# Patient Record
Sex: Female | Born: 1940 | Race: Black or African American | Hispanic: No | Marital: Single | Smoking: Former smoker
Health system: Southern US, Community
[De-identification: ages and names within clinical notes are randomized; demographics above are authoritative.]

## PROBLEM LIST (undated history)

## (undated) DIAGNOSIS — E039 Hypothyroidism, unspecified: Secondary | ICD-10-CM

## (undated) DIAGNOSIS — I10 Essential (primary) hypertension: Secondary | ICD-10-CM

## (undated) DIAGNOSIS — Z5189 Encounter for other specified aftercare: Secondary | ICD-10-CM

## (undated) DIAGNOSIS — R413 Other amnesia: Secondary | ICD-10-CM

## (undated) DIAGNOSIS — D469 Myelodysplastic syndrome, unspecified: Secondary | ICD-10-CM

## (undated) DIAGNOSIS — M1990 Unspecified osteoarthritis, unspecified site: Secondary | ICD-10-CM

## (undated) DIAGNOSIS — D649 Anemia, unspecified: Secondary | ICD-10-CM

## (undated) DIAGNOSIS — K219 Gastro-esophageal reflux disease without esophagitis: Secondary | ICD-10-CM

## (undated) DIAGNOSIS — K922 Gastrointestinal hemorrhage, unspecified: Secondary | ICD-10-CM

## (undated) DIAGNOSIS — E785 Hyperlipidemia, unspecified: Secondary | ICD-10-CM

## (undated) DIAGNOSIS — IMO0001 Reserved for inherently not codable concepts without codable children: Secondary | ICD-10-CM

## (undated) DIAGNOSIS — E559 Vitamin D deficiency, unspecified: Secondary | ICD-10-CM

## (undated) HISTORY — DX: Vitamin D deficiency, unspecified: E55.9

## (undated) HISTORY — DX: Other amnesia: R41.3

## (undated) HISTORY — DX: Hyperlipidemia, unspecified: E78.5

## (undated) HISTORY — DX: Unspecified osteoarthritis, unspecified site: M19.90

## (undated) HISTORY — PX: INGUINAL HERNIA REPAIR: SUR1180

## (undated) HISTORY — PX: UMBILICAL HERNIA REPAIR: SHX196

## (undated) HISTORY — PX: CARPAL TUNNEL RELEASE: SHX101

## (undated) HISTORY — DX: Gastrointestinal hemorrhage, unspecified: K92.2

## (undated) HISTORY — DX: Essential (primary) hypertension: I10

## (undated) HISTORY — PX: ROTATOR CUFF REPAIR: SHX139

## (undated) HISTORY — PX: DILATION AND CURETTAGE OF UTERUS: SHX78

## (undated) HISTORY — DX: Encounter for other specified aftercare: Z51.89

## (undated) HISTORY — DX: Reserved for inherently not codable concepts without codable children: IMO0001

## (undated) HISTORY — DX: Myelodysplastic syndrome, unspecified: D46.9

## (undated) HISTORY — DX: Anemia, unspecified: D64.9

---

## 2009-07-17 ENCOUNTER — Ambulatory Visit: Payer: Self-pay | Admitting: Oncology

## 2009-07-22 LAB — CBC WITH DIFFERENTIAL/PLATELET
Basophils Absolute: 0 10*3/uL (ref 0.0–0.1)
EOS%: 2.9 % (ref 0.0–7.0)
MCHC: 34 g/dL (ref 31.5–36.0)
MCV: 82.8 fL (ref 79.5–101.0)
MONO#: 0.3 10*3/uL (ref 0.1–0.9)
NEUT#: 2.5 10*3/uL (ref 1.5–6.5)
NEUT%: 51.3 % (ref 38.4–76.8)
Platelets: 235 10*3/uL (ref 145–400)
RBC: 3.66 10*6/uL — ABNORMAL LOW (ref 3.70–5.45)
RDW: 16.1 % — ABNORMAL HIGH (ref 11.2–14.5)
WBC: 4.8 10*3/uL (ref 3.9–10.3)

## 2009-07-24 LAB — COMPREHENSIVE METABOLIC PANEL
Albumin: 4.2 g/dL (ref 3.5–5.2)
BUN: 31 mg/dL — ABNORMAL HIGH (ref 6–23)
Calcium: 9.4 mg/dL (ref 8.4–10.5)
Chloride: 107 mEq/L (ref 96–112)
Glucose, Bld: 89 mg/dL (ref 70–99)
Potassium: 4.4 mEq/L (ref 3.5–5.3)

## 2009-07-24 LAB — SPEP & IFE WITH QIG
Albumin ELP: 52.3 % — ABNORMAL LOW (ref 55.8–66.1)
Beta 2: 6.4 % (ref 3.2–6.5)
IgA: 487 mg/dL — ABNORMAL HIGH (ref 68–378)
IgM, Serum: 65 mg/dL (ref 60–263)
Total Protein, Serum Electrophoresis: 8 g/dL (ref 6.0–8.3)

## 2009-07-24 LAB — IRON AND TIBC
Iron: 98 ug/dL (ref 42–145)
UIBC: 146 ug/dL

## 2009-07-29 ENCOUNTER — Encounter: Admission: RE | Admit: 2009-07-29 | Discharge: 2009-07-29 | Payer: Self-pay | Admitting: Family Medicine

## 2009-08-17 ENCOUNTER — Ambulatory Visit: Payer: Self-pay | Admitting: Oncology

## 2009-08-19 LAB — CBC WITH DIFFERENTIAL/PLATELET
Basophils Absolute: 0 10*3/uL (ref 0.0–0.1)
EOS%: 2.3 % (ref 0.0–7.0)
LYMPH%: 39.9 % (ref 14.0–49.7)
MCH: 29.8 pg (ref 25.1–34.0)
MCV: 86.4 fL (ref 79.5–101.0)
MONO%: 5.8 % (ref 0.0–14.0)
Platelets: 214 10*3/uL (ref 145–400)
RBC: 3.71 10*6/uL (ref 3.70–5.45)
RDW: 16.8 % — ABNORMAL HIGH (ref 11.2–14.5)

## 2009-09-14 ENCOUNTER — Ambulatory Visit: Payer: Self-pay | Admitting: Oncology

## 2009-09-16 LAB — CBC WITH DIFFERENTIAL/PLATELET
Basophils Absolute: 0 10*3/uL (ref 0.0–0.1)
EOS%: 3.8 % (ref 0.0–7.0)
Eosinophils Absolute: 0.2 10*3/uL (ref 0.0–0.5)
HCT: 26.7 % — ABNORMAL LOW (ref 34.8–46.6)
HGB: 9.3 g/dL — ABNORMAL LOW (ref 11.6–15.9)
MCH: 30.2 pg (ref 25.1–34.0)
MCV: 87 fL (ref 79.5–101.0)
MONO%: 7.4 % (ref 0.0–14.0)
NEUT%: 50.7 % (ref 38.4–76.8)
Platelets: 189 10*3/uL (ref 145–400)

## 2009-10-14 ENCOUNTER — Ambulatory Visit: Payer: Self-pay | Admitting: Oncology

## 2009-10-14 LAB — CBC WITH DIFFERENTIAL/PLATELET
BASO%: 0.2 % (ref 0.0–2.0)
Basophils Absolute: 0 10*3/uL (ref 0.0–0.1)
EOS%: 2.4 % (ref 0.0–7.0)
Eosinophils Absolute: 0.1 10*3/uL (ref 0.0–0.5)
HCT: 29.8 % — ABNORMAL LOW (ref 34.8–46.6)
HGB: 9.8 g/dL — ABNORMAL LOW (ref 11.6–15.9)
LYMPH%: 36.3 % (ref 14.0–49.7)
MCH: 28.7 pg (ref 25.1–34.0)
MCHC: 32.9 g/dL (ref 31.5–36.0)
MCV: 87.1 fL (ref 79.5–101.0)
MONO#: 0.3 10*3/uL (ref 0.1–0.9)
MONO%: 4.9 % (ref 0.0–14.0)
NEUT#: 3.2 10*3/uL (ref 1.5–6.5)
NEUT%: 56.2 % (ref 38.4–76.8)
Platelets: 222 10*3/uL (ref 145–400)
RBC: 3.42 10*6/uL — ABNORMAL LOW (ref 3.70–5.45)
RDW: 15.5 % — ABNORMAL HIGH (ref 11.2–14.5)
WBC: 5.7 10*3/uL (ref 3.9–10.3)
lymph#: 2.1 10*3/uL (ref 0.9–3.3)
nRBC: 0 % (ref 0–0)

## 2009-11-11 LAB — CBC WITH DIFFERENTIAL/PLATELET
Basophils Absolute: 0 10*3/uL (ref 0.0–0.1)
Eosinophils Absolute: 0.1 10*3/uL (ref 0.0–0.5)
HGB: 10.6 g/dL — ABNORMAL LOW (ref 11.6–15.9)
LYMPH%: 39.8 % (ref 14.0–49.7)
MCV: 87.2 fL (ref 79.5–101.0)
MONO%: 6.5 % (ref 0.0–14.0)
NEUT#: 2.3 10*3/uL (ref 1.5–6.5)
Platelets: 180 10*3/uL (ref 145–400)

## 2009-12-07 ENCOUNTER — Ambulatory Visit: Payer: Self-pay | Admitting: Oncology

## 2009-12-09 LAB — CBC WITH DIFFERENTIAL/PLATELET
Basophils Absolute: 0 10*3/uL (ref 0.0–0.1)
Eosinophils Absolute: 0.1 10*3/uL (ref 0.0–0.5)
HGB: 11.4 g/dL — ABNORMAL LOW (ref 11.6–15.9)
MONO%: 7.4 % (ref 0.0–14.0)
NEUT#: 2.5 10*3/uL (ref 1.5–6.5)
RBC: 3.84 10*6/uL (ref 3.70–5.45)
RDW: 15.1 % — ABNORMAL HIGH (ref 11.2–14.5)
WBC: 5 10*3/uL (ref 3.9–10.3)
lymph#: 2 10*3/uL (ref 0.9–3.3)

## 2009-12-12 ENCOUNTER — Emergency Department (HOSPITAL_COMMUNITY): Admission: EM | Admit: 2009-12-12 | Discharge: 2009-12-13 | Payer: Self-pay | Admitting: Emergency Medicine

## 2010-01-06 ENCOUNTER — Ambulatory Visit: Payer: Self-pay | Admitting: Oncology

## 2010-01-06 LAB — CBC WITH DIFFERENTIAL/PLATELET
BASO%: 0.4 % (ref 0.0–2.0)
EOS%: 2.6 % (ref 0.0–7.0)
HGB: 9.3 g/dL — ABNORMAL LOW (ref 11.6–15.9)
MCH: 29.6 pg (ref 25.1–34.0)
MCHC: 35.1 g/dL (ref 31.5–36.0)
MCV: 84.1 fL (ref 79.5–101.0)
MONO%: 6 % (ref 0.0–14.0)
RBC: 3.15 10*6/uL — ABNORMAL LOW (ref 3.70–5.45)
RDW: 14.5 % (ref 11.2–14.5)
lymph#: 1.5 10*3/uL (ref 0.9–3.3)

## 2010-02-03 LAB — CBC WITH DIFFERENTIAL/PLATELET
BASO%: 0.4 % (ref 0.0–2.0)
EOS%: 2.7 % (ref 0.0–7.0)
HCT: 27.8 % — ABNORMAL LOW (ref 34.8–46.6)
LYMPH%: 37.9 % (ref 14.0–49.7)
MCH: 29.8 pg (ref 25.1–34.0)
MCHC: 34.6 g/dL (ref 31.5–36.0)
MCV: 86.1 fL (ref 79.5–101.0)
MONO%: 7.1 % (ref 0.0–14.0)
NEUT%: 51.9 % (ref 38.4–76.8)
lymph#: 1.7 10*3/uL (ref 0.9–3.3)

## 2010-03-02 ENCOUNTER — Ambulatory Visit: Payer: Self-pay | Admitting: Oncology

## 2010-03-03 ENCOUNTER — Emergency Department (HOSPITAL_COMMUNITY): Admission: EM | Admit: 2010-03-03 | Discharge: 2010-03-03 | Payer: Self-pay | Admitting: Emergency Medicine

## 2010-03-03 LAB — CBC WITH DIFFERENTIAL/PLATELET
Eosinophils Absolute: 0.2 10*3/uL (ref 0.0–0.5)
HCT: 29.7 % — ABNORMAL LOW (ref 34.8–46.6)
HGB: 10.3 g/dL — ABNORMAL LOW (ref 11.6–15.9)
LYMPH%: 47.5 % (ref 14.0–49.7)
MONO#: 0.3 10*3/uL (ref 0.1–0.9)
NEUT#: 2.1 10*3/uL (ref 1.5–6.5)
NEUT%: 42.4 % (ref 38.4–76.8)
Platelets: 232 10*3/uL (ref 145–400)
WBC: 4.9 10*3/uL (ref 3.9–10.3)

## 2010-04-01 ENCOUNTER — Ambulatory Visit: Payer: Self-pay | Admitting: Oncology

## 2010-04-01 LAB — CBC WITH DIFFERENTIAL/PLATELET
BASO%: 0.5 % (ref 0.0–2.0)
Basophils Absolute: 0 10*3/uL (ref 0.0–0.1)
HCT: 31.6 % — ABNORMAL LOW (ref 34.8–46.6)
LYMPH%: 47 % (ref 14.0–49.7)
MCH: 29.7 pg (ref 25.1–34.0)
MCHC: 34.8 g/dL (ref 31.5–36.0)
MONO#: 0.4 10*3/uL (ref 0.1–0.9)
NEUT%: 42.2 % (ref 38.4–76.8)
Platelets: 189 10*3/uL (ref 145–400)

## 2010-04-28 LAB — CBC WITH DIFFERENTIAL/PLATELET
BASO%: 0.4 % (ref 0.0–2.0)
Eosinophils Absolute: 0.1 10*3/uL (ref 0.0–0.5)
HCT: 29.9 % — ABNORMAL LOW (ref 34.8–46.6)
LYMPH%: 37.5 % (ref 14.0–49.7)
MCHC: 34.1 g/dL (ref 31.5–36.0)
MONO#: 0.3 10*3/uL (ref 0.1–0.9)
NEUT%: 52.3 % (ref 38.4–76.8)
Platelets: 198 10*3/uL (ref 145–400)
WBC: 4.1 10*3/uL (ref 3.9–10.3)

## 2010-04-28 LAB — COMPREHENSIVE METABOLIC PANEL
CO2: 20 mEq/L (ref 19–32)
Creatinine, Ser: 1.11 mg/dL (ref 0.40–1.20)
Glucose, Bld: 141 mg/dL — ABNORMAL HIGH (ref 70–99)
Total Bilirubin: 0.5 mg/dL (ref 0.3–1.2)

## 2010-05-24 ENCOUNTER — Ambulatory Visit: Payer: Self-pay | Admitting: Oncology

## 2010-05-26 LAB — CBC WITH DIFFERENTIAL/PLATELET
Basophils Absolute: 0 10*3/uL (ref 0.0–0.1)
Eosinophils Absolute: 0.2 10*3/uL (ref 0.0–0.5)
HCT: 30.3 % — ABNORMAL LOW (ref 34.8–46.6)
HGB: 10.6 g/dL — ABNORMAL LOW (ref 11.6–15.9)
LYMPH%: 50.3 % — ABNORMAL HIGH (ref 14.0–49.7)
MONO#: 0.2 10*3/uL (ref 0.1–0.9)
NEUT#: 1.7 10*3/uL (ref 1.5–6.5)
Platelets: 182 10*3/uL (ref 145–400)
RBC: 3.59 10*6/uL — ABNORMAL LOW (ref 3.70–5.45)
WBC: 4.3 10*3/uL (ref 3.9–10.3)

## 2010-06-24 ENCOUNTER — Ambulatory Visit: Payer: Self-pay | Admitting: Oncology

## 2010-06-24 LAB — CBC WITH DIFFERENTIAL/PLATELET
Eosinophils Absolute: 0.1 10*3/uL (ref 0.0–0.5)
HCT: 29 % — ABNORMAL LOW (ref 34.8–46.6)
LYMPH%: 36.2 % (ref 14.0–49.7)
MCV: 84 fL (ref 79.5–101.0)
MONO#: 0.3 10*3/uL (ref 0.1–0.9)
NEUT#: 2.3 10*3/uL (ref 1.5–6.5)
NEUT%: 54.5 % (ref 38.4–76.8)
Platelets: 229 10*3/uL (ref 145–400)
WBC: 4.2 10*3/uL (ref 3.9–10.3)

## 2010-06-24 LAB — COMPREHENSIVE METABOLIC PANEL
BUN: 32 mg/dL — ABNORMAL HIGH (ref 6–23)
CO2: 22 mEq/L (ref 19–32)
Creatinine, Ser: 1.11 mg/dL (ref 0.40–1.20)
Glucose, Bld: 127 mg/dL — ABNORMAL HIGH (ref 70–99)
Total Bilirubin: 0.5 mg/dL (ref 0.3–1.2)
Total Protein: 7.9 g/dL (ref 6.0–8.3)

## 2010-07-22 LAB — CBC WITH DIFFERENTIAL/PLATELET
BASO%: 0.1 % (ref 0.0–2.0)
Basophils Absolute: 0 10*3/uL (ref 0.0–0.1)
EOS%: 3 % (ref 0.0–7.0)
Eosinophils Absolute: 0.1 10*3/uL (ref 0.0–0.5)
HCT: 28.7 % — ABNORMAL LOW (ref 34.8–46.6)
HGB: 10 g/dL — ABNORMAL LOW (ref 11.6–15.9)
LYMPH%: 37.3 % (ref 14.0–49.7)
MCH: 28.9 pg (ref 25.1–34.0)
MCHC: 34.9 g/dL (ref 31.5–36.0)
MCV: 83 fL (ref 79.5–101.0)
MONO#: 0.4 10*3/uL (ref 0.1–0.9)
MONO%: 8.5 % (ref 0.0–14.0)
NEUT#: 2.2 10*3/uL (ref 1.5–6.5)
NEUT%: 51.1 % (ref 38.4–76.8)
Platelets: 230 10*3/uL (ref 145–400)
RBC: 3.46 10*6/uL — ABNORMAL LOW (ref 3.70–5.45)
RDW: 17.4 % — ABNORMAL HIGH (ref 11.2–14.5)
WBC: 4.3 10*3/uL (ref 3.9–10.3)
lymph#: 1.6 10*3/uL (ref 0.9–3.3)

## 2010-08-17 ENCOUNTER — Ambulatory Visit: Payer: Self-pay | Admitting: Oncology

## 2010-08-19 LAB — CBC WITH DIFFERENTIAL/PLATELET
BASO%: 0.1 % (ref 0.0–2.0)
EOS%: 2.5 % (ref 0.0–7.0)
HCT: 30.2 % — ABNORMAL LOW (ref 34.8–46.6)
MCH: 27.9 pg (ref 25.1–34.0)
MCHC: 34.3 g/dL (ref 31.5–36.0)
MONO#: 0.3 10*3/uL (ref 0.1–0.9)
RBC: 3.71 10*6/uL (ref 3.70–5.45)
RDW: 17.3 % — ABNORMAL HIGH (ref 11.2–14.5)
WBC: 3.9 10*3/uL (ref 3.9–10.3)
lymph#: 1.7 10*3/uL (ref 0.9–3.3)

## 2010-08-23 LAB — COMPREHENSIVE METABOLIC PANEL
ALT: 10 U/L (ref 0–35)
AST: 17 U/L (ref 0–37)
CO2: 23 mEq/L (ref 19–32)
Calcium: 9.3 mg/dL (ref 8.4–10.5)
Chloride: 109 mEq/L (ref 96–112)
Potassium: 4.1 mEq/L (ref 3.5–5.3)
Sodium: 142 mEq/L (ref 135–145)
Total Protein: 7.5 g/dL (ref 6.0–8.3)

## 2010-08-23 LAB — SPEP & IFE WITH QIG
Albumin ELP: 50.2 % — ABNORMAL LOW (ref 55.8–66.1)
Alpha-1-Globulin: 4.3 % (ref 2.9–4.9)
Beta 2: 7 % — ABNORMAL HIGH (ref 3.2–6.5)
Gamma Globulin: 23.1 % — ABNORMAL HIGH (ref 11.1–18.8)
IgA: 380 mg/dL — ABNORMAL HIGH (ref 68–378)
IgM, Serum: 53 mg/dL — ABNORMAL LOW (ref 60–263)

## 2010-08-23 LAB — IRON AND TIBC
%SAT: 42 % (ref 20–55)
TIBC: 233 ug/dL — ABNORMAL LOW (ref 250–470)

## 2010-08-23 LAB — FERRITIN: Ferritin: 428 ng/mL — ABNORMAL HIGH (ref 10–291)

## 2010-09-09 LAB — CBC WITH DIFFERENTIAL/PLATELET
Eosinophils Absolute: 0.1 10*3/uL (ref 0.0–0.5)
HCT: 31.2 % — ABNORMAL LOW (ref 34.8–46.6)
LYMPH%: 42.6 % (ref 14.0–49.7)
MCHC: 33.8 g/dL (ref 31.5–36.0)
MCV: 81.3 fL (ref 79.5–101.0)
MONO%: 5.1 % (ref 0.0–14.0)
NEUT#: 2.2 10*3/uL (ref 1.5–6.5)
NEUT%: 49.4 % (ref 38.4–76.8)
Platelets: 193 10*3/uL (ref 145–400)
RBC: 3.83 10*6/uL (ref 3.70–5.45)

## 2010-09-28 ENCOUNTER — Ambulatory Visit: Payer: Self-pay | Admitting: Oncology

## 2010-09-30 LAB — CBC WITH DIFFERENTIAL/PLATELET
BASO%: 0.1 % (ref 0.0–2.0)
EOS%: 2.7 % (ref 0.0–7.0)
LYMPH%: 36.6 % (ref 14.0–49.7)
MCH: 27.5 pg (ref 25.1–34.0)
MCHC: 34.4 g/dL (ref 31.5–36.0)
MONO#: 0.3 10*3/uL (ref 0.1–0.9)
MONO%: 7.2 % (ref 0.0–14.0)
NEUT%: 53.4 % (ref 38.4–76.8)
Platelets: 216 10*3/uL (ref 145–400)
RBC: 3.4 10*6/uL — ABNORMAL LOW (ref 3.70–5.45)
WBC: 4 10*3/uL (ref 3.9–10.3)

## 2010-10-21 LAB — CBC WITH DIFFERENTIAL/PLATELET
BASO%: 0.2 % (ref 0.0–2.0)
Basophils Absolute: 0 10*3/uL (ref 0.0–0.1)
EOS%: 1.5 % (ref 0.0–7.0)
Eosinophils Absolute: 0.1 10*3/uL (ref 0.0–0.5)
HCT: 30.1 % — ABNORMAL LOW (ref 34.8–46.6)
HGB: 10.3 g/dL — ABNORMAL LOW (ref 11.6–15.9)
LYMPH%: 29.6 % (ref 14.0–49.7)
MCH: 28.1 pg (ref 25.1–34.0)
MCHC: 34.3 g/dL (ref 31.5–36.0)
MCV: 81.8 fL (ref 79.5–101.0)
MONO#: 0.3 10*3/uL (ref 0.1–0.9)
MONO%: 8.2 % (ref 0.0–14.0)
NEUT#: 2.5 10*3/uL (ref 1.5–6.5)
NEUT%: 60.5 % (ref 38.4–76.8)
Platelets: 199 10*3/uL (ref 145–400)
RBC: 3.68 10*6/uL — ABNORMAL LOW (ref 3.70–5.45)
RDW: 17.9 % — ABNORMAL HIGH (ref 11.2–14.5)
WBC: 4.1 10*3/uL (ref 3.9–10.3)
lymph#: 1.2 10*3/uL (ref 0.9–3.3)

## 2010-11-11 ENCOUNTER — Encounter (HOSPITAL_BASED_OUTPATIENT_CLINIC_OR_DEPARTMENT_OTHER): Payer: Medicare Other | Admitting: Oncology

## 2010-11-11 ENCOUNTER — Other Ambulatory Visit: Payer: Self-pay | Admitting: Oncology

## 2010-11-11 DIAGNOSIS — D539 Nutritional anemia, unspecified: Secondary | ICD-10-CM

## 2010-11-11 DIAGNOSIS — N289 Disorder of kidney and ureter, unspecified: Secondary | ICD-10-CM

## 2010-11-11 DIAGNOSIS — D649 Anemia, unspecified: Secondary | ICD-10-CM

## 2010-11-11 LAB — CBC WITH DIFFERENTIAL/PLATELET
Basophils Absolute: 0 10*3/uL (ref 0.0–0.1)
Eosinophils Absolute: 0.1 10*3/uL (ref 0.0–0.5)
HCT: 33.7 % — ABNORMAL LOW (ref 34.8–46.6)
HGB: 11.3 g/dL — ABNORMAL LOW (ref 11.6–15.9)
MCV: 80.4 fL (ref 79.5–101.0)
MONO%: 6.5 % (ref 0.0–14.0)
NEUT#: 2.1 10*3/uL (ref 1.5–6.5)
NEUT%: 51.1 % (ref 38.4–76.8)
RDW: 16.1 % — ABNORMAL HIGH (ref 11.2–14.5)
lymph#: 1.6 10*3/uL (ref 0.9–3.3)

## 2010-12-02 ENCOUNTER — Other Ambulatory Visit: Payer: Self-pay | Admitting: Oncology

## 2010-12-02 ENCOUNTER — Encounter (HOSPITAL_BASED_OUTPATIENT_CLINIC_OR_DEPARTMENT_OTHER): Payer: Medicare Other | Admitting: Oncology

## 2010-12-02 DIAGNOSIS — D649 Anemia, unspecified: Secondary | ICD-10-CM

## 2010-12-02 DIAGNOSIS — N289 Disorder of kidney and ureter, unspecified: Secondary | ICD-10-CM

## 2010-12-02 DIAGNOSIS — D539 Nutritional anemia, unspecified: Secondary | ICD-10-CM

## 2010-12-02 LAB — CBC WITH DIFFERENTIAL/PLATELET
BASO%: 0.3 % (ref 0.0–2.0)
EOS%: 2.8 % (ref 0.0–7.0)
HCT: 28.1 % — ABNORMAL LOW (ref 34.8–46.6)
MCH: 27.9 pg (ref 25.1–34.0)
MCHC: 34.4 g/dL (ref 31.5–36.0)
MONO#: 0.3 10*3/uL (ref 0.1–0.9)
RDW: 15.5 % — ABNORMAL HIGH (ref 11.2–14.5)
WBC: 3.9 10*3/uL (ref 3.9–10.3)
lymph#: 1.8 10*3/uL (ref 0.9–3.3)

## 2010-12-20 LAB — URINALYSIS, ROUTINE W REFLEX MICROSCOPIC
Nitrite: NEGATIVE
Specific Gravity, Urine: 1.019 (ref 1.005–1.030)
Urobilinogen, UA: 0.2 mg/dL (ref 0.0–1.0)
pH: 6 (ref 5.0–8.0)

## 2010-12-20 LAB — URINE MICROSCOPIC-ADD ON

## 2010-12-20 LAB — URINE CULTURE: Colony Count: 40000

## 2010-12-23 ENCOUNTER — Other Ambulatory Visit: Payer: Self-pay | Admitting: Medical

## 2010-12-23 ENCOUNTER — Encounter (HOSPITAL_BASED_OUTPATIENT_CLINIC_OR_DEPARTMENT_OTHER): Payer: Medicare Other | Admitting: Oncology

## 2010-12-23 DIAGNOSIS — N289 Disorder of kidney and ureter, unspecified: Secondary | ICD-10-CM

## 2010-12-23 DIAGNOSIS — D649 Anemia, unspecified: Secondary | ICD-10-CM

## 2010-12-23 LAB — CBC WITH DIFFERENTIAL/PLATELET
BASO%: 0.9 % (ref 0.0–2.0)
HCT: 30.1 % — ABNORMAL LOW (ref 34.8–46.6)
LYMPH%: 54.3 % — ABNORMAL HIGH (ref 14.0–49.7)
MCHC: 34.4 g/dL (ref 31.5–36.0)
MCV: 82.9 fL (ref 79.5–101.0)
MONO#: 0.2 10*3/uL (ref 0.1–0.9)
MONO%: 5.3 % (ref 0.0–14.0)
NEUT%: 37.1 % — ABNORMAL LOW (ref 38.4–76.8)
Platelets: 184 10*3/uL (ref 145–400)
RBC: 3.64 10*6/uL — ABNORMAL LOW (ref 3.70–5.45)

## 2010-12-27 LAB — DIFFERENTIAL
Basophils Absolute: 0 10*3/uL (ref 0.0–0.1)
Eosinophils Absolute: 0.1 10*3/uL (ref 0.0–0.7)
Eosinophils Relative: 2 % (ref 0–5)
Lymphocytes Relative: 49 % — ABNORMAL HIGH (ref 12–46)
Lymphs Abs: 2.7 10*3/uL (ref 0.7–4.0)
Neutrophils Relative %: 42 % — ABNORMAL LOW (ref 43–77)

## 2010-12-27 LAB — CBC
HCT: 32.7 % — ABNORMAL LOW (ref 36.0–46.0)
MCV: 87.6 fL (ref 78.0–100.0)
Platelets: 204 10*3/uL (ref 150–400)
RDW: 15.6 % — ABNORMAL HIGH (ref 11.5–15.5)
WBC: 5.6 10*3/uL (ref 4.0–10.5)

## 2010-12-27 LAB — POCT CARDIAC MARKERS
CKMB, poc: <1 ng/mL — ABNORMAL LOW (ref 1.0–8.0)
Myoglobin, poc: 97.3 ng/mL (ref 12–200)
Troponin i, poc: <0.05 ng/mL (ref 0.00–0.09)

## 2010-12-27 LAB — POCT I-STAT, CHEM 8
Hemoglobin: 10.9 g/dL — ABNORMAL LOW (ref 12.0–15.0)
Potassium: 6.9 mEq/L (ref 3.5–5.1)
Sodium: 137 mEq/L (ref 135–145)
TCO2: 23 mmol/L (ref 0–100)

## 2011-01-13 ENCOUNTER — Encounter (HOSPITAL_BASED_OUTPATIENT_CLINIC_OR_DEPARTMENT_OTHER): Payer: Medicare Other | Admitting: Oncology

## 2011-01-13 ENCOUNTER — Other Ambulatory Visit: Payer: Self-pay | Admitting: Oncology

## 2011-01-13 DIAGNOSIS — D539 Nutritional anemia, unspecified: Secondary | ICD-10-CM

## 2011-01-13 LAB — CBC WITH DIFFERENTIAL/PLATELET
BASO%: 0.4 % (ref 0.0–2.0)
Basophils Absolute: 0 10*3/uL (ref 0.0–0.1)
Eosinophils Absolute: 0.1 10*3/uL (ref 0.0–0.5)
HCT: 32.8 % — ABNORMAL LOW (ref 34.8–46.6)
HGB: 11.2 g/dL — ABNORMAL LOW (ref 11.6–15.9)
MONO#: 0.3 10*3/uL (ref 0.1–0.9)
NEUT#: 1.6 10*3/uL (ref 1.5–6.5)
NEUT%: 47.4 % (ref 38.4–76.8)
Platelets: 184 10*3/uL (ref 145–400)
WBC: 3.4 10*3/uL — ABNORMAL LOW (ref 3.9–10.3)
lymph#: 1.5 10*3/uL (ref 0.9–3.3)

## 2011-02-03 ENCOUNTER — Other Ambulatory Visit: Payer: Self-pay | Admitting: Oncology

## 2011-02-03 ENCOUNTER — Encounter (HOSPITAL_BASED_OUTPATIENT_CLINIC_OR_DEPARTMENT_OTHER): Payer: Medicare Other | Admitting: Oncology

## 2011-02-03 DIAGNOSIS — N289 Disorder of kidney and ureter, unspecified: Secondary | ICD-10-CM

## 2011-02-03 DIAGNOSIS — D539 Nutritional anemia, unspecified: Secondary | ICD-10-CM

## 2011-02-03 DIAGNOSIS — D649 Anemia, unspecified: Secondary | ICD-10-CM

## 2011-02-03 LAB — CBC WITH DIFFERENTIAL/PLATELET
Basophils Absolute: 0 10*3/uL (ref 0.0–0.1)
EOS%: 2.3 % (ref 0.0–7.0)
HCT: 26.9 % — ABNORMAL LOW (ref 34.8–46.6)
HGB: 9.3 g/dL — ABNORMAL LOW (ref 11.6–15.9)
LYMPH%: 51.5 % — ABNORMAL HIGH (ref 14.0–49.7)
MCH: 28.6 pg (ref 25.1–34.0)
NEUT%: 37.8 % — ABNORMAL LOW (ref 38.4–76.8)
Platelets: 186 10*3/uL (ref 145–400)
lymph#: 2.2 10*3/uL (ref 0.9–3.3)

## 2011-02-24 ENCOUNTER — Encounter (HOSPITAL_BASED_OUTPATIENT_CLINIC_OR_DEPARTMENT_OTHER): Payer: Medicare Other | Admitting: Oncology

## 2011-02-24 ENCOUNTER — Other Ambulatory Visit: Payer: Self-pay | Admitting: Oncology

## 2011-02-24 DIAGNOSIS — N289 Disorder of kidney and ureter, unspecified: Secondary | ICD-10-CM

## 2011-02-24 DIAGNOSIS — D539 Nutritional anemia, unspecified: Secondary | ICD-10-CM

## 2011-02-24 DIAGNOSIS — D649 Anemia, unspecified: Secondary | ICD-10-CM

## 2011-02-24 LAB — CBC WITH DIFFERENTIAL/PLATELET
Basophils Absolute: 0 10*3/uL (ref 0.0–0.1)
EOS%: 0 % (ref 0.0–7.0)
HCT: 30.1 % — ABNORMAL LOW (ref 34.8–46.6)
HGB: 10.4 g/dL — ABNORMAL LOW (ref 11.6–15.9)
MCH: 29.1 pg (ref 25.1–34.0)
MCV: 84.6 fL (ref 79.5–101.0)
MONO%: 0.8 % (ref 0.0–14.0)
NEUT%: 74.9 % (ref 38.4–76.8)
RDW: 17.4 % — ABNORMAL HIGH (ref 11.2–14.5)

## 2011-03-17 ENCOUNTER — Other Ambulatory Visit: Payer: Self-pay | Admitting: Oncology

## 2011-03-17 ENCOUNTER — Encounter (HOSPITAL_BASED_OUTPATIENT_CLINIC_OR_DEPARTMENT_OTHER): Payer: Medicare Other | Admitting: Oncology

## 2011-03-17 DIAGNOSIS — D539 Nutritional anemia, unspecified: Secondary | ICD-10-CM

## 2011-03-17 LAB — CBC WITH DIFFERENTIAL/PLATELET
BASO%: 0.4 % (ref 0.0–2.0)
EOS%: 2.3 % (ref 0.0–7.0)
MCH: 29.3 pg (ref 25.1–34.0)
MCV: 86 fL (ref 79.5–101.0)
MONO%: 5.3 % (ref 0.0–14.0)
RBC: 3.92 10*6/uL (ref 3.70–5.45)
RDW: 17.4 % — ABNORMAL HIGH (ref 11.2–14.5)

## 2011-04-05 ENCOUNTER — Encounter (HOSPITAL_BASED_OUTPATIENT_CLINIC_OR_DEPARTMENT_OTHER): Payer: Medicare Other | Admitting: Oncology

## 2011-04-05 ENCOUNTER — Other Ambulatory Visit: Payer: Self-pay | Admitting: Oncology

## 2011-04-05 DIAGNOSIS — D638 Anemia in other chronic diseases classified elsewhere: Secondary | ICD-10-CM

## 2011-04-05 DIAGNOSIS — D539 Nutritional anemia, unspecified: Secondary | ICD-10-CM

## 2011-04-05 DIAGNOSIS — N289 Disorder of kidney and ureter, unspecified: Secondary | ICD-10-CM

## 2011-04-05 LAB — CBC WITH DIFFERENTIAL/PLATELET
Eosinophils Absolute: 0.1 10*3/uL (ref 0.0–0.5)
MONO#: 0.2 10*3/uL (ref 0.1–0.9)
MONO%: 4.7 % (ref 0.0–14.0)
NEUT#: 2 10*3/uL (ref 1.5–6.5)
RBC: 3.76 10*6/uL (ref 3.70–5.45)
RDW: 15.5 % — ABNORMAL HIGH (ref 11.2–14.5)
WBC: 4.9 10*3/uL (ref 3.9–10.3)
nRBC: 0 % (ref 0–0)

## 2011-04-28 ENCOUNTER — Other Ambulatory Visit: Payer: Self-pay | Admitting: Oncology

## 2011-04-28 ENCOUNTER — Encounter (HOSPITAL_BASED_OUTPATIENT_CLINIC_OR_DEPARTMENT_OTHER): Payer: Medicare Other | Admitting: Oncology

## 2011-04-28 DIAGNOSIS — D539 Nutritional anemia, unspecified: Secondary | ICD-10-CM

## 2011-04-28 LAB — CBC WITH DIFFERENTIAL/PLATELET
BASO%: 0.4 % (ref 0.0–2.0)
HCT: 32.8 % — ABNORMAL LOW (ref 34.8–46.6)
HGB: 11.1 g/dL — ABNORMAL LOW (ref 11.6–15.9)
MCHC: 34 g/dL (ref 31.5–36.0)
MONO#: 0.3 10*3/uL (ref 0.1–0.9)
NEUT%: 47.1 % (ref 38.4–76.8)
WBC: 4.3 10*3/uL (ref 3.9–10.3)
lymph#: 1.9 10*3/uL (ref 0.9–3.3)

## 2011-05-19 ENCOUNTER — Other Ambulatory Visit: Payer: Self-pay | Admitting: Oncology

## 2011-05-19 ENCOUNTER — Encounter (HOSPITAL_BASED_OUTPATIENT_CLINIC_OR_DEPARTMENT_OTHER): Payer: Medicare Other | Admitting: Oncology

## 2011-05-19 DIAGNOSIS — N289 Disorder of kidney and ureter, unspecified: Secondary | ICD-10-CM

## 2011-05-19 DIAGNOSIS — D539 Nutritional anemia, unspecified: Secondary | ICD-10-CM

## 2011-05-19 DIAGNOSIS — D649 Anemia, unspecified: Secondary | ICD-10-CM

## 2011-05-19 LAB — CBC WITH DIFFERENTIAL/PLATELET
Basophils Absolute: 0 10*3/uL (ref 0.0–0.1)
Eosinophils Absolute: 0.1 10*3/uL (ref 0.0–0.5)
HCT: 30.5 % — ABNORMAL LOW (ref 34.8–46.6)
HGB: 10.5 g/dL — ABNORMAL LOW (ref 11.6–15.9)
MCH: 30 pg (ref 25.1–34.0)
MONO#: 0.3 10*3/uL (ref 0.1–0.9)
NEUT#: 1.9 10*3/uL (ref 1.5–6.5)
NEUT%: 41.1 % (ref 38.4–76.8)
WBC: 4.6 10*3/uL (ref 3.9–10.3)
lymph#: 2.3 10*3/uL (ref 0.9–3.3)

## 2011-06-09 ENCOUNTER — Other Ambulatory Visit: Payer: Self-pay | Admitting: Oncology

## 2011-06-09 ENCOUNTER — Encounter: Payer: Medicare Other | Admitting: Oncology

## 2011-06-09 LAB — CBC WITH DIFFERENTIAL/PLATELET
Basophils Absolute: 0 10*3/uL (ref 0.0–0.1)
EOS%: 2.4 % (ref 0.0–7.0)
HGB: 11.3 g/dL — ABNORMAL LOW (ref 11.6–15.9)
MCH: 30.5 pg (ref 25.1–34.0)
NEUT#: 1.5 10*3/uL (ref 1.5–6.5)
RBC: 3.72 10*6/uL (ref 3.70–5.45)
RDW: 16.4 % — ABNORMAL HIGH (ref 11.2–14.5)
lymph#: 2.1 10*3/uL (ref 0.9–3.3)

## 2011-06-30 ENCOUNTER — Encounter (HOSPITAL_BASED_OUTPATIENT_CLINIC_OR_DEPARTMENT_OTHER): Payer: Medicare Other | Admitting: Oncology

## 2011-06-30 ENCOUNTER — Other Ambulatory Visit: Payer: Self-pay | Admitting: Medical

## 2011-06-30 DIAGNOSIS — D649 Anemia, unspecified: Secondary | ICD-10-CM

## 2011-06-30 DIAGNOSIS — N289 Disorder of kidney and ureter, unspecified: Secondary | ICD-10-CM

## 2011-06-30 DIAGNOSIS — I10 Essential (primary) hypertension: Secondary | ICD-10-CM

## 2011-06-30 LAB — CBC WITH DIFFERENTIAL/PLATELET
BASO%: 0.3 % (ref 0.0–2.0)
EOS%: 2.5 % (ref 0.0–7.0)
HCT: 29.8 % — ABNORMAL LOW (ref 34.8–46.6)
HGB: 10.3 g/dL — ABNORMAL LOW (ref 11.6–15.9)
MCV: 88 fL (ref 79.5–101.0)
NEUT#: 2.1 10*3/uL (ref 1.5–6.5)
Platelets: 155 10*3/uL (ref 145–400)
RBC: 3.39 10*6/uL — ABNORMAL LOW (ref 3.70–5.45)
WBC: 4.7 10*3/uL (ref 3.9–10.3)
lymph#: 2.1 10*3/uL (ref 0.9–3.3)

## 2011-07-21 ENCOUNTER — Encounter (HOSPITAL_BASED_OUTPATIENT_CLINIC_OR_DEPARTMENT_OTHER): Payer: Medicare Other | Admitting: Oncology

## 2011-07-21 ENCOUNTER — Other Ambulatory Visit: Payer: Self-pay | Admitting: Oncology

## 2011-07-21 DIAGNOSIS — D539 Nutritional anemia, unspecified: Secondary | ICD-10-CM

## 2011-07-21 LAB — CBC WITH DIFFERENTIAL/PLATELET
Basophils Absolute: 0 10*3/uL (ref 0.0–0.1)
EOS%: 2 % (ref 0.0–7.0)
Eosinophils Absolute: 0.1 10*3/uL (ref 0.0–0.5)
HGB: 11.2 g/dL — ABNORMAL LOW (ref 11.6–15.9)
MCH: 30.5 pg (ref 25.1–34.0)
NEUT#: 1.8 10*3/uL (ref 1.5–6.5)
RDW: 16.4 % — ABNORMAL HIGH (ref 11.2–14.5)
lymph#: 1.8 10*3/uL (ref 0.9–3.3)

## 2011-08-10 ENCOUNTER — Other Ambulatory Visit: Payer: Self-pay | Admitting: Oncology

## 2011-08-10 DIAGNOSIS — D649 Anemia, unspecified: Secondary | ICD-10-CM

## 2011-08-11 ENCOUNTER — Ambulatory Visit: Payer: Medicare Other

## 2011-08-11 ENCOUNTER — Other Ambulatory Visit: Payer: Self-pay | Admitting: Oncology

## 2011-08-11 ENCOUNTER — Other Ambulatory Visit (HOSPITAL_BASED_OUTPATIENT_CLINIC_OR_DEPARTMENT_OTHER): Payer: Medicare Other

## 2011-08-11 DIAGNOSIS — D649 Anemia, unspecified: Secondary | ICD-10-CM

## 2011-08-11 DIAGNOSIS — N289 Disorder of kidney and ureter, unspecified: Secondary | ICD-10-CM

## 2011-08-11 LAB — CBC WITH DIFFERENTIAL/PLATELET
Basophils Absolute: 0 10*3/uL (ref 0.0–0.1)
Eosinophils Absolute: 0.1 10*3/uL (ref 0.0–0.5)
HGB: 11.1 g/dL — ABNORMAL LOW (ref 11.6–15.9)
MCV: 88.2 fL (ref 79.5–101.0)
MONO#: 0.5 10*3/uL (ref 0.1–0.9)
MONO%: 8.9 % (ref 0.0–14.0)
NEUT#: 3.2 10*3/uL (ref 1.5–6.5)
RDW: 15.2 % — ABNORMAL HIGH (ref 11.2–14.5)
WBC: 5.9 10*3/uL (ref 3.9–10.3)

## 2011-08-11 MED ORDER — DARBEPOETIN ALFA-POLYSORBATE 500 MCG/ML IJ SOLN: 300.0000 ug | Freq: Once | INTRAMUSCULAR | Status: DC

## 2011-09-01 ENCOUNTER — Other Ambulatory Visit: Payer: Self-pay | Admitting: Oncology

## 2011-09-01 ENCOUNTER — Other Ambulatory Visit (HOSPITAL_BASED_OUTPATIENT_CLINIC_OR_DEPARTMENT_OTHER): Payer: Medicare Other | Admitting: Lab

## 2011-09-01 ENCOUNTER — Ambulatory Visit (HOSPITAL_BASED_OUTPATIENT_CLINIC_OR_DEPARTMENT_OTHER): Payer: Medicare Other

## 2011-09-01 VITALS — BP 149/70 | HR 59 | Temp 97.8°F

## 2011-09-01 DIAGNOSIS — D649 Anemia, unspecified: Secondary | ICD-10-CM

## 2011-09-01 DIAGNOSIS — N289 Disorder of kidney and ureter, unspecified: Secondary | ICD-10-CM

## 2011-09-01 LAB — CBC WITH DIFFERENTIAL/PLATELET
Basophils Absolute: 0 10*3/uL (ref 0.0–0.1)
EOS%: 1.8 % (ref 0.0–7.0)
Eosinophils Absolute: 0.1 10*3/uL (ref 0.0–0.5)
HCT: 26.7 % — ABNORMAL LOW (ref 34.8–46.6)
HGB: 9 g/dL — ABNORMAL LOW (ref 11.6–15.9)
MCH: 28.5 pg (ref 25.1–34.0)
MCV: 84.5 fL (ref 79.5–101.0)
MONO%: 4.5 % (ref 0.0–14.0)
NEUT#: 2.1 10*3/uL (ref 1.5–6.5)
NEUT%: 47.4 % (ref 38.4–76.8)
Platelets: 188 10*3/uL (ref 145–400)

## 2011-09-01 MED ORDER — DARBEPOETIN ALFA-POLYSORBATE 500 MCG/ML IJ SOLN
300.0000 ug | Freq: Once | INTRAMUSCULAR | Status: AC
  Administered 2011-09-01: 300 ug via SUBCUTANEOUS
  Filled 2011-09-01: qty 1

## 2011-09-22 ENCOUNTER — Other Ambulatory Visit: Payer: Medicare Other | Admitting: Lab

## 2011-09-22 ENCOUNTER — Ambulatory Visit (HOSPITAL_BASED_OUTPATIENT_CLINIC_OR_DEPARTMENT_OTHER): Payer: Medicare Other

## 2011-09-22 VITALS — BP 115/71 | HR 82 | Temp 97.4°F

## 2011-09-22 DIAGNOSIS — D649 Anemia, unspecified: Secondary | ICD-10-CM

## 2011-09-22 LAB — CBC WITH DIFFERENTIAL/PLATELET
BASO%: 0 % (ref 0.0–2.0)
Eosinophils Absolute: 0.1 10*3/uL (ref 0.0–0.5)
HCT: 30.8 % — ABNORMAL LOW (ref 34.8–46.6)
LYMPH%: 50 % — ABNORMAL HIGH (ref 14.0–49.7)
MCHC: 34.1 g/dL (ref 31.5–36.0)
MCV: 86.5 fL (ref 79.5–101.0)
MONO#: 0.2 10*3/uL (ref 0.1–0.9)
MONO%: 6.8 % (ref 0.0–14.0)
NEUT%: 40.9 % (ref 38.4–76.8)
Platelets: 143 10*3/uL — ABNORMAL LOW (ref 145–400)
RBC: 3.56 10*6/uL — ABNORMAL LOW (ref 3.70–5.45)
WBC: 3.5 10*3/uL — ABNORMAL LOW (ref 3.9–10.3)

## 2011-09-22 MED ORDER — DARBEPOETIN ALFA-POLYSORBATE 300 MCG/0.6ML IJ SOLN
300.0000 ug | Freq: Once | INTRAMUSCULAR | Status: AC
  Administered 2011-09-22: 300 ug via SUBCUTANEOUS
  Filled 2011-09-22: qty 0.6

## 2011-10-07 ENCOUNTER — Telehealth: Payer: Self-pay | Admitting: Oncology

## 2011-10-07 NOTE — Telephone Encounter (Signed)
S/w the pt regarding her jan 2013 appts

## 2011-10-14 ENCOUNTER — Encounter: Payer: Self-pay | Admitting: Oncology

## 2011-10-14 ENCOUNTER — Other Ambulatory Visit: Payer: Self-pay | Admitting: Oncology

## 2011-10-14 ENCOUNTER — Other Ambulatory Visit (HOSPITAL_BASED_OUTPATIENT_CLINIC_OR_DEPARTMENT_OTHER): Payer: Medicare Other | Admitting: Lab

## 2011-10-14 ENCOUNTER — Ambulatory Visit (HOSPITAL_BASED_OUTPATIENT_CLINIC_OR_DEPARTMENT_OTHER): Payer: Medicare Other | Admitting: Oncology

## 2011-10-14 ENCOUNTER — Telehealth: Payer: Self-pay | Admitting: Oncology

## 2011-10-14 ENCOUNTER — Ambulatory Visit: Payer: Medicare Other | Admitting: Oncology

## 2011-10-14 VITALS — BP 163/93 | HR 67 | Temp 96.7°F | Wt 174.9 lb

## 2011-10-14 DIAGNOSIS — D649 Anemia, unspecified: Secondary | ICD-10-CM | POA: Diagnosis not present

## 2011-10-14 DIAGNOSIS — D539 Nutritional anemia, unspecified: Secondary | ICD-10-CM

## 2011-10-14 DIAGNOSIS — I10 Essential (primary) hypertension: Secondary | ICD-10-CM

## 2011-10-14 LAB — CBC WITH DIFFERENTIAL/PLATELET
EOS%: 3.3 % (ref 0.0–7.0)
Eosinophils Absolute: 0.1 10*3/uL (ref 0.0–0.5)
MCV: 88.1 fL (ref 79.5–101.0)
MONO%: 6.1 % (ref 0.0–14.0)
NEUT#: 1.8 10*3/uL (ref 1.5–6.5)
RBC: 4.19 10*6/uL (ref 3.70–5.45)
RDW: 15.6 % — ABNORMAL HIGH (ref 11.2–14.5)

## 2011-10-14 MED ORDER — DARBEPOETIN ALFA-POLYSORBATE 500 MCG/ML IJ SOLN: 300.0000 ug | Freq: Once | INTRAMUSCULAR | Status: DC

## 2011-10-14 NOTE — Progress Notes (Signed)
Hematology and Oncology Follow Up Visit  Susan [name] [id number] 11/26/1940 71 y.o. 10/14/2011 12:02 PM  CC: Susan Caroli, MD   DIAGNOSIS:  This is a 71 year old woman with multifactorial anemia.  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  CURRENT THERAPY:  She is on Aranesp 300 mcg every 3 weeks to keep her hemoglobin above 11.  SECONDARY DIAGNOSIS:  Controlled hypertension.  HISTORY OF PRESENT ILLNESS:  Susan [name] presents today for a follow-up visit.  She is a very pleasant 71 year old woman without any significant comorbid conditions.  She does have history of hypertension, but, for the most part, is in reasonably good health and shape.  She is tolerating Aranesp very well and it was able to really help with her overall quality of life and her performance status. Her last Aranesp was on 09/22/11. She really functions well when her hemoglobin is above 11.  The Aranesp has helped keeping doing that.  She has not had any injection-related problems.  She had not had any out of control blood pressures.  Overall, her performance status and activity level remain at a reasonable range at this time.Her only complains today are sinus congestion, for which she is to be seen by her PCP sometime today. She has occasional night sweats which are attributed to her thyroid problems, an issue to be discussed with her primary as well.   REVIEW OF SYSTEMS:  She does not report any headaches, blurry vision or double vision.  She does not report any motor or sensory neuropathy.  She does not report any alteration in mental status.  She does not report any psychiatric issues, depression.  She does not report any fever, chills or sweats.  She does not report any cough, hemoptysis or hematemesis.  No nausea or vomiting.  No abdominal pain, hematochezia or melena. No genitourinary complaints.  The rest review of systems is unremarkable.  Medications: hydrochlorothiazide (HYDRODIURIL)  25 MG tablet,Synthroid 100 mcg MWF and 50 mcg the other days. Tramadol 50 mg prn pain  Past Medical History, Surgical history, Social history, and Family History were reviewed and updated.   Physical Exam:   VS: 163/93, 67, 20, 96.7, wt 174.9 lbs  General Appearance:  An alert and awake female who appeared in no active distress.    HEENT:  Head is normocephalic, atraumatic.  Pupils are equal, round and reactive to light.  Oral mucosa is moist and pink.   Neck:  Supple. No lymphadenopathy.   Heart:  Regular rate and rhythm.  S1, S2.   Lungs:  Clear to auscultation.  No rhonchi, wheeze or dullness to percussion.   Abdomen:  Soft, nontender.  No hepatosplenomegaly.   Extremities:  No clubbing, cyanosis or edema.    Neurologic:  Intact motor, sensory and deep tendon reflexes.  Lab Results:   Lab 10/14/11 1116  WBC 3.9  HGB 12.4  HCT 36.9  PLT 165  MCV 88.1  MCH 29.6  MCHC 33.6  RDW 15.6*  LYMPHSABS 1.7  MONOABS 0.2  EOSABS 0.1  BASOSABS 0.0  BANDABS --    CMP   No results found for this basename: NA:5,K:5,CL:5,CO2:5,GLUCOSE:5,BUN:5,CREATININE:5,GFRCGP,:5,CALCIUM:5,MG:5,AST:5,ALT:5,ALKPHOS:5,BILITOT:5 in the last 168 hours      Component Value Date/Time   BILITOT 0.5 08/19/2010 0934       Impression and Plan:  This is a pleasant 71 year old female with the following issues:   1. Multifactorial anemia:  She has an element of anemia of renal disease as well  as anemia of possible myelodysplasia.  Again, she has responded quite nicely to Aranesp.  The plan is to keep her on Aranesp 300 mcg every 3 weeks to keep her hemoglobin above 11. Patient's Hb is adequate today at 12.4. Will recheck her CBC in 3 weeks time as scheduled. 2. Hypertension:  This seems to be under reasonable control and is followed by her primary care physician 3. 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 4.  Followup will be on December 27, 2011 with labs.   Spent more than half the time coordinating care.    Susan Jumbo, MD 1/11/201312:02 PM

## 2011-10-14 NOTE — Telephone Encounter (Signed)
gve the pt her jan-April 2013 appt calendar

## 2011-11-02 ENCOUNTER — Ambulatory Visit: Payer: Medicare Other

## 2011-11-02 ENCOUNTER — Other Ambulatory Visit: Payer: Medicare Other | Admitting: Lab

## 2011-11-03 ENCOUNTER — Ambulatory Visit (HOSPITAL_BASED_OUTPATIENT_CLINIC_OR_DEPARTMENT_OTHER): Payer: Medicare Other

## 2011-11-03 ENCOUNTER — Other Ambulatory Visit (HOSPITAL_BASED_OUTPATIENT_CLINIC_OR_DEPARTMENT_OTHER): Payer: Medicare Other | Admitting: Lab

## 2011-11-03 VITALS — BP 153/82 | HR 67 | Temp 98.7°F

## 2011-11-03 DIAGNOSIS — D649 Anemia, unspecified: Secondary | ICD-10-CM | POA: Diagnosis not present

## 2011-11-03 DIAGNOSIS — D539 Nutritional anemia, unspecified: Secondary | ICD-10-CM | POA: Diagnosis not present

## 2011-11-03 DIAGNOSIS — N289 Disorder of kidney and ureter, unspecified: Secondary | ICD-10-CM

## 2011-11-03 LAB — CBC WITH DIFFERENTIAL/PLATELET
BASO%: 0.2 % (ref 0.0–2.0)
Basophils Absolute: 0 10*3/uL (ref 0.0–0.1)
EOS%: 2.6 % (ref 0.0–7.0)
HCT: 30.3 % — ABNORMAL LOW (ref 34.8–46.6)
HGB: 10.4 g/dL — ABNORMAL LOW (ref 11.6–15.9)
MCH: 29.7 pg (ref 25.1–34.0)
MONO#: 0.3 10*3/uL (ref 0.1–0.9)
NEUT%: 44.3 % (ref 38.4–76.8)
RDW: 14.6 % — ABNORMAL HIGH (ref 11.2–14.5)
WBC: 3.7 10*3/uL — ABNORMAL LOW (ref 3.9–10.3)
lymph#: 1.7 10*3/uL (ref 0.9–3.3)

## 2011-11-03 MED ORDER — DARBEPOETIN ALFA-POLYSORBATE 300 MCG/0.6ML IJ SOLN
300.0000 ug | Freq: Once | INTRAMUSCULAR | Status: AC
  Administered 2011-11-03: 300 ug via SUBCUTANEOUS
  Filled 2011-11-03: qty 1

## 2011-11-04 ENCOUNTER — Other Ambulatory Visit: Payer: Medicare Other | Admitting: Lab

## 2011-11-04 ENCOUNTER — Ambulatory Visit: Payer: Medicare Other

## 2011-11-21 DIAGNOSIS — Z1231 Encounter for screening mammogram for malignant neoplasm of breast: Secondary | ICD-10-CM | POA: Diagnosis not present

## 2011-11-24 ENCOUNTER — Ambulatory Visit: Payer: Medicare Other

## 2011-11-24 ENCOUNTER — Other Ambulatory Visit (HOSPITAL_BASED_OUTPATIENT_CLINIC_OR_DEPARTMENT_OTHER): Payer: Medicare Other | Admitting: Lab

## 2011-11-24 DIAGNOSIS — D649 Anemia, unspecified: Secondary | ICD-10-CM

## 2011-11-24 LAB — CBC WITH DIFFERENTIAL/PLATELET
Basophils Absolute: 0.1 10*3/uL (ref 0.0–0.1)
Eosinophils Absolute: 0.1 10*3/uL (ref 0.0–0.5)
HCT: 32.9 % — ABNORMAL LOW (ref 34.8–46.6)
HGB: 11.4 g/dL — ABNORMAL LOW (ref 11.6–15.9)
MCH: 29.7 pg (ref 25.1–34.0)
NEUT#: 1.3 10*3/uL — ABNORMAL LOW (ref 1.5–6.5)
NEUT%: 39.5 % (ref 38.4–76.8)
RDW: 15.6 % — ABNORMAL HIGH (ref 11.2–14.5)
lymph#: 1.5 10*3/uL (ref 0.9–3.3)

## 2011-11-24 NOTE — Progress Notes (Signed)
Pt entered center today for labs and possible Aranesp injection. Labs where noted  With a Hemoglobin of 11.4 and Hematoglobin of 32.9. Injection held per orders. Pt instructed to keep scheduled appointments and to call for issues. Pt verbalized  Understanding orders.

## 2011-12-13 DIAGNOSIS — N3000 Acute cystitis without hematuria: Secondary | ICD-10-CM | POA: Diagnosis not present

## 2011-12-13 DIAGNOSIS — M79609 Pain in unspecified limb: Secondary | ICD-10-CM | POA: Diagnosis not present

## 2011-12-13 DIAGNOSIS — I10 Essential (primary) hypertension: Secondary | ICD-10-CM | POA: Diagnosis not present

## 2011-12-13 DIAGNOSIS — R7309 Other abnormal glucose: Secondary | ICD-10-CM | POA: Diagnosis not present

## 2011-12-13 DIAGNOSIS — Z79899 Other long term (current) drug therapy: Secondary | ICD-10-CM | POA: Diagnosis not present

## 2011-12-13 DIAGNOSIS — E559 Vitamin D deficiency, unspecified: Secondary | ICD-10-CM | POA: Diagnosis not present

## 2011-12-13 DIAGNOSIS — E782 Mixed hyperlipidemia: Secondary | ICD-10-CM | POA: Diagnosis not present

## 2011-12-15 ENCOUNTER — Ambulatory Visit (HOSPITAL_BASED_OUTPATIENT_CLINIC_OR_DEPARTMENT_OTHER): Payer: Medicare Other

## 2011-12-15 ENCOUNTER — Other Ambulatory Visit (HOSPITAL_BASED_OUTPATIENT_CLINIC_OR_DEPARTMENT_OTHER): Payer: Medicare Other | Admitting: Lab

## 2011-12-15 VITALS — BP 124/68 | HR 90 | Temp 98.7°F

## 2011-12-15 DIAGNOSIS — D649 Anemia, unspecified: Secondary | ICD-10-CM

## 2011-12-15 DIAGNOSIS — N289 Disorder of kidney and ureter, unspecified: Secondary | ICD-10-CM | POA: Diagnosis not present

## 2011-12-15 LAB — CBC WITH DIFFERENTIAL/PLATELET
Basophils Absolute: 0 10*3/uL (ref 0.0–0.1)
EOS%: 1.6 % (ref 0.0–7.0)
Eosinophils Absolute: 0.1 10*3/uL (ref 0.0–0.5)
HCT: 24.4 % — ABNORMAL LOW (ref 34.8–46.6)
HGB: 8.5 g/dL — ABNORMAL LOW (ref 11.6–15.9)
LYMPH%: 36.2 % (ref 14.0–49.7)
MCH: 29.6 pg (ref 25.1–34.0)
MCV: 85.2 fL (ref 79.5–101.0)
MONO%: 5.4 % (ref 0.0–14.0)
NEUT#: 2.8 10*3/uL (ref 1.5–6.5)
NEUT%: 56.7 % (ref 38.4–76.8)
Platelets: 192 10*3/uL (ref 145–400)

## 2011-12-15 MED ORDER — DARBEPOETIN ALFA-POLYSORBATE 300 MCG/0.6ML IJ SOLN
300.0000 ug | Freq: Once | INTRAMUSCULAR | Status: AC
  Administered 2011-12-15: 300 ug via SUBCUTANEOUS
  Filled 2011-12-15: qty 0.6

## 2011-12-16 ENCOUNTER — Other Ambulatory Visit: Payer: Self-pay | Admitting: Internal Medicine

## 2011-12-16 DIAGNOSIS — R0989 Other specified symptoms and signs involving the circulatory and respiratory systems: Secondary | ICD-10-CM

## 2011-12-20 ENCOUNTER — Other Ambulatory Visit: Payer: Medicare Other

## 2011-12-28 ENCOUNTER — Other Ambulatory Visit: Payer: Self-pay

## 2011-12-28 ENCOUNTER — Encounter (HOSPITAL_COMMUNITY): Payer: Self-pay | Admitting: Emergency Medicine

## 2011-12-28 ENCOUNTER — Emergency Department (HOSPITAL_COMMUNITY): Payer: Medicare Other

## 2011-12-28 ENCOUNTER — Emergency Department (HOSPITAL_COMMUNITY)
Admission: EM | Admit: 2011-12-28 | Discharge: 2011-12-28 | Disposition: A | Discharge status: Home / Self Care | Payer: Medicare Other | Attending: Emergency Medicine | Admitting: Emergency Medicine

## 2011-12-28 DIAGNOSIS — I10 Essential (primary) hypertension: Secondary | ICD-10-CM | POA: Insufficient documentation

## 2011-12-28 DIAGNOSIS — D649 Anemia, unspecified: Secondary | ICD-10-CM | POA: Insufficient documentation

## 2011-12-28 DIAGNOSIS — R079 Chest pain, unspecified: Secondary | ICD-10-CM | POA: Insufficient documentation

## 2011-12-28 LAB — CBC
HCT: 23 % — ABNORMAL LOW (ref 36.0–46.0)
Hemoglobin: 8 g/dL — ABNORMAL LOW (ref 12.0–15.0)
MCV: 86.8 fL (ref 78.0–100.0)
RBC: 2.65 MIL/uL — ABNORMAL LOW (ref 3.87–5.11)
RDW: 17 % — ABNORMAL HIGH (ref 11.5–15.5)
WBC: 3.3 10*3/uL — ABNORMAL LOW (ref 4.0–10.5)

## 2011-12-28 LAB — POCT I-STAT, CHEM 8
BUN: 15 mg/dL (ref 6–23)
Calcium, Ion: 1.18 mmol/L (ref 1.12–1.32)
Chloride: 111 mEq/L (ref 96–112)
Creatinine, Ser: 1.1 mg/dL (ref 0.50–1.10)
TCO2: 23 mmol/L (ref 0–100)

## 2011-12-28 LAB — DIFFERENTIAL
Basophils Absolute: 0 10*3/uL (ref 0.0–0.1)
Eosinophils Relative: 2 % (ref 0–5)
Lymphocytes Relative: 42 % (ref 12–46)
Lymphs Abs: 1.4 10*3/uL (ref 0.7–4.0)
Monocytes Absolute: 0.3 10*3/uL (ref 0.1–1.0)
Monocytes Relative: 8 % (ref 3–12)
Neutro Abs: 1.6 10*3/uL — ABNORMAL LOW (ref 1.7–7.7)

## 2011-12-28 LAB — POCT I-STAT TROPONIN I: Troponin i, poc: 0 ng/mL (ref 0.00–0.08)

## 2011-12-28 MED ORDER — ALUM & MAG HYDROXIDE-SIMETH 200-200-20 MG/5ML PO SUSP
30.0000 mL | Freq: Once | ORAL | Status: AC
  Administered 2011-12-28: 30 mL via ORAL
  Filled 2011-12-28: qty 30

## 2011-12-28 NOTE — ED Notes (Signed)
Pt states earlier today she felt dizzy and tired  Pt states about 3pm she started to have chest pain that radiates into her back  Pt states she has been belching a lot today  Pt states she laid on the heating pad but it did not help

## 2011-12-28 NOTE — Discharge Instructions (Signed)

## 2011-12-28 NOTE — ED Provider Notes (Signed)
History     CSN: [id number]  Arrival date & time 12/28/11  1956   First MD Initiated Contact with Patient 12/28/11 2059      Chief Complaint  Patient presents with  . Chest Pain     HPI This is a 71 year old woman with multifactorial anemia. 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  Pt states earlier today she felt dizzy and tired Pt states about 3pm she started to have chest pain that radiates into her back Pt states she has been belching a lot today Pt states she laid on the heating pad but it did not help and patient states that she has a blood disorder that causes her to get anemia and she requires shots to stimulate her bone marrow.  Her blood count has recently been low.  She says she feels better now.  Patient states she also has a history of of GERD and takes medications.  She said most of her discomfort was related between her shoulders and not in her chest.  She denies diaphoresis nausea or vomiting.  Patient denies hematochezia, melanoma.   Past Medical History  Diagnosis Date  . Anemia   . Hypertension     Past Surgical History  Procedure Date  . Hernia repair   . Rotator cuff repair     Family History  Problem Relation Age of Onset  . Stroke Father   . Stroke Sister   . Coronary artery disease Brother   . Hypertension Other     History  Substance Use Topics  . Smoking status: Former Research scientist (life sciences)  . Smokeless tobacco: Not on file  . Alcohol Use: No    OB History    Grav Para Term Preterm Abortions TAB SAB Ect Mult Living                  Review of Systems  All other systems reviewed and are negative.    Allergies  Codeine; Penicillins; Bactrim; Biaxin; Flagyl; and Sulfa antibiotics  Home Medications   Current Outpatient Rx  Name Route Sig Dispense Refill  . HYDROCHLOROTHIAZIDE 25 MG PO TABS Oral Take 25 mg by mouth daily.    [name] LEVOTHYROXINE SODIUM 100 MCG PO TABS Oral Take 100 mcg by mouth daily.    [name]  PRAVASTATIN SODIUM 40 MG PO TABS Oral Take 40 mg by mouth daily.    [name] VITAMIN A 09811 UNITS PO CAPS Oral Take 10,000 Units by mouth daily.    [name] HYDROCHLOROTHIAZIDE 25 MG PO TABS        BP 195/94  Pulse 65  Temp(Src) 98.4 F (36.9 C) (Oral)  Resp 18  SpO2 98%  Physical Exam  Nursing note and vitals reviewed. Constitutional: She is oriented to person, place, and time. She appears well-developed and well-nourished. No distress.  HENT:  Head: Normocephalic and atraumatic.  Eyes: Pupils are equal, round, and reactive to light.  Neck: Normal range of motion.  Cardiovascular: Normal rate and intact distal pulses.         Date: 12/28/2011  Rate: 64  Rhythm: normal sinus rhythm  QRS Axis: normal  Intervals: normal  ST/T Wave abnormalities: normal  Conduction Disutrbances: none  Narrative Interpretation: unremarkable      Pulmonary/Chest: No respiratory distress.  Abdominal: Normal appearance. She exhibits no distension.  Musculoskeletal: Normal range of motion.  Neurological: She is alert and oriented to person, place, and time. No cranial nerve deficit.  Skin: Skin is warm  and dry. No rash noted.  Psychiatric: She has a normal mood and affect. Her behavior is normal.    ED Course  Procedures (including critical care time)  Labs Reviewed  CBC - Abnormal; Notable for the following:    WBC 3.3 (*)    RBC 2.65 (*)    Hemoglobin 8.0 (*)    HCT 23.0 (*)    RDW 17.0 (*)    Platelets 134 (*)    All other components within normal limits  DIFFERENTIAL - Abnormal; Notable for the following:    Neutro Abs 1.6 (*)    All other components within normal limits  POCT I-STAT, CHEM 8 - Abnormal; Notable for the following:    Potassium 3.2 (*)    Hemoglobin 8.2 (*)    HCT 24.0 (*)    All other components within normal limits  POCT I-STAT TROPONIN I   Dg Chest 2 View  12/28/2011  *RADIOLOGY REPORT*  Clinical Data: Chest pain  CHEST - 2 VIEW  Comparison: 12/12/2009  Findings: Heart  size is enlarged.  Negative for heart failure. Negative for pneumonia or effusion.  Lungs are clear  IMPRESSION: No acute cardiopulmonary process.  Original Report Authenticated By: Truett Perna, M.D.     1. Anemia       MDM  I spoke with the oncologist on call discussed the case is dictated with him he recommended that she call the office in the morning for consultation and close followup.       Dot Lanes, MD 12/28/11 2241

## 2012-01-05 ENCOUNTER — Other Ambulatory Visit (HOSPITAL_BASED_OUTPATIENT_CLINIC_OR_DEPARTMENT_OTHER): Payer: Medicare Other | Admitting: Lab

## 2012-01-05 ENCOUNTER — Ambulatory Visit (HOSPITAL_BASED_OUTPATIENT_CLINIC_OR_DEPARTMENT_OTHER): Payer: Medicare Other | Admitting: Oncology

## 2012-01-05 ENCOUNTER — Telehealth: Payer: Self-pay | Admitting: Oncology

## 2012-01-05 VITALS — BP 182/92 | HR 64 | Temp 99.0°F | Ht 62.5 in | Wt 176.5 lb

## 2012-01-05 DIAGNOSIS — I10 Essential (primary) hypertension: Secondary | ICD-10-CM

## 2012-01-05 DIAGNOSIS — N289 Disorder of kidney and ureter, unspecified: Secondary | ICD-10-CM | POA: Diagnosis not present

## 2012-01-05 DIAGNOSIS — D649 Anemia, unspecified: Secondary | ICD-10-CM

## 2012-01-05 LAB — CBC WITH DIFFERENTIAL/PLATELET
Basophils Absolute: 0 10*3/uL (ref 0.0–0.1)
Eosinophils Absolute: 0.1 10*3/uL (ref 0.0–0.5)
HCT: 27.8 % — ABNORMAL LOW (ref 34.8–46.6)
HGB: 9.4 g/dL — ABNORMAL LOW (ref 11.6–15.9)
MCH: 29.7 pg (ref 25.1–34.0)
MCV: 87.7 fL (ref 79.5–101.0)
MONO%: 7.6 % (ref 0.0–14.0)
NEUT#: 1.3 10*3/uL — ABNORMAL LOW (ref 1.5–6.5)
NEUT%: 40.6 % (ref 38.4–76.8)
RDW: 16.5 % — ABNORMAL HIGH (ref 11.2–14.5)
lymph#: 1.5 10*3/uL (ref 0.9–3.3)

## 2012-01-05 LAB — TECHNOLOGIST REVIEW

## 2012-01-05 MED ORDER — DARBEPOETIN ALFA-POLYSORBATE 300 MCG/0.6ML IJ SOLN
300.0000 ug | Freq: Once | INTRAMUSCULAR | Status: AC
  Administered 2012-01-05: 300 ug via SUBCUTANEOUS
  Filled 2012-01-05: qty 0.6

## 2012-01-05 NOTE — Telephone Encounter (Signed)
appts made and printed for pt aom °

## 2012-01-05 NOTE — Progress Notes (Signed)
Hematology and Oncology Follow Up Visit  Susan [name] [id number] 18-Aug-1941 71 y.o. 01/05/2012 9:34 AM  CC: Agustina Caroli, MD   DIAGNOSIS:  This is a 71 year old woman with multifactorial anemia.  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  CURRENT THERAPY:  She is on Aranesp 300 mcg every 3 weeks to keep her hemoglobin above 11.  SECONDARY DIAGNOSIS:  Controlled hypertension.  HISTORY OF PRESENT ILLNESS:  Mrs. Oestreich presents today for a follow-up visit.  She is a very pleasant 71 year old woman without any significant comorbid conditions.  She does have history of hypertension, but, for the most part, is in reasonably good health and shape.  She is tolerating Aranesp very well and it was able to really help with her overall quality of life and her performance status. She really functions well when her hemoglobin is above 11.  The Aranesp has helped keeping doing that.  She has not had any injection-related problems. Overall, her performance status and activity level remain at a reasonable range at this time.Her only complains today are sinus congestion that seems to be improving. She has reported some fatigue at times epically when her Hgb drops down below 10.   REVIEW OF SYSTEMS:  She does not report any headaches, blurry vision or double vision.  She does not report any motor or sensory neuropathy.  She does not report any alteration in mental status.  She does not report any psychiatric issues, depression.  She does not report any fever, chills or sweats.  She does not report any cough, hemoptysis or hematemesis.  No nausea or vomiting.  No abdominal pain, hematochezia or melena. No genitourinary complaints.  The rest review of systems is unremarkable.   Past Medical History, Surgical history, Social history, and Family History were reviewed and updated.   Physical Exam: BP 182/92. P 64, T 99. RR 16  General Appearance:  An alert and awake female who  appeared in no active distress.    HEENT:  Head is normocephalic, atraumatic.  Pupils are equal, round and reactive to light.  Oral mucosa is moist and pink.   Neck:  Supple. No lymphadenopathy.   Heart:  Regular rate and rhythm.  S1, S2.   Lungs:  Clear to auscultation.  No rhonchi, wheeze or dullness to percussion.   Abdomen:  Soft, nontender.  No hepatosplenomegaly.   Extremities:  No clubbing, cyanosis or edema.    Neurologic:  Intact motor, sensory and deep tendon reflexes.  Lab Results:   Lab 01/05/12 0901  WBC 3.1*  HGB 9.4*  HCT 27.8*  PLT 157  MCV 87.7  MCH 29.7  MCHC 33.8  RDW 16.5*  LYMPHSABS 1.5  MONOABS 0.2  EOSABS 0.1  BASOSABS 0.0  BANDABS --      Impression and Plan:  This is a pleasant 71 year old female with the following issues:   1. Multifactorial anemia:  She has an element of anemia of renal disease as well as anemia of possible myelodysplasia.  Again, she has responded quite nicely to Aranesp.  The plan is to keep her on Aranesp 300 mcg every 3 weeks to keep her hemoglobin above 11. Patient's Hgb is low today and she will get Aransep. 2. Hypertension:  This seems to be under reasonable control and is followed by her primary care physician. She stopped HCTZ and might have caused her BP to bel elevated today.  3. 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 4. Follow up on 6/27     [facility], MD 4/4/20139:34 AM

## 2012-01-26 ENCOUNTER — Ambulatory Visit: Payer: Medicare Other

## 2012-01-26 ENCOUNTER — Other Ambulatory Visit (HOSPITAL_BASED_OUTPATIENT_CLINIC_OR_DEPARTMENT_OTHER): Payer: Medicare Other | Admitting: Lab

## 2012-01-26 DIAGNOSIS — D649 Anemia, unspecified: Secondary | ICD-10-CM

## 2012-01-26 LAB — CBC WITH DIFFERENTIAL/PLATELET
Basophils Absolute: 0 10*3/uL (ref 0.0–0.1)
HCT: 34.5 % — ABNORMAL LOW (ref 34.8–46.6)
HGB: 11.6 g/dL (ref 11.6–15.9)
LYMPH%: 46.2 % (ref 14.0–49.7)
MONO#: 0.3 10*3/uL (ref 0.1–0.9)
NEUT%: 42.2 % (ref 38.4–76.8)
Platelets: 116 10*3/uL — ABNORMAL LOW (ref 145–400)
WBC: 3 10*3/uL — ABNORMAL LOW (ref 3.9–10.3)
lymph#: 1.4 10*3/uL (ref 0.9–3.3)

## 2012-01-26 MED ORDER — DARBEPOETIN ALFA-POLYSORBATE 500 MCG/ML IJ SOLN: 300.0000 ug | Freq: Once | INTRAMUSCULAR | Status: DC

## 2012-02-16 ENCOUNTER — Ambulatory Visit (HOSPITAL_BASED_OUTPATIENT_CLINIC_OR_DEPARTMENT_OTHER): Payer: Medicare Other

## 2012-02-16 ENCOUNTER — Other Ambulatory Visit (HOSPITAL_BASED_OUTPATIENT_CLINIC_OR_DEPARTMENT_OTHER): Payer: Medicare Other | Admitting: Lab

## 2012-02-16 VITALS — BP 176/92 | HR 67 | Temp 97.4°F

## 2012-02-16 DIAGNOSIS — D649 Anemia, unspecified: Secondary | ICD-10-CM

## 2012-02-16 LAB — CBC WITH DIFFERENTIAL/PLATELET
Basophils Absolute: 0 10*3/uL (ref 0.0–0.1)
Eosinophils Absolute: 0.1 10*3/uL (ref 0.0–0.5)
HCT: 31.9 % — ABNORMAL LOW (ref 34.8–46.6)
HGB: 10.8 g/dL — ABNORMAL LOW (ref 11.6–15.9)
LYMPH%: 42.2 % (ref 14.0–49.7)
MCV: 84.7 fL (ref 79.5–101.0)
MONO%: 8.3 % (ref 0.0–14.0)
NEUT#: 1.8 10*3/uL (ref 1.5–6.5)
NEUT%: 46.6 % (ref 38.4–76.8)
Platelets: 154 10*3/uL (ref 145–400)

## 2012-02-16 MED ORDER — DARBEPOETIN ALFA-POLYSORBATE 500 MCG/ML IJ SOLN: 300.0000 ug | Freq: Once | INTRAMUSCULAR | Status: DC

## 2012-02-16 MED ORDER — DARBEPOETIN ALFA-POLYSORBATE 300 MCG/0.6ML IJ SOLN
300.0000 ug | Freq: Once | INTRAMUSCULAR | Status: AC
  Administered 2012-02-16: 300 ug via SUBCUTANEOUS
  Filled 2012-02-16: qty 0.6

## 2012-02-20 ENCOUNTER — Emergency Department (HOSPITAL_COMMUNITY)
Admission: EM | Admit: 2012-02-20 | Discharge: 2012-02-20 | Disposition: A | Discharge status: Home / Self Care | Payer: Medicare Other | Attending: Emergency Medicine | Admitting: Emergency Medicine

## 2012-02-20 ENCOUNTER — Encounter (HOSPITAL_COMMUNITY): Payer: Self-pay

## 2012-02-20 DIAGNOSIS — Z79899 Other long term (current) drug therapy: Secondary | ICD-10-CM | POA: Insufficient documentation

## 2012-02-20 DIAGNOSIS — R6889 Other general symptoms and signs: Secondary | ICD-10-CM | POA: Diagnosis not present

## 2012-02-20 DIAGNOSIS — R209 Unspecified disturbances of skin sensation: Secondary | ICD-10-CM | POA: Insufficient documentation

## 2012-02-20 DIAGNOSIS — E039 Hypothyroidism, unspecified: Secondary | ICD-10-CM | POA: Diagnosis not present

## 2012-02-20 DIAGNOSIS — I10 Essential (primary) hypertension: Secondary | ICD-10-CM

## 2012-02-20 DIAGNOSIS — N3000 Acute cystitis without hematuria: Secondary | ICD-10-CM | POA: Diagnosis not present

## 2012-02-20 DIAGNOSIS — I6789 Other cerebrovascular disease: Secondary | ICD-10-CM | POA: Diagnosis not present

## 2012-02-20 HISTORY — DX: Hypothyroidism, unspecified: E03.9

## 2012-02-20 HISTORY — DX: Gastro-esophageal reflux disease without esophagitis: K21.9

## 2012-02-20 MED ORDER — LORAZEPAM 2 MG/ML IJ SOLN
0.5000 mg | Freq: Once | INTRAMUSCULAR | Status: AC
  Administered 2012-02-20: 0.5 mg via INTRAVENOUS
  Filled 2012-02-20: qty 1

## 2012-02-20 MED ORDER — FENTANYL CITRATE 0.05 MG/ML IJ SOLN
50.0000 ug | Freq: Once | INTRAMUSCULAR | Status: AC
  Administered 2012-02-20: 50 ug via INTRAVENOUS
  Filled 2012-02-20: qty 2

## 2012-02-20 NOTE — ED Notes (Signed)
Pt reports "high BP" starting Thursday while at her pcp, pt woke this am w/bialteral face tingling, headache, and HTN. Pt denies chest/abd pain, N/V

## 2012-02-20 NOTE — ED Notes (Signed)
Per EMS:  Patient from home reportingbilateral facial tingling since this AM with hypertension,all other neuro symptoms are negative.  Patient denies headache, chest pain, n/v.

## 2012-02-20 NOTE — ED Provider Notes (Signed)
History     CSN: [id number]  Arrival date & time 02/20/12  0718   First MD Initiated Contact with Patient 02/20/12 0725      Chief Complaint  Patient presents with  . Numbness    (Consider location/radiation/quality/duration/timing/severity/associated sxs/prior treatment) HPI Comments: Pt reports that at oncology appt a few days ago and had a BP of 123XX123 systolic, normally takes Prinzide daily.  Her oncologist who is treating for anemia told her to follow up with PCP, and so patient called and was called in atenolol to take 100 mg half tablet qhs.  She has done so for past 4 days.  This AM, her BP was again high, Q000111Q systolic and she had a mild HA, worse with standing and tingling diffusely to face and so called EMS.  EMS found a BP of 220/120 at home.  Pt admits getting a little anxious with that BP reading and cites that a sister and brother have had strokes.  She denies numbness or weakness to arms or legs, no gait problems, no change in vision.  No facial droop, slurred speech.  No CP, back pain, SOB, N/V/D.  She reports tingling is currently resolved, minimal HA.    The history is provided by the patient.    Past Medical History  Diagnosis Date  . Anemia   . Hypertension   . GERD (gastroesophageal reflux disease)   . Hypothyroidism     Past Surgical History  Procedure Date  . Hernia repair   . Rotator cuff repair     Family History  Problem Relation Age of Onset  . Stroke Father   . Stroke Sister   . Coronary artery disease Brother   . Hypertension Other     History  Substance Use Topics  . Smoking status: Former Research scientist (life sciences)  . Smokeless tobacco: Not on file  . Alcohol Use: No    OB History    Grav Para Term Preterm Abortions TAB SAB Ect Mult Living                  Review of Systems  Constitutional: Negative.  Negative for fever, chills and fatigue.  Respiratory: Negative for cough and shortness of breath.   Cardiovascular: Negative for chest pain.    Gastrointestinal: Negative for vomiting, abdominal pain and diarrhea.  Musculoskeletal: Negative for back pain.  Neurological: Positive for headaches. Negative for dizziness, weakness and light-headedness.  All other systems reviewed and are negative.    Allergies  Codeine; Penicillins; Bactrim; Clarithromycin; Flagyl; and Sulfa antibiotics  Home Medications   Current Outpatient Rx  Name Route Sig Dispense Refill  . ASPIRIN 81 MG PO CHEW Oral Chew 81 mg by mouth daily.    . ATENOLOL 100 MG PO TABS Oral Take 50-100 mg by mouth at bedtime. Takes a whole one if blood pressure is really high. Only took a half last night. She just started on Thursday    . HYDROCHLOROTHIAZIDE 25 MG PO TABS Oral Take 25 mg by mouth daily.    [name] LEVOTHYROXINE SODIUM 100 MCG PO TABS Oral Take 100 mcg by mouth daily.    [name] LISINOPRIL-HYDROCHLOROTHIAZIDE 20-25 MG PO TABS Oral Take 1 tablet by mouth daily.    [name] PRAVASTATIN SODIUM 40 MG PO TABS Oral Take 40 mg by mouth daily.    [name] VITAMIN D (ERGOCALCIFEROL) PO Oral Take 10,000 Units by mouth daily.      BP 163/73  Pulse 51  Temp(Src) 98.1 F (36.7 C) (Oral)  Resp 18  SpO2 97%  Physical Exam  Nursing note and vitals reviewed. Constitutional: She is oriented to person, place, and time. She appears well-developed and well-nourished. No distress.  HENT:  Head: Normocephalic and atraumatic.  Eyes: EOM are normal. Pupils are equal, round, and reactive to light.  Cardiovascular: Normal rate.   Pulmonary/Chest: Effort normal.  Musculoskeletal: She exhibits no edema and no tenderness.  Neurological: She is alert and oriented to person, place, and time. She has normal strength. No cranial nerve deficit or sensory deficit. Coordination normal.       Normal finer to nose, no arm drift, 5/5 strength in 4 extremities  Skin: Skin is warm. She is not diaphoretic.    ED Course  Procedures (including critical care time)  Labs Reviewed - No data to display No results  found.   1. Hypertension     RA sat is 100% and normal.  9:15 AM Pt reported symptoms much improved, feels improved, is resting at present, BP is improved down to Q000111Q systolic.      10:47 AM Pt's BP is still elevated but improved.  No symptoms, will d/c home and instruct to follow up with PCP.    MDM  BP is high, but no obvious end organ failure occurring.  Pt is reassured.  Will give analgesics and anxiolytic here and monitor briefly.   Pt did take meds this AM, I anticipate BP will improve on its own.  Pt's PCP did tell pt to follow up with him, but due to tingling, pt called EMS.          Saddie Benders. Selma Mink, MD 02/20/12 1047

## 2012-02-20 NOTE — Discharge Instructions (Signed)
Arterial Hypertension Arterial hypertension (high blood pressure) is a condition of elevated pressure in your blood vessels. Hypertension over a long period of time is a risk factor for strokes, heart attacks, and heart failure. It is also the leading cause of kidney (renal) failure.  CAUSES   In Adults -- Over 90% of all hypertension has no known cause. This is called essential or primary hypertension. In the other 10% of people with hypertension, the increase in blood pressure is caused by another disorder. This is called secondary hypertension. Important causes of secondary hypertension are:   Heavy alcohol use.   Obstructive sleep apnea.   Hyperaldosterosim (Conn's syndrome).   Steroid use.   Chronic kidney failure.   Hyperparathyroidism.   Medications.   Renal artery stenosis.   Pheochromocytoma.   Cushing's disease.   Coarctation of the aorta.   Scleroderma renal crisis.   Licorice (in excessive amounts).   Drugs (cocaine, methamphetamine).  Your caregiver can explain any items above that apply to you.  In Children -- Secondary hypertension is more common and should always be considered.   Pregnancy -- Few women of childbearing age have high blood pressure. However, up to 10% of them develop hypertension of pregnancy. Generally, this will not harm the woman. It may be a sign of 3 complications of pregnancy: preeclampsia, HELLP syndrome, and eclampsia. Follow up and control with medication is necessary.  SYMPTOMS   This condition normally does not produce any noticeable symptoms. It is usually found during a routine exam.   Malignant hypertension is a late problem of high blood pressure. It may have the following symptoms:   Headaches.   Blurred vision.   End-organ damage (this means your kidneys, heart, lungs, and other organs are being damaged).   Stressful situations can increase the blood pressure. If a person with normal blood pressure has their blood  pressure go up while being seen by their caregiver, this is often termed "white coat hypertension." Its importance is not known. It may be related with eventually developing hypertension or complications of hypertension.   Hypertension is often confused with mental tension, stress, and anxiety.  DIAGNOSIS  The diagnosis is made by 3 separate blood pressure measurements. They are taken at least 1 week apart from each other. If there is organ damage from hypertension, the diagnosis may be made without repeat measurements. Hypertension is usually identified by having blood pressure readings:  Above 140/90 mmHg measured in both arms, at 3 separate times, over a couple weeks.   Over 130/80 mmHg should be considered a risk factor and may require treatment in patients with diabetes.  Blood pressure readings over 120/80 mmHg are called "pre-hypertension" even in non-diabetic patients. To get a true blood pressure measurement, use the following guidelines. Be aware of the factors that can alter blood pressure readings.  Take measurements at least 1 hour after caffeine.   Take measurements 30 minutes after smoking and without any stress. This is another reason to quit smoking - it raises your blood pressure.   Use a proper cuff size. Ask your caregiver if you are not sure about your cuff size.   Most home blood pressure cuffs are automatic. They will measure systolic and diastolic pressures. The systolic pressure is the pressure reading at the start of sounds. Diastolic pressure is the pressure at which the sounds disappear. If you are elderly, measure pressures in multiple postures. Try sitting, lying or standing.   Sit at rest for a minimum of   5 minutes before taking measurements.   You should not be on any medications like decongestants. These are found in many cold medications.   Record your blood pressure readings and review them with your caregiver.  If you have hypertension:  Your caregiver  may do tests to be sure you do not have secondary hypertension (see "causes" above).   Your caregiver may also look for signs of metabolic syndrome. This is also called Syndrome X or Insulin Resistance Syndrome. You may have this syndrome if you have type 2 diabetes, abdominal obesity, and abnormal blood lipids in addition to hypertension.   Your caregiver will take your medical and family history and perform a physical exam.   Diagnostic tests may include blood tests (for glucose, cholesterol, potassium, and kidney function), a urinalysis, or an EKG. Other tests may also be necessary depending on your condition.  PREVENTION  There are important lifestyle issues that you can adopt to reduce your chance of developing hypertension:  Maintain a normal weight.   Limit the amount of salt (sodium) in your diet.   Exercise often.   Limit alcohol intake.   Get enough potassium in your diet. Discuss specific advice with your caregiver.   Follow a DASH diet (dietary approaches to stop hypertension). This diet is rich in fruits, vegetables, and low-fat dairy products, and avoids certain fats.  PROGNOSIS  Essential hypertension cannot be cured. Lifestyle changes and medical treatment can lower blood pressure and reduce complications. The prognosis of secondary hypertension depends on the underlying cause. Many people whose hypertension is controlled with medicine or lifestyle changes can live a normal, healthy life.  RISKS AND COMPLICATIONS  While high blood pressure alone is not an illness, it often requires treatment due to its short- and long-term effects on many organs. Hypertension increases your risk for:  CVAs or strokes (cerebrovascular accident).   Heart failure due to chronically high blood pressure (hypertensive cardiomyopathy).   Heart attack (myocardial infarction).   Damage to the retina (hypertensive retinopathy).   Kidney failure (hypertensive nephropathy).  Your caregiver can  explain list items above that apply to you. Treatment of hypertension can significantly reduce the risk of complications. TREATMENT   For overweight patients, weight loss and regular exercise are recommended. Physical fitness lowers blood pressure.   Mild hypertension is usually treated with diet and exercise. A diet rich in fruits and vegetables, fat-free dairy products, and foods low in fat and salt (sodium) can help lower blood pressure. Decreasing salt intake decreases blood pressure in a 1/3 of people.   Stop smoking if you are a smoker.  The steps above are highly effective in reducing blood pressure. While these actions are easy to suggest, they are difficult to achieve. Most patients with moderate or severe hypertension end up requiring medications to bring their blood pressure down to a normal level. There are several classes of medications for treatment. Blood pressure pills (antihypertensives) will lower blood pressure by their different actions. Lowering the blood pressure by 10 mmHg may decrease the risk of complications by as much as 25%. The goal of treatment is effective blood pressure control. This will reduce your risk for complications. Your caregiver will help you determine the best treatment for you according to your lifestyle. What is excellent treatment for one person, may not be for you. HOME CARE INSTRUCTIONS   Do not smoke.   Follow the lifestyle changes outlined in the "Prevention" section.   If you are on medications, follow the directions   carefully. Blood pressure medications must be taken as prescribed. Skipping doses reduces their benefit. It also puts you at risk for problems.   Follow up with your caregiver, as directed.   If you are asked to monitor your blood pressure at home, follow the guidelines in the "Diagnosis" section above.  SEEK MEDICAL CARE IF:   You think you are having medication side effects.   You have recurrent headaches or lightheadedness.     You have swelling in your ankles.   You have trouble with your vision.  SEEK IMMEDIATE MEDICAL CARE IF:   You have sudden onset of chest pain or pressure, difficulty breathing, or other symptoms of a heart attack.   You have a severe headache.   You have symptoms of a stroke (such as sudden weakness, difficulty speaking, difficulty walking).  MAKE SURE YOU:   Understand these instructions.   Will watch your condition.   Will get help right away if you are not doing well or get worse.  Document Released: 09/19/2005 Document Revised: 09/08/2011 Document Reviewed: 04/19/2007 [employer] Patient Information 2012 [employer]. 

## 2012-02-20 NOTE — ED Notes (Signed)
Patient is resting comfortably. 

## 2012-02-20 NOTE — ED Notes (Signed)
Pt's son is coming to pick up pt in 10-15 mins, pt reports she is feeling sleepy and dizzy. This RN informed pt she can stay in the room until her son gets here d/t the dizziness

## 2012-03-05 DIAGNOSIS — E559 Vitamin D deficiency, unspecified: Secondary | ICD-10-CM | POA: Diagnosis not present

## 2012-03-05 DIAGNOSIS — Z79899 Other long term (current) drug therapy: Secondary | ICD-10-CM | POA: Diagnosis not present

## 2012-03-05 DIAGNOSIS — I10 Essential (primary) hypertension: Secondary | ICD-10-CM | POA: Diagnosis not present

## 2012-03-05 DIAGNOSIS — E782 Mixed hyperlipidemia: Secondary | ICD-10-CM | POA: Diagnosis not present

## 2012-03-07 DIAGNOSIS — J0100 Acute maxillary sinusitis, unspecified: Secondary | ICD-10-CM | POA: Diagnosis not present

## 2012-03-08 ENCOUNTER — Ambulatory Visit: Payer: Medicare Other

## 2012-03-08 ENCOUNTER — Other Ambulatory Visit (HOSPITAL_BASED_OUTPATIENT_CLINIC_OR_DEPARTMENT_OTHER): Payer: Medicare Other | Admitting: Lab

## 2012-03-08 DIAGNOSIS — D649 Anemia, unspecified: Secondary | ICD-10-CM

## 2012-03-08 LAB — CBC WITH DIFFERENTIAL/PLATELET
BASO%: 0.5 % (ref 0.0–2.0)
HCT: 34.9 % (ref 34.8–46.6)
LYMPH%: 54.5 % — ABNORMAL HIGH (ref 14.0–49.7)
MCH: 29.3 pg (ref 25.1–34.0)
MCHC: 34.4 g/dL (ref 31.5–36.0)
MCV: 85.2 fL (ref 79.5–101.0)
MONO#: 0.2 10*3/uL (ref 0.1–0.9)
NEUT%: 37 % — ABNORMAL LOW (ref 38.4–76.8)
Platelets: 130 10*3/uL — ABNORMAL LOW (ref 145–400)
WBC: 3.7 10*3/uL — ABNORMAL LOW (ref 3.9–10.3)

## 2012-03-08 MED ORDER — DARBEPOETIN ALFA-POLYSORBATE 500 MCG/ML IJ SOLN: 300.0000 ug | Freq: Once | INTRAMUSCULAR | Status: DC

## 2012-03-29 ENCOUNTER — Ambulatory Visit (HOSPITAL_BASED_OUTPATIENT_CLINIC_OR_DEPARTMENT_OTHER): Payer: Medicare Other

## 2012-03-29 ENCOUNTER — Telehealth: Payer: Self-pay | Admitting: Oncology

## 2012-03-29 ENCOUNTER — Other Ambulatory Visit: Payer: Medicare Other | Admitting: Lab

## 2012-03-29 ENCOUNTER — Ambulatory Visit (HOSPITAL_BASED_OUTPATIENT_CLINIC_OR_DEPARTMENT_OTHER): Payer: Medicare Other | Admitting: Oncology

## 2012-03-29 ENCOUNTER — Encounter: Payer: Self-pay | Admitting: Oncology

## 2012-03-29 VITALS — BP 136/77 | HR 54 | Temp 98.0°F | Ht 62.5 in | Wt 173.5 lb

## 2012-03-29 DIAGNOSIS — D649 Anemia, unspecified: Secondary | ICD-10-CM

## 2012-03-29 DIAGNOSIS — D696 Thrombocytopenia, unspecified: Secondary | ICD-10-CM

## 2012-03-29 LAB — CBC WITH DIFFERENTIAL/PLATELET
Basophils Absolute: 0 10*3/uL (ref 0.0–0.1)
Eosinophils Absolute: 0.1 10*3/uL (ref 0.0–0.5)
HGB: 10.5 g/dL — ABNORMAL LOW (ref 11.6–15.9)
LYMPH%: 50.5 % — ABNORMAL HIGH (ref 14.0–49.7)
MCV: 85.5 fL (ref 79.5–101.0)
MONO%: 6.2 % (ref 0.0–14.0)
NEUT#: 1.7 10*3/uL (ref 1.5–6.5)
Platelets: 133 10*3/uL — ABNORMAL LOW (ref 145–400)

## 2012-03-29 MED ORDER — DARBEPOETIN ALFA-POLYSORBATE 300 MCG/0.6ML IJ SOLN
300.0000 ug | INTRAMUSCULAR | Status: DC
  Administered 2012-03-29: 300 ug via SUBCUTANEOUS

## 2012-03-29 NOTE — Progress Notes (Signed)
Hematology and Oncology Follow Up Visit  Susan [name] [id number] 11-20-1940 71 y.o. 03/29/2012 11:36 AM [name],Susan DAVID, MDMcKeown, Susan [name],*   Principle Diagnosis: This is a 71 year old female with multifactorial anemia. 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  Current therapy: Aranesp 300 mcg every 3 weeks to keep her hemoglobin above 11.  Interim History:  [name] presents today for a follow-up visit.  She is a very pleasant 71 year old woman without any significant comorbid conditions.  She does have history of hypertension, but, for the most part, is in reasonably good health and shape.  Reports BP meds recently adjusted due to an elevated BP. Synthroid was also adjusted. She is tolerating Aranesp very well and it was able to really help with her overall quality of life and her performance status. She really functions well when her hemoglobin is above 11.  The Aranesp has helped keeping doing that.  She has not had any injection-related problems. Overall, her performance status and activity level remain at a reasonable range at this time. She has reported some fatigue at times epically when her Hgb drops down below 10.   Medications: I have reviewed the patient's current medications. Current outpatient prescriptions:aspirin 81 MG chewable tablet, Chew 81 mg by mouth daily., Disp: , Rfl: ;  atenolol (TENORMIN) 100 MG tablet, Take 50 mg by mouth daily. Takes a whole one if blood pressure is really high. Only took a half last night. She just started on Thursday, Disp: , Rfl: ;  hydrochlorothiazide (HYDRODIURIL) 25 MG tablet, Take 25 mg by mouth daily., Disp: , Rfl:  levothyroxine (SYNTHROID, LEVOTHROID) 100 MCG tablet, Take 100 mcg by mouth daily. 100 mcg 4 times per week and 50 mcg 3 times a week, Disp: , Rfl: ;  lisinopril (PRINIVIL,ZESTRIL) 20 MG tablet, Take 40 mg by mouth daily. , Disp: , Rfl: ;  pravastatin (PRAVACHOL) 40 MG tablet, Take 40 mg  by mouth daily., Disp: , Rfl: ;  VITAMIN D, ERGOCALCIFEROL, PO, Take 10,000 Units by mouth daily., Disp: , Rfl:  No current facility-administered medications for this visit. Facility-Administered Medications Ordered in Other Visits: darbepoetin (ARANESP) injection 300 mcg, 300 mcg, Subcutaneous, Q21 days, Maryanna Shape, NP, 300 mcg at 03/29/12 [id number]  Allergies:  Allergies  Allergen Reactions  . Codeine Anaphylaxis  . Penicillins Anaphylaxis  . Bactrim Itching and Swelling  . Clarithromycin Other (See Comments)    "Caused skin to peel off my hand."  . Flagyl (Metronidazole Hcl) Itching and Swelling  . Sulfa Antibiotics Itching and Swelling    Past Medical History, Surgical history, Social history, and Family History were reviewed and updated.  Review of Systems: Constitutional:  Negative for fever, chills, night sweats, anorexia, weight loss, pain. Cardiovascular: no chest pain or dyspnea on exertion Respiratory: no cough, shortness of breath, or wheezing Neurological: no TIA or stroke symptoms Dermatological: negative ENT: negative Skin: Negative. Gastrointestinal: no abdominal pain, change in bowel habits, or black or bloody stools Genito-Urinary: no dysuria, trouble voiding, or hematuria Hematological and Lymphatic: negative Breast: negative for breast lumps Musculoskeletal: negative Remaining ROS negative.  Physical Exam: Blood pressure 136/77, pulse 54, temperature 98 F (36.7 C), temperature source Oral, height 5' 2.5" (1.588 m), weight 173 lb 8 oz (78.699 kg). ECOG: 1 General appearance: alert, cooperative and no distress Head: Normocephalic, without obvious abnormality, atraumatic Neck: no adenopathy, no carotid bruit, no JVD, supple, symmetrical, trachea midline and thyroid not enlarged, symmetric, no tenderness/mass/nodules  Lymph nodes: Cervical, supraclavicular, and axillary nodes normal. Heart:regular rate and rhythm, S1, S2 normal, no murmur, click, rub or  gallop Lung:chest clear, no wheezing, rales, normal symmetric air entry, no tachypnea, retractions or cyanosis Abdomen: soft, non-tender, without masses or organomegaly EXT:no erythema, induration, or nodules  Lab Results: Lab Results  Component Value Date   WBC 4.2 03/29/2012   HGB 10.5* 03/29/2012   HCT 30.5* 03/29/2012   MCV 85.5 03/29/2012   PLT 133* 03/29/2012     Chemistry      Component Value Date/Time   NA 145 12/28/2011 2121   K 3.2* 12/28/2011 2121   CL 111 12/28/2011 2121   CO2 23 08/19/2010 0934   BUN 15 12/28/2011 2121   CREATININE 1.10 12/28/2011 2121      Component Value Date/Time   CALCIUM 9.3 08/19/2010 0934   ALKPHOS 128* 08/19/2010 0934   AST 17 08/19/2010 0934   ALT 10 08/19/2010 0934   BILITOT 0.5 08/19/2010 0934     Impression and Plan: This is a 71 year old female with the following issues: 1. Multifactorial anemia. She has an element of anemia of renal disease as well as anemia of possible myelodysplasia. She has responded well to Aranesp and is tolerating the medication well. Plan is to continue Aranesp 300 mcg every 3 weeks to keep her Hemoglobin above 11. Her hemoglobin is 10.5 today and she received her injection. 2. Thrombocytopenia. Mild. No active bleeding. Will monitor. 3. HTN. BP controlled with current medications. Continue to follow-up with PCP. 4. 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 If she develops progressive cytopenias or refractory anemia, then will restage her with a bone marrow biopsy. 5. Follow-up. Lab and Aranesp injection every 3 weeks. Visit in about 3 months.  Spent more than half the time coordinating care.    [place], [place] 6/27/201311:36 AM

## 2012-03-29 NOTE — Telephone Encounter (Signed)
appts made and printed   aom

## 2012-03-29 NOTE — Telephone Encounter (Signed)
appts made and printed for pt aom °

## 2012-03-29 NOTE — Progress Notes (Deleted)
Hematology and Oncology Follow Up Visit  Janajah Dahlem Kirshner [id number] 29-Nov-1940 71 y.o. 03/29/2012 8:46 AM  CC: Agustina Caroli, MD   DIAGNOSIS:  This is a 71 year old woman with multifactorial anemia.  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  CURRENT THERAPY:  She is on Aranesp 300 mcg every 3 weeks to keep her hemoglobin above 11.  SECONDARY DIAGNOSIS:  Controlled hypertension.  HISTORY OF PRESENT ILLNESS:  Mrs. Iannuzzi presents today for a follow-up visit.  She is a very pleasant 71 year old woman without any significant comorbid conditions.  She does have history of hypertension, but, for the most part, is in reasonably good health and shape.  She is tolerating Aranesp very well and it was able to really help with her overall quality of life and her performance status. She really functions well when her hemoglobin is above 11.  The Aranesp has helped keeping doing that.  She has not had any injection-related problems. Overall, her performance status and activity level remain at a reasonable range at this time.Her only complains today are sinus congestion that seems to be improving. She has reported some fatigue at times epically when her Hgb drops down below 10.   REVIEW OF SYSTEMS:  She does not report any headaches, blurry vision or double vision.  She does not report any motor or sensory neuropathy.  She does not report any alteration in mental status.  She does not report any psychiatric issues, depression.  She does not report any fever, chills or sweats.  She does not report any cough, hemoptysis or hematemesis.  No nausea or vomiting.  No abdominal pain, hematochezia or melena. No genitourinary complaints.  The rest review of systems is unremarkable.   Past Medical History, Surgical history, Social history, and Family History were reviewed and updated.   Physical Exam: BP 182/92. P 64, T 99. RR 16  General Appearance:  An alert and awake female who  appeared in no active distress.    HEENT:  Head is normocephalic, atraumatic.  Pupils are equal, round and reactive to light.  Oral mucosa is moist and pink.   Neck:  Supple. No lymphadenopathy.   Heart:  Regular rate and rhythm.  S1, S2.   Lungs:  Clear to auscultation.  No rhonchi, wheeze or dullness to percussion.   Abdomen:  Soft, nontender.  No hepatosplenomegaly.   Extremities:  No clubbing, cyanosis or edema.    Neurologic:  Intact motor, sensory and deep tendon reflexes.  Lab Results:   Lab 03/29/12 0821  WBC 4.2  HGB 10.5*  HCT 30.5*  PLT 133*  MCV 85.5  MCH 29.3  MCHC 34.3  RDW 15.9*  LYMPHSABS 2.1  MONOABS 0.3  EOSABS 0.1  BASOSABS 0.0  BANDABS --   Impression and Plan:  This is a pleasant 70 year old female with the following issues:   1. Multifactorial anemia:  She has an element of anemia of renal disease as well as anemia of possible myelodysplasia.  Again, she has responded quite nicely to Aranesp.  The plan is to keep her on Aranesp 300 mcg every 3 weeks to keep her hemoglobin above 11. Patient's Hgb is low today and she will get Aransep. 2. Hypertension:  This seems to be under reasonable control and is followed by her primary care physician. She stopped HCTZ and might have caused her BP to bel elevated today.  3. History of questionable myelodysplasia:  Again, I do not really see any evidence  of that at this point in peripheral blood, but, certainly, if she develops progressive cytopenias or refractory anemia, then we will restage her with a bone marrow biopsy. 4. Follow up on 6/27     Mikey Bussing, MD 6/27/20138:46 AM

## 2012-04-19 ENCOUNTER — Other Ambulatory Visit (HOSPITAL_BASED_OUTPATIENT_CLINIC_OR_DEPARTMENT_OTHER): Payer: Medicare Other | Admitting: Lab

## 2012-04-19 ENCOUNTER — Ambulatory Visit (HOSPITAL_BASED_OUTPATIENT_CLINIC_OR_DEPARTMENT_OTHER): Payer: Medicare Other

## 2012-04-19 DIAGNOSIS — D649 Anemia, unspecified: Secondary | ICD-10-CM

## 2012-04-19 LAB — CBC WITH DIFFERENTIAL/PLATELET
Eosinophils Absolute: 0.1 10*3/uL (ref 0.0–0.5)
LYMPH%: 48.7 % (ref 14.0–49.7)
MCHC: 33.9 g/dL (ref 31.5–36.0)
MCV: 89 fL (ref 79.5–101.0)
MONO%: 7.6 % (ref 0.0–14.0)
NEUT#: 1.4 10*3/uL — ABNORMAL LOW (ref 1.5–6.5)
NEUT%: 41.1 % (ref 38.4–76.8)
Platelets: 132 10*3/uL — ABNORMAL LOW (ref 145–400)
RBC: 3.63 10*6/uL — ABNORMAL LOW (ref 3.70–5.45)

## 2012-04-19 MED ORDER — DARBEPOETIN ALFA-POLYSORBATE 300 MCG/0.6ML IJ SOLN: 300.0000 ug | INTRAMUSCULAR | Status: DC

## 2012-05-10 ENCOUNTER — Ambulatory Visit (HOSPITAL_BASED_OUTPATIENT_CLINIC_OR_DEPARTMENT_OTHER): Payer: Medicare Other

## 2012-05-10 ENCOUNTER — Other Ambulatory Visit: Payer: Medicare Other | Admitting: Lab

## 2012-05-10 VITALS — BP 141/82 | HR 53 | Temp 97.8°F

## 2012-05-10 DIAGNOSIS — D649 Anemia, unspecified: Secondary | ICD-10-CM

## 2012-05-10 LAB — CBC WITH DIFFERENTIAL/PLATELET
BASO%: 0.4 % (ref 0.0–2.0)
EOS%: 1.9 % (ref 0.0–7.0)
LYMPH%: 51 % — ABNORMAL HIGH (ref 14.0–49.7)
MCH: 29.5 pg (ref 25.1–34.0)
MCHC: 33.8 g/dL (ref 31.5–36.0)
MCV: 87.4 fL (ref 79.5–101.0)
MONO%: 7.7 % (ref 0.0–14.0)
Platelets: 139 10*3/uL — ABNORMAL LOW (ref 145–400)
RBC: 3.49 10*6/uL — ABNORMAL LOW (ref 3.70–5.45)
WBC: 3.8 10*3/uL — ABNORMAL LOW (ref 3.9–10.3)

## 2012-05-10 MED ORDER — DARBEPOETIN ALFA-POLYSORBATE 300 MCG/0.6ML IJ SOLN
300.0000 ug | INTRAMUSCULAR | Status: DC
  Administered 2012-05-10: 300 ug via SUBCUTANEOUS
  Filled 2012-05-10: qty 0.6

## 2012-05-20 ENCOUNTER — Encounter (HOSPITAL_COMMUNITY): Payer: Self-pay | Admitting: *Deleted

## 2012-05-20 ENCOUNTER — Emergency Department (HOSPITAL_COMMUNITY)
Admission: EM | Admit: 2012-05-20 | Discharge: 2012-05-20 | Disposition: A | Discharge status: Home / Self Care | Payer: Medicare Other | Attending: Emergency Medicine | Admitting: Emergency Medicine

## 2012-05-20 DIAGNOSIS — K219 Gastro-esophageal reflux disease without esophagitis: Secondary | ICD-10-CM | POA: Diagnosis not present

## 2012-05-20 DIAGNOSIS — I10 Essential (primary) hypertension: Secondary | ICD-10-CM | POA: Diagnosis not present

## 2012-05-20 DIAGNOSIS — Z880 Allergy status to penicillin: Secondary | ICD-10-CM | POA: Diagnosis not present

## 2012-05-20 DIAGNOSIS — E039 Hypothyroidism, unspecified: Secondary | ICD-10-CM | POA: Diagnosis not present

## 2012-05-20 DIAGNOSIS — Z882 Allergy status to sulfonamides status: Secondary | ICD-10-CM | POA: Diagnosis not present

## 2012-05-20 DIAGNOSIS — Z823 Family history of stroke: Secondary | ICD-10-CM | POA: Insufficient documentation

## 2012-05-20 DIAGNOSIS — Z87891 Personal history of nicotine dependence: Secondary | ICD-10-CM | POA: Insufficient documentation

## 2012-05-20 DIAGNOSIS — Z885 Allergy status to narcotic agent status: Secondary | ICD-10-CM | POA: Insufficient documentation

## 2012-05-20 DIAGNOSIS — R51 Headache: Secondary | ICD-10-CM | POA: Insufficient documentation

## 2012-05-20 DIAGNOSIS — Z881 Allergy status to other antibiotic agents status: Secondary | ICD-10-CM | POA: Insufficient documentation

## 2012-05-20 DIAGNOSIS — Z7982 Long term (current) use of aspirin: Secondary | ICD-10-CM | POA: Insufficient documentation

## 2012-05-20 DIAGNOSIS — Z8249 Family history of ischemic heart disease and other diseases of the circulatory system: Secondary | ICD-10-CM | POA: Diagnosis not present

## 2012-05-20 MED ORDER — ACETAMINOPHEN 325 MG PO TABS
650.0000 mg | ORAL_TABLET | Freq: Once | ORAL | Status: AC
  Administered 2012-05-20: 650 mg via ORAL
  Filled 2012-05-20: qty 2

## 2012-05-20 NOTE — ED Provider Notes (Addendum)
History     CSN: [id number]  Arrival date & time 05/20/12  0137   First MD Initiated Contact with Patient 05/20/12 0308      Chief Complaint  Patient presents with  . Hypertension    (Consider location/radiation/quality/duration/timing/severity/associated sxs/prior treatment) HPI Complains of right-sided temporal headache onset approximately 1 AM today. Patient took her blood pressure at home and noted to be [id number] systolic. She gets similar headaches approximately twice per week for the past several months.. This headache tonight was not as bad as when she's had in the past. She has been prescribed Vicodin for her headaches but did not take anything tonight. Presently headache is mild without treatment rated as a 2 on a scale of 1-10. No fever no visual changes no nausea or vomiting no other complaint no other associated symptoms pain is dull and nonradiating. She's requesting only Tylenol for pain at present Past Medical History  Diagnosis Date  . Anemia   . Hypertension   . GERD (gastroesophageal reflux disease)   . Hypothyroidism     Past Surgical History  Procedure Date  . Hernia repair   . Rotator cuff repair     Family History  Problem Relation Age of Onset  . Stroke Father   . Stroke Sister   . Coronary artery disease Brother   . Hypertension Other     History  Substance Use Topics  . Smoking status: Former Research scientist (life sciences)  . Smokeless tobacco: Not on file  . Alcohol Use: No    OB History    Grav Para Term Preterm Abortions TAB SAB Ect Mult Living                  Review of Systems  Neurological: Positive for headaches.  All other systems reviewed and are negative.    Allergies  Codeine; Penicillins; Bactrim; Clarithromycin; Flagyl; and Sulfa antibiotics  Home Medications   Current Outpatient Rx  Name Route Sig Dispense Refill  . ASPIRIN 81 MG PO CHEW Oral Chew 81 mg by mouth daily.    . ATENOLOL 100 MG PO TABS Oral Take 50 mg by mouth daily.     [name]  HYDROCHLOROTHIAZIDE 25 MG PO TABS Oral Take 25 mg by mouth daily.    [name] LEVOTHYROXINE SODIUM 100 MCG PO TABS Oral Take 100 mcg by mouth daily. 100 mcg 4 times per week and 50 mcg 3 times a week    . LISINOPRIL 20 MG PO TABS Oral Take 40 mg by mouth daily.     [name] PRAVASTATIN SODIUM 40 MG PO TABS Oral Take 40 mg by mouth daily.    [name] VITAMIN D (ERGOCALCIFEROL) PO Oral Take 10,000 Units by mouth daily.      BP 156/85  Pulse 61  Temp 98.5 F (36.9 C) (Oral)  Resp 20  SpO2 99%  Physical Exam  Nursing note and vitals reviewed. Constitutional: She is oriented to person, place, and time. She appears well-developed and well-nourished.  HENT:  Head: Normocephalic and atraumatic.  Eyes: Conjunctivae are normal. Pupils are equal, round, and reactive to light.       Discs sharp  Neck: Neck supple. No tracheal deviation present. No thyromegaly present.       No bruit  Cardiovascular: Normal rate and regular rhythm.   No murmur heard. Pulmonary/Chest: Effort normal and breath sounds normal.  Abdominal: Soft. Bowel sounds are normal. She exhibits no distension. There is no tenderness.  Musculoskeletal: Normal range of motion. She exhibits no  edema and no tenderness.  Neurological: She is alert and oriented to person, place, and time. She has normal reflexes. No cranial nerve deficit. She exhibits normal muscle tone. Coordination normal.       Gait normal Romberg normal prior drift normal  Skin: Skin is warm and dry. No rash noted.  Psychiatric: She has a normal mood and affect.    ED Course  Procedures (including critical care time)  Labs Reviewed - No data to display No results found.   No diagnosis found.    MDM  No signs of hypertensive encephalopathy Plan followup with Dr. Melford Aase this week, patient has Vicodin which she can take for bad headaches or Tylenol for mild pain antihypertensive medications may need to be adjusted. Diagnosis #1 headache #2 hypertension        Orlie Dakin, MD 05/20/12 0330  Orlie Dakin, MD 05/20/12 [phone]

## 2012-05-20 NOTE — ED Notes (Signed)
Pt states she began to have headache this past afternoon, B/P elevated when she checked at that time. Pt states headache persist and became worse as B/P continued to rise.

## 2012-05-31 ENCOUNTER — Ambulatory Visit: Payer: Medicare Other

## 2012-05-31 ENCOUNTER — Other Ambulatory Visit (HOSPITAL_BASED_OUTPATIENT_CLINIC_OR_DEPARTMENT_OTHER): Payer: Medicare Other | Admitting: Lab

## 2012-05-31 DIAGNOSIS — D649 Anemia, unspecified: Secondary | ICD-10-CM | POA: Diagnosis not present

## 2012-05-31 LAB — CBC WITH DIFFERENTIAL/PLATELET
BASO%: 0.3 % (ref 0.0–2.0)
EOS%: 2.3 % (ref 0.0–7.0)
MCH: 29.7 pg (ref 25.1–34.0)
MCHC: 34.7 g/dL (ref 31.5–36.0)
MONO#: 0.2 10*3/uL (ref 0.1–0.9)
RBC: 3.9 10*6/uL (ref 3.70–5.45)
WBC: 3.6 10*3/uL — ABNORMAL LOW (ref 3.9–10.3)
lymph#: 2.1 10*3/uL (ref 0.9–3.3)

## 2012-05-31 MED ORDER — DARBEPOETIN ALFA-POLYSORBATE 300 MCG/0.6ML IJ SOLN: 300.0000 ug | INTRAMUSCULAR | Status: DC

## 2012-06-08 DIAGNOSIS — Z79899 Other long term (current) drug therapy: Secondary | ICD-10-CM | POA: Diagnosis not present

## 2012-06-08 DIAGNOSIS — E559 Vitamin D deficiency, unspecified: Secondary | ICD-10-CM | POA: Diagnosis not present

## 2012-06-08 DIAGNOSIS — I10 Essential (primary) hypertension: Secondary | ICD-10-CM | POA: Diagnosis not present

## 2012-06-08 DIAGNOSIS — E782 Mixed hyperlipidemia: Secondary | ICD-10-CM | POA: Diagnosis not present

## 2012-06-08 DIAGNOSIS — E039 Hypothyroidism, unspecified: Secondary | ICD-10-CM | POA: Diagnosis not present

## 2012-06-19 ENCOUNTER — Emergency Department (HOSPITAL_COMMUNITY)
Admission: EM | Admit: 2012-06-19 | Discharge: 2012-06-20 | Disposition: A | Discharge status: Home / Self Care | Payer: Medicare Other | Attending: Emergency Medicine | Admitting: Emergency Medicine

## 2012-06-19 ENCOUNTER — Emergency Department (HOSPITAL_COMMUNITY): Payer: Medicare Other

## 2012-06-19 ENCOUNTER — Encounter (HOSPITAL_COMMUNITY): Payer: Self-pay | Admitting: *Deleted

## 2012-06-19 DIAGNOSIS — Z79899 Other long term (current) drug therapy: Secondary | ICD-10-CM | POA: Diagnosis not present

## 2012-06-19 DIAGNOSIS — I10 Essential (primary) hypertension: Secondary | ICD-10-CM | POA: Diagnosis not present

## 2012-06-19 DIAGNOSIS — R079 Chest pain, unspecified: Secondary | ICD-10-CM | POA: Diagnosis not present

## 2012-06-19 DIAGNOSIS — E039 Hypothyroidism, unspecified: Secondary | ICD-10-CM | POA: Diagnosis not present

## 2012-06-19 DIAGNOSIS — R42 Dizziness and giddiness: Secondary | ICD-10-CM | POA: Diagnosis not present

## 2012-06-19 DIAGNOSIS — R0602 Shortness of breath: Secondary | ICD-10-CM | POA: Diagnosis not present

## 2012-06-19 DIAGNOSIS — R51 Headache: Secondary | ICD-10-CM | POA: Insufficient documentation

## 2012-06-19 LAB — TROPONIN I: Troponin I: <0.3 ng/mL (ref <0.30)

## 2012-06-19 LAB — COMPREHENSIVE METABOLIC PANEL
AST: 17 U/L (ref 0–37)
Albumin: 3.7 g/dL (ref 3.5–5.2)
BUN: 20 mg/dL (ref 6–23)
CO2: 25 mEq/L (ref 19–32)
Calcium: 9.4 mg/dL (ref 8.4–10.5)
Creatinine, Ser: 1.28 mg/dL — ABNORMAL HIGH (ref 0.50–1.10)
GFR calc non Af Amer: 41 mL/min — ABNORMAL LOW (ref >90)

## 2012-06-19 LAB — CBC WITH DIFFERENTIAL/PLATELET
Basophils Absolute: 0 10*3/uL (ref 0.0–0.1)
Basophils Relative: 0 % (ref 0–1)
Eosinophils Relative: 2 % (ref 0–5)
HCT: 27.8 % — ABNORMAL LOW (ref 36.0–46.0)
MCHC: 34.9 g/dL (ref 30.0–36.0)
MCV: 84.2 fL (ref 78.0–100.0)
Monocytes Absolute: 0.3 10*3/uL (ref 0.1–1.0)
RDW: 14.3 % (ref 11.5–15.5)

## 2012-06-19 NOTE — ED Notes (Signed)
Pt c/o elevated blood pressure; severe headache all day; feeling a "fullness" in her chest since 1800; took 1/2 vicodin for headache; states no better

## 2012-06-20 ENCOUNTER — Emergency Department (HOSPITAL_COMMUNITY): Payer: Medicare Other

## 2012-06-20 DIAGNOSIS — R51 Headache: Secondary | ICD-10-CM | POA: Diagnosis not present

## 2012-06-20 LAB — TROPONIN I: Troponin I: <0.3 ng/mL (ref <0.30)

## 2012-06-20 MED ORDER — KETOROLAC TROMETHAMINE 30 MG/ML IJ SOLN: 30.0000 mg | Freq: Once | INTRAMUSCULAR | Status: DC

## 2012-06-20 MED ORDER — KETOROLAC TROMETHAMINE 30 MG/ML IJ SOLN
30.0000 mg | Freq: Once | INTRAMUSCULAR | Status: AC
  Administered 2012-06-20: 30 mg via INTRAVENOUS
  Filled 2012-06-20: qty 1

## 2012-06-20 MED ORDER — POTASSIUM CHLORIDE CRYS ER 20 MEQ PO TBCR
20.0000 meq | EXTENDED_RELEASE_TABLET | Freq: Once | ORAL | Status: AC
  Administered 2012-06-20: 20 meq via ORAL
  Filled 2012-06-20: qty 1

## 2012-06-20 NOTE — ED Notes (Signed)
Pt placed on cardiac monitor 

## 2012-06-20 NOTE — ED Notes (Signed)
Patient transported to CT 

## 2012-06-20 NOTE — ED Notes (Signed)
Pt given warm blanket.

## 2012-06-20 NOTE — ED Provider Notes (Signed)
History     CSN: [id number]  Arrival date & time 06/19/12  2259   First MD Initiated Contact with Patient 06/20/12 0021      Chief Complaint  Patient presents with  . Chest Pain    (Consider location/radiation/quality/duration/timing/severity/associated sxs/prior treatment) HPI Comments: Is what is a 71 year old female with a history of hypertension, who is followed by Dr. Vicente Serene.  Recently had her medications, increased from 200 mg labetalol twice a day to 300 with the addition of lisinopril 20 mg twice a day.  Her blood pressure has still been on a very high side.  Today, she developed a global headache with dizziness.  No nausea, vomiting, visual change, weakness in extremities.  She, states she took one half a Vicodin tablet for the headache, without resolution.  Patient is a 71 y.o. female presenting with chest pain. The history is provided by the patient.  Chest Pain The chest pain began more than 2 weeks ago. Chest pain occurs constantly. The chest pain is worsening. At its most intense, the pain is at 5/10. The pain is currently at 5/10. The severity of the pain is moderate. The quality of the pain is described as heavy. The pain does not radiate. Primary symptoms include shortness of breath and dizziness. Pertinent negatives for primary symptoms include no fever, no cough and no nausea.  Dizziness does not occur with nausea.     Past Medical History  Diagnosis Date  . Anemia   . Hypertension   . GERD (gastroesophageal reflux disease)   . Hypothyroidism     Past Surgical History  Procedure Date  . Hernia repair   . Rotator cuff repair     Family History  Problem Relation Age of Onset  . Stroke Father   . Stroke Sister   . Coronary artery disease Brother   . Hypertension Other     History  Substance Use Topics  . Smoking status: Former Research scientist (life sciences)  . Smokeless tobacco: Not on file  . Alcohol Use: No    OB History    Grav Para Term Preterm Abortions TAB SAB Ect  Mult Living                  Review of Systems  Constitutional: Negative for fever and chills.  Eyes: Negative for visual disturbance.  Respiratory: Positive for shortness of breath. Negative for cough.   Cardiovascular: Positive for chest pain. Negative for leg swelling.  Gastrointestinal: Negative for nausea.  Musculoskeletal: Negative for myalgias.  Skin: Negative for wound.  Neurological: Positive for dizziness and headaches.    Allergies  Codeine; Penicillins; Bactrim; Clarithromycin; Flagyl; and Sulfa antibiotics  Home Medications   Current Outpatient Rx  Name Route Sig Dispense Refill  . ASPIRIN 81 MG PO CHEW Oral Chew 81 mg by mouth every morning.     [name] VITAMIN D-3 5000 UNITS PO TABS Oral Take 10,000 Units by mouth daily.    Kyra Searles IJ Injection Inject as directed.    [name] HYDROCHLOROTHIAZIDE 25 MG PO TABS Oral Take 25 mg by mouth daily.    [name] HYDROCHLOROTHIAZIDE 12.5 MG PO CAPS Oral Take 12.5 mg by mouth every morning.    [name] HYDROCODONE-ACETAMINOPHEN 5-325 MG PO TABS Oral Take 1 tablet by mouth every 6 (six) hours as needed. For headaches    . LABETALOL HCL 200 MG PO TABS Oral Take 300 mg by mouth 2 (two) times daily.    [name] LEVOTHYROXINE SODIUM 100 MCG PO TABS Oral Take  50-100 mcg by mouth daily. Take 50 mcg three times weekly on Monday,wednesdays and fridays. Take 100 mcg on all other days    . LISINOPRIL 20 MG PO TABS Oral Take 40 mg by mouth daily.     [name] OMEPRAZOLE 20 MG PO CPDR Oral Take 20 mg by mouth every morning.    [name] PRAVASTATIN SODIUM 40 MG PO TABS Oral Take 40 mg by mouth at bedtime.       BP 153/97  Pulse 83  Temp 98.7 F (37.1 C) (Oral)  Resp 18  SpO2 99%  Physical Exam  Constitutional: She appears well-developed and well-nourished.  Eyes: Pupils are equal, round, and reactive to light.  Neck: Normal range of motion.  Cardiovascular: Normal rate.   Pulmonary/Chest: Effort normal and breath sounds normal. No respiratory distress. She has no wheezes.    Abdominal: Soft. She exhibits no distension. There is no tenderness.  Musculoskeletal: Normal range of motion. She exhibits no edema and no tenderness.  Neurological: She is alert.  Skin: Skin is warm. No rash noted.    ED Course  Procedures (including critical care time)  Labs Reviewed  CBC WITH DIFFERENTIAL - Abnormal; Notable for the following:    WBC 3.4 (*)     RBC 3.30 (*)     Hemoglobin 9.7 (*)     HCT 27.8 (*)     All other components within normal limits  COMPREHENSIVE METABOLIC PANEL - Abnormal; Notable for the following:    Potassium 3.2 (*)     Glucose, Bld 119 (*)     Creatinine, Ser 1.28 (*)     GFR calc non Af Amer 41 (*)     GFR calc Af Amer 47 (*)     All other components within normal limits  TROPONIN I  TROPONIN I   Dg Chest 2 View  06/20/2012  *RADIOLOGY REPORT*  Clinical Data: Chest pain.  CHEST - 2 VIEW  Comparison: PA and lateral chest 12/28/2011  Findings: Lungs are clear.  No pneumothorax or pleural fluid. Heart size upper normal.  Postoperative change left shoulder noted.  IMPRESSION: No acute finding.   Original Report Authenticated By: Arvid Right. Luther Parody, M.D.    Ct Head Wo Contrast  06/20/2012    *RADIOLOGY REPORT*  Clinical Data: Headache.  CT HEAD WITHOUT CONTRAST  Technique:  Contiguous axial images were obtained from the base of the skull through the vertex without contrast.  Comparison: None.  Findings: Chronic microvascular ischemic change is identified. There is no evidence of acute intracranial abnormality including infarction, hemorrhage, mass lesion, mass effect, midline shift or abnormal extra-axial fluid collection.  No hydrocephalus or pneumocephalus.  Calvarium intact.  IMPRESSION: No acute finding.  Chronic microvascular ischemic change.   Original Report Authenticated By: Arvid Right. D'ALESSIO, M.D.      1. Hypertension   2. Headache     ED ECG REPORT   Date: 06/20/2012  EKG Time: 6:11 AM  Rate: 73  Rhythm: normal sinus rhythm,   unchanged from previous tracings  Axis: normal  Intervals:first-degree A-V block   ST&T Change: none  Narrative Interpretation: abnormal             MDM  Patient, states she's is no longer feeling of chest fullness, as her blood pressure has come down nicely on its own without intervention.  EKG, was reviewed.  Labs reviewed.  She has had 2 sets of negative cardiac markers, her head.  CT scan is negative.  Her  chest x-ray reveals a heart size at the upper normal, but no failure or infiltrate.  She still has a slight headache, for which I have just asked the nurse to give her 30 mg of IV, Toradol, and encourage the patient to followup with her primary care physician to discuss going back to the antihypertensive that worked best for her.         Garald Balding, NP 06/20/12 [phone]

## 2012-06-20 NOTE — ED Provider Notes (Signed)
Medical screening examination/treatment/procedure(s) were conducted as a shared visit with non-physician practitioner(s) and myself.  I personally evaluated the patient during the encounter  Patient complains of gradual onset headache and hypertension over the past day. Her PCP has been adjusting her blood pressure medications. She denies any vision change, chest pain, shortness of breath, nausea or vomiting. On exam she has no focal neurological deficits. Visual fields are full to confrontation, grip strengths are equal bilaterally, no cranial nerve deficits.  Ezequiel Essex, MD 06/20/12 [phone]

## 2012-06-21 ENCOUNTER — Ambulatory Visit (HOSPITAL_BASED_OUTPATIENT_CLINIC_OR_DEPARTMENT_OTHER): Payer: Medicare Other | Admitting: Oncology

## 2012-06-21 ENCOUNTER — Other Ambulatory Visit (HOSPITAL_BASED_OUTPATIENT_CLINIC_OR_DEPARTMENT_OTHER): Payer: Medicare Other | Admitting: Lab

## 2012-06-21 ENCOUNTER — Telehealth: Payer: Self-pay | Admitting: Oncology

## 2012-06-21 VITALS — BP 110/70 | HR 82 | Temp 98.9°F | Resp 20 | Ht 62.5 in | Wt 176.1 lb

## 2012-06-21 DIAGNOSIS — I10 Essential (primary) hypertension: Secondary | ICD-10-CM | POA: Diagnosis not present

## 2012-06-21 DIAGNOSIS — D649 Anemia, unspecified: Secondary | ICD-10-CM | POA: Diagnosis not present

## 2012-06-21 DIAGNOSIS — R51 Headache: Secondary | ICD-10-CM

## 2012-06-21 DIAGNOSIS — R5381 Other malaise: Secondary | ICD-10-CM

## 2012-06-21 DIAGNOSIS — R5383 Other fatigue: Secondary | ICD-10-CM | POA: Diagnosis not present

## 2012-06-21 DIAGNOSIS — Z79899 Other long term (current) drug therapy: Secondary | ICD-10-CM | POA: Diagnosis not present

## 2012-06-21 DIAGNOSIS — N289 Disorder of kidney and ureter, unspecified: Secondary | ICD-10-CM

## 2012-06-21 DIAGNOSIS — J029 Acute pharyngitis, unspecified: Secondary | ICD-10-CM | POA: Diagnosis not present

## 2012-06-21 LAB — CBC WITH DIFFERENTIAL/PLATELET
BASO%: 0 % (ref 0.0–2.0)
HCT: 29.3 % — ABNORMAL LOW (ref 34.8–46.6)
MCHC: 34.5 g/dL (ref 31.5–36.0)
MONO#: 0.2 10*3/uL (ref 0.1–0.9)
NEUT%: 56.5 % (ref 38.4–76.8)
RBC: 3.45 10*6/uL — ABNORMAL LOW (ref 3.70–5.45)
RDW: 14.8 % — ABNORMAL HIGH (ref 11.2–14.5)
WBC: 2.8 10*3/uL — ABNORMAL LOW (ref 3.9–10.3)
lymph#: 1 10*3/uL (ref 0.9–3.3)
nRBC: 0 % (ref 0–0)

## 2012-06-21 MED ORDER — DARBEPOETIN ALFA-POLYSORBATE 300 MCG/0.6ML IJ SOLN
300.0000 ug | INTRAMUSCULAR | Status: DC
  Administered 2012-06-21: 300 ug via SUBCUTANEOUS
  Filled 2012-06-21: qty 0.6

## 2012-06-21 NOTE — Progress Notes (Signed)
Hematology and Oncology Follow Up Visit  Susan [name] [id number] [birth year] 71 y.o. 06/21/2012 10:48 AM  CC: Susan Caroli, MD   DIAGNOSIS:  This is a 71 year old woman with multifactorial anemia.  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  CURRENT THERAPY:  She is on Aranesp 300 mcg every 3 weeks to keep her hemoglobin above 11.  SECONDARY DIAGNOSIS:  Controlled hypertension.  HISTORY OF PRESENT ILLNESS:  Susan [name] presents today for a follow-up visit.  She is a very pleasant 71 year old woman without any significant comorbid conditions.  She does have history of hypertension, but, for the most part, is in reasonably good health and shape.  She is tolerating Aranesp very well and it was able to really help with her overall quality of life and her performance status. She really functions well when her hemoglobin is above 11.  The Aranesp has helped keeping doing that.  She has not had any injection-related problems. Overall, her performance status and activity level remain at a reasonable range at this time.Her only complains today are sinus congestion that seems to be improving. She has reported some fatigue at times epically when her Hgb drops down below 10. She is reporting headaches and fatigue today. She was seen in the ED 2 days ago for chest pain and has resolved. She did report some loose stools but no fevers. Overall, feels lethargic.   REVIEW OF SYSTEMS:  She does not report any headaches, blurry vision or double vision.  She does not report any motor or sensory neuropathy.  She does not report any alteration in mental status.  She does not report any psychiatric issues, depression.  She does not report any fever, chills or sweats.  She does not report any cough, hemoptysis or hematemesis.  No nausea or vomiting.  No abdominal pain, hematochezia or melena. No genitourinary complaints.  The rest review of systems is unremarkable.   Past Medical  History, Surgical history, Social history, and Family History were reviewed and updated.   Physical Exam: BP 182/92. P 64, T 99. RR 16  General Appearance:  An alert and awake female who appeared in no active distress.    HEENT:  Head is normocephalic, atraumatic.  Pupils are equal, round and reactive to light.  Oral mucosa is moist and pink.   Neck:  Supple. No lymphadenopathy.   Heart:  Regular rate and rhythm.  S1, S2.   Lungs:  Clear to auscultation.  No rhonchi, wheeze or dullness to percussion.   Abdomen:  Soft, nontender.  No hepatosplenomegaly.   Extremities:  No clubbing, cyanosis or edema.    Neurologic:  Intact motor, sensory and deep tendon reflexes.  Lab Results:   Lab 06/21/12 1014 06/19/12 2317  WBC 2.8* 3.4*  HGB 10.1* 9.7*  HCT 29.3* 27.8*  PLT 117* 154  MCV 84.9 84.2  MCH 29.3 29.4  MCHC 34.5 34.9  RDW 14.8* 14.3  LYMPHSABS 1.0 0.8  MONOABS 0.2 0.3  EOSABS 0.0 0.1  BASOSABS 0.0 0.0  BANDABS -- --      Impression and Plan:  This is a pleasant 71 year old female with the following issues:   1. Multifactorial anemia:  She has an element of anemia of renal disease as well as anemia of possible myelodysplasia.  Again, she has responded quite nicely to Aranesp.  The plan is to keep her on Aranesp 300 mcg every 3 weeks to keep her hemoglobin above 11. Patient's Hgb is 10.1  today and she will get Aransep. 2. Hypertension:  This seems to be under reasonable control and is followed by her primary care physician.   3. 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 4. Follow up on 11/14 5. Fatigue, headache, and possible loose stools. She might be developing flu like symptoms. I encouraged her to rest and keep hydratuion . She has instruction to the ED if symptoms  get worse.     [facility], MD 9/19/201310:48 AM

## 2012-06-21 NOTE — Telephone Encounter (Signed)
gv pt appt schedule for October and November.

## 2012-07-05 DIAGNOSIS — E538 Deficiency of other specified B group vitamins: Secondary | ICD-10-CM | POA: Diagnosis not present

## 2012-07-05 DIAGNOSIS — I10 Essential (primary) hypertension: Secondary | ICD-10-CM | POA: Diagnosis not present

## 2012-07-05 DIAGNOSIS — D649 Anemia, unspecified: Secondary | ICD-10-CM | POA: Diagnosis not present

## 2012-07-10 DIAGNOSIS — I10 Essential (primary) hypertension: Secondary | ICD-10-CM | POA: Diagnosis not present

## 2012-07-12 ENCOUNTER — Ambulatory Visit (HOSPITAL_BASED_OUTPATIENT_CLINIC_OR_DEPARTMENT_OTHER): Payer: Medicare Other

## 2012-07-12 ENCOUNTER — Other Ambulatory Visit (HOSPITAL_BASED_OUTPATIENT_CLINIC_OR_DEPARTMENT_OTHER): Payer: Medicare Other | Admitting: Lab

## 2012-07-12 VITALS — BP 174/87 | HR 72 | Temp 98.2°F

## 2012-07-12 DIAGNOSIS — D649 Anemia, unspecified: Secondary | ICD-10-CM

## 2012-07-12 LAB — CBC WITH DIFFERENTIAL/PLATELET
Basophils Absolute: 0.1 10*3/uL (ref 0.0–0.1)
Eosinophils Absolute: 0.1 10*3/uL (ref 0.0–0.5)
HGB: 9.2 g/dL — ABNORMAL LOW (ref 11.6–15.9)
MONO#: 0.2 10*3/uL (ref 0.1–0.9)
NEUT#: 1.8 10*3/uL (ref 1.5–6.5)
RBC: 2.96 10*6/uL — ABNORMAL LOW (ref 3.70–5.45)
RDW: 17.2 % — ABNORMAL HIGH (ref 11.2–14.5)
WBC: 3.6 10*3/uL — ABNORMAL LOW (ref 3.9–10.3)
lymph#: 1.4 10*3/uL (ref 0.9–3.3)

## 2012-07-12 MED ORDER — DARBEPOETIN ALFA-POLYSORBATE 300 MCG/0.6ML IJ SOLN
300.0000 ug | INTRAMUSCULAR | Status: DC
  Administered 2012-07-12: 300 ug via SUBCUTANEOUS
  Filled 2012-07-12: qty 0.6

## 2012-07-12 NOTE — Patient Instructions (Signed)
Call MD with any questions 

## 2012-08-02 ENCOUNTER — Ambulatory Visit (HOSPITAL_BASED_OUTPATIENT_CLINIC_OR_DEPARTMENT_OTHER): Payer: Medicare Other

## 2012-08-02 ENCOUNTER — Other Ambulatory Visit (HOSPITAL_BASED_OUTPATIENT_CLINIC_OR_DEPARTMENT_OTHER): Payer: Medicare Other | Admitting: Lab

## 2012-08-02 VITALS — BP 142/78 | HR 65 | Temp 97.1°F

## 2012-08-02 DIAGNOSIS — D649 Anemia, unspecified: Secondary | ICD-10-CM

## 2012-08-02 LAB — CBC WITH DIFFERENTIAL/PLATELET
Basophils Absolute: 0 10*3/uL (ref 0.0–0.1)
Eosinophils Absolute: 0.1 10*3/uL (ref 0.0–0.5)
HGB: 10.6 g/dL — ABNORMAL LOW (ref 11.6–15.9)
LYMPH%: 50.3 % — ABNORMAL HIGH (ref 14.0–49.7)
MCV: 90.6 fL (ref 79.5–101.0)
MONO#: 0.3 10*3/uL (ref 0.1–0.9)
MONO%: 9.3 % (ref 0.0–14.0)
NEUT#: 1.2 10*3/uL — ABNORMAL LOW (ref 1.5–6.5)
Platelets: 143 10*3/uL — ABNORMAL LOW (ref 145–400)
WBC: 3.2 10*3/uL — ABNORMAL LOW (ref 3.9–10.3)

## 2012-08-02 MED ORDER — DARBEPOETIN ALFA-POLYSORBATE 300 MCG/0.6ML IJ SOLN
300.0000 ug | INTRAMUSCULAR | Status: DC
  Administered 2012-08-02: 300 ug via SUBCUTANEOUS
  Filled 2012-08-02: qty 0.6

## 2012-08-16 ENCOUNTER — Encounter: Payer: Self-pay | Admitting: Oncology

## 2012-08-16 ENCOUNTER — Telehealth: Payer: Self-pay | Admitting: Oncology

## 2012-08-16 ENCOUNTER — Ambulatory Visit (HOSPITAL_BASED_OUTPATIENT_CLINIC_OR_DEPARTMENT_OTHER): Payer: Medicare Other | Admitting: Oncology

## 2012-08-16 ENCOUNTER — Other Ambulatory Visit (HOSPITAL_BASED_OUTPATIENT_CLINIC_OR_DEPARTMENT_OTHER): Payer: Medicare Other | Admitting: Lab

## 2012-08-16 VITALS — BP 152/87 | HR 67 | Temp 96.7°F | Resp 20 | Ht 62.5 in | Wt 172.6 lb

## 2012-08-16 DIAGNOSIS — D649 Anemia, unspecified: Secondary | ICD-10-CM | POA: Diagnosis not present

## 2012-08-16 DIAGNOSIS — I10 Essential (primary) hypertension: Secondary | ICD-10-CM

## 2012-08-16 LAB — CBC WITH DIFFERENTIAL/PLATELET
Basophils Absolute: 0 10*3/uL (ref 0.0–0.1)
EOS%: 2.6 % (ref 0.0–7.0)
Eosinophils Absolute: 0.1 10*3/uL (ref 0.0–0.5)
HCT: 34.7 % — ABNORMAL LOW (ref 34.8–46.6)
HGB: 11.6 g/dL (ref 11.6–15.9)
MCH: 29.4 pg (ref 25.1–34.0)
MCV: 87.8 fL (ref 79.5–101.0)
MONO%: 8.5 % (ref 0.0–14.0)
NEUT#: 2.1 10*3/uL (ref 1.5–6.5)
NEUT%: 53.4 % (ref 38.4–76.8)
RDW: 16.2 % — ABNORMAL HIGH (ref 11.2–14.5)
lymph#: 1.4 10*3/uL (ref 0.9–3.3)

## 2012-08-16 NOTE — Telephone Encounter (Signed)
gv and printed pt appt schedule for Dec thru Feb..[name]

## 2012-08-16 NOTE — Telephone Encounter (Signed)
Error

## 2012-08-16 NOTE — Progress Notes (Signed)
Hematology and Oncology Follow Up Visit  Susan [name] [id number] 08-07-1941 71 y.o. 08/16/2012 9:46 AM Susan [name], MDMcKeown, [name], MD   Principle Diagnosis: This is a 71 year old woman with multifactorial anemia. 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  Secondary Diagnosis: Controlled hypertension.  Current therapy: She is on Aranesp 300 mcg every 3 weeks to keep her hemoglobin above 11.  Interim History:  Susan [name] presents today for a follow-up visit. She is a very pleasant 71 year old woman without any significant comorbid conditions. She does have history of hypertension, but, for the most part, is in reasonably good health and shape. She is tolerating Aranesp very well and it was able to really help with her overall quality of life and her performance status. She really functions well when her hemoglobin is above 11. The Aranesp has helped keeping doing that. She has not had any injection-related problems. Overall, her performance status and activity level remain at a reasonable range at this time. She has reported some fatigue at times especially when her Hgb drops down below 10. No bleeding noted.  Medications: I have reviewed the patient's current medications. Current outpatient prescriptions:aspirin 81 MG chewable tablet, Chew 81 mg by mouth every morning. , Disp: , Rfl: ;  Cholecalciferol (VITAMIN D-3) 5000 UNITS TABS, Take 10,000 Units by mouth daily., Disp: , Rfl: ;  Darbepoetin Alfa-Albumin (ARANESP IJ), Inject as directed., Disp: , Rfl: ;  hydrochlorothiazide (HYDRODIURIL) 25 MG tablet, Take 25 mg by mouth daily., Disp: , Rfl:  HYDROcodone-acetaminophen (NORCO/VICODIN) 5-325 MG per tablet, Take 1 tablet by mouth every 6 (six) hours as needed. For headaches, Disp: , Rfl: ;  labetalol (NORMODYNE) 200 MG tablet, Take 300 mg by mouth 2 (two) times daily., Disp: , Rfl: ;  levothyroxine (SYNTHROID, LEVOTHROID) 100 MCG tablet, Take 50-100  mcg by mouth daily. Take 50 mcg three times weekly on Monday,wednesdays and fridays. Take 100 mcg on all other days, Disp: , Rfl:  lisinopril (PRINIVIL,ZESTRIL) 20 MG tablet, Take 20 mg by mouth daily. , Disp: , Rfl: ;  omeprazole (PRILOSEC) 20 MG capsule, Take 20 mg by mouth every morning., Disp: , Rfl: ;  pravastatin (PRAVACHOL) 40 MG tablet, Take 40 mg by mouth at bedtime. , Disp: , Rfl:   Allergies:  Allergies  Allergen Reactions  . Codeine Anaphylaxis  . Penicillins Anaphylaxis  . Bactrim Itching and Swelling  . Clarithromycin Other (See Comments)    "Caused skin to peel off my hand."  . Flagyl (Metronidazole Hcl) Itching and Swelling  . Sulfa Antibiotics Itching and Swelling    Past Medical History, Surgical history, Social history, and Family History were reviewed and updated.  Review of Systems: Constitutional:  Negative for fever, chills, night sweats, anorexia, weight loss, pain. Cardiovascular: no chest pain or dyspnea on exertion Respiratory: no cough, shortness of breath, or wheezing Neurological: no TIA or stroke symptoms Dermatological: negative ENT: negative Skin: Negative. Gastrointestinal: no abdominal pain, change in bowel habits, or black or bloody stools Genito-Urinary: no dysuria, trouble voiding, or hematuria Hematological and Lymphatic: negative Breast: negative for breast lumps Musculoskeletal: negative Remaining ROS negative. Physical Exam: Blood pressure 152/87, pulse 67, temperature 96.7 F (35.9 C), temperature source Oral, resp. rate 20, height 5' 2.5" (1.588 m), weight 172 lb 9.6 oz (78.291 kg). ECOG: 1 General appearance: alert, cooperative and no distress Head: Normocephalic, without obvious abnormality, atraumatic Neck: no adenopathy, no carotid bruit, no JVD, supple, symmetrical, trachea midline and thyroid not enlarged,  symmetric, no tenderness/mass/nodules Lymph nodes: Cervical, supraclavicular, and axillary nodes normal. Heart:regular rate  and rhythm, S1, S2 normal, no murmur, click, rub or gallop Lung:chest clear, no wheezing, rales, normal symmetric air entry Abdomen: soft, non-tender, without masses or organomegaly EXT:no erythema, induration, or nodules   Lab Results: Lab Results  Component Value Date   WBC 3.9 08/16/2012   HGB 11.6 08/16/2012   HCT 34.7* 08/16/2012   MCV 87.8 08/16/2012   PLT 192 08/16/2012     Chemistry      Component Value Date/Time   NA 136 06/19/2012 2317   K 3.2* 06/19/2012 2317   CL 101 06/19/2012 2317   CO2 25 06/19/2012 2317   BUN 20 06/19/2012 2317   CREATININE 1.28* 06/19/2012 2317      Component Value Date/Time   CALCIUM 9.4 06/19/2012 2317   ALKPHOS 82 06/19/2012 2317   AST 17 06/19/2012 2317   ALT 8 06/19/2012 2317   BILITOT 0.4 06/19/2012 2317     Impression and Plan: This is a pleasant 71 year old female with the following issues:  1. Multifactorial anemia: She has an element of anemia of renal disease as well as anemia of possible myelodysplasia. Again, she has responded quite nicely to Aranesp. The plan is to keep her on Aranesp 300 mcg every 3 weeks to keep her hemoglobin above 11. Patient's Hgb is 11.6 today and Aranesp will not be given. 2. Hypertension: Elevated today. Patient states it fluctuates and PCP is adjusting medications. She has a f/u with PCP next week.  3. 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 4. Follow up. Every 3 weeks for CBC and injection. Visit in about 12 weeks.   Spent more than half the time coordinating care.    Susan [name], [place] 11/14/20139:46 AM

## 2012-08-23 ENCOUNTER — Ambulatory Visit: Payer: Medicare Other

## 2012-08-23 ENCOUNTER — Other Ambulatory Visit: Payer: Medicare Other | Admitting: Lab

## 2012-08-23 DIAGNOSIS — S43499A Other sprain of unspecified shoulder joint, initial encounter: Secondary | ICD-10-CM | POA: Diagnosis not present

## 2012-08-23 DIAGNOSIS — R7309 Other abnormal glucose: Secondary | ICD-10-CM | POA: Diagnosis not present

## 2012-08-23 DIAGNOSIS — Z79899 Other long term (current) drug therapy: Secondary | ICD-10-CM | POA: Diagnosis not present

## 2012-08-23 DIAGNOSIS — E782 Mixed hyperlipidemia: Secondary | ICD-10-CM | POA: Diagnosis not present

## 2012-08-23 DIAGNOSIS — S46819A Strain of other muscles, fascia and tendons at shoulder and upper arm level, unspecified arm, initial encounter: Secondary | ICD-10-CM | POA: Diagnosis not present

## 2012-08-23 DIAGNOSIS — E559 Vitamin D deficiency, unspecified: Secondary | ICD-10-CM | POA: Diagnosis not present

## 2012-08-23 DIAGNOSIS — I10 Essential (primary) hypertension: Secondary | ICD-10-CM | POA: Diagnosis not present

## 2012-09-06 ENCOUNTER — Other Ambulatory Visit (HOSPITAL_BASED_OUTPATIENT_CLINIC_OR_DEPARTMENT_OTHER): Payer: Medicare Other | Admitting: Lab

## 2012-09-06 ENCOUNTER — Ambulatory Visit: Payer: Medicare Other

## 2012-09-06 DIAGNOSIS — D649 Anemia, unspecified: Secondary | ICD-10-CM

## 2012-09-06 LAB — CBC WITH DIFFERENTIAL/PLATELET
Basophils Absolute: 0 10*3/uL (ref 0.0–0.1)
EOS%: 3.1 % (ref 0.0–7.0)
Eosinophils Absolute: 0.1 10*3/uL (ref 0.0–0.5)
HGB: 11.2 g/dL — ABNORMAL LOW (ref 11.6–15.9)
LYMPH%: 40.3 % (ref 14.0–49.7)
MCH: 30 pg (ref 25.1–34.0)
MCV: 86.2 fL (ref 79.5–101.0)
MONO%: 6.5 % (ref 0.0–14.0)
NEUT#: 2 10*3/uL (ref 1.5–6.5)
Platelets: 134 10*3/uL — ABNORMAL LOW (ref 145–400)

## 2012-09-06 MED ORDER — DARBEPOETIN ALFA-POLYSORBATE 300 MCG/0.6ML IJ SOLN: 300.0000 ug | INTRAMUSCULAR | Status: DC

## 2012-09-25 ENCOUNTER — Telehealth: Payer: Self-pay | Admitting: Oncology

## 2012-09-25 NOTE — Telephone Encounter (Signed)
returned pt call concerning nxt appt....advised pt on d/t...and call back if need be

## 2012-09-25 NOTE — Telephone Encounter (Signed)
pt called and needed to r/s lab and inj./.....done

## 2012-09-27 ENCOUNTER — Other Ambulatory Visit: Payer: Medicare Other | Admitting: Lab

## 2012-09-27 ENCOUNTER — Ambulatory Visit: Payer: Medicare Other

## 2012-10-04 ENCOUNTER — Ambulatory Visit (HOSPITAL_BASED_OUTPATIENT_CLINIC_OR_DEPARTMENT_OTHER): Payer: Medicare Other

## 2012-10-04 ENCOUNTER — Telehealth: Payer: Self-pay | Admitting: Oncology

## 2012-10-04 ENCOUNTER — Other Ambulatory Visit (HOSPITAL_BASED_OUTPATIENT_CLINIC_OR_DEPARTMENT_OTHER): Payer: Medicare Other | Admitting: Lab

## 2012-10-04 VITALS — BP 111/70 | HR 60 | Temp 97.4°F

## 2012-10-04 DIAGNOSIS — D649 Anemia, unspecified: Secondary | ICD-10-CM

## 2012-10-04 LAB — CBC WITH DIFFERENTIAL/PLATELET
BASO%: 0.4 % (ref 0.0–2.0)
EOS%: 2.8 % (ref 0.0–7.0)
LYMPH%: 46.1 % (ref 14.0–49.7)
MCH: 29.4 pg (ref 25.1–34.0)
MCHC: 35 g/dL (ref 31.5–36.0)
MCV: 84.1 fL (ref 79.5–101.0)
MONO%: 9.5 % (ref 0.0–14.0)
Platelets: 137 10*3/uL — ABNORMAL LOW (ref 145–400)
RBC: 3.15 10*6/uL — ABNORMAL LOW (ref 3.70–5.45)
RDW: 15.4 % — ABNORMAL HIGH (ref 11.2–14.5)

## 2012-10-04 MED ORDER — DARBEPOETIN ALFA-POLYSORBATE 300 MCG/0.6ML IJ SOLN
300.0000 ug | INTRAMUSCULAR | Status: DC
  Administered 2012-10-04: 300 ug via SUBCUTANEOUS
  Filled 2012-10-04: qty 0.6

## 2012-10-04 NOTE — Telephone Encounter (Signed)
Pt came in and needed to revise lab and inj bc she had missed appt...Marland Kitchenfixed

## 2012-10-17 DIAGNOSIS — E782 Mixed hyperlipidemia: Secondary | ICD-10-CM | POA: Diagnosis not present

## 2012-10-17 DIAGNOSIS — E559 Vitamin D deficiency, unspecified: Secondary | ICD-10-CM | POA: Diagnosis not present

## 2012-10-17 DIAGNOSIS — I10 Essential (primary) hypertension: Secondary | ICD-10-CM | POA: Diagnosis not present

## 2012-10-18 ENCOUNTER — Other Ambulatory Visit: Payer: Medicare Other | Admitting: Lab

## 2012-10-18 ENCOUNTER — Ambulatory Visit: Payer: Medicare Other

## 2012-10-25 ENCOUNTER — Ambulatory Visit (HOSPITAL_BASED_OUTPATIENT_CLINIC_OR_DEPARTMENT_OTHER): Payer: Medicare Other

## 2012-10-25 ENCOUNTER — Other Ambulatory Visit (HOSPITAL_BASED_OUTPATIENT_CLINIC_OR_DEPARTMENT_OTHER): Payer: Medicare Other | Admitting: Lab

## 2012-10-25 VITALS — BP 155/87 | HR 68 | Temp 98.2°F

## 2012-10-25 DIAGNOSIS — D649 Anemia, unspecified: Secondary | ICD-10-CM

## 2012-10-25 LAB — CBC WITH DIFFERENTIAL/PLATELET
BASO%: 0.5 % (ref 0.0–2.0)
LYMPH%: 52.8 % — ABNORMAL HIGH (ref 14.0–49.7)
MCHC: 34.3 g/dL (ref 31.5–36.0)
MONO#: 0.3 10*3/uL (ref 0.1–0.9)
Platelets: 138 10*3/uL — ABNORMAL LOW (ref 145–400)
RBC: 3.33 10*6/uL — ABNORMAL LOW (ref 3.70–5.45)
RDW: 16.6 % — ABNORMAL HIGH (ref 11.2–14.5)
WBC: 3.1 10*3/uL — ABNORMAL LOW (ref 3.9–10.3)
lymph#: 1.6 10*3/uL (ref 0.9–3.3)

## 2012-10-25 MED ORDER — DARBEPOETIN ALFA-POLYSORBATE 300 MCG/0.6ML IJ SOLN
300.0000 ug | INTRAMUSCULAR | Status: DC
  Administered 2012-10-25: 300 ug via SUBCUTANEOUS
  Filled 2012-10-25: qty 0.6

## 2012-11-08 ENCOUNTER — Ambulatory Visit: Payer: Medicare Other | Admitting: Oncology

## 2012-11-08 ENCOUNTER — Other Ambulatory Visit: Payer: Medicare Other | Admitting: Lab

## 2012-11-08 ENCOUNTER — Ambulatory Visit: Payer: Medicare Other

## 2012-11-16 ENCOUNTER — Ambulatory Visit: Payer: Medicare Other

## 2012-11-16 ENCOUNTER — Other Ambulatory Visit (HOSPITAL_BASED_OUTPATIENT_CLINIC_OR_DEPARTMENT_OTHER): Payer: Medicare Other

## 2012-11-16 ENCOUNTER — Ambulatory Visit (HOSPITAL_BASED_OUTPATIENT_CLINIC_OR_DEPARTMENT_OTHER): Payer: Medicare Other | Admitting: Oncology

## 2012-11-16 ENCOUNTER — Telehealth: Payer: Self-pay | Admitting: Oncology

## 2012-11-16 VITALS — BP 164/83 | HR 58 | Temp 97.1°F | Wt 171.4 lb

## 2012-11-16 DIAGNOSIS — D649 Anemia, unspecified: Secondary | ICD-10-CM | POA: Diagnosis not present

## 2012-11-16 LAB — CBC WITH DIFFERENTIAL/PLATELET
Basophils Absolute: 0 10*3/uL (ref 0.0–0.1)
EOS%: 3 % (ref 0.0–7.0)
HGB: 11.6 g/dL (ref 11.6–15.9)
MCH: 29.1 pg (ref 25.1–34.0)
MCV: 86.6 fL (ref 79.5–101.0)
MONO%: 7.7 % (ref 0.0–14.0)
RBC: 4 10*6/uL (ref 3.70–5.45)
RDW: 15.8 % — ABNORMAL HIGH (ref 11.2–14.5)

## 2012-11-16 MED ORDER — DARBEPOETIN ALFA-POLYSORBATE 300 MCG/0.6ML IJ SOLN: 300.0000 ug | INTRAMUSCULAR | Status: DC

## 2012-11-16 NOTE — Telephone Encounter (Signed)
gv and printed appt schedule for pt for Mar, April and May.[name]

## 2012-11-16 NOTE — Progress Notes (Signed)
Hematology and Oncology Follow Up Visit  Susan [name] [id number] 1941/09/13 72 y.o. 11/16/2012 1:39 PM Susan [name], MDMcKeown, [name], MD   Principle Diagnosis: This is a 72 year old woman with multifactorial anemia. 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  Secondary Diagnosis: Controlled hypertension.  Current therapy: She is on Aranesp 300 mcg every 3 weeks to keep her hemoglobin above 11.  Interim History:  Susan [name] presents today for a follow-up visit. She is a very pleasant 72 year old woman with history of hypertension, but, for the most part, is in reasonably good health and shape. She is tolerating Aranesp very well and it was able to really help with her overall quality of life and her performance status. Her counts were low in the last few months and she felt weak and dizzy but now doing better. She really functions well when her hemoglobin is above 11. The Aranesp has helped keeping doing that. She has not had any injection-related problems. Overall, her performance status and activity level remain at a reasonable range at this time. She had a recent sinus infection that is improving now.   Medications: I have reviewed the patient's current medications. Current outpatient prescriptions:aspirin 81 MG chewable tablet, Chew 81 mg by mouth every morning. , Disp: , Rfl: ;  Cholecalciferol (VITAMIN D-3) 5000 UNITS TABS, Take 10,000 Units by mouth daily., Disp: , Rfl: ;  Darbepoetin Alfa-Albumin (ARANESP IJ), Inject as directed., Disp: , Rfl: ;  diclofenac (VOLTAREN) 75 MG EC tablet, Take 75 mg by mouth 2 (two) times daily., Disp: , Rfl:  hydrochlorothiazide (HYDRODIURIL) 25 MG tablet, Take 25 mg by mouth daily., Disp: , Rfl: ;  HYDROcodone-acetaminophen (NORCO/VICODIN) 5-325 MG per tablet, Take 1 tablet by mouth every 6 (six) hours as needed. For headaches, Disp: , Rfl: ;  labetalol (NORMODYNE) 200 MG tablet, Take 300 mg by mouth 2 (two) times  daily., Disp: , Rfl:  levothyroxine (SYNTHROID, LEVOTHROID) 100 MCG tablet, Take 50-100 mcg by mouth daily. Take 50 mcg three times weekly on Monday,wednesdays and fridays. Take 100 mcg on all other days, Disp: , Rfl: ;  lisinopril (PRINIVIL,ZESTRIL) 20 MG tablet, Take 20 mg by mouth daily. , Disp: , Rfl: ;  omeprazole (PRILOSEC) 20 MG capsule, Take 20 mg by mouth every morning., Disp: , Rfl:  pravastatin (PRAVACHOL) 40 MG tablet, Take 40 mg by mouth at bedtime. , Disp: , Rfl:   Allergies:  Allergies  Allergen Reactions  . Codeine Anaphylaxis  . Penicillins Anaphylaxis  . Bactrim Itching and Swelling  . Clarithromycin Other (See Comments)    "Caused skin to peel off my hand."  . Flagyl (Metronidazole Hcl) Itching and Swelling  . Sulfa Antibiotics Itching and Swelling    Past Medical History, Surgical history, Social history, and Family History were reviewed and updated.  Review of Systems: Constitutional:  Negative for fever, chills, night sweats, anorexia, weight loss, pain. Cardiovascular: no chest pain or dyspnea on exertion Respiratory: no cough, shortness of breath, or wheezing Neurological: no TIA or stroke symptoms Dermatological: negative ENT: negative Skin: Negative. Gastrointestinal: no abdominal pain, change in bowel habits, or black or bloody stools Genito-Urinary: no dysuria, trouble voiding, or hematuria Hematological and Lymphatic: negative Breast: negative for breast lumps Musculoskeletal: negative Remaining ROS negative. Physical Exam: Blood pressure 164/83, pulse 58, temperature 97.1 F (36.2 C), temperature source Oral, weight 171 lb 6 oz (77.735 kg). ECOG: 1 General appearance: alert, cooperative and no distress Head: Normocephalic, without obvious abnormality, atraumatic  Neck: no adenopathy, no carotid bruit, no JVD, supple, symmetrical, trachea midline and thyroid not enlarged, symmetric, no tenderness/mass/nodules Lymph nodes: Cervical, supraclavicular,  and axillary nodes normal. Heart:regular rate and rhythm, S1, S2 normal, no murmur, click, rub or gallop Lung:chest clear, no wheezing, rales, normal symmetric air entry Abdomen: soft, non-tender, without masses or organomegaly EXT:no erythema, induration, or nodules   Lab Results: Lab Results  Component Value Date   WBC 3.4* 11/16/2012   HGB 11.6 11/16/2012   HCT 34.6* 11/16/2012   MCV 86.6 11/16/2012   PLT 135* 11/16/2012     Chemistry      Component Value Date/Time   NA 136 06/19/2012 2317   K 3.2* 06/19/2012 2317   CL 101 06/19/2012 2317   CO2 25 06/19/2012 2317   BUN 20 06/19/2012 2317   CREATININE 1.28* 06/19/2012 2317      Component Value Date/Time   CALCIUM 9.4 06/19/2012 2317   ALKPHOS 82 06/19/2012 2317   AST 17 06/19/2012 2317   ALT 8 06/19/2012 2317   BILITOT 0.4 06/19/2012 2317     Impression and Plan: This is a pleasant 72 year old female with the following issues:  1. Multifactorial anemia: She has an element of anemia of renal disease as well as anemia of possible myelodysplasia. Again, she has responded quite nicely to Aranesp with Hgb today that is normal to 11.6. The plan is to keep her on Aranesp 300 mcg every 3 weeks to keep her hemoglobin above 11. Patient's Hgb is 11.6 today and Aranesp will not be given. 2. Hypertension: Elevated today. Patient states it fluctuates at times.  3. 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 4. Follow up. Every 3 weeks for CBC and injection. Visit in about 12 weeks.      [facility] 2/14/20141:39 PM

## 2012-12-06 DIAGNOSIS — M899 Disorder of bone, unspecified: Secondary | ICD-10-CM | POA: Diagnosis not present

## 2012-12-06 LAB — HM DEXA SCAN: HM DEXA SCAN: NORMAL

## 2012-12-06 LAB — HM MAMMOGRAPHY

## 2012-12-07 ENCOUNTER — Ambulatory Visit: Payer: Medicare Other

## 2012-12-07 ENCOUNTER — Telehealth: Payer: Self-pay | Admitting: *Deleted

## 2012-12-07 ENCOUNTER — Other Ambulatory Visit: Payer: Medicare Other | Admitting: Lab

## 2012-12-07 NOTE — Telephone Encounter (Signed)
Called patient and left message to call back and reschedule.

## 2012-12-10 ENCOUNTER — Other Ambulatory Visit (HOSPITAL_BASED_OUTPATIENT_CLINIC_OR_DEPARTMENT_OTHER): Payer: Medicare Other

## 2012-12-10 ENCOUNTER — Ambulatory Visit (HOSPITAL_BASED_OUTPATIENT_CLINIC_OR_DEPARTMENT_OTHER): Payer: Medicare Other

## 2012-12-10 VITALS — BP 156/92 | HR 62

## 2012-12-10 DIAGNOSIS — D649 Anemia, unspecified: Secondary | ICD-10-CM

## 2012-12-10 LAB — CBC WITH DIFFERENTIAL/PLATELET
Basophils Absolute: 0 10*3/uL (ref 0.0–0.1)
EOS%: 2.4 % (ref 0.0–7.0)
Eosinophils Absolute: 0.1 10*3/uL (ref 0.0–0.5)
HGB: 10.7 g/dL — ABNORMAL LOW (ref 11.6–15.9)
NEUT#: 2.2 10*3/uL (ref 1.5–6.5)
RDW: 15.1 % — ABNORMAL HIGH (ref 11.2–14.5)
lymph#: 2 10*3/uL (ref 0.9–3.3)

## 2012-12-10 MED ORDER — DARBEPOETIN ALFA-POLYSORBATE 300 MCG/0.6ML IJ SOLN
300.0000 ug | INTRAMUSCULAR | Status: DC
  Administered 2012-12-10: 300 ug via SUBCUTANEOUS
  Filled 2012-12-10: qty 0.6

## 2012-12-10 NOTE — Patient Instructions (Signed)
Darbepoetin Alfa injection What is this medicine? DARBEPOETIN ALFA (dar be POE e tin AL fa) helps your body make more red blood cells. It is used to treat anemia caused by chronic kidney failure and chemotherapy. This medicine may be used for other purposes; ask your health care provider or pharmacist if you have questions. What should I tell my health care provider before I take this medicine? They need to know if you have any of these conditions: -blood clotting disorders or history of blood clots -cancer patient not on chemotherapy -cystic fibrosis -heart disease, such as angina, heart failure, or a history of a heart attack -hemoglobin level of 12 g/dL or greater -high blood pressure -low levels of folate, iron, or vitamin B12 -seizures -an unusual or allergic reaction to darbepoetin, erythropoietin, albumin, hamster proteins, latex, other medicines, foods, dyes, or preservatives -pregnant or trying to get pregnant -breast-feeding How should I use this medicine? This medicine is for injection into a vein or under the skin. It is usually given by a health care professional in a hospital or clinic setting. If you get this medicine at home, you will be taught how to prepare and give this medicine. Do not shake the solution before you withdraw a dose. Use exactly as directed. Take your medicine at regular intervals. Do not take your medicine more often than directed. It is important that you put your used needles and syringes in a special sharps container. Do not put them in a trash can. If you do not have a sharps container, call your pharmacist or healthcare provider to get one. Talk to your pediatrician regarding the use of this medicine in children. While this medicine may be used in children as young as 1 year for selected conditions, precautions do apply. Overdosage: If you think you have taken too much of this medicine contact a poison control center or emergency room at once. NOTE:  This medicine is only for you. Do not share this medicine with others. What if I miss a dose? If you miss a dose, take it as soon as you can. If it is almost time for your next dose, take only that dose. Do not take double or extra doses. What may interact with this medicine? Do not take this medicine with any of the following medications: -epoetin alfa This list may not describe all possible interactions. Give your health care provider a list of all the medicines, herbs, non-prescription drugs, or dietary supplements you use. Also tell them if you smoke, drink alcohol, or use illegal drugs. Some items may interact with your medicine. What should I watch for while using this medicine? Visit your prescriber or health care professional for regular checks on your progress and for the needed blood tests and blood pressure measurements. It is especially important for the doctor to make sure your hemoglobin level is in the desired range, to limit the risk of potential side effects and to give you the best benefit. Keep all appointments for any recommended tests. Check your blood pressure as directed. Ask your doctor what your blood pressure should be and when you should contact him or her. As your body makes more red blood cells, you may need to take iron, folic acid, or vitamin B supplements. Ask your doctor or health care provider which products are right for you. If you have kidney disease continue dietary restrictions, even though this medication can make you feel better. Talk with your doctor or health care professional about the   foods you eat and the vitamins that you take. What side effects may I notice from receiving this medicine? Side effects that you should report to your doctor or health care professional as soon as possible: -allergic reactions like skin rash, itching or hives, swelling of the face, lips, or tongue -breathing problems -changes in vision -chest pain -confusion, trouble speaking  or understanding -feeling faint or lightheaded, falls -high blood pressure -muscle aches or pains -pain, swelling, warmth in the leg -rapid weight gain -severe headaches -sudden numbness or weakness of the face, arm or leg -trouble walking, dizziness, loss of balance or coordination -seizures (convulsions) -swelling of the ankles, feet, hands -unusually weak or tired Side effects that usually do not require medical attention (report to your doctor or health care professional if they continue or are bothersome): -diarrhea -fever, chills (flu-like symptoms) -headaches -nausea, vomiting -redness, stinging, or swelling at site where injected This list may not describe all possible side effects. Call your doctor for medical advice about side effects. You may report side effects to FDA at 1-800-FDA-1088. Where should I keep my medicine? Keep out of the reach of children. Store in a refrigerator between 2 and 8 degrees C (36 and 46 degrees F). Do not freeze. Do not shake. Throw away any unused portion if using a single-dose vial. Throw away any unused medicine after the expiration date. NOTE: This sheet is a summary. It may not cover all possible information. If you have questions about this medicine, talk to your doctor, pharmacist, or health care provider.  2013, [employer]. (09/02/2008 10:23:57 AM)

## 2012-12-25 DIAGNOSIS — K921 Melena: Secondary | ICD-10-CM | POA: Diagnosis not present

## 2012-12-26 ENCOUNTER — Encounter: Payer: Self-pay | Admitting: Gastroenterology

## 2012-12-28 ENCOUNTER — Other Ambulatory Visit: Payer: Medicare Other | Admitting: Lab

## 2012-12-28 ENCOUNTER — Ambulatory Visit: Payer: Medicare Other

## 2012-12-28 ENCOUNTER — Other Ambulatory Visit (HOSPITAL_BASED_OUTPATIENT_CLINIC_OR_DEPARTMENT_OTHER): Payer: Medicare Other | Admitting: Lab

## 2012-12-28 DIAGNOSIS — D649 Anemia, unspecified: Secondary | ICD-10-CM

## 2012-12-28 LAB — CBC WITH DIFFERENTIAL/PLATELET
Basophils Absolute: 0 10*3/uL (ref 0.0–0.1)
Eosinophils Absolute: 0.1 10*3/uL (ref 0.0–0.5)
HGB: 11 g/dL — ABNORMAL LOW (ref 11.6–15.9)
MCV: 86.3 fL (ref 79.5–101.0)
MONO#: 0.4 10*3/uL (ref 0.1–0.9)
MONO%: 9.2 % (ref 0.0–14.0)
NEUT#: 1.7 10*3/uL (ref 1.5–6.5)
Platelets: 139 10*3/uL — ABNORMAL LOW (ref 145–400)
RDW: 16.8 % — ABNORMAL HIGH (ref 11.2–14.5)

## 2012-12-28 MED ORDER — DARBEPOETIN ALFA-POLYSORBATE 300 MCG/0.6ML IJ SOLN: 300.0000 ug | INTRAMUSCULAR | Status: DC

## 2013-01-16 ENCOUNTER — Ambulatory Visit: Payer: Medicare Other | Admitting: Gastroenterology

## 2013-01-18 ENCOUNTER — Ambulatory Visit (HOSPITAL_BASED_OUTPATIENT_CLINIC_OR_DEPARTMENT_OTHER): Payer: Medicare Other

## 2013-01-18 ENCOUNTER — Other Ambulatory Visit (HOSPITAL_BASED_OUTPATIENT_CLINIC_OR_DEPARTMENT_OTHER): Payer: Medicare Other | Admitting: Lab

## 2013-01-18 VITALS — BP 144/78 | HR 65 | Temp 98.1°F

## 2013-01-18 DIAGNOSIS — D649 Anemia, unspecified: Secondary | ICD-10-CM

## 2013-01-18 LAB — CBC WITH DIFFERENTIAL/PLATELET
Basophils Absolute: 0 10*3/uL (ref 0.0–0.1)
Eosinophils Absolute: 0.1 10*3/uL (ref 0.0–0.5)
HCT: 28.9 % — ABNORMAL LOW (ref 34.8–46.6)
HGB: 9.8 g/dL — ABNORMAL LOW (ref 11.6–15.9)
LYMPH%: 38.4 % (ref 14.0–49.7)
MONO#: 0.3 10*3/uL (ref 0.1–0.9)
NEUT#: 2.4 10*3/uL (ref 1.5–6.5)
NEUT%: 51.2 % (ref 38.4–76.8)
Platelets: 158 10*3/uL (ref 145–400)
WBC: 4.7 10*3/uL (ref 3.9–10.3)
lymph#: 1.8 10*3/uL (ref 0.9–3.3)

## 2013-01-18 MED ORDER — DARBEPOETIN ALFA-POLYSORBATE 300 MCG/0.6ML IJ SOLN
300.0000 ug | INTRAMUSCULAR | Status: DC
  Administered 2013-01-18: 300 ug via SUBCUTANEOUS
  Filled 2013-01-18: qty 0.6

## 2013-02-08 ENCOUNTER — Ambulatory Visit: Payer: Medicare Other

## 2013-02-08 ENCOUNTER — Other Ambulatory Visit (HOSPITAL_BASED_OUTPATIENT_CLINIC_OR_DEPARTMENT_OTHER): Payer: Medicare Other | Admitting: Lab

## 2013-02-08 ENCOUNTER — Encounter: Payer: Self-pay | Admitting: Oncology

## 2013-02-08 ENCOUNTER — Telehealth: Payer: Self-pay | Admitting: Oncology

## 2013-02-08 ENCOUNTER — Ambulatory Visit (HOSPITAL_BASED_OUTPATIENT_CLINIC_OR_DEPARTMENT_OTHER): Payer: Medicare Other | Admitting: Oncology

## 2013-02-08 VITALS — BP 140/69 | HR 64 | Temp 97.8°F | Resp 20 | Ht 62.5 in | Wt 173.8 lb

## 2013-02-08 DIAGNOSIS — D649 Anemia, unspecified: Secondary | ICD-10-CM

## 2013-02-08 DIAGNOSIS — N289 Disorder of kidney and ureter, unspecified: Secondary | ICD-10-CM

## 2013-02-08 LAB — CBC WITH DIFFERENTIAL/PLATELET
BASO%: 0.4 % (ref 0.0–2.0)
Basophils Absolute: 0 10*3/uL (ref 0.0–0.1)
EOS%: 3 % (ref 0.0–7.0)
HCT: 33.1 % — ABNORMAL LOW (ref 34.8–46.6)
HGB: 11.4 g/dL — ABNORMAL LOW (ref 11.6–15.9)
LYMPH%: 43.7 % (ref 14.0–49.7)
MCH: 30.5 pg (ref 25.1–34.0)
MCHC: 34.3 g/dL (ref 31.5–36.0)
NEUT%: 43 % (ref 38.4–76.8)
Platelets: 124 10*3/uL — ABNORMAL LOW (ref 145–400)

## 2013-02-08 MED ORDER — DARBEPOETIN ALFA-POLYSORBATE 300 MCG/0.6ML IJ SOLN: 300.0000 ug | INTRAMUSCULAR | Status: DC

## 2013-02-08 NOTE — Progress Notes (Signed)
Hematology and Oncology Follow Up Visit  Bamma Angelo Struckman [id number] 07-26-1941 72 y.o. 02/08/2013 9:28 AM [name],[name], MDMcKeown, [name], MD   Principle Diagnosis: This is a 72 year old woman with multifactorial anemia. 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  Secondary Diagnosis: Controlled hypertension.  Current therapy: She is on Aranesp 300 mcg every 3 weeks to keep her hemoglobin above 11.  Interim History:  Mrs. Gottwald presents today for a follow-up visit. She is a very pleasant 72 year old woman with history of hypertension, but, for the most part, is in reasonably good health and shape. She is tolerating Aranesp very well and it was able to really help with her overall quality of life and her performance status. She really functions well when her hemoglobin is above 11. The Aranesp has helped keeping doing that. She has not had any injection-related problems. Overall, her performance status and activity level remain at a reasonable range at this time.   Medications: I have reviewed the patient's current medications. Current outpatient prescriptions:cetirizine (ZYRTEC) 10 MG tablet, Take 10 mg by mouth daily., Disp: , Rfl: ;  aspirin 81 MG chewable tablet, Chew 81 mg by mouth every morning. , Disp: , Rfl: ;  Cholecalciferol (VITAMIN D-3) 5000 UNITS TABS, Take 10,000 Units by mouth daily., Disp: , Rfl: ;  Darbepoetin Alfa-Albumin (ARANESP IJ), Inject as directed., Disp: , Rfl:  diclofenac (VOLTAREN) 75 MG EC tablet, Take 75 mg by mouth 2 (two) times daily., Disp: , Rfl: ;  hydrochlorothiazide (HYDRODIURIL) 25 MG tablet, Take 25 mg by mouth daily., Disp: , Rfl: ;  HYDROcodone-acetaminophen (NORCO/VICODIN) 5-325 MG per tablet, Take 1 tablet by mouth every 6 (six) hours as needed. For headaches, Disp: , Rfl: ;  labetalol (NORMODYNE) 200 MG tablet, Take 300 mg by mouth 2 (two) times daily., Disp: , Rfl:  levothyroxine (SYNTHROID, LEVOTHROID) 100 MCG  tablet, Take 50-100 mcg by mouth daily. Take 50 mcg three times weekly on Monday,wednesdays and fridays. Take 100 mcg on all other days, Disp: , Rfl: ;  lisinopril (PRINIVIL,ZESTRIL) 20 MG tablet, Take 20 mg by mouth daily. , Disp: , Rfl: ;  omeprazole (PRILOSEC) 20 MG capsule, Take 20 mg by mouth every morning., Disp: , Rfl:  pravastatin (PRAVACHOL) 40 MG tablet, Take 40 mg by mouth at bedtime. , Disp: , Rfl:   Allergies:  Allergies  Allergen Reactions  . Codeine Anaphylaxis  . Penicillins Anaphylaxis  . Bactrim Itching and Swelling  . Clarithromycin Other (See Comments)    "Caused skin to peel off my hand."  . Flagyl (Metronidazole Hcl) Itching and Swelling  . Sulfa Antibiotics Itching and Swelling    Past Medical History, Surgical history, Social history, and Family History were reviewed and updated.  Review of Systems: Constitutional:  Negative for fever, chills, night sweats, anorexia, weight loss, pain. Cardiovascular: no chest pain or dyspnea on exertion Respiratory: no cough, shortness of breath, or wheezing Neurological: no TIA or stroke symptoms Dermatological: negative ENT: negative Skin: Negative. Gastrointestinal: no abdominal pain, change in bowel habits, or black or bloody stools Genito-Urinary: no dysuria, trouble voiding, or hematuria Hematological and Lymphatic: negative Breast: negative for breast lumps Musculoskeletal: negative Remaining ROS negative.  Physical Exam: Blood pressure 140/69, pulse 64, temperature 97.8 F (36.6 C), temperature source Oral, resp. rate 20, height 5' 2.5" (1.588 m), weight 173 lb 12.8 oz (78.835 kg). ECOG: 1 General appearance: alert, cooperative and no distress Head: Normocephalic, without obvious abnormality, atraumatic Neck: no adenopathy, no  carotid bruit, no JVD, supple, symmetrical, trachea midline and thyroid not enlarged, symmetric, no tenderness/mass/nodules Lymph nodes: Cervical, supraclavicular, and axillary nodes  normal. Heart:regular rate and rhythm, S1, S2 normal, no murmur, click, rub or gallop Lung:chest clear, no wheezing, rales, normal symmetric air entry Abdomen: soft, non-tender, without masses or organomegaly EXT:no erythema, induration, or nodules   Lab Results: Lab Results  Component Value Date   WBC 3.4* 02/08/2013   HGB 11.4* 02/08/2013   HCT 33.1* 02/08/2013   MCV 88.8 02/08/2013   PLT 124* 02/08/2013     Chemistry      Component Value Date/Time   NA 136 06/19/2012 2317   K 3.2* 06/19/2012 2317   CL 101 06/19/2012 2317   CO2 25 06/19/2012 2317   BUN 20 06/19/2012 2317   CREATININE 1.28* 06/19/2012 2317      Component Value Date/Time   CALCIUM 9.4 06/19/2012 2317   ALKPHOS 82 06/19/2012 2317   AST 17 06/19/2012 2317   ALT 8 06/19/2012 2317   BILITOT 0.4 06/19/2012 2317     Impression and Plan: This is a pleasant 72 year old female with the following issues:  1. Multifactorial anemia: She has an element of anemia of renal disease as well as anemia of possible myelodysplasia. Again, she has responded quite nicely to Aranesp with Hgb today that is normal to 11.6. The plan is to keep her on Aranesp 300 mcg every 3 weeks to keep her hemoglobin above 11. Patient's Hgb is 11.4 today and Aranesp will not be given. 2. Hypertension:On Lisinopril and Labetolol per PCP. 3. 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 4. Follow up. Every 3 weeks for CBC and injection. Visit in about 12 weeks.      [place], Erasmo Downer 5/9/20149:28 AM

## 2013-03-01 ENCOUNTER — Ambulatory Visit (HOSPITAL_BASED_OUTPATIENT_CLINIC_OR_DEPARTMENT_OTHER): Payer: Medicare Other

## 2013-03-01 ENCOUNTER — Other Ambulatory Visit (HOSPITAL_BASED_OUTPATIENT_CLINIC_OR_DEPARTMENT_OTHER): Payer: Medicare Other

## 2013-03-01 VITALS — BP 149/80 | HR 59 | Temp 98.3°F

## 2013-03-01 DIAGNOSIS — N289 Disorder of kidney and ureter, unspecified: Secondary | ICD-10-CM

## 2013-03-01 DIAGNOSIS — D649 Anemia, unspecified: Secondary | ICD-10-CM

## 2013-03-01 LAB — CBC WITH DIFFERENTIAL/PLATELET
BASO%: 0.3 % (ref 0.0–2.0)
Basophils Absolute: 0 10*3/uL (ref 0.0–0.1)
EOS%: 2.2 % (ref 0.0–7.0)
HCT: 27.9 % — ABNORMAL LOW (ref 34.8–46.6)
HGB: 9.6 g/dL — ABNORMAL LOW (ref 11.6–15.9)
MCH: 29.9 pg (ref 25.1–34.0)
MCHC: 34.6 g/dL (ref 31.5–36.0)
MCV: 86.5 fL (ref 79.5–101.0)
MONO%: 10.3 % (ref 0.0–14.0)
NEUT%: 47.9 % (ref 38.4–76.8)
RDW: 14.5 % (ref 11.2–14.5)
lymph#: 1.5 10*3/uL (ref 0.9–3.3)

## 2013-03-01 MED ORDER — DARBEPOETIN ALFA-POLYSORBATE 300 MCG/0.6ML IJ SOLN
300.0000 ug | INTRAMUSCULAR | Status: DC
  Administered 2013-03-01: 300 ug via SUBCUTANEOUS
  Filled 2013-03-01: qty 0.6

## 2013-03-22 ENCOUNTER — Other Ambulatory Visit (HOSPITAL_BASED_OUTPATIENT_CLINIC_OR_DEPARTMENT_OTHER): Payer: Medicare Other | Admitting: Lab

## 2013-03-22 ENCOUNTER — Ambulatory Visit: Payer: Medicare Other

## 2013-03-22 DIAGNOSIS — D649 Anemia, unspecified: Secondary | ICD-10-CM | POA: Diagnosis not present

## 2013-03-22 DIAGNOSIS — N289 Disorder of kidney and ureter, unspecified: Secondary | ICD-10-CM | POA: Diagnosis not present

## 2013-03-22 LAB — CBC WITH DIFFERENTIAL/PLATELET
BASO%: 0.6 % (ref 0.0–2.0)
Eosinophils Absolute: 0.1 10*3/uL (ref 0.0–0.5)
HCT: 32.6 % — ABNORMAL LOW (ref 34.8–46.6)
HGB: 11.3 g/dL — ABNORMAL LOW (ref 11.6–15.9)
MCHC: 34.8 g/dL (ref 31.5–36.0)
MONO#: 0.3 10*3/uL (ref 0.1–0.9)
NEUT#: 1.5 10*3/uL (ref 1.5–6.5)
NEUT%: 41.3 % (ref 38.4–76.8)
WBC: 3.7 10*3/uL — ABNORMAL LOW (ref 3.9–10.3)
lymph#: 1.7 10*3/uL (ref 0.9–3.3)

## 2013-03-22 MED ORDER — DARBEPOETIN ALFA-POLYSORBATE 300 MCG/0.6ML IJ SOLN: 300.0000 ug | INTRAMUSCULAR | Status: DC

## 2013-04-12 ENCOUNTER — Other Ambulatory Visit (HOSPITAL_BASED_OUTPATIENT_CLINIC_OR_DEPARTMENT_OTHER): Payer: Medicare Other | Admitting: Lab

## 2013-04-12 ENCOUNTER — Ambulatory Visit (HOSPITAL_BASED_OUTPATIENT_CLINIC_OR_DEPARTMENT_OTHER): Payer: Medicare Other

## 2013-04-12 VITALS — BP 147/83 | HR 63 | Temp 98.0°F

## 2013-04-12 DIAGNOSIS — D649 Anemia, unspecified: Secondary | ICD-10-CM

## 2013-04-12 DIAGNOSIS — N289 Disorder of kidney and ureter, unspecified: Secondary | ICD-10-CM | POA: Diagnosis not present

## 2013-04-12 LAB — CBC WITH DIFFERENTIAL/PLATELET
Basophils Absolute: 0 10*3/uL (ref 0.0–0.1)
HCT: 29 % — ABNORMAL LOW (ref 34.8–46.6)
HGB: 10.1 g/dL — ABNORMAL LOW (ref 11.6–15.9)
MCH: 30.3 pg (ref 25.1–34.0)
MONO#: 0.3 10*3/uL (ref 0.1–0.9)
NEUT%: 53.4 % (ref 38.4–76.8)
Platelets: 167 10*3/uL (ref 145–400)
lymph#: 1.6 10*3/uL (ref 0.9–3.3)

## 2013-04-12 MED ORDER — DARBEPOETIN ALFA-POLYSORBATE 300 MCG/0.6ML IJ SOLN
300.0000 ug | INTRAMUSCULAR | Status: DC
  Administered 2013-04-12: 300 ug via SUBCUTANEOUS
  Filled 2013-04-12: qty 0.6

## 2013-04-16 DIAGNOSIS — E559 Vitamin D deficiency, unspecified: Secondary | ICD-10-CM | POA: Diagnosis not present

## 2013-04-16 DIAGNOSIS — R7309 Other abnormal glucose: Secondary | ICD-10-CM | POA: Diagnosis not present

## 2013-04-16 DIAGNOSIS — Z79899 Other long term (current) drug therapy: Secondary | ICD-10-CM | POA: Diagnosis not present

## 2013-04-16 DIAGNOSIS — E782 Mixed hyperlipidemia: Secondary | ICD-10-CM | POA: Diagnosis not present

## 2013-04-16 DIAGNOSIS — I10 Essential (primary) hypertension: Secondary | ICD-10-CM | POA: Diagnosis not present

## 2013-05-03 ENCOUNTER — Ambulatory Visit: Payer: Medicare Other

## 2013-05-03 ENCOUNTER — Ambulatory Visit (HOSPITAL_BASED_OUTPATIENT_CLINIC_OR_DEPARTMENT_OTHER): Payer: Medicare Other | Admitting: Oncology

## 2013-05-03 ENCOUNTER — Other Ambulatory Visit (HOSPITAL_BASED_OUTPATIENT_CLINIC_OR_DEPARTMENT_OTHER): Payer: Medicare Other | Admitting: Lab

## 2013-05-03 ENCOUNTER — Telehealth: Payer: Self-pay | Admitting: Oncology

## 2013-05-03 VITALS — BP 152/79 | HR 65 | Temp 98.3°F | Resp 20 | Ht 62.5 in | Wt 176.5 lb

## 2013-05-03 DIAGNOSIS — I10 Essential (primary) hypertension: Secondary | ICD-10-CM | POA: Diagnosis not present

## 2013-05-03 DIAGNOSIS — D649 Anemia, unspecified: Secondary | ICD-10-CM

## 2013-05-03 DIAGNOSIS — N289 Disorder of kidney and ureter, unspecified: Secondary | ICD-10-CM

## 2013-05-03 LAB — CBC WITH DIFFERENTIAL/PLATELET
Basophils Absolute: 0 10*3/uL (ref 0.0–0.1)
Eosinophils Absolute: 0 10*3/uL (ref 0.0–0.5)
HGB: 10.3 g/dL — ABNORMAL LOW (ref 11.6–15.9)
NEUT#: 3.2 10*3/uL (ref 1.5–6.5)
RDW: 15.8 % — ABNORMAL HIGH (ref 11.2–14.5)
lymph#: 2.1 10*3/uL (ref 0.9–3.3)

## 2013-05-03 MED ORDER — DARBEPOETIN ALFA-POLYSORBATE 300 MCG/0.6ML IJ SOLN
300.0000 ug | INTRAMUSCULAR | Status: DC
  Administered 2013-05-03: 300 ug via SUBCUTANEOUS
  Filled 2013-05-03: qty 0.6

## 2013-05-03 NOTE — Progress Notes (Signed)
Hematology and Oncology Follow Up Visit  Susan [name] [id number] 1941-02-17 72 y.o. 05/03/2013 10:33 AM Susan [name], MDMcKeown, Susan Saxon, MD   Principle Diagnosis: This is a 71 year old woman with multifactorial anemia. 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  Secondary Diagnosis: Controlled hypertension.  Current therapy: She is on Aranesp 300 mcg every 3 weeks to keep her hemoglobin above 11.  Interim History:  Susan [name] presents today for a follow-up visit. She is a very pleasant 72 year old woman with history of hypertension, but, for the most part, is in reasonably good health and shape. She is tolerating Aranesp very well and it was able to really help with her overall quality of life and her performance status. She really functions well when her hemoglobin is above 11. The Aranesp has helped keeping doing that. She has not had any injection-related problems. Overall, her performance status and activity level remain at a reasonable range at this time. She reported episodes of dizziness that was attributed to her BP medication that was stopped. She is feeling better since stopping her diuretic.   Medications: I have reviewed the patient's current medications.  Current Outpatient Prescriptions  Medication Sig Dispense Refill  . aspirin 81 MG chewable tablet Chew 81 mg by mouth every morning.       . cetirizine (ZYRTEC) 10 MG tablet Take 10 mg by mouth daily.      . Cholecalciferol (VITAMIN D-3) 5000 UNITS TABS Take 10,000 Units by mouth daily.      . Darbepoetin Alfa-Albumin (ARANESP IJ) Inject as directed.      . diclofenac (VOLTAREN) 75 MG EC tablet Take 75 mg by mouth 2 (two) times daily.      [name] HYDROcodone-acetaminophen (NORCO/VICODIN) 5-325 MG per tablet Take 1 tablet by mouth every 6 (six) hours as needed. For headaches      . labetalol (NORMODYNE) 200 MG tablet Take 300 mg by mouth 2 (two) times daily.      [name] levothyroxine (SYNTHROID,  LEVOTHROID) 100 MCG tablet Take 50-100 mcg by mouth daily. Take 50 mcg three times weekly on Monday,wednesdays and fridays. Take 100 mcg on all other days      . lisinopril (PRINIVIL,ZESTRIL) 20 MG tablet Take 20 mg by mouth daily.       [name] omeprazole (PRILOSEC) 20 MG capsule Take 20 mg by mouth every morning.      . pravastatin (PRAVACHOL) 40 MG tablet Take 40 mg by mouth at bedtime.        Current Facility-Administered Medications  Medication Dose Route Frequency Provider Last Rate Last Dose  . darbepoetin (ARANESP) injection 300 mcg  300 mcg Subcutaneous Q21 days Maryanna Shape, NP   300 mcg at 05/03/13 1027    Allergies:  Allergies  Allergen Reactions  . Codeine Anaphylaxis  . Penicillins Anaphylaxis  . Bactrim Itching and Swelling  . Clarithromycin Other (See Comments)    "Caused skin to peel off my hand."  . Flagyl (Metronidazole Hcl) Itching and Swelling  . Sulfa Antibiotics Itching and Swelling    Past Medical History, Surgical history, Social history, and Family History were reviewed and updated.  Review of Systems: Constitutional:  Negative for fever, chills, night sweats, anorexia, weight loss, pain. Cardiovascular: no chest pain or dyspnea on exertion Respiratory: no cough, shortness of breath, or wheezing Neurological: no TIA or stroke symptoms Dermatological: negative ENT: negative Skin: Negative. Gastrointestinal: no abdominal pain, change in bowel habits, or black or bloody stools Genito-Urinary:  no dysuria, trouble voiding, or hematuria Hematological and Lymphatic: negative Breast: negative for breast lumps Musculoskeletal: negative Remaining ROS negative.  Physical Exam: Blood pressure 152/79, pulse 65, temperature 98.3 F (36.8 C), temperature source Oral, resp. rate 20, height 5' 2.5" (1.588 m), weight 176 lb 8 oz (80.06 kg). ECOG: 1 General appearance: alert, cooperative and no distress Head: Normocephalic, without obvious abnormality,  atraumatic Neck: no adenopathy, no carotid bruit, no JVD, supple, symmetrical, trachea midline and thyroid not enlarged, symmetric, no tenderness/mass/nodules Lymph nodes: Cervical, supraclavicular, and axillary nodes normal. Heart:regular rate and rhythm, S1, S2 normal, no murmur, click, rub or gallop Lung:chest clear, no wheezing, rales, normal symmetric air entry Abdomen: soft, non-tender, without masses or organomegaly EXT:no erythema, induration, or nodules   Lab Results: Lab Results  Component Value Date   WBC 5.8 05/03/2013   HGB 10.3* 05/03/2013   HCT 30.8* 05/03/2013   MCV 87.3 05/03/2013   PLT 131* 05/03/2013     Chemistry      Component Value Date/Time   NA 136 06/19/2012 2317   K 3.2* 06/19/2012 2317   CL 101 06/19/2012 2317   CO2 25 06/19/2012 2317   BUN 20 06/19/2012 2317   CREATININE 1.28* 06/19/2012 2317      Component Value Date/Time   CALCIUM 9.4 06/19/2012 2317   ALKPHOS 82 06/19/2012 2317   AST 17 06/19/2012 2317   ALT 8 06/19/2012 2317   BILITOT 0.4 06/19/2012 2317     Impression and Plan: This is a pleasant 72 year old female with the following issues:  1. Multifactorial anemia: She has an element of anemia of renal disease as well as anemia of possible myelodysplasia. Again, she has responded quite nicely to Aranesp with Hgb today is 10.3. The plan is to keep her on Aranesp 300 mcg every 3 weeks to keep her hemoglobin above 11.  2. Hypertension:On Lisinopril and Labetolol per PCP. 3. 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 4. Follow up. Every 3 weeks for CBC and injection. Visit in 09/2013      [facility] 8/1/201410:33 AM

## 2013-05-03 NOTE — Telephone Encounter (Signed)
gave pt appt for lab, injections and MD until December 2014

## 2013-05-24 ENCOUNTER — Other Ambulatory Visit (HOSPITAL_BASED_OUTPATIENT_CLINIC_OR_DEPARTMENT_OTHER): Payer: Medicare Other | Admitting: Lab

## 2013-05-24 ENCOUNTER — Ambulatory Visit: Payer: Medicare Other

## 2013-05-24 DIAGNOSIS — D649 Anemia, unspecified: Secondary | ICD-10-CM

## 2013-05-24 LAB — CBC WITH DIFFERENTIAL/PLATELET
Eosinophils Absolute: 0 10*3/uL (ref 0.0–0.5)
HCT: 34.2 % — ABNORMAL LOW (ref 34.8–46.6)
LYMPH%: 40.3 % (ref 14.0–49.7)
MONO#: 0.3 10*3/uL (ref 0.1–0.9)
NEUT#: 2.8 10*3/uL (ref 1.5–6.5)
NEUT%: 53.1 % (ref 38.4–76.8)
Platelets: 132 10*3/uL — ABNORMAL LOW (ref 145–400)
RBC: 3.84 10*6/uL (ref 3.70–5.45)
WBC: 5.2 10*3/uL (ref 3.9–10.3)

## 2013-05-24 NOTE — Progress Notes (Signed)
Aranesp held, Hgb 11.7

## 2013-06-07 ENCOUNTER — Encounter: Payer: Self-pay | Admitting: Internal Medicine

## 2013-06-14 ENCOUNTER — Ambulatory Visit (HOSPITAL_BASED_OUTPATIENT_CLINIC_OR_DEPARTMENT_OTHER): Payer: Medicare Other

## 2013-06-14 ENCOUNTER — Other Ambulatory Visit (HOSPITAL_BASED_OUTPATIENT_CLINIC_OR_DEPARTMENT_OTHER): Payer: Medicare Other | Admitting: Lab

## 2013-06-14 VITALS — BP 141/80 | HR 68 | Temp 97.8°F

## 2013-06-14 DIAGNOSIS — J019 Acute sinusitis, unspecified: Secondary | ICD-10-CM | POA: Diagnosis not present

## 2013-06-14 DIAGNOSIS — N289 Disorder of kidney and ureter, unspecified: Secondary | ICD-10-CM | POA: Diagnosis not present

## 2013-06-14 DIAGNOSIS — R350 Frequency of micturition: Secondary | ICD-10-CM | POA: Diagnosis not present

## 2013-06-14 DIAGNOSIS — E538 Deficiency of other specified B group vitamins: Secondary | ICD-10-CM | POA: Diagnosis not present

## 2013-06-14 DIAGNOSIS — D649 Anemia, unspecified: Secondary | ICD-10-CM

## 2013-06-14 DIAGNOSIS — I10 Essential (primary) hypertension: Secondary | ICD-10-CM | POA: Diagnosis not present

## 2013-06-14 DIAGNOSIS — E039 Hypothyroidism, unspecified: Secondary | ICD-10-CM | POA: Diagnosis not present

## 2013-06-14 LAB — CBC WITH DIFFERENTIAL/PLATELET
Basophils Absolute: 0 10*3/uL (ref 0.0–0.1)
Eosinophils Absolute: 0.1 10*3/uL (ref 0.0–0.5)
HGB: 10.2 g/dL — ABNORMAL LOW (ref 11.6–15.9)
LYMPH%: 50.9 % — ABNORMAL HIGH (ref 14.0–49.7)
MCV: 86.7 fL (ref 79.5–101.0)
MONO#: 0.4 10*3/uL (ref 0.1–0.9)
MONO%: 8.3 % (ref 0.0–14.0)
NEUT#: 1.7 10*3/uL (ref 1.5–6.5)
Platelets: 150 10*3/uL (ref 145–400)
RDW: 14.5 % (ref 11.2–14.5)
WBC: 4.3 10*3/uL (ref 3.9–10.3)

## 2013-06-14 MED ORDER — DARBEPOETIN ALFA-POLYSORBATE 300 MCG/0.6ML IJ SOLN
300.0000 ug | INTRAMUSCULAR | Status: DC
  Administered 2013-06-14: 300 ug via SUBCUTANEOUS
  Filled 2013-06-14: qty 0.6

## 2013-06-24 ENCOUNTER — Encounter: Payer: Self-pay | Admitting: Gastroenterology

## 2013-07-05 ENCOUNTER — Other Ambulatory Visit (HOSPITAL_BASED_OUTPATIENT_CLINIC_OR_DEPARTMENT_OTHER): Payer: Medicare Other | Admitting: Lab

## 2013-07-05 ENCOUNTER — Ambulatory Visit: Payer: Medicare Other

## 2013-07-05 DIAGNOSIS — D649 Anemia, unspecified: Secondary | ICD-10-CM

## 2013-07-05 LAB — CBC WITH DIFFERENTIAL/PLATELET
BASO%: 0.7 % (ref 0.0–2.0)
Basophils Absolute: 0 10*3/uL (ref 0.0–0.1)
HCT: 33.4 % — ABNORMAL LOW (ref 34.8–46.6)
HGB: 11.2 g/dL — ABNORMAL LOW (ref 11.6–15.9)
LYMPH%: 52.7 % — ABNORMAL HIGH (ref 14.0–49.7)
MCHC: 33.4 g/dL (ref 31.5–36.0)
MONO#: 0.3 10*3/uL (ref 0.1–0.9)
NEUT%: 36.1 % — ABNORMAL LOW (ref 38.4–76.8)
Platelets: 137 10*3/uL — ABNORMAL LOW (ref 145–400)
WBC: 4.4 10*3/uL (ref 3.9–10.3)
lymph#: 2.3 10*3/uL (ref 0.9–3.3)

## 2013-07-05 MED ORDER — DARBEPOETIN ALFA-POLYSORBATE 300 MCG/0.6ML IJ SOLN: 300.0000 ug | INTRAMUSCULAR | Status: DC

## 2013-07-15 ENCOUNTER — Encounter: Payer: Self-pay | Admitting: Gastroenterology

## 2013-07-15 ENCOUNTER — Ambulatory Visit (INDEPENDENT_AMBULATORY_CARE_PROVIDER_SITE_OTHER): Payer: Medicare Other | Admitting: Gastroenterology

## 2013-07-15 VITALS — BP 176/94 | HR 64 | Ht 62.25 in | Wt 179.5 lb

## 2013-07-15 DIAGNOSIS — K625 Hemorrhage of anus and rectum: Secondary | ICD-10-CM

## 2013-07-15 DIAGNOSIS — K648 Other hemorrhoids: Secondary | ICD-10-CM | POA: Diagnosis not present

## 2013-07-15 DIAGNOSIS — K649 Unspecified hemorrhoids: Secondary | ICD-10-CM | POA: Insufficient documentation

## 2013-07-15 MED ORDER — HYDROCORTISONE ACETATE 25 MG RE SUPP: 25.0000 mg | Freq: Two times a day (BID) | RECTAL | Status: DC

## 2013-07-15 NOTE — Assessment & Plan Note (Signed)
Patient with grade 3 symptomatic hemorrhoids.  Recommendations #1 Anusol HC suppositories #2 band ligation

## 2013-07-15 NOTE — Assessment & Plan Note (Signed)
Limited rectal bleeding is most likely secondary to hemorrhoids.  Colonoscopy in 2008, by report, was negative.  Recommendations #1 treatment for hemorrhoids

## 2013-07-15 NOTE — Progress Notes (Signed)
History of Present Illness: Pleasant 72 year old Afro-American female referred at the request of Unk Pinto, MD for evaluation of rectal bleeding.  For years, when she has a loose stool, she may see bright red blood on the  tissue and occasionally in the water.  She denies rectal or abdominal pain.  Last colonoscopy in 2008 apparently was normal.  She is hesitant to eat  outside the home because of diarrhea that will eventuate in rectal bleeding.     Past Medical History  Diagnosis Date  . Anemia   . Hypertension   . GERD (gastroesophageal reflux disease)   . Hypothyroidism   . Arthritis   . HLD (hyperlipidemia)   . Myelodysplasia    Past Surgical History  Procedure Laterality Date  . Umbilical hernia repair    . Rotator cuff repair Left   . Inguinal hernia repair    . Carpal tunnel release Right   . Dilation and curettage of uterus     family history includes Bladder Cancer in her sister; Coronary artery disease in her brother; Hypertension in her father and sister; Stroke in her father and sister. Current Outpatient Prescriptions  Medication Sig Dispense Refill  . aspirin 81 MG chewable tablet Chew 81 mg by mouth every morning.       . cetirizine (ZYRTEC) 10 MG tablet Take 10 mg by mouth daily.      . Cholecalciferol (VITAMIN D-3) 5000 UNITS TABS Take 10,000 Units by mouth daily.      . Darbepoetin Alfa-Albumin (ARANESP IJ) Inject as directed.      . diclofenac (VOLTAREN) 75 MG EC tablet Take 75 mg by mouth 2 (two) times daily.      [name] HYDROcodone-acetaminophen (NORCO/VICODIN) 5-325 MG per tablet Take 1 tablet by mouth every 6 (six) hours as needed. For headaches      . labetalol (NORMODYNE) 200 MG tablet Take 300 mg by mouth 2 (two) times daily.      [name] levothyroxine (SYNTHROID, LEVOTHROID) 100 MCG tablet Take 50-100 mcg by mouth daily. Take 50 mcg three times weekly on Monday,wednesdays and fridays. Take 100 mcg on all other days      . lisinopril (PRINIVIL,ZESTRIL) 20 MG  tablet Take 20 mg by mouth daily.       [name] omeprazole (PRILOSEC) 20 MG capsule Take 20 mg by mouth every morning.      . pravastatin (PRAVACHOL) 40 MG tablet Take 40 mg by mouth at bedtime.        No current facility-administered medications for this visit.   Allergies as of 07/15/2013 - Review Complete 07/15/2013  Allergen Reaction Noted  . Codeine Anaphylaxis 12/28/2011  . Penicillins Anaphylaxis 12/28/2011  . Bactrim Itching and Swelling 12/28/2011  . Clarithromycin Other (See Comments) 12/28/2011  . Flagyl [metronidazole hcl] Itching and Swelling 12/28/2011  . Sulfa antibiotics Itching and Swelling 12/28/2011    reports that she quit smoking about 16 years ago. Her smoking use included Cigarettes. She smoked 0.00 packs per day. She has never used smokeless tobacco. She reports that she drinks alcohol. She reports that she does not use illicit drugs.     Review of Systems: She complains of sinus pain and stuffiness Pertinent positive and negative review of systems were noted in the above HPI section. All other review of systems were otherwise negative.  Vital signs were reviewed in today's medical record Physical Exam: General: Well developed , well nourished, no acute distress Skin: anicteric Head: Normocephalic and atraumatic Eyes:  sclerae  anicteric, EOMI Ears: Normal auditory acuity Mouth: No deformity or lesions Neck: Supple, no masses or thyromegaly Lungs: Clear throughout to auscultation Heart: Regular rate and rhythm; no murmurs, rubs or bruits Abdomen: Soft, non tender and non distended. No masses, hepatosplenomegaly or hernias noted. Normal Bowel sounds Rectal: Grade 3 hemorrhoids Musculoskeletal: Symmetrical with no gross deformities  Skin: No lesions on visible extremities Pulses:  Normal pulses noted Extremities: No clubbing, cyanosis, edema or deformities noted Neurological: Alert oriented x 4, grossly nonfocal Cervical Nodes:  No significant cervical  adenopathy Inguinal Nodes: No significant inguinal adenopathy Psychological:  Alert and cooperative. Normal mood and affect

## 2013-07-15 NOTE — Patient Instructions (Signed)
Your Hemorrhoidal banding is scheduled on 08/21/2013 at 3:15am in the office No Prep needed

## 2013-07-18 ENCOUNTER — Other Ambulatory Visit (HOSPITAL_COMMUNITY): Payer: Self-pay | Admitting: Emergency Medicine

## 2013-07-18 ENCOUNTER — Ambulatory Visit (HOSPITAL_COMMUNITY)
Admission: RE | Admit: 2013-07-18 | Discharge: 2013-07-18 | Disposition: A | Discharge status: Home / Self Care | Payer: Medicare Other | Source: Ambulatory Visit | Attending: Emergency Medicine | Admitting: Emergency Medicine

## 2013-07-18 DIAGNOSIS — M898X9 Other specified disorders of bone, unspecified site: Secondary | ICD-10-CM | POA: Diagnosis not present

## 2013-07-18 DIAGNOSIS — J019 Acute sinusitis, unspecified: Secondary | ICD-10-CM | POA: Diagnosis not present

## 2013-07-18 DIAGNOSIS — D649 Anemia, unspecified: Secondary | ICD-10-CM | POA: Diagnosis not present

## 2013-07-18 DIAGNOSIS — M25511 Pain in right shoulder: Secondary | ICD-10-CM

## 2013-07-18 DIAGNOSIS — Z79899 Other long term (current) drug therapy: Secondary | ICD-10-CM | POA: Diagnosis not present

## 2013-07-18 DIAGNOSIS — M25519 Pain in unspecified shoulder: Secondary | ICD-10-CM | POA: Insufficient documentation

## 2013-07-18 DIAGNOSIS — I10 Essential (primary) hypertension: Secondary | ICD-10-CM | POA: Diagnosis not present

## 2013-07-18 DIAGNOSIS — E559 Vitamin D deficiency, unspecified: Secondary | ICD-10-CM | POA: Diagnosis not present

## 2013-07-18 DIAGNOSIS — E782 Mixed hyperlipidemia: Secondary | ICD-10-CM | POA: Diagnosis not present

## 2013-07-23 ENCOUNTER — Other Ambulatory Visit: Payer: Self-pay | Admitting: *Deleted

## 2013-07-23 ENCOUNTER — Telehealth: Payer: Self-pay | Admitting: *Deleted

## 2013-07-23 NOTE — Telephone Encounter (Signed)
Patient calling to say she needs to re-schedule her aranesp appt from 07-26-13 to 07-25-13. States she is going out of town for the weekend. pof to schedulers

## 2013-07-24 DIAGNOSIS — M67919 Unspecified disorder of synovium and tendon, unspecified shoulder: Secondary | ICD-10-CM | POA: Diagnosis not present

## 2013-07-25 ENCOUNTER — Other Ambulatory Visit (HOSPITAL_BASED_OUTPATIENT_CLINIC_OR_DEPARTMENT_OTHER): Payer: Medicare Other | Admitting: Lab

## 2013-07-25 ENCOUNTER — Ambulatory Visit (HOSPITAL_BASED_OUTPATIENT_CLINIC_OR_DEPARTMENT_OTHER): Payer: Medicare Other

## 2013-07-25 VITALS — BP 149/62 | HR 63 | Temp 98.5°F

## 2013-07-25 DIAGNOSIS — D649 Anemia, unspecified: Secondary | ICD-10-CM

## 2013-07-25 DIAGNOSIS — N289 Disorder of kidney and ureter, unspecified: Secondary | ICD-10-CM

## 2013-07-25 LAB — CBC WITH DIFFERENTIAL/PLATELET
BASO%: 0.4 % (ref 0.0–2.0)
Eosinophils Absolute: 0.1 10*3/uL (ref 0.0–0.5)
HGB: 9.2 g/dL — ABNORMAL LOW (ref 11.6–15.9)
LYMPH%: 47.3 % (ref 14.0–49.7)
MCV: 85.6 fL (ref 79.5–101.0)
MONO%: 7 % (ref 0.0–14.0)
NEUT#: 1.7 10*3/uL (ref 1.5–6.5)
NEUT%: 42.5 % (ref 38.4–76.8)
Platelets: 146 10*3/uL (ref 145–400)
RBC: 3.18 10*6/uL — ABNORMAL LOW (ref 3.70–5.45)
RDW: 14.6 % — ABNORMAL HIGH (ref 11.2–14.5)
WBC: 4.1 10*3/uL (ref 3.9–10.3)

## 2013-07-25 MED ORDER — DARBEPOETIN ALFA-POLYSORBATE 300 MCG/0.6ML IJ SOLN
300.0000 ug | INTRAMUSCULAR | Status: DC
  Administered 2013-07-25: 300 ug via SUBCUTANEOUS
  Filled 2013-07-25: qty 0.6

## 2013-07-26 ENCOUNTER — Other Ambulatory Visit: Payer: Medicare Other | Admitting: Lab

## 2013-07-26 ENCOUNTER — Ambulatory Visit: Payer: Medicare Other

## 2013-08-16 ENCOUNTER — Other Ambulatory Visit (HOSPITAL_BASED_OUTPATIENT_CLINIC_OR_DEPARTMENT_OTHER): Payer: Medicare Other

## 2013-08-16 ENCOUNTER — Ambulatory Visit (HOSPITAL_BASED_OUTPATIENT_CLINIC_OR_DEPARTMENT_OTHER): Payer: Medicare Other

## 2013-08-16 VITALS — BP 149/83 | HR 59 | Temp 97.2°F | Resp 18

## 2013-08-16 DIAGNOSIS — N289 Disorder of kidney and ureter, unspecified: Secondary | ICD-10-CM | POA: Diagnosis not present

## 2013-08-16 DIAGNOSIS — D649 Anemia, unspecified: Secondary | ICD-10-CM | POA: Diagnosis not present

## 2013-08-16 LAB — CBC WITH DIFFERENTIAL/PLATELET
Eosinophils Absolute: 0.1 10*3/uL (ref 0.0–0.5)
LYMPH%: 50.9 % — ABNORMAL HIGH (ref 14.0–49.7)
MCH: 28.6 pg (ref 25.1–34.0)
MONO#: 0.2 10*3/uL (ref 0.1–0.9)
NEUT#: 1.3 10*3/uL — ABNORMAL LOW (ref 1.5–6.5)
NEUT%: 38.5 % (ref 38.4–76.8)
Platelets: 131 10*3/uL — ABNORMAL LOW (ref 145–400)
RBC: 3.67 10*6/uL — ABNORMAL LOW (ref 3.70–5.45)
RDW: 15.4 % — ABNORMAL HIGH (ref 11.2–14.5)
WBC: 3.3 10*3/uL — ABNORMAL LOW (ref 3.9–10.3)
lymph#: 1.7 10*3/uL (ref 0.9–3.3)

## 2013-08-16 MED ORDER — DARBEPOETIN ALFA-POLYSORBATE 300 MCG/0.6ML IJ SOLN
300.0000 ug | INTRAMUSCULAR | Status: DC
  Administered 2013-08-16: 300 ug via SUBCUTANEOUS
  Filled 2013-08-16: qty 0.6

## 2013-08-21 ENCOUNTER — Ambulatory Visit (INDEPENDENT_AMBULATORY_CARE_PROVIDER_SITE_OTHER): Payer: Medicare Other | Admitting: Gastroenterology

## 2013-08-21 VITALS — BP 130/70 | HR 70 | Ht 62.5 in | Wt 176.4 lb

## 2013-08-21 DIAGNOSIS — K648 Other hemorrhoids: Secondary | ICD-10-CM

## 2013-08-21 NOTE — Patient Instructions (Signed)
HEMORRHOID BANDING PROCEDURE    FOLLOW-UP CARE   1. The procedure you have had should have been relatively painless since the banding of the area involved does not have nerve endings and there is no pain sensation.  The rubber band cuts off the blood supply to the hemorrhoid and the band may fall off as soon as 48 hours after the banding (the band may occasionally be seen in the toilet bowl following a bowel movement). You may notice a temporary feeling of fullness in the rectum which should respond adequately to plain Tylenol or Motrin.  2. Following the banding, avoid strenuous exercise that evening and resume full activity the next day.  A sitz bath (soaking in a warm tub) or bidet is soothing, and can be useful for cleansing the area after bowel movements.     3. To avoid constipation, take two tablespoons of natural wheat bran, natural oat bran, flax, Benefiber or any over the counter fiber supplement and increase your water intake to 7-8 glasses daily.    4. Unless you have been prescribed anorectal medication, do not put anything inside your rectum for two weeks: No suppositories, enemas, fingers, etc.  5. Occasionally, you may have more bleeding than usual after the banding procedure.  This is often from the untreated hemorrhoids rather than the treated one.  Don't be concerned if there is a tablespoon or so of blood.  If there is more blood than this, lie flat with your bottom higher than your head and apply an ice pack to the area. If the bleeding does not stop within a half an hour or if you feel faint, call our office at [phone] or go to the emergency room.  6. Problems are not common; however, if there is a substantial amount of bleeding, severe pain, chills, fever or difficulty passing urine (very rare) or other problems, you should call us at [phone] or report to the nearest emergency room.  7. Do not stay seated continuously for more than 2-3 hours for a day or two  after the procedure.  Tighten your buttock muscles 10-15 times every two hours and take 10-15 deep breaths every 1-2 hours.  Do not spend more than a few minutes on the toilet if you cannot empty your bowel; instead re-visit the toilet at a later time.    Your 2nd banding is scheduled on 10/09/2013 at 8:45am

## 2013-08-21 NOTE — Progress Notes (Signed)
PROCEDURE NOTE: The patient presents with symptomatic grade *2**  hemorrhoids, requesting rubber band ligation of his/her hemorrhoidal disease.  All risks, benefits and alternative forms of therapy were described and informed consent was obtained.  Endoscopy was performed and demonstrated 3 hemorrhoidal bundles, the largest in the right posterior area.  The second largest was a right anterior hemorrhoidal bundle The anorectum was pre-medicated with lubricant and nitroglycerine ointment The decision was made to band the *right posterior** internal hemorrhoid, and the [place] was used to perform band ligation without complication.  Digital anorectal examination was then performed to assure proper positioning of the band, and to adjust the banded tissue as required.  The patient was discharged home without pain or other issues.  Dietary and behavioral recommendations were given and along with follow-up instructions.    The patient will return in **2* for  follow-up and possible additional banding as required. No complications were encountered and the patient tolerated the procedure well.

## 2013-08-23 ENCOUNTER — Encounter: Payer: Self-pay | Admitting: Internal Medicine

## 2013-08-23 DIAGNOSIS — E039 Hypothyroidism, unspecified: Secondary | ICD-10-CM | POA: Insufficient documentation

## 2013-08-23 DIAGNOSIS — E559 Vitamin D deficiency, unspecified: Secondary | ICD-10-CM | POA: Insufficient documentation

## 2013-08-23 DIAGNOSIS — I10 Essential (primary) hypertension: Secondary | ICD-10-CM | POA: Insufficient documentation

## 2013-08-23 DIAGNOSIS — K219 Gastro-esophageal reflux disease without esophagitis: Secondary | ICD-10-CM | POA: Insufficient documentation

## 2013-08-27 ENCOUNTER — Ambulatory Visit: Payer: Medicare Other | Admitting: Internal Medicine

## 2013-08-27 ENCOUNTER — Encounter: Payer: Self-pay | Admitting: Internal Medicine

## 2013-08-27 VITALS — BP 144/86 | HR 68 | Temp 97.9°F | Resp 18 | Wt 179.0 lb

## 2013-08-27 DIAGNOSIS — I10 Essential (primary) hypertension: Secondary | ICD-10-CM | POA: Diagnosis not present

## 2013-08-27 DIAGNOSIS — D649 Anemia, unspecified: Secondary | ICD-10-CM

## 2013-08-27 DIAGNOSIS — E782 Mixed hyperlipidemia: Secondary | ICD-10-CM | POA: Insufficient documentation

## 2013-08-27 DIAGNOSIS — E559 Vitamin D deficiency, unspecified: Secondary | ICD-10-CM

## 2013-08-27 DIAGNOSIS — R7309 Other abnormal glucose: Secondary | ICD-10-CM

## 2013-08-27 DIAGNOSIS — M25519 Pain in unspecified shoulder: Secondary | ICD-10-CM | POA: Diagnosis not present

## 2013-08-27 DIAGNOSIS — J019 Acute sinusitis, unspecified: Secondary | ICD-10-CM | POA: Diagnosis not present

## 2013-08-27 DIAGNOSIS — Z79899 Other long term (current) drug therapy: Secondary | ICD-10-CM

## 2013-08-27 LAB — CBC WITH DIFFERENTIAL/PLATELET
Eosinophils Relative: 3 % (ref 0–5)
HCT: 34.1 % — ABNORMAL LOW (ref 36.0–46.0)
Hemoglobin: 11.3 g/dL — ABNORMAL LOW (ref 12.0–15.0)
Lymphocytes Relative: 51 % — ABNORMAL HIGH (ref 12–46)
Lymphs Abs: 2 10*3/uL (ref 0.7–4.0)
MCHC: 33.1 g/dL (ref 30.0–36.0)
MCV: 86.3 fL (ref 78.0–100.0)
Monocytes Absolute: 0.3 10*3/uL (ref 0.1–1.0)
Monocytes Relative: 9 % (ref 3–12)
Neutro Abs: 1.4 10*3/uL — ABNORMAL LOW (ref 1.7–7.7)
Neutrophils Relative %: 37 % — ABNORMAL LOW (ref 43–77)
Platelets: 242 10*3/uL (ref 150–400)
RBC: 3.95 MIL/uL (ref 3.87–5.11)
WBC: 3.9 10*3/uL — ABNORMAL LOW (ref 4.0–10.5)

## 2013-08-27 LAB — BASIC METABOLIC PANEL WITH GFR
Calcium: 9.7 mg/dL (ref 8.4–10.5)
Creat: 1.41 mg/dL — ABNORMAL HIGH (ref 0.50–1.10)
GFR, Est African American: 42 mL/min — ABNORMAL LOW
GFR, Est Non African American: 37 mL/min — ABNORMAL LOW
Sodium: 142 mEq/L (ref 135–145)

## 2013-08-27 LAB — RETICULOCYTES: ABS Retic: 122.5 10*3/uL (ref 19.0–186.0)

## 2013-08-27 LAB — IRON AND TIBC: Iron: 62 ug/dL (ref 42–145)

## 2013-08-27 MED ORDER — LISINOPRIL 40 MG PO TABS: 40.0000 mg | ORAL_TABLET | Freq: Every day | ORAL | Status: DC

## 2013-08-27 MED ORDER — LABETALOL HCL 300 MG PO TABS: 300.0000 mg | ORAL_TABLET | Freq: Two times a day (BID) | ORAL | Status: DC

## 2013-08-27 NOTE — Progress Notes (Signed)
Patient ID: Susan [name], female   DOB: [birth year], 72 y.o.   MRN: [id number]   This very nice 72 yo WBF  with hypertension, hyperlipidemia,MDS, pre-diabetes and vitamin D deficiency presents for 1 month follow up of slight drop in Hgb and she relates having hemorrhoidal banding about 5 days ago by Dr Deatra Ina. She also had BMET showing BUN/Creat 23/1.25 with low GFR of 49 in moderate insufficiency rage most likely related to HT Nephrosclerosis. .    BP has been controlled at home. Today's BP is 144/86. Patient denies any cardiac type chest pain, palpitations, dyspnea/orthopnea/PND, dizziness, claudication, or dependent edema.   Further, Patient has history of vitamin D deficiency with last vitamin D of   . Patient supplements vitamin without any suspected side-effects.  Medication Sig Dispense Refill  . aspirin 81 MG chewable tablet Chew 81 mg by mouth every morning.       . cetirizine (ZYRTEC) 10 MG tablet Take 10 mg by mouth daily.      . Cholecalciferol (VITAMIN D-3) 5000 UNITS TABS Take 10,000 Units by mouth daily.      . Darbepoetin Alfa-Albumin (ARANESP IJ) Inject as directed.      . diclofenac (VOLTAREN) 75 MG EC tablet Take 75 mg by mouth 2 (two) times daily.      [name] HYDROcodone-acetaminophen (NORCO/VICODIN) 5-325 MG per tablet Take 1 tablet by mouth every 6 (six) hours as needed. For headaches      . labetalol (NORMODYNE) 200 MG tablet Take 300 mg by mouth 2 (two) times daily.      [name] levothyroxine (SYNTHROID, LEVOTHROID) 100 MCG tablet Take 50-100 mcg by mouth daily. Take 50 mcg three times weekly on Monday,wednesdays and fridays. Take 100 mcg on all other days      . lisinopril (PRINIVIL,ZESTRIL) 20 MG tablet Take 20 mg by mouth daily.       [name] omeprazole (PRILOSEC) 20 MG capsule Take 20 mg by mouth every morning.      . pravastatin (PRAVACHOL) 40 MG tablet Take 40 mg by mouth at bedtime.        No current facility-administered medications on file prior to visit.     Allergies   Allergen Reactions  . Codeine Anaphylaxis  . Penicillins Anaphylaxis  . Bactrim Itching and Swelling  . Clarithromycin Other (See Comments)    "Caused skin to peel off my hand."  . Flagyl [Metronidazole Hcl] Itching and Swelling  . Sulfa Antibiotics Itching and Swelling    PMHx:   Past Medical History  Diagnosis Date  . Anemia   . Arthritis   . Myelodysplasia   . Hypertension   . HLD (hyperlipidemia)   . Hypothyroidism   . GERD (gastroesophageal reflux disease)   . Vitamin D deficiency     FHx:    Reviewed / unchanged  SHx:    Reviewed / unchanged  Systems Review: 12 point system review is totally negative    08/27/13 1114  BP: 144/86  Pulse: 68  Temp: 97.9 F (36.6 C)  Resp: 18     body mass index is 32.2 kg/(m^2)    Height      5' 2.5" (1.588 m).   Weight    179 lb (81.194 kg).  On Exam: Appears well nourished - in no distress. Eyes: PERRLA, EOMs, conjunctiva no swelling or erythema. Sinuses: No frontal/maxillary tenderness ENT/Mouth: EAC's clear, TM's nl w/o erythema, bulging. Nares clear w/o erythema, swelling, exudates. Oropharynx clear without erythema or exudates. Oral hygiene  is good. Tongue normal, non obstructing. Hearing intact.  Neck: Supple. Thyroid nl. Car 2+/2+ without bruits, nodes or JVD. Chest: Respirations nl with BS clear & equal w/o rales, rhonchi, wheezing or stridor.  Cor: Heart sounds normal w/ regular rate and rhythm without sig. murmurs, gallops, clicks, or rubs. Peripheral pulses normal and equal  without edema.  Abdomen: Soft & bowel sounds normal. Non-tender w/o guarding, rebound, hernias, masses, or organomegaly.  Lymphatics: Unremarkable.  Musculoskeletal: Full ROM all peripheral extremities, joint stability, 5/5 strength, and normal gait.  Skin: Warm, dry without exposed rashes, lesions, ecchymosis apparent.  Neuro: Cranial nerves intact, reflexes equal bilaterally. Sensory-motor testing grossly intact. Tendon reflexes grossly  intact.  Pysch: Alert & oriented x 3. Insight and judgement nl & appropriate. No ideations.  Assessment and Plan:  1. Hypertension - Continue monitor blood pressure at home. Recheck BMET/renal functions - may need to d/c NSAIDs in addition..  2. Hyperlipidemia - Continue diet/meds, exercise,& lifestyle modifications. Continue monitor periodic cholesterol/liver & renal functions   3. Pre-diabetes/Insulin Resistance - Continue diet, exercise, lifestyle modifications. Monitor appropriate labs.  4. Vitamin D Deficiency - Continue supplementation.  5. Hypothyroidism  Further disposition pending results of labs.

## 2013-08-27 NOTE — Patient Instructions (Signed)
Continue diet & medications same as discussed.   Further disposition pending lab results.      Hypertension As your heart beats, it forces blood through your arteries. This force is your blood pressure. If the pressure is too high, it is called hypertension (HTN) or high blood pressure. HTN is dangerous because you may have it and not know it. High blood pressure may mean that your heart has to work harder to pump blood. Your arteries may be narrow or stiff. The extra work puts you at risk for heart disease, stroke, and other problems.  Blood pressure consists of two numbers, a higher number over a lower, 110/72, for example. It is stated as "110 over 72." The ideal is below 120 for the top number (systolic) and under 80 for the bottom (diastolic). Write down your blood pressure today. You should pay close attention to your blood pressure if you have certain conditions such as:  Heart failure.  Prior heart attack.  Diabetes  Chronic kidney disease.  Prior stroke.  Multiple risk factors for heart disease. To see if you have HTN, your blood pressure should be measured while you are seated with your arm held at the level of the heart. It should be measured at least twice. A one-time elevated blood pressure reading (especially in the Emergency Department) does not mean that you need treatment. There may be conditions in which the blood pressure is different between your right and left arms. It is important to see your caregiver soon for a recheck. Most people have essential hypertension which means that there is not a specific cause. This type of high blood pressure may be lowered by changing lifestyle factors such as:  Stress.  Smoking.  Lack of exercise.  Excessive weight.  Drug/tobacco/alcohol use.  Eating less salt. Most people do not have symptoms from high blood pressure until it has caused damage to the body. Effective treatment can often prevent, delay or reduce that  damage. TREATMENT  When a cause has been identified, treatment for high blood pressure is directed at the cause. There are a large number of medications to treat HTN. These fall into several categories, and your caregiver will help you select the medicines that are best for you. Medications may have side effects. You should review side effects with your caregiver. If your blood pressure stays high after you have made lifestyle changes or started on medicines,   Your medication(s) may need to be changed.  Other problems may need to be addressed.  Be certain you understand your prescriptions, and know how and when to take your medicine.  Be sure to follow up with your caregiver within the time frame advised (usually within two weeks) to have your blood pressure rechecked and to review your medications.  If you are taking more than one medicine to lower your blood pressure, make sure you know how and at what times they should be taken. Taking two medicines at the same time can result in blood pressure that is too low. SEEK IMMEDIATE MEDICAL CARE IF:  You develop a severe headache, blurred or changing vision, or confusion.  You have unusual weakness or numbness, or a faint feeling.  You have severe chest or abdominal pain, vomiting, or breathing problems. MAKE SURE YOU:   Understand these instructions.  Will watch your condition.  Will get help right away if you are not doing well or get worse. Document Released: 09/19/2005 Document Revised: 12/12/2011 Document Reviewed: 05/09/2008 [employer] Patient  Information 2014 [place], [place].  Iron Deficiency Anemia There are many types of anemia. Iron deficiency anemia is the most common. Iron deficiency anemia is a decrease in the number of red blood cells caused by too little iron. Without enough iron, your body does not produce enough hemoglobin. Hemoglobin is a substance in red blood cells that carries oxygen to the body's tissues. Iron  deficiency anemia may leave you tired and short of breath. CAUSES   Lack of iron in the diet.  This may be seen in infants and children, because there is little iron in milk.  This may be seen in adults who do not eat enough iron-rich foods.  This may be seen in pregnant or breastfeeding women who do not take iron supplements. There is a much higher need for iron intake at these times.  Poor absorption of iron, as seen with intestinal disorders.  Intestinal bleeding.  Heavy periods. SYMPTOMS  Mild anemia may not be noticeable. Symptoms may include:  Fatigue.  Headache.  Pale skin.  Weakness.  Shortness of breath.  Dizziness.  Cold hands and feet.  Fast or irregular heartbeat. DIAGNOSIS  Diagnosis requires a thorough evaluation and physical exam by your caregiver.  Blood tests are generally used to confirm iron deficiency anemia.  Additional tests may be done to find the underlying cause of your anemia. These may include:  Testing for blood in the stool (fecal occult blood test).  A procedure to see inside the colon and rectum (colonoscopy).  A procedure to see inside the esophagus and stomach (endoscopy). TREATMENT   Correcting the cause of the iron deficiency is the first step.  Medicines, such as oral contraceptives, can make heavy menstrual flows lighter.  Antibiotics and other medicines can be used to treat peptic ulcers.  Surgery may be needed to remove a bleeding polyp, tumor, or fibroid.  Often, iron supplements (ferrous sulfate) are taken.  For the best iron absorption, take these supplements with an empty stomach.  You may need to take the supplements with food if you cannot tolerate them on an empty stomach. Vitamin C improves the absorption of iron. Your caregiver may recommend taking your iron tablets with a glass of orange juice or vitamin C supplement.  Milk and antacids should not be taken at the same time as iron supplements. They may  interfere with the absorption of iron.  Iron supplements can cause constipation. A stool softener is often recommended.  Pregnant and breastfeeding women will need to take extra iron, because their normal diet usually will not provide the required amount.  Patients who cannot tolerate iron by mouth can take it through a vein (intravenously) or by an injection into the muscle. HOME CARE INSTRUCTIONS   Ask your dietitian for help with diet questions.  Take iron and vitamins as directed by your caregiver.  Eat a diet rich in iron. Eat liver, lean beef, whole-grain bread, eggs, dried fruit, and dark green leafy vegetables. SEEK IMMEDIATE MEDICAL CARE IF:   You have a fainting episode. Do not drive yourself. Call your local emergency services (911 in [place] if no other help is available.  You have chest pain, nausea, or vomiting.  You develop severe or increased shortness of breath with activities.  You develop weakness or increased thirst.  You have a rapid heartbeat.  You develop unexplained sweating or become lightheaded when getting up from a chair or bed. MAKE SURE YOU:   Understand these instructions.  Will watch your condition.  Will get help right away if you are not doing well or get worse. Document Released: 09/16/2000 Document Revised: 12/12/2011 Document Reviewed: 01/26/2010 [facility] Patient Information 2014 [place].

## 2013-09-06 ENCOUNTER — Ambulatory Visit: Payer: Medicare Other

## 2013-09-06 ENCOUNTER — Ambulatory Visit (HOSPITAL_BASED_OUTPATIENT_CLINIC_OR_DEPARTMENT_OTHER): Payer: Medicare Other | Admitting: Oncology

## 2013-09-06 ENCOUNTER — Other Ambulatory Visit (HOSPITAL_BASED_OUTPATIENT_CLINIC_OR_DEPARTMENT_OTHER): Payer: Medicare Other | Admitting: Lab

## 2013-09-06 ENCOUNTER — Telehealth: Payer: Self-pay | Admitting: Oncology

## 2013-09-06 VITALS — BP 153/72 | HR 64 | Temp 98.0°F | Resp 18 | Ht 62.0 in | Wt 178.8 lb

## 2013-09-06 DIAGNOSIS — D649 Anemia, unspecified: Secondary | ICD-10-CM

## 2013-09-06 DIAGNOSIS — N289 Disorder of kidney and ureter, unspecified: Secondary | ICD-10-CM | POA: Diagnosis not present

## 2013-09-06 DIAGNOSIS — I10 Essential (primary) hypertension: Secondary | ICD-10-CM | POA: Diagnosis not present

## 2013-09-06 LAB — CBC WITH DIFFERENTIAL/PLATELET
Basophils Absolute: 0 10*3/uL (ref 0.0–0.1)
EOS%: 3.2 % (ref 0.0–7.0)
HCT: 35.5 % (ref 34.8–46.6)
HGB: 11.9 g/dL (ref 11.6–15.9)
LYMPH%: 46.7 % (ref 14.0–49.7)
MCH: 29.1 pg (ref 25.1–34.0)
MCHC: 33.6 g/dL (ref 31.5–36.0)
MCV: 86.6 fL (ref 79.5–101.0)
MONO%: 6.6 % (ref 0.0–14.0)
NEUT#: 1.6 10*3/uL (ref 1.5–6.5)
NEUT%: 43.1 % (ref 38.4–76.8)
Platelets: 145 10*3/uL (ref 145–400)
RBC: 4.1 10*6/uL (ref 3.70–5.45)
RDW: 15.5 % — ABNORMAL HIGH (ref 11.2–14.5)

## 2013-09-06 MED ORDER — DARBEPOETIN ALFA-POLYSORBATE 300 MCG/0.6ML IJ SOLN: 300.0000 ug | INTRAMUSCULAR | Status: DC

## 2013-09-06 MED ORDER — GUAIFENESIN ER 600 MG PO TB12: 600.0000 mg | ORAL_TABLET | Freq: Two times a day (BID) | ORAL | Status: DC

## 2013-09-06 NOTE — Progress Notes (Signed)
Hematology and Oncology Follow Up Visit  Susan [name] [id number] 08/07/1941 72 y.o. 09/06/2013 1:20 PM [name],Susan [name], MDMcKeown, Susan Saxon, MD   Principle Diagnosis: This is a 72 year old woman with multifactorial anemia. 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  Secondary Diagnosis: Controlled hypertension.  Current therapy: She is on Aranesp 300 mcg every 3 weeks to keep her hemoglobin above 11.  Interim History:  Susan [name] presents today for a follow-up visit. She is a very pleasant 72 year old woman with history of hypertension, but, for the most part, is in reasonably good health and shape. She is tolerating Aranesp very well and it was able to really help with her overall quality of life and her performance status. She really functions well when her hemoglobin is above 11. The Aranesp has helped keeping doing that. She has not had any injection-related problems. Overall, her performance status and activity level remain at a reasonable range at this time. She reported episodes of dizziness that was attributed to her BP medication that was stopped. She has reported some bleeding hemorrhoids at times which have been corrected. She have reported recurrent sinus drainage and postnasal drip associated with that. He has not reported any fevers or chills or sweats. Has not reported any weight loss or constitutional symptoms.  Medications: I have reviewed the patient's current medications.  Current Outpatient Prescriptions  Medication Sig Dispense Refill  . aspirin 81 MG chewable tablet Chew 81 mg by mouth every morning.       . cetirizine (ZYRTEC) 10 MG tablet Take 10 mg by mouth daily.      . Cholecalciferol (VITAMIN D-3) 5000 UNITS TABS Take 10,000 Units by mouth daily.      . Darbepoetin Alfa-Albumin (ARANESP IJ) Inject as directed.      . diclofenac (VOLTAREN) 75 MG EC tablet Take 75 mg by mouth 2 (two) times daily.      [name] guaiFENesin (MUCINEX) 600  MG 12 hr tablet Take 1 tablet (600 mg total) by mouth 2 (two) times daily.  28 tablet  0  . hydrochlorothiazide (HYDRODIURIL) 25 MG tablet Take 25 mg by mouth daily.      [name] HYDROcodone-acetaminophen (NORCO/VICODIN) 5-325 MG per tablet Take 1 tablet by mouth every 6 (six) hours as needed. For headaches      . labetalol (NORMODYNE) 300 MG tablet Take 1 tablet (300 mg total) by mouth 2 (two) times daily.  180 tablet  99  . levothyroxine (SYNTHROID, LEVOTHROID) 100 MCG tablet Take 50-100 mcg by mouth daily. Take 50 mcg three times weekly on Monday,wednesdays and fridays. Take 100 mcg on all other days      . lisinopril (PRINIVIL,ZESTRIL) 40 MG tablet Take 1 tablet (40 mg total) by mouth daily.  90 tablet  99  . omeprazole (PRILOSEC) 20 MG capsule Take 20 mg by mouth every morning.      . pravastatin (PRAVACHOL) 40 MG tablet Take 40 mg by mouth at bedtime.        No current facility-administered medications for this visit.    Allergies:  Allergies  Allergen Reactions  . Codeine Anaphylaxis  . Penicillins Anaphylaxis  . Bactrim Itching and Swelling  . Clarithromycin Other (See Comments)    "Caused skin to peel off my hand."  . Flagyl [Metronidazole Hcl] Itching and Swelling  . Sulfa Antibiotics Itching and Swelling    Past Medical History, Surgical history, Social history, and Family History were reviewed and updated.  Review of Systems:  Remaining ROS negative.  Physical Exam: Blood pressure 153/72, pulse 64, temperature 98 F (36.7 C), temperature source Oral, resp. rate 18, height 5\' 2"  (1.575 m), weight 178 lb 12.8 oz (81.103 kg), SpO2 100.00%. ECOG: 1 General appearance: alert, cooperative and no distress Head: Normocephalic, without obvious abnormality, atraumatic Neck: no adenopathy, no carotid bruit, no JVD, supple, symmetrical, trachea midline and thyroid not enlarged, symmetric, no tenderness/mass/nodules Lymph nodes: Cervical, supraclavicular, and axillary nodes  normal. Heart:regular rate and rhythm, S1, S2 normal, no murmur, click, rub or gallop Lung:chest clear, no wheezing, rales, normal symmetric air entry Abdomen: soft, non-tender, without masses or organomegaly EXT:no erythema, induration, or nodules   Lab Results: Lab Results  Component Value Date   WBC 3.6* 09/06/2013   HGB 11.9 09/06/2013   HCT 35.5 09/06/2013   MCV 86.6 09/06/2013   PLT 145 09/06/2013     Chemistry      Component Value Date/Time   NA 142 08/27/2013 1154   K 4.4 08/27/2013 1154   CL 106 08/27/2013 1154   CO2 25 08/27/2013 1154   BUN 21 08/27/2013 1154   CREATININE 1.41* 08/27/2013 1154   CREATININE 1.28* 06/19/2012 2317      Component Value Date/Time   CALCIUM 9.7 08/27/2013 1154   ALKPHOS 82 06/19/2012 2317   AST 17 06/19/2012 2317   ALT 8 06/19/2012 2317   BILITOT 0.4 06/19/2012 2317     Impression and Plan: This is a pleasant 72 year old female with the following issues:  1. Multifactorial anemia: She has an element of anemia of renal disease as well as anemia of possible myelodysplasia. She has responded quite nicely to Aranesp with Hgb today is 11.9. The plan is to keep her on Aranesp 300 mcg every 3 weeks to keep her hemoglobin above 11.  2. Hypertension:On Lisinopril and Labetolol per PCP. 3. History of questionable myelodysplasia: I do not really see any evidence of that at this point in peripheral blood, but, certainly, if she develops progressive cytopenias or refractory anemia, then we will restage her with a bone marrow biopsy. 4. Follow up. Every 3 weeks for CBC and injection. Visit in 01/2014. 5. Status congestion and postnasal drip: I gave her prescription for Mucinex to help with her symptoms.      [id number] 12/5/20141:20 PM

## 2013-09-06 NOTE — Telephone Encounter (Signed)
gv adn printed appt sched and avs for pt for DEC thru April 2015

## 2013-09-27 ENCOUNTER — Other Ambulatory Visit (HOSPITAL_BASED_OUTPATIENT_CLINIC_OR_DEPARTMENT_OTHER): Payer: Medicare Other

## 2013-09-27 ENCOUNTER — Ambulatory Visit (HOSPITAL_BASED_OUTPATIENT_CLINIC_OR_DEPARTMENT_OTHER): Payer: Medicare Other

## 2013-09-27 VITALS — BP 141/72 | HR 61 | Temp 98.1°F

## 2013-09-27 DIAGNOSIS — D649 Anemia, unspecified: Secondary | ICD-10-CM | POA: Diagnosis not present

## 2013-09-27 DIAGNOSIS — N289 Disorder of kidney and ureter, unspecified: Secondary | ICD-10-CM | POA: Diagnosis not present

## 2013-09-27 LAB — CBC WITH DIFFERENTIAL/PLATELET
BASO%: 0.2 % (ref 0.0–2.0)
Basophils Absolute: 0 10*3/uL (ref 0.0–0.1)
EOS%: 1.6 % (ref 0.0–7.0)
HGB: 10.5 g/dL — ABNORMAL LOW (ref 11.6–15.9)
MCH: 28.2 pg (ref 25.1–34.0)
MCV: 83.6 fL (ref 79.5–101.0)
MONO%: 6.3 % (ref 0.0–14.0)
NEUT#: 2.7 10*3/uL (ref 1.5–6.5)
Platelets: 170 10*3/uL (ref 145–400)
RDW: 14.7 % — ABNORMAL HIGH (ref 11.2–14.5)

## 2013-09-27 MED ORDER — DARBEPOETIN ALFA-POLYSORBATE 300 MCG/0.6ML IJ SOLN
300.0000 ug | INTRAMUSCULAR | Status: DC
  Administered 2013-09-27: 300 ug via SUBCUTANEOUS
  Filled 2013-09-27: qty 0.6

## 2013-10-09 ENCOUNTER — Ambulatory Visit (INDEPENDENT_AMBULATORY_CARE_PROVIDER_SITE_OTHER): Payer: Medicare Other | Admitting: Gastroenterology

## 2013-10-09 ENCOUNTER — Encounter: Payer: Self-pay | Admitting: Gastroenterology

## 2013-10-09 VITALS — BP 148/96 | HR 64 | Ht 62.25 in | Wt 176.1 lb

## 2013-10-09 DIAGNOSIS — K648 Other hemorrhoids: Secondary | ICD-10-CM

## 2013-10-09 NOTE — Progress Notes (Signed)
PROCEDURE NOTE: The patient presents with symptomatic grade **3*  hemorrhoids, requesting rubber band ligation of his/her hemorrhoidal disease.  All risks, benefits and alternative forms of therapy were described and informed consent was obtained.   The anorectum was pre-medicated with lubricant and nitroglycerine ointment The decision was made to band the *right anterior** internal hemorrhoid, and the [place] was used to perform band ligation without complication.  Digital anorectal examination was then performed to assure proper positioning of the band, and to adjust the banded tissue as required.  The patient was discharged home without pain or other issues.  Dietary and behavioral recommendations were given and along with follow-up instructions.    The patient will return in *2** for  follow-up and possible additional banding as required. No complications were encountered and the patient tolerated the procedure well.

## 2013-10-09 NOTE — Patient Instructions (Signed)
Your 3rd banding is scheduled on 11/04/2013 at [facility]   1. The procedure you have had should have been relatively painless since the banding of the area involved does not have nerve endings and there is no pain sensation.  The rubber band cuts off the blood supply to the hemorrhoid and the band may fall off as soon as 48 hours after the banding (the band may occasionally be seen in the toilet bowl following a bowel movement). You may notice a temporary feeling of fullness in the rectum which should respond adequately to plain Tylenol or Motrin.  2. Following the banding, avoid strenuous exercise that evening and resume full activity the next day.  A sitz bath (soaking in a warm tub) or bidet is soothing, and can be useful for cleansing the area after bowel movements.     3. To avoid constipation, take two tablespoons of natural wheat bran, natural oat bran, flax, Benefiber or any over the counter fiber supplement and increase your water intake to 7-8 glasses daily.    4. Unless you have been prescribed anorectal medication, do not put anything inside your rectum for two weeks: No suppositories, enemas, fingers, etc.  5. Occasionally, you may have more bleeding than usual after the banding procedure.  This is often from the untreated hemorrhoids rather than the treated one.  Don't be concerned if there is a tablespoon or so of blood.  If there is more blood than this, lie flat with your bottom higher than your head and apply an ice pack to the area. If the bleeding does not stop within a half an hour or if you feel faint, call our office at [phone] or go to the emergency room.  6. Problems are not common; however, if there is a substantial amount of bleeding, severe pain, chills, fever or difficulty passing urine (very rare) or other problems, you should call us at [phone] or report to the nearest emergency room.  7. Do not stay seated  continuously for more than 2-3 hours for a day or two after the procedure.  Tighten your buttock muscles 10-15 times every two hours and take 10-15 deep breaths every 1-2 hours.  Do not spend more than a few minutes on the toilet if you cannot empty your bowel; instead re-visit the toilet at a later time.

## 2013-10-17 ENCOUNTER — Ambulatory Visit: Payer: Self-pay | Admitting: Physician Assistant

## 2013-10-18 ENCOUNTER — Ambulatory Visit: Payer: Medicare Other

## 2013-10-18 ENCOUNTER — Telehealth: Payer: Self-pay | Admitting: *Deleted

## 2013-10-18 ENCOUNTER — Other Ambulatory Visit: Payer: Medicare Other

## 2013-10-18 NOTE — Telephone Encounter (Signed)
Called patient about missed appointment.  She has no answer machine so unable to leave a message.

## 2013-10-24 ENCOUNTER — Ambulatory Visit: Payer: Self-pay | Admitting: Physician Assistant

## 2013-11-04 ENCOUNTER — Encounter: Payer: Medicare Other | Admitting: Gastroenterology

## 2013-11-08 ENCOUNTER — Other Ambulatory Visit (HOSPITAL_BASED_OUTPATIENT_CLINIC_OR_DEPARTMENT_OTHER): Payer: Medicare Other

## 2013-11-08 ENCOUNTER — Ambulatory Visit (HOSPITAL_BASED_OUTPATIENT_CLINIC_OR_DEPARTMENT_OTHER): Payer: Medicare Other

## 2013-11-08 VITALS — BP 143/80 | HR 66 | Temp 98.4°F

## 2013-11-08 DIAGNOSIS — D649 Anemia, unspecified: Secondary | ICD-10-CM | POA: Diagnosis not present

## 2013-11-08 DIAGNOSIS — N289 Disorder of kidney and ureter, unspecified: Secondary | ICD-10-CM | POA: Diagnosis not present

## 2013-11-08 LAB — CBC WITH DIFFERENTIAL/PLATELET
BASO%: 0.5 % (ref 0.0–2.0)
Basophils Absolute: 0 10*3/uL (ref 0.0–0.1)
EOS%: 2.2 % (ref 0.0–7.0)
Eosinophils Absolute: 0.1 10*3/uL (ref 0.0–0.5)
HEMATOCRIT: 30.2 % — AB (ref 34.8–46.6)
HEMOGLOBIN: 10.2 g/dL — AB (ref 11.6–15.9)
LYMPH%: 39 % (ref 14.0–49.7)
MCH: 28.9 pg (ref 25.1–34.0)
MCHC: 33.6 g/dL (ref 31.5–36.0)
MCV: 85.9 fL (ref 79.5–101.0)
MONO#: 0.4 10*3/uL (ref 0.1–0.9)
MONO%: 8.4 % (ref 0.0–14.0)
NEUT#: 2.3 10*3/uL (ref 1.5–6.5)
NEUT%: 49.9 % (ref 38.4–76.8)
PLATELETS: 149 10*3/uL (ref 145–400)
RBC: 3.52 10*6/uL — ABNORMAL LOW (ref 3.70–5.45)
RDW: 15 % — ABNORMAL HIGH (ref 11.2–14.5)
WBC: 4.7 10*3/uL (ref 3.9–10.3)
lymph#: 1.8 10*3/uL (ref 0.9–3.3)

## 2013-11-08 MED ORDER — DARBEPOETIN ALFA-POLYSORBATE 300 MCG/0.6ML IJ SOLN
300.0000 ug | INTRAMUSCULAR | Status: DC
  Administered 2013-11-08: 300 ug via SUBCUTANEOUS
  Filled 2013-11-08: qty 0.6

## 2013-11-25 ENCOUNTER — Encounter: Payer: Self-pay | Admitting: Emergency Medicine

## 2013-11-25 ENCOUNTER — Ambulatory Visit (INDEPENDENT_AMBULATORY_CARE_PROVIDER_SITE_OTHER): Payer: Medicare Other | Admitting: Emergency Medicine

## 2013-11-25 VITALS — BP 198/108 | HR 74 | Temp 98.0°F | Resp 18 | Ht 63.0 in | Wt 178.0 lb

## 2013-11-25 DIAGNOSIS — E039 Hypothyroidism, unspecified: Secondary | ICD-10-CM

## 2013-11-25 DIAGNOSIS — I10 Essential (primary) hypertension: Secondary | ICD-10-CM

## 2013-11-25 DIAGNOSIS — D649 Anemia, unspecified: Secondary | ICD-10-CM | POA: Diagnosis not present

## 2013-11-25 DIAGNOSIS — J329 Chronic sinusitis, unspecified: Secondary | ICD-10-CM

## 2013-11-25 DIAGNOSIS — R351 Nocturia: Secondary | ICD-10-CM

## 2013-11-25 DIAGNOSIS — Z1331 Encounter for screening for depression: Secondary | ICD-10-CM

## 2013-11-25 DIAGNOSIS — F411 Generalized anxiety disorder: Secondary | ICD-10-CM | POA: Diagnosis not present

## 2013-11-25 LAB — BASIC METABOLIC PANEL WITH GFR
BUN: 21 mg/dL (ref 6–23)
CO2: 28 mEq/L (ref 19–32)
CREATININE: 1.62 mg/dL — AB (ref 0.50–1.10)
Calcium: 9.6 mg/dL (ref 8.4–10.5)
Chloride: 107 mEq/L (ref 96–112)
GFR, EST AFRICAN AMERICAN: 36 mL/min — AB
GFR, EST NON AFRICAN AMERICAN: 31 mL/min — AB
Glucose, Bld: 82 mg/dL (ref 70–99)
POTASSIUM: 4.2 meq/L (ref 3.5–5.3)
Sodium: 141 mEq/L (ref 135–145)

## 2013-11-25 LAB — CBC WITH DIFFERENTIAL/PLATELET
BASOS PCT: 0 % (ref 0–1)
Basophils Absolute: 0 10*3/uL (ref 0.0–0.1)
Eosinophils Absolute: 0.1 10*3/uL (ref 0.0–0.7)
Eosinophils Relative: 3 % (ref 0–5)
HCT: 31 % — ABNORMAL LOW (ref 36.0–46.0)
Hemoglobin: 10.4 g/dL — ABNORMAL LOW (ref 12.0–15.0)
Lymphocytes Relative: 49 % — ABNORMAL HIGH (ref 12–46)
Lymphs Abs: 1.8 10*3/uL (ref 0.7–4.0)
MCH: 29.5 pg (ref 26.0–34.0)
MCHC: 33.5 g/dL (ref 30.0–36.0)
MCV: 88.1 fL (ref 78.0–100.0)
Monocytes Absolute: 0.4 10*3/uL (ref 0.1–1.0)
Monocytes Relative: 10 % (ref 3–12)
NEUTROS PCT: 38 % — AB (ref 43–77)
Neutro Abs: 1.4 10*3/uL — ABNORMAL LOW (ref 1.7–7.7)
Platelets: 134 10*3/uL — ABNORMAL LOW (ref 150–400)
RBC: 3.52 MIL/uL — ABNORMAL LOW (ref 3.87–5.11)
RDW: 16.8 % — ABNORMAL HIGH (ref 11.5–15.5)
WBC: 3.6 10*3/uL — ABNORMAL LOW (ref 4.0–10.5)

## 2013-11-25 LAB — TSH: TSH: 4.63 u[IU]/mL — ABNORMAL HIGH (ref 0.350–4.500)

## 2013-11-25 MED ORDER — AZITHROMYCIN 250 MG PO TABS: ORAL_TABLET | ORAL | Status: AC

## 2013-11-25 MED ORDER — OLMESARTAN-AMLODIPINE-HCTZ 20-5-12.5 MG PO TABS: ORAL_TABLET | ORAL | Status: DC

## 2013-11-25 NOTE — Patient Instructions (Addendum)
Hypertension Hypertension is another name for high blood pressure. High blood pressure may mean that your heart needs to work harder to pump blood. Blood pressure consists of two numbers, which includes a higher number over a lower number (example: 110/72). HOME CARE   Make lifestyle changes as told by your doctor. This may include weight loss and exercise.  Take your blood pressure medicine every day.  Limit how much salt you use.  Stop smoking if you smoke.  Do not use drugs.  Talk to your doctor if you are using decongestants or birth control pills. These medicines might make blood pressure higher.  Females should not drink more than 1 alcoholic drink per day. Males should not drink more than 2 alcoholic drinks per day.  See your doctor as told. GET HELP RIGHT AWAY IF:   You have a blood pressure reading with a top number of 180 or higher.  You get a very bad headache.  You get blurred or changing vision.  You feel confused.  You feel weak, numb, or faint.  You get chest or belly (abdominal) pain.  You throw up (vomit).  You cannot breathe very well. MAKE SURE YOU:   Understand these instructions.  Will watch your condition.  Will get help right away if you are not doing well or get worse. Document Released: 03/07/2008 Document Revised: 12/12/2011 Document Reviewed: 03/07/2008 [facility] Patient Information 2014 [place], [place]. Stroke Prevention Some health problems and behaviors may make it more likely for you to have a stroke. Below are ways to lessen your risk of having a stroke.   Be active for at least 30 minutes on most or all days.  Do not smoke. Try not to be around others who smoke.  Do not drink too much alcohol.  Do not have more than 2 drinks a day if you are a man.  Do not have more than 1 drink a day if you are a woman and are not pregnant.  Eat healthy foods, such as fruits and vegetables. If you were put on a specific diet, follow the diet  as told.  Keep your cholesterol levels under control through diet and medicines. Look for foods that are low in saturated fat, trans fat, cholesterol, and are high in fiber.  If you have diabetes, follow all diet plans and take your medicine as told.  If you have high blood pressure (hypertension), follow all diet plans and take your medicine as told.  Keep a healthy weight. Eat foods that are low in calories, salt, saturated fat, trans fat, and cholesterol.  Do not take drugs.  Avoid birth control pills, if this applies. Talk to your doctor about the risks of taking birth control pills.  Talk to your doctor if you have sleep problems (sleep apnea).  Take all medicine as told by your doctor.  You may be told to take aspirin or blood thinner medicine. Take this medicine as told by your doctor.  Understand your medicine instructions.  Make sure any other conditions you have are being taken care of. GET HELP RIGHT AWAY IF:  You suddenly lose feeling (you feel numb) or have weakness in your face, arm, or leg.  Your face or eyelid hangs down to one side.  You suddenly feel confused.  You have trouble talking (aphasia) or understanding what people are saying.  You suddenly have trouble seeing in one or both eyes.  You suddenly have trouble walking.  You are dizzy.  You lose  your balance or your movements are clumsy (uncoordinated).  You suddenly have a very bad headache and you do not know the cause.  You have new chest pain.  Your heart feels like it is fluttering or skipping a beat (irregular heartbeat). Do not wait to see if the symptoms above go away. Get help right away. Call your local emergency services (911 in [place] Do not drive yourself to the hospital. Document Released: 03/20/2012 Document Revised: 07/10/2013 Document Reviewed: 03/22/2013 [facility] Patient Information 2014 [place]. Sinusitis Allegra over the counter Sinusitis is redness, soreness, and  puffiness (inflammation) of the air pockets in the bones of your face (sinuses). The redness, soreness, and puffiness can cause air and mucus to get trapped in your sinuses. This can allow germs to grow and cause an infection.  HOME CARE   Drink enough fluids to keep your pee (urine) clear or pale yellow.  Use a humidifier in your home.  Run a hot shower to create steam in the bathroom. Sit in the bathroom with the door closed. Breathe in the steam 3 4 times a day.  Put a warm, moist washcloth on your face 3 4 times a day, or as told by your doctor.  Use salt water sprays (saline sprays) to wet the thick fluid in your nose. This can help the sinuses drain.  Only take medicine as told by your doctor. GET HELP RIGHT AWAY IF:   Your pain gets worse.  You have very bad headaches.  You are sick to your stomach (nauseous).  You throw up (vomit).  You are very sleepy (drowsy) all the time.  Your face is puffy (swollen).  Your vision changes.  You have a stiff neck.  You have trouble breathing. MAKE SURE YOU:   Understand these instructions.  Will watch your condition.  Will get help right away if you are not doing well or get worse. Document Released: 03/07/2008 Document Revised: 06/13/2012 Document Reviewed: 04/24/2012 [facility] Patient Information 2014 [place].

## 2013-11-25 NOTE — Progress Notes (Signed)
Subjective:    Patient ID: Susan [name], female    DOB: 1940/11/19, 73 y.o.   MRN: [id number]  HPI Comments: 73 yo female with concerns of elevated BP with increased stress with grand-daughter.  She notes BP has been good in the past. She has been off HCTZ for weeks. She received Aranesp 300 mcg every 3 weeks to keep her hemoglobin above 11, since 2008 injection was 2 weeks ago she is due this FRI. She has not been exercising since cold weather. She notes stress is contributing to BP elevation but wants to restart Exforge with good results with that and Maxzide in the past.   She has been having increased sinus congestion for a couple of weeks that has been clear. She denies color of production. She has had increased right teeth pain x 1 week. She denies OTC SInus/ Cold/ Allergy relief.   Hypertension   Current Outpatient Prescriptions on File Prior to Visit  Medication Sig Dispense Refill  . aspirin 81 MG chewable tablet Chew 81 mg by mouth every morning.       . cetirizine (ZYRTEC) 10 MG tablet Take 10 mg by mouth daily.      . Cholecalciferol (VITAMIN D-3) 5000 UNITS TABS Take 10,000 Units by mouth daily.      . Darbepoetin Alfa-Albumin (ARANESP IJ) Inject as directed.      . diclofenac (VOLTAREN) 75 MG EC tablet Take 75 mg by mouth 2 (two) times daily.      [name] guaiFENesin (MUCINEX) 600 MG 12 hr tablet Take 1 tablet (600 mg total) by mouth 2 (two) times daily.  28 tablet  0  . HYDROcodone-acetaminophen (NORCO/VICODIN) 5-325 MG per tablet Take 1 tablet by mouth every 6 (six) hours as needed. For headaches      . labetalol (NORMODYNE) 300 MG tablet Take 1 tablet (300 mg total) by mouth 2 (two) times daily.  180 tablet  99  . levothyroxine (SYNTHROID, LEVOTHROID) 100 MCG tablet Take 50-100 mcg by mouth daily. Take 50 mcg three times weekly on Monday,wednesdays and fridays. Take 100 mcg on all other days      . omeprazole (PRILOSEC) 20 MG capsule Take 20 mg by mouth every morning.      .  pravastatin (PRAVACHOL) 40 MG tablet Take 40 mg by mouth at bedtime.       . hydrochlorothiazide (HYDRODIURIL) 25 MG tablet Take 25 mg by mouth daily.       No current facility-administered medications on file prior to visit.   Allergies  Allergen Reactions  . Codeine Anaphylaxis  . Penicillins Anaphylaxis  . Bactrim Itching and Swelling  . Clarithromycin Other (See Comments)    "Caused skin to peel off my hand."  . Flagyl [Metronidazole Hcl] Itching and Swelling  . Sulfa Antibiotics Itching and Swelling   Past Medical History  Diagnosis Date  . Anemia   . Arthritis   . Myelodysplasia   . Hypertension   . HLD (hyperlipidemia)   . Hypothyroidism   . GERD (gastroesophageal reflux disease)   . Vitamin D deficiency        Review of Systems  HENT: Positive for congestion, postnasal drip and sinus pressure.   All other systems reviewed and are negative.   BP 198/108  Pulse 74  Temp(Src) 98 F (36.7 C) (Temporal)  Resp 18  Ht 5\' 3"  (1.6 m)  Wt 178 lb (80.74 kg)  BMI 31.54 kg/m2 RECHECK 185/89    Objective:  Physical Exam  Nursing note and vitals reviewed. Constitutional: She is oriented to person, place, and time. She appears well-developed and well-nourished. No distress.  HENT:  Head: Normocephalic and atraumatic.  Right Ear: External ear normal.  Left Ear: External ear normal.  Nose: Nose normal.  Mouth/Throat: Oropharynx is clear and moist.  Eyes: Conjunctivae and EOM are normal.  Neck: Normal range of motion. Neck supple. No JVD present. No thyromegaly present.  Cardiovascular: Normal rate, regular rhythm, normal heart sounds and intact distal pulses.   Pulmonary/Chest: Effort normal and breath sounds normal.  Abdominal: Soft. Bowel sounds are normal. She exhibits no distension and no mass. There is no tenderness. There is no rebound and no guarding.  Musculoskeletal: Normal range of motion. She exhibits no edema and no tenderness.  Lymphadenopathy:    She  has no cervical adenopathy.  Neurological: She is alert and oriented to person, place, and time. No cranial nerve deficit.  Skin: Skin is warm and dry. No rash noted. No erythema. No pallor.  Psychiatric: She has a normal mood and affect. Her behavior is normal. Judgment and thought content normal.    Depression screen NEG      Assessment & Plan:  1. HTN- Check labs and BP call if >130/80, increase cardio Stop Lisinopril and add Tribnezor 20/5/12.5 1 QD SX #49. May increase to 2 pills a day if BP>140/90 2. Anxiety/ Agitation/stress- Patient has plan in place to reduce stress once resolves family issues, Declines RX, w/c if SX increase or ER, recommend counseling if symptoms continue 3.Sinusitis/ Allergic rhinitis- Allegra OTC, increase H2o, allergy hygiene explained. Zpak AD 4. ? Urine frequency- Check labs, hygiene explained, increase H2o

## 2013-11-26 LAB — URINALYSIS, MICROSCOPIC ONLY: CRYSTALS: NONE SEEN

## 2013-11-26 LAB — URINALYSIS, ROUTINE W REFLEX MICROSCOPIC
Glucose, UA: NEGATIVE mg/dL
Hgb urine dipstick: NEGATIVE
Nitrite: NEGATIVE
Protein, ur: 100 mg/dL — AB
SPECIFIC GRAVITY, URINE: 1.021 (ref 1.005–1.030)
UROBILINOGEN UA: 1 mg/dL (ref 0.0–1.0)
pH: 5.5 (ref 5.0–8.0)

## 2013-11-26 LAB — URINE CULTURE
Colony Count: NO GROWTH
Organism ID, Bacteria: NO GROWTH

## 2013-11-27 ENCOUNTER — Other Ambulatory Visit: Payer: Self-pay | Admitting: Emergency Medicine

## 2013-11-27 MED ORDER — CIPROFLOXACIN HCL 250 MG PO TABS: 250.0000 mg | ORAL_TABLET | Freq: Two times a day (BID) | ORAL | Status: AC

## 2013-11-29 ENCOUNTER — Ambulatory Visit: Payer: Medicare Other

## 2013-11-29 ENCOUNTER — Other Ambulatory Visit (HOSPITAL_BASED_OUTPATIENT_CLINIC_OR_DEPARTMENT_OTHER): Payer: Medicare Other

## 2013-11-29 DIAGNOSIS — N289 Disorder of kidney and ureter, unspecified: Secondary | ICD-10-CM | POA: Diagnosis not present

## 2013-11-29 DIAGNOSIS — D649 Anemia, unspecified: Secondary | ICD-10-CM

## 2013-11-29 LAB — CBC WITH DIFFERENTIAL/PLATELET
BASO%: 0.8 % (ref 0.0–2.0)
Basophils Absolute: 0 10*3/uL (ref 0.0–0.1)
EOS%: 2.5 % (ref 0.0–7.0)
Eosinophils Absolute: 0.1 10*3/uL (ref 0.0–0.5)
HCT: 33.3 % — ABNORMAL LOW (ref 34.8–46.6)
HEMOGLOBIN: 11.1 g/dL — AB (ref 11.6–15.9)
LYMPH#: 1.4 10*3/uL (ref 0.9–3.3)
LYMPH%: 47.2 % (ref 14.0–49.7)
MCH: 29.5 pg (ref 25.1–34.0)
MCHC: 33.3 g/dL (ref 31.5–36.0)
MCV: 88.5 fL (ref 79.5–101.0)
MONO#: 0.3 10*3/uL (ref 0.1–0.9)
MONO%: 8.4 % (ref 0.0–14.0)
NEUT#: 1.2 10*3/uL — ABNORMAL LOW (ref 1.5–6.5)
NEUT%: 41.1 % (ref 38.4–76.8)
Platelets: 122 10*3/uL — ABNORMAL LOW (ref 145–400)
RBC: 3.76 10*6/uL (ref 3.70–5.45)
RDW: 16.4 % — AB (ref 11.2–14.5)
WBC: 3 10*3/uL — ABNORMAL LOW (ref 3.9–10.3)

## 2013-11-29 MED ORDER — DARBEPOETIN ALFA-POLYSORBATE 300 MCG/0.6ML IJ SOLN: 300.0000 ug | INTRAMUSCULAR | Status: DC

## 2013-12-06 ENCOUNTER — Encounter: Payer: Medicare Other | Admitting: Gastroenterology

## 2013-12-17 ENCOUNTER — Telehealth: Payer: Self-pay | Admitting: Medical Oncology

## 2013-12-17 NOTE — Telephone Encounter (Signed)
Patient called stating she is out of town, in [place], and will not be able to return in time for her appt this Friday. Stating she will be seeing Dr Kimber Relic in [place] for labs and aransep injection if needed. Call to Dr. Cay Schillings @ [phone] and office requesting patients last labs and most recent office note, verbal permission given by patient to fax to Dr Cay Schillings @ [phone] to the attention of Diona.   Information faxed as requested.  03/20 appts cancelled per patient request.

## 2013-12-18 ENCOUNTER — Encounter: Payer: Self-pay | Admitting: Internal Medicine

## 2013-12-18 DIAGNOSIS — D469 Myelodysplastic syndrome, unspecified: Secondary | ICD-10-CM | POA: Diagnosis not present

## 2013-12-20 ENCOUNTER — Other Ambulatory Visit: Payer: Medicare Other

## 2013-12-20 ENCOUNTER — Ambulatory Visit: Payer: Medicare Other

## 2013-12-25 ENCOUNTER — Other Ambulatory Visit: Payer: Self-pay | Admitting: *Deleted

## 2013-12-25 NOTE — Telephone Encounter (Signed)
Patient calling to say she missed her last lab and injection appt here, d/t being in [place]. Had labs done there and her hgb was 10.2, but they did not give her an injection. Per dr Alen Blew, okay to wait until next appt for lab, midlevel and injection appt on 01/10/14. Patient notified.

## 2014-01-01 ENCOUNTER — Other Ambulatory Visit: Payer: Self-pay | Admitting: Internal Medicine

## 2014-01-06 DIAGNOSIS — H35039 Hypertensive retinopathy, unspecified eye: Secondary | ICD-10-CM | POA: Diagnosis not present

## 2014-01-06 DIAGNOSIS — H25019 Cortical age-related cataract, unspecified eye: Secondary | ICD-10-CM | POA: Diagnosis not present

## 2014-01-06 DIAGNOSIS — H353190 Nonexudative age-related macular degeneration, unspecified eye, stage unspecified: Secondary | ICD-10-CM | POA: Diagnosis not present

## 2014-01-06 DIAGNOSIS — H2510 Age-related nuclear cataract, unspecified eye: Secondary | ICD-10-CM | POA: Diagnosis not present

## 2014-01-10 ENCOUNTER — Encounter: Payer: Self-pay | Admitting: Oncology

## 2014-01-10 ENCOUNTER — Other Ambulatory Visit (HOSPITAL_BASED_OUTPATIENT_CLINIC_OR_DEPARTMENT_OTHER): Payer: Medicare Other

## 2014-01-10 ENCOUNTER — Telehealth: Payer: Self-pay | Admitting: Oncology

## 2014-01-10 ENCOUNTER — Ambulatory Visit (HOSPITAL_BASED_OUTPATIENT_CLINIC_OR_DEPARTMENT_OTHER): Payer: Medicare Other

## 2014-01-10 ENCOUNTER — Ambulatory Visit (HOSPITAL_BASED_OUTPATIENT_CLINIC_OR_DEPARTMENT_OTHER): Payer: Medicare Other | Admitting: Oncology

## 2014-01-10 VITALS — BP 123/75 | HR 80 | Temp 97.8°F | Resp 19 | Ht 63.0 in | Wt 174.2 lb

## 2014-01-10 DIAGNOSIS — I10 Essential (primary) hypertension: Secondary | ICD-10-CM | POA: Diagnosis not present

## 2014-01-10 DIAGNOSIS — D649 Anemia, unspecified: Secondary | ICD-10-CM

## 2014-01-10 DIAGNOSIS — N289 Disorder of kidney and ureter, unspecified: Secondary | ICD-10-CM

## 2014-01-10 LAB — CBC WITH DIFFERENTIAL/PLATELET
BASO%: 0.5 % (ref 0.0–2.0)
Basophils Absolute: 0 10*3/uL (ref 0.0–0.1)
EOS ABS: 0.1 10*3/uL (ref 0.0–0.5)
EOS%: 3.4 % (ref 0.0–7.0)
HCT: 27.2 % — ABNORMAL LOW (ref 34.8–46.6)
HGB: 9.2 g/dL — ABNORMAL LOW (ref 11.6–15.9)
LYMPH%: 47.1 % (ref 14.0–49.7)
MCH: 29.4 pg (ref 25.1–34.0)
MCHC: 33.9 g/dL (ref 31.5–36.0)
MCV: 86.8 fL (ref 79.5–101.0)
MONO#: 0.3 10*3/uL (ref 0.1–0.9)
MONO%: 7.1 % (ref 0.0–14.0)
NEUT%: 41.9 % (ref 38.4–76.8)
NEUTROS ABS: 1.5 10*3/uL (ref 1.5–6.5)
PLATELETS: 137 10*3/uL — AB (ref 145–400)
RBC: 3.13 10*6/uL — ABNORMAL LOW (ref 3.70–5.45)
RDW: 14.5 % (ref 11.2–14.5)
WBC: 3.7 10*3/uL — ABNORMAL LOW (ref 3.9–10.3)
lymph#: 1.7 10*3/uL (ref 0.9–3.3)

## 2014-01-10 MED ORDER — DARBEPOETIN ALFA-POLYSORBATE 300 MCG/0.6ML IJ SOLN
300.0000 ug | INTRAMUSCULAR | Status: DC
  Administered 2014-01-10: 300 ug via SUBCUTANEOUS
  Filled 2014-01-10: qty 0.6

## 2014-01-10 NOTE — Telephone Encounter (Signed)
Gave pt appt for lab,injections every 3 weeks see MD on August 2015

## 2014-01-10 NOTE — Progress Notes (Signed)
Hematology and Oncology Follow Up Visit  Susan [name] Susan [name] [id number] 07/12/1941 73 y.o. 01/10/2014 9:45 AM Susan [name], MDMcKeown, Susan Saxon, MD   Principle Diagnosis: This is a 73 year old woman with multifactorial anemia. 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  Secondary Diagnosis: Controlled hypertension.  Current therapy: She is on Aranesp 300 mcg every 3 weeks to keep her hemoglobin above 11.  Interim History:  Susan [name] presents today for a follow-up visit. She is a very pleasant 73 year old woman with history of hypertension, but, for the most part, is in reasonably good health and [name]. She is tolerating Aranesp very well and it was able to really help with her overall quality of life and her performance status. She really functions well when her hemoglobin is above 11. The Aranesp has helped keeping doing that. She has not had any injection-related problems. Overall, her performance status and activity level remain at a reasonable range at this time. She has not had an Aranesp injection in about 9 weeks because she was out of town in [place]. She has more fatigue today. Denies bleeding. He has not reported any fevers or chills or sweats. Has not reported any weight loss or constitutional symptoms.  Medications: I have reviewed the patient's current medications.  Current Outpatient Prescriptions  Medication Sig Dispense Refill  . aspirin 81 MG chewable tablet Chew 81 mg by mouth every morning.       . cetirizine (ZYRTEC) 10 MG tablet Take 10 mg by mouth daily.      . Cholecalciferol (VITAMIN D-3) 5000 UNITS TABS Take 10,000 Units by mouth daily.      . Darbepoetin Alfa-Albumin (ARANESP IJ) Inject as directed.      . diclofenac (VOLTAREN) 75 MG EC tablet Take 75 mg by mouth 2 (two) times daily.      [name] HYDROcodone-acetaminophen (NORCO/VICODIN) 5-325 MG per tablet Take 1 tablet by mouth every 6 (six) hours as needed. For headaches      .  labetalol (NORMODYNE) 300 MG tablet Take 1 tablet (300 mg total) by mouth 2 (two) times daily.  180 tablet  99  . levothyroxine (SYNTHROID, LEVOTHROID) 100 MCG tablet Take 50-100 mcg by mouth daily. Take 50 mcg three times weekly on Monday,wednesdays and fridays. Take 100 mcg on all other days      . Olmesartan-Amlodipine-HCTZ (TRIBENZOR) 20-5-12.5 MG TABS 1 po QD  49 tablet  0  . omeprazole (PRILOSEC) 20 MG capsule Take 20 mg by mouth every morning.      . pravastatin (PRAVACHOL) 40 MG tablet Take 40 mg by mouth at bedtime.       [name] guaiFENesin (MUCINEX) 600 MG 12 hr tablet Take 1 tablet (600 mg total) by mouth 2 (two) times daily.  28 tablet  0  . hydrochlorothiazide (HYDRODIURIL) 25 MG tablet Take 25 mg by mouth daily.       No current facility-administered medications for this visit.    Allergies:  Allergies  Allergen Reactions  . Codeine Anaphylaxis  . Penicillins Anaphylaxis  . Bactrim Itching and Swelling  . Clarithromycin Other (See Comments)    "Caused skin to peel off my hand."  . Flagyl [Metronidazole Hcl] Itching and Swelling  . Sulfa Antibiotics Itching and Swelling    Past Medical History, Surgical history, Social history, and Family History were reviewed and updated.  Review of Systems:  Remaining ROS negative.  Physical Exam: Blood pressure 123/75, pulse 80, temperature 97.8 F (36.6 C), temperature source  Oral, resp. rate 19, height '5\' 3"'  (1.6 m), weight 174 lb 3.2 oz (79.017 kg). ECOG: 1 General appearance: alert, cooperative and no distress Head: Normocephalic, without obvious abnormality, atraumatic Neck: no adenopathy, no carotid bruit, no JVD, supple, symmetrical, trachea midline and thyroid not enlarged, symmetric, no tenderness/mass/nodules Lymph nodes: Cervical, supraclavicular, and axillary nodes normal. Heart:regular rate and rhythm, S1, S2 normal, no murmur, click, rub or gallop Lung:chest clear, no wheezing, rales, normal symmetric air entry Abdomen:  soft, non-tender, without masses or organomegaly EXT:no erythema, induration, or nodules   Lab Results: Lab Results  Component Value Date   WBC 3.7* 01/10/2014   HGB 9.2* 01/10/2014   HCT 27.2* 01/10/2014   MCV 86.8 01/10/2014   PLT 137* 01/10/2014     Chemistry      Component Value Date/Time   NA 141 11/25/2013 1455   K 4.2 11/25/2013 1455   CL 107 11/25/2013 1455   CO2 28 11/25/2013 1455   BUN 21 11/25/2013 1455   CREATININE 1.62* 11/25/2013 1455   CREATININE 1.28* 06/19/2012 2317      Component Value Date/Time   CALCIUM 9.6 11/25/2013 1455   ALKPHOS 82 06/19/2012 2317   AST 17 06/19/2012 2317   ALT 8 06/19/2012 2317   BILITOT 0.4 06/19/2012 2317     Impression and Plan: [name] with the following issues:  1. Multifactorial anemia: She has an element of anemia of renal disease as well as anemia of possible myelodysplasia. Hemoglobin is 9.2 today. She will receive her Aranesp.The plan is to keep her on Aranesp 300 mcg every 3 weeks to keep her hemoglobin above 11.  2. Hypertension:On Lisinopril and Labetolol per PCP. 3. History of questionable myelodysplasia: I do not really see any evidence of that at this point in peripheral blood, but, certainly, if she develops progressive cytopenias or refractory anemia, then we will restage her with a bone marrow biopsy. 4. Follow up. Every 3 weeks for CBC and injection. Visit in about 4 months.      Susan Shape DNP, AGPCNP-BC 4/10/20159:45 AM

## 2014-01-21 DIAGNOSIS — H2510 Age-related nuclear cataract, unspecified eye: Secondary | ICD-10-CM | POA: Diagnosis not present

## 2014-01-21 DIAGNOSIS — Z961 Presence of intraocular lens: Secondary | ICD-10-CM | POA: Diagnosis not present

## 2014-01-21 DIAGNOSIS — H269 Unspecified cataract: Secondary | ICD-10-CM | POA: Diagnosis not present

## 2014-01-27 ENCOUNTER — Encounter: Payer: Self-pay | Admitting: Internal Medicine

## 2014-01-27 DIAGNOSIS — Z79899 Other long term (current) drug therapy: Secondary | ICD-10-CM | POA: Insufficient documentation

## 2014-01-27 DIAGNOSIS — R7309 Other abnormal glucose: Secondary | ICD-10-CM | POA: Insufficient documentation

## 2014-01-27 NOTE — Progress Notes (Signed)
Patient ID: Susan [name], female   DOB: June 02, 1941, 73 y.o.   MRN: [id number]   Annual Screening Comprehensive Examination  This very nice 73 y.o. WBF presents for complete physical.  Patient has been followed for HTN, GERD, Hypothyroidism,  Prediabetes, Hyperlipidemia, MDS and Vitamin D Deficiency. Patient relates she had a right CE/IOL implant about a week ago with marked improvement in vision and anticipates Left eye surgery in about 3-4 weeks.    HTN predates since the 1980's. Patient's BP has been controlled at home. Today's BP: 166/96 mmHg. She does have Stage CKD with GFR 49. Patient denies any cardiac symptoms as chest pain, palpitations, shortness of breath, dizziness or ankle swelling.   Patient's hyperlipidemia is controlled with diet and Pravastatin. Patient denies myalgias or other medication SE's. Last cholesterol last visit was 161, triglycerides 123, HDL 60 and LDL 76 in Oct 2014 - at goal.   Patient also has multifactorial anemia - possible MDS followed by Dr Alen Blew every 3 weeks and per patient receiving Epo about ever other visit.    Patient is screened for  prediabetes/insulin resistance with several fasting insulins disproportionately elevated high normal in the past and last A1c 4.6%  And insulin was 6 in July 2014. Patient denies reactive hypoglycemic symptoms, visual blurring, diabetic polys, or paresthesias.    Finally, patient has history of Vitamin D Deficiency of 27 in 2012 and last vitamin D was 44 in Oct 2014  Current Outpatient Prescriptions on File Prior to Visit  Medication Sig Dispense Refill  . aspirin 81 MG chewable tablet Chew 81 mg by mouth every morning.       . cetirizine (ZYRTEC) 10 MG tablet Take 10 mg by mouth daily.      . Cholecalciferol (VITAMIN D-3) 5000 UNITS TABS Take 10,000 Units by mouth daily.      . Darbepoetin Alfa-Albumin (ARANESP IJ) Inject as directed.      . diclofenac (VOLTAREN) 75 MG EC tablet Take 75 mg by mouth 2 (two) times daily.       [name] guaiFENesin (MUCINEX) 600 MG 12 hr tablet Take 1 tablet (600 mg total) by mouth 2 (two) times daily.  28 tablet  0  . hydrochlorothiazide (HYDRODIURIL) 25 MG tablet Take 25 mg by mouth daily.      [name] HYDROcodone-acetaminophen (NORCO/VICODIN) 5-325 MG per tablet Take 1 tablet by mouth every 6 (six) hours as needed. For headaches      . labetalol (NORMODYNE) 300 MG tablet Take 1 tablet (300 mg total) by mouth 2 (two) times daily.  180 tablet  99  . omeprazole (PRILOSEC) 20 MG capsule Take 20 mg by mouth every morning.      . pravastatin (PRAVACHOL) 40 MG tablet Take 40 mg by mouth at bedtime.        No current facility-administered medications on file prior to visit.    Allergies  Allergen Reactions  . Codeine Anaphylaxis  . Penicillins Anaphylaxis  . Bactrim Itching and Swelling  . Clarithromycin Other (See Comments)    "Caused skin to peel off my hand."  . Flagyl [Metronidazole Hcl] Itching and Swelling  . Sulfa Antibiotics Itching and Swelling    Past Medical History  Diagnosis Date  . Anemia   . Arthritis   . Myelodysplasia   . Hypertension   . HLD (hyperlipidemia)   . Hypothyroidism   . GERD (gastroesophageal reflux disease)   . Vitamin D deficiency     Past Surgical History  Procedure Laterality  Date  . Umbilical hernia repair    . Rotator cuff repair Left   . Inguinal hernia repair    . Carpal tunnel release Right   . Dilation and curettage of uterus      Family History  Problem Relation Age of Onset  . Stroke Father   . Hypertension Father   . Stroke Sister   . Coronary artery disease Brother   . Hypertension Sister   . Bladder Cancer Sister   . Cancer Mother     History  Substance Use Topics  . Smoking status: Former Smoker    Types: Cigarettes    Quit date: 10/02/1996  . Smokeless tobacco: Never Used  . Alcohol Use: 0.0 oz/week     Comment: glass of wine at least once a month.    ROS Constitutional: Denies fever, chills, weight loss/gain,  headaches, insomnia, fatigue, night sweats, and change in appetite. Eyes: Denies redness, blurred vision, diplopia, discharge, itchy, watery eyes.  ENT: Denies discharge, congestion, post nasal drip, epistaxis, sore throat, earache, hearing loss, dental pain, Tinnitus, Vertigo, Sinus pain, snoring.  Cardio: Denies chest pain, palpitations, irregular heartbeat, syncope, dyspnea, diaphoresis, orthopnea, PND, claudication, edema Respiratory: denies cough, dyspnea, DOE, pleurisy, hoarseness, laryngitis, wheezing.  Gastrointestinal: Denies dysphagia, heartburn, reflux, water brash, pain, cramps, nausea, vomiting, bloating, diarrhea, constipation, hematemesis, melena, hematochezia, jaundice, hemorrhoids Genitourinary: Denies dysuria, frequency, urgency, nocturia, hesitancy, discharge, hematuria, flank pain Breast:Breast lumps, nipple discharge, bleeding.  Musculoskeletal: Denies arthralgia, myalgia, stiffness, Jt. Swelling, pain, limp, and strain/sprain. Skin: Denies puritis, rash, hives, warts, acne, eczema, changing in skin lesion Neuro: No weakness, tremor, incoordination, spasms, paresthesia, pain Psychiatric: Denies confusion, memory loss, sensory loss Endocrine: Denies change in weight, skin, hair change, nocturia, and paresthesia, diabetic polys, visual blurring, hyper / hypo glycemic episodes.  Heme/Lymph: No excessive bleeding, bruising, enlarged lymph nodes.   Physical Exam  BP 166/96  Pulse 64  Temp(Src) 97.7 F (36.5 C) (Temporal)  Resp 18  Ht 5\' 3"  (1.6 m)  Wt 176 lb 6.4 oz (80.015 kg)  BMI 31.26 kg/m2  General Appearance: Well nourished, in no apparent distress. Eyes: PERRLA, EOMs, conjunctiva no swelling or erythema, normal fundi and vessels. Sinuses: No frontal/maxillary tenderness ENT/Mouth: EACs patent / TMs  nl. Nares clear without erythema, swelling, mucoid exudates. Oral hygiene is good. No erythema, swelling, or exudate. Tongue normal, non-obstructing. Tonsils not  swollen or erythematous. Hearing normal.  Neck: Supple, thyroid normal. No bruits, nodes or JVD. Respiratory: Respiratory effort normal.  BS equal and clear bilateral without rales, rhonci, wheezing or stridor. Cardio: Heart sounds are normal with regular rate and rhythm and no murmurs, rubs or gallops. Peripheral pulses are normal and equal bilaterally without edema. No aortic or femoral bruits. Chest: symmetric with normal excursions and percussion. Breasts: Symmetric, without lumps, nipple discharge, retractions, or fibrocystic changes.  Abdomen: Flat, soft, with bowl sounds. Nontender, no guarding, rebound, hernias, masses, or organomegaly.  Lymphatics: Non tender without lymphadenopathy.  Genitourinary:  Musculoskeletal: Full ROM all peripheral extremities, joint stability, 5/5 strength, and normal gait. Skin: Warm and dry without rashes, lesions, cyanosis, clubbing or  ecchymosis.  Neuro: Cranial nerves intact, reflexes equal bilaterally. Normal muscle tone, no cerebellar symptoms. Sensation intact.  Pysch: Awake and oriented X 3, normal affect, Insight and Judgment appropriate.   Assessment and Plan  1. Annual Screening Examination 2. Hypertension  3. Hyperlipidemia 4. Pre Diabetes 5. Vitamin D Deficiency 6. MDS 7. Hypothyroidism  Continue prudent diet as discussed, weight control, BP monitoring,  regular exercise, and medications. Discussed med's effects and SE's. Screening labs and tests as requested with regular follow-up as recommended.

## 2014-01-27 NOTE — Patient Instructions (Signed)

## 2014-01-28 ENCOUNTER — Encounter: Payer: Self-pay | Admitting: Internal Medicine

## 2014-01-28 ENCOUNTER — Ambulatory Visit (INDEPENDENT_AMBULATORY_CARE_PROVIDER_SITE_OTHER): Payer: Medicare Other | Admitting: Internal Medicine

## 2014-01-28 VITALS — BP 166/96 | HR 64 | Temp 97.7°F | Resp 18 | Ht 63.0 in | Wt 176.4 lb

## 2014-01-28 DIAGNOSIS — E782 Mixed hyperlipidemia: Secondary | ICD-10-CM

## 2014-01-28 DIAGNOSIS — Z79899 Other long term (current) drug therapy: Secondary | ICD-10-CM | POA: Diagnosis not present

## 2014-01-28 DIAGNOSIS — Z1331 Encounter for screening for depression: Secondary | ICD-10-CM

## 2014-01-28 DIAGNOSIS — Z1212 Encounter for screening for malignant neoplasm of rectum: Secondary | ICD-10-CM

## 2014-01-28 DIAGNOSIS — I10 Essential (primary) hypertension: Secondary | ICD-10-CM

## 2014-01-28 DIAGNOSIS — E559 Vitamin D deficiency, unspecified: Secondary | ICD-10-CM | POA: Diagnosis not present

## 2014-01-28 DIAGNOSIS — Z789 Other specified health status: Secondary | ICD-10-CM

## 2014-01-28 DIAGNOSIS — R7309 Other abnormal glucose: Secondary | ICD-10-CM | POA: Diagnosis not present

## 2014-01-28 LAB — CBC WITH DIFFERENTIAL/PLATELET
Basophils Absolute: 0 10*3/uL (ref 0.0–0.1)
Basophils Relative: 0 % (ref 0–1)
EOS PCT: 3 % (ref 0–5)
Eosinophils Absolute: 0.1 10*3/uL (ref 0.0–0.7)
HEMATOCRIT: 30.8 % — AB (ref 36.0–46.0)
HEMOGLOBIN: 10.1 g/dL — AB (ref 12.0–15.0)
Lymphocytes Relative: 44 % (ref 12–46)
Lymphs Abs: 1.3 10*3/uL (ref 0.7–4.0)
MCH: 29.2 pg (ref 26.0–34.0)
MCHC: 32.8 g/dL (ref 30.0–36.0)
MCV: 89 fL (ref 78.0–100.0)
MONO ABS: 0.3 10*3/uL (ref 0.1–1.0)
MONOS PCT: 9 % (ref 3–12)
NEUTROS ABS: 1.3 10*3/uL — AB (ref 1.7–7.7)
Neutrophils Relative %: 44 % (ref 43–77)
Platelets: 147 10*3/uL — ABNORMAL LOW (ref 150–400)
RBC: 3.46 MIL/uL — ABNORMAL LOW (ref 3.87–5.11)
RDW: 16.3 % — ABNORMAL HIGH (ref 11.5–15.5)
WBC: 3 10*3/uL — ABNORMAL LOW (ref 4.0–10.5)

## 2014-01-28 LAB — HEMOGLOBIN A1C
Hgb A1c MFr Bld: 4.7 % (ref <5.7)
MEAN PLASMA GLUCOSE: 88 mg/dL (ref <117)

## 2014-01-28 MED ORDER — LOSARTAN POTASSIUM-HCTZ 100-25 MG PO TABS: 1.0000 | ORAL_TABLET | Freq: Every day | ORAL | Status: DC

## 2014-01-28 NOTE — Progress Notes (Signed)
Patient ID: Susan [name], female   DOB: 04/22/1941, 73 y.o.   MRN: [id number]   Please note patient had gHyzaar 100/25 to her med regimen as she had recently been off of Benicar due to unavailability of Sx's.

## 2014-01-29 ENCOUNTER — Telehealth: Payer: Self-pay | Admitting: *Deleted

## 2014-01-29 ENCOUNTER — Other Ambulatory Visit: Payer: Self-pay | Admitting: Emergency Medicine

## 2014-01-29 DIAGNOSIS — D649 Anemia, unspecified: Secondary | ICD-10-CM

## 2014-01-29 LAB — BASIC METABOLIC PANEL WITH GFR
BUN: 20 mg/dL (ref 6–23)
CHLORIDE: 106 meq/L (ref 96–112)
CO2: 27 mEq/L (ref 19–32)
CREATININE: 1.21 mg/dL — AB (ref 0.50–1.10)
Calcium: 9.8 mg/dL (ref 8.4–10.5)
GFR, Est African American: 51 mL/min — ABNORMAL LOW
GFR, Est Non African American: 44 mL/min — ABNORMAL LOW
GLUCOSE: 83 mg/dL (ref 70–99)
POTASSIUM: 4 meq/L (ref 3.5–5.3)
Sodium: 141 mEq/L (ref 135–145)

## 2014-01-29 LAB — TSH: TSH: 4.432 u[IU]/mL (ref 0.350–4.500)

## 2014-01-29 LAB — HEPATIC FUNCTION PANEL
ALBUMIN: 4.2 g/dL (ref 3.5–5.2)
ALT: 10 U/L (ref 0–35)
AST: 17 U/L (ref 0–37)
Alkaline Phosphatase: 73 U/L (ref 39–117)
BILIRUBIN INDIRECT: 0.6 mg/dL (ref 0.2–1.2)
Bilirubin, Direct: 0.2 mg/dL (ref 0.0–0.3)
Total Bilirubin: 0.8 mg/dL (ref 0.2–1.2)
Total Protein: 7.2 g/dL (ref 6.0–8.3)

## 2014-01-29 LAB — MICROALBUMIN / CREATININE URINE RATIO
CREATININE, URINE: 136.4 mg/dL
MICROALB/CREAT RATIO: 146.2 mg/g — AB (ref 0.0–30.0)
Microalb, Ur: 19.94 mg/dL — ABNORMAL HIGH (ref 0.00–1.89)

## 2014-01-29 LAB — LIPID PANEL
Cholesterol: 163 mg/dL (ref 0–200)
HDL: 69 mg/dL (ref >39)
LDL CALC: 73 mg/dL (ref 0–99)
Total CHOL/HDL Ratio: 2.4 Ratio
Triglycerides: 106 mg/dL (ref <150)
VLDL: 21 mg/dL (ref 0–40)

## 2014-01-29 LAB — URINALYSIS, MICROSCOPIC ONLY
Casts: NONE SEEN
Crystals: NONE SEEN

## 2014-01-29 LAB — INSULIN, FASTING: Insulin fasting, serum: 10 u[IU]/mL (ref 3–28)

## 2014-01-29 LAB — VITAMIN D 25 HYDROXY (VIT D DEFICIENCY, FRACTURES): Vit D, 25-Hydroxy: 61 ng/mL (ref 30–89)

## 2014-01-29 LAB — MAGNESIUM: Magnesium: 1.7 mg/dL (ref 1.5–2.5)

## 2014-01-29 MED ORDER — DICLOFENAC SODIUM 75 MG PO TBEC: 75.0000 mg | DELAYED_RELEASE_TABLET | Freq: Two times a day (BID) | ORAL | Status: DC

## 2014-01-29 NOTE — Telephone Encounter (Signed)
Pt needed a refill also of voltern 75mg  yesterday rx wasn't at pharm will you send in? Thanks

## 2014-01-31 ENCOUNTER — Ambulatory Visit (HOSPITAL_BASED_OUTPATIENT_CLINIC_OR_DEPARTMENT_OTHER): Payer: Medicare Other

## 2014-01-31 ENCOUNTER — Other Ambulatory Visit (HOSPITAL_BASED_OUTPATIENT_CLINIC_OR_DEPARTMENT_OTHER): Payer: Medicare Other

## 2014-01-31 VITALS — BP 141/78 | HR 65 | Temp 98.4°F

## 2014-01-31 DIAGNOSIS — D649 Anemia, unspecified: Secondary | ICD-10-CM

## 2014-01-31 DIAGNOSIS — N289 Disorder of kidney and ureter, unspecified: Secondary | ICD-10-CM | POA: Diagnosis not present

## 2014-01-31 LAB — CBC WITH DIFFERENTIAL/PLATELET
BASO%: 0 % (ref 0.0–2.0)
Basophils Absolute: 0 10*3/uL (ref 0.0–0.1)
EOS ABS: 0.1 10*3/uL (ref 0.0–0.5)
EOS%: 2.8 % (ref 0.0–7.0)
HCT: 30.4 % — ABNORMAL LOW (ref 34.8–46.6)
HGB: 10.3 g/dL — ABNORMAL LOW (ref 11.6–15.9)
LYMPH#: 1.6 10*3/uL (ref 0.9–3.3)
LYMPH%: 48.4 % (ref 14.0–49.7)
MCH: 29.7 pg (ref 25.1–34.0)
MCHC: 33.9 g/dL (ref 31.5–36.0)
MCV: 87.6 fL (ref 79.5–101.0)
MONO#: 0.3 10*3/uL (ref 0.1–0.9)
MONO%: 9.9 % (ref 0.0–14.0)
NEUT%: 38.9 % (ref 38.4–76.8)
NEUTROS ABS: 1.3 10*3/uL — AB (ref 1.5–6.5)
Platelets: 117 10*3/uL — ABNORMAL LOW (ref 145–400)
RBC: 3.47 10*6/uL — ABNORMAL LOW (ref 3.70–5.45)
RDW: 15.6 % — AB (ref 11.2–14.5)
WBC: 3.2 10*3/uL — AB (ref 3.9–10.3)

## 2014-01-31 MED ORDER — DARBEPOETIN ALFA-POLYSORBATE 300 MCG/0.6ML IJ SOLN
300.0000 ug | INTRAMUSCULAR | Status: DC
  Administered 2014-01-31: 300 ug via SUBCUTANEOUS
  Filled 2014-01-31: qty 0.6

## 2014-02-10 DIAGNOSIS — H2510 Age-related nuclear cataract, unspecified eye: Secondary | ICD-10-CM | POA: Diagnosis not present

## 2014-02-10 DIAGNOSIS — H25019 Cortical age-related cataract, unspecified eye: Secondary | ICD-10-CM | POA: Diagnosis not present

## 2014-02-18 DIAGNOSIS — Z961 Presence of intraocular lens: Secondary | ICD-10-CM | POA: Diagnosis not present

## 2014-02-18 DIAGNOSIS — H2510 Age-related nuclear cataract, unspecified eye: Secondary | ICD-10-CM | POA: Diagnosis not present

## 2014-02-18 DIAGNOSIS — H269 Unspecified cataract: Secondary | ICD-10-CM | POA: Diagnosis not present

## 2014-02-20 ENCOUNTER — Encounter: Payer: Self-pay | Admitting: Internal Medicine

## 2014-02-21 ENCOUNTER — Ambulatory Visit: Payer: Medicare Other

## 2014-02-21 ENCOUNTER — Other Ambulatory Visit (HOSPITAL_BASED_OUTPATIENT_CLINIC_OR_DEPARTMENT_OTHER): Payer: Medicare Other

## 2014-02-21 DIAGNOSIS — D649 Anemia, unspecified: Secondary | ICD-10-CM

## 2014-02-21 LAB — CBC WITH DIFFERENTIAL/PLATELET
BASO%: 0.3 % (ref 0.0–2.0)
Basophils Absolute: 0 10*3/uL (ref 0.0–0.1)
EOS ABS: 0.1 10*3/uL (ref 0.0–0.5)
EOS%: 2.3 % (ref 0.0–7.0)
HCT: 34.3 % — ABNORMAL LOW (ref 34.8–46.6)
HGB: 11.5 g/dL — ABNORMAL LOW (ref 11.6–15.9)
LYMPH%: 47.2 % (ref 14.0–49.7)
MCH: 29 pg (ref 25.1–34.0)
MCHC: 33.5 g/dL (ref 31.5–36.0)
MCV: 86.4 fL (ref 79.5–101.0)
MONO#: 0.2 10*3/uL (ref 0.1–0.9)
MONO%: 7.5 % (ref 0.0–14.0)
NEUT%: 42.7 % (ref 38.4–76.8)
NEUTROS ABS: 1.3 10*3/uL — AB (ref 1.5–6.5)
NRBC: 0 % (ref 0–0)
PLATELETS: 145 10*3/uL (ref 145–400)
RBC: 3.97 10*6/uL (ref 3.70–5.45)
RDW: 14.9 % — ABNORMAL HIGH (ref 11.2–14.5)
WBC: 3.1 10*3/uL — AB (ref 3.9–10.3)
lymph#: 1.4 10*3/uL (ref 0.9–3.3)

## 2014-02-21 MED ORDER — DARBEPOETIN ALFA-POLYSORBATE 300 MCG/0.6ML IJ SOLN: 300.0000 ug | INTRAMUSCULAR | Status: DC

## 2014-02-21 NOTE — Progress Notes (Signed)
Aranesp not given. HGB 11.5.  Patient aware.

## 2014-02-26 DIAGNOSIS — H2510 Age-related nuclear cataract, unspecified eye: Secondary | ICD-10-CM | POA: Diagnosis not present

## 2014-02-26 DIAGNOSIS — Z961 Presence of intraocular lens: Secondary | ICD-10-CM | POA: Diagnosis not present

## 2014-02-27 ENCOUNTER — Telehealth: Payer: Self-pay | Admitting: *Deleted

## 2014-02-27 NOTE — Telephone Encounter (Signed)
Patient called. States BP low at cataract surgery follow up visit.  BP was 80/47 and went up to 112/59 about 1 hour later.  BP today is 120/60.  Per Dr Melford Aase, hold Losartan-HCTZ  100-25 mg until BP 140/90 and then restart at 1/2 tab daily.  Also, reduce Labetalol 300 mg BID to 1/2 tab BID.  Patient aware.

## 2014-03-14 ENCOUNTER — Other Ambulatory Visit: Payer: Medicare Other

## 2014-03-14 ENCOUNTER — Ambulatory Visit: Payer: Medicare Other

## 2014-03-14 VITALS — BP 185/90 | HR 61 | Temp 97.7°F

## 2014-03-14 DIAGNOSIS — D649 Anemia, unspecified: Secondary | ICD-10-CM

## 2014-03-14 LAB — CBC WITH DIFFERENTIAL/PLATELET
BASO%: 0.3 % (ref 0.0–2.0)
Basophils Absolute: 0 10*3/uL (ref 0.0–0.1)
EOS ABS: 0.1 10*3/uL (ref 0.0–0.5)
EOS%: 1.8 % (ref 0.0–7.0)
HEMATOCRIT: 30.7 % — AB (ref 34.8–46.6)
HEMOGLOBIN: 10.4 g/dL — AB (ref 11.6–15.9)
LYMPH%: 44.2 % (ref 14.0–49.7)
MCH: 28.6 pg (ref 25.1–34.0)
MCHC: 33.9 g/dL (ref 31.5–36.0)
MCV: 84.3 fL (ref 79.5–101.0)
MONO#: 0.3 10*3/uL (ref 0.1–0.9)
MONO%: 7.8 % (ref 0.0–14.0)
NEUT#: 1.8 10*3/uL (ref 1.5–6.5)
NEUT%: 45.9 % (ref 38.4–76.8)
NRBC: 0 % (ref 0–0)
PLATELETS: 148 10*3/uL (ref 145–400)
RBC: 3.64 10*6/uL — ABNORMAL LOW (ref 3.70–5.45)
RDW: 14.2 % (ref 11.2–14.5)
WBC: 3.9 10*3/uL (ref 3.9–10.3)
lymph#: 1.7 10*3/uL (ref 0.9–3.3)

## 2014-03-14 MED ORDER — DARBEPOETIN ALFA-POLYSORBATE 300 MCG/0.6ML IJ SOLN
300.0000 ug | INTRAMUSCULAR | Status: DC
  Filled 2014-03-14: qty 0.6

## 2014-03-15 ENCOUNTER — Telehealth: Payer: Self-pay | Admitting: Oncology

## 2014-03-15 NOTE — Telephone Encounter (Signed)
HOLIDAY - 7/3 MOVED TO 7/2. S/W PT SHE IS AWARE.

## 2014-03-21 ENCOUNTER — Encounter: Payer: Self-pay | Admitting: Emergency Medicine

## 2014-03-21 ENCOUNTER — Ambulatory Visit (INDEPENDENT_AMBULATORY_CARE_PROVIDER_SITE_OTHER): Payer: Medicare Other | Admitting: Emergency Medicine

## 2014-03-21 ENCOUNTER — Other Ambulatory Visit: Payer: Self-pay | Admitting: Emergency Medicine

## 2014-03-21 VITALS — BP 126/78 | HR 76 | Temp 98.4°F | Resp 16 | Ht 63.0 in | Wt 176.0 lb

## 2014-03-21 DIAGNOSIS — E538 Deficiency of other specified B group vitamins: Secondary | ICD-10-CM | POA: Diagnosis not present

## 2014-03-21 DIAGNOSIS — I10 Essential (primary) hypertension: Secondary | ICD-10-CM

## 2014-03-21 DIAGNOSIS — D649 Anemia, unspecified: Secondary | ICD-10-CM | POA: Diagnosis not present

## 2014-03-21 LAB — CBC WITH DIFFERENTIAL/PLATELET
BASOS ABS: 0 10*3/uL (ref 0.0–0.1)
Basophils Relative: 0 % (ref 0–1)
EOS PCT: 2 % (ref 0–5)
Eosinophils Absolute: 0.1 10*3/uL (ref 0.0–0.7)
HEMATOCRIT: 31 % — AB (ref 36.0–46.0)
HEMOGLOBIN: 10.5 g/dL — AB (ref 12.0–15.0)
LYMPHS PCT: 45 % (ref 12–46)
Lymphs Abs: 1.7 10*3/uL (ref 0.7–4.0)
MCH: 28.7 pg (ref 26.0–34.0)
MCHC: 33.9 g/dL (ref 30.0–36.0)
MCV: 84.7 fL (ref 78.0–100.0)
MONO ABS: 0.3 10*3/uL (ref 0.1–1.0)
MONOS PCT: 8 % (ref 3–12)
NEUTROS ABS: 1.7 10*3/uL (ref 1.7–7.7)
Neutrophils Relative %: 45 % (ref 43–77)
Platelets: 153 10*3/uL (ref 150–400)
RBC: 3.66 MIL/uL — ABNORMAL LOW (ref 3.87–5.11)
RDW: 14.4 % (ref 11.5–15.5)
WBC: 3.8 10*3/uL — AB (ref 4.0–10.5)

## 2014-03-21 MED ORDER — OLMESARTAN-AMLODIPINE-HCTZ 40-10-25 MG PO TABS: ORAL_TABLET | ORAL | Status: DC

## 2014-03-21 MED ORDER — HYDROCODONE-ACETAMINOPHEN 5-325 MG PO TABS: 1.0000 | ORAL_TABLET | Freq: Four times a day (QID) | ORAL | Status: DC | PRN

## 2014-03-21 MED ORDER — DICLOFENAC SODIUM 75 MG PO TBEC: 75.0000 mg | DELAYED_RELEASE_TABLET | Freq: Two times a day (BID) | ORAL | Status: DC

## 2014-03-21 NOTE — Progress Notes (Signed)
Subjective:    Patient ID: Susan [name], female    DOB: [birth year], 73 y.o.   MRN: [id number]  HPI Comments: 73 yo AAF with f/u for HTN. She could not tolerate the Hyzaar it dropped her BP to 80/47 and felt awful. She had stopped her Tribenzor per DR [facility]. SHe stopped Hyzaar and restarted HCTZ and Labetalol and has had improvement but she notes will go up to 180s without norvasc.     Medication List       This list is accurate as of: 03/21/14 10:33 AM.  Always use your most recent med list.               ARANESP IJ  Inject as directed.     aspirin 81 MG chewable tablet  Chew 81 mg by mouth every morning.     diclofenac 75 MG EC tablet  Commonly known as:  VOLTAREN  Take 1 tablet (75 mg total) by mouth 2 (two) times daily.     hydrochlorothiazide 25 MG tablet  Commonly known as:  HYDRODIURIL  Take 25 mg by mouth daily.     HYDROcodone-acetaminophen 5-325 MG per tablet  Commonly known as:  NORCO/VICODIN  Take 1 tablet by mouth every 6 (six) hours as needed. For headaches     labetalol 300 MG tablet  Commonly known as:  NORMODYNE  Take 1 tablet (300 mg total) by mouth 2 (two) times daily.     levothyroxine 100 MCG tablet  Commonly known as:  SYNTHROID, LEVOTHROID  Take 100 mcg by mouth daily before breakfast. Takes 100 mcg 6 days a week and none on Mondays     omeprazole 20 MG capsule  Commonly known as:  PRILOSEC  Take 20 mg by mouth every morning.     pravastatin 40 MG tablet  Commonly known as:  PRAVACHOL  Take 40 mg by mouth at bedtime.     Vitamin D-3 5000 UNITS Tabs  Take 10,000 Units by mouth daily.       Allergies  Allergen Reactions  . Codeine Anaphylaxis  . Penicillins Anaphylaxis  . Bactrim Itching and Swelling  . Clarithromycin Other (See Comments)    "Caused skin to peel off my hand."  . Flagyl [Metronidazole Hcl] Itching and Swelling  . Sulfa Antibiotics Itching and Swelling   Past Medical History  Diagnosis Date  . Anemia   .  Arthritis   . Myelodysplasia   . Hypertension   . HLD (hyperlipidemia)   . Hypothyroidism   . GERD (gastroesophageal reflux disease)   . Vitamin D deficiency      Review of Systems  All other systems reviewed and are negative.  BP 126/78  Pulse 76  Temp(Src) 98.4 F (36.9 C) (Temporal)  Resp 16  Ht 5\' 3"  (1.6 m)  Wt 176 lb (79.833 kg)  BMI 31.18 kg/m2     Objective:   Physical Exam  Nursing note and vitals reviewed. Constitutional: She is oriented to person, place, and time. She appears well-developed and well-nourished.  HENT:  Head: Normocephalic and atraumatic.  Right Ear: External ear normal.  Left Ear: External ear normal.  Nose: Nose normal.  Mouth/Throat: Oropharynx is clear and moist.  Eyes: Conjunctivae and EOM are normal.  Neck: Normal range of motion.  Cardiovascular: Normal rate, regular rhythm, normal heart sounds and intact distal pulses.   Pulmonary/Chest: Effort normal and breath sounds normal.  Musculoskeletal: Normal range of motion.  Lymphadenopathy:    She has no  cervical adenopathy.  Neurological: She is alert and oriented to person, place, and time.  Skin: Skin is warm and dry.  Psychiatric: She has a normal mood and affect. Judgment normal.          Assessment & Plan:  1. HTN- Check BP call if >130/80, increase cardio Stop HCTZ and restart Tribenzor 40/10/25 1/2 QD. Check labs. w/c if SX increase or ER.

## 2014-03-21 NOTE — Patient Instructions (Signed)
Hypertension STOP HCTZ and restart Tribenzor 40/10/25 1/2 tablet daily Hypertension is another name for high blood pressure. High blood pressure forces your heart to work harder to pump blood. A blood pressure reading has two numbers, which includes a higher number over a lower number (example: 110/72). HOME CARE   Have your blood pressure rechecked by your doctor.  Only take medicine as told by your doctor. Follow the directions carefully. The medicine does not work as well if you skip doses. Skipping doses also puts you at risk for problems.  Do not smoke.  Monitor your blood pressure at home as told by your doctor. GET HELP IF:  You think you are having a reaction to the medicine you are taking.  You have repeat headaches or feel dizzy.  You have puffiness (swelling) in your ankles.  You have trouble with your vision. GET HELP RIGHT AWAY IF:   You get a very bad headache and are confused.  You feel weak, numb, or faint.  You get chest or belly (abdominal) pain.  You throw up (vomit).  You cannot breathe very well. MAKE SURE YOU:   Understand these instructions.  Will watch your condition.  Will get help right away if you are not doing well or get worse. Document Released: 03/07/2008 Document Revised: 09/24/2013 Document Reviewed: 07/12/2013 [facility] Patient Information 2015 [place], [place]. This information is not intended to replace advice given to you by your health care provider. Make sure you discuss any questions you have with your health care provider.

## 2014-03-22 LAB — BASIC METABOLIC PANEL WITH GFR
BUN: 28 mg/dL — AB (ref 6–23)
CHLORIDE: 103 meq/L (ref 96–112)
CO2: 25 mEq/L (ref 19–32)
Calcium: 9.6 mg/dL (ref 8.4–10.5)
Creat: 1.29 mg/dL — ABNORMAL HIGH (ref 0.50–1.10)
GFR, Est African American: 47 mL/min — ABNORMAL LOW
GFR, Est Non African American: 41 mL/min — ABNORMAL LOW
Glucose, Bld: 88 mg/dL (ref 70–99)
POTASSIUM: 4 meq/L (ref 3.5–5.3)
Sodium: 139 mEq/L (ref 135–145)

## 2014-03-24 LAB — IRON AND TIBC
%SAT: 43 % (ref 20–55)
Iron: 127 ug/dL (ref 42–145)
TIBC: 294 ug/dL (ref 250–470)
UIBC: 167 ug/dL (ref 125–400)

## 2014-03-24 LAB — VITAMIN B12: Vitamin B-12: 430 pg/mL (ref 211–911)

## 2014-04-03 ENCOUNTER — Ambulatory Visit (HOSPITAL_BASED_OUTPATIENT_CLINIC_OR_DEPARTMENT_OTHER): Payer: Medicare Other

## 2014-04-03 ENCOUNTER — Other Ambulatory Visit: Payer: Medicare Other

## 2014-04-03 VITALS — BP 117/67 | HR 66 | Temp 98.4°F

## 2014-04-03 DIAGNOSIS — D649 Anemia, unspecified: Secondary | ICD-10-CM

## 2014-04-03 DIAGNOSIS — N289 Disorder of kidney and ureter, unspecified: Secondary | ICD-10-CM | POA: Diagnosis not present

## 2014-04-03 LAB — CBC WITH DIFFERENTIAL/PLATELET
BASO%: 0.5 % (ref 0.0–2.0)
Basophils Absolute: 0 10*3/uL (ref 0.0–0.1)
EOS%: 2.3 % (ref 0.0–7.0)
Eosinophils Absolute: 0.1 10*3/uL (ref 0.0–0.5)
HCT: 27.9 % — ABNORMAL LOW (ref 34.8–46.6)
HGB: 9.4 g/dL — ABNORMAL LOW (ref 11.6–15.9)
LYMPH%: 43.7 % (ref 14.0–49.7)
MCH: 29 pg (ref 25.1–34.0)
MCHC: 33.6 g/dL (ref 31.5–36.0)
MCV: 86.3 fL (ref 79.5–101.0)
MONO#: 0.3 10*3/uL (ref 0.1–0.9)
MONO%: 8.1 % (ref 0.0–14.0)
NEUT#: 1.6 10*3/uL (ref 1.5–6.5)
NEUT%: 45.4 % (ref 38.4–76.8)
PLATELETS: 105 10*3/uL — AB (ref 145–400)
RBC: 3.23 10*6/uL — AB (ref 3.70–5.45)
RDW: 14.1 % (ref 11.2–14.5)
WBC: 3.6 10*3/uL — AB (ref 3.9–10.3)
lymph#: 1.6 10*3/uL (ref 0.9–3.3)

## 2014-04-03 MED ORDER — DARBEPOETIN ALFA-POLYSORBATE 300 MCG/0.6ML IJ SOLN
300.0000 ug | INTRAMUSCULAR | Status: DC
  Administered 2014-04-03: 300 ug via SUBCUTANEOUS
  Filled 2014-04-03: qty 0.6

## 2014-04-04 ENCOUNTER — Ambulatory Visit: Payer: Medicare Other

## 2014-04-04 ENCOUNTER — Other Ambulatory Visit: Payer: Medicare Other

## 2014-04-11 DIAGNOSIS — H1130 Conjunctival hemorrhage, unspecified eye: Secondary | ICD-10-CM | POA: Diagnosis not present

## 2014-04-11 DIAGNOSIS — H35039 Hypertensive retinopathy, unspecified eye: Secondary | ICD-10-CM | POA: Diagnosis not present

## 2014-04-11 DIAGNOSIS — Z961 Presence of intraocular lens: Secondary | ICD-10-CM | POA: Diagnosis not present

## 2014-04-11 DIAGNOSIS — H43819 Vitreous degeneration, unspecified eye: Secondary | ICD-10-CM | POA: Diagnosis not present

## 2014-04-25 ENCOUNTER — Ambulatory Visit (HOSPITAL_BASED_OUTPATIENT_CLINIC_OR_DEPARTMENT_OTHER): Payer: Medicare Other

## 2014-04-25 ENCOUNTER — Other Ambulatory Visit (HOSPITAL_BASED_OUTPATIENT_CLINIC_OR_DEPARTMENT_OTHER): Payer: Medicare Other

## 2014-04-25 VITALS — BP 112/63 | HR 64 | Temp 98.1°F

## 2014-04-25 DIAGNOSIS — D649 Anemia, unspecified: Secondary | ICD-10-CM

## 2014-04-25 DIAGNOSIS — N289 Disorder of kidney and ureter, unspecified: Secondary | ICD-10-CM

## 2014-04-25 DIAGNOSIS — N189 Chronic kidney disease, unspecified: Secondary | ICD-10-CM

## 2014-04-25 DIAGNOSIS — D631 Anemia in chronic kidney disease: Secondary | ICD-10-CM

## 2014-04-25 LAB — CBC WITH DIFFERENTIAL/PLATELET
BASO%: 0.5 % (ref 0.0–2.0)
Basophils Absolute: 0 10*3/uL (ref 0.0–0.1)
EOS%: 2.1 % (ref 0.0–7.0)
Eosinophils Absolute: 0.1 10*3/uL (ref 0.0–0.5)
HEMATOCRIT: 30.6 % — AB (ref 34.8–46.6)
HGB: 10.2 g/dL — ABNORMAL LOW (ref 11.6–15.9)
LYMPH#: 1.3 10*3/uL (ref 0.9–3.3)
LYMPH%: 38 % (ref 14.0–49.7)
MCH: 29.7 pg (ref 25.1–34.0)
MCHC: 33.4 g/dL (ref 31.5–36.0)
MCV: 88.9 fL (ref 79.5–101.0)
MONO#: 0.3 10*3/uL (ref 0.1–0.9)
MONO%: 7.4 % (ref 0.0–14.0)
NEUT#: 1.8 10*3/uL (ref 1.5–6.5)
NEUT%: 52 % (ref 38.4–76.8)
Platelets: 129 10*3/uL — ABNORMAL LOW (ref 145–400)
RBC: 3.44 10*6/uL — ABNORMAL LOW (ref 3.70–5.45)
RDW: 16.2 % — ABNORMAL HIGH (ref 11.2–14.5)
WBC: 3.5 10*3/uL — AB (ref 3.9–10.3)

## 2014-04-25 MED ORDER — DARBEPOETIN ALFA-POLYSORBATE 300 MCG/0.6ML IJ SOLN
300.0000 ug | INTRAMUSCULAR | Status: DC
  Administered 2014-04-25: 300 ug via SUBCUTANEOUS
  Filled 2014-04-25: qty 0.6

## 2014-04-30 ENCOUNTER — Encounter: Payer: Self-pay | Admitting: Internal Medicine

## 2014-04-30 ENCOUNTER — Ambulatory Visit: Payer: Self-pay | Admitting: Physician Assistant

## 2014-05-13 ENCOUNTER — Other Ambulatory Visit: Payer: Medicare Other

## 2014-05-13 ENCOUNTER — Ambulatory Visit: Payer: Medicare Other | Admitting: Oncology

## 2014-05-16 ENCOUNTER — Other Ambulatory Visit (HOSPITAL_BASED_OUTPATIENT_CLINIC_OR_DEPARTMENT_OTHER): Payer: Medicare Other

## 2014-05-16 ENCOUNTER — Ambulatory Visit (HOSPITAL_BASED_OUTPATIENT_CLINIC_OR_DEPARTMENT_OTHER): Payer: Medicare Other

## 2014-05-16 ENCOUNTER — Encounter: Payer: Self-pay | Admitting: Oncology

## 2014-05-16 ENCOUNTER — Ambulatory Visit (HOSPITAL_BASED_OUTPATIENT_CLINIC_OR_DEPARTMENT_OTHER): Payer: Medicare Other | Admitting: Oncology

## 2014-05-16 VITALS — BP 126/66 | HR 63 | Temp 98.1°F | Resp 18 | Ht 63.0 in | Wt 179.3 lb

## 2014-05-16 DIAGNOSIS — D649 Anemia, unspecified: Secondary | ICD-10-CM

## 2014-05-16 DIAGNOSIS — I10 Essential (primary) hypertension: Secondary | ICD-10-CM

## 2014-05-16 LAB — CBC WITH DIFFERENTIAL/PLATELET
BASO%: 0.5 % (ref 0.0–2.0)
Basophils Absolute: 0 10*3/uL (ref 0.0–0.1)
EOS%: 2.2 % (ref 0.0–7.0)
Eosinophils Absolute: 0.1 10*3/uL (ref 0.0–0.5)
HEMATOCRIT: 34.2 % — AB (ref 34.8–46.6)
HGB: 11.3 g/dL — ABNORMAL LOW (ref 11.6–15.9)
LYMPH%: 45 % (ref 14.0–49.7)
MCH: 29.4 pg (ref 25.1–34.0)
MCHC: 33.1 g/dL (ref 31.5–36.0)
MCV: 88.7 fL (ref 79.5–101.0)
MONO#: 0.2 10*3/uL (ref 0.1–0.9)
MONO%: 7.7 % (ref 0.0–14.0)
NEUT#: 1.5 10*3/uL (ref 1.5–6.5)
NEUT%: 44.6 % (ref 38.4–76.8)
Platelets: 139 10*3/uL — ABNORMAL LOW (ref 145–400)
RBC: 3.86 10*6/uL (ref 3.70–5.45)
RDW: 16 % — ABNORMAL HIGH (ref 11.2–14.5)
WBC: 3.3 10*3/uL — ABNORMAL LOW (ref 3.9–10.3)
lymph#: 1.5 10*3/uL (ref 0.9–3.3)

## 2014-05-16 MED ORDER — DARBEPOETIN ALFA-POLYSORBATE 300 MCG/0.6ML IJ SOLN: 300.0000 ug | INTRAMUSCULAR | Status: DC

## 2014-05-16 NOTE — Progress Notes (Signed)
Hematology and Oncology Follow Up Visit  Susan [name] [id number] 1941/05/02 73 y.o. 05/16/2014 9:21 AM Susan [name],Susan DAVID, MDMcKeown, Susan Saxon, MD   Principle Diagnosis: This is a 73 year old woman with multifactorial anemia. 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  Secondary Diagnosis: Controlled hypertension.  Current therapy: She is on Aranesp 300 mcg every 3 weeks to keep her hemoglobin above 11.  Interim History:  [name] presents today for a follow-up visit. Since her last visit, she reports no new complaints. She is tolerating Aranesp very well and it was able to really help with her overall quality of life and her performance status. She really functions well when her hemoglobin is close to 11. She has not had any injection-related problems. Overall, her performance status and activity level remain at a reasonable range at this time. She denies bleeding. He has not reported any fevers or chills or sweats. Has not reported any weight loss or constitutional symptoms. She denies any headaches or blurred vision or syncope. Her blood pressure have been under better control. She has not reported any chest pain or palpitation. Does not report any shortness of breath or wheezing. She does not report any nausea or vomiting or change in her bowel habits. She does not report any hematochezia or melena. She does not report any lymphadenopathy or petechiae. Rest of her review of systems unremarkable.  Medications: I have reviewed the patient's current medications.  Current Outpatient Prescriptions  Medication Sig Dispense Refill  . aspirin 81 MG chewable tablet Chew 81 mg by mouth every morning.       . Cholecalciferol (VITAMIN D-3) 5000 UNITS TABS Take 10,000 Units by mouth daily.      . Darbepoetin Alfa-Albumin (ARANESP IJ) Inject as directed.      . diclofenac (VOLTAREN) 75 MG EC tablet Take 1 tablet (75 mg total) by mouth 2 (two) times daily.  60 tablet   1  . hydrochlorothiazide (HYDRODIURIL) 25 MG tablet Take 25 mg by mouth daily.      [name] HYDROcodone-acetaminophen (NORCO/VICODIN) 5-325 MG per tablet Take 1 tablet by mouth every 6 (six) hours as needed. For headaches  100 tablet  0  . labetalol (NORMODYNE) 300 MG tablet Take 1 tablet (300 mg total) by mouth 2 (two) times daily.  180 tablet  99  . levothyroxine (SYNTHROID, LEVOTHROID) 100 MCG tablet Take 100 mcg by mouth daily before breakfast. Takes 100 mcg 6 days a week and none on Mondays      . Olmesartan-Amlodipine-HCTZ 40-10-25 MG TABS 1/2 to 1 po QD  94 tablet  0  . omeprazole (PRILOSEC) 20 MG capsule Take 20 mg by mouth every morning.      . pravastatin (PRAVACHOL) 40 MG tablet Take 40 mg by mouth at bedtime.        No current facility-administered medications for this visit.    Allergies:  Allergies  Allergen Reactions  . Codeine Anaphylaxis  . Penicillins Anaphylaxis  . Bactrim Itching and Swelling  . Clarithromycin Other (See Comments)    "Caused skin to peel off my hand."  . Flagyl [Metronidazole Hcl] Itching and Swelling  . Sulfa Antibiotics Itching and Swelling    Past Medical History, Surgical history, Social history, and Family History were reviewed and updated.  Review of Systems:  Remaining ROS negative.  Physical Exam: Blood pressure 126/66, pulse 63, temperature 98.1 F (36.7 C), temperature source Oral, resp. rate 18, height _0  (1.6 m), weight 179 lb  4.8 oz (81.33 kg). ECOG: 1 General appearance: alert, cooperative and no distress Head: Normocephalic, without obvious abnormality, atraumatic Neck: no adenopathy Lymph nodes: Cervical, supraclavicular, and axillary nodes normal. Heart:regular rate and rhythm, S1, S2 normal, no murmur, click, rub or gallop Lung:chest clear, no wheezing, rales, normal symmetric air entry Abdomen: soft, non-tender, without masses or organomegaly EXT:no erythema, induration, or nodules   Lab Results: Lab Results  Component  Value Date   WBC 3.3* 05/16/2014   HGB 11.3* 05/16/2014   HCT 34.2* 05/16/2014   MCV 88.7 05/16/2014   PLT 139* 05/16/2014     Chemistry      Component Value Date/Time   NA 139 03/21/2014 1048   K 4.0 03/21/2014 1048   CL 103 03/21/2014 1048   CO2 25 03/21/2014 1048   BUN 28* 03/21/2014 1048   CREATININE 1.29* 03/21/2014 1048   CREATININE 1.28* 06/19/2012 2317      Component Value Date/Time   CALCIUM 9.6 03/21/2014 1048   ALKPHOS 73 01/28/2014 1038   AST 17 01/28/2014 1038   ALT 10 01/28/2014 1038   BILITOT 0.8 01/28/2014 1038     Impression and Plan:  [name] with the following issues:  1. Multifactorial anemia: She has an element of anemia of renal disease as well as anemia of possible myelodysplasia. Hemoglobin is 11.3 today. She will not receive her Aranesp.The plan is to keep her on Aranesp 300 mcg every 3 weeks to keep her hemoglobin above 11.  2. Hypertension:  Her blood pressure under excellent control today. 3. History of questionable myelodysplasia: I do not really see any evidence of that at this point in peripheral blood, but, certainly, if she develops progressive cytopenias or refractory anemia, then we will restage her with a bone marrow biopsy. 4. Follow up. Every 3 weeks for CBC and injection. Visit in about 6 months.       [facility] MD 8/14/20159:21 AM

## 2014-05-20 ENCOUNTER — Telehealth: Payer: Self-pay | Admitting: Oncology

## 2014-05-20 NOTE — Telephone Encounter (Signed)
Lvm advising next appt 9/4 for lab/inj and mailed appt caledanrs for Sept - Feb 2016.

## 2014-06-06 ENCOUNTER — Other Ambulatory Visit (HOSPITAL_BASED_OUTPATIENT_CLINIC_OR_DEPARTMENT_OTHER): Payer: Medicare Other

## 2014-06-06 ENCOUNTER — Ambulatory Visit (HOSPITAL_BASED_OUTPATIENT_CLINIC_OR_DEPARTMENT_OTHER): Payer: Medicare Other

## 2014-06-06 VITALS — BP 120/63 | HR 57 | Temp 98.0°F

## 2014-06-06 DIAGNOSIS — D649 Anemia, unspecified: Secondary | ICD-10-CM

## 2014-06-06 LAB — CBC WITH DIFFERENTIAL/PLATELET
BASO%: 0.5 % (ref 0.0–2.0)
Basophils Absolute: 0 10*3/uL (ref 0.0–0.1)
EOS%: 2.1 % (ref 0.0–7.0)
Eosinophils Absolute: 0.1 10*3/uL (ref 0.0–0.5)
HCT: 32 % — ABNORMAL LOW (ref 34.8–46.6)
HGB: 10.5 g/dL — ABNORMAL LOW (ref 11.6–15.9)
LYMPH%: 34 % (ref 14.0–49.7)
MCH: 28.6 pg (ref 25.1–34.0)
MCHC: 32.8 g/dL (ref 31.5–36.0)
MCV: 87.2 fL (ref 79.5–101.0)
MONO#: 0.4 10*3/uL (ref 0.1–0.9)
MONO%: 7.4 % (ref 0.0–14.0)
NEUT#: 2.9 10*3/uL (ref 1.5–6.5)
NEUT%: 56 % (ref 38.4–76.8)
Platelets: 163 10*3/uL (ref 145–400)
RBC: 3.66 10*6/uL — AB (ref 3.70–5.45)
RDW: 15 % — AB (ref 11.2–14.5)
WBC: 5.1 10*3/uL (ref 3.9–10.3)
lymph#: 1.7 10*3/uL (ref 0.9–3.3)

## 2014-06-06 LAB — TECHNOLOGIST REVIEW

## 2014-06-06 MED ORDER — DARBEPOETIN ALFA-POLYSORBATE 300 MCG/0.6ML IJ SOLN
300.0000 ug | INTRAMUSCULAR | Status: DC
  Administered 2014-06-06: 300 ug via SUBCUTANEOUS
  Filled 2014-06-06: qty 0.6

## 2014-06-18 ENCOUNTER — Encounter: Payer: Self-pay | Admitting: Emergency Medicine

## 2014-06-18 ENCOUNTER — Ambulatory Visit (INDEPENDENT_AMBULATORY_CARE_PROVIDER_SITE_OTHER): Payer: Medicare Other | Admitting: Emergency Medicine

## 2014-06-18 VITALS — BP 126/80 | HR 64 | Temp 98.2°F | Resp 16 | Ht 63.0 in | Wt 179.0 lb

## 2014-06-18 DIAGNOSIS — R05 Cough: Secondary | ICD-10-CM

## 2014-06-18 DIAGNOSIS — R059 Cough, unspecified: Secondary | ICD-10-CM | POA: Diagnosis not present

## 2014-06-18 DIAGNOSIS — J0100 Acute maxillary sinusitis, unspecified: Secondary | ICD-10-CM | POA: Diagnosis not present

## 2014-06-18 MED ORDER — PRAVASTATIN SODIUM 40 MG PO TABS: 40.0000 mg | ORAL_TABLET | Freq: Every day | ORAL | Status: DC

## 2014-06-18 MED ORDER — HYDROCODONE-ACETAMINOPHEN 5-325 MG PO TABS: 1.0000 | ORAL_TABLET | Freq: Four times a day (QID) | ORAL | Status: DC | PRN

## 2014-06-18 MED ORDER — ALBUTEROL SULFATE HFA 108 (90 BASE) MCG/ACT IN AERS: 2.0000 | INHALATION_SPRAY | Freq: Four times a day (QID) | RESPIRATORY_TRACT | Status: DC | PRN

## 2014-06-18 MED ORDER — AZITHROMYCIN 250 MG PO TABS: ORAL_TABLET | ORAL | Status: DC

## 2014-06-18 NOTE — Patient Instructions (Signed)
Sinusitis °Sinusitis is redness, soreness, and puffiness (inflammation) of the air pockets in the bones of your face (sinuses). The redness, soreness, and puffiness can cause air and mucus to get trapped in your sinuses. This can allow germs to grow and cause an infection.  °HOME CARE  °· Drink enough fluids to keep your pee (urine) clear or pale yellow. °· Use a humidifier in your home. °· Run a hot shower to create steam in the bathroom. Sit in the bathroom with the door closed. Breathe in the steam 3-4 times a day. °· Put a warm, moist washcloth on your face 3-4 times a day, or as told by your doctor. °· Use salt water sprays (saline sprays) to wet the thick fluid in your nose. This can help the sinuses drain. °· Only take medicine as told by your doctor. °GET HELP RIGHT AWAY IF:  °· Your pain gets worse. °· You have very bad headaches. °· You are sick to your stomach (nauseous). °· You throw up (vomit). °· You are very sleepy (drowsy) all the time. °· Your face is puffy (swollen). °· Your vision changes. °· You have a stiff neck. °· You have trouble breathing. °MAKE SURE YOU:  °· Understand these instructions. °· Will watch your condition. °· Will get help right away if you are not doing well or get worse. °Document Released: 03/07/2008 Document Revised: 06/13/2012 Document Reviewed: 04/24/2012 °[employer]® Patient Information ©2015 [employer]. This information is not intended to replace advice given to you by your health care provider. Make sure you discuss any questions you have with your health care provider. ° °

## 2014-06-18 NOTE — Progress Notes (Signed)
Subjective:    Patient ID: Susan [name], female    DOB: Sep 07, 1941, 73 y.o.   MRN: [id number]  HPI Comments: 73 yo sinus pressure congestion on/ off x 1 month. She has yellow/ dark production from nose and chest. She has felt more more fatigued and light headed.  She notes right shoulder bone spur pain on/off. She uses occasional vicodin, last RX 03/21/14. She would like refill of RX. She has not bee doing exercises AD.  Sinusitis Associated symptoms include congestion, coughing, headaches and sinus pressure.  Headache  Associated symptoms include coughing and sinus pressure.     Medication List       This list is accurate as of: 06/18/14  9:47 AM.  Always use your most recent med list.               ARANESP IJ  Inject as directed.     aspirin 81 MG chewable tablet  Chew 81 mg by mouth every morning.     diclofenac 75 MG EC tablet  Commonly known as:  VOLTAREN  Take 1 tablet (75 mg total) by mouth 2 (two) times daily.     HYDROcodone-acetaminophen 5-325 MG per tablet  Commonly known as:  NORCO/VICODIN  Take 1 tablet by mouth every 6 (six) hours as needed. For headaches     labetalol 300 MG tablet  Commonly known as:  NORMODYNE  Take 1 tablet (300 mg total) by mouth 2 (two) times daily.     levothyroxine 100 MCG tablet  Commonly known as:  SYNTHROID, LEVOTHROID  Take 100 mcg by mouth daily before breakfast. Takes 100 mcg 6 days a week and none on Mondays     Olmesartan-Amlodipine-HCTZ 40-10-25 MG Tabs  1/2 to 1 po QD     omeprazole 20 MG capsule  Commonly known as:  PRILOSEC  Take 20 mg by mouth every morning.     pravastatin 40 MG tablet  Commonly known as:  PRAVACHOL  Take 40 mg by mouth at bedtime.     Vitamin D-3 5000 UNITS Tabs  Take 10,000 Units by mouth daily.       Allergies  Allergen Reactions  . Codeine Anaphylaxis  . Penicillins Anaphylaxis  . Bactrim Itching and Swelling  . Clarithromycin Other (See Comments)    "Caused skin to peel off  my hand."  . Flagyl [Metronidazole Hcl] Itching and Swelling  . Sulfa Antibiotics Itching and Swelling      Review of Systems  HENT: Positive for congestion and sinus pressure.   Respiratory: Positive for cough.   Musculoskeletal: Positive for arthralgias.  Neurological: Positive for headaches.  All other systems reviewed and are negative.  BP 126/80  Pulse 64  Temp(Src) 98.2 F (36.8 C) (Temporal)  Resp 16  Ht 5\' 3"  (1.6 m)  Wt 179 lb (81.194 kg)  BMI 31.72 kg/m2     Objective:   Physical Exam  Nursing note and vitals reviewed. Constitutional: She is oriented to person, place, and time. She appears well-developed and well-nourished.  HENT:  Head: Normocephalic and atraumatic.  Right Ear: External ear normal.  Left Ear: External ear normal.  Nose: Nose normal.  Yellow TMs bilateral and erythematous  Eyes: Conjunctivae and EOM are normal.  Neck: Normal range of motion.  Cardiovascular: Normal rate, regular rhythm, normal heart sounds and intact distal pulses.   Pulmonary/Chest: Effort normal and breath sounds normal.  Musculoskeletal: She exhibits no edema and no tenderness.  Decreased ROM Right shoulder  Lymphadenopathy:    She has no cervical adenopathy.  Neurological: She is alert and oriented to person, place, and time.  Skin: Skin is warm and dry.  Psychiatric: She has a normal mood and affect. Judgment normal.          Assessment & Plan:  Acute maxillary sinusitis, recurrence not specified - Plan: azithromycin (ZITHROMAX) 250 MG tablet  Cough - Plan: albuterol (PROVENTIL HFA;VENTOLIN HFA) 108 (90 BASE) MCG/ACT inhaler   Advised if shoulder continues to be painful she needs to call ORTHO for  F/u. Use Norco sparingly.

## 2014-06-27 ENCOUNTER — Other Ambulatory Visit (HOSPITAL_BASED_OUTPATIENT_CLINIC_OR_DEPARTMENT_OTHER): Payer: Medicare Other

## 2014-06-27 ENCOUNTER — Ambulatory Visit: Payer: Medicare Other

## 2014-06-27 DIAGNOSIS — D649 Anemia, unspecified: Secondary | ICD-10-CM

## 2014-06-27 LAB — CBC WITH DIFFERENTIAL/PLATELET
BASO%: 0 % (ref 0.0–2.0)
BASOS ABS: 0 10*3/uL (ref 0.0–0.1)
EOS%: 2.7 % (ref 0.0–7.0)
Eosinophils Absolute: 0.1 10*3/uL (ref 0.0–0.5)
HCT: 32.4 % — ABNORMAL LOW (ref 34.8–46.6)
HEMOGLOBIN: 11.1 g/dL — AB (ref 11.6–15.9)
LYMPH%: 41.2 % (ref 14.0–49.7)
MCH: 29.1 pg (ref 25.1–34.0)
MCHC: 34.3 g/dL (ref 31.5–36.0)
MCV: 85 fL (ref 79.5–101.0)
MONO#: 0.4 10*3/uL (ref 0.1–0.9)
MONO%: 10.4 % (ref 0.0–14.0)
NEUT#: 1.7 10*3/uL (ref 1.5–6.5)
NEUT%: 45.7 % (ref 38.4–76.8)
Platelets: 124 10*3/uL — ABNORMAL LOW (ref 145–400)
RBC: 3.81 10*6/uL (ref 3.70–5.45)
RDW: 15.7 % — ABNORMAL HIGH (ref 11.2–14.5)
WBC: 3.6 10*3/uL — ABNORMAL LOW (ref 3.9–10.3)
lymph#: 1.5 10*3/uL (ref 0.9–3.3)
nRBC: 0 % (ref 0–0)

## 2014-06-27 MED ORDER — DARBEPOETIN ALFA-POLYSORBATE 300 MCG/0.6ML IJ SOLN: 300.0000 ug | INTRAMUSCULAR | Status: DC

## 2014-07-14 DIAGNOSIS — M67911 Unspecified disorder of synovium and tendon, right shoulder: Secondary | ICD-10-CM | POA: Diagnosis not present

## 2014-07-18 ENCOUNTER — Other Ambulatory Visit (HOSPITAL_BASED_OUTPATIENT_CLINIC_OR_DEPARTMENT_OTHER): Payer: Medicare Other

## 2014-07-18 ENCOUNTER — Ambulatory Visit (HOSPITAL_BASED_OUTPATIENT_CLINIC_OR_DEPARTMENT_OTHER): Payer: Medicare Other

## 2014-07-18 VITALS — BP 103/57 | HR 64 | Temp 97.8°F

## 2014-07-18 DIAGNOSIS — D649 Anemia, unspecified: Secondary | ICD-10-CM

## 2014-07-18 LAB — CBC WITH DIFFERENTIAL/PLATELET
BASO%: 0.6 % (ref 0.0–2.0)
Basophils Absolute: 0 10*3/uL (ref 0.0–0.1)
EOS%: 2.4 % (ref 0.0–7.0)
Eosinophils Absolute: 0.1 10*3/uL (ref 0.0–0.5)
HCT: 28.7 % — ABNORMAL LOW (ref 34.8–46.6)
HGB: 9.5 g/dL — ABNORMAL LOW (ref 11.6–15.9)
LYMPH%: 49.2 % (ref 14.0–49.7)
MCH: 28.6 pg (ref 25.1–34.0)
MCHC: 33.3 g/dL (ref 31.5–36.0)
MCV: 85.9 fL (ref 79.5–101.0)
MONO#: 0.4 10*3/uL (ref 0.1–0.9)
MONO%: 12.6 % (ref 0.0–14.0)
NEUT#: 1.2 10*3/uL — ABNORMAL LOW (ref 1.5–6.5)
NEUT%: 35.2 % — AB (ref 38.4–76.8)
PLATELETS: 135 10*3/uL — AB (ref 145–400)
RBC: 3.34 10*6/uL — AB (ref 3.70–5.45)
RDW: 15.1 % — ABNORMAL HIGH (ref 11.2–14.5)
WBC: 3.3 10*3/uL — ABNORMAL LOW (ref 3.9–10.3)
lymph#: 1.6 10*3/uL (ref 0.9–3.3)

## 2014-07-18 MED ORDER — DARBEPOETIN ALFA-POLYSORBATE 300 MCG/0.6ML IJ SOLN
300.0000 ug | INTRAMUSCULAR | Status: DC
  Administered 2014-07-18: 300 ug via SUBCUTANEOUS
  Filled 2014-07-18: qty 0.6

## 2014-07-18 NOTE — Patient Instructions (Signed)
Darbepoetin Alfa injection What is this medicine? DARBEPOETIN ALFA (dar be POE e tin AL fa) helps your body make more red blood cells. It is used to treat anemia caused by chronic kidney failure and chemotherapy. This medicine may be used for other purposes; ask your health care provider or pharmacist if you have questions. COMMON BRAND NAME(S): Aranesp What should I tell my health care provider before I take this medicine? They need to know if you have any of these conditions: -blood clotting disorders or history of blood clots -cancer patient not on chemotherapy -cystic fibrosis -heart disease, such as angina, heart failure, or a history of a heart attack -hemoglobin level of 12 g/dL or greater -high blood pressure -low levels of folate, iron, or vitamin B12 -seizures -an unusual or allergic reaction to darbepoetin, erythropoietin, albumin, hamster proteins, latex, other medicines, foods, dyes, or preservatives -pregnant or trying to get pregnant -breast-feeding How should I use this medicine? This medicine is for injection into a vein or under the skin. It is usually given by a health care professional in a hospital or clinic setting. If you get this medicine at home, you will be taught how to prepare and give this medicine. Do not shake the solution before you withdraw a dose. Use exactly as directed. Take your medicine at regular intervals. Do not take your medicine more often than directed. It is important that you put your used needles and syringes in a special sharps container. Do not put them in a trash can. If you do not have a sharps container, call your pharmacist or healthcare provider to get one. Talk to your pediatrician regarding the use of this medicine in children. While this medicine may be used in children as young as 1 year for selected conditions, precautions do apply. Overdosage: If you think you have taken too much of this medicine contact a poison control center or  emergency room at once. NOTE: This medicine is only for you. Do not share this medicine with others. What if I miss a dose? If you miss a dose, take it as soon as you can. If it is almost time for your next dose, take only that dose. Do not take double or extra doses. What may interact with this medicine? Do not take this medicine with any of the following medications: -epoetin alfa This list may not describe all possible interactions. Give your health care provider a list of all the medicines, herbs, non-prescription drugs, or dietary supplements you use. Also tell them if you smoke, drink alcohol, or use illegal drugs. Some items may interact with your medicine. What should I watch for while using this medicine? Visit your prescriber or health care professional for regular checks on your progress and for the needed blood tests and blood pressure measurements. It is especially important for the doctor to make sure your hemoglobin level is in the desired range, to limit the risk of potential side effects and to give you the best benefit. Keep all appointments for any recommended tests. Check your blood pressure as directed. Ask your doctor what your blood pressure should be and when you should contact him or her. As your body makes more red blood cells, you may need to take iron, folic acid, or vitamin B supplements. Ask your doctor or health care provider which products are right for you. If you have kidney disease continue dietary restrictions, even though this medication can make you feel better. Talk with your doctor or health   care professional about the foods you eat and the vitamins that you take. What side effects may I notice from receiving this medicine? Side effects that you should report to your doctor or health care professional as soon as possible: -allergic reactions like skin rash, itching or hives, swelling of the face, lips, or tongue -breathing problems -changes in vision -chest  pain -confusion, trouble speaking or understanding -feeling faint or lightheaded, falls -high blood pressure -muscle aches or pains -pain, swelling, warmth in the leg -rapid weight gain -severe headaches -sudden numbness or weakness of the face, arm or leg -trouble walking, dizziness, loss of balance or coordination -seizures (convulsions) -swelling of the ankles, feet, hands -unusually weak or tired Side effects that usually do not require medical attention (report to your doctor or health care professional if they continue or are bothersome): -diarrhea -fever, chills (flu-like symptoms) -headaches -nausea, vomiting -redness, stinging, or swelling at site where injected This list may not describe all possible side effects. Call your doctor for medical advice about side effects. You may report side effects to FDA at 1-800-FDA-1088. Where should I keep my medicine? Keep out of the reach of children. Store in a refrigerator between 2 and 8 degrees C (36 and 46 degrees F). Do not freeze. Do not shake. Throw away any unused portion if using a single-dose vial. Throw away any unused medicine after the expiration date. NOTE: This sheet is a summary. It may not cover all possible information. If you have questions about this medicine, talk to your doctor, pharmacist, or health care provider.  2015, [employer]. (2008-09-02 10:23:57)  

## 2014-07-23 ENCOUNTER — Other Ambulatory Visit: Payer: Self-pay | Admitting: *Deleted

## 2014-07-23 MED ORDER — LEVOTHYROXINE SODIUM 100 MCG PO TABS: 100.0000 ug | ORAL_TABLET | Freq: Every day | ORAL | Status: DC

## 2014-07-31 ENCOUNTER — Encounter: Payer: Self-pay | Admitting: Internal Medicine

## 2014-07-31 ENCOUNTER — Ambulatory Visit (INDEPENDENT_AMBULATORY_CARE_PROVIDER_SITE_OTHER): Payer: Medicare Other | Admitting: Internal Medicine

## 2014-07-31 VITALS — BP 122/78 | HR 64 | Temp 97.7°F | Resp 16 | Ht 63.0 in | Wt 182.8 lb

## 2014-07-31 DIAGNOSIS — E782 Mixed hyperlipidemia: Secondary | ICD-10-CM | POA: Diagnosis not present

## 2014-07-31 DIAGNOSIS — R7303 Prediabetes: Secondary | ICD-10-CM

## 2014-07-31 DIAGNOSIS — I10 Essential (primary) hypertension: Secondary | ICD-10-CM

## 2014-07-31 DIAGNOSIS — R7309 Other abnormal glucose: Secondary | ICD-10-CM

## 2014-07-31 DIAGNOSIS — E559 Vitamin D deficiency, unspecified: Secondary | ICD-10-CM

## 2014-07-31 DIAGNOSIS — Z79899 Other long term (current) drug therapy: Secondary | ICD-10-CM

## 2014-07-31 LAB — CBC WITH DIFFERENTIAL/PLATELET
BASOS PCT: 0 % (ref 0–1)
Basophils Absolute: 0 10*3/uL (ref 0.0–0.1)
Eosinophils Absolute: 0.1 10*3/uL (ref 0.0–0.7)
Eosinophils Relative: 3 % (ref 0–5)
HCT: 30.5 % — ABNORMAL LOW (ref 36.0–46.0)
Hemoglobin: 10 g/dL — ABNORMAL LOW (ref 12.0–15.0)
LYMPHS PCT: 44 % (ref 12–46)
Lymphs Abs: 1.5 10*3/uL (ref 0.7–4.0)
MCH: 28.5 pg (ref 26.0–34.0)
MCHC: 32.8 g/dL (ref 30.0–36.0)
MCV: 86.9 fL (ref 78.0–100.0)
Monocytes Absolute: 0.3 10*3/uL (ref 0.1–1.0)
Monocytes Relative: 9 % (ref 3–12)
NEUTROS ABS: 1.5 10*3/uL — AB (ref 1.7–7.7)
Neutrophils Relative %: 44 % (ref 43–77)
PLATELETS: 149 10*3/uL — AB (ref 150–400)
RBC: 3.51 MIL/uL — ABNORMAL LOW (ref 3.87–5.11)
RDW: 16.5 % — AB (ref 11.5–15.5)
WBC: 3.5 10*3/uL — AB (ref 4.0–10.5)

## 2014-07-31 LAB — HEMOGLOBIN A1C
Hgb A1c MFr Bld: 4.4 % (ref <5.7)
MEAN PLASMA GLUCOSE: 80 mg/dL (ref <117)

## 2014-07-31 NOTE — Patient Instructions (Signed)

## 2014-07-31 NOTE — Progress Notes (Signed)
Patient ID: Susan [name], female   DOB: April 28, 1941, 73 y.o.   MRN: [id number]   This very nice 73 y.o.WBF presents for 3 month follow up with Hypertension, Hyperlipidemia, Pre-Diabetes and Vitamin D Deficiency.  Patient also has MDS and periodically receives injections of Aranesp.    Patient presents today specifically for PreOp surgical clearance with right shoulder surgery scheduled  For Nov 3rd. Patient has no known heart Dz or sx's or c/o suspect for same. Her HTN is controlled. She denies any recent respiratory sx's as cough, chest congestion or dyspnea.    Patient has been treated for HTN for 30-40 years & BP has been controlled and today's BP: 122/78 mmHg. Patient has had no complaints of any cardiac type chest pain, palpitations, dyspnea/orthopnea/PND, dizziness, claudication, or dependent edema.   Hyperlipidemia is controlled with diet & meds. Patient denies myalgias or other med SE's. Last Lipids were Total Chol 163; HDL 69; LDL 73; Trig 106 on 01/28/2014.   Also, the patient has history of PreDiabetes and has had no symptoms of reactive hypoglycemia, diabetic polys, paresthesias or visual blurring.  Last A1c was  4.7% on  01/28/2014.    Further, the patient also has history of Vitamin D Deficiency (Vit D=27 in 2012) and supplements vitamin D without any suspected side-effects. Last vitamin D was 61 on 01/28/2014.    Medication List   albuterol 108 (90 BASE) MCG/ACT inhaler (uses infrequently)  Commonly known as:  PROVENTIL HFA;VENTOLIN HFA  Inhale 2 puffs into the lungs every 6 (six) hours as needed for wheezing or shortness of breath.     ARANESP IJ  Inject as directed.     aspirin 81 MG chewable tablet  Chew 81 mg by mouth every morning.     diclofenac 75 MG EC tablet  Commonly known as:  VOLTAREN  Take 1 tablet (75 mg total) by mouth 2 (two) times daily.     HYDROcodone-acetaminophen 5-325 MG per tablet  Commonly known as:  NORCO/VICODIN  Take 1 tablet by mouth every 6 (six)  hours as needed. For headaches     labetalol 300 MG tablet  Commonly known as:  NORMODYNE  Take 1 tablet (300 mg total) by mouth 2 (two) times daily.     levothyroxine 100 MCG tablet  Commonly known as:  SYNTHROID, LEVOTHROID  Take 1 tablet (100 mcg total) by mouth daily before breakfast. Takes 100 mcg 6 days a week and none on Mondays     Olmesartan-Amlodipine-HCTZ 40-10-25 MG Tabs  1/2 to 1 po QD     omeprazole 20 MG capsule  Commonly known as:  PRILOSEC  Take 20 mg by mouth every morning.     pravastatin 40 MG tablet  Commonly known as:  PRAVACHOL  Take 1 tablet (40 mg total) by mouth at bedtime.     Vitamin D-3 5000 UNITS Tabs  Take 10,000 Units by mouth daily.     Allergies  Allergen Reactions  . Codeine Anaphylaxis  . Penicillins Anaphylaxis  . Bactrim Itching and Swelling  . Clarithromycin Other (See Comments)    "Caused skin to peel off my hand."  . Flagyl [Metronidazole Hcl] Itching and Swelling  . Sulfa Antibiotics Itching and Swelling   PMHx:   Past Medical History  Diagnosis Date  . Anemia   . Arthritis   . Myelodysplasia   . Hypertension   . HLD (hyperlipidemia)   . Hypothyroidism   . GERD (gastroesophageal reflux disease)   . Vitamin D  deficiency    Immunization History  Administered Date(s) Administered  . Td 10/03/2005   Past Surgical History  Procedure Laterality Date  . Umbilical hernia repair    . Rotator cuff repair Left   . Inguinal hernia repair    . Carpal tunnel release Right   . Dilation and curettage of uterus     FHx:    Reviewed / unchanged  SHx:    Reviewed / unchanged  Systems Review:  Constitutional: Denies fever, chills, wt changes, headaches, insomnia, fatigue, night sweats, change in appetite. Eyes: Denies redness, blurred vision, diplopia, discharge, itchy, watery eyes.  ENT: Denies discharge, congestion, post nasal drip, epistaxis, sore throat, earache, hearing loss, dental pain, tinnitus, vertigo, sinus pain,  snoring.  CV: Denies chest pain, palpitations, irregular heartbeat, syncope, dyspnea, diaphoresis, orthopnea, PND, claudication or edema. Respiratory: denies cough, dyspnea, DOE, pleurisy, hoarseness, laryngitis, wheezing.  Gastrointestinal: Denies dysphagia, odynophagia, heartburn, reflux, water brash, abdominal pain or cramps, nausea, vomiting, bloating, diarrhea, constipation, hematemesis, melena, hematochezia  or hemorrhoids. Genitourinary: Denies dysuria, frequency, urgency, nocturia, hesitancy, discharge, hematuria or flank pain. Musculoskeletal: Has pain limiting ROM of the Right shoulder.  Skin: Denies pruritus, rash, hives, warts, acne, eczema or change in skin lesion(s). Neuro: No weakness, tremor, incoordination, spasms, paresthesia or pain. Psychiatric: Denies confusion, memory loss or sensory loss. Endo: Denies change in weight, skin or hair change.  Heme/Lymph: No excessive bleeding, bruising or enlarged lymph nodes.  Exam:  BP 122/78  Pulse 64  Temp 97.7 F   Resp 16  Ht 5\' 3"    Wt 182 lb 12.8 oz BMI 32.39   Appears well nourished and in no distress. Eyes: PERRLA, EOMs, conjunctiva no swelling or erythema. Sinuses: No frontal/maxillary tenderness ENT/Mouth: EAC's clear, TM's nl w/o erythema, bulging. Nares clear w/o erythema, swelling, exudates. Oropharynx clear without erythema or exudates. Oral hygiene is good. Tongue normal, non obstructing. Hearing intact.  Neck: Supple. Thyroid nl. Car 2+/2+ without bruits, nodes or JVD. Chest: Respirations nl with BS clear & equal w/o rales, rhonchi, wheezing or stridor.  Cor: Heart sounds normal w/ regular rate and rhythm without sig. murmurs, gallops, clicks, or rubs. Peripheral pulses normal and equal  without edema.  Abdomen: Soft & bowel sounds normal. Non-tender w/o guarding, rebound, hernias, masses, or organomegaly.  Lymphatics: Unremarkable.  Musculoskeletal: decreased ROM of right shoulder. Normal gait.  Skin: Warm, dry  without exposed rashes, lesions or ecchymosis apparent.  Neuro: Cranial nerves intact, reflexes equal bilaterally. Sensory-motor testing grossly intact. Tendon reflexes grossly intact.  Pysch: Alert & oriented x 3.  Insight and judgement nl & appropriate. No ideations.  Assessment and Plan:  1. Hypertension - Continue monitor blood pressure at home. Continue diet/meds same.  2. Hyperlipidemia - Continue diet/meds, exercise,& lifestyle modifications. Continue monitor periodic cholesterol/liver & renal functions   3.  Pre-Diabetes - Continue diet, exercise, lifestyle modifications. Monitor appropriate labs.  4. Vitamin D Deficiency - Continue supplementation.   Recommended regular exercise, BP monitoring, weight control, and discussed med and SE's. Recommended labs to assess and monitor clinical status. Further disposition pending results of labs.   Patient is felt stable for planned surgery and felt to have average surgical operative risk. Recommend routine peri/post-op monitoring.

## 2014-08-01 LAB — HEPATIC FUNCTION PANEL
ALK PHOS: 83 U/L (ref 39–117)
ALT: 12 U/L (ref 0–35)
AST: 20 U/L (ref 0–37)
Albumin: 4.5 g/dL (ref 3.5–5.2)
BILIRUBIN DIRECT: 0.1 mg/dL (ref 0.0–0.3)
BILIRUBIN INDIRECT: 0.5 mg/dL (ref 0.2–1.2)
TOTAL PROTEIN: 7.3 g/dL (ref 6.0–8.3)
Total Bilirubin: 0.6 mg/dL (ref 0.2–1.2)

## 2014-08-01 LAB — LIPID PANEL
CHOLESTEROL: 161 mg/dL (ref 0–200)
HDL: 63 mg/dL (ref >39)
LDL CALC: 73 mg/dL (ref 0–99)
TRIGLYCERIDES: 124 mg/dL (ref <150)
Total CHOL/HDL Ratio: 2.6 Ratio
VLDL: 25 mg/dL (ref 0–40)

## 2014-08-01 LAB — BASIC METABOLIC PANEL WITH GFR
BUN: 32 mg/dL — ABNORMAL HIGH (ref 6–23)
CHLORIDE: 107 meq/L (ref 96–112)
CO2: 25 mEq/L (ref 19–32)
Calcium: 9.2 mg/dL (ref 8.4–10.5)
Creat: 1.57 mg/dL — ABNORMAL HIGH (ref 0.50–1.10)
GFR, EST NON AFRICAN AMERICAN: 32 mL/min — AB
GFR, Est African American: 37 mL/min — ABNORMAL LOW
Glucose, Bld: 105 mg/dL — ABNORMAL HIGH (ref 70–99)
POTASSIUM: 4.1 meq/L (ref 3.5–5.3)
SODIUM: 140 meq/L (ref 135–145)

## 2014-08-01 LAB — INSULIN, FASTING: Insulin fasting, serum: 21.5 u[IU]/mL — ABNORMAL HIGH (ref 2.0–19.6)

## 2014-08-01 LAB — MAGNESIUM: Magnesium: 1.9 mg/dL (ref 1.5–2.5)

## 2014-08-01 LAB — TSH: TSH: 7.885 u[IU]/mL — ABNORMAL HIGH (ref 0.350–4.500)

## 2014-08-01 LAB — VITAMIN D 25 HYDROXY (VIT D DEFICIENCY, FRACTURES): Vit D, 25-Hydroxy: 70 ng/mL (ref 30–89)

## 2014-08-05 ENCOUNTER — Ambulatory Visit: Payer: Self-pay | Admitting: Internal Medicine

## 2014-08-05 DIAGNOSIS — Y929 Unspecified place or not applicable: Secondary | ICD-10-CM | POA: Diagnosis not present

## 2014-08-05 DIAGNOSIS — M19012 Primary osteoarthritis, left shoulder: Secondary | ICD-10-CM | POA: Diagnosis not present

## 2014-08-05 DIAGNOSIS — M75121 Complete rotator cuff tear or rupture of right shoulder, not specified as traumatic: Secondary | ICD-10-CM | POA: Diagnosis not present

## 2014-08-05 DIAGNOSIS — S43422A Sprain of left rotator cuff capsule, initial encounter: Secondary | ICD-10-CM | POA: Diagnosis not present

## 2014-08-05 DIAGNOSIS — M19011 Primary osteoarthritis, right shoulder: Secondary | ICD-10-CM | POA: Diagnosis not present

## 2014-08-05 DIAGNOSIS — S43492A Other sprain of left shoulder joint, initial encounter: Secondary | ICD-10-CM | POA: Diagnosis not present

## 2014-08-05 DIAGNOSIS — M7541 Impingement syndrome of right shoulder: Secondary | ICD-10-CM | POA: Diagnosis not present

## 2014-08-05 DIAGNOSIS — X58XXXA Exposure to other specified factors, initial encounter: Secondary | ICD-10-CM | POA: Diagnosis not present

## 2014-08-05 DIAGNOSIS — M7542 Impingement syndrome of left shoulder: Secondary | ICD-10-CM | POA: Diagnosis not present

## 2014-08-05 DIAGNOSIS — G8918 Other acute postprocedural pain: Secondary | ICD-10-CM | POA: Diagnosis not present

## 2014-08-07 ENCOUNTER — Telehealth: Payer: Self-pay | Admitting: Oncology

## 2014-08-07 NOTE — Telephone Encounter (Signed)
Pt called to r/s labs/inj 11/06 due to no transportation and she had surgery can't drive, pt confirmed labs/inj 11/09.[name]... KJ

## 2014-08-08 ENCOUNTER — Ambulatory Visit: Payer: Medicare Other

## 2014-08-08 ENCOUNTER — Other Ambulatory Visit: Payer: Medicare Other

## 2014-08-11 ENCOUNTER — Other Ambulatory Visit (HOSPITAL_BASED_OUTPATIENT_CLINIC_OR_DEPARTMENT_OTHER): Payer: Medicare Other

## 2014-08-11 ENCOUNTER — Ambulatory Visit (HOSPITAL_BASED_OUTPATIENT_CLINIC_OR_DEPARTMENT_OTHER): Payer: Medicare Other

## 2014-08-11 DIAGNOSIS — D649 Anemia, unspecified: Secondary | ICD-10-CM

## 2014-08-11 LAB — CBC WITH DIFFERENTIAL/PLATELET
BASO%: 0.4 % (ref 0.0–2.0)
Basophils Absolute: 0 10*3/uL (ref 0.0–0.1)
EOS ABS: 0.1 10*3/uL (ref 0.0–0.5)
EOS%: 2.6 % (ref 0.0–7.0)
HEMATOCRIT: 28.3 % — AB (ref 34.8–46.6)
HGB: 9.3 g/dL — ABNORMAL LOW (ref 11.6–15.9)
LYMPH%: 42.6 % (ref 14.0–49.7)
MCH: 28.7 pg (ref 25.1–34.0)
MCHC: 33 g/dL (ref 31.5–36.0)
MCV: 87.1 fL (ref 79.5–101.0)
MONO#: 0.4 10*3/uL (ref 0.1–0.9)
MONO%: 10.4 % (ref 0.0–14.0)
NEUT%: 44 % (ref 38.4–76.8)
NEUTROS ABS: 1.7 10*3/uL (ref 1.5–6.5)
PLATELETS: 150 10*3/uL (ref 145–400)
RBC: 3.25 10*6/uL — AB (ref 3.70–5.45)
RDW: 15.5 % — ABNORMAL HIGH (ref 11.2–14.5)
WBC: 4 10*3/uL (ref 3.9–10.3)
lymph#: 1.7 10*3/uL (ref 0.9–3.3)

## 2014-08-11 MED ORDER — DARBEPOETIN ALFA 300 MCG/0.6ML IJ SOSY
300.0000 ug | PREFILLED_SYRINGE | INTRAMUSCULAR | Status: DC
  Administered 2014-08-11: 300 ug via SUBCUTANEOUS
  Filled 2014-08-11: qty 0.6

## 2014-08-11 NOTE — Patient Instructions (Signed)
Darbepoetin Alfa injection What is this medicine? DARBEPOETIN ALFA (dar be POE e tin AL fa) helps your body make more red blood cells. It is used to treat anemia caused by chronic kidney failure and chemotherapy. This medicine may be used for other purposes; ask your health care provider or pharmacist if you have questions. COMMON BRAND NAME(S): Aranesp What should I tell my health care provider before I take this medicine? They need to know if you have any of these conditions: -blood clotting disorders or history of blood clots -cancer patient not on chemotherapy -cystic fibrosis -heart disease, such as angina, heart failure, or a history of a heart attack -hemoglobin level of 12 g/dL or greater -high blood pressure -low levels of folate, iron, or vitamin B12 -seizures -an unusual or allergic reaction to darbepoetin, erythropoietin, albumin, hamster proteins, latex, other medicines, foods, dyes, or preservatives -pregnant or trying to get pregnant -breast-feeding How should I use this medicine? This medicine is for injection into a vein or under the skin. It is usually given by a health care professional in a hospital or clinic setting. If you get this medicine at home, you will be taught how to prepare and give this medicine. Do not shake the solution before you withdraw a dose. Use exactly as directed. Take your medicine at regular intervals. Do not take your medicine more often than directed. It is important that you put your used needles and syringes in a special sharps container. Do not put them in a trash can. If you do not have a sharps container, call your pharmacist or healthcare provider to get one. Talk to your pediatrician regarding the use of this medicine in children. While this medicine may be used in children as young as 1 year for selected conditions, precautions do apply. Overdosage: If you think you have taken too much of this medicine contact a poison control center or  emergency room at once. NOTE: This medicine is only for you. Do not share this medicine with others. What if I miss a dose? If you miss a dose, take it as soon as you can. If it is almost time for your next dose, take only that dose. Do not take double or extra doses. What may interact with this medicine? Do not take this medicine with any of the following medications: -epoetin alfa This list may not describe all possible interactions. Give your health care provider a list of all the medicines, herbs, non-prescription drugs, or dietary supplements you use. Also tell them if you smoke, drink alcohol, or use illegal drugs. Some items may interact with your medicine. What should I watch for while using this medicine? Visit your prescriber or health care professional for regular checks on your progress and for the needed blood tests and blood pressure measurements. It is especially important for the doctor to make sure your hemoglobin level is in the desired range, to limit the risk of potential side effects and to give you the best benefit. Keep all appointments for any recommended tests. Check your blood pressure as directed. Ask your doctor what your blood pressure should be and when you should contact him or her. As your body makes more red blood cells, you may need to take iron, folic acid, or vitamin B supplements. Ask your doctor or health care provider which products are right for you. If you have kidney disease continue dietary restrictions, even though this medication can make you feel better. Talk with your doctor or health   care professional about the foods you eat and the vitamins that you take. What side effects may I notice from receiving this medicine? Side effects that you should report to your doctor or health care professional as soon as possible: -allergic reactions like skin rash, itching or hives, swelling of the face, lips, or tongue -breathing problems -changes in vision -chest  pain -confusion, trouble speaking or understanding -feeling faint or lightheaded, falls -high blood pressure -muscle aches or pains -pain, swelling, warmth in the leg -rapid weight gain -severe headaches -sudden numbness or weakness of the face, arm or leg -trouble walking, dizziness, loss of balance or coordination -seizures (convulsions) -swelling of the ankles, feet, hands -unusually weak or tired Side effects that usually do not require medical attention (report to your doctor or health care professional if they continue or are bothersome): -diarrhea -fever, chills (flu-like symptoms) -headaches -nausea, vomiting -redness, stinging, or swelling at site where injected This list may not describe all possible side effects. Call your doctor for medical advice about side effects. You may report side effects to FDA at 1-800-FDA-1088. Where should I keep my medicine? Keep out of the reach of children. Store in a refrigerator between 2 and 8 degrees C (36 and 46 degrees F). Do not freeze. Do not shake. Throw away any unused portion if using a single-dose vial. Throw away any unused medicine after the expiration date. NOTE: This sheet is a summary. It may not cover all possible information. If you have questions about this medicine, talk to your doctor, pharmacist, or health care provider.  2015, [employer]. (2008-09-02 10:23:57)  

## 2014-08-13 DIAGNOSIS — Z9889 Other specified postprocedural states: Secondary | ICD-10-CM | POA: Diagnosis not present

## 2014-08-13 DIAGNOSIS — Z09 Encounter for follow-up examination after completed treatment for conditions other than malignant neoplasm: Secondary | ICD-10-CM | POA: Diagnosis not present

## 2014-08-29 ENCOUNTER — Other Ambulatory Visit (HOSPITAL_BASED_OUTPATIENT_CLINIC_OR_DEPARTMENT_OTHER): Payer: Medicare Other

## 2014-08-29 ENCOUNTER — Ambulatory Visit (HOSPITAL_BASED_OUTPATIENT_CLINIC_OR_DEPARTMENT_OTHER): Payer: Medicare Other

## 2014-08-29 VITALS — BP 122/77 | HR 61 | Temp 98.2°F | Resp 18

## 2014-08-29 DIAGNOSIS — D649 Anemia, unspecified: Secondary | ICD-10-CM | POA: Diagnosis not present

## 2014-08-29 LAB — CBC WITH DIFFERENTIAL/PLATELET
BASO%: 0.3 % (ref 0.0–2.0)
Basophils Absolute: 0 10*3/uL (ref 0.0–0.1)
EOS%: 2.8 % (ref 0.0–7.0)
Eosinophils Absolute: 0.1 10*3/uL (ref 0.0–0.5)
HEMATOCRIT: 31.5 % — AB (ref 34.8–46.6)
HGB: 10.7 g/dL — ABNORMAL LOW (ref 11.6–15.9)
LYMPH%: 52.1 % — AB (ref 14.0–49.7)
MCH: 29.1 pg (ref 25.1–34.0)
MCHC: 34 g/dL (ref 31.5–36.0)
MCV: 85.6 fL (ref 79.5–101.0)
MONO#: 0.3 10*3/uL (ref 0.1–0.9)
MONO%: 9.2 % (ref 0.0–14.0)
NEUT#: 1.2 10*3/uL — ABNORMAL LOW (ref 1.5–6.5)
NEUT%: 35.6 % — ABNORMAL LOW (ref 38.4–76.8)
PLATELETS: 155 10*3/uL (ref 145–400)
RBC: 3.68 10*6/uL — AB (ref 3.70–5.45)
RDW: 15.6 % — ABNORMAL HIGH (ref 11.2–14.5)
WBC: 3.3 10*3/uL — ABNORMAL LOW (ref 3.9–10.3)
lymph#: 1.7 10*3/uL (ref 0.9–3.3)

## 2014-08-29 MED ORDER — DARBEPOETIN ALFA 300 MCG/0.6ML IJ SOSY
300.0000 ug | PREFILLED_SYRINGE | INTRAMUSCULAR | Status: DC
  Administered 2014-08-29: 300 ug via SUBCUTANEOUS
  Filled 2014-08-29: qty 0.6

## 2014-08-29 NOTE — Patient Instructions (Signed)
Darbepoetin Alfa injection What is this medicine? DARBEPOETIN ALFA (dar be POE e tin AL fa) helps your body make more red blood cells. It is used to treat anemia caused by chronic kidney failure and chemotherapy. This medicine may be used for other purposes; ask your health care provider or pharmacist if you have questions. COMMON BRAND NAME(S): Aranesp What should I tell my health care provider before I take this medicine? They need to know if you have any of these conditions: -blood clotting disorders or history of blood clots -cancer patient not on chemotherapy -cystic fibrosis -heart disease, such as angina, heart failure, or a history of a heart attack -hemoglobin level of 12 g/dL or greater -high blood pressure -low levels of folate, iron, or vitamin B12 -seizures -an unusual or allergic reaction to darbepoetin, erythropoietin, albumin, hamster proteins, latex, other medicines, foods, dyes, or preservatives -pregnant or trying to get pregnant -breast-feeding How should I use this medicine? This medicine is for injection into a vein or under the skin. It is usually given by a health care professional in a hospital or clinic setting. If you get this medicine at home, you will be taught how to prepare and give this medicine. Do not shake the solution before you withdraw a dose. Use exactly as directed. Take your medicine at regular intervals. Do not take your medicine more often than directed. It is important that you put your used needles and syringes in a special sharps container. Do not put them in a trash can. If you do not have a sharps container, call your pharmacist or healthcare provider to get one. Talk to your pediatrician regarding the use of this medicine in children. While this medicine may be used in children as young as 1 year for selected conditions, precautions do apply. Overdosage: If you think you have taken too much of this medicine contact a poison control center or  emergency room at once. NOTE: This medicine is only for you. Do not share this medicine with others. What if I miss a dose? If you miss a dose, take it as soon as you can. If it is almost time for your next dose, take only that dose. Do not take double or extra doses. What may interact with this medicine? Do not take this medicine with any of the following medications: -epoetin alfa This list may not describe all possible interactions. Give your health care provider a list of all the medicines, herbs, non-prescription drugs, or dietary supplements you use. Also tell them if you smoke, drink alcohol, or use illegal drugs. Some items may interact with your medicine. What should I watch for while using this medicine? Visit your prescriber or health care professional for regular checks on your progress and for the needed blood tests and blood pressure measurements. It is especially important for the doctor to make sure your hemoglobin level is in the desired range, to limit the risk of potential side effects and to give you the best benefit. Keep all appointments for any recommended tests. Check your blood pressure as directed. Ask your doctor what your blood pressure should be and when you should contact him or her. As your body makes more red blood cells, you may need to take iron, folic acid, or vitamin B supplements. Ask your doctor or health care provider which products are right for you. If you have kidney disease continue dietary restrictions, even though this medication can make you feel better. Talk with your doctor or health   care professional about the foods you eat and the vitamins that you take. What side effects may I notice from receiving this medicine? Side effects that you should report to your doctor or health care professional as soon as possible: -allergic reactions like skin rash, itching or hives, swelling of the face, lips, or tongue -breathing problems -changes in vision -chest  pain -confusion, trouble speaking or understanding -feeling faint or lightheaded, falls -high blood pressure -muscle aches or pains -pain, swelling, warmth in the leg -rapid weight gain -severe headaches -sudden numbness or weakness of the face, arm or leg -trouble walking, dizziness, loss of balance or coordination -seizures (convulsions) -swelling of the ankles, feet, hands -unusually weak or tired Side effects that usually do not require medical attention (report to your doctor or health care professional if they continue or are bothersome): -diarrhea -fever, chills (flu-like symptoms) -headaches -nausea, vomiting -redness, stinging, or swelling at site where injected This list may not describe all possible side effects. Call your doctor for medical advice about side effects. You may report side effects to FDA at 1-800-FDA-1088. Where should I keep my medicine? Keep out of the reach of children. Store in a refrigerator between 2 and 8 degrees C (36 and 46 degrees F). Do not freeze. Do not shake. Throw away any unused portion if using a single-dose vial. Throw away any unused medicine after the expiration date. NOTE: This sheet is a summary. It may not cover all possible information. If you have questions about this medicine, talk to your doctor, pharmacist, or health care provider.  2015, [employer]. (2008-09-02 10:23:57)  

## 2014-09-03 ENCOUNTER — Other Ambulatory Visit: Payer: Self-pay

## 2014-09-03 ENCOUNTER — Other Ambulatory Visit: Payer: Self-pay | Admitting: Internal Medicine

## 2014-09-03 MED ORDER — LEVOTHYROXINE SODIUM 100 MCG PO TABS: ORAL_TABLET | ORAL | Status: DC

## 2014-09-03 MED ORDER — LEVOTHYROXINE SODIUM 100 MCG PO TABS: 100.0000 ug | ORAL_TABLET | Freq: Every day | ORAL | Status: DC

## 2014-09-18 DIAGNOSIS — M25511 Pain in right shoulder: Secondary | ICD-10-CM | POA: Diagnosis not present

## 2014-09-18 DIAGNOSIS — M75101 Unspecified rotator cuff tear or rupture of right shoulder, not specified as traumatic: Secondary | ICD-10-CM | POA: Diagnosis not present

## 2014-09-18 DIAGNOSIS — M25611 Stiffness of right shoulder, not elsewhere classified: Secondary | ICD-10-CM | POA: Diagnosis not present

## 2014-09-19 ENCOUNTER — Ambulatory Visit: Payer: Medicare Other

## 2014-09-19 ENCOUNTER — Other Ambulatory Visit: Payer: Self-pay | Admitting: *Deleted

## 2014-09-19 ENCOUNTER — Other Ambulatory Visit (HOSPITAL_BASED_OUTPATIENT_CLINIC_OR_DEPARTMENT_OTHER): Payer: Medicare Other

## 2014-09-19 DIAGNOSIS — D649 Anemia, unspecified: Secondary | ICD-10-CM | POA: Diagnosis not present

## 2014-09-19 LAB — CBC WITH DIFFERENTIAL/PLATELET
BASO%: 0.3 % (ref 0.0–2.0)
Basophils Absolute: 0 10*3/uL (ref 0.0–0.1)
EOS%: 2.5 % (ref 0.0–7.0)
Eosinophils Absolute: 0.1 10*3/uL (ref 0.0–0.5)
HCT: 36 % (ref 34.8–46.6)
HGB: 12.1 g/dL (ref 11.6–15.9)
LYMPH%: 50.8 % — ABNORMAL HIGH (ref 14.0–49.7)
MCH: 28.4 pg (ref 25.1–34.0)
MCHC: 33.6 g/dL (ref 31.5–36.0)
MCV: 84.5 fL (ref 79.5–101.0)
MONO#: 0.3 10*3/uL (ref 0.1–0.9)
MONO%: 8.2 % (ref 0.0–14.0)
NEUT#: 1.4 10*3/uL — ABNORMAL LOW (ref 1.5–6.5)
NEUT%: 38.2 % — ABNORMAL LOW (ref 38.4–76.8)
Platelets: 136 10*3/uL — ABNORMAL LOW (ref 145–400)
RBC: 4.26 10*6/uL (ref 3.70–5.45)
RDW: 15.8 % — ABNORMAL HIGH (ref 11.2–14.5)
WBC: 3.7 10*3/uL — ABNORMAL LOW (ref 3.9–10.3)
lymph#: 1.9 10*3/uL (ref 0.9–3.3)
nRBC: 0 % (ref 0–0)

## 2014-09-19 MED ORDER — DICLOFENAC SODIUM 75 MG PO TBEC: 75.0000 mg | DELAYED_RELEASE_TABLET | Freq: Two times a day (BID) | ORAL | Status: DC

## 2014-09-19 NOTE — Progress Notes (Signed)
Pt entered clinic for labs and possible injection. Lab showed Hgb of 12.1. Injection not given  Due to parameter of Arensp. Pt instructed to keep future appoints and to call clinic for any blood issues. Pt verbalized understanding

## 2014-09-21 ENCOUNTER — Other Ambulatory Visit: Payer: Self-pay | Admitting: Internal Medicine

## 2014-09-24 DIAGNOSIS — M75101 Unspecified rotator cuff tear or rupture of right shoulder, not specified as traumatic: Secondary | ICD-10-CM | POA: Diagnosis not present

## 2014-09-24 DIAGNOSIS — M25611 Stiffness of right shoulder, not elsewhere classified: Secondary | ICD-10-CM | POA: Diagnosis not present

## 2014-09-24 DIAGNOSIS — M25511 Pain in right shoulder: Secondary | ICD-10-CM | POA: Diagnosis not present

## 2014-09-29 DIAGNOSIS — M75101 Unspecified rotator cuff tear or rupture of right shoulder, not specified as traumatic: Secondary | ICD-10-CM | POA: Diagnosis not present

## 2014-09-29 DIAGNOSIS — M25511 Pain in right shoulder: Secondary | ICD-10-CM | POA: Diagnosis not present

## 2014-09-29 DIAGNOSIS — M25611 Stiffness of right shoulder, not elsewhere classified: Secondary | ICD-10-CM | POA: Diagnosis not present

## 2014-10-01 DIAGNOSIS — M75101 Unspecified rotator cuff tear or rupture of right shoulder, not specified as traumatic: Secondary | ICD-10-CM | POA: Diagnosis not present

## 2014-10-01 DIAGNOSIS — M25611 Stiffness of right shoulder, not elsewhere classified: Secondary | ICD-10-CM | POA: Diagnosis not present

## 2014-10-01 DIAGNOSIS — M25511 Pain in right shoulder: Secondary | ICD-10-CM | POA: Diagnosis not present

## 2014-10-09 DIAGNOSIS — M75101 Unspecified rotator cuff tear or rupture of right shoulder, not specified as traumatic: Secondary | ICD-10-CM | POA: Diagnosis not present

## 2014-10-09 DIAGNOSIS — M25511 Pain in right shoulder: Secondary | ICD-10-CM | POA: Diagnosis not present

## 2014-10-09 DIAGNOSIS — M25611 Stiffness of right shoulder, not elsewhere classified: Secondary | ICD-10-CM | POA: Diagnosis not present

## 2014-10-10 ENCOUNTER — Ambulatory Visit (HOSPITAL_BASED_OUTPATIENT_CLINIC_OR_DEPARTMENT_OTHER): Payer: Medicare Other

## 2014-10-10 ENCOUNTER — Other Ambulatory Visit (HOSPITAL_BASED_OUTPATIENT_CLINIC_OR_DEPARTMENT_OTHER): Payer: Medicare Other

## 2014-10-10 DIAGNOSIS — D649 Anemia, unspecified: Secondary | ICD-10-CM | POA: Diagnosis not present

## 2014-10-10 LAB — CBC WITH DIFFERENTIAL/PLATELET
BASO%: 0 % (ref 0.0–2.0)
BASOS ABS: 0 10*3/uL (ref 0.0–0.1)
EOS%: 2.6 % (ref 0.0–7.0)
Eosinophils Absolute: 0.1 10*3/uL (ref 0.0–0.5)
HEMATOCRIT: 29 % — AB (ref 34.8–46.6)
HGB: 9.7 g/dL — ABNORMAL LOW (ref 11.6–15.9)
LYMPH#: 1.7 10*3/uL (ref 0.9–3.3)
LYMPH%: 44.6 % (ref 14.0–49.7)
MCH: 27.8 pg (ref 25.1–34.0)
MCHC: 33.4 g/dL (ref 31.5–36.0)
MCV: 83.1 fL (ref 79.5–101.0)
MONO#: 0.3 10*3/uL (ref 0.1–0.9)
MONO%: 8 % (ref 0.0–14.0)
NEUT%: 44.8 % (ref 38.4–76.8)
NEUTROS ABS: 1.7 10*3/uL (ref 1.5–6.5)
PLATELETS: 145 10*3/uL (ref 145–400)
RBC: 3.49 10*6/uL — ABNORMAL LOW (ref 3.70–5.45)
RDW: 15.5 % — ABNORMAL HIGH (ref 11.2–14.5)
WBC: 3.9 10*3/uL (ref 3.9–10.3)

## 2014-10-10 MED ORDER — DARBEPOETIN ALFA 300 MCG/0.6ML IJ SOSY
300.0000 ug | PREFILLED_SYRINGE | INTRAMUSCULAR | Status: DC
  Administered 2014-10-10: 300 ug via SUBCUTANEOUS
  Filled 2014-10-10: qty 0.6

## 2014-10-10 NOTE — Patient Instructions (Signed)
Darbepoetin Alfa injection What is this medicine? DARBEPOETIN ALFA (dar be POE e tin AL fa) helps your body make more red blood cells. It is used to treat anemia caused by chronic kidney failure and chemotherapy. This medicine may be used for other purposes; ask your health care provider or pharmacist if you have questions. COMMON BRAND NAME(S): Aranesp What should I tell my health care provider before I take this medicine? They need to know if you have any of these conditions: -blood clotting disorders or history of blood clots -cancer patient not on chemotherapy -cystic fibrosis -heart disease, such as angina, heart failure, or a history of a heart attack -hemoglobin level of 12 g/dL or greater -high blood pressure -low levels of folate, iron, or vitamin B12 -seizures -an unusual or allergic reaction to darbepoetin, erythropoietin, albumin, hamster proteins, latex, other medicines, foods, dyes, or preservatives -pregnant or trying to get pregnant -breast-feeding How should I use this medicine? This medicine is for injection into a vein or under the skin. It is usually given by a health care professional in a hospital or clinic setting. If you get this medicine at home, you will be taught how to prepare and give this medicine. Do not shake the solution before you withdraw a dose. Use exactly as directed. Take your medicine at regular intervals. Do not take your medicine more often than directed. It is important that you put your used needles and syringes in a special sharps container. Do not put them in a trash can. If you do not have a sharps container, call your pharmacist or healthcare provider to get one. Talk to your pediatrician regarding the use of this medicine in children. While this medicine may be used in children as young as 1 year for selected conditions, precautions do apply. Overdosage: If you think you have taken too much of this medicine contact a poison control center or  emergency room at once. NOTE: This medicine is only for you. Do not share this medicine with others. What if I miss a dose? If you miss a dose, take it as soon as you can. If it is almost time for your next dose, take only that dose. Do not take double or extra doses. What may interact with this medicine? Do not take this medicine with any of the following medications: -epoetin alfa This list may not describe all possible interactions. Give your health care provider a list of all the medicines, herbs, non-prescription drugs, or dietary supplements you use. Also tell them if you smoke, drink alcohol, or use illegal drugs. Some items may interact with your medicine. What should I watch for while using this medicine? Visit your prescriber or health care professional for regular checks on your progress and for the needed blood tests and blood pressure measurements. It is especially important for the doctor to make sure your hemoglobin level is in the desired range, to limit the risk of potential side effects and to give you the best benefit. Keep all appointments for any recommended tests. Check your blood pressure as directed. Ask your doctor what your blood pressure should be and when you should contact him or her. As your body makes more red blood cells, you may need to take iron, folic acid, or vitamin B supplements. Ask your doctor or health care provider which products are right for you. If you have kidney disease continue dietary restrictions, even though this medication can make you feel better. Talk with your doctor or health   care professional about the foods you eat and the vitamins that you take. What side effects may I notice from receiving this medicine? Side effects that you should report to your doctor or health care professional as soon as possible: -allergic reactions like skin rash, itching or hives, swelling of the face, lips, or tongue -breathing problems -changes in vision -chest  pain -confusion, trouble speaking or understanding -feeling faint or lightheaded, falls -high blood pressure -muscle aches or pains -pain, swelling, warmth in the leg -rapid weight gain -severe headaches -sudden numbness or weakness of the face, arm or leg -trouble walking, dizziness, loss of balance or coordination -seizures (convulsions) -swelling of the ankles, feet, hands -unusually weak or tired Side effects that usually do not require medical attention (report to your doctor or health care professional if they continue or are bothersome): -diarrhea -fever, chills (flu-like symptoms) -headaches -nausea, vomiting -redness, stinging, or swelling at site where injected This list may not describe all possible side effects. Call your doctor for medical advice about side effects. You may report side effects to FDA at 1-800-FDA-1088. Where should I keep my medicine? Keep out of the reach of children. Store in a refrigerator between 2 and 8 degrees C (36 and 46 degrees F). Do not freeze. Do not shake. Throw away any unused portion if using a single-dose vial. Throw away any unused medicine after the expiration date. NOTE: This sheet is a summary. It may not cover all possible information. If you have questions about this medicine, talk to your doctor, pharmacist, or health care provider.  2015, [employer]. (2008-09-02 10:23:57)  

## 2014-10-13 DIAGNOSIS — M75101 Unspecified rotator cuff tear or rupture of right shoulder, not specified as traumatic: Secondary | ICD-10-CM | POA: Diagnosis not present

## 2014-10-13 DIAGNOSIS — M25511 Pain in right shoulder: Secondary | ICD-10-CM | POA: Diagnosis not present

## 2014-10-13 DIAGNOSIS — M25611 Stiffness of right shoulder, not elsewhere classified: Secondary | ICD-10-CM | POA: Diagnosis not present

## 2014-10-19 ENCOUNTER — Encounter (HOSPITAL_COMMUNITY): Payer: Self-pay | Admitting: Emergency Medicine

## 2014-10-19 ENCOUNTER — Emergency Department (HOSPITAL_COMMUNITY)
Admission: EM | Admit: 2014-10-19 | Discharge: 2014-10-19 | Disposition: A | Discharge status: Home / Self Care | Payer: Medicare Other | Attending: Emergency Medicine | Admitting: Emergency Medicine

## 2014-10-19 DIAGNOSIS — I10 Essential (primary) hypertension: Secondary | ICD-10-CM | POA: Diagnosis not present

## 2014-10-19 DIAGNOSIS — Z87891 Personal history of nicotine dependence: Secondary | ICD-10-CM | POA: Insufficient documentation

## 2014-10-19 DIAGNOSIS — M1990 Unspecified osteoarthritis, unspecified site: Secondary | ICD-10-CM | POA: Diagnosis not present

## 2014-10-19 DIAGNOSIS — R109 Unspecified abdominal pain: Secondary | ICD-10-CM | POA: Insufficient documentation

## 2014-10-19 DIAGNOSIS — Z7982 Long term (current) use of aspirin: Secondary | ICD-10-CM | POA: Insufficient documentation

## 2014-10-19 DIAGNOSIS — Z862 Personal history of diseases of the blood and blood-forming organs and certain disorders involving the immune mechanism: Secondary | ICD-10-CM | POA: Insufficient documentation

## 2014-10-19 DIAGNOSIS — Z880 Allergy status to penicillin: Secondary | ICD-10-CM | POA: Insufficient documentation

## 2014-10-19 DIAGNOSIS — Z791 Long term (current) use of non-steroidal anti-inflammatories (NSAID): Secondary | ICD-10-CM | POA: Diagnosis not present

## 2014-10-19 DIAGNOSIS — E559 Vitamin D deficiency, unspecified: Secondary | ICD-10-CM | POA: Diagnosis not present

## 2014-10-19 DIAGNOSIS — M791 Myalgia, unspecified site: Secondary | ICD-10-CM

## 2014-10-19 DIAGNOSIS — K219 Gastro-esophageal reflux disease without esophagitis: Secondary | ICD-10-CM | POA: Diagnosis not present

## 2014-10-19 DIAGNOSIS — Z79899 Other long term (current) drug therapy: Secondary | ICD-10-CM | POA: Diagnosis not present

## 2014-10-19 DIAGNOSIS — E785 Hyperlipidemia, unspecified: Secondary | ICD-10-CM | POA: Insufficient documentation

## 2014-10-19 DIAGNOSIS — Q069 Congenital malformation of spinal cord, unspecified: Secondary | ICD-10-CM | POA: Insufficient documentation

## 2014-10-19 DIAGNOSIS — E039 Hypothyroidism, unspecified: Secondary | ICD-10-CM | POA: Insufficient documentation

## 2014-10-19 DIAGNOSIS — R51 Headache: Secondary | ICD-10-CM | POA: Insufficient documentation

## 2014-10-19 LAB — CBC WITH DIFFERENTIAL/PLATELET
BASOS ABS: 0 10*3/uL (ref 0.0–0.1)
BASOS PCT: 0 % (ref 0–1)
Eosinophils Absolute: 0.1 10*3/uL (ref 0.0–0.7)
Eosinophils Relative: 2 % (ref 0–5)
HCT: 30 % — ABNORMAL LOW (ref 36.0–46.0)
Hemoglobin: 9.9 g/dL — ABNORMAL LOW (ref 12.0–15.0)
Lymphocytes Relative: 47 % — ABNORMAL HIGH (ref 12–46)
Lymphs Abs: 1.4 10*3/uL (ref 0.7–4.0)
MCH: 27.7 pg (ref 26.0–34.0)
MCHC: 33 g/dL (ref 30.0–36.0)
MCV: 83.8 fL (ref 78.0–100.0)
MONOS PCT: 10 % (ref 3–12)
Monocytes Absolute: 0.3 10*3/uL (ref 0.1–1.0)
NEUTROS PCT: 41 % — AB (ref 43–77)
Neutro Abs: 1.3 10*3/uL — ABNORMAL LOW (ref 1.7–7.7)
Platelets: 155 10*3/uL (ref 150–400)
RBC: 3.58 MIL/uL — ABNORMAL LOW (ref 3.87–5.11)
RDW: 16.9 % — ABNORMAL HIGH (ref 11.5–15.5)
WBC: 3.1 10*3/uL — AB (ref 4.0–10.5)

## 2014-10-19 LAB — I-STAT CHEM 8, ED
BUN: 15 mg/dL (ref 6–23)
Calcium, Ion: 1.12 mmol/L — ABNORMAL LOW (ref 1.13–1.30)
Chloride: 106 mEq/L (ref 96–112)
Creatinine, Ser: 1.4 mg/dL — ABNORMAL HIGH (ref 0.50–1.10)
Glucose, Bld: 92 mg/dL (ref 70–99)
HCT: 33 % — ABNORMAL LOW (ref 36.0–46.0)
Hemoglobin: 11.2 g/dL — ABNORMAL LOW (ref 12.0–15.0)
Potassium: 3.5 mmol/L (ref 3.5–5.1)
SODIUM: 143 mmol/L (ref 135–145)
TCO2: 20 mmol/L (ref 0–100)

## 2014-10-19 LAB — URINALYSIS, ROUTINE W REFLEX MICROSCOPIC
Bilirubin Urine: NEGATIVE
GLUCOSE, UA: NEGATIVE mg/dL
Hgb urine dipstick: NEGATIVE
KETONES UR: NEGATIVE mg/dL
LEUKOCYTES UA: NEGATIVE
NITRITE: NEGATIVE
PH: 6 (ref 5.0–8.0)
PROTEIN: NEGATIVE mg/dL
SPECIFIC GRAVITY, URINE: 1.009 (ref 1.005–1.030)
Urobilinogen, UA: 1 mg/dL (ref 0.0–1.0)

## 2014-10-19 LAB — BRAIN NATRIURETIC PEPTIDE: B Natriuretic Peptide: 164.8 pg/mL — ABNORMAL HIGH (ref 0.0–100.0)

## 2014-10-19 NOTE — ED Provider Notes (Signed)
CSN: [id number]     Arrival date & time 10/19/14  [id number] History   First MD Initiated Contact with Patient 10/19/14 [phone]     Chief Complaint  Patient presents with  . Hypertension  . Flank Pain  . Headache     (Consider location/radiation/quality/duration/timing/severity/associated sxs/prior Treatment) HPI Comments: Pt comes in with cc of headache x 3 weeks, L flank pain and elevated BP. With the headaches, there is no nausea, vomiting, visual complains, seizures, altered mental status, loss of consciousness, new weakness, or numbness, no gait instability. PT has no visual complains. BP has been staying 160-200 SBP, pt is taking the meds as prescribed. Reports that she was on some sample BP meds, now out. Pt also has L flank pain x 2 days. She has some polyuria and dark urine. There is no dysuria, fevers, chills. No hx of renal stones. Pt's pain is worse with movement.  Pt also is out of her meds.   Patient is a 74 y.o. female presenting with hypertension, flank pain, and headaches. The history is provided by the patient.  Hypertension Associated symptoms include headaches. Pertinent negatives include no chest pain, no abdominal pain and no shortness of breath.  Flank Pain Associated symptoms include headaches. Pertinent negatives include no chest pain, no abdominal pain and no shortness of breath.  Headache Associated symptoms: no abdominal pain, no cough, no diarrhea, no fever, no nausea, no neck pain and no vomiting     Past Medical History  Diagnosis Date  . Anemia   . Arthritis   . Myelodysplasia   . Hypertension   . HLD (hyperlipidemia)   . Hypothyroidism   . GERD (gastroesophageal reflux disease)   . Vitamin D deficiency    Past Surgical History  Procedure Laterality Date  . Umbilical hernia repair    . Rotator cuff repair Left   . Inguinal hernia repair    . Carpal tunnel release Right   . Dilation and curettage of uterus     Family History  Problem Relation Age  of Onset  . Stroke Father   . Hypertension Father   . Stroke Sister   . Coronary artery disease Brother   . Hypertension Sister   . Bladder Cancer Sister   . Cancer Mother    History  Substance Use Topics  . Smoking status: Former Smoker    Types: Cigarettes    Quit date: 10/02/1996  . Smokeless tobacco: Never Used  . Alcohol Use: 0.0 oz/week     Comment: glass of wine at least once a month.   OB History    No data available     Review of Systems  Constitutional: Negative for fever and activity change.  HENT: Negative for facial swelling.   Eyes: Negative for visual disturbance.  Respiratory: Negative for cough, shortness of breath and wheezing.   Cardiovascular: Negative for chest pain.  Gastrointestinal: Negative for nausea, vomiting, abdominal pain, diarrhea, constipation, blood in stool and abdominal distention.  Endocrine: Positive for polyuria.  Genitourinary: Positive for flank pain. Negative for hematuria and difficulty urinating.  Musculoskeletal: Negative for neck pain.  Skin: Negative for color change.  Neurological: Positive for headaches. Negative for speech difficulty.  Hematological: Does not bruise/bleed easily.  Psychiatric/Behavioral: Negative for confusion.      Allergies  Codeine; Penicillins; Bactrim; Clarithromycin; Flagyl; and Sulfa antibiotics  Home Medications   Prior to Admission medications   Medication Sig Start Date End Date Taking? Authorizing Provider  aspirin 81 MG chewable  tablet Chew 81 mg by mouth every morning.    Yes Historical Provider, MD  Cholecalciferol (VITAMIN D-3) 5000 UNITS TABS Take 10,000 Units by mouth 2 (two) times daily.    Yes Historical Provider, MD  Darbepoetin Alfa-Albumin (ARANESP IJ) Inject as directed every 21 ( twenty-one) days.    Yes Historical Provider, MD  HYDROcodone-acetaminophen (NORCO/VICODIN) 5-325 MG per tablet Take 1 tablet by mouth every 6 (six) hours as needed. For headaches 06/18/14  Yes Melissa R  Smith, PA-C  azithromycin (ZITHROMAX) 250 MG tablet Take 2 tablets PO on day 1, then 1 tablet PO Q24H x 4 days 10/21/14 10/25/14  Anderson Malta L Couillard, PA-C  diclofenac (VOLTAREN) 75 MG EC tablet Take 1 tablet (75 mg total) by mouth 2 (two) times daily. 10/21/14   Jennifer L Couillard, PA-C  labetalol (NORMODYNE) 300 MG tablet Take 1 tablet (300 mg total) by mouth 2 (two) times daily. 10/21/14   Jennifer L Couillard, PA-C  levothyroxine (SYNTHROID, LEVOTHROID) 100 MCG tablet Take 1 tablet (100 mcg total) by mouth as directed. 10/21/14   Stephani Police Couillard, PA-C  Olmesartan-Amlodipine-HCTZ 40-10-25 MG TABS 1/2 tablet po QD 10/21/14   Jennifer L Couillard, PA-C  pantoprazole (PROTONIX) 40 MG tablet TAKE ONE TABLET BY MOUTH ONCE DAILY FOR ACID REFLUX 09/21/14   Unk Pinto, MD  pravastatin (PRAVACHOL) 40 MG tablet Take 1 tablet (40 mg total) by mouth at bedtime. 10/21/14   Jennifer L Couillard, PA-C   BP 174/81 mmHg  Pulse 61  Temp(Src) 98 F (36.7 C) (Oral)  Resp 20  SpO2 100% Physical Exam  Constitutional: She is oriented to person, place, and time. She appears well-developed and well-nourished.  HENT:  Head: Normocephalic and atraumatic.  Eyes: EOM are normal. Pupils are equal, round, and reactive to light.  Neck: Neck supple.  Cardiovascular: Normal rate, regular rhythm and normal heart sounds.   No murmur heard. Pulmonary/Chest: Effort normal. No respiratory distress.  Abdominal: Soft. She exhibits no distension. There is no tenderness. There is no rebound and no guarding.  Genitourinary:  L flank tenderness is reproducible  Neurological: She is alert and oriented to person, place, and time. No cranial nerve deficit. Coordination normal.  Cerebellar exam is normal (finger to nose) Sensory exam normal for bilateral upper and lower extremities - and patient is able to discriminate between sharp and dull. Motor exam is 4+/5   Skin: Skin is warm and dry.  Nursing note and vitals  reviewed.   ED Course  Procedures (including critical care time) Labs Review Labs Reviewed  URINALYSIS, ROUTINE W REFLEX MICROSCOPIC - Abnormal; Notable for the following:    APPearance CLOUDY (*)    All other components within normal limits  CBC WITH DIFFERENTIAL - Abnormal; Notable for the following:    WBC 3.1 (*)    RBC 3.58 (*)    Hemoglobin 9.9 (*)    HCT 30.0 (*)    RDW 16.9 (*)    Neutrophils Relative % 41 (*)    Neutro Abs 1.3 (*)    Lymphocytes Relative 47 (*)    All other components within normal limits  BRAIN NATRIURETIC PEPTIDE - Abnormal; Notable for the following:    B Natriuretic Peptide 164.8 (*)    All other components within normal limits  I-STAT CHEM 8, ED - Abnormal; Notable for the following:    Creatinine, Ser 1.40 (*)    Calcium, Ion 1.12 (*)    Hemoglobin 11.2 (*)    HCT 33.0 (*)  All other components within normal limits  URINE CULTURE    Imaging Review No results found.   EKG Interpretation None      MDM   Final diagnoses:  Acute flank pain  Muscular pain  Essential hypertension    Pt comes in with cc of headache and L flank pain. BP is slightly higher, but not enough for EDP to intervene. She reports being on some sample meds from her pcp that she has run out of. She is not sure what the meds were, and i cant find the name of the sample meds in her records - i have messaged pcp to hopefully see pt soon for her BP management. Pt has some polyuria, but no other sx. She is not immunocompromised. Her UA really doesn't show any signs of infection, and it is a clean specimen. Moreover, the flank pain is reproducible and worse with movement. I am hard pressed to dx her a uti/pyelonephritis. We will send the urine for cultures to be more definitive with the rule out. PT has no systemic complains otherwise, and the headache is mild when i saw her. The headache is not positional, it is not worse at night and there is no nausea or ant neuro  complains associated with the headaches. Tumors possible at her age, but given the reliable follow up and reliable attitude from the patient, we have decided that pt will inform her PCP if the headaches are not getting better, and further workup, or tx can be undertaken. Stable for d/c.  Varney Biles, MD 10/21/14 [phone]

## 2014-10-19 NOTE — ED Notes (Signed)
Pt states that she has head pressure that has been going on over the last 3 weeks with her sinuses.  Pt states that she has left flank pain that has been going on for past couple of days.  Pt states that she has been monitoring her BP at home and has been running high.  Pt states that she has been out of one of her meds and cant get it filled until she sees her doctor next week.

## 2014-10-19 NOTE — Discharge Instructions (Signed)
We saw you in the ER for the back pain and elevated BP, headache. All the results in the ER are normal, labs and imaging. We are not sure what is causing your symptoms. The workup in the ER is not complete, and is limited to screening for life threatening and emergent conditions only, so please see a primary care doctor for further evaluation.    Flank Pain Flank pain is pain in your side. The flank is the area of your side between your upper belly (abdomen) and your back. Pain in this area can be caused by many different things. [place] care and treatment will depend on the cause of your pain.  Rest as told by your doctor.  Drink enough fluids to keep your pee (urine) clear or pale yellow.  Only take medicine as told by your doctor.  Tell your doctor about any changes in your pain.  Follow up with your doctor. GET HELP RIGHT AWAY IF:   Your pain does not get better with medicine.   You have new symptoms or your symptoms get worse.  Your pain gets worse.   You have belly (abdominal) pain.   You are short of breath.   You always feel sick to your stomach (nauseous).   You keep throwing up (vomiting).   You have puffiness (swelling) in your belly.   You feel light-headed or you pass out (faint).   You have blood in your pee.  You have a fever or lasting symptoms for more than 2-3 days.  You have a fever and your symptoms suddenly get worse. MAKE SURE YOU:   Understand these instructions.  Will watch your condition.  Will get help right away if you are not doing well or get worse. Document Released: 06/28/2008 Document Revised: 02/03/2014 Document Reviewed: 05/03/2012 [facility] Patient Information 2015 [place], [place]. This information is not intended to replace advice given to you by your health care provider. Make sure you discuss any questions you have with your health care provider.  Headaches, Frequently Asked Questions MIGRAINE HEADACHES Q:  What is migraine? What causes it? How can I treat it? A: Generally, migraine headaches begin as a dull ache. Then they develop into a constant, throbbing, and pulsating pain. You may experience pain at the temples. You may experience pain at the front or back of one or both sides of the head. The pain is usually accompanied by a combination of:  Nausea.  Vomiting.  Sensitivity to light and noise. Some people (about 15%) experience an aura (see below) before an attack. The cause of migraine is believed to be chemical reactions in the brain. Treatment for migraine may include over-the-counter or prescription medications. It may also include self-help techniques. These include relaxation training and biofeedback.  Q: What is an aura? A: About 15% of people with migraine get an "aura". This is a sign of neurological symptoms that occur before a migraine headache. You may see wavy or jagged lines, dots, or flashing lights. You might experience tunnel vision or blind spots in one or both eyes. The aura can include visual or auditory hallucinations (something imagined). It may include disruptions in smell (such as strange odors), taste or touch. Other symptoms include:  Numbness.  A "pins and needles" sensation.  Difficulty in recalling or speaking the correct word. These neurological events may last as long as 60 minutes. These symptoms will fade as the headache begins. Q: What is a trigger? A: Certain physical or environmental factors  can lead to or "trigger" a migraine. These include:  Foods.  Hormonal changes.  Weather.  Stress. It is important to remember that triggers are different for everyone. To help prevent migraine attacks, you need to figure out which triggers affect you. Keep a headache diary. This is a good way to track triggers. The diary will help you talk to your healthcare professional about your condition. Q: Does weather affect migraines? A: Bright sunshine, hot, humid  conditions, and drastic changes in barometric pressure may lead to, or "trigger," a migraine attack in some people. But studies have shown that weather does not act as a trigger for everyone with migraines. Q: What is the link between migraine and hormones? A: Hormones start and regulate many of your body's functions. Hormones keep your body in balance within a constantly changing environment. The levels of hormones in your body are unbalanced at times. Examples are during menstruation, pregnancy, or menopause. That can lead to a migraine attack. In fact, about three quarters of all women with migraine report that their attacks are related to the menstrual cycle.  Q: Is there an increased risk of stroke for migraine sufferers? A: The likelihood of a migraine attack causing a stroke is very remote. That is not to say that migraine sufferers cannot have a stroke associated with their migraines. In persons under age 29, the most common associated factor for stroke is migraine headache. But over the course of a person's normal life span, the occurrence of migraine headache may actually be associated with a reduced risk of dying from cerebrovascular disease due to stroke.  Q: What are acute medications for migraine? A: Acute medications are used to treat the pain of the headache after it has started. Examples over-the-counter medications, NSAIDs, ergots, and triptans.  Q: What are the triptans? A: Triptans are the newest class of abortive medications. They are specifically targeted to treat migraine. Triptans are vasoconstrictors. They moderate some chemical reactions in the brain. The triptans work on receptors in your brain. Triptans help to restore the balance of a neurotransmitter called serotonin. Fluctuations in levels of serotonin are thought to be a main cause of migraine.  Q: Are over-the-counter medications for migraine effective? A: Over-the-counter, or "OTC," medications may be effective in  relieving mild to moderate pain and associated symptoms of migraine. But you should see your caregiver before beginning any treatment regimen for migraine.  Q: What are preventive medications for migraine? A: Preventive medications for migraine are sometimes referred to as "prophylactic" treatments. They are used to reduce the frequency, severity, and length of migraine attacks. Examples of preventive medications include antiepileptic medications, antidepressants, beta-blockers, calcium channel blockers, and NSAIDs (nonsteroidal anti-inflammatory drugs). Q: Why are anticonvulsants used to treat migraine? A: During the past few years, there has been an increased interest in antiepileptic drugs for the prevention of migraine. They are sometimes referred to as "anticonvulsants". Both epilepsy and migraine may be caused by similar reactions in the brain.  Q: Why are antidepressants used to treat migraine? A: Antidepressants are typically used to treat people with depression. They may reduce migraine frequency by regulating chemical levels, such as serotonin, in the brain.  Q: What alternative therapies are used to treat migraine? A: The term "alternative therapies" is often used to describe treatments considered outside the scope of conventional Western medicine. Examples of alternative therapy include acupuncture, acupressure, and yoga. Another common alternative treatment is herbal therapy. Some herbs are believed to relieve headache pain. Always discuss  alternative therapies with your caregiver before proceeding. Some herbal products contain arsenic and other toxins. TENSION HEADACHES Q: What is a tension-type headache? What causes it? How can I treat it? A: Tension-type headaches occur randomly. They are often the result of temporary stress, anxiety, fatigue, or anger. Symptoms include soreness in your temples, a tightening band-like sensation around your head (a "vice-like" ache). Symptoms can also  include a pulling feeling, pressure sensations, and contracting head and neck muscles. The headache begins in your forehead, temples, or the back of your head and neck. Treatment for tension-type headache may include over-the-counter or prescription medications. Treatment may also include self-help techniques such as relaxation training and biofeedback. CLUSTER HEADACHES Q: What is a cluster headache? What causes it? How can I treat it? A: Cluster headache gets its name because the attacks come in groups. The pain arrives with little, if any, warning. It is usually on one side of the head. A tearing or bloodshot eye and a runny nose on the same side of the headache may also accompany the pain. Cluster headaches are believed to be caused by chemical reactions in the brain. They have been described as the most severe and intense of any headache type. Treatment for cluster headache includes prescription medication and oxygen. SINUS HEADACHES Q: What is a sinus headache? What causes it? How can I treat it? A: When a cavity in the bones of the face and skull (a sinus) becomes inflamed, the inflammation will cause localized pain. This condition is usually the result of an allergic reaction, a tumor, or an infection. If your headache is caused by a sinus blockage, such as an infection, you will probably have a fever. An x-ray will confirm a sinus blockage. Your caregiver's treatment might include antibiotics for the infection, as well as antihistamines or decongestants.  REBOUND HEADACHES Q: What is a rebound headache? What causes it? How can I treat it? A: A pattern of taking acute headache medications too often can lead to a condition known as "rebound headache." A pattern of taking too much headache medication includes taking it more than 2 days per week or in excessive amounts. That means more than the label or a caregiver advises. With rebound headaches, your medications not only stop relieving pain, they  actually begin to cause headaches. Doctors treat rebound headache by tapering the medication that is being overused. Sometimes your caregiver will gradually substitute a different type of treatment or medication. Stopping may be a challenge. Regularly overusing a medication increases the potential for serious side effects. Consult a caregiver if you regularly use headache medications more than 2 days per week or more than the label advises. ADDITIONAL QUESTIONS AND ANSWERS Q: What is biofeedback? A: Biofeedback is a self-help treatment. Biofeedback uses special equipment to monitor your body's involuntary physical responses. Biofeedback monitors:  Breathing.  Pulse.  Heart rate.  Temperature.  Muscle tension.  Brain activity. Biofeedback helps you refine and perfect your relaxation exercises. You learn to control the physical responses that are related to stress. Once the technique has been mastered, you do not need the equipment any more. Q: Are headaches hereditary? A: Four out of five (80%) of people that suffer report a family history of migraine. Scientists are not sure if this is genetic or a family predisposition. Despite the uncertainty, a child has a 50% chance of having migraine if one parent suffers. The child has a 75% chance if both parents suffer.  Q: Can children get headaches?  A: By the time they reach high school, most young people have experienced some type of headache. Many safe and effective approaches or medications can prevent a headache from occurring or stop it after it has begun.  Q: What type of doctor should I see to diagnose and treat my headache? A: Start with your primary caregiver. Discuss his or her experience and approach to headaches. Discuss methods of classification, diagnosis, and treatment. Your caregiver may decide to recommend you to a headache specialist, depending upon your symptoms or other physical conditions. Having diabetes, allergies, etc., may  require a more comprehensive and inclusive approach to your headache. The National Head[employer] Foundation will provide, upon request, a list of [facility] physician members in your state. Document Released: 12/10/2003 Document Revised: 12/12/2011 Document Reviewed: 05/19/2008 [facility] Patient Information 2015 [place], [place]. This information is not intended to replace advice given to you by your health care provider. Make sure you discuss any questions you have with your health care provider.  Hypertension Hypertension, commonly called high blood pressure, is when the force of blood pumping through your arteries is too strong. Your arteries are the blood vessels that carry blood from your heart throughout your body. A blood pressure reading consists of a higher number over a lower number, such as 110/72. The higher number (systolic) is the pressure inside your arteries when your heart pumps. The lower number (diastolic) is the pressure inside your arteries when your heart relaxes. Ideally you want your blood pressure below 120/80. Hypertension forces your heart to work harder to pump blood. Your arteries may become narrow or stiff. Having hypertension puts you at risk for heart disease, stroke, and other problems.  RISK FACTORS Some risk factors for high blood pressure are controllable. Others are not.  Risk factors you cannot control include:   Race. You may be at higher risk if you are African American.  Age. Risk increases with age.  Gender. Men are at higher risk than women before age 30 years. After age 67, women are at higher risk than men. Risk factors you can control include:  Not getting enough exercise or physical activity.  Being overweight.  Getting too much fat, sugar, calories, or salt in your diet.  Drinking too much alcohol. SIGNS AND SYMPTOMS Hypertension does not usually cause signs or symptoms. Extremely high blood pressure (hypertensive crisis) may cause headache, anxiety,  shortness of breath, and nosebleed. DIAGNOSIS  To check if you have hypertension, your health care provider will measure your blood pressure while you are seated, with your arm held at the level of your heart. It should be measured at least twice using the same arm. Certain conditions can cause a difference in blood pressure between your right and left arms. A blood pressure reading that is higher than normal on one occasion does not mean that you need treatment. If one blood pressure reading is high, ask your health care provider about having it checked again. TREATMENT  Treating high blood pressure includes making lifestyle changes and possibly taking medicine. Living a healthy lifestyle can help lower high blood pressure. You may need to change some of your habits. Lifestyle changes may include:  Following the DASH diet. This diet is high in fruits, vegetables, and whole grains. It is low in salt, red meat, and added sugars.  Getting at least 2 hours of brisk physical activity every week.  Losing weight if necessary.  Not smoking.  Limiting alcoholic beverages.  Learning ways to reduce stress. If  lifestyle changes are not enough to get your blood pressure under control, your health care provider may prescribe medicine. You may need to take more than one. Work closely with your health care provider to understand the risks and benefits. HOME CARE INSTRUCTIONS  Have your blood pressure rechecked as directed by your health care provider.   Take medicines only as directed by your health care provider. Follow the directions carefully. Blood pressure medicines must be taken as prescribed. The medicine does not work as well when you skip doses. Skipping doses also puts you at risk for problems.   Do not smoke.   Monitor your blood pressure at home as directed by your health care provider. SEEK MEDICAL CARE IF:   You think you are having a reaction to medicines taken.  You have  recurrent headaches or feel dizzy.  You have swelling in your ankles.  You have trouble with your vision. SEEK IMMEDIATE MEDICAL CARE IF:  You develop a severe headache or confusion.  You have unusual weakness, numbness, or feel faint.  You have severe chest or abdominal pain.  You vomit repeatedly.  You have trouble breathing. MAKE SURE YOU:   Understand these instructions.  Will watch your condition.  Will get help right away if you are not doing well or get worse. Document Released: 09/19/2005 Document Revised: 02/03/2014 Document Reviewed: 07/12/2013 [facility] Patient Information 2015 [place], [place]. This information is not intended to replace advice given to you by your health care provider. Make sure you discuss any questions you have with your health care provider.

## 2014-10-20 LAB — URINE CULTURE

## 2014-10-21 ENCOUNTER — Ambulatory Visit (INDEPENDENT_AMBULATORY_CARE_PROVIDER_SITE_OTHER): Payer: Medicare Other | Admitting: Physician Assistant

## 2014-10-21 ENCOUNTER — Encounter: Payer: Self-pay | Admitting: Physician Assistant

## 2014-10-21 VITALS — BP 158/90 | HR 72 | Temp 98.0°F | Resp 18 | Ht 63.0 in | Wt 178.0 lb

## 2014-10-21 DIAGNOSIS — R350 Frequency of micturition: Secondary | ICD-10-CM | POA: Diagnosis not present

## 2014-10-21 DIAGNOSIS — E039 Hypothyroidism, unspecified: Secondary | ICD-10-CM | POA: Diagnosis not present

## 2014-10-21 DIAGNOSIS — I10 Essential (primary) hypertension: Secondary | ICD-10-CM

## 2014-10-21 DIAGNOSIS — E785 Hyperlipidemia, unspecified: Secondary | ICD-10-CM | POA: Diagnosis not present

## 2014-10-21 DIAGNOSIS — J0100 Acute maxillary sinusitis, unspecified: Secondary | ICD-10-CM

## 2014-10-21 MED ORDER — DICLOFENAC SODIUM 75 MG PO TBEC: 75.0000 mg | DELAYED_RELEASE_TABLET | Freq: Two times a day (BID) | ORAL | Status: DC

## 2014-10-21 MED ORDER — LEVOTHYROXINE SODIUM 100 MCG PO TABS: 100.0000 ug | ORAL_TABLET | ORAL | Status: DC

## 2014-10-21 MED ORDER — OLMESARTAN-AMLODIPINE-HCTZ 40-10-25 MG PO TABS: ORAL_TABLET | ORAL | Status: DC

## 2014-10-21 MED ORDER — LABETALOL HCL 300 MG PO TABS: 300.0000 mg | ORAL_TABLET | Freq: Two times a day (BID) | ORAL | Status: DC

## 2014-10-21 MED ORDER — PRAVASTATIN SODIUM 40 MG PO TABS: 40.0000 mg | ORAL_TABLET | Freq: Every day | ORAL | Status: DC

## 2014-10-21 MED ORDER — AZITHROMYCIN 250 MG PO TABS: ORAL_TABLET | ORAL | Status: AC

## 2014-10-21 NOTE — Patient Instructions (Addendum)
-Take Labetalol- 1 whole tablet in morning and 1/2 tablet at night. -Take Tribenzor- 1/2 tablet per day.   -Take Z-Pak as prescribed.  We want blood pressure under 140/90.  Please get arm blood pressure arm machine instead of wrist.  Please keep appt on 11/03/14.   Hypertension Hypertension, commonly called high blood pressure, is when the force of blood pumping through your arteries is too strong. Your arteries are the blood vessels that carry blood from your heart throughout your body. A blood pressure reading consists of a higher number over a lower number, such as 110/72. The higher number (systolic) is the pressure inside your arteries when your heart pumps. The lower number (diastolic) is the pressure inside your arteries when your heart relaxes. Ideally you want your blood pressure below 120/80. Hypertension forces your heart to work harder to pump blood. Your arteries may become narrow or stiff. Having hypertension puts you at risk for heart disease, stroke, and other problems.  RISK FACTORS Some risk factors for high blood pressure are controllable. Others are not.  Risk factors you cannot control include:   Race. You may be at higher risk if you are African American.  Age. Risk increases with age.  Gender. Men are at higher risk than women before age 87 years. After age 59, women are at higher risk than men. Risk factors you can control include:  Not getting enough exercise or physical activity.  Being overweight.  Getting too much fat, sugar, calories, or salt in your diet.  Drinking too much alcohol. SIGNS AND SYMPTOMS Hypertension does not usually cause signs or symptoms. Extremely high blood pressure (hypertensive crisis) may cause headache, anxiety, shortness of breath, and nosebleed. DIAGNOSIS  To check if you have hypertension, your health care provider will measure your blood pressure while you are seated, with your arm held at the level of your heart. It should be  measured at least twice using the same arm. Certain conditions can cause a difference in blood pressure between your right and left arms. A blood pressure reading that is higher than normal on one occasion does not mean that you need treatment. If one blood pressure reading is high, ask your health care provider about having it checked again. TREATMENT  Treating high blood pressure includes making lifestyle changes and possibly taking medicine. Living a healthy lifestyle can help lower high blood pressure. You may need to change some of your habits. Lifestyle changes may include:  Following the DASH diet. This diet is high in fruits, vegetables, and whole grains. It is low in salt, red meat, and added sugars.  Getting at least 2 hours of brisk physical activity every week.  Losing weight if necessary.  Not smoking.  Limiting alcoholic beverages.  Learning ways to reduce stress. If lifestyle changes are not enough to get your blood pressure under control, your health care provider may prescribe medicine. You may need to take more than one. Work closely with your health care provider to understand the risks and benefits. HOME CARE INSTRUCTIONS  Have your blood pressure rechecked as directed by your health care provider.   Take medicines only as directed by your health care provider. Follow the directions carefully. Blood pressure medicines must be taken as prescribed. The medicine does not work as well when you skip doses. Skipping doses also puts you at risk for problems.   Do not smoke.   Monitor your blood pressure at home as directed by your health care provider. SEEK  MEDICAL CARE IF:   You think you are having a reaction to medicines taken.  You have recurrent headaches or feel dizzy.  You have swelling in your ankles.  You have trouble with your vision. SEEK IMMEDIATE MEDICAL CARE IF:  You develop a severe headache or confusion.  You have unusual weakness, numbness,  or feel faint.  You have severe chest or abdominal pain.  You vomit repeatedly.  You have trouble breathing. MAKE SURE YOU:   Understand these instructions.  Will watch your condition.  Will get help right away if you are not doing well or get worse. Document Released: 09/19/2005 Document Revised: 02/03/2014 Document Reviewed: 07/12/2013 [facility] Patient Information 2015 [place], [place]. This information is not intended to replace advice given to you by your health care provider. Make sure you discuss any questions you have with your health care provider.

## 2014-10-21 NOTE — Progress Notes (Signed)
HPI  [name] presents to the office today due to follow up from ED.  Patient was seen in ED on 10/19/14 for elevated BP and headache for 3 weeks.  The ED note was incomplete and are unable to see what they did for the patient at the time of the visit.  The left flank pain started a couple of days before 10/19/14.   Patients urine culture showed 85,000 colonies/mL and was contaminated.  Has been checking BP at home with wrist BP monitor and has been getting 188/111, 191/119.  Ran out of Tribenzor and is taking Labetalol- 1 whole tablet in morning and 1 whole in afternoon.  Patient states her headaches are better.  She is having sinus pressure that started at the same time as ED visit.  She reports fatigue, sweats, congestion, post-nasal drip.  She denies fever, chills, sore throat, cough, chest tightness, SOB, CP, abdominal pain, nausea, vomiting, diarrhea, constipation, dizziness and lightheadedness.    ED Visit- Pt comes in with cc of headache x 3 weeks, L flank pain and elevated BP. With the headaches, there is no nausea, vomiting, visual complains, seizures, altered mental status, loss of consciousness, new weakness, or numbness, no gait instability. PT has no visual complains. BP has been staying 160-200 SBP, pt is taking the meds as prescribed. Reports that she was on some sample BP meds, now out. Pt also has L flank pain x 2 days. She has some polyuria and dark urine. There is no dysuria, fevers, chills. No hx of renal stones. Pt's pain is worse with movement.  Pt also is out of her meds. Patient is a 74 y.o. female presenting with hypertension, flank pain, and headaches. The history is provided by the patient.   Review of Systems  Constitutional: Positive for malaise/fatigue and diaphoresis. Negative for fever and chills.  HENT: Positive for congestion. Negative for ear discharge and ear pain.        Sinus pressure, nasal congestion, post-nasal drip.    Eyes: Negative.    Respiratory: Negative for cough, shortness of breath and wheezing.   Cardiovascular: Negative for chest pain.  Gastrointestinal:       Left flank pain and using heating pad and much better.    Genitourinary: Positive for frequency. Negative for dysuria and urgency.       Urinary frequency and nocturia.  Musculoskeletal: Negative.   Skin: Negative.   Neurological: Negative.  Negative for headaches.  Psychiatric/Behavioral: Negative for depression. The patient is not nervous/anxious.    Past Medical History-  Past Medical History  Diagnosis Date  . Anemia   . Arthritis   . Myelodysplasia   . Hypertension   . HLD (hyperlipidemia)   . Hypothyroidism   . GERD (gastroesophageal reflux disease)   . Vitamin D deficiency    Medications-  Current Outpatient Prescriptions on File Prior to Visit  Medication Sig Dispense Refill  . aspirin 81 MG chewable tablet Chew 81 mg by mouth every morning.     . Cholecalciferol (VITAMIN D-3) 5000 UNITS TABS Take 10,000 Units by mouth 2 (two) times daily.     . Darbepoetin Alfa-Albumin (ARANESP IJ) Inject as directed every 21 ( twenty-one) days.     . diclofenac (VOLTAREN) 75 MG EC tablet Take 1 tablet (75 mg total) by mouth 2 (two) times daily. 60 tablet 1  . HYDROcodone-acetaminophen (NORCO/VICODIN) 5-325 MG per tablet Take 1 tablet by mouth every 6 (six) hours as needed. For headaches 100  tablet 0  . labetalol (NORMODYNE) 300 MG tablet Take 1 tablet (300 mg total) by mouth 2 (two) times daily. 180 tablet 99  . levothyroxine (SYNTHROID, LEVOTHROID) 100 MCG tablet Take 100 mcg by mouth as directed.    . pantoprazole (PROTONIX) 40 MG tablet TAKE ONE TABLET BY MOUTH ONCE DAILY FOR ACID REFLUX 90 tablet 0  . pravastatin (PRAVACHOL) 40 MG tablet Take 1 tablet (40 mg total) by mouth at bedtime. 30 tablet 2  . Olmesartan-Amlodipine-HCTZ 40-10-25 MG TABS 1/2 to 1 po QD (Patient not taking: Reported on 10/19/2014) 94 tablet 0   No current facility-administered  medications on file prior to visit.   Allergies-  Allergies  Allergen Reactions  . Codeine Anaphylaxis  . Penicillins Anaphylaxis  . Bactrim Itching and Swelling  . Clarithromycin Other (See Comments)    "Caused skin to peel off my hand."  . Flagyl [Metronidazole Hcl] Itching and Swelling  . Sulfa Antibiotics Itching and Swelling   Physical Exam BP 158/90 mmHg  Pulse 72  Temp(Src) 98 F (36.7 C) (Temporal)  Resp 18  Ht 5\' 3"  (1.6 m)  Wt 178 lb (80.74 kg)  BMI 31.54 kg/m2  SpO2 98%  BP recheck 138/78 Wt Readings from Last 3 Encounters:  10/21/14 178 lb (80.74 kg)  07/31/14 182 lb 12.8 oz (82.918 kg)  06/18/14 179 lb (81.194 kg)  Vitals Reviewed. General Appearance: Well nourished, in no apparent distress and had pleasant demeanor. Obese. Eyes:  PERRLA. EOMI. Conjunctiva is pink without edema, erythema or yellowing.  No scleral icterus. Sinuses: Maxillary tenderness upon palpation.  No Frontal tenderness. Ears: No erythema, edema or tenderness on both external ear cartilages and ear canals.  TMs are intact bilaterally with normal light reflexes and without erythema, edema or bulging. Nose: Nose is symmetrical and turbinates are erythematous and edematous bilaterally.  No polyps or paleness.  Rhinorrhea present. Throat: Oral pharynx is pink and moist.  Erythematous and non-edematous posterior pharynx.  Mucosa is intact and without lesions.  Tonsils are at +1 station bilaterally and do not have exudate.    Uvula is midline and not swollen. Neck: Supple,  JVD,  carotid bruits,  LAD,  thyromegaly or thyroid masses.  Trachea is midline.  Full range of motion in neck intact Respiratory: CTAB,  r/r/w or stridor. No increased effort of breathing. Cardio: RRR.   m/r/g.  S1S2nl.    Abdomen: Symmetrical, soft, nontender, and rounded.  +BS nl x4.  No CVA tenderness. Extremities:  C/C/E in upper and lower extremities. Pulses B/L +2  Skin: Warm, dry, intact without rashes, lesions,  ecchymosis, yellowing, cyanosis.    Neuro: Alert and oriented X3, cooperative.  Mood and affect appropriate to situation.  CN II-XII grossly intact.   Psych: Insight and Judgment appropriate.   Assessment and Plan 1. Essential hypertension -Refilled- labetalol (NORMODYNE) 300 MG tablet; Take 1 tablet (300 mg total) by mouth 2 (two) times daily.  Dispense: 180 tablet; Refill: 99 (Only taking 1 whole tablet in morning and 1/2 tablet in evening) -Gave pt 4 packages of samples- Olmesartan-Amlodipine-HCTZ 40-10-25 MG TABS; 1/2 tablet po QD  Dispense: 28 tablet; Refill: 0  2. Hyperlipidemia -Refilled- pravastatin (PRAVACHOL) 40 MG tablet; Take 1 tablet (40 mg total) by mouth at bedtime.  Dispense: 30 tablet; Refill: 2  3. Hypothyroidism, unspecified hypothyroidism type -Refilled- levothyroxine (SYNTHROID, LEVOTHROID) 100 MCG tablet; Take 1 tablet (100 mcg total) by mouth as directed.  Dispense: 30 tablet; Refill: 3  4. Urinary frequency Ordered  labs to R/O UTI. - Urinalysis, Routine w reflex microscopic - Urine culture  5. Acute maxillary sinusitis, recurrence not specified - azithromycin (ZITHROMAX) 250 MG tablet; Take 2 tablets PO on day 1, then 1 tablet PO Q24H x 4 days  Dispense: 6 tablet; Refill: 0  Discussed medication effects and SE's.  Pt agreed to treatment plan. Please keep your follow up appt on 11/03/14.  Karianna Gusman, Stephani Police, PA-C 11:21 AM [facility]

## 2014-10-22 LAB — URINALYSIS, ROUTINE W REFLEX MICROSCOPIC
Glucose, UA: NEGATIVE mg/dL
HGB URINE DIPSTICK: NEGATIVE
Ketones, ur: NEGATIVE mg/dL
Leukocytes, UA: NEGATIVE
NITRITE: NEGATIVE
Protein, ur: 100 mg/dL — AB
Specific Gravity, Urine: 1.015 (ref 1.005–1.030)
Urobilinogen, UA: 1 mg/dL (ref 0.0–1.0)
pH: 5.5 (ref 5.0–8.0)

## 2014-10-22 LAB — URINALYSIS, MICROSCOPIC ONLY: CRYSTALS: NONE SEEN

## 2014-10-23 LAB — URINE CULTURE
COLONY COUNT: NO GROWTH
Organism ID, Bacteria: NO GROWTH

## 2014-10-31 ENCOUNTER — Other Ambulatory Visit (HOSPITAL_BASED_OUTPATIENT_CLINIC_OR_DEPARTMENT_OTHER): Payer: Medicare Other

## 2014-10-31 ENCOUNTER — Ambulatory Visit: Payer: Medicare Other

## 2014-10-31 DIAGNOSIS — N289 Disorder of kidney and ureter, unspecified: Secondary | ICD-10-CM

## 2014-10-31 DIAGNOSIS — M25611 Stiffness of right shoulder, not elsewhere classified: Secondary | ICD-10-CM | POA: Diagnosis not present

## 2014-10-31 DIAGNOSIS — D649 Anemia, unspecified: Secondary | ICD-10-CM

## 2014-10-31 DIAGNOSIS — M75101 Unspecified rotator cuff tear or rupture of right shoulder, not specified as traumatic: Secondary | ICD-10-CM | POA: Diagnosis not present

## 2014-10-31 DIAGNOSIS — M25511 Pain in right shoulder: Secondary | ICD-10-CM | POA: Diagnosis not present

## 2014-10-31 LAB — CBC WITH DIFFERENTIAL/PLATELET
BASO%: 0.3 % (ref 0.0–2.0)
Basophils Absolute: 0 10*3/uL (ref 0.0–0.1)
EOS%: 2.2 % (ref 0.0–7.0)
Eosinophils Absolute: 0.1 10*3/uL (ref 0.0–0.5)
HEMATOCRIT: 33 % — AB (ref 34.8–46.6)
HGB: 11.1 g/dL — ABNORMAL LOW (ref 11.6–15.9)
LYMPH#: 1.6 10*3/uL (ref 0.9–3.3)
LYMPH%: 44.4 % (ref 14.0–49.7)
MCH: 28.6 pg (ref 25.1–34.0)
MCHC: 33.6 g/dL (ref 31.5–36.0)
MCV: 85.1 fL (ref 79.5–101.0)
MONO#: 0.3 10*3/uL (ref 0.1–0.9)
MONO%: 8.4 % (ref 0.0–14.0)
NEUT#: 1.7 10*3/uL (ref 1.5–6.5)
NEUT%: 44.7 % (ref 38.4–76.8)
NRBC: 0 % (ref 0–0)
Platelets: 131 10*3/uL — ABNORMAL LOW (ref 145–400)
RBC: 3.88 10*6/uL (ref 3.70–5.45)
RDW: 17.2 % — ABNORMAL HIGH (ref 11.2–14.5)
WBC: 3.7 10*3/uL — AB (ref 3.9–10.3)

## 2014-10-31 MED ORDER — DARBEPOETIN ALFA 300 MCG/0.6ML IJ SOSY: 300.0000 ug | PREFILLED_SYRINGE | INTRAMUSCULAR | Status: DC

## 2014-11-02 DIAGNOSIS — N183 Chronic kidney disease, stage 3 unspecified: Secondary | ICD-10-CM | POA: Insufficient documentation

## 2014-11-02 NOTE — Patient Instructions (Signed)

## 2014-11-02 NOTE — Progress Notes (Signed)
Patient ID: Susan [name], female   DOB: [birth year], 74 y.o.   MRN: [id number]  MEDICARE ANNUAL WELLNESS VISIT AND CPE  Assessment:   1. Essential hypertension  - TSH  2. Hyperlipidemia  - Lipid panel  3. Prediabetes  - Hemoglobin A1c - Insulin, fasting  4. Vitamin D deficiency  - Vit D  25 hydroxy (rtn osteoporosis monitoring)  5. CKD, 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)   6. Routine general medical examination at a health care facility   7. Gastroesophageal reflux disease   8. Hypothyroidism   9. MDS (myelodysplastic syndrome)   10. Medication management  - CBC with Differential/Platelet - BASIC METABOLIC PANEL WITH GFR - Hepatic function panel - Magnesium  11. At low risk for fall   12. Depression screen   Plan:   During the course of the visit the patient was educated and counseled about appropriate screening and preventive services including:    Pneumococcal vaccine   Influenza vaccine  Td vaccine  Screening electrocardiogram  Bone densitometry screening  Colorectal cancer screening  Diabetes screening  Glaucoma screening  Nutrition counseling   Advanced directives: requested  Screening recommendations, referrals: Vaccinations: DT vaccine 2007 Influenza vaccine declined Pneumococcal vaccine declined Prevnar vaccine declined Shingles vaccine declined Hep B vaccine not indicated  Nutrition assessed and recommended  Colonoscopy 2008 -recc 10 yr f/u Recommended yearly ophthalmology/optometry visit for glaucoma screening and checkup Recommended yearly dental visit for hygiene and checkup Advanced directives - requested  Conditions/risks identified: BMI: Discussed weight loss, diet, and increase physical activity.  Increase physical activity: AHA recommends 150 minutes of physical activity a week.  Medications reviewed Diabetes is at goal, ACE/ARB therapy: Not indicated Urinary Incontinence is an issue: discussed non pharmacology and  pharmacology options.  Fall risk: low- discussed PT, home fall assessment, medications.   Subjective:   Susan [name]  presents for [employer] Visit and OV.  Date of last medicare wellness visit is unknown.   This very nice 74 y.o.female presents for 3 month follow up with Hypertension, Hyperlipidemia, Pre-Diabetes, MDS and Vitamin D Deficiency. Patient is followed by Dr Alen Blew and pt receives periodic Aranesp injections for a multi-factorial anemia felt in large part due to  MDS & Chronic Kidney Disease with hx/o transfusions predating since 1984.   Patient has been treated for HTN since the 1980's. & BP has been controlled at home. Today's BP: 122/66 mmHg. Patient has had no complaints of any cardiac type chest pain, palpitations, dyspnea/orthopnea/PND, dizziness, claudication, or dependent edema. Patient has Stage 3 CKD with last GFR at 32 ml/min.    Hyperlipidemia is controlled with diet & meds. Patient denies myalgias or other med SE's. Last Lipids were at goal -  Total Chol 161; HDL 63; LDL 73; Trig 124 on 07/31/2014.   Also, the patient has history of PreDiabetes and has had no symptoms of reactive hypoglycemia, diabetic polys, paresthesias or visual blurring.  Last A1c was  4.4% on 07/31/2014.   Further, the patient also has history of Vitamin D Deficiency of 27 in 2012and supplements vitamin D without any suspected side-effects. Last vitamin D was 70 on  07/31/2014.  Names of Other Physician/Practitioners you currently use: 1. [facility] here for primary care 2. Dr Herbert Deaner, eye doctor, last visit Sept 2015 3. [facility], dentist, last visit Nov 2015  Patient Care Team: Unk Pinto, MD as PCP - General (Internal Medicine) Wyatt Portela, MD (Hematology and Oncology) Sandy Salaam  Deatra Ina, MD as Consulting Physician (Gastroenterology) Malena Catholic, MD as Referring Physician Johna Sheriff, MD as Consulting Physician  (Ophthalmology)  Medication Review: Medication Sig  . aspirin 81 MG chewable tablet Chew 81 mg by mouth every morning.   . Cholecalciferol (VITAMIN D-3) 5000 UNITS TABS Take 10,000 Units by mouth 2 (two) times daily.   . Darbepoetin Alfa-Albumin (ARANESP IJ) Inject as directed every 21 ( twenty-one) days.   . diclofenac (VOLTAREN) 75 MG EC tablet Take 1 tablet (75 mg total) by mouth 2 (two) times daily.  [name] HYDROcodone-acetaminophen (NORCO/VICODIN) 5-325 MG per tablet Take 1 tablet by mouth every 6 (six) hours as needed. For headaches  . labetalol (NORMODYNE) 300 MG tablet Take 1 tablet (300 mg total) by mouth 2 (two) times daily.  [name] levothyroxine (SYNTHROID, LEVOTHROID) 100 MCG tablet Take 1 tablet (100 mcg total) by mouth as directed. (Patient taking differently: Take 100 mcg by mouth as directed. Patient takes 1 tab 6 days a week and none on Sunday)  . Olmesartan-Amlodipine-HCTZ 40-10-25 MG TABS 1/2 tablet po QD  . pantoprazole (PROTONIX) 40 MG tablet TAKE ONE TABLET BY MOUTH ONCE DAILY FOR ACID REFLUX  . pravastatin (PRAVACHOL) 40 MG tablet Take 1 tablet (40 mg total) by mouth at bedtime.   Current Problems (verified) Patient Active Problem List   Diagnosis Date Noted  . CKD, 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) 11/02/2014  . Prediabetes 01/27/2014  . Medication management 01/27/2014  . Depression screen 01/27/2014  . Hyperlipidemia 08/27/2013  . Anemia 08/27/2013  . Hypertension   . Hypothyroidism   . GERD (gastroesophageal reflux disease)   . Vitamin D deficiency   . Internal hemorrhoids with other complication [id number]   Screening Tests   Immunization History  Administered Date(s) Administered  . Td 10/03/2005   Preventative care: Last colonoscopy: 2008 - recc 10 yr f/u  Prior vaccinations: TD or Tdap: 2007  Influenza:refused Pneumococcal: refused Prevnar: refused Shingles/Zostavax: refused  History reviewed: allergies, current medications, past family history, past medical  history, past social history, past surgical history and problem list  Risk Factors: Tobacco History  Substance Use Topics  . Smoking status: Former Smoker    Types: Cigarettes    Quit date: 10/02/1996  . Smokeless tobacco: Never Used  . Alcohol Use: 0.0 oz/week     Comment: glass of wine at least once a month.   She does not smoke.  Patient is a former smoker. Are there smokers in your home (other than you)?  No  Alcohol Current alcohol use: none  Caffeine Current caffeine use: coffee 1  cup /day  Exercise Current exercise: walking  Nutrition/Diet Current diet: in general, a "healthy" diet    Cardiac risk factors: advanced age (older than 74 for men, 18 for women), dyslipidemia, hypertension, obesity (BMI >= 30 kg/m2), sedentary lifestyle and smoking/ tobacco exposure.  Depression Screen (Note: if answer to either of the following is "Yes", a more complete depression screening is indicated)   Q1: Over the past two weeks, have you felt down, depressed or hopeless? No  Q2: Over the past two weeks, have you felt little interest or pleasure in doing things? No  Have you lost interest or pleasure in daily life? No  Do you often feel hopeless? No  Do you cry easily over simple problems? No  Activities of Daily Living In your present state of health, do you have any difficulty performing the following activities?:  Driving? No Managing money?  No Feeding yourself?  No Getting from bed to chair? No Climbing a flight of stairs? No Preparing food and eating?: No Bathing or showering? No Getting dressed: No Getting to the toilet? No Using the toilet:No Moving around from place to place: No In the past year have you fallen or had a near fall?:yes - slipped pushing a large trash can w/o any injury   Are you sexually active?  No  Do you have more than one partner?  No  Vision Difficulties: No  Hearing Difficulties: No Do you often ask people to speak up or repeat  themselves? No Do you experience ringing or noises in your ears? No Do you have difficulty understanding soft or whispered voices? Sometimes.  Cognition  Do you feel that you have a problem with memory?No  Do you often misplace items? No  Do you feel safe at home?  Yes  Advanced directives Does patient have a [place]? No Does patient have a Living Will? No  ROS:  In addition to the HPI above,  No Fever-chills,  No Headache, No changes with Vision or hearing,  No problems swallowing food or Liquids,  No Chest pain or productive Cough or Shortness of Breath,  No Abdominal pain, No Nausea or Vomitting, Bowel movements are regular,  No Blood in stool or Urine,  No dysuria,  No new skin rashes or bruises,  No new joints pains-aches,  No new weakness, tingling, numbness in any extremity,  No recent weight loss,  No polyuria, polydypsia or polyphagia,  No significant Mental Stressors.  A full 10 point Review of Systems was done, except as stated above, all other Review of Systems were negative  Objective:     BP 122/66   Pulse 72  Temp 97.9 F  Resp 18  Ht 5\' 3"    Wt 176 lb 9.6 oz    BMI 31.29   General appearance: alert, no distress, WD/WN, female   Cognitive Testing  Alert? Yes  Normal Appearance?Yes  Oriented to person? Yes  Place? Yes    Time? Yes  Recall of three objects?  Yes  Can perform simple calculations? Yes  Displays appropriate judgment? Yes  Can read the correct time from a watch/clock?Yes  HEENT: normocephalic, sclerae anicteric, TMs pearly, nares patent, no discharge or erythema, pharynx normal Oral cavity: MMM, no lesions Neck: supple, no lymphadenopathy, no thyromegaly, no masses Heart: RRR, normal S1, S2, no murmurs Lungs: CTA bilaterally, no wheezes, rhonchi, or rales Abdomen: +bs, soft, non tender, non distended, no masses, no hepatomegaly, no splenomegaly Musculoskeletal: nontender, no swelling, no obvious  deformity Extremities: no edema, no cyanosis, no clubbing Pulses: 2+ symmetric, upper and lower extremities, normal cap refill Neurological: alert, oriented x 3, CN2-12 intact, strength normal upper extremities and lower extremities, sensation normal throughout, DTRs 2+ throughout, no cerebellar signs, gait normal Psychiatric: normal affect, behavior normal, pleasant   Medicare Attestation I have personally reviewed: The patient's medical and social history Their use of alcohol, tobacco or illicit drugs Their current medications and supplements The patient's functional ability including ADLs,fall risks, home safety risks, cognitive, and hearing and visual impairment Diet and physical activities Evidence for depression or mood disorders  The patient's weight, height, BMI, and visual acuity have been recorded in the chart.  I have made referrals, counseling, and provided education to the patient based on review of the above and I have provided the patient with a written personalized care plan for preventive services.    Susan [name]  DAVID, MD   11/03/2014

## 2014-11-03 ENCOUNTER — Ambulatory Visit: Payer: Self-pay | Admitting: Physician Assistant

## 2014-11-03 ENCOUNTER — Ambulatory Visit (INDEPENDENT_AMBULATORY_CARE_PROVIDER_SITE_OTHER): Payer: Medicare Other | Admitting: Internal Medicine

## 2014-11-03 ENCOUNTER — Encounter: Payer: Self-pay | Admitting: Internal Medicine

## 2014-11-03 VITALS — BP 122/66 | HR 72 | Temp 97.9°F | Resp 18 | Ht 63.0 in | Wt 176.6 lb

## 2014-11-03 DIAGNOSIS — I10 Essential (primary) hypertension: Secondary | ICD-10-CM | POA: Diagnosis not present

## 2014-11-03 DIAGNOSIS — E559 Vitamin D deficiency, unspecified: Secondary | ICD-10-CM | POA: Diagnosis not present

## 2014-11-03 DIAGNOSIS — Z0000 Encounter for general adult medical examination without abnormal findings: Secondary | ICD-10-CM

## 2014-11-03 DIAGNOSIS — Z79899 Other long term (current) drug therapy: Secondary | ICD-10-CM

## 2014-11-03 DIAGNOSIS — R7309 Other abnormal glucose: Secondary | ICD-10-CM

## 2014-11-03 DIAGNOSIS — E782 Mixed hyperlipidemia: Secondary | ICD-10-CM

## 2014-11-03 DIAGNOSIS — E039 Hypothyroidism, unspecified: Secondary | ICD-10-CM

## 2014-11-03 DIAGNOSIS — R7303 Prediabetes: Secondary | ICD-10-CM

## 2014-11-03 DIAGNOSIS — K219 Gastro-esophageal reflux disease without esophagitis: Secondary | ICD-10-CM

## 2014-11-03 DIAGNOSIS — R6889 Other general symptoms and signs: Secondary | ICD-10-CM | POA: Diagnosis not present

## 2014-11-03 DIAGNOSIS — N183 Chronic kidney disease, stage 3 unspecified: Secondary | ICD-10-CM

## 2014-11-03 DIAGNOSIS — Z1331 Encounter for screening for depression: Secondary | ICD-10-CM

## 2014-11-03 DIAGNOSIS — D469 Myelodysplastic syndrome, unspecified: Secondary | ICD-10-CM

## 2014-11-03 DIAGNOSIS — Z0001 Encounter for general adult medical examination with abnormal findings: Secondary | ICD-10-CM | POA: Diagnosis not present

## 2014-11-03 DIAGNOSIS — Z9181 History of falling: Secondary | ICD-10-CM

## 2014-11-03 LAB — HEMOGLOBIN A1C
HEMOGLOBIN A1C: 4.6 % (ref <5.7)
MEAN PLASMA GLUCOSE: 85 mg/dL (ref <117)

## 2014-11-03 LAB — CBC WITH DIFFERENTIAL/PLATELET
BASOS ABS: 0 10*3/uL (ref 0.0–0.1)
BASOS PCT: 0 % (ref 0–1)
EOS ABS: 0.1 10*3/uL (ref 0.0–0.7)
Eosinophils Relative: 2 % (ref 0–5)
HEMATOCRIT: 34.1 % — AB (ref 36.0–46.0)
HEMOGLOBIN: 11 g/dL — AB (ref 12.0–15.0)
LYMPHS ABS: 1.6 10*3/uL (ref 0.7–4.0)
LYMPHS PCT: 47 % — AB (ref 12–46)
MCH: 28.1 pg (ref 26.0–34.0)
MCHC: 32.3 g/dL (ref 30.0–36.0)
MCV: 87 fL (ref 78.0–100.0)
MONO ABS: 0.2 10*3/uL (ref 0.1–1.0)
MPV: 11.2 fL (ref 8.6–12.4)
Monocytes Relative: 6 % (ref 3–12)
NEUTROS ABS: 1.5 10*3/uL — AB (ref 1.7–7.7)
Neutrophils Relative %: 45 % (ref 43–77)
PLATELETS: 145 10*3/uL — AB (ref 150–400)
RBC: 3.92 MIL/uL (ref 3.87–5.11)
RDW: 17.1 % — AB (ref 11.5–15.5)
WBC: 3.3 10*3/uL — ABNORMAL LOW (ref 4.0–10.5)

## 2014-11-04 LAB — LIPID PANEL
CHOL/HDL RATIO: 2.4 ratio
Cholesterol: 145 mg/dL (ref 0–200)
HDL: 61 mg/dL (ref >39)
LDL Cholesterol: 65 mg/dL (ref 0–99)
Triglycerides: 96 mg/dL (ref <150)
VLDL: 19 mg/dL (ref 0–40)

## 2014-11-04 LAB — BASIC METABOLIC PANEL WITH GFR
BUN: 29 mg/dL — AB (ref 6–23)
CHLORIDE: 105 meq/L (ref 96–112)
CO2: 26 mEq/L (ref 19–32)
Calcium: 9.4 mg/dL (ref 8.4–10.5)
Creat: 1.53 mg/dL — ABNORMAL HIGH (ref 0.50–1.10)
GFR, Est African American: 38 mL/min — ABNORMAL LOW
GFR, Est Non African American: 33 mL/min — ABNORMAL LOW
GLUCOSE: 85 mg/dL (ref 70–99)
Potassium: 4 mEq/L (ref 3.5–5.3)
Sodium: 142 mEq/L (ref 135–145)

## 2014-11-04 LAB — HEPATIC FUNCTION PANEL
ALT: 9 U/L (ref 0–35)
AST: 19 U/L (ref 0–37)
Albumin: 4.4 g/dL (ref 3.5–5.2)
Alkaline Phosphatase: 115 U/L (ref 39–117)
BILIRUBIN DIRECT: 0.2 mg/dL (ref 0.0–0.3)
Indirect Bilirubin: 0.7 mg/dL (ref 0.2–1.2)
TOTAL PROTEIN: 7.8 g/dL (ref 6.0–8.3)
Total Bilirubin: 0.9 mg/dL (ref 0.2–1.2)

## 2014-11-04 LAB — TSH: TSH: 5.271 u[IU]/mL — ABNORMAL HIGH (ref 0.350–4.500)

## 2014-11-04 LAB — INSULIN, FASTING: Insulin fasting, serum: 32.3 u[IU]/mL — ABNORMAL HIGH (ref 2.0–19.6)

## 2014-11-04 LAB — VITAMIN D 25 HYDROXY (VIT D DEFICIENCY, FRACTURES): Vit D, 25-Hydroxy: 78 ng/mL (ref 30–100)

## 2014-11-04 LAB — MAGNESIUM: Magnesium: 1.3 mg/dL — ABNORMAL LOW (ref 1.5–2.5)

## 2014-11-06 DIAGNOSIS — M75101 Unspecified rotator cuff tear or rupture of right shoulder, not specified as traumatic: Secondary | ICD-10-CM | POA: Diagnosis not present

## 2014-11-06 DIAGNOSIS — M25611 Stiffness of right shoulder, not elsewhere classified: Secondary | ICD-10-CM | POA: Diagnosis not present

## 2014-11-06 DIAGNOSIS — M25511 Pain in right shoulder: Secondary | ICD-10-CM | POA: Diagnosis not present

## 2014-11-07 DIAGNOSIS — M75101 Unspecified rotator cuff tear or rupture of right shoulder, not specified as traumatic: Secondary | ICD-10-CM | POA: Diagnosis not present

## 2014-11-07 DIAGNOSIS — M25511 Pain in right shoulder: Secondary | ICD-10-CM | POA: Diagnosis not present

## 2014-11-07 DIAGNOSIS — M25611 Stiffness of right shoulder, not elsewhere classified: Secondary | ICD-10-CM | POA: Diagnosis not present

## 2014-11-10 DIAGNOSIS — M25511 Pain in right shoulder: Secondary | ICD-10-CM | POA: Diagnosis not present

## 2014-11-10 DIAGNOSIS — M25611 Stiffness of right shoulder, not elsewhere classified: Secondary | ICD-10-CM | POA: Diagnosis not present

## 2014-11-10 DIAGNOSIS — M75101 Unspecified rotator cuff tear or rupture of right shoulder, not specified as traumatic: Secondary | ICD-10-CM | POA: Diagnosis not present

## 2014-11-13 DIAGNOSIS — M25511 Pain in right shoulder: Secondary | ICD-10-CM | POA: Diagnosis not present

## 2014-11-13 DIAGNOSIS — M75101 Unspecified rotator cuff tear or rupture of right shoulder, not specified as traumatic: Secondary | ICD-10-CM | POA: Diagnosis not present

## 2014-11-13 DIAGNOSIS — M25611 Stiffness of right shoulder, not elsewhere classified: Secondary | ICD-10-CM | POA: Diagnosis not present

## 2014-11-21 ENCOUNTER — Other Ambulatory Visit (HOSPITAL_BASED_OUTPATIENT_CLINIC_OR_DEPARTMENT_OTHER): Payer: Medicare Other

## 2014-11-21 ENCOUNTER — Telehealth: Payer: Self-pay | Admitting: Oncology

## 2014-11-21 ENCOUNTER — Ambulatory Visit (HOSPITAL_BASED_OUTPATIENT_CLINIC_OR_DEPARTMENT_OTHER): Payer: Medicare Other

## 2014-11-21 ENCOUNTER — Ambulatory Visit (HOSPITAL_BASED_OUTPATIENT_CLINIC_OR_DEPARTMENT_OTHER): Payer: Medicare Other | Admitting: Oncology

## 2014-11-21 VITALS — BP 125/59 | HR 68 | Temp 97.9°F | Resp 18 | Ht 63.0 in | Wt 175.6 lb

## 2014-11-21 DIAGNOSIS — D631 Anemia in chronic kidney disease: Secondary | ICD-10-CM

## 2014-11-21 DIAGNOSIS — N183 Chronic kidney disease, stage 3 unspecified: Secondary | ICD-10-CM

## 2014-11-21 DIAGNOSIS — D469 Myelodysplastic syndrome, unspecified: Secondary | ICD-10-CM

## 2014-11-21 DIAGNOSIS — N189 Chronic kidney disease, unspecified: Principal | ICD-10-CM

## 2014-11-21 DIAGNOSIS — D649 Anemia, unspecified: Secondary | ICD-10-CM | POA: Diagnosis not present

## 2014-11-21 LAB — CBC WITH DIFFERENTIAL/PLATELET
BASO%: 0.6 % (ref 0.0–2.0)
Basophils Absolute: 0 10*3/uL (ref 0.0–0.1)
EOS ABS: 0.1 10*3/uL (ref 0.0–0.5)
EOS%: 2.1 % (ref 0.0–7.0)
HCT: 27.5 % — ABNORMAL LOW (ref 34.8–46.6)
HGB: 9.2 g/dL — ABNORMAL LOW (ref 11.6–15.9)
LYMPH#: 1.9 10*3/uL (ref 0.9–3.3)
LYMPH%: 47.7 % (ref 14.0–49.7)
MCH: 28.5 pg (ref 25.1–34.0)
MCHC: 33.3 g/dL (ref 31.5–36.0)
MCV: 85.4 fL (ref 79.5–101.0)
MONO#: 0.3 10*3/uL (ref 0.1–0.9)
MONO%: 8.2 % (ref 0.0–14.0)
NEUT#: 1.7 10*3/uL (ref 1.5–6.5)
NEUT%: 41.4 % (ref 38.4–76.8)
Platelets: 144 10*3/uL — ABNORMAL LOW (ref 145–400)
RBC: 3.22 10*6/uL — ABNORMAL LOW (ref 3.70–5.45)
RDW: 15.3 % — AB (ref 11.2–14.5)
WBC: 4.1 10*3/uL (ref 3.9–10.3)

## 2014-11-21 MED ORDER — DARBEPOETIN ALFA 300 MCG/0.6ML IJ SOSY
300.0000 ug | PREFILLED_SYRINGE | INTRAMUSCULAR | Status: DC
  Administered 2014-11-21: 300 ug via SUBCUTANEOUS
  Filled 2014-11-21: qty 0.6

## 2014-11-21 NOTE — Telephone Encounter (Signed)
Gave avs & calendar for March thru June

## 2014-11-21 NOTE — Progress Notes (Signed)
Hematology and Oncology Follow Up Visit  Susan [name] [id number] 1941-07-08 74 y.o. 11/21/2014 9:36 AM Susan [name], MDMcKeown, [name], MD   Principle Diagnosis: This is a 74 year old woman with multifactorial anemia. 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  Secondary Diagnosis: Controlled hypertension.  Current therapy: She is on Aranesp 300 mcg every 3 weeks to keep her hemoglobin above 11.  Interim History:  Susan [name] presents today for a follow-up visit. Since her last visit, she is doing very well. She is tolerating Aranesp and have helped her overall quality of life and her performance status. She remains active and travel to [place] twice last year. She really functions well when her hemoglobin is close to 11. She has not had any injection-related problems. Overall, her performance status and activity level remain at a reasonable range at this time. She denies bleeding, bruising or lymphadenopathy. He has not reported any fevers or chills or sweats. Has not reported any weight loss or constitutional symptoms. She denies any headaches or blurred vision or syncope. Her blood pressure have been under better control. She has not reported any chest pain or palpitation. Does not report any shortness of breath or wheezing. She does not report any nausea or vomiting or change in her bowel habits. She does not report any hematochezia or melena.  Rest of her review of systems unremarkable.  Medications: I have reviewed the patient's current medications.  Current Outpatient Prescriptions  Medication Sig Dispense Refill  . aspirin 81 MG chewable tablet Chew 81 mg by mouth every morning.     . Cholecalciferol (VITAMIN D-3) 5000 UNITS TABS Take 10,000 Units by mouth 2 (two) times daily.     . Darbepoetin Alfa-Albumin (ARANESP IJ) Inject as directed every 21 ( twenty-one) days.     . diclofenac (VOLTAREN) 75 MG EC tablet Take 1 tablet (75 mg total) by  mouth 2 (two) times daily. 60 tablet 1  . HYDROcodone-acetaminophen (NORCO/VICODIN) 5-325 MG per tablet Take 1 tablet by mouth every 6 (six) hours as needed. For headaches 100 tablet 0  . labetalol (NORMODYNE) 300 MG tablet Take 1 tablet (300 mg total) by mouth 2 (two) times daily. 180 tablet 99  . levothyroxine (SYNTHROID, LEVOTHROID) 100 MCG tablet Take 1 tablet (100 mcg total) by mouth as directed. (Patient taking differently: Take 100 mcg by mouth as directed. Patient takes 1 tab 6 days a week and none on Sunday) 30 tablet 3  . Olmesartan-Amlodipine-HCTZ 40-10-25 MG TABS 1/2 tablet po QD 28 tablet 0  . pantoprazole (PROTONIX) 40 MG tablet TAKE ONE TABLET BY MOUTH ONCE DAILY FOR ACID REFLUX 90 tablet 0  . pravastatin (PRAVACHOL) 20 MG tablet Take 20 mg by mouth daily.     No current facility-administered medications for this visit.    Allergies:  Allergies  Allergen Reactions  . Codeine Anaphylaxis  . Penicillins Anaphylaxis  . Bactrim Itching and Swelling  . Clarithromycin Other (See Comments)    "Caused skin to peel off my hand."  . Flagyl [Metronidazole Hcl] Itching and Swelling  . Sulfa Antibiotics Itching and Swelling    Past Medical History, Surgical history, Social history, and Family History were reviewed and updated.  [name]  Physical Exam: Blood pressure 125/59, pulse 68, temperature 97.9 F (36.6 C), temperature source Oral, resp. rate 18, height 5\' 3"  (1.6 m), weight 175 lb 9.6 oz (79.652 kg). ECOG: 1 General appearance: alert and cooperative Head: Normocephalic, without obvious abnormality Neck: no adenopathy  Lymph nodes: Cervical, supraclavicular, and axillary nodes normal. Heart:regular rate and rhythm, S1, S2 normal, no murmur, click, rub or gallop Lung:chest clear, no wheezing, rales, normal symmetric air entry Abdomen: soft, non-tender, without masses or organomegaly EXT:no erythema, induration, or nodules   Lab Results: Lab Results  Component Value Date    WBC 4.1 11/21/2014   HGB 9.2* 11/21/2014   HCT 27.5* 11/21/2014   MCV 85.4 11/21/2014   PLT 144* 11/21/2014     Chemistry      Component Value Date/Time   NA 142 11/03/2014 1058   K 4.0 11/03/2014 1058   CL 105 11/03/2014 1058   CO2 26 11/03/2014 1058   BUN 29* 11/03/2014 1058   CREATININE 1.53* 11/03/2014 1058   CREATININE 1.40* 10/19/2014 1301      Component Value Date/Time   CALCIUM 9.4 11/03/2014 1058   ALKPHOS 115 11/03/2014 1058   AST 19 11/03/2014 1058   ALT 9 11/03/2014 1058   BILITOT 0.9 11/03/2014 1058     Impression and Plan:  This is a pleasant 74 year old female with the following issues:  1. Multifactorial anemia: She has an element of anemia of renal disease as well as anemia of possible myelodysplasia. Hemoglobin is 9.2 today and she will receive Aranesp.The plan is to keep her on Aranesp 300 mcg every 3 weeks to keep her hemoglobin above 11.  2. Hypertension:  Her blood pressure under excellent control today. 3. History of questionable myelodysplasia: No major changes in her counts and will continue to monitor this. 4. Follow up. Every 3 weeks for CBC and injection. Visit in about 4 months.       [facility] MD 2/19/20169:36 AM

## 2014-11-24 DIAGNOSIS — M25511 Pain in right shoulder: Secondary | ICD-10-CM | POA: Diagnosis not present

## 2014-11-24 DIAGNOSIS — M25611 Stiffness of right shoulder, not elsewhere classified: Secondary | ICD-10-CM | POA: Diagnosis not present

## 2014-11-24 DIAGNOSIS — M75101 Unspecified rotator cuff tear or rupture of right shoulder, not specified as traumatic: Secondary | ICD-10-CM | POA: Diagnosis not present

## 2014-12-01 DIAGNOSIS — M75101 Unspecified rotator cuff tear or rupture of right shoulder, not specified as traumatic: Secondary | ICD-10-CM | POA: Diagnosis not present

## 2014-12-01 DIAGNOSIS — M25611 Stiffness of right shoulder, not elsewhere classified: Secondary | ICD-10-CM | POA: Diagnosis not present

## 2014-12-01 DIAGNOSIS — M25511 Pain in right shoulder: Secondary | ICD-10-CM | POA: Diagnosis not present

## 2014-12-04 DIAGNOSIS — M75101 Unspecified rotator cuff tear or rupture of right shoulder, not specified as traumatic: Secondary | ICD-10-CM | POA: Diagnosis not present

## 2014-12-04 DIAGNOSIS — M25511 Pain in right shoulder: Secondary | ICD-10-CM | POA: Diagnosis not present

## 2014-12-04 DIAGNOSIS — M25611 Stiffness of right shoulder, not elsewhere classified: Secondary | ICD-10-CM | POA: Diagnosis not present

## 2014-12-12 ENCOUNTER — Ambulatory Visit (HOSPITAL_BASED_OUTPATIENT_CLINIC_OR_DEPARTMENT_OTHER): Payer: Medicare Other

## 2014-12-12 ENCOUNTER — Other Ambulatory Visit (HOSPITAL_BASED_OUTPATIENT_CLINIC_OR_DEPARTMENT_OTHER): Payer: Medicare Other

## 2014-12-12 VITALS — BP 132/71 | HR 61 | Temp 98.4°F

## 2014-12-12 DIAGNOSIS — D469 Myelodysplastic syndrome, unspecified: Secondary | ICD-10-CM

## 2014-12-12 DIAGNOSIS — D649 Anemia, unspecified: Secondary | ICD-10-CM

## 2014-12-12 DIAGNOSIS — D638 Anemia in other chronic diseases classified elsewhere: Secondary | ICD-10-CM

## 2014-12-12 LAB — CBC WITH DIFFERENTIAL/PLATELET
BASO%: 0.7 % (ref 0.0–2.0)
Basophils Absolute: 0 10*3/uL (ref 0.0–0.1)
EOS%: 2.3 % (ref 0.0–7.0)
Eosinophils Absolute: 0.1 10*3/uL (ref 0.0–0.5)
HCT: 30.7 % — ABNORMAL LOW (ref 34.8–46.6)
HGB: 9.9 g/dL — ABNORMAL LOW (ref 11.6–15.9)
LYMPH%: 52.5 % — ABNORMAL HIGH (ref 14.0–49.7)
MCH: 28.5 pg (ref 25.1–34.0)
MCHC: 32.4 g/dL (ref 31.5–36.0)
MCV: 88 fL (ref 79.5–101.0)
MONO#: 0.3 10*3/uL (ref 0.1–0.9)
MONO%: 9.4 % (ref 0.0–14.0)
NEUT#: 1 10*3/uL — ABNORMAL LOW (ref 1.5–6.5)
NEUT%: 35.1 % — AB (ref 38.4–76.8)
PLATELETS: 131 10*3/uL — AB (ref 145–400)
RBC: 3.49 10*6/uL — ABNORMAL LOW (ref 3.70–5.45)
RDW: 15.7 % — ABNORMAL HIGH (ref 11.2–14.5)
WBC: 2.9 10*3/uL — ABNORMAL LOW (ref 3.9–10.3)
lymph#: 1.5 10*3/uL (ref 0.9–3.3)

## 2014-12-12 MED ORDER — DARBEPOETIN ALFA 300 MCG/0.6ML IJ SOSY
300.0000 ug | PREFILLED_SYRINGE | INTRAMUSCULAR | Status: DC
  Administered 2014-12-12: 300 ug via SUBCUTANEOUS
  Filled 2014-12-12: qty 0.6

## 2014-12-15 DIAGNOSIS — M25511 Pain in right shoulder: Secondary | ICD-10-CM | POA: Diagnosis not present

## 2014-12-15 DIAGNOSIS — M25611 Stiffness of right shoulder, not elsewhere classified: Secondary | ICD-10-CM | POA: Diagnosis not present

## 2014-12-15 DIAGNOSIS — M75101 Unspecified rotator cuff tear or rupture of right shoulder, not specified as traumatic: Secondary | ICD-10-CM | POA: Diagnosis not present

## 2014-12-17 ENCOUNTER — Other Ambulatory Visit: Payer: Self-pay | Admitting: Internal Medicine

## 2014-12-18 DIAGNOSIS — M25611 Stiffness of right shoulder, not elsewhere classified: Secondary | ICD-10-CM | POA: Diagnosis not present

## 2014-12-18 DIAGNOSIS — M75101 Unspecified rotator cuff tear or rupture of right shoulder, not specified as traumatic: Secondary | ICD-10-CM | POA: Diagnosis not present

## 2014-12-18 DIAGNOSIS — M25511 Pain in right shoulder: Secondary | ICD-10-CM | POA: Diagnosis not present

## 2014-12-22 ENCOUNTER — Encounter: Payer: Self-pay | Admitting: Internal Medicine

## 2014-12-25 ENCOUNTER — Ambulatory Visit (INDEPENDENT_AMBULATORY_CARE_PROVIDER_SITE_OTHER): Payer: Medicare Other | Admitting: Internal Medicine

## 2014-12-25 ENCOUNTER — Encounter: Payer: Self-pay | Admitting: Internal Medicine

## 2014-12-25 VITALS — BP 122/60 | HR 68 | Temp 97.4°F | Resp 16 | Ht 63.0 in | Wt 172.8 lb

## 2014-12-25 DIAGNOSIS — Z91048 Other nonmedicinal substance allergy status: Secondary | ICD-10-CM | POA: Diagnosis not present

## 2014-12-25 DIAGNOSIS — I10 Essential (primary) hypertension: Secondary | ICD-10-CM

## 2014-12-25 DIAGNOSIS — Z9109 Other allergy status, other than to drugs and biological substances: Secondary | ICD-10-CM

## 2014-12-25 MED ORDER — IPRATROPIUM BROMIDE 0.03 % NA SOLN: NASAL | Status: DC

## 2014-12-25 MED ORDER — PREDNISONE 20 MG PO TABS: ORAL_TABLET | ORAL | Status: DC

## 2014-12-25 MED ORDER — LOSARTAN POTASSIUM-HCTZ 100-25 MG PO TABS: ORAL_TABLET | ORAL | Status: DC

## 2014-12-26 NOTE — Progress Notes (Signed)
Patient ID: Susan [name], female   DOB: 10/15/1940, 74 y.o.   MRN: [id number]   This very nice 74 y.o. WBF presents for 3 month follow up with Hypertension, Hyperlipidemia, Pre-Diabetes and Vitamin D Deficiency. Patient is also on Epo for MDS.   Patient is treated for HTN & BP has been controlled at home. Today's BP: 122/60 mmHg. Patient has had no complaints of any cardiac type chest pain, palpitations, dyspnea/orthopnea/PND, dizziness, claudication, or dependent edema. Patient is due for refill of [facility], mental health) and is aware of the unavailability of sx's an the high co-pay of the medication.    Hyperlipidemia is controlled with diet & meds. Patient denies myalgias or other med SE's. Last Lipids were at goal  Chol 145; HDL 61; LDL 65; Triglycerides 962/10/2014:   Also, the patient has history of PreDiabetes and has had no symptoms of reactive hypoglycemia, diabetic polys, paresthesias or visual blurring.  Last A1c was 4.6% on  11/03/2014.   Further, the patient also has history of Vitamin D Deficiency and supplements vitamin D without any suspected side-effects. Last vitamin D was  78 on  11/03/2014.  Medication Sig  . aspirin 81 MG chewable tablet Chew 81 mg by mouth every morning.   . Cholecalciferol (VITAMIN D-3) 5000 UNITS TABS Take 10,000 Units by mouth 2 (two) times daily.   . Darbepoetin Alfa-Albumin (ARANESP IJ) Inject as directed every 21 ( twenty-one) days.   . diclofenac (VOLTAREN) 75 MG EC tablet Take 1 tablet (75 mg total) by mouth 2 (two) times daily.  [name] HYDROcodone-acetaminophen (NORCO/VICODIN) 5-325 MG per tablet Take 1 tablet by mouth every 6 (six) hours as needed. For headaches  . labetalol (NORMODYNE) 300 MG tablet Take 1 tablet (300 mg total) by mouth 2 (two) times daily.  [name] levothyroxine  100 MCG tablet Take 1 tablet  (Patient taking 1 tab 6 days a week and none on Sunday)  . pantoprazole (PROTONIX) 40 MG tablet TAKE ONE TABLET BY MOUTH ONCE DAILY FOR  ACID  REFLUX  .  pravastatin (PRAVACHOL) 20 MG tablet Take 20 mg by mouth daily.  . Olmesartan-Amlodipine-HCTZ 40-10-25 MG TABS 1/2 tablet po QD   Allergies  Allergen Reactions  . Codeine Anaphylaxis  . Penicillins Anaphylaxis  . Bactrim Itching and Swelling  . Clarithromycin Other (See Comments)    "Caused skin to peel off my hand."  . Flagyl [Metronidazole Hcl] Itching and Swelling  . Sulfa Antibiotics Itching and Swelling   PMHx:   Past Medical History  Diagnosis Date  . Anemia   . Arthritis   . Myelodysplasia   . Hypertension   . HLD (hyperlipidemia)   . Hypothyroidism   . GERD (gastroesophageal reflux disease)   . Vitamin D deficiency    Immunization History  Administered Date(s) Administered  . Td 10/03/2005   Past Surgical History  Procedure Laterality Date  . Umbilical hernia repair    . Rotator cuff repair Left   . Inguinal hernia repair    . Carpal tunnel release Right   . Dilation and curettage of uterus     FHx:    Reviewed / unchanged  SHx:    Reviewed / unchanged  Systems Review:  Constitutional: Denies fever, chills, wt changes, headaches, insomnia, fatigue, night sweats, change in appetite. Eyes: Denies redness, blurred vision, diplopia, discharge, itchy, watery eyes.  ENT: Denies discharge, epistaxis, sore throat, earache, hearing loss, dental pain, tinnitus, vertigo, sinus pain, snoring. Today c/o head congestion and clear watery nasal  drainage.   CV: Denies chest pain, palpitations, irregular heartbeat, syncope, dyspnea, diaphoresis, orthopnea, PND, claudication or edema. Respiratory: denies cough, dyspnea, DOE, pleurisy, hoarseness, laryngitis, wheezing.  Gastrointestinal: Denies dysphagia, odynophagia, heartburn, reflux, water brash, abdominal pain or cramps, nausea, vomiting, bloating, diarrhea, constipation, hematemesis, melena, hematochezia  or hemorrhoids. Genitourinary: Denies dysuria, frequency, urgency, nocturia, hesitancy, discharge, hematuria or flank  pain. Musculoskeletal: Denies arthralgias, myalgias, stiffness, jt. swelling, pain, limping or strain/sprain.  Skin: Denies pruritus, rash, hives, warts, acne, eczema or change in skin lesion(s). Neuro: No weakness, tremor, incoordination, spasms, paresthesia or pain. Psychiatric: Denies confusion, memory loss or sensory loss. Endo: Denies change in weight, skin or hair change.  Heme/Lymph: No excessive bleeding, bruising or enlarged lymph nodes.  Physical Exam  BP 122/60   Pulse 68  Temp 97.4 F   Resp 16  Ht 5\' 3"    Wt 172 lb 12.8 oz     BMI 30.62   In no Distress  HEENT - Eac's patent. TM's Nl. EOM's full. PERRLA. NasoOroPharynx clear. Neck - supple. Nl Thyroid. Carotids 2+ & No bruits, nodes, JVD Chest - Clear equal BS w/o Rales, rhonchi, wheezes. Cor - Nl HS. RRR w/o sig MGR. PP 1(+). No edema. Abd - No palpable organomegaly, masses or tenderness. BS nl. MS- FROM w/o deformities. Muscle power, tone and bulk Nl. Gait Nl. Neuro - No obvious Cr N abnormalities. Sensory, motor and Cerebellar functions appear Nl w/o focal abnormalities. Psyche - Mental status normal & appropriate.  No delusions, ideations or obvious mood abnormalities.  Assessment and Plan:  1. Essential hypertension  - will switch to losartan-hctz  (HYZAAR) 100-25 MG per tablet; Take 1 tablet daily for BP  Dispense: 90 tablet; Refill: 1  2. Environmental allergies  - predniSONE (DELTASONE) 20 MG tablet; 1 tab 3 x day for 3 days, then 1 tab 2 x day for 3 days, then 1 tab 1 x day for 5 days  Dispense: 20 tablet; Refill: 0 - ipratropium (ATROVENT) 0.03 % nasal spray; Take 1 to 2 sprays into each nostril up to 2 or 3 x day as needed  Dispense: 30 mL; Refill: 99     Recommended regular exercise, BP monitoring, weight control, and discussed med and SE's. Recommended labs to assess and monitor clinical status. Further disposition pending results of labs. Over 30 minutes of exam, counseling, chart review was  performed

## 2015-01-02 ENCOUNTER — Other Ambulatory Visit (HOSPITAL_BASED_OUTPATIENT_CLINIC_OR_DEPARTMENT_OTHER): Payer: Medicare Other

## 2015-01-02 ENCOUNTER — Ambulatory Visit: Payer: Medicare Other

## 2015-01-02 DIAGNOSIS — D649 Anemia, unspecified: Secondary | ICD-10-CM

## 2015-01-02 LAB — CBC WITH DIFFERENTIAL/PLATELET
BASO%: 0.8 % (ref 0.0–2.0)
Basophils Absolute: 0 10*3/uL (ref 0.0–0.1)
EOS%: 2.4 % (ref 0.0–7.0)
Eosinophils Absolute: 0.1 10*3/uL (ref 0.0–0.5)
HEMATOCRIT: 33.9 % — AB (ref 34.8–46.6)
HEMOGLOBIN: 11.2 g/dL — AB (ref 11.6–15.9)
LYMPH%: 47.3 % (ref 14.0–49.7)
MCH: 28.5 pg (ref 25.1–34.0)
MCHC: 33.1 g/dL (ref 31.5–36.0)
MCV: 86.1 fL (ref 79.5–101.0)
MONO#: 0.3 10*3/uL (ref 0.1–0.9)
MONO%: 9.9 % (ref 0.0–14.0)
NEUT#: 1.2 10*3/uL — ABNORMAL LOW (ref 1.5–6.5)
NEUT%: 39.6 % (ref 38.4–76.8)
PLATELETS: 144 10*3/uL — AB (ref 145–400)
RBC: 3.94 10*6/uL (ref 3.70–5.45)
RDW: 15 % — ABNORMAL HIGH (ref 11.2–14.5)
WBC: 3.1 10*3/uL — ABNORMAL LOW (ref 3.9–10.3)
lymph#: 1.5 10*3/uL (ref 0.9–3.3)

## 2015-01-02 MED ORDER — DARBEPOETIN ALFA 300 MCG/0.6ML IJ SOSY: 300.0000 ug | PREFILLED_SYRINGE | INTRAMUSCULAR | Status: DC

## 2015-01-06 ENCOUNTER — Other Ambulatory Visit: Payer: Self-pay | Admitting: Internal Medicine

## 2015-01-06 DIAGNOSIS — I10 Essential (primary) hypertension: Secondary | ICD-10-CM

## 2015-01-06 DIAGNOSIS — Z9109 Other allergy status, other than to drugs and biological substances: Secondary | ICD-10-CM

## 2015-01-06 MED ORDER — PREDNISONE 20 MG PO TABS: ORAL_TABLET | ORAL | Status: DC

## 2015-01-06 MED ORDER — LOSARTAN POTASSIUM-HCTZ 100-25 MG PO TABS: ORAL_TABLET | ORAL | Status: DC

## 2015-01-06 MED ORDER — IPRATROPIUM BROMIDE 0.03 % NA SOLN: NASAL | Status: DC

## 2015-01-23 ENCOUNTER — Other Ambulatory Visit (HOSPITAL_BASED_OUTPATIENT_CLINIC_OR_DEPARTMENT_OTHER): Payer: Medicare Other

## 2015-01-23 ENCOUNTER — Ambulatory Visit (HOSPITAL_BASED_OUTPATIENT_CLINIC_OR_DEPARTMENT_OTHER): Payer: Medicare Other

## 2015-01-23 VITALS — BP 143/68 | HR 62 | Temp 98.6°F

## 2015-01-23 DIAGNOSIS — D469 Myelodysplastic syndrome, unspecified: Secondary | ICD-10-CM

## 2015-01-23 DIAGNOSIS — D649 Anemia, unspecified: Secondary | ICD-10-CM

## 2015-01-23 DIAGNOSIS — D638 Anemia in other chronic diseases classified elsewhere: Secondary | ICD-10-CM

## 2015-01-23 DIAGNOSIS — N183 Chronic kidney disease, stage 3 unspecified: Secondary | ICD-10-CM

## 2015-01-23 LAB — CBC WITH DIFFERENTIAL/PLATELET
BASO%: 0 % (ref 0.0–2.0)
Basophils Absolute: 0 10*3/uL (ref 0.0–0.1)
EOS%: 2.1 % (ref 0.0–7.0)
Eosinophils Absolute: 0.1 10*3/uL (ref 0.0–0.5)
HEMATOCRIT: 29.4 % — AB (ref 34.8–46.6)
HGB: 9.9 g/dL — ABNORMAL LOW (ref 11.6–15.9)
LYMPH%: 33.6 % (ref 14.0–49.7)
MCH: 28.4 pg (ref 25.1–34.0)
MCHC: 33.7 g/dL (ref 31.5–36.0)
MCV: 84.2 fL (ref 79.5–101.0)
MONO#: 0.4 10*3/uL (ref 0.1–0.9)
MONO%: 7.8 % (ref 0.0–14.0)
NEUT#: 3 10*3/uL (ref 1.5–6.5)
NEUT%: 56.5 % (ref 38.4–76.8)
PLATELETS: 147 10*3/uL (ref 145–400)
RBC: 3.49 10*6/uL — AB (ref 3.70–5.45)
RDW: 15 % — ABNORMAL HIGH (ref 11.2–14.5)
WBC: 5.3 10*3/uL (ref 3.9–10.3)
lymph#: 1.8 10*3/uL (ref 0.9–3.3)

## 2015-01-23 MED ORDER — DARBEPOETIN ALFA 300 MCG/0.6ML IJ SOSY
300.0000 ug | PREFILLED_SYRINGE | INTRAMUSCULAR | Status: DC
  Administered 2015-01-23: 300 ug via SUBCUTANEOUS
  Filled 2015-01-23: qty 0.6

## 2015-01-30 ENCOUNTER — Encounter: Payer: Self-pay | Admitting: Internal Medicine

## 2015-02-13 ENCOUNTER — Other Ambulatory Visit (HOSPITAL_BASED_OUTPATIENT_CLINIC_OR_DEPARTMENT_OTHER): Payer: Medicare Other

## 2015-02-13 ENCOUNTER — Ambulatory Visit (HOSPITAL_BASED_OUTPATIENT_CLINIC_OR_DEPARTMENT_OTHER): Payer: Medicare Other

## 2015-02-13 VITALS — BP 145/73 | HR 65 | Temp 98.2°F

## 2015-02-13 DIAGNOSIS — D631 Anemia in chronic kidney disease: Secondary | ICD-10-CM

## 2015-02-13 DIAGNOSIS — D649 Anemia, unspecified: Secondary | ICD-10-CM | POA: Diagnosis present

## 2015-02-13 DIAGNOSIS — N189 Chronic kidney disease, unspecified: Secondary | ICD-10-CM

## 2015-02-13 LAB — CBC WITH DIFFERENTIAL/PLATELET
BASO%: 0.7 % (ref 0.0–2.0)
BASOS ABS: 0 10*3/uL (ref 0.0–0.1)
EOS%: 2.9 % (ref 0.0–7.0)
Eosinophils Absolute: 0.1 10*3/uL (ref 0.0–0.5)
HEMATOCRIT: 32.2 % — AB (ref 34.8–46.6)
HEMOGLOBIN: 10.6 g/dL — AB (ref 11.6–15.9)
LYMPH#: 1.9 10*3/uL (ref 0.9–3.3)
LYMPH%: 53.6 % — ABNORMAL HIGH (ref 14.0–49.7)
MCH: 28.7 pg (ref 25.1–34.0)
MCHC: 32.9 g/dL (ref 31.5–36.0)
MCV: 87.4 fL (ref 79.5–101.0)
MONO#: 0.3 10*3/uL (ref 0.1–0.9)
MONO%: 9.1 % (ref 0.0–14.0)
NEUT%: 33.7 % — AB (ref 38.4–76.8)
NEUTROS ABS: 1.2 10*3/uL — AB (ref 1.5–6.5)
Platelets: 123 10*3/uL — ABNORMAL LOW (ref 145–400)
RBC: 3.68 10*6/uL — ABNORMAL LOW (ref 3.70–5.45)
RDW: 17.4 % — ABNORMAL HIGH (ref 11.2–14.5)
WBC: 3.5 10*3/uL — AB (ref 3.9–10.3)

## 2015-02-13 LAB — TECHNOLOGIST REVIEW

## 2015-02-13 MED ORDER — DARBEPOETIN ALFA 300 MCG/0.6ML IJ SOSY
300.0000 ug | PREFILLED_SYRINGE | INTRAMUSCULAR | Status: DC
  Administered 2015-02-13: 300 ug via SUBCUTANEOUS
  Filled 2015-02-13: qty 0.6

## 2015-02-16 ENCOUNTER — Other Ambulatory Visit: Payer: Self-pay | Admitting: Internal Medicine

## 2015-02-16 DIAGNOSIS — J324 Chronic pansinusitis: Secondary | ICD-10-CM

## 2015-02-16 DIAGNOSIS — J309 Allergic rhinitis, unspecified: Secondary | ICD-10-CM

## 2015-02-16 MED ORDER — MONTELUKAST SODIUM 10 MG PO TABS: ORAL_TABLET | ORAL | Status: DC

## 2015-02-16 MED ORDER — AZITHROMYCIN 250 MG PO TABS: ORAL_TABLET | ORAL | Status: DC

## 2015-02-21 ENCOUNTER — Emergency Department (HOSPITAL_COMMUNITY): Payer: Medicare Other

## 2015-02-21 ENCOUNTER — Encounter (HOSPITAL_COMMUNITY): Payer: Self-pay | Admitting: Emergency Medicine

## 2015-02-21 ENCOUNTER — Emergency Department (HOSPITAL_COMMUNITY)
Admission: EM | Admit: 2015-02-21 | Discharge: 2015-02-22 | Disposition: A | Discharge status: Home / Self Care | Payer: Medicare Other | Attending: Emergency Medicine | Admitting: Emergency Medicine

## 2015-02-21 DIAGNOSIS — Z862 Personal history of diseases of the blood and blood-forming organs and certain disorders involving the immune mechanism: Secondary | ICD-10-CM | POA: Diagnosis not present

## 2015-02-21 DIAGNOSIS — Q069 Congenital malformation of spinal cord, unspecified: Secondary | ICD-10-CM | POA: Insufficient documentation

## 2015-02-21 DIAGNOSIS — Z880 Allergy status to penicillin: Secondary | ICD-10-CM | POA: Insufficient documentation

## 2015-02-21 DIAGNOSIS — R079 Chest pain, unspecified: Secondary | ICD-10-CM | POA: Diagnosis not present

## 2015-02-21 DIAGNOSIS — Z7982 Long term (current) use of aspirin: Secondary | ICD-10-CM | POA: Diagnosis not present

## 2015-02-21 DIAGNOSIS — E559 Vitamin D deficiency, unspecified: Secondary | ICD-10-CM | POA: Diagnosis not present

## 2015-02-21 DIAGNOSIS — Z87891 Personal history of nicotine dependence: Secondary | ICD-10-CM | POA: Insufficient documentation

## 2015-02-21 DIAGNOSIS — Z79899 Other long term (current) drug therapy: Secondary | ICD-10-CM | POA: Diagnosis not present

## 2015-02-21 DIAGNOSIS — E039 Hypothyroidism, unspecified: Secondary | ICD-10-CM | POA: Diagnosis not present

## 2015-02-21 DIAGNOSIS — M1990 Unspecified osteoarthritis, unspecified site: Secondary | ICD-10-CM | POA: Insufficient documentation

## 2015-02-21 DIAGNOSIS — K219 Gastro-esophageal reflux disease without esophagitis: Secondary | ICD-10-CM | POA: Insufficient documentation

## 2015-02-21 DIAGNOSIS — Z7951 Long term (current) use of inhaled steroids: Secondary | ICD-10-CM | POA: Diagnosis not present

## 2015-02-21 DIAGNOSIS — R1013 Epigastric pain: Secondary | ICD-10-CM | POA: Insufficient documentation

## 2015-02-21 DIAGNOSIS — E785 Hyperlipidemia, unspecified: Secondary | ICD-10-CM | POA: Insufficient documentation

## 2015-02-21 DIAGNOSIS — R0789 Other chest pain: Secondary | ICD-10-CM | POA: Diagnosis not present

## 2015-02-21 DIAGNOSIS — I10 Essential (primary) hypertension: Secondary | ICD-10-CM | POA: Insufficient documentation

## 2015-02-21 LAB — COMPREHENSIVE METABOLIC PANEL
ALBUMIN: 3.8 g/dL (ref 3.5–5.0)
ALK PHOS: 97 U/L (ref 38–126)
ALT: 12 U/L — ABNORMAL LOW (ref 14–54)
AST: 21 U/L (ref 15–41)
Anion gap: 10 (ref 5–15)
BUN: 24 mg/dL — ABNORMAL HIGH (ref 6–20)
CO2: 22 mmol/L (ref 22–32)
CREATININE: 1.87 mg/dL — AB (ref 0.44–1.00)
Calcium: 8.9 mg/dL (ref 8.9–10.3)
Chloride: 102 mmol/L (ref 101–111)
GFR calc Af Amer: 29 mL/min — ABNORMAL LOW (ref >60)
GFR calc non Af Amer: 25 mL/min — ABNORMAL LOW (ref >60)
Glucose, Bld: 95 mg/dL (ref 65–99)
POTASSIUM: 4 mmol/L (ref 3.5–5.1)
Sodium: 134 mmol/L — ABNORMAL LOW (ref 135–145)
Total Bilirubin: 0.8 mg/dL (ref 0.3–1.2)
Total Protein: 7.2 g/dL (ref 6.5–8.1)

## 2015-02-21 LAB — CBC WITH DIFFERENTIAL/PLATELET
BASOS ABS: 0 10*3/uL (ref 0.0–0.1)
Basophils Relative: 0 % (ref 0–1)
EOS ABS: 0.1 10*3/uL (ref 0.0–0.7)
EOS PCT: 2 % (ref 0–5)
HCT: 31.1 % — ABNORMAL LOW (ref 36.0–46.0)
HEMOGLOBIN: 10.4 g/dL — AB (ref 12.0–15.0)
LYMPHS ABS: 2.4 10*3/uL (ref 0.7–4.0)
LYMPHS PCT: 46 % (ref 12–46)
MCH: 28.5 pg (ref 26.0–34.0)
MCHC: 33.4 g/dL (ref 30.0–36.0)
MCV: 85.2 fL (ref 78.0–100.0)
MONOS PCT: 8 % (ref 3–12)
Monocytes Absolute: 0.4 10*3/uL (ref 0.1–1.0)
Neutro Abs: 2.3 10*3/uL (ref 1.7–7.7)
Neutrophils Relative %: 44 % (ref 43–77)
Platelets: 194 10*3/uL (ref 150–400)
RBC: 3.65 MIL/uL — ABNORMAL LOW (ref 3.87–5.11)
RDW: 16.3 % — AB (ref 11.5–15.5)
WBC: 5.3 10*3/uL (ref 4.0–10.5)

## 2015-02-21 MED ORDER — MORPHINE SULFATE 4 MG/ML IJ SOLN
4.0000 mg | Freq: Once | INTRAMUSCULAR | Status: DC
  Filled 2015-02-21: qty 1

## 2015-02-21 NOTE — ED Notes (Signed)
Per EMS: htn problems per ems.  Called physician 3 days ago about htn, advised her to increased meds and she did but no improvement.  Ambulated when ems arrived, BP was 256/128 and c/o mild chest pressure that she described as "feels full like I have to burp."  20 gauge right hand.  Given 1 ntg, took 324 asa prior to ems arrival.  Some relief with ntg from cp.  After ntg, pressure decreased 158/110.  No changes based on which arm pressure was checked.  Denies any neuro deficits, no dizziness, no headache.  Has blood disease that she gets shots for, states her bone marrow doesn't produce blood as well as it should.  NRS and SB on monitor.

## 2015-02-21 NOTE — ED Provider Notes (Signed)
CSN: [id number]     Arrival date & time 02/21/15  2125 History   First MD Initiated Contact with Patient 02/21/15 2138     Chief Complaint  Patient presents with  . Hypertension  . Chest Pain   (Consider location/radiation/quality/duration/timing/severity/associated sxs/prior Treatment) HPI Susan [name] Day is a 74 yo presenting with report of chest fullness.  She states she had several days of high blood pressure readings at home.  She contacted her PCP who had her make some adjustments to her medicines, without significant improvement to her bp numbers.  Today, around 6 pm, she felt chest heaviness that was constant and rated as 8/10. She describes the pain as feeling like she needs to burp.  When EMS arrived, she belched which relieved some of the pressure. She currently rates her pain as 4/10.  She also reports at the time of the chest discomfort, she felt like both sides of her face were tightening up.  That has since resolved.  She denies any nausea, visual changes, unilateral weakness, or numbness, diaphoresis, or shortness of breath.  Past Medical History  Diagnosis Date  . Anemia   . Arthritis   . Myelodysplasia   . Hypertension   . HLD (hyperlipidemia)   . Hypothyroidism   . GERD (gastroesophageal reflux disease)   . Vitamin D deficiency    Past Surgical History  Procedure Laterality Date  . Umbilical hernia repair    . Rotator cuff repair Left   . Inguinal hernia repair    . Carpal tunnel release Right   . Dilation and curettage of uterus     Family History  Problem Relation Age of Onset  . Stroke Father   . Hypertension Father   . Stroke Sister   . Coronary artery disease Brother   . Hypertension Sister   . Bladder Cancer Sister   . Cancer Mother    History  Substance Use Topics  . Smoking status: Former Smoker    Types: Cigarettes    Quit date: 10/02/1996  . Smokeless tobacco: Never Used  . Alcohol Use: 0.0 oz/week     Comment: glass of wine at least once a  month.   OB History    No data available     Review of Systems  Constitutional: Negative for fever and chills.  HENT: Negative for sore throat.   Eyes: Negative for visual disturbance.  Respiratory: Positive for chest tightness. Negative for cough and shortness of breath.   Cardiovascular: Positive for chest pain. Negative for leg swelling.  Gastrointestinal: Negative for nausea, vomiting and diarrhea.  Genitourinary: Negative for dysuria.  Musculoskeletal: Negative for myalgias.  Skin: Negative for rash.  Neurological: Negative for weakness, numbness and headaches.      Allergies  Codeine; Penicillins; Bactrim; Clarithromycin; Flagyl; and Sulfa antibiotics  Home Medications   Prior to Admission medications   Medication Sig Start Date End Date Taking? Authorizing Provider  aspirin 81 MG chewable tablet Chew 81 mg by mouth every morning.     Historical Provider, MD  azithromycin (ZITHROMAX) 250 MG tablet Take 2 tablets (500 mg) on  Day 1,  followed by 1 tablet (250 mg) once daily on Days 2 through 5. 02/16/15   Unk Pinto, MD  Cholecalciferol (VITAMIN D-3) 5000 UNITS TABS Take 10,000 Units by mouth 2 (two) times daily.     Historical Provider, MD  Darbepoetin Alfa-Albumin (ARANESP IJ) Inject as directed every 21 ( twenty-one) days.     Historical Provider, MD  diclofenac (VOLTAREN) 75 MG EC tablet Take 1 tablet (75 mg total) by mouth 2 (two) times daily. 10/21/14   Jennifer Couillard, PA-C  HYDROcodone-acetaminophen (NORCO/VICODIN) 5-325 MG per tablet Take 1 tablet by mouth every 6 (six) hours as needed. For headaches 06/18/14   Kelby Aline, PA-C  ipratropium (ATROVENT) 0.03 % nasal spray Take 1 to 2 sprays into each nostril up to 2 or 3 x day as needed 01/06/15   Unk Pinto, MD  labetalol (NORMODYNE) 300 MG tablet Take 1 tablet (300 mg total) by mouth 2 (two) times daily. 10/21/14   Jennifer Couillard, PA-C  levothyroxine (SYNTHROID, LEVOTHROID) 100 MCG tablet Take 1 tablet  (100 mcg total) by mouth as directed. Patient taking differently: Take 100 mcg by mouth as directed. Patient takes 1 tab 6 days a week and none on Sunday 10/21/14   Dewayne Shorter, PA-C  losartan-hydrochlorothiazide [facility]) 100-25 MG per tablet Take 1 tablet daily for BP 01/06/15 07/09/15  Unk Pinto, MD  montelukast (SINGULAIR) 10 MG tablet Take 1 tablet daily for allergies 02/16/15 03/19/15  Unk Pinto, MD  pantoprazole (PROTONIX) 40 MG tablet TAKE ONE TABLET BY MOUTH ONCE DAILY FOR  ACID  REFLUX 12/17/14   Unk Pinto, MD  pravastatin (PRAVACHOL) 20 MG tablet Take 20 mg by mouth daily. 11/21/14   Historical Provider, MD   BP 157/81 mmHg  Pulse 63  Temp(Src) 98 F (36.7 C) (Oral)  Resp 18  Ht 5' 2.5" (1.588 m)  Wt 173 lb (78.472 kg)  BMI 31.12 kg/m2  SpO2 99% Physical Exam  Constitutional: She appears well-developed and well-nourished. No distress.  HENT:  Head: Normocephalic and atraumatic.  Mouth/Throat: Oropharynx is clear and moist.  Eyes: Conjunctivae are normal.  Neck: Neck supple.  Cardiovascular: Normal rate, regular rhythm and intact distal pulses.   Pulmonary/Chest: Effort normal and breath sounds normal. No respiratory distress. She has no wheezes. She has no rales. She exhibits tenderness.    Abdominal: Soft. She exhibits no distension and no mass. There is tenderness in the epigastric area. There is no rebound and no guarding.  Musculoskeletal: She exhibits no tenderness.  Lymphadenopathy:    She has no cervical adenopathy.  Neurological: She is alert.  Skin: Skin is warm and dry. No rash noted. She is not diaphoretic.  Psychiatric: She has a normal mood and affect.  Nursing note and vitals reviewed.   ED Course  Procedures (including critical care time) Labs Review Labs Reviewed  COMPREHENSIVE METABOLIC PANEL - Abnormal; Notable for the following:    Sodium 134 (*)    BUN 24 (*)    Creatinine, Ser 1.87 (*)    ALT 12 (*)    GFR calc non Af Amer  25 (*)    GFR calc Af Amer 29 (*)    All other components within normal limits  CBC WITH DIFFERENTIAL/PLATELET - Abnormal; Notable for the following:    RBC 3.65 (*)    Hemoglobin 10.4 (*)    HCT 31.1 (*)    RDW 16.3 (*)    All other components within normal limits  I-STAT TROPOININ, ED  Randolm Idol, ED    Imaging Review Dg Chest 2 View  02/22/2015   CLINICAL DATA:  Chest pain and hypertension.  EXAM: CHEST  2 VIEW  COMPARISON:  06/19/2012  FINDINGS: Heart at the upper limits normal in size, unchanged tortuosity of the thoracic aorta. Borderline hyperinflation with mild prominent interstitial markings. No confluent airspace disease, pulmonary edema, pleural effusion or  pneumothorax. Linear atelectasis or scarring in the right and left midlung zone. No acute osseous abnormalities seen.  IMPRESSION: Borderline hyperinflation without localizing pulmonary process.   Electronically Signed   By: Jeb Levering M.D.   On: 02/22/2015 00:11     EKG Interpretation   Date/Time:  Saturday Feb 21 2015 21:33:26 EDT Ventricular Rate:  59 PR Interval:  217 QRS Duration: 90 QT Interval:  457 QTC Calculation: 453 R Axis:   36 Text Interpretation:  Sinus rhythm Borderline prolonged PR interval  Confirmed by Hazle Coca [phone]) on 02/21/2015 9:39:28 PM      MDM   Final diagnoses:  Chest pain, unspecified chest pain type   74 yo with chest fullness, worse when laying down, relieved by burping.  Discussed case with Dr. Ralene Bathe. Chest pain is not likely of cardiac or pulmonary etiology d/t presentation, no new murmur, RRR, breath sounds equal bilaterally, EKG without acute abnormalities, negative troponin and delta troponin, and negative CXR. Pain resolved in the ED. Patient is to be discharged with recommendation to follow up with PCP in regards to today's hospital visit. Discussed to return to the ED if CP becomes exertional, associated with diaphoresis or nausea, radiates to left jaw/arm, worsens  or becomes concerning in any way.  Pt is well-appearing, in no acute distress and vital signs reviewed and not concerning. She appears safe to be discharged.  Discharge include follow-up with their PCP.  Return precautions provided. Pt aware of plan and in agreement.    Filed Vitals:   02/22/15 0045 02/22/15 0115 02/22/15 0145 02/22/15 0212  BP: 170/80 167/75 152/71 147/69  Pulse: 59 74 66 68  Temp:    98 F (36.7 C)  TempSrc:    Oral  Resp: 16 21 17 16   Height:      Weight:      SpO2: 99% 98% 97% 98%   Meds given in ED:  Medications - No data to display  Discharge Medication List as of 02/22/2015  1:45 AM         Britt Bottom, NP 02/23/15 [id number]  Quintella Reichert, MD 02/27/15 1642

## 2015-02-21 NOTE — ED Notes (Signed)
Dr. Ralene Bathe at the bedside.

## 2015-02-21 NOTE — ED Notes (Signed)
Phlebotomy at the bedside  

## 2015-02-22 DIAGNOSIS — R079 Chest pain, unspecified: Secondary | ICD-10-CM | POA: Diagnosis not present

## 2015-02-22 LAB — I-STAT TROPONIN, ED
TROPONIN I, POC: 0 ng/mL (ref 0.00–0.08)
Troponin i, poc: 0.01 ng/mL (ref 0.00–0.08)

## 2015-02-22 NOTE — ED Notes (Signed)
Clarified with beth, np. Original troponin was drawn at 2219. 3 hour draw scheduled for 1am.

## 2015-02-22 NOTE — ED Notes (Signed)
Patient provided meal after speaking with beth, NP.

## 2015-02-22 NOTE — Discharge Instructions (Signed)
Please follow the directions provided. Be sure to follow-up with your primary care doctor to make sure you're getting better. Continue to take all medicines as prescribed. Don't hesitate to return for any new, worsening, or concerning symptoms.    SEEK IMMEDIATE MEDICAL CARE IF:  You have increased chest pain or pain that spreads to your arm, neck, jaw, back, or abdomen.  You have shortness of breath.  You have an increasing cough, or you cough up blood.  You have severe back or abdominal pain.  You feel nauseous or vomit.  You have severe weakness.  You faint.  You have chills. This is an emergency. Do not wait to see if the pain will go away. Get medical help at once. Call your local emergency services (911 in [place] Do not drive yourself to the hospital.

## 2015-02-22 NOTE — ED Notes (Signed)
Updated niece on plan of care.

## 2015-02-24 ENCOUNTER — Encounter: Payer: Self-pay | Admitting: Internal Medicine

## 2015-02-24 ENCOUNTER — Ambulatory Visit (INDEPENDENT_AMBULATORY_CARE_PROVIDER_SITE_OTHER): Payer: Medicare Other | Admitting: Internal Medicine

## 2015-02-24 VITALS — BP 158/88 | HR 72 | Temp 97.0°F | Resp 18 | Ht 63.0 in | Wt 174.8 lb

## 2015-02-24 DIAGNOSIS — N183 Chronic kidney disease, stage 3 unspecified: Secondary | ICD-10-CM

## 2015-02-24 DIAGNOSIS — Z1212 Encounter for screening for malignant neoplasm of rectum: Secondary | ICD-10-CM

## 2015-02-24 DIAGNOSIS — R7309 Other abnormal glucose: Secondary | ICD-10-CM | POA: Diagnosis not present

## 2015-02-24 DIAGNOSIS — E559 Vitamin D deficiency, unspecified: Secondary | ICD-10-CM

## 2015-02-24 DIAGNOSIS — E039 Hypothyroidism, unspecified: Secondary | ICD-10-CM | POA: Diagnosis not present

## 2015-02-24 DIAGNOSIS — E782 Mixed hyperlipidemia: Secondary | ICD-10-CM | POA: Diagnosis not present

## 2015-02-24 DIAGNOSIS — Z79899 Other long term (current) drug therapy: Secondary | ICD-10-CM | POA: Diagnosis not present

## 2015-02-24 DIAGNOSIS — D469 Myelodysplastic syndrome, unspecified: Secondary | ICD-10-CM

## 2015-02-24 DIAGNOSIS — R7303 Prediabetes: Secondary | ICD-10-CM

## 2015-02-24 DIAGNOSIS — I10 Essential (primary) hypertension: Secondary | ICD-10-CM

## 2015-02-24 DIAGNOSIS — Z9181 History of falling: Secondary | ICD-10-CM

## 2015-02-24 DIAGNOSIS — Z1331 Encounter for screening for depression: Secondary | ICD-10-CM

## 2015-02-24 DIAGNOSIS — K219 Gastro-esophageal reflux disease without esophagitis: Secondary | ICD-10-CM

## 2015-02-24 MED ORDER — ISOSORBIDE MONONITRATE ER 60 MG PO TB24: ORAL_TABLET | ORAL | Status: DC

## 2015-02-24 MED ORDER — LEVOTHYROXINE SODIUM 100 MCG PO TABS: ORAL_TABLET | ORAL | Status: DC

## 2015-02-24 NOTE — Patient Instructions (Addendum)
Recommend Adult Low dose Aspirin or baby Aspirin 81 mg daily   To reduce risk of Colon Cancer 20 %,   Skin Cancer 26 % ,   Melanoma 46%   and   Pancreatic cancer 60%  ++++++++++++++++++  Vitamin D goal is between 70-100.   Please make sure that you are taking your Vitamin D as directed.   It is very important as a natural anti-inflammatory   helping hair, skin, and nails, as well as reducing stroke and heart attack risk.   It helps your bones and helps with mood.  It also decreases numerous cancer risks so please take it as directed.   Low Vit D is associated with a 200-300% higher risk for CANCER   and 200-300% higher risk for HEART   ATTACK  &  STROKE.    ......................................  It is also associated with higher death rate at younger ages,   autoimmune diseases like Rheumatoid arthritis, Lupus, Multiple Sclerosis.     Also many other serious conditions, like depression, Alzheimer's  Dementia, infertility, muscle aches, fatigue, fibromyalgia - just to name a few.  +++++++++++++++++++    Recommend the book "The END of DIETING" by Dr Joel Fuhrman   & the book "The END of DIABETES " by Dr Joel Fuhrman  At [employer] - get book & Audio CD's     Being diabetic has a  300% increased risk for heart attack, stroke, cancer, and alzheimer- type vascular dementia. It is very important that you work harder with diet by avoiding all foods that are white. Avoid white rice (brown & wild rice is OK), white potatoes (sweetpotatoes in moderation is OK), White bread or wheat bread or anything made out of white flour like bagels, donuts, rolls, buns, biscuits, cakes, pastries, cookies, pizza crust, and pasta (made from white flour & egg whites) - vegetarian pasta or spinach or wheat pasta is OK. Multigrain breads like Arnold's or [employer], or multigrain sandwich thins or flatbreads.  Diet, exercise and weight loss can reverse and cure diabetes in the early  stages.  Diet, exercise and weight loss is very important in the control and prevention of complications of diabetes which affects every system in your body, ie. Brain - dementia/stroke, eyes - glaucoma/blindness, heart - heart attack/heart failure, kidneys - dialysis, stomach - gastric paralysis, intestines - malabsorption, nerves - severe painful neuritis, circulation - gangrene & loss of a leg(s), and finally cancer and Alzheimers.    I recommend avoid fried & greasy foods,  sweets/candy, white rice (brown or wild rice or Quinoa is OK), white potatoes (sweet potatoes are OK) - anything made from white flour - bagels, doughnuts, rolls, buns, biscuits,white and wheat breads, pizza crust and traditional pasta made of white flour & egg white(vegetarian pasta or spinach or wheat pasta is OK).  Multi-grain bread is OK - like multi-grain flat bread or sandwich thins. Avoid alcohol in excess. Exercise is also important.    Eat all the vegetables you want - avoid meat, especially red meat and dairy - especially cheese.  Cheese is the most concentrated form of trans-fats which is the worst thing to clog up our arteries. Veggie cheese is OK which can be found in the fresh produce section at [employer] or [employer] or [employer]  ++++++++++++++++++++++++++  Preventive Care for Adults  A healthy lifestyle and preventive care can promote health and wellness. Preventive health guidelines for women include the following key practices.  A routine yearly physical is   a good way to check with your health care provider about your health and preventive screening. It is a chance to share any concerns and updates on your health and to receive a thorough exam.  Visit your dentist for a routine exam and preventive care every 6 months. Brush your teeth twice a day and floss once a day. Good oral hygiene prevents tooth decay and gum disease.  The frequency of eye exams is based on your age, health, family medical  history, use of contact lenses, and other factors. Follow your health care provider's recommendations for frequency of eye exams.  Eat a healthy diet. Foods like vegetables, fruits, whole grains, low-fat dairy products, and lean protein foods contain the nutrients you need without too many calories. Decrease your intake of foods high in solid fats, added sugars, and salt. Eat the right amount of calories for you.Get information about a proper diet from your health care provider, if necessary.  Regular physical exercise is one of the most important things you can do for your health. Most adults should get at least 150 minutes of moderate-intensity exercise (any activity that increases your heart rate and causes you to sweat) each week. In addition, most adults need muscle-strengthening exercises on 2 or more days a week.  Maintain a healthy weight. The body mass index (BMI) is a screening tool to identify possible weight problems. It provides an estimate of body fat based on height and weight. Your health care provider can find your BMI and can help you achieve or maintain a healthy weight.For adults 20 years and older:  A BMI below 18.5 is considered underweight.  A BMI of 18.5 to 24.9 is normal.  A BMI of 25 to 29.9 is considered overweight.  A BMI of 30 and above is considered obese.  Maintain normal blood lipids and cholesterol levels by exercising and minimizing your intake of saturated fat. Eat a balanced diet with plenty of fruit and vegetables. If your lipid or cholesterol levels are high, you are over 50, or you are at high risk for heart disease, you may need your cholesterol levels checked more frequently.Ongoing high lipid and cholesterol levels should be treated with medicines if diet and exercise are not working.  If you smoke, find out from your health care provider how to quit. If you do not use tobacco, do not start.  Lung cancer screening is recommended for adults aged 55-80  years who are at high risk for developing lung cancer because of a history of smoking. A yearly low-dose CT scan of the lungs is recommended for people who have at least a 30-pack-year history of smoking and are a current smoker or have quit within the past 15 years. A pack year of smoking is smoking an average of 1 pack of cigarettes a day for 1 year (for example: 1 pack a day for 30 years or 2 packs a day for 15 years). Yearly screening should continue until the smoker has stopped smoking for at least 15 years. Yearly screening should be stopped for people who develop a health problem that would prevent them from having lung cancer treatment.  Avoid use of street drugs. Do not share needles with anyone. Ask for help if you need support or instructions about stopping the use of drugs.  High blood pressure causes heart disease and increases the risk of stroke.  Ongoing high blood pressure should be treated with medicines if weight loss and exercise do not work.  If   you are 55-79 years old, ask your health care provider if you should take aspirin to prevent strokes.  Diabetes screening involves taking a blood sample to check your fasting blood sugar level. This should be done once every 3 years, after age 45, if you are within normal weight and without risk factors for diabetes. Testing should be considered at a younger age or be carried out more frequently if you are overweight and have at least 1 risk factor for diabetes.  Breast cancer screening is essential preventive care for women. You should practice "breast self-awareness." This means understanding the normal appearance and feel of your breasts and may include breast self-examination. Any changes detected, no matter how small, should be reported to a health care provider. Women in their 20s and 30s should have a clinical breast exam (CBE) by a health care provider as part of a regular health exam every 1 to 3 years. After age 40, women should have a  CBE every year. Starting at age 40, women should consider having a mammogram (breast X-ray test) every year. Women who have a family history of breast cancer should talk to their health care provider about genetic screening. Women at a high risk of breast cancer should talk to their health care providers about having an MRI and a mammogram every year.  Breast cancer gene (BRCA)-related cancer risk assessment is recommended for women who have family members with BRCA-related cancers. BRCA-related cancers include breast, ovarian, tubal, and peritoneal cancers. Having family members with these cancers may be associated with an increased risk for harmful changes (mutations) in the breast cancer genes BRCA1 and BRCA2. Results of the assessment will determine the need for genetic counseling and BRCA1 and BRCA2 testing.  Routine pelvic exams to screen for cancer are no longer recommended for nonpregnant women who are considered low risk for cancer of the pelvic organs (ovaries, uterus, and vagina) and who do not have symptoms. Ask your health care provider if a screening pelvic exam is right for you.  If you have had past treatment for cervical cancer or a condition that could lead to cancer, you need Pap tests and screening for cancer for at least 20 years after your treatment. If Pap tests have been discontinued, your risk factors (such as having a new sexual partner) need to be reassessed to determine if screening should be resumed. Some women have medical problems that increase the chance of getting cervical cancer. In these cases, your health care provider may recommend more frequent screening and Pap tests.    Colorectal cancer can be detected and often prevented. Most routine colorectal cancer screening begins at the age of 50 years and continues through age 75 years. However, your health care provider may recommend screening at an earlier age if you have risk factors for colon cancer. On a yearly basis,  your health care provider may provide home test kits to check for hidden blood in the stool. Use of a small camera at the end of a tube, to directly examine the colon (sigmoidoscopy or colonoscopy), can detect the earliest forms of colorectal cancer. Talk to your health care provider about this at age 50, when routine screening begins. Direct exam of the colon should be repeated every 5-10 years through age 75 years, unless early forms of pre-cancerous polyps or small growths are found.  Osteoporosis is a disease in which the bones lose minerals and strength with aging. This can result in serious bone fractures or breaks. The   risk of osteoporosis can be identified using a bone density scan. Women ages 65 years and over and women at risk for fractures or osteoporosis should discuss screening with their health care providers. Ask your health care provider whether you should take a calcium supplement or vitamin D to reduce the rate of osteoporosis.  Menopause can be associated with physical symptoms and risks. Hormone replacement therapy is available to decrease symptoms and risks. You should talk to your health care provider about whether hormone replacement therapy is right for you.  Use sunscreen. Apply sunscreen liberally and repeatedly throughout the day. You should seek shade when your shadow is shorter than you. Protect yourself by wearing long sleeves, pants, a wide-brimmed hat, and sunglasses year round, whenever you are outdoors.  Once a month, do a whole body skin exam, using a mirror to look at the skin on your back. Tell your health care provider of new moles, moles that have irregular borders, moles that are larger than a pencil eraser, or moles that have changed in shape or color.  Stay current with required vaccines (immunizations).  Influenza vaccine. All adults should be immunized every year.  Tetanus, diphtheria, and acellular pertussis (Td, Tdap) vaccine. Pregnant women should receive  1 dose of Tdap vaccine during each pregnancy. The dose should be obtained regardless of the length of time since the last dose. Immunization is preferred during the 27th-36th week of gestation. An adult who has not previously received Tdap or who does not know her vaccine status should receive 1 dose of Tdap. This initial dose should be followed by tetanus and diphtheria toxoids (Td) booster doses every 10 years. Adults with an unknown or incomplete history of completing a 3-dose immunization series with Td-containing vaccines should begin or complete a primary immunization series including a Tdap dose. Adults should receive a Td booster every 10 years.    Zoster vaccine. One dose is recommended for adults aged 60 years or older unless certain conditions are present.    Pneumococcal 13-valent conjugate (PCV13) vaccine. When indicated, a person who is uncertain of her immunization history and has no record of immunization should receive the PCV13 vaccine. An adult aged 19 years or older who has certain medical conditions and has not been previously immunized should receive 1 dose of PCV13 vaccine. This PCV13 should be followed with a dose of pneumococcal polysaccharide (PPSV23) vaccine. The PPSV23 vaccine dose should be obtained at least 8 weeks after the dose of PCV13 vaccine. An adult aged 19 years or older who has certain medical conditions and previously received 1 or more doses of PPSV23 vaccine should receive 1 dose of PCV13. The PCV13 vaccine dose should be obtained 1 or more years after the last PPSV23 vaccine dose.    Pneumococcal polysaccharide (PPSV23) vaccine. When PCV13 is also indicated, PCV13 should be obtained first. All adults aged 65 years and older should be immunized. An adult younger than age 65 years who has certain medical conditions should be immunized. Any person who resides in a nursing home or long-term care facility should be immunized. An adult smoker should be immunized.  People with an immunocompromised condition and certain other conditions should receive both PCV13 and PPSV23 vaccines. People with human immunodeficiency virus (HIV) infection should be immunized as soon as possible after diagnosis. Immunization during chemotherapy or radiation therapy should be avoided. Routine use of PPSV23 vaccine is not recommended for American Indians, [place] Natives, or people younger than 65 years unless there are medical   conditions that require PPSV23 vaccine. When indicated, people who have unknown immunization and have no record of immunization should receive PPSV23 vaccine. One-time revaccination 5 years after the first dose of PPSV23 is recommended for people aged 19-64 years who have chronic kidney failure, nephrotic syndrome, asplenia, or immunocompromised conditions. People who received 1-2 doses of PPSV23 before age 65 years should receive another dose of PPSV23 vaccine at age 65 years or later if at least 5 years have passed since the previous dose. Doses of PPSV23 are not needed for people immunized with PPSV23 at or after age 65 years.   Preventive Services / Frequency  Ages 65 years and over  Blood pressure check.  Lipid and cholesterol check.  Lung cancer screening. / Every year if you are aged 55-80 years and have a 30-pack-year history of smoking and currently smoke or have quit within the past 15 years. Yearly screening is stopped once you have quit smoking for at least 15 years or develop a health problem that would prevent you from having lung cancer treatment.  Clinical breast exam.** / Every year after age 40 years.  BRCA-related cancer risk assessment.** / For women who have family members with a BRCA-related cancer (breast, ovarian, tubal, or peritoneal cancers).  Mammogram.** / Every year beginning at age 40 years and continuing for as long as you are in good health. Consult with your health care provider.  Pap test.** / Every 3 years starting at  age 30 years through age 65 or 70 years with 3 consecutive normal Pap tests. Testing can be stopped between 65 and 70 years with 3 consecutive normal Pap tests and no abnormal Pap or HPV tests in the past 10 years.  Fecal occult blood test (FOBT) of stool. / Every year beginning at age 50 years and continuing until age 75 years. You may not need to do this test if you get a colonoscopy every 10 years.  Flexible sigmoidoscopy or colonoscopy.** / Every 5 years for a flexible sigmoidoscopy or every 10 years for a colonoscopy beginning at age 50 years and continuing until age 75 years.  Hepatitis C blood test.** / For all people born from 1945 through 1965 and any individual with known risks for hepatitis C.  Osteoporosis screening.** / A one-time screening for women ages 65 years and over and women at risk for fractures or osteoporosis.  Skin self-exam. / Monthly.  Influenza vaccine. / Every year.  Tetanus, diphtheria, and acellular pertussis (Tdap/Td) vaccine.** / 1 dose of Td every 10 years.  Zoster vaccine.** / 1 dose for adults aged 60 years or older.  Pneumococcal 13-valent conjugate (PCV13) vaccine.** / Consult your health care provider.  Pneumococcal polysaccharide (PPSV23) vaccine.** / 1 dose for all adults aged 65 years and older. Screening for abdominal aortic aneurysm (AAA)  by ultrasound is recommended for people who have history of high blood pressure or who are current or former smokers. 

## 2015-02-24 NOTE — Progress Notes (Signed)
Patient ID: Susan [name], female   DOB: Feb 06, 1941, 74 y.o.   MRN: [id number]  Annual Comprehensive Examination  This very nice 74 y.o. WBF presents for complete physical.  Patient has been followed for HTN, Prediabetes, Hyperlipidemia, and Vitamin D Deficiency.     Patient's HTN predates back from the 1980's and has been very labile and just 2 days ago she presented to the ER reporting a systolic BP of [id number]  and when rechecked at the ER it was 170/80. She was given Sl TNG with further drop of BP almost back to Normal.  When patient was felt stable cardiac wise, she was released home.     She relates her fatigue and malaise to her Losartan so she stopped it.  For this reason she is reluctant restart her Losartan.   Patient denies any cardiac symptoms as chest pain, palpitations, shortness of breath, dizziness or ankle swelling. Today's BP is elevated at 158/80 by the nurse and at 160 /90 by my recheck.     Other problems include dx/o MDS with chronic anemia for which she's followed every 3 weeks in concern for Aranesp injections. She also has CKD3 with GFRof 32 ml/min.     Patient's hyperlipidemia is controlled with diet and medications. Patient denies myalgias or other medication SE's. Last lipids were at goal - Total  Chol145; HDL 61; LDL 65; Triglycerides 96 on 11/03/2014.    Patient has Morbid Obesity (BMI 31)  and consequent prediabetes and patient denies reactive hypoglycemic symptoms, visual blurring, diabetic polys, or paresthesias. Last A1c was  4.6% on 11/03/2014.     Finally, patient has history of Vitamin D Deficiency and last Vitamin D was 78 on  11/03/2014.      Medication Sig  . aspirin 81 MG chewable tablet Chew 81 mg by mouth every morning.   . Cholecalciferol (VITAMIN D-3) 5000 UNITS TABS Take 10,000 Units by mouth 2 (two) times daily.   . Darbepoetin Alfa-Albumin (ARANESP IJ) Inject as directed every 21 ( twenty-one) days.   . diclofenac (VOLTAREN) 75 MG EC tablet Take 1 tablet (75  mg total) by mouth 2 (two) times daily.  [name] ipratropium (ATROVENT) 0.03 % nasal spray Take 1 to 2 sprays into each nostril up to 2 or 3 x day as needed  . labetalol (NORMODYNE) 300 MG tablet Take 1 tablet (300 mg total) by mouth 2 (two) times daily.  [name] losartan-hctz 100-25 MG per tablet Take 1 tablet daily for BP  . pantoprazole (PROTONIX) 40 MG tablet TAKE ONE TABLET BY MOUTH ONCE DAILY FOR  ACID  REFLUX  . pravastatin (PRAVACHOL) 20 MG tablet Take 20 mg by mouth daily.  [name] azithromycin (ZITHROMAX) 250 MG tablet Take 2 tablets (500 mg) on  Day 1,  followed by 1 tablet (250 mg) once daily on Days 2 through 5. (Patient not taking: Reported on 02/22/2015)  . HYDROcodone-acetaminophen (NORCO/VICODIN) 5-325 MG per tablet Take 1 tablet by mouth every 6 (six) hours as needed. For headaches (Patient not taking: Reported on 02/22/2015)  . levothyroxine (SYNTHROID, LEVOTHROID) 100 MCG tablet Take 1 tablet (100 mcg total) by mouth as directed. (Patient taking differently: Take 100 mcg by mouth daily before breakfast. )  . montelukast (SINGULAIR) 10 MG tablet Take 1 tablet daily for allergies (Patient not taking: Reported on 02/22/2015)   Allergies  Allergen Reactions  . Codeine Anaphylaxis  . Penicillins Anaphylaxis  . Bactrim Itching and Swelling  . Clarithromycin Other (See Comments)    "  Caused skin to peel off my hand."  . Flagyl [Metronidazole Hcl] Itching and Swelling  . Sulfa Antibiotics Itching and Swelling   Past Medical History  Diagnosis Date  . Anemia   . Arthritis   . Myelodysplasia   . Hypertension   . HLD (hyperlipidemia)   . Hypothyroidism   . GERD (gastroesophageal reflux disease)   . Vitamin D deficiency    Health Maintenance  Topic Date Due  . DEXA SCAN  07/18/2004  . TETANUS/TDAP  10/04/2015  . COLONOSCOPY  10/03/2016  . ZOSTAVAX  Addressed   Immunization History  Administered Date(s) Administered  . Td 10/03/2005   Past Surgical History  Procedure Laterality Date  .  Umbilical hernia repair    . Rotator cuff repair Left   . Inguinal hernia repair    . Carpal tunnel release Right   . Dilation and curettage of uterus     Family History  Problem Relation Age of Onset  . Stroke Father   . Hypertension Father   . Stroke Sister   . Coronary artery disease Brother   . Hypertension Sister   . Bladder Cancer Sister   . Cancer Mother    History  Substance Use Topics  . Smoking status: Former Smoker    Types: Cigarettes    Quit date: 10/02/1996  . Smokeless tobacco: Never Used  . Alcohol Use: 0.0 oz/week     Comment: glass of wine at least once a month.    ROS Constitutional: Denies fever, chills, weight loss/gain, headaches, insomnia,  night sweats, and change in appetite. Does c/o fatigue. Eyes: Denies redness, blurred vision, diplopia, discharge, itchy, watery eyes.  ENT: Denies discharge, congestion, post nasal drip, epistaxis, sore throat, earache, hearing loss, dental pain, Tinnitus, Vertigo, Sinus pain, snoring.  Cardio: Denies chest pain, palpitations, irregular heartbeat, syncope, dyspnea, diaphoresis, orthopnea, PND, claudication, edema Respiratory: denies cough, dyspnea, DOE, pleurisy, hoarseness, laryngitis, wheezing.  Gastrointestinal: Denies dysphagia, heartburn, reflux, water brash, pain, cramps, nausea, vomiting, bloating, diarrhea, constipation, hematemesis, melena, hematochezia, jaundice, hemorrhoids Genitourinary: Denies dysuria, frequency, urgency, nocturia, hesitancy, discharge, hematuria, flank pain Breast: Breast lumps, nipple discharge, bleeding.  Musculoskeletal: Denies arthralgia, myalgia, stiffness, Jt. Swelling, pain, limp, and strain/sprain. Denies falls. Skin: Denies puritis, rash, hives, warts, acne, eczema, changing in skin lesion Neuro: No weakness, tremor, incoordination, spasms, paresthesia, pain Psychiatric: Denies confusion, memory loss, sensory loss. Denies Depression. Endocrine: Denies change in weight, skin, hair  change, nocturia, and paresthesia, diabetic polys, visual blurring, hyper / hypo glycemic episodes.  Heme/Lymph: No excessive bleeding, bruising, enlarged lymph nodes.  Physical Exam  BP 158/88   Pulse 72  Temp 97 F   Resp 18  Ht 5\' 3"    Wt 174 lb 12.8 oz     BMI 30.97   General Appearance: Well nourished and in no apparent distress. Eyes: PERRLA, EOMs, conjunctiva no swelling or erythema, normal fundi and vessels. Sinuses: No frontal/maxillary tenderness ENT/Mouth: EACs patent / TMs  nl. Nares clear without erythema, swelling, mucoid exudates. Oral hygiene is good. No erythema, swelling, or exudate. Tongue normal, non-obstructing. Tonsils not swollen or erythematous. Hearing normal.  Neck: Supple, thyroid normal. No bruits, nodes or JVD. Respiratory: Respiratory effort normal.  BS equal and clear bilateral without rales, rhonci, wheezing or stridor. Cardio: Heart sounds are normal with regular rate and rhythm and no murmurs, rubs or gallops. Peripheral pulses are normal and equal bilaterally without edema. No aortic or femoral bruits. Chest: symmetric with normal excursions and percussion. Breasts: Symmetric,  without lumps, nipple discharge, retractions, or fibrocystic changes.  Abdomen: Flat, soft, with bowl sounds. Nontender, no guarding, rebound, hernias, masses, or organomegaly.  Lymphatics: Non tender without lymphadenopathy.  Genitourinary:  Musculoskeletal: Full ROM all peripheral extremities, joint stability, 5/5 strength, and normal gait. Skin: Warm and dry without rashes, lesions, cyanosis, clubbing or  ecchymosis.  Neuro: Cranial nerves intact, reflexes equal bilaterally. Normal muscle tone, no cerebellar symptoms. Sensation intact.  Pysch: Awake and oriented X 3, normal affect, Insight and Judgment appropriate.   Assessment and Plan  1. Essential hypertension  - Microalbumin / creatinine urine ratio - [place], RETROPERITNL ABD,  LTD - isosorbide mononitrate (IMDUR) 60 MG  24 hr tablet; Take 1/2 to 1 tablet daily for BP  Dispense: 90 tablet; Refill: 1  2. Hyperlipidemia  - Lipid panel  3. Prediabetes  - Hemoglobin A1c - Insulin, random  4. Vitamin D deficiency  - Vit D  25 hydroxy (rtn osteoporosis monitoring)  5. Hypothyroidism, unspecified hypothyroidism type  - TSH - levothyroxine (SYNTHROID) 100 MCG tablet; Take 1 tablet daily on an empty stomach for 30 minutes  Dispense: 90 tablet; Refill: 1  6. Gastroesophageal reflux disease, esophagitis presence not specified   7. 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 MDS (myelodysplastic syndrome)   9. Screening for rectal cancer  - POC Hemoccult Bld/Stl (3-Cd Home Screen); Future  10. Depression screen   11. At low risk for fall   12. Medication management  - Urine Microscopic - BASIC METABOLIC PANEL WITH GFR - Hepatic function panel - Magnesium   Continue prudent diet as discussed, weight control, BP monitoring, regular exercise, and medications. Discussed med's effects and SE's. Screening labs and tests as requested with regular follow-up as recommended.  Over 40 minutes of exam, counseling, chart review was performed.

## 2015-02-25 LAB — BASIC METABOLIC PANEL WITH GFR
BUN: 27 mg/dL — AB (ref 6–23)
CO2: 24 meq/L (ref 19–32)
Calcium: 9.3 mg/dL (ref 8.4–10.5)
Chloride: 106 mEq/L (ref 96–112)
Creat: 1.79 mg/dL — ABNORMAL HIGH (ref 0.50–1.10)
GFR, Est African American: 32 mL/min — ABNORMAL LOW
GFR, Est Non African American: 27 mL/min — ABNORMAL LOW
GLUCOSE: 133 mg/dL — AB (ref 70–99)
POTASSIUM: 4.2 meq/L (ref 3.5–5.3)
Sodium: 141 mEq/L (ref 135–145)

## 2015-02-25 LAB — HEMOGLOBIN A1C
Hgb A1c MFr Bld: 4.7 % (ref <5.7)
MEAN PLASMA GLUCOSE: 88 mg/dL (ref <117)

## 2015-02-25 LAB — HEPATIC FUNCTION PANEL
ALBUMIN: 4.1 g/dL (ref 3.5–5.2)
ALT: 10 U/L (ref 0–35)
AST: 17 U/L (ref 0–37)
Alkaline Phosphatase: 98 U/L (ref 39–117)
BILIRUBIN DIRECT: 0.1 mg/dL (ref 0.0–0.3)
BILIRUBIN TOTAL: 0.6 mg/dL (ref 0.2–1.2)
Indirect Bilirubin: 0.5 mg/dL (ref 0.2–1.2)
Total Protein: 6.9 g/dL (ref 6.0–8.3)

## 2015-02-25 LAB — URINALYSIS, MICROSCOPIC ONLY
Bacteria, UA: NONE SEEN
Casts: NONE SEEN
Crystals: NONE SEEN
Squamous Epithelial / LPF: NONE SEEN

## 2015-02-25 LAB — MICROALBUMIN / CREATININE URINE RATIO
CREATININE, URINE: 173 mg/dL
MICROALB UR: 7.4 mg/dL — AB (ref <2.0)
MICROALB/CREAT RATIO: 42.8 mg/g — AB (ref 0.0–30.0)

## 2015-02-25 LAB — LIPID PANEL
Cholesterol: 161 mg/dL (ref 0–200)
HDL: 55 mg/dL (ref >=46)
LDL CALC: 78 mg/dL (ref 0–99)
Total CHOL/HDL Ratio: 2.9 Ratio
Triglycerides: 142 mg/dL (ref <150)
VLDL: 28 mg/dL (ref 0–40)

## 2015-02-25 LAB — TSH: TSH: 2.041 u[IU]/mL (ref 0.350–4.500)

## 2015-02-25 LAB — MAGNESIUM: Magnesium: 1.7 mg/dL (ref 1.5–2.5)

## 2015-02-25 LAB — INSULIN, RANDOM: INSULIN: 64.8 u[IU]/mL — AB (ref 2.0–19.6)

## 2015-02-25 LAB — VITAMIN D 25 HYDROXY (VIT D DEFICIENCY, FRACTURES): Vit D, 25-Hydroxy: 72 ng/mL (ref 30–100)

## 2015-03-05 ENCOUNTER — Telehealth: Payer: Self-pay | Admitting: *Deleted

## 2015-03-05 ENCOUNTER — Other Ambulatory Visit: Payer: Self-pay | Admitting: Physician Assistant

## 2015-03-05 ENCOUNTER — Other Ambulatory Visit: Payer: Self-pay | Admitting: Internal Medicine

## 2015-03-05 DIAGNOSIS — I10 Essential (primary) hypertension: Secondary | ICD-10-CM

## 2015-03-05 NOTE — Telephone Encounter (Signed)
Patient called and states she is taking Isosorbide 60 mg 1 tab and then tried 1/2 tab BID and BP still elevated in mornings.  BP was 180/106 this AM.  Per Dr Melford Aase, increase the Isosorbide 6 mg to 1 tab BID and ordered patient to have a renal artery U/S.  Patient aware and will be contacted about appointment.

## 2015-03-06 ENCOUNTER — Other Ambulatory Visit (HOSPITAL_BASED_OUTPATIENT_CLINIC_OR_DEPARTMENT_OTHER): Payer: Medicare Other

## 2015-03-06 ENCOUNTER — Ambulatory Visit (HOSPITAL_BASED_OUTPATIENT_CLINIC_OR_DEPARTMENT_OTHER): Payer: Medicare Other

## 2015-03-06 ENCOUNTER — Ambulatory Visit (HOSPITAL_COMMUNITY)
Admission: RE | Admit: 2015-03-06 | Discharge: 2015-03-06 | Disposition: A | Discharge status: Home / Self Care | Payer: Medicare Other | Source: Ambulatory Visit | Attending: Internal Medicine | Admitting: Internal Medicine

## 2015-03-06 ENCOUNTER — Ambulatory Visit (HOSPITAL_COMMUNITY): Payer: Medicare Other

## 2015-03-06 VITALS — BP 154/84 | HR 57 | Temp 97.7°F

## 2015-03-06 DIAGNOSIS — I10 Essential (primary) hypertension: Secondary | ICD-10-CM | POA: Diagnosis not present

## 2015-03-06 DIAGNOSIS — N281 Cyst of kidney, acquired: Secondary | ICD-10-CM | POA: Insufficient documentation

## 2015-03-06 DIAGNOSIS — N1330 Unspecified hydronephrosis: Secondary | ICD-10-CM | POA: Insufficient documentation

## 2015-03-06 DIAGNOSIS — D649 Anemia, unspecified: Secondary | ICD-10-CM

## 2015-03-06 DIAGNOSIS — D631 Anemia in chronic kidney disease: Secondary | ICD-10-CM | POA: Diagnosis not present

## 2015-03-06 DIAGNOSIS — N183 Chronic kidney disease, stage 3 unspecified: Secondary | ICD-10-CM

## 2015-03-06 LAB — CBC WITH DIFFERENTIAL/PLATELET
BASO%: 0.7 % (ref 0.0–2.0)
Basophils Absolute: 0 10*3/uL (ref 0.0–0.1)
EOS ABS: 0.1 10*3/uL (ref 0.0–0.5)
EOS%: 1.8 % (ref 0.0–7.0)
HCT: 31.1 % — ABNORMAL LOW (ref 34.8–46.6)
HEMOGLOBIN: 10.4 g/dL — AB (ref 11.6–15.9)
LYMPH%: 35.9 % (ref 14.0–49.7)
MCH: 28.8 pg (ref 25.1–34.0)
MCHC: 33.2 g/dL (ref 31.5–36.0)
MCV: 86.6 fL (ref 79.5–101.0)
MONO#: 0.4 10*3/uL (ref 0.1–0.9)
MONO%: 8.2 % (ref 0.0–14.0)
NEUT%: 53.4 % (ref 38.4–76.8)
NEUTROS ABS: 2.6 10*3/uL (ref 1.5–6.5)
PLATELETS: 155 10*3/uL (ref 145–400)
RBC: 3.6 10*6/uL — ABNORMAL LOW (ref 3.70–5.45)
RDW: 16.2 % — AB (ref 11.2–14.5)
WBC: 4.9 10*3/uL (ref 3.9–10.3)
lymph#: 1.7 10*3/uL (ref 0.9–3.3)

## 2015-03-06 MED ORDER — DARBEPOETIN ALFA 300 MCG/0.6ML IJ SOSY
300.0000 ug | PREFILLED_SYRINGE | INTRAMUSCULAR | Status: DC
  Administered 2015-03-06: 300 ug via SUBCUTANEOUS
  Filled 2015-03-06: qty 0.6

## 2015-03-06 NOTE — Progress Notes (Signed)
Preliminary results by tech - No evidence of renal arteries stenosis noted bilaterally. There was mild hydronephrosis in the right kidney with simple appearing cysts noted in both kidneys. Oda Cogan, BS, RDMS, RVT

## 2015-03-09 ENCOUNTER — Other Ambulatory Visit: Payer: Self-pay | Admitting: Internal Medicine

## 2015-03-09 ENCOUNTER — Telehealth: Payer: Self-pay | Admitting: *Deleted

## 2015-03-09 DIAGNOSIS — I150 Renovascular hypertension: Secondary | ICD-10-CM

## 2015-03-09 MED ORDER — AMLODIPINE BESYLATE 10 MG PO TABS: ORAL_TABLET | ORAL | Status: DC

## 2015-03-09 NOTE — Telephone Encounter (Signed)
Patient called and reported her BP is 187/104.  Dr Melford Aase recommended we send in an RX for Amlodipine 10 mg but patient states she has a constant headache and pain in her temples at times.  She states she wants to come in to see Dr Melford Aase instead of starting another RX and waiting 10 days for a follow up.  Patient has an appointment tomorrow AM.

## 2015-03-10 ENCOUNTER — Ambulatory Visit (INDEPENDENT_AMBULATORY_CARE_PROVIDER_SITE_OTHER): Payer: Medicare Other | Admitting: Internal Medicine

## 2015-03-10 ENCOUNTER — Encounter: Payer: Self-pay | Admitting: Internal Medicine

## 2015-03-10 VITALS — BP 162/86 | HR 64 | Temp 97.0°F | Resp 16 | Ht 63.0 in | Wt 173.8 lb

## 2015-03-10 DIAGNOSIS — R7309 Other abnormal glucose: Secondary | ICD-10-CM | POA: Diagnosis not present

## 2015-03-10 DIAGNOSIS — R7303 Prediabetes: Secondary | ICD-10-CM

## 2015-03-10 DIAGNOSIS — I10 Essential (primary) hypertension: Secondary | ICD-10-CM

## 2015-03-10 NOTE — Patient Instructions (Addendum)
Continue Labetalol 300 mg  2 x day  -  Breakfast   &   Supper  Restart Losartan Hctz 100/25  -  1 tablet every morning  Get Amlodipine (Norvasc) 10 mg & start at 1/2 tablet at supper   If BP stays over 170   then may increase Amlodipine to 1 whole tablet = 10 gd  +++++++++++++++++++++++++++++++++++++++  For now stop Isosorbide - but if blood pressure elevated over 180,   then may take 1/2 tablet up to 2 x daily (am  & pm)  +++++++++++++++++++++++++  Please call my cell phone [phone]    after hours/weekends if have questions

## 2015-03-12 ENCOUNTER — Other Ambulatory Visit: Payer: Self-pay | Admitting: Physician Assistant

## 2015-03-15 ENCOUNTER — Encounter: Payer: Self-pay | Admitting: Internal Medicine

## 2015-03-15 NOTE — Progress Notes (Deleted)
Patient ID: Susan [name], female   DOB: [birth year], 74 y.o.   MRN: [id number]

## 2015-03-15 NOTE — Progress Notes (Signed)
Subjective:    Patient ID: Susan [name], female    DOB: October 12, 1940, 74 y.o.   MRN: [id number]   HPI  Patient returns today for BP recheck with wide swings in BPs. Has had several adjustments to her BP regimen and recently had Imdur added to her regimen at last OV in [place] and after a call; she was advised to increase Imdur to bid and then after a subsequent call with widely  fluctuating BP's, a Rx for Amlodipine was sent in, but she relates today that she never picked up the Amlodipine RX and in fact, she also stopped her gHyzaar a few weeks ago when she started the Imdur.     Today she reports earlier today, BP was 180/111 with an associated HA and now in the office her BP is 162/82.Denies dizziness, CP, palpitation dyspnea or edema. Denies any diabetic polys.   Medication Sig  . amLODipine (NORVASC) 10 MG tablet Take 1/2 to 1 tablet daily for BP - NEVER FILLED  . aspirin 81 MG chewable tablet Chew 81 mg by mouth every morning.   [name] VITAMIN D 5000 UNITS TABS Take 10,000 Units by mouth 2 (two) times daily.   . Darbepoetin Alfa-Alb (ARANESP I) Inject as directed every 21 ( twenty-one) days.   . diclofenac  75 MG EC tablet Take 1 tablet (75 mg total) by mouth 2 (two) times daily.  [name] ipratropium  0.03 % nasal spray Take 1 to 2 sprays into each nostril up to 2 or 3 x day as needed  . Isosorbide (IMDUR) 60 MG 24 hr tablet Take 1/2 to 1 tablet daily for BP  . labetalol ( 300 MG tablet Take 1 tablet (300 mg total) by mouth 2 (two) times daily.  [name] levothyroxine  100 MCG tablet Take 1 tablet daily on an empty stomach for 30 minutes  . pantoprazole  40 MG tablet TAKE ONE TABLET BY MOUTH ONCE DAILY FOR  ACID  REFLUX  . pravastatin  20 MG tablet Take 20 mg by mouth daily.  [name] losartan-hctz (HYZAAR) 100-25 MG  Take 1 tablet daily for BP -- OFF    Allergies  Allergen Reactions  . Codeine Anaphylaxis  . Penicillins Anaphylaxis  . Bactrim Itching and Swelling  . Clarithromycin Other (See Comments)   "Caused skin to peel off my hand."  . Flagyl [Metronidazole Hcl] Itching and Swelling  . Sulfa Antibiotics Itching and Swelling   Past Medical History  Diagnosis Date  . Anemia   . Arthritis   . Myelodysplasia   . Hypertension   . HLD (hyperlipidemia)   . Hypothyroidism   . GERD (gastroesophageal reflux disease)   . Vitamin D deficiency    Review of Systems  10 point systems review negative except as above.    Objective:   Physical Exam  BP 162/86   Pulse 64  Temp 97 F  Resp 16  Ht 5\' 3"    Wt 173 lb 12.8 oz     BMI 30.79   HEENT - Eac's patent. TM's Nl. EOM's full. PERRLA. NasoOroPharynx clear. Neck - supple. Nl Thyroid. Carotids 2+ & No bruits, nodes, JVD Chest - Clear equal BS w/o Rales, rhonchi, wheezes. Cor - Nl HS. RRR w/o sig MGR. PP 1(+). No edema. Abd - No palpable organomegaly, masses or tenderness. BS nl. MS- FROM w/o deformities. Muscle power, tone and bulk Nl. Gait Nl. Neuro - No obvious Cr N abnormalities. Sensory, motor and Cerebellar functions appear Nl w/o  focal abnormalities. Psyche - Mental status normal & appropriate.  No delusions, ideations or obvious mood abnormalities.    Assessment & Plan:   1. Essential hypertension  - Continue Labetalol 300 mg bid - Restart gHyzaar 100/25 qd - get & start Amlodipine 10 mg x 1/2 up to 1 whole if needed  - Use Imdur prn BP elevated - ROV 3 weeks or call prn - given my Cell #  2. Prediabetes  - continue prudent diet

## 2015-03-16 ENCOUNTER — Telehealth: Payer: Self-pay | Admitting: Oncology

## 2015-03-16 NOTE — Telephone Encounter (Signed)
AJ PAL - moved 6/24 appointment from [place] to [facility] per [place]. Spoke with patient re new time for lab/LT/inj 6/24 @ 8:45am.

## 2015-03-20 ENCOUNTER — Other Ambulatory Visit: Payer: BLUE CROSS/BLUE SHIELD

## 2015-03-20 ENCOUNTER — Ambulatory Visit: Payer: BLUE CROSS/BLUE SHIELD | Admitting: Physician Assistant

## 2015-03-20 ENCOUNTER — Ambulatory Visit: Payer: BLUE CROSS/BLUE SHIELD

## 2015-03-20 ENCOUNTER — Other Ambulatory Visit: Payer: Self-pay | Admitting: Internal Medicine

## 2015-03-27 ENCOUNTER — Ambulatory Visit (HOSPITAL_BASED_OUTPATIENT_CLINIC_OR_DEPARTMENT_OTHER): Payer: Medicare Other | Admitting: Nurse Practitioner

## 2015-03-27 ENCOUNTER — Ambulatory Visit: Payer: Medicare Other

## 2015-03-27 ENCOUNTER — Other Ambulatory Visit (HOSPITAL_BASED_OUTPATIENT_CLINIC_OR_DEPARTMENT_OTHER): Payer: Medicare Other

## 2015-03-27 VITALS — BP 120/59 | HR 62 | Temp 98.5°F | Resp 18 | Ht 63.0 in | Wt 169.7 lb

## 2015-03-27 DIAGNOSIS — N189 Chronic kidney disease, unspecified: Secondary | ICD-10-CM

## 2015-03-27 DIAGNOSIS — D649 Anemia, unspecified: Secondary | ICD-10-CM

## 2015-03-27 DIAGNOSIS — D631 Anemia in chronic kidney disease: Secondary | ICD-10-CM | POA: Diagnosis not present

## 2015-03-27 DIAGNOSIS — N183 Chronic kidney disease, stage 3 unspecified: Secondary | ICD-10-CM

## 2015-03-27 LAB — CBC WITH DIFFERENTIAL/PLATELET
BASO%: 0.3 % (ref 0.0–2.0)
BASOS ABS: 0 10*3/uL (ref 0.0–0.1)
EOS%: 2.2 % (ref 0.0–7.0)
Eosinophils Absolute: 0.1 10*3/uL (ref 0.0–0.5)
HCT: 36.7 % (ref 34.8–46.6)
HGB: 12.4 g/dL (ref 11.6–15.9)
LYMPH#: 1.8 10*3/uL (ref 0.9–3.3)
LYMPH%: 49.4 % (ref 14.0–49.7)
MCH: 28.4 pg (ref 25.1–34.0)
MCHC: 33.8 g/dL (ref 31.5–36.0)
MCV: 84 fL (ref 79.5–101.0)
MONO#: 0.3 10*3/uL (ref 0.1–0.9)
MONO%: 7.5 % (ref 0.0–14.0)
NEUT#: 1.5 10*3/uL (ref 1.5–6.5)
NEUT%: 40.6 % (ref 38.4–76.8)
NRBC: 0 % (ref 0–0)
Platelets: 134 10*3/uL — ABNORMAL LOW (ref 145–400)
RBC: 4.37 10*6/uL (ref 3.70–5.45)
RDW: 15.5 % — AB (ref 11.2–14.5)
WBC: 3.6 10*3/uL — ABNORMAL LOW (ref 3.9–10.3)

## 2015-03-27 MED ORDER — DARBEPOETIN ALFA 300 MCG/0.6ML IJ SOSY: 300.0000 ug | PREFILLED_SYRINGE | INTRAMUSCULAR | Status: DC

## 2015-03-27 NOTE — Progress Notes (Signed)
  [facility] OFFICE PROGRESS NOTE   Principle Diagnosis: This is a 74 year old woman with multifactorial anemia. 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  Secondary Diagnosis: Controlled hypertension.  Current therapy: She is on Aranesp 300 mcg every 3 weeks to keep her hemoglobin above 11.    INTERVAL HISTORY:   Ms. Dangel returns as scheduled. She continues every three-week Aranesp. She feels well. She reports her blood pressure medications have been adjusted with improved control. She denies shortness of breath. No bleeding. She has intermittent constipation which is relieved with dietary maneuvers. No urine problems. Appetite is "okay". She continues to have a good energy level. She remains very active.  Objective:  Vital signs in last 24 hours:  Blood pressure 120/59, pulse 62, temperature 98.5 F (36.9 C), temperature source Oral, resp. rate 18, height 5\' 3"  (1.6 m), weight 169 lb 11.2 oz (76.975 kg), SpO2 100 %.    HEENT: No thrush or ulcers. Lymphatics: No palpable cervical or supra clavicular lymph nodes. Resp: Lungs clear bilaterally. Cardio: Regular rate and rhythm. GI: Abdomen soft and nontender. No hepatomegaly. No splenomegaly. Vascular: No leg edema. Calves soft and nontender.   Lab Results:  Lab Results  Component Value Date   WBC 3.6* 03/27/2015   HGB 12.4 03/27/2015   HCT 36.7 03/27/2015   MCV 84.0 03/27/2015   PLT 134* 03/27/2015   NEUTROABS 1.5 03/27/2015    Imaging:  No results found.  Medications: I have reviewed the patient's current medications.  Assessment/Plan: 1. Multifactorial anemia: She has an element of anemia of renal disease as well as anemia of possible myelodysplasia. Hemoglobin is 12.4 today. Aranesp will be held.The plan is to keep her on Aranesp 300 mcg every 3 weeks to keep her hemoglobin above 11.  2. Hypertension: On multiple blood pressure medications. Good blood pressure  reading today 3. History of questionable myelodysplasia:  Counts remain stable. 4. Follow up. Every 3 weeks for CBC and injection. Visit in 3-4 months.    Disposition: Ms. Perry will not receive Aranesp today due to hemoglobin of 12.4. She will return in 3 weeks for labs and possible Aranesp injection. The plan is to continue Aranesp 300 mcg every 3 weeks to maintain a hemoglobin greater than 11. She will return for a follow-up visit in 3-4 months.    Ned Card ANP/GNP-BC   03/27/2015  9:38 AM

## 2015-03-31 ENCOUNTER — Ambulatory Visit (INDEPENDENT_AMBULATORY_CARE_PROVIDER_SITE_OTHER): Payer: Medicare Other | Admitting: Internal Medicine

## 2015-03-31 ENCOUNTER — Encounter: Payer: Self-pay | Admitting: Internal Medicine

## 2015-03-31 VITALS — BP 142/82 | HR 60 | Temp 97.5°F | Resp 16 | Ht 63.0 in | Wt 170.2 lb

## 2015-03-31 DIAGNOSIS — I150 Renovascular hypertension: Secondary | ICD-10-CM

## 2015-03-31 DIAGNOSIS — R7303 Prediabetes: Secondary | ICD-10-CM

## 2015-03-31 DIAGNOSIS — R7309 Other abnormal glucose: Secondary | ICD-10-CM | POA: Diagnosis not present

## 2015-03-31 NOTE — Progress Notes (Signed)
Subjective:    Patient ID: Susan [name], female    DOB: 05-26-1941, 74 y.o.   MRN: [id number]  HPI Very nuice lady with ;labile HTN , CKD 3and MDS predating since 1984 who started blood Tx's in 2008 later swu=itchied to Epo tx's who returns today for BP f/u. She has has several med adjustments recently to control very labile BP's which in part occurred as she misunderstood and stopped her Hyzaar when started on Norvasc and then had Imdur added . Later it was discovered and her Hyzaar was restarted and Imdur was then changed to prn only and BP's over the last 3-4 weeks have been much improved in the Nl to high NORMAL range and patient feels much better and reassured. CV systems review is negative.   Medication Sig  . amLODipine 10 MG tablet Take 1/2 to 1 tablet daily for BP  . aspirin 81 MG chewable tablet Chew 81 mg by mouth every morning.   [name] VITAMIN D- 5000 UNITS  Take 10,000 Units by mouth 2 (two) times daily.   . Darbepoetin / ARANESP Inject as directed every 21 ( twenty-one) days.   . diclofenac (VOLTAREN) 75 MG EC tablet Take 1 tablet (75 mg total) by mouth 2 (two) times daily.  [name] ipratropium (ATROVENT) 0.03 % nasal spray Take 1 to 2 sprays into each nostril up to 2 or 3 x day as needed  . isosorbide mono (IMDUR) 60 MG  tablet Take 1/2 to 1 tablet daily for BP  . labetalol (NORMODYNE) 300 MG tablet Take 1 tablet (300 mg total) by mouth 2 (two) times daily.  [name] levothyroxine (SYNTHROID) 100 MCG tablet Take 1 tablet daily on an empty stomach for 30 minutes  . losartan-hctz (HYZAAR) 100-25 MG per tablet Take 1 tablet daily for BP  . pantoprazole (PROTONIX) 40 MG tablet TAKE ONE TABLET BY MOUTH ONCE DAILY FOR  ACID  REFLUX  . pravastatin (PRAVACHOL) 20 MG tablet Take 20 mg by mouth daily.   Allergies  Allergen Reactions  . Codeine Anaphylaxis  . Penicillins Anaphylaxis  . Bactrim Itching and Swelling  . Clarithromycin Other (See Comments)    "Caused skin to peel off my hand."  .  Flagyl [Metronidazole Hcl] Itching and Swelling  . Sulfa Antibiotics Itching and Swelling   Past Medical History  Diagnosis Date  . Anemia   . Arthritis   . Myelodysplasia   . Hypertension   . HLD (hyperlipidemia)   . Hypothyroidism   . GERD (gastroesophageal reflux disease)   . Vitamin D deficiency    Review of Systems 10 point systems review negative except as above.    Objective:   Physical Exam  BP 142/82 mmHg  Pulse 60  Temp(Src) 97.5 F (36.4 C)  Resp 16  Ht 5\' 3"  (1.6 m)  Wt 170 lb 3.2 oz (77.202 kg)  BMI 30.16 kg/m2  HEENT - Eac's patent. TM's Nl. EOM's full. PERRLA. NasoOroPharynx clear. Neck - supple. Nl Thyroid. Carotids 2+ & No bruits, nodes, JVD Chest - Clear equal BS w/o Rales, rhonchi, wheezes. Cor - Nl HS. RRR w/o sig MGR. PP 1(+). No edema. Abd - No palpable organomegaly, masses or tenderness. BS nl. MS- FROM w/o deformities. Muscle power, tone and bulk Nl. Gait Nl. Neuro - No obvious Cr N abnormalities. Sensory, motor and Cerebellar functions appear Nl w/o focal abnormalities. Psyche - Mental status normal & appropriate.  No delusions, ideations or obvious mood abnormalities.    Assessment &  Plan:   1. Renovascular hypertension   2. Prediabetes   - Discussed meds/SE's and to continue same regimen.

## 2015-03-31 NOTE — Patient Instructions (Signed)

## 2015-04-17 ENCOUNTER — Ambulatory Visit (HOSPITAL_BASED_OUTPATIENT_CLINIC_OR_DEPARTMENT_OTHER): Payer: Medicare Other

## 2015-04-17 ENCOUNTER — Other Ambulatory Visit (HOSPITAL_BASED_OUTPATIENT_CLINIC_OR_DEPARTMENT_OTHER): Payer: Medicare Other

## 2015-04-17 VITALS — BP 116/63 | HR 63 | Temp 98.6°F

## 2015-04-17 DIAGNOSIS — N183 Chronic kidney disease, stage 3 unspecified: Secondary | ICD-10-CM

## 2015-04-17 DIAGNOSIS — D631 Anemia in chronic kidney disease: Secondary | ICD-10-CM

## 2015-04-17 DIAGNOSIS — D649 Anemia, unspecified: Secondary | ICD-10-CM

## 2015-04-17 DIAGNOSIS — N189 Chronic kidney disease, unspecified: Secondary | ICD-10-CM

## 2015-04-17 LAB — CBC WITH DIFFERENTIAL/PLATELET
BASO%: 0.7 % (ref 0.0–2.0)
Basophils Absolute: 0 10*3/uL (ref 0.0–0.1)
EOS ABS: 0.1 10*3/uL (ref 0.0–0.5)
EOS%: 1.5 % (ref 0.0–7.0)
HCT: 32.3 % — ABNORMAL LOW (ref 34.8–46.6)
HGB: 10.7 g/dL — ABNORMAL LOW (ref 11.6–15.9)
LYMPH%: 46.6 % (ref 14.0–49.7)
MCH: 27.5 pg (ref 25.1–34.0)
MCHC: 33.2 g/dL (ref 31.5–36.0)
MCV: 82.9 fL (ref 79.5–101.0)
MONO#: 0.3 10*3/uL (ref 0.1–0.9)
MONO%: 7.1 % (ref 0.0–14.0)
NEUT#: 1.9 10*3/uL (ref 1.5–6.5)
NEUT%: 44.1 % (ref 38.4–76.8)
PLATELETS: 146 10*3/uL (ref 145–400)
RBC: 3.9 10*6/uL (ref 3.70–5.45)
RDW: 15.7 % — AB (ref 11.2–14.5)
WBC: 4.2 10*3/uL (ref 3.9–10.3)
lymph#: 2 10*3/uL (ref 0.9–3.3)

## 2015-04-17 MED ORDER — DARBEPOETIN ALFA 300 MCG/0.6ML IJ SOSY
300.0000 ug | PREFILLED_SYRINGE | INTRAMUSCULAR | Status: DC
  Administered 2015-04-17: 300 ug via SUBCUTANEOUS
  Filled 2015-04-17: qty 0.6

## 2015-04-25 ENCOUNTER — Other Ambulatory Visit: Payer: Self-pay | Admitting: Internal Medicine

## 2015-04-27 ENCOUNTER — Other Ambulatory Visit: Payer: Self-pay | Admitting: Internal Medicine

## 2015-05-07 ENCOUNTER — Other Ambulatory Visit: Payer: Self-pay | Admitting: Physician Assistant

## 2015-05-08 ENCOUNTER — Other Ambulatory Visit (HOSPITAL_BASED_OUTPATIENT_CLINIC_OR_DEPARTMENT_OTHER): Payer: Medicare Other

## 2015-05-08 ENCOUNTER — Ambulatory Visit (HOSPITAL_BASED_OUTPATIENT_CLINIC_OR_DEPARTMENT_OTHER): Payer: Medicare Other

## 2015-05-08 VITALS — BP 135/69 | HR 60 | Temp 98.5°F

## 2015-05-08 DIAGNOSIS — D649 Anemia, unspecified: Secondary | ICD-10-CM

## 2015-05-08 DIAGNOSIS — D631 Anemia in chronic kidney disease: Secondary | ICD-10-CM

## 2015-05-08 DIAGNOSIS — N183 Chronic kidney disease, stage 3 unspecified: Secondary | ICD-10-CM

## 2015-05-08 DIAGNOSIS — D469 Myelodysplastic syndrome, unspecified: Secondary | ICD-10-CM

## 2015-05-08 LAB — CBC WITH DIFFERENTIAL/PLATELET
BASO%: 0.7 % (ref 0.0–2.0)
BASOS ABS: 0 10*3/uL (ref 0.0–0.1)
EOS%: 2.2 % (ref 0.0–7.0)
Eosinophils Absolute: 0.1 10*3/uL (ref 0.0–0.5)
HCT: 32.1 % — ABNORMAL LOW (ref 34.8–46.6)
HGB: 10.8 g/dL — ABNORMAL LOW (ref 11.6–15.9)
LYMPH%: 51.2 % — ABNORMAL HIGH (ref 14.0–49.7)
MCH: 28.6 pg (ref 25.1–34.0)
MCHC: 33.6 g/dL (ref 31.5–36.0)
MCV: 85.1 fL (ref 79.5–101.0)
MONO#: 0.2 10*3/uL (ref 0.1–0.9)
MONO%: 8 % (ref 0.0–14.0)
NEUT#: 1.2 10*3/uL — ABNORMAL LOW (ref 1.5–6.5)
NEUT%: 37.9 % — ABNORMAL LOW (ref 38.4–76.8)
Platelets: 117 10*3/uL — ABNORMAL LOW (ref 145–400)
RBC: 3.78 10*6/uL (ref 3.70–5.45)
RDW: 17.8 % — AB (ref 11.2–14.5)
WBC: 3.1 10*3/uL — ABNORMAL LOW (ref 3.9–10.3)
lymph#: 1.6 10*3/uL (ref 0.9–3.3)

## 2015-05-08 MED ORDER — DARBEPOETIN ALFA 300 MCG/0.6ML IJ SOSY
300.0000 ug | PREFILLED_SYRINGE | INTRAMUSCULAR | Status: DC
  Administered 2015-05-08: 300 ug via SUBCUTANEOUS
  Filled 2015-05-08: qty 0.6

## 2015-05-29 ENCOUNTER — Other Ambulatory Visit (HOSPITAL_BASED_OUTPATIENT_CLINIC_OR_DEPARTMENT_OTHER): Payer: Medicare Other

## 2015-05-29 ENCOUNTER — Ambulatory Visit: Payer: Medicare Other

## 2015-05-29 DIAGNOSIS — D469 Myelodysplastic syndrome, unspecified: Secondary | ICD-10-CM

## 2015-05-29 DIAGNOSIS — D649 Anemia, unspecified: Secondary | ICD-10-CM

## 2015-05-29 LAB — CBC WITH DIFFERENTIAL/PLATELET
BASO%: 0.4 % (ref 0.0–2.0)
BASOS ABS: 0 10*3/uL (ref 0.0–0.1)
EOS ABS: 0.1 10*3/uL (ref 0.0–0.5)
EOS%: 2.4 % (ref 0.0–7.0)
HCT: 35.1 % (ref 34.8–46.6)
HEMOGLOBIN: 11.8 g/dL (ref 11.6–15.9)
LYMPH%: 46.8 % (ref 14.0–49.7)
MCH: 28.6 pg (ref 25.1–34.0)
MCHC: 33.5 g/dL (ref 31.5–36.0)
MCV: 85.4 fL (ref 79.5–101.0)
MONO#: 0.3 10*3/uL (ref 0.1–0.9)
MONO%: 8.2 % (ref 0.0–14.0)
NEUT#: 1.6 10*3/uL (ref 1.5–6.5)
NEUT%: 42.2 % (ref 38.4–76.8)
Platelets: 148 10*3/uL (ref 145–400)
RBC: 4.11 10*6/uL (ref 3.70–5.45)
RDW: 17.1 % — ABNORMAL HIGH (ref 11.2–14.5)
WBC: 3.7 10*3/uL — ABNORMAL LOW (ref 3.9–10.3)
lymph#: 1.7 10*3/uL (ref 0.9–3.3)

## 2015-05-29 MED ORDER — DARBEPOETIN ALFA 300 MCG/0.6ML IJ SOSY: 300.0000 ug | PREFILLED_SYRINGE | INTRAMUSCULAR | Status: DC

## 2015-06-01 ENCOUNTER — Other Ambulatory Visit: Payer: Self-pay | Admitting: *Deleted

## 2015-06-01 DIAGNOSIS — I10 Essential (primary) hypertension: Secondary | ICD-10-CM

## 2015-06-01 MED ORDER — LABETALOL HCL 300 MG PO TABS
300.0000 mg | ORAL_TABLET | Freq: Two times a day (BID) | ORAL | Status: DC
Start: 2015-06-01 — End: 2016-06-21

## 2015-06-02 ENCOUNTER — Ambulatory Visit: Payer: Self-pay | Admitting: Internal Medicine

## 2015-06-02 ENCOUNTER — Ambulatory Visit (INDEPENDENT_AMBULATORY_CARE_PROVIDER_SITE_OTHER): Payer: Medicare Other | Admitting: Physician Assistant

## 2015-06-02 ENCOUNTER — Encounter: Payer: Self-pay | Admitting: Physician Assistant

## 2015-06-02 VITALS — BP 122/70 | HR 64 | Temp 97.3°F | Resp 16 | Ht 63.0 in | Wt 169.6 lb

## 2015-06-02 DIAGNOSIS — D469 Myelodysplastic syndrome, unspecified: Secondary | ICD-10-CM

## 2015-06-02 DIAGNOSIS — R7309 Other abnormal glucose: Secondary | ICD-10-CM | POA: Diagnosis not present

## 2015-06-02 DIAGNOSIS — Z0000 Encounter for general adult medical examination without abnormal findings: Secondary | ICD-10-CM | POA: Insufficient documentation

## 2015-06-02 DIAGNOSIS — M6530 Trigger finger, unspecified finger: Secondary | ICD-10-CM

## 2015-06-02 DIAGNOSIS — E039 Hypothyroidism, unspecified: Secondary | ICD-10-CM | POA: Diagnosis not present

## 2015-06-02 DIAGNOSIS — Z79899 Other long term (current) drug therapy: Secondary | ICD-10-CM

## 2015-06-02 DIAGNOSIS — M21611 Bunion of right foot: Secondary | ICD-10-CM

## 2015-06-02 DIAGNOSIS — N183 Chronic kidney disease, stage 3 unspecified: Secondary | ICD-10-CM

## 2015-06-02 DIAGNOSIS — E782 Mixed hyperlipidemia: Secondary | ICD-10-CM

## 2015-06-02 DIAGNOSIS — I150 Renovascular hypertension: Secondary | ICD-10-CM

## 2015-06-02 DIAGNOSIS — E559 Vitamin D deficiency, unspecified: Secondary | ICD-10-CM

## 2015-06-02 DIAGNOSIS — R7303 Prediabetes: Secondary | ICD-10-CM

## 2015-06-02 DIAGNOSIS — M21612 Bunion of left foot: Secondary | ICD-10-CM

## 2015-06-02 LAB — BASIC METABOLIC PANEL WITH GFR
BUN: 29 mg/dL — ABNORMAL HIGH (ref 7–25)
CALCIUM: 9.3 mg/dL (ref 8.6–10.4)
CO2: 25 mmol/L (ref 20–31)
Chloride: 108 mmol/L (ref 98–110)
Creat: 1.48 mg/dL — ABNORMAL HIGH (ref 0.60–0.93)
GFR, EST AFRICAN AMERICAN: 40 mL/min — AB (ref >=60)
GFR, EST NON AFRICAN AMERICAN: 34 mL/min — AB (ref >=60)
GLUCOSE: 93 mg/dL (ref 65–99)
POTASSIUM: 3.5 mmol/L (ref 3.5–5.3)
SODIUM: 141 mmol/L (ref 135–146)

## 2015-06-02 LAB — HEPATIC FUNCTION PANEL
ALT: 10 U/L (ref 6–29)
AST: 18 U/L (ref 10–35)
Albumin: 4.1 g/dL (ref 3.6–5.1)
Alkaline Phosphatase: 85 U/L (ref 33–130)
BILIRUBIN DIRECT: 0.2 mg/dL (ref <=0.2)
BILIRUBIN INDIRECT: 0.5 mg/dL (ref 0.2–1.2)
BILIRUBIN TOTAL: 0.7 mg/dL (ref 0.2–1.2)
Total Protein: 7.1 g/dL (ref 6.1–8.1)

## 2015-06-02 LAB — LIPID PANEL
CHOL/HDL RATIO: 2.8 ratio (ref <=5.0)
CHOLESTEROL: 155 mg/dL (ref 125–200)
HDL: 56 mg/dL (ref >=46)
LDL CALC: 83 mg/dL (ref <130)
TRIGLYCERIDES: 82 mg/dL (ref <150)
VLDL: 16 mg/dL (ref <30)

## 2015-06-02 LAB — MAGNESIUM: Magnesium: 1.6 mg/dL (ref 1.5–2.5)

## 2015-06-02 NOTE — Patient Instructions (Signed)
Trigger Finger Trigger finger (digital tendinitis and stenosing tenosynovitis) is a common disorder that causes an often painful catching of the fingers or thumb. It occurs as a clicking, snapping, or locking of a finger in the palm of the hand. This is caused by a problem with the tendons that flex or bend the fingers sliding smoothly through their sheaths. The condition may occur in any finger or a couple fingers at the same time.  The finger may lock with the finger curled or suddenly straighten out with a snap. This is more common in patients with rheumatoid arthritis and diabetes. Left untreated, the condition may get worse to the point where the finger becomes locked in flexion, like making a fist, or less commonly locked with the finger straightened out. CAUSES   Inflammation and scarring that lead to swelling around the tendon sheath.  Repeated or forceful movements.  Rheumatoid arthritis, an autoimmune disease that affects joints.  Gout.  Diabetes mellitus. SIGNS AND SYMPTOMS  Soreness and swelling of your finger.  A painful clicking or snapping as you bend and straighten your finger. DIAGNOSIS  Your health care provider will do a physical exam of your finger to diagnose trigger finger. TREATMENT   Splinting for 6-8 weeks may be helpful.  Nonsteroidal anti-inflammatory medicines (NSAIDs) can help to relieve the pain and inflammation.  Cortisone injections, along with splinting, may speed up recovery. Several injections may be required. Cortisone may give relief after one injection.  Surgery is another treatment that may be used if conservative treatments do not work. Surgery can be minor, without incisions (a cut does not have to be made), and can be done with a needle through the skin.  Other surgical choices involve an open procedure in which the surgeon opens the hand through a small incision and cuts the pulley so the tendon can again slide smoothly. Your hand will still  work fine. HOME CARE INSTRUCTIONS  Apply ice to the injured area, twice per day:  Put ice in a plastic bag.  Place a towel between your skin and the bag.  Leave the ice on for 20 minutes, 3-4 times a day.  Rest your hand often. MAKE SURE YOU:   Understand these instructions.  Will watch your condition.  Will get help right away if you are not doing well or get worse. Document Released: 07/09/2004 Document Revised: 05/22/2013 Document Reviewed: 02/19/2013 [employer] Patient Information 2015 [employer]. This information is not intended to replace advice given to you by your health care provider. Make sure you discuss any questions you have with your health care provider.  

## 2015-06-02 NOTE — Progress Notes (Signed)
Assessment and Plan:  1. Hypertension -Continue medication, monitor blood pressure at home. Continue DASH diet.  Reminder to go to the ER if any CP, SOB, nausea, dizziness, severe HA, changes vision/speech, left arm numbness and tingling and jaw pain.  2. Cholesterol -Continue diet and exercise. Check cholesterol.   3. Prediabetes  -Continue diet and exercise. Check A1C  4. Vitamin D Def - check level and continue medications.  5. Left trigger finger/OA Will refer to ortho for injection  6. Bunions Will send to podiatry   Continue diet and meds as discussed. Further disposition pending results of labs. Over 30 minutes of exam, counseling, chart review, and critical decision making was performed  HPI 74 y.o. AA female  presents for 3 month follow up on hypertension, cholesterol, prediabetes, and vitamin D deficiency.   Her blood pressure has been controlled at home with addition of new medications, she is on labetalol 300 BID, hyzaar 100/25 went down to 1/2, norvasc 10 on 1/2, and she is not on imdur, today their BP is BP: 122/70 mmHg  She does workout. She denies chest pain, shortness of breath, dizziness.  She is on cholesterol medication, she is on pravastatin 40mg  and on 1/2 daily and denies myalgias. Her cholesterol is at goal. The cholesterol last visit was:   Lab Results  Component Value Date   CHOL 161 02/24/2015   HDL 55 02/24/2015   LDLCALC 78 02/24/2015   TRIG 142 02/24/2015   CHOLHDL 2.9 02/24/2015    She has been working on diet and exercise for prediabetes, and denies paresthesia of the feet, polydipsia, polyuria and visual disturbances. Last A1C in the office was:  Lab Results  Component Value Date   HGBA1C 4.7 02/24/2015   Patient is on Vitamin D supplement.   Lab Results  Component Value Date   VD25OH 72 02/24/2015    She is on thyroid medication. Her medication was not changed last visit.   Lab Results  Component Value Date   TSH 2.041 02/24/2015  .   Right handed female, with left hand pain, has pain at all MCP joints, worse 5th digit, worse with rain/cold, and will lock up on her and she will have to pull it back to extended it.   Current Medications:  Current Outpatient Prescriptions on File Prior to Visit  Medication Sig Dispense Refill  . amLODipine (NORVASC) 10 MG tablet Take 1/2 to 1 tablet daily for BP 30 tablet 5  . aspirin 81 MG chewable tablet Chew 81 mg by mouth every morning.     . Cholecalciferol (VITAMIN D-3) 5000 UNITS TABS Take 10,000 Units by mouth 2 (two) times daily.     . Darbepoetin Alfa-Albumin (ARANESP IJ) Inject as directed every 21 ( twenty-one) days.     . diclofenac (VOLTAREN) 75 MG EC tablet TAKE ONE TABLET BY MOUTH TWICE DAILY 60 tablet 0  . ipratropium (ATROVENT) 0.03 % nasal spray Take 1 to 2 sprays into each nostril up to 2 or 3 x day as needed 30 mL 99  . isosorbide mononitrate (IMDUR) 60 MG 24 hr tablet Take 1/2 to 1 tablet daily for BP 90 tablet 1  . labetalol (NORMODYNE) 300 MG tablet Take 1 tablet (300 mg total) by mouth 2 (two) times daily. 180 tablet 99  . levothyroxine (SYNTHROID) 100 MCG tablet Take 1 tablet daily on an empty stomach for 30 minutes 90 tablet 1  . losartan-hydrochlorothiazide (HYZAAR) 100-25 MG per tablet Take 1 tablet daily for  BP 90 tablet 1  . pantoprazole (PROTONIX) 40 MG tablet TAKE ONE TABLET BY MOUTH ONCE DAILY FOR  ACID  REFLUX 90 tablet 0  . pravastatin (PRAVACHOL) 20 MG tablet Take 20 mg by mouth daily.    . pravastatin (PRAVACHOL) 40 MG tablet TAKE ONE TABLET BY MOUTH AT BEDTIME 30 tablet 3   No current facility-administered medications on file prior to visit.   Medical History:  Past Medical History  Diagnosis Date  . Anemia   . Arthritis   . Myelodysplasia   . Hypertension   . HLD (hyperlipidemia)   . Hypothyroidism   . GERD (gastroesophageal reflux disease)   . Vitamin D deficiency    Allergies:  Allergies  Allergen Reactions  . Codeine Anaphylaxis  .  Penicillins Anaphylaxis  . Bactrim Itching and Swelling  . Clarithromycin Other (See Comments)    "Caused skin to peel off my hand."  . Flagyl [Metronidazole Hcl] Itching and Swelling  . Sulfa Antibiotics Itching and Swelling    Review of Systems:  Review of Systems  Constitutional: Negative.   HENT: Negative.   Eyes: Negative.   Respiratory: Negative.   Cardiovascular: Negative.   Gastrointestinal: Negative.   Genitourinary: Negative.   Musculoskeletal: Positive for joint pain. Negative for myalgias, back pain, falls and neck pain.       She also complains of bilateral bunions, would like to see podiatrist .  Skin: Negative.   Neurological: Negative.   Endo/Heme/Allergies: Negative.   Psychiatric/Behavioral: Negative.     Family history- Review and unchanged Social history- Review and unchanged Physical Exam: BP 122/70 mmHg  Pulse 64  Temp(Src) 97.3 F (36.3 C)  Resp 16  Ht 5\' 3"  (1.6 m)  Wt 169 lb 9.6 oz (76.93 kg)  BMI 30.05 kg/m2 Wt Readings from Last 3 Encounters:  06/02/15 169 lb 9.6 oz (76.93 kg)  03/31/15 170 lb 3.2 oz (77.202 kg)  03/27/15 169 lb 11.2 oz (76.975 kg)   General Appearance: Well nourished, in no apparent distress. Eyes: PERRLA, EOMs, conjunctiva no swelling or erythema Sinuses: No Frontal/maxillary tenderness ENT/Mouth: Ext aud canals clear, TMs without erythema, bulging. No erythema, swelling, or exudate on post pharynx.  Tonsils not swollen or erythematous. Hearing normal.  Neck: Supple, thyroid normal.  Respiratory: Respiratory effort normal, BS equal bilaterally without rales, rhonchi, wheezing or stridor.  Cardio: RRR with no MRGs. Brisk peripheral pulses without edema.  Abdomen: Soft, + BS,  Non tender, no guarding, rebound, hernias, masses. Lymphatics: Non tender without lymphadenopathy.  Musculoskeletal: Full ROM, 5/5 strength, Normal gait, bilateral 1st MTP of feet with bunions, decreased sensation left plantar foot compared to right.  Left hand with tenderness at MCPs and left MCP, PIP swelling/tenderness with catching, no warmth, redness. Good strength, sensation and pulses.  Skin: Warm, dry without rashes, lesions, ecchymosis.  Neuro: Cranial nerves intact. Normal muscle tone, no cerebellar symptoms. Psych: Awake and oriented X 3, normal affect, Insight and Judgment appropriate.    Vicie Mutters, PA-C 9:51 AM [facility]

## 2015-06-03 LAB — TSH: TSH: 2.881 u[IU]/mL (ref 0.350–4.500)

## 2015-06-04 ENCOUNTER — Other Ambulatory Visit: Payer: Self-pay | Admitting: Physician Assistant

## 2015-06-04 ENCOUNTER — Other Ambulatory Visit: Payer: Self-pay

## 2015-06-04 MED ORDER — AMITRIPTYLINE HCL 10 MG PO TABS: ORAL_TABLET | ORAL | Status: DC

## 2015-06-10 DIAGNOSIS — M65352 Trigger finger, left little finger: Secondary | ICD-10-CM | POA: Diagnosis not present

## 2015-06-19 ENCOUNTER — Other Ambulatory Visit (HOSPITAL_BASED_OUTPATIENT_CLINIC_OR_DEPARTMENT_OTHER): Payer: Medicare Other

## 2015-06-19 ENCOUNTER — Ambulatory Visit (HOSPITAL_BASED_OUTPATIENT_CLINIC_OR_DEPARTMENT_OTHER): Payer: Medicare Other | Admitting: Oncology

## 2015-06-19 ENCOUNTER — Ambulatory Visit (HOSPITAL_BASED_OUTPATIENT_CLINIC_OR_DEPARTMENT_OTHER): Payer: Medicare Other

## 2015-06-19 ENCOUNTER — Telehealth: Payer: Self-pay | Admitting: Oncology

## 2015-06-19 VITALS — BP 142/69 | HR 65 | Temp 98.3°F | Resp 17 | Ht 63.0 in | Wt 166.4 lb

## 2015-06-19 DIAGNOSIS — N183 Chronic kidney disease, stage 3 unspecified: Secondary | ICD-10-CM

## 2015-06-19 DIAGNOSIS — D631 Anemia in chronic kidney disease: Secondary | ICD-10-CM | POA: Diagnosis not present

## 2015-06-19 DIAGNOSIS — D469 Myelodysplastic syndrome, unspecified: Secondary | ICD-10-CM | POA: Diagnosis not present

## 2015-06-19 DIAGNOSIS — D649 Anemia, unspecified: Secondary | ICD-10-CM

## 2015-06-19 LAB — CBC WITH DIFFERENTIAL/PLATELET
BASO%: 0.2 % (ref 0.0–2.0)
BASOS ABS: 0 10*3/uL (ref 0.0–0.1)
EOS ABS: 0.1 10*3/uL (ref 0.0–0.5)
EOS%: 1 % (ref 0.0–7.0)
HCT: 30 % — ABNORMAL LOW (ref 34.8–46.6)
HEMOGLOBIN: 10.1 g/dL — AB (ref 11.6–15.9)
LYMPH#: 2.1 10*3/uL (ref 0.9–3.3)
LYMPH%: 42.8 % (ref 14.0–49.7)
MCH: 28.1 pg (ref 25.1–34.0)
MCHC: 33.7 g/dL (ref 31.5–36.0)
MCV: 83.6 fL (ref 79.5–101.0)
MONO#: 0.4 10*3/uL (ref 0.1–0.9)
MONO%: 8.6 % (ref 0.0–14.0)
NEUT#: 2.3 10*3/uL (ref 1.5–6.5)
NEUT%: 47.4 % (ref 38.4–76.8)
NRBC: 0 % (ref 0–0)
PLATELETS: 147 10*3/uL (ref 145–400)
RBC: 3.59 10*6/uL — ABNORMAL LOW (ref 3.70–5.45)
RDW: 15.3 % — ABNORMAL HIGH (ref 11.2–14.5)
WBC: 4.9 10*3/uL (ref 3.9–10.3)

## 2015-06-19 MED ORDER — DARBEPOETIN ALFA 300 MCG/0.6ML IJ SOSY
300.0000 ug | PREFILLED_SYRINGE | INTRAMUSCULAR | Status: DC
  Administered 2015-06-19: 300 ug via SUBCUTANEOUS
  Filled 2015-06-19: qty 0.6

## 2015-06-19 NOTE — Progress Notes (Signed)
Hematology and Oncology Follow Up Visit  Susan [name] [id number] 22-Dec-1940 74 y.o. 06/19/2015 10:10 AM [name],Susan DAVID, MDMcKeown, Susan Saxon, MD   Principle Diagnosis: This is a 74 year old woman with multifactorial anemia. 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  Secondary Diagnosis: Controlled hypertension.  Current therapy: She is on Aranesp 300 mcg every 3 weeks to keep her hemoglobin above 11.  Interim History:  Mrs. [name] presents today for a follow-up visit. Since her last visit, she continues to report no complaints. 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 She is tolerating Aranesp and have helped her overall quality of life and her performance status..   She really functions well when her hemoglobin is close to 11. She has not had any injection-related problems. Overall, her performance status and activity level remain at a reasonable range at this time. She denies bleeding, bruising or lymphadenopathy. He has not reported any fevers or chills or sweats. Has not reported any weight loss or constitutional symptoms. She denies any headaches or blurred vision or syncope. Her blood pressure have been under better control. She has not reported any chest pain or palpitation. Does not report any shortness of breath or wheezing. She does not report any nausea or vomiting or change in her bowel habits. She does not report any hematochezia or melena.  Rest of her review of systems unremarkable.  Medications: I have reviewed the patient's current medications.  Current Outpatient Prescriptions  Medication Sig Dispense Refill  . amitriptyline (ELAVIL) 10 MG tablet 1-3 pills at night for headache 90 tablet 0  . amLODipine (NORVASC) 10 MG tablet Take 1/2 to 1 tablet daily for BP 30 tablet 5  . aspirin 81 MG chewable tablet Chew 81 mg by mouth every morning.     .  Cholecalciferol (VITAMIN D-3) 5000 UNITS TABS Take 10,000 Units by mouth 2 (two) times daily.     . Darbepoetin Alfa-Albumin (ARANESP IJ) Inject as directed every 21 ( twenty-one) days.     . diclofenac (VOLTAREN) 75 MG EC tablet TAKE ONE TABLET BY MOUTH TWICE DAILY 60 tablet 0  . ipratropium (ATROVENT) 0.03 % nasal spray Take 1 to 2 sprays into each nostril up to 2 or 3 x day as needed 30 mL 99  . labetalol (NORMODYNE) 300 MG tablet Take 1 tablet (300 mg total) by mouth 2 (two) times daily. 180 tablet 99  . levothyroxine (SYNTHROID) 100 MCG tablet Take 1 tablet daily on an empty stomach for 30 minutes 90 tablet 1  . losartan-hydrochlorothiazide (HYZAAR) 100-25 MG per tablet Take 1 tablet daily for BP 90 tablet 1  . pantoprazole (PROTONIX) 40 MG tablet TAKE ONE TABLET BY MOUTH ONCE DAILY FOR  ACID  REFLUX 90 tablet 0  . pravastatin (PRAVACHOL) 10 MG tablet Take 10 mg by mouth daily.     No current facility-administered medications for this visit.    Allergies:  Allergies  Allergen Reactions  . Codeine Anaphylaxis  . Penicillins Anaphylaxis  . Bactrim Itching and Swelling  . Clarithromycin Other (See Comments)    "Caused skin to peel off my hand."  . Flagyl [Metronidazole Hcl] Itching and Swelling  . Sulfa Antibiotics Itching and Swelling    Past Medical History, Surgical history, Social history, and Family History were reviewed and updated.  [name]  Physical Exam: Blood pressure 142/69, pulse 65, temperature 98.3 F (36.8 C), temperature source  Oral, resp. rate 17, height '5\' 3"'  (1.6 m), weight 166 lb 6.4 oz (75.479 kg), SpO2 100 %. ECOG: 1 General appearance: alert and cooperative appeared without distress. Head: Normocephalic, without obvious abnormality Neck: no adenopathy Lymph nodes: Cervical, supraclavicular, and axillary nodes normal. Heart:regular rate and rhythm, S1, S2 normal, no murmur, click, rub or gallop Lung:chest clear, no wheezing, rales, normal symmetric air  entry Abdomen: soft, non-tender, without masses or organomegaly no shifting dullness or ascites. EXT:no erythema, induration, or nodules   Lab Results: Lab Results  Component Value Date   WBC 4.9 06/19/2015   HGB 10.1* 06/19/2015   HCT 30.0* 06/19/2015   MCV 83.6 06/19/2015   PLT 147 06/19/2015     Chemistry      Component Value Date/Time   NA 141 06/02/2015 1006   K 3.5 06/02/2015 1006   CL 108 06/02/2015 1006   CO2 25 06/02/2015 1006   BUN 29* 06/02/2015 1006   CREATININE 1.48* 06/02/2015 1006   CREATININE 1.87* 02/21/2015 2219      Component Value Date/Time   CALCIUM 9.3 06/02/2015 1006   ALKPHOS 85 06/02/2015 1006   AST 18 06/02/2015 1006   ALT 10 06/02/2015 1006   BILITOT 0.7 06/02/2015 1006     Impression and Plan:  This is a pleasant 74 year old female with the following issues:  1. Multifactorial anemia: She has an element of anemia of renal disease as well as anemia of possible myelodysplasia. Hemoglobin is 10.1 today and she will receive Aranesp.The plan is to keep her on Aranesp 300 mcg every 3 weeks to keep her hemoglobin above 11.  2. Hypertension:  Her blood pressure under excellent control today. 3. History of questionable myelodysplasia: No major changes in her counts and will continue to monitor this. We will consider repeat bone marrow biopsy in the future if she develops any further cytopenias. 4. Follow up. Every 3 weeks for CBC and injection. Visit in about 3 months.       [facility] MD 9/16/201610:10 AM

## 2015-06-19 NOTE — Telephone Encounter (Signed)
per pof to sch pt appt-gave pt copy of avs °

## 2015-06-24 ENCOUNTER — Other Ambulatory Visit: Payer: Self-pay | Admitting: *Deleted

## 2015-06-24 ENCOUNTER — Other Ambulatory Visit: Payer: Self-pay

## 2015-06-24 MED ORDER — PANTOPRAZOLE SODIUM 40 MG PO TBEC: DELAYED_RELEASE_TABLET | ORAL | Status: DC

## 2015-06-24 MED ORDER — DICLOFENAC SODIUM 75 MG PO TBEC: 75.0000 mg | DELAYED_RELEASE_TABLET | Freq: Two times a day (BID) | ORAL | Status: DC

## 2015-06-30 ENCOUNTER — Encounter: Payer: Self-pay | Admitting: Podiatry

## 2015-06-30 ENCOUNTER — Ambulatory Visit (INDEPENDENT_AMBULATORY_CARE_PROVIDER_SITE_OTHER): Payer: Medicare Other | Admitting: Podiatry

## 2015-06-30 VITALS — BP 117/66 | HR 64 | Resp 12

## 2015-06-30 DIAGNOSIS — B351 Tinea unguium: Secondary | ICD-10-CM | POA: Diagnosis not present

## 2015-06-30 DIAGNOSIS — E119 Type 2 diabetes mellitus without complications: Secondary | ICD-10-CM | POA: Diagnosis not present

## 2015-06-30 DIAGNOSIS — M79673 Pain in unspecified foot: Secondary | ICD-10-CM

## 2015-06-30 NOTE — Patient Instructions (Signed)
Diabetes and Foot Care Diabetes may cause you to have problems because of poor blood supply (circulation) to your feet and legs. This may cause the skin on your feet to become thinner, break easier, and heal more slowly. Your skin may become dry, and the skin may peel and crack. You may also have nerve damage in your legs and feet causing decreased feeling in them. You may not notice minor injuries to your feet that could lead to infections or more serious problems. Taking care of your feet is one of the most important things you can do for yourself.  HOME CARE INSTRUCTIONS  Wear shoes at all times, even in the house. Do not go barefoot. Bare feet are easily injured.  Check your feet daily for blisters, cuts, and redness. If you cannot see the bottom of your feet, use a mirror or ask someone for help.  Wash your feet with warm water (do not use hot water) and mild soap. Then pat your feet and the areas between your toes until they are completely dry. Do not soak your feet as this can dry your skin.  Apply a moisturizing lotion or petroleum jelly (that does not contain alcohol and is unscented) to the skin on your feet and to dry, brittle toenails. Do not apply lotion between your toes.  Trim your toenails straight across. Do not dig under them or around the cuticle. File the edges of your nails with an emery board or nail file.  Do not cut corns or calluses or try to remove them with medicine.  Wear clean socks or stockings every day. Make sure they are not too tight. Do not wear knee-high stockings since they may decrease blood flow to your legs.  Wear shoes that fit properly and have enough cushioning. To break in new shoes, wear them for just a few hours a day. This prevents you from injuring your feet. Always look in your shoes before you put them on to be sure there are no objects inside.  Do not cross your legs. This may decrease the blood flow to your feet.  If you find a minor scrape,  cut, or break in the skin on your feet, keep it and the skin around it clean and dry. These areas may be cleansed with mild soap and water. Do not cleanse the area with peroxide, alcohol, or iodine.  When you remove an adhesive bandage, be sure not to damage the skin around it.  If you have a wound, look at it several times a day to make sure it is healing.  Do not use heating pads or hot water bottles. They may burn your skin. If you have lost feeling in your feet or legs, you may not know it is happening until it is too late.  Make sure your health care provider performs a complete foot exam at least annually or more often if you have foot problems. Report any cuts, sores, or bruises to your health care provider immediately. SEEK MEDICAL CARE IF:   You have an injury that is not healing.  You have cuts or breaks in the skin.  You have an ingrown nail.  You notice redness on your legs or feet.  You feel burning or tingling in your legs or feet.  You have pain or cramps in your legs and feet.  Your legs or feet are numb.  Your feet always feel cold. SEEK IMMEDIATE MEDICAL CARE IF:   There is increasing redness,   swelling, or pain in or around a wound.  There is a red line that goes up your leg.  Pus is coming from a wound.  You develop a fever or as directed by your health care provider.  You notice a bad smell coming from an ulcer or wound. Document Released: 09/16/2000 Document Revised: 05/22/2013 Document Reviewed: 02/26/2013 Exit[employer] Patient Information 2015 [employer]. This information is not intended to replace advice given to you by your health care provider. Make sure you discuss any questions you have with your health care provider.  

## 2015-06-30 NOTE — Progress Notes (Signed)
   Subjective:    Patient ID: Susan [name], female    DOB: [birth year], 74 y.o.   MRN: [id number]  HPI    This patient presents today concerned about color changes in her toenails right and left feet gradually worsening over time. She also describes a gradual thickening of her toenails over time. She describes occasional pedicures in the past. She denies pain in the toenail areas and right and left feet. Patient has recently been diagnosed with prediabetes, however, is not taking any medication  Patient denies history of foot ulceration or claudication or amputation  Review of Systems  Musculoskeletal: Positive for joint swelling.  Skin: Positive for color change.       Objective:   Physical Exam  Orientated 3  Vascular: No peripheral edema noted bilaterally DP and PT pulses 2/4 bilaterally Capillary reflex immediate laterally  Neurological: Sensation to 10 g monofilament wire intact 5/5 bilaterally Vibratory sensation reactive bilaterally Ankle reflex equal and reactive bilaterally  Dermatological: Plantar keratoses subsecond MPJ right third MPJ left The toenails are incurvated, hypertrophic, discolored 6-10  Musculoskeletal: HAV deformities bilaterally Hammertoe second bilaterally There is no restriction ankle, subtalar, midtarsal joints bilaterally      Assessment & Plan:   Assessment: 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 I debrided the toenails 10 mechanically an electrical without a bleeding. Plantar keratoses 2 are debrided without a bleeding  Reappoint when necessary or in 3-4 month intervals

## 2015-07-07 ENCOUNTER — Emergency Department (HOSPITAL_COMMUNITY): Payer: Medicare Other

## 2015-07-07 ENCOUNTER — Emergency Department (HOSPITAL_COMMUNITY)
Admission: EM | Admit: 2015-07-07 | Discharge: 2015-07-07 | Disposition: A | Discharge status: Home / Self Care | Payer: Medicare Other | Attending: Emergency Medicine | Admitting: Emergency Medicine

## 2015-07-07 ENCOUNTER — Encounter (HOSPITAL_COMMUNITY): Payer: Self-pay | Admitting: *Deleted

## 2015-07-07 DIAGNOSIS — J189 Pneumonia, unspecified organism: Secondary | ICD-10-CM | POA: Diagnosis not present

## 2015-07-07 DIAGNOSIS — Z791 Long term (current) use of non-steroidal anti-inflammatories (NSAID): Secondary | ICD-10-CM | POA: Insufficient documentation

## 2015-07-07 DIAGNOSIS — Z862 Personal history of diseases of the blood and blood-forming organs and certain disorders involving the immune mechanism: Secondary | ICD-10-CM | POA: Diagnosis not present

## 2015-07-07 DIAGNOSIS — Z7982 Long term (current) use of aspirin: Secondary | ICD-10-CM | POA: Insufficient documentation

## 2015-07-07 DIAGNOSIS — E039 Hypothyroidism, unspecified: Secondary | ICD-10-CM | POA: Insufficient documentation

## 2015-07-07 DIAGNOSIS — M1990 Unspecified osteoarthritis, unspecified site: Secondary | ICD-10-CM | POA: Diagnosis not present

## 2015-07-07 DIAGNOSIS — Z87891 Personal history of nicotine dependence: Secondary | ICD-10-CM | POA: Diagnosis not present

## 2015-07-07 DIAGNOSIS — K219 Gastro-esophageal reflux disease without esophagitis: Secondary | ICD-10-CM | POA: Insufficient documentation

## 2015-07-07 DIAGNOSIS — E559 Vitamin D deficiency, unspecified: Secondary | ICD-10-CM | POA: Diagnosis not present

## 2015-07-07 DIAGNOSIS — I10 Essential (primary) hypertension: Secondary | ICD-10-CM | POA: Diagnosis not present

## 2015-07-07 DIAGNOSIS — R072 Precordial pain: Secondary | ICD-10-CM | POA: Diagnosis not present

## 2015-07-07 DIAGNOSIS — Z880 Allergy status to penicillin: Secondary | ICD-10-CM | POA: Insufficient documentation

## 2015-07-07 DIAGNOSIS — Z792 Long term (current) use of antibiotics: Secondary | ICD-10-CM | POA: Insufficient documentation

## 2015-07-07 DIAGNOSIS — R197 Diarrhea, unspecified: Secondary | ICD-10-CM | POA: Diagnosis not present

## 2015-07-07 DIAGNOSIS — E785 Hyperlipidemia, unspecified: Secondary | ICD-10-CM | POA: Insufficient documentation

## 2015-07-07 DIAGNOSIS — D649 Anemia, unspecified: Secondary | ICD-10-CM | POA: Insufficient documentation

## 2015-07-07 DIAGNOSIS — Z79899 Other long term (current) drug therapy: Secondary | ICD-10-CM | POA: Diagnosis not present

## 2015-07-07 DIAGNOSIS — R079 Chest pain, unspecified: Secondary | ICD-10-CM | POA: Diagnosis present

## 2015-07-07 LAB — URINALYSIS, ROUTINE W REFLEX MICROSCOPIC
Glucose, UA: NEGATIVE mg/dL
Hgb urine dipstick: NEGATIVE
Ketones, ur: NEGATIVE mg/dL
LEUKOCYTES UA: NEGATIVE
NITRITE: NEGATIVE
PROTEIN: NEGATIVE mg/dL
Specific Gravity, Urine: 1.022 (ref 1.005–1.030)
UROBILINOGEN UA: 1 mg/dL (ref 0.0–1.0)
pH: 5 (ref 5.0–8.0)

## 2015-07-07 LAB — I-STAT TROPONIN, ED: Troponin i, poc: 0 ng/mL (ref 0.00–0.08)

## 2015-07-07 LAB — CBC
HEMATOCRIT: 30.7 % — AB (ref 36.0–46.0)
Hemoglobin: 10.4 g/dL — ABNORMAL LOW (ref 12.0–15.0)
MCH: 29.1 pg (ref 26.0–34.0)
MCHC: 33.9 g/dL (ref 30.0–36.0)
MCV: 86 fL (ref 78.0–100.0)
Platelets: 125 10*3/uL — ABNORMAL LOW (ref 150–400)
RBC: 3.57 MIL/uL — AB (ref 3.87–5.11)
RDW: 16.3 % — ABNORMAL HIGH (ref 11.5–15.5)
WBC: 3.7 10*3/uL — AB (ref 4.0–10.5)

## 2015-07-07 LAB — BASIC METABOLIC PANEL
Anion gap: 8 (ref 5–15)
BUN: 28 mg/dL — ABNORMAL HIGH (ref 6–20)
CHLORIDE: 107 mmol/L (ref 101–111)
CO2: 23 mmol/L (ref 22–32)
Calcium: 9.4 mg/dL (ref 8.9–10.3)
Creatinine, Ser: 1.64 mg/dL — ABNORMAL HIGH (ref 0.44–1.00)
GFR calc non Af Amer: 30 mL/min — ABNORMAL LOW (ref >60)
GFR, EST AFRICAN AMERICAN: 34 mL/min — AB (ref >60)
Glucose, Bld: 104 mg/dL — ABNORMAL HIGH (ref 65–99)
POTASSIUM: 3.9 mmol/L (ref 3.5–5.1)
SODIUM: 138 mmol/L (ref 135–145)

## 2015-07-07 MED ORDER — MOXIFLOXACIN HCL 400 MG PO TABS: 400.0000 mg | ORAL_TABLET | Freq: Every day | ORAL | Status: DC

## 2015-07-07 NOTE — ED Provider Notes (Signed)
CSN: [id number]     Arrival date & time 07/07/15  1836 History   First MD Initiated Contact with Patient 07/07/15 2017     Chief Complaint  Patient presents with  . Dizziness  . Chest Pain     (Consider location/radiation/quality/duration/timing/severity/associated sxs/prior Treatment) Patient is a 74 y.o. female presenting with general illness. The history is provided by the patient and medical records.  Illness Location:  Generalizes Quality:  Lightheadedness, cough, rhinorrhea, soft nonbloody stool Severity:  Mild Onset quality:  Gradual Duration:  2 days Timing:  Constant Progression:  Worsening Chronicity:  New Context:  Has had several days of nasal congestion Associated symptoms: chest pain (substernal area, "tightness" rather than pain per pt), congestion, cough, diarrhea and rhinorrhea   Associated symptoms: no abdominal pain, no fever (has not checked but has chills), no loss of consciousness, no rash, no shortness of breath and no vomiting     Past Medical History  Diagnosis Date  . Anemia   . Arthritis   . Myelodysplasia   . Hypertension   . HLD (hyperlipidemia)   . Hypothyroidism   . GERD (gastroesophageal reflux disease)   . Vitamin D deficiency    Past Surgical History  Procedure Laterality Date  . Umbilical hernia repair    . Rotator cuff repair Left   . Inguinal hernia repair    . Carpal tunnel release Right   . Dilation and curettage of uterus     Family History  Problem Relation Age of Onset  . Stroke Father   . Hypertension Father   . Stroke Sister   . Coronary artery disease Brother   . Hypertension Sister   . Bladder Cancer Sister   . Cancer Mother    Social History  Substance Use Topics  . Smoking status: Former Smoker    Types: Cigarettes    Quit date: 10/02/1996  . Smokeless tobacco: Never Used  . Alcohol Use: 0.0 oz/week     Comment: glass of wine at least once a month.   OB History    No data available     Review of  Systems  Constitutional: Positive for chills. Negative for fever (has not checked but has chills).  HENT: Positive for congestion and rhinorrhea.   Eyes: Negative for visual disturbance.  Respiratory: Positive for cough. Negative for shortness of breath.   Cardiovascular: Positive for chest pain (substernal area, "tightness" rather than pain per pt).  Gastrointestinal: Positive for diarrhea. Negative for vomiting and abdominal pain.  Genitourinary: Negative for decreased urine volume.  Skin: Negative for rash.  Allergic/Immunologic: Negative for immunocompromised state.  Neurological: Positive for light-headedness. Negative for loss of consciousness.  Psychiatric/Behavioral: Negative for confusion.      Allergies  Codeine; Penicillins; Bactrim; Clarithromycin; Flagyl; and Sulfa antibiotics  Home Medications   Prior to Admission medications   Medication Sig Start Date End Date Taking? Authorizing Provider  amLODipine (NORVASC) 10 MG tablet Take 1/2 to 1 tablet daily for BP Patient taking differently: Take 5 mg by mouth at bedtime. Take 1/2 to 1 tablet daily for BP 03/09/15 09/08/15 Yes Unk Pinto, MD  aspirin 81 MG chewable tablet Chew 81 mg by mouth every morning.    Yes Historical Provider, MD  Cholecalciferol (VITAMIN D-3) 5000 UNITS TABS Take 10,000 Units by mouth daily.    Yes Historical Provider, MD  Darbepoetin Alfa-Albumin (ARANESP IJ) Inject as directed every 21 ( twenty-one) days.    Yes Historical Provider, MD  diclofenac (VOLTAREN) 75  MG EC tablet Take 1 tablet (75 mg total) by mouth 2 (two) times daily. 06/24/15  Yes Unk Pinto, MD  ipratropium (ATROVENT) 0.03 % nasal spray Take 1 to 2 sprays into each nostril up to 2 or 3 x day as needed Patient taking differently: Place 1 spray into both nostrils daily as needed for rhinitis. Take 1 to 2 sprays into each nostril up to 2 or 3 x day as needed 01/06/15  Yes Unk Pinto, MD  labetalol (NORMODYNE) 300 MG tablet Take 1  tablet (300 mg total) by mouth 2 (two) times daily. 06/01/15  Yes Unk Pinto, MD  levothyroxine (SYNTHROID) 100 MCG tablet Take 1 tablet daily on an empty stomach for 30 minutes Patient taking differently: Take 100 mcg by mouth daily before breakfast.  02/24/15 08/27/15 Yes Unk Pinto, MD  losartan-hydrochlorothiazide (HYZAAR) 100-25 MG per tablet Take 1 tablet daily for BP Patient taking differently: Take 0.5 tablets by mouth daily.  01/06/15 07/09/15 Yes Unk Pinto, MD  pantoprazole (PROTONIX) 40 MG tablet TAKE ONE TABLET BY MOUTH ONCE DAILY FOR  ACID  REFLUX 06/24/15  Yes Unk Pinto, MD  pravastatin (PRAVACHOL) 10 MG tablet Take 5 mg by mouth at bedtime.    Yes Historical Provider, MD  amitriptyline (ELAVIL) 10 MG tablet 1-3 pills at night for headache Patient not taking: Reported on 07/07/2015 06/04/15   Vicie Mutters, PA-C  moxifloxacin (AVELOX) 400 MG tablet Take 1 tablet (400 mg total) by mouth daily at 8 pm. 07/07/15   Ivin Booty, MD   BP 153/76 mmHg  Pulse 57  Temp(Src) 97.7 F (36.5 C) (Oral)  Resp 17  Ht 5\' 3"  (1.6 m)  Wt 166 lb (75.297 kg)  BMI 29.41 kg/m2  SpO2 100% Physical Exam  Constitutional: She is oriented to person, place, and time. She appears well-developed and well-nourished. No distress.  HENT:  Head: Normocephalic and atraumatic.  Eyes: Right eye exhibits no discharge. Left eye exhibits no discharge.  Neck: No tracheal deviation present.  Cardiovascular: Normal rate and regular rhythm.   Pulmonary/Chest: Effort normal. No respiratory distress.  Very slightly diminished at bases  Abdominal: Soft. She exhibits no distension. There is no tenderness.  Musculoskeletal: She exhibits no edema.  Neurological: She is alert and oriented to person, place, and time.  Skin: Skin is warm and dry.  Psychiatric: She has a normal mood and affect. Her behavior is normal.    ED Course  Procedures (including critical care time) Labs Review Labs Reviewed   BASIC METABOLIC PANEL - Abnormal; Notable for the following:    Glucose, Bld 104 (*)    BUN 28 (*)    Creatinine, Ser 1.64 (*)    GFR calc non Af Amer 30 (*)    GFR calc Af Amer 34 (*)    All other components within normal limits  CBC - Abnormal; Notable for the following:    WBC 3.7 (*)    RBC 3.57 (*)    Hemoglobin 10.4 (*)    HCT 30.7 (*)    RDW 16.3 (*)    Platelets 125 (*)    All other components within normal limits  URINALYSIS, ROUTINE W REFLEX MICROSCOPIC (NOT AT [facility]) - Abnormal; Notable for the following:    Color, Urine AMBER (*)    APPearance CLOUDY (*)    Bilirubin Urine SMALL (*)    All other components within normal limits  Randolm Idol, ED    Imaging Review Dg Chest 2 View  07/07/2015  CLINICAL DATA:  Chest pain starting today at 1pm, tightness, htn, former smoker  EXAM: CHEST  2 VIEW  COMPARISON:  02/21/2015  FINDINGS: Heart is upper limits normal. There is mild atelectasis versus early infiltrate involving the lingula. No pulmonary edema. Chronic compression fracture of L1.  IMPRESSION: 1. Atelectasis or infiltrate of the lingula. Followup PA and lateral chest X-ray is recommended in 3-4 weeks following trial of antibiotic therapy to ensure resolution and exclude underlying malignancy. 2. No pulmonary edema.   Electronically Signed   By: Nolon Nations M.D.   On: 07/07/2015 19:28   I have personally reviewed and evaluated these images and lab results as part of my medical decision-making.   EKG Interpretation   Date/Time:  Tuesday July 07 2015 18:46:12 EDT Ventricular Rate:  61 PR Interval:  212 QRS Duration: 84 QT Interval:  436 QTC Calculation: 438 R Axis:   21 Text Interpretation:  Sinus rhythm with 1st degree A-V block Cannot rule  out Anterior infarct , age undetermined Abnormal ECG ED PHYSICIAN  INTERPRETATION AVAILABLE IN [facility] Confirmed by TEST, Record  ([id number]) on 07/08/2015 7:11:58 AM      MDM   Final diagnoses:  CAP  (community acquired pneumonia)    74 yo F with hx HTN, CKD, MDS presenting with generalized weakness/fatigue, lightheadedness with exertion, cough, chest tightness. Also endorses congestion, rhinorrhea, some looser stools recently. AF, VSS. Workup notable for CXR with early PNA. Stable o2 sats, no respiratory distress, able to ambulate with steady gait independently without desaturation. Will tx as outpt with abx for CAP. Return precautions given.  Case discussed with Dr. Laneta Simmers who oversaw management of this patient.  Ivin Booty, MD 07/08/15 1533  Leo Grosser, MD 07/08/15 [phone]

## 2015-07-07 NOTE — Discharge Instructions (Signed)

## 2015-07-07 NOTE — ED Notes (Signed)
Pt in c/o dizziness, "indigestion", also a few episodes of diarrhea, all symptoms started in the last few hours, alert and oriented, no distress noted

## 2015-07-10 ENCOUNTER — Ambulatory Visit (HOSPITAL_BASED_OUTPATIENT_CLINIC_OR_DEPARTMENT_OTHER): Payer: Medicare Other

## 2015-07-10 ENCOUNTER — Other Ambulatory Visit (HOSPITAL_BASED_OUTPATIENT_CLINIC_OR_DEPARTMENT_OTHER): Payer: Medicare Other

## 2015-07-10 VITALS — BP 119/61 | HR 99 | Temp 97.9°F | Resp 20

## 2015-07-10 DIAGNOSIS — D631 Anemia in chronic kidney disease: Secondary | ICD-10-CM | POA: Diagnosis not present

## 2015-07-10 DIAGNOSIS — D649 Anemia, unspecified: Secondary | ICD-10-CM

## 2015-07-10 DIAGNOSIS — N183 Chronic kidney disease, stage 3 unspecified: Secondary | ICD-10-CM

## 2015-07-10 DIAGNOSIS — D469 Myelodysplastic syndrome, unspecified: Secondary | ICD-10-CM

## 2015-07-10 LAB — CBC WITH DIFFERENTIAL/PLATELET
BASO%: 0.4 % (ref 0.0–2.0)
BASOS ABS: 0 10*3/uL (ref 0.0–0.1)
EOS ABS: 0.1 10*3/uL (ref 0.0–0.5)
EOS%: 1.8 % (ref 0.0–7.0)
HCT: 32.5 % — ABNORMAL LOW (ref 34.8–46.6)
HEMOGLOBIN: 10.8 g/dL — AB (ref 11.6–15.9)
LYMPH%: 42.8 % (ref 14.0–49.7)
MCH: 29.1 pg (ref 25.1–34.0)
MCHC: 33.3 g/dL (ref 31.5–36.0)
MCV: 87.4 fL (ref 79.5–101.0)
MONO#: 0.2 10*3/uL (ref 0.1–0.9)
MONO%: 7.4 % (ref 0.0–14.0)
NEUT#: 1.6 10*3/uL (ref 1.5–6.5)
NEUT%: 47.6 % (ref 38.4–76.8)
Platelets: 124 10*3/uL — ABNORMAL LOW (ref 145–400)
RBC: 3.72 10*6/uL (ref 3.70–5.45)
RDW: 16.6 % — ABNORMAL HIGH (ref 11.2–14.5)
WBC: 3.3 10*3/uL — ABNORMAL LOW (ref 3.9–10.3)
lymph#: 1.4 10*3/uL (ref 0.9–3.3)

## 2015-07-10 LAB — TECHNOLOGIST REVIEW

## 2015-07-10 MED ORDER — DARBEPOETIN ALFA 300 MCG/0.6ML IJ SOSY
300.0000 ug | PREFILLED_SYRINGE | INTRAMUSCULAR | Status: DC
  Administered 2015-07-10: 300 ug via SUBCUTANEOUS
  Filled 2015-07-10: qty 0.6

## 2015-07-10 NOTE — Patient Instructions (Signed)
Darbepoetin Alfa injection What is this medicine? DARBEPOETIN ALFA (dar be POE e tin AL fa) helps your body make more red blood cells. It is used to treat anemia caused by chronic kidney failure and chemotherapy. This medicine may be used for other purposes; ask your health care provider or pharmacist if you have questions. COMMON BRAND NAME(S): Aranesp What should I tell my health care provider before I take this medicine? They need to know if you have any of these conditions: -blood clotting disorders or history of blood clots -cancer patient not on chemotherapy -cystic fibrosis -heart disease, such as angina, heart failure, or a history of a heart attack -hemoglobin level of 12 g/dL or greater -high blood pressure -low levels of folate, iron, or vitamin B12 -seizures -an unusual or allergic reaction to darbepoetin, erythropoietin, albumin, hamster proteins, latex, other medicines, foods, dyes, or preservatives -pregnant or trying to get pregnant -breast-feeding How should I use this medicine? This medicine is for injection into a vein or under the skin. It is usually given by a health care professional in a hospital or clinic setting. If you get this medicine at home, you will be taught how to prepare and give this medicine. Do not shake the solution before you withdraw a dose. Use exactly as directed. Take your medicine at regular intervals. Do not take your medicine more often than directed. It is important that you put your used needles and syringes in a special sharps container. Do not put them in a trash can. If you do not have a sharps container, call your pharmacist or healthcare provider to get one. Talk to your pediatrician regarding the use of this medicine in children. While this medicine may be used in children as young as 1 year for selected conditions, precautions do apply. Overdosage: If you think you have taken too much of this medicine contact a poison control center or  emergency room at once. NOTE: This medicine is only for you. Do not share this medicine with others. What if I miss a dose? If you miss a dose, take it as soon as you can. If it is almost time for your next dose, take only that dose. Do not take double or extra doses. What may interact with this medicine? Do not take this medicine with any of the following medications: -epoetin alfa This list may not describe all possible interactions. Give your health care provider a list of all the medicines, herbs, non-prescription drugs, or dietary supplements you use. Also tell them if you smoke, drink alcohol, or use illegal drugs. Some items may interact with your medicine. What should I watch for while using this medicine? Visit your prescriber or health care professional for regular checks on your progress and for the needed blood tests and blood pressure measurements. It is especially important for the doctor to make sure your hemoglobin level is in the desired range, to limit the risk of potential side effects and to give you the best benefit. Keep all appointments for any recommended tests. Check your blood pressure as directed. Ask your doctor what your blood pressure should be and when you should contact him or her. As your body makes more red blood cells, you may need to take iron, folic acid, or vitamin B supplements. Ask your doctor or health care provider which products are right for you. If you have kidney disease continue dietary restrictions, even though this medication can make you feel better. Talk with your doctor or health   care professional about the foods you eat and the vitamins that you take. What side effects may I notice from receiving this medicine? Side effects that you should report to your doctor or health care professional as soon as possible: -allergic reactions like skin rash, itching or hives, swelling of the face, lips, or tongue -breathing problems -changes in vision -chest  pain -confusion, trouble speaking or understanding -feeling faint or lightheaded, falls -high blood pressure -muscle aches or pains -pain, swelling, warmth in the leg -rapid weight gain -severe headaches -sudden numbness or weakness of the face, arm or leg -trouble walking, dizziness, loss of balance or coordination -seizures (convulsions) -swelling of the ankles, feet, hands -unusually weak or tired Side effects that usually do not require medical attention (report to your doctor or health care professional if they continue or are bothersome): -diarrhea -fever, chills (flu-like symptoms) -headaches -nausea, vomiting -redness, stinging, or swelling at site where injected This list may not describe all possible side effects. Call your doctor for medical advice about side effects. You may report side effects to FDA at 1-800-FDA-1088. Where should I keep my medicine? Keep out of the reach of children. Store in a refrigerator between 2 and 8 degrees C (36 and 46 degrees F). Do not freeze. Do not shake. Throw away any unused portion if using a single-dose vial. Throw away any unused medicine after the expiration date. NOTE: This sheet is a summary. It may not cover all possible information. If you have questions about this medicine, talk to your doctor, pharmacist, or health care provider.  2015, [employer]. (2008-09-02 10:23:57)  

## 2015-07-15 ENCOUNTER — Encounter: Payer: Self-pay | Admitting: Internal Medicine

## 2015-07-15 ENCOUNTER — Ambulatory Visit (INDEPENDENT_AMBULATORY_CARE_PROVIDER_SITE_OTHER): Payer: Medicare Other | Admitting: Internal Medicine

## 2015-07-15 VITALS — BP 122/72 | HR 68 | Temp 97.7°F | Resp 18 | Ht 63.0 in | Wt 169.8 lb

## 2015-07-15 DIAGNOSIS — J189 Pneumonia, unspecified organism: Secondary | ICD-10-CM

## 2015-07-15 NOTE — Patient Instructions (Signed)
Please use nasal saline as often as you can tolerate.    Please make sure that you take benadryl at night time to help dry the rest of your congestion up.  Please make sure that your are drinking plenty of water.   Please make sure that you finish your antibiotic.  Slow incorporate your regular daily activities and going out back into our routine.  If you need to rest, rest.  You can go back to class.

## 2015-07-15 NOTE — Progress Notes (Signed)
   Subjective:    Patient ID: Susan [name], female    DOB: 14-May-1941, 74 y.o.   MRN: [id number]  Pneumonia There is no cough, shortness of breath or wheezing. Pertinent negatives include no appetite change, chest pain or fever.   Patient presents to the office for evaluation of post hospitalization PNA.  She went to the hospital on  07/07/15 with headache and also with chest pain, weakness and severe shortness of breath.  Patient sent home on abx for CAP.  She is taking avelox for it.  She reports that she has two days left of her antibiotic.  She reports that she still has runny nose and congestion.  She reports that she still feels a little dizzy and light headed occasionally.  Coughing and shortness of breath considerably better.  She reports thtat she is still very fatigued and is staying mostly at home.     Review of Systems  Constitutional: Positive for fatigue. Negative for fever, chills, activity change and appetite change.  Respiratory: Negative for cough, chest tightness, shortness of breath and wheezing.   Cardiovascular: Negative for chest pain, palpitations and leg swelling.  Gastrointestinal: Negative for nausea, vomiting, abdominal pain, diarrhea and constipation.  Neurological: Positive for light-headedness. Negative for dizziness and weakness.  Psychiatric/Behavioral: Negative for confusion and agitation.       Objective:   Physical Exam  Constitutional: She is oriented to person, place, and time. She appears well-developed and well-nourished. No distress.  HENT:  Head: Normocephalic.  Mouth/Throat: Oropharynx is clear and moist. No oropharyngeal exudate.  Eyes: Conjunctivae are normal. No scleral icterus.  Neck: Normal range of motion. Neck supple. No JVD present. No thyromegaly present.  Pulmonary/Chest: Effort normal. No respiratory distress. She has no decreased breath sounds. She has no wheezes. She has rhonchi in the left lower field. She has no rales. She  exhibits no tenderness.  Abdominal: Soft. Bowel sounds are normal. She exhibits no distension and no mass. There is no tenderness. There is no rebound and no guarding.  Musculoskeletal: Normal range of motion.  Lymphadenopathy:    She has no cervical adenopathy.  Neurological: She is alert and oriented to person, place, and time.  Skin: Skin is warm and dry. She is not diaphoretic.  Psychiatric: She has a normal mood and affect. Her behavior is normal. Judgment and thought content normal.  Nursing note and vitals reviewed.   Filed Vitals:   07/15/15 0933  BP: 122/72  Pulse: 68  Temp: 97.7 F (36.5 C)  Resp: 18         Assessment & Plan:    1. CAP (community acquired pneumonia) -finish avelox -probiotics -nasal saline -benadryl for nasal congestion - DG Chest 2 View; Future to be done in approximately 1 month

## 2015-07-22 DIAGNOSIS — M65352 Trigger finger, left little finger: Secondary | ICD-10-CM | POA: Diagnosis not present

## 2015-07-31 ENCOUNTER — Ambulatory Visit: Payer: Medicare Other

## 2015-07-31 ENCOUNTER — Other Ambulatory Visit (HOSPITAL_BASED_OUTPATIENT_CLINIC_OR_DEPARTMENT_OTHER): Payer: Medicare Other

## 2015-07-31 DIAGNOSIS — N183 Chronic kidney disease, stage 3 unspecified: Secondary | ICD-10-CM

## 2015-07-31 DIAGNOSIS — D469 Myelodysplastic syndrome, unspecified: Secondary | ICD-10-CM | POA: Diagnosis present

## 2015-07-31 LAB — CBC WITH DIFFERENTIAL/PLATELET
BASO%: 0.2 % (ref 0.0–2.0)
Basophils Absolute: 0 10*3/uL (ref 0.0–0.1)
EOS%: 1.3 % (ref 0.0–7.0)
Eosinophils Absolute: 0.1 10*3/uL (ref 0.0–0.5)
HEMATOCRIT: 33.8 % — AB (ref 34.8–46.6)
HEMOGLOBIN: 11.2 g/dL — AB (ref 11.6–15.9)
LYMPH#: 1.7 10*3/uL (ref 0.9–3.3)
LYMPH%: 38.2 % (ref 14.0–49.7)
MCH: 28.8 pg (ref 25.1–34.0)
MCHC: 33.1 g/dL (ref 31.5–36.0)
MCV: 86.9 fL (ref 79.5–101.0)
MONO#: 0.4 10*3/uL (ref 0.1–0.9)
MONO%: 7.7 % (ref 0.0–14.0)
NEUT%: 52.6 % (ref 38.4–76.8)
NEUTROS ABS: 2.4 10*3/uL (ref 1.5–6.5)
PLATELETS: 123 10*3/uL — AB (ref 145–400)
RBC: 3.89 10*6/uL (ref 3.70–5.45)
RDW: 16.2 % — AB (ref 11.2–14.5)
WBC: 4.6 10*3/uL (ref 3.9–10.3)

## 2015-07-31 MED ORDER — DARBEPOETIN ALFA 300 MCG/0.6ML IJ SOSY: 300.0000 ug | PREFILLED_SYRINGE | INTRAMUSCULAR | Status: DC

## 2015-08-03 ENCOUNTER — Other Ambulatory Visit: Payer: Self-pay | Admitting: Internal Medicine

## 2015-08-17 ENCOUNTER — Ambulatory Visit (INDEPENDENT_AMBULATORY_CARE_PROVIDER_SITE_OTHER): Payer: Medicare Other | Admitting: Physician Assistant

## 2015-08-17 ENCOUNTER — Ambulatory Visit (HOSPITAL_COMMUNITY)
Admission: RE | Admit: 2015-08-17 | Discharge: 2015-08-17 | Disposition: A | Discharge status: Home / Self Care | Payer: Medicare Other | Source: Ambulatory Visit | Attending: Physician Assistant | Admitting: Physician Assistant

## 2015-08-17 ENCOUNTER — Encounter: Payer: Self-pay | Admitting: Physician Assistant

## 2015-08-17 VITALS — BP 110/80 | HR 88 | Temp 98.1°F | Resp 14 | Ht 63.0 in | Wt 169.0 lb

## 2015-08-17 DIAGNOSIS — J989 Respiratory disorder, unspecified: Secondary | ICD-10-CM

## 2015-08-17 DIAGNOSIS — R062 Wheezing: Secondary | ICD-10-CM | POA: Insufficient documentation

## 2015-08-17 DIAGNOSIS — I10 Essential (primary) hypertension: Secondary | ICD-10-CM | POA: Insufficient documentation

## 2015-08-17 DIAGNOSIS — R059 Cough, unspecified: Secondary | ICD-10-CM

## 2015-08-17 DIAGNOSIS — R079 Chest pain, unspecified: Secondary | ICD-10-CM | POA: Diagnosis not present

## 2015-08-17 DIAGNOSIS — Z87891 Personal history of nicotine dependence: Secondary | ICD-10-CM | POA: Insufficient documentation

## 2015-08-17 DIAGNOSIS — R5383 Other fatigue: Secondary | ICD-10-CM | POA: Diagnosis not present

## 2015-08-17 DIAGNOSIS — R05 Cough: Secondary | ICD-10-CM

## 2015-08-17 MED ORDER — IPRATROPIUM-ALBUTEROL 0.5-2.5 (3) MG/3ML IN SOLN
3.0000 mL | Freq: Once | RESPIRATORY_TRACT | Status: AC
  Administered 2015-08-17: 3 mL via RESPIRATORY_TRACT

## 2015-08-17 MED ORDER — HYDROCODONE-ACETAMINOPHEN 5-325 MG PO TABS: 1.0000 | ORAL_TABLET | Freq: Four times a day (QID) | ORAL | Status: DC | PRN

## 2015-08-17 MED ORDER — PREDNISONE 20 MG PO TABS: ORAL_TABLET | ORAL | Status: DC

## 2015-08-17 MED ORDER — AZITHROMYCIN 250 MG PO TABS: ORAL_TABLET | ORAL | Status: AC

## 2015-08-17 NOTE — Progress Notes (Signed)
Subjective:    Patient ID: Susan [name], female    DOB: [birth year], 74 y.o.   MRN: [id number]  HPI 74 y.o. former smoking AAF with history of HTN, CKD, MDS, recent pneumonia in Oct treated with avelox presents with cough x 1 week with yellow mucus. Has been sweating more often, day or night, but denies fever, chills. Has been on theraflu without relief.   Wt Readings from Last 3 Encounters:  08/17/15 169 lb (76.658 kg)  07/15/15 169 lb 12.8 oz (77.021 kg)  07/07/15 166 lb (75.297 kg)   Blood pressure 110/80, pulse 88, temperature 98.1 F (36.7 C), temperature source Temporal, resp. rate 14, height 5\' 3"  (1.6 m), weight 169 lb (76.658 kg), SpO2 96 %.  Past Medical History  Diagnosis Date  . Anemia   . Arthritis   . Myelodysplasia   . Hypertension   . HLD (hyperlipidemia)   . Hypothyroidism   . GERD (gastroesophageal reflux disease)   . Vitamin D deficiency    Current Outpatient Prescriptions on File Prior to Visit  Medication Sig Dispense Refill  . amLODipine (NORVASC) 10 MG tablet Take 1/2 to 1 tablet daily for BP (Patient taking differently: Take 5 mg by mouth at bedtime. Take 1/2 to 1 tablet daily for BP) 30 tablet 5  . aspirin 81 MG chewable tablet Chew 81 mg by mouth every morning.     . Cholecalciferol (VITAMIN D-3) 5000 UNITS TABS Take 10,000 Units by mouth daily.     . Darbepoetin Alfa-Albumin (ARANESP IJ) Inject as directed every 21 ( twenty-one) days.     . diclofenac (VOLTAREN) 75 MG EC tablet TAKE 1 TABLET(75 MG) BY MOUTH TWICE DAILY 60 tablet 0  . ipratropium (ATROVENT) 0.03 % nasal spray Take 1 to 2 sprays into each nostril up to 2 or 3 x day as needed (Patient taking differently: Place 1 spray into both nostrils daily as needed for rhinitis. Take 1 to 2 sprays into each nostril up to 2 or 3 x day as needed) 30 mL 99  . labetalol (NORMODYNE) 300 MG tablet Take 1 tablet (300 mg total) by mouth 2 (two) times daily. 180 tablet 99  . levothyroxine (SYNTHROID) 100 MCG  tablet Take 1 tablet daily on an empty stomach for 30 minutes (Patient taking differently: Take 100 mcg by mouth daily before breakfast. ) 90 tablet 1  . moxifloxacin (AVELOX) 400 MG tablet Take 1 tablet (400 mg total) by mouth daily at 8 pm. 10 tablet 0  . pantoprazole (PROTONIX) 40 MG tablet TAKE ONE TABLET BY MOUTH ONCE DAILY FOR  ACID  REFLUX 90 tablet 1  . pravastatin (PRAVACHOL) 10 MG tablet Take 5 mg by mouth at bedtime.      No current facility-administered medications on file prior to visit.    Review of Systems  Constitutional: Positive for fatigue. Negative for fever, chills, activity change and appetite change.  HENT: Positive for congestion, postnasal drip, rhinorrhea, sinus pressure, sneezing and sore throat. Negative for dental problem, drooling, ear discharge, ear pain, facial swelling, hearing loss, mouth sores, nosebleeds, tinnitus, trouble swallowing and voice change.   Respiratory: Positive for cough and wheezing. Negative for chest tightness and shortness of breath.   Cardiovascular: Negative for chest pain, palpitations and leg swelling.  Gastrointestinal: Negative for nausea, vomiting, abdominal pain, diarrhea and constipation.  Neurological: Negative for dizziness, weakness and light-headedness.  Psychiatric/Behavioral: Negative for confusion and agitation.       Objective:   Physical  Exam  Constitutional: She is oriented to person, place, and time. She appears well-developed and well-nourished.  HENT:  Head: Normocephalic and atraumatic.  Right Ear: External ear normal.  Left Ear: External ear normal.  Nose: Right sinus exhibits frontal sinus tenderness. Left sinus exhibits frontal sinus tenderness.  Mouth/Throat: Oropharynx is clear and moist.  Eyes: Conjunctivae are normal. Pupils are equal, round, and reactive to light.  Neck: Normal range of motion. Neck supple.  Cardiovascular: Normal rate and regular rhythm.   Pulmonary/Chest: Effort normal. No  respiratory distress. She has wheezes (bilateral lower fields. ). She has no rales. She exhibits no tenderness.  Abdominal: Soft. Bowel sounds are normal.  Lymphadenopathy:    She has no cervical adenopathy.  Neurological: She is alert and oriented to person, place, and time.  Skin: Skin is warm and dry.       Assessment & Plan:  1. Cough- bronchitis versus recurrent CAP Improved after breathing treatment in the office, get CXR, if not better follow up in the office.  - azithromycin (ZITHROMAX) 250 MG tablet; Take 2 tablets (500 mg) on  Day 1,  followed by 1 tablet (250 mg) once daily on Days 2 through 5.  Dispense: 6 each; Refill: 1 - predniSONE (DELTASONE) 20 MG tablet; 2 tablets daily for 3 days, 1 tablet daily for 4 days.  Dispense: 10 tablet; Refill: 0 - HYDROcodone-acetaminophen (NORCO) 5-325 MG tablet; Take 1 tablet by mouth every 6 (six) hours as needed (Cough). Max: 4 tablets a day  Dispense: 10 tablet; Refill: 0 - ipratropium-albuterol (DUONEB) 0.5-2.5 (3) MG/3ML nebulizer solution 3 mL; Take 3 mLs by nebulization once. - DG Chest 2 View; Future

## 2015-08-17 NOTE — Patient Instructions (Signed)
I will give you a prescription for an antibiotic, but please only take it if you are not feeling better in 7-10 days.  Bronchitis is mostly caused by viruses and the antibiotic will do nothing.  PLEASE TRY TO DO OVER THE COUNTER TREATMENT AND/OR PREDNISONE FOR 5-7 DAYS AND IF YOU ARE NOT GETTING BETTER OR GETTING WORSE THEN YOU CAN START ON AN ANTIBIOTIC GIVEN.  Can take the prednisone AT NIGHT WITH DINNER, it take 8-12 hours to start working so it will NOT affect your sleeping if you take it at night with your food!! Take two pills the first night and 1 or two pill the second night and then 1 pill the other nights.    Rest and stay hydrated.  Make sure you drink plenty of fluids to make sure urine is clear when you urinate.  Water will help thin out mucous. - Take Mucinex DM- Maximum Strength over the counter to thin out and cough up the thick mucous.  Please follow directions on box. -Take Albuterol if prescribed.  Risk of antibiotic use: About 1 in 4 people who take antibiotics have side effects including stomach problems, dizziness, or rashes. Those problems clear up soon after stopping the drugs, but in rare cases antibiotics can cause severe allergic reaction. Over use of antibiotics also encourages the growth of bacteria that can't be controlled easily with drugs. That makes you more vunerable to antibiotic-resistant infections and undermines the benefits of antibiotics for others.   Waste of Money: Antibiotics often aren't very expensive, but any money spent on unnecessary drugs is money down the drain.   When are antibiotics needed? Only when symptoms last longer than a week.  Start to improve but then worsen again  Please call the office or message through My Chart if you have any questions.   Acute Bronchitis Bronchitis is when the airways that extend from the windpipe into the lungs get red, puffy, and painful (inflamed). Bronchitis often causes thick spit (mucus) to develop. This  leads to a cough. A cough is the most common symptom of bronchitis. In acute bronchitis, the condition usually begins suddenly and goes away over time (usually in 2 weeks). Smoking, allergies, and asthma can make bronchitis worse. Repeated episodes of bronchitis may cause more lung problems.  Most common cause of Bronchitis is viruses (rhinovirus, coronavirus, RSV).  Therefore, not requiring an antibiotic; as antibiotics only treat bacterial infections.  HOME CARE  Rest.  Drink enough fluids to keep your pee (urine) clear or pale yellow (unless you need to limit fluids as told by your doctor).  Only take over-the-counter or prescription medicines as told by your doctor.  Avoid smoking and secondhand smoke. These can make bronchitis worse. If you are a smoker, think about using nicotine gum or skin patches. Quitting smoking will help your lungs heal faster.  Reduce the chance of getting bronchitis again by:  Washing your hands often.  Avoiding people with cold symptoms.  Trying not to touch your hands to your mouth, nose, or eyes.  Follow up with your doctor as told. GET HELP IF: Your symptoms do not improve after 1 week of treatment. Symptoms include:  Cough.  Fever.  Coughing up thick spit.  Body aches.  Chest congestion.  Chills.  Shortness of breath.  Sore throat. GET HELP RIGHT AWAY IF:   You have an increased fever.  You have chills.  You have severe shortness of breath.  You have bloody thick spit (sputum).    You throw up (vomit) often.  You lose too much body fluid (dehydration).  You have a severe headache.  You faint. MAKE SURE YOU:   Understand these instructions.  Will watch your condition.  Will get help right away if you are not doing well or get worse. Document Released: 03/07/2008 Document Revised: 05/22/2013 Document Reviewed: 03/12/2013 [employer] Patient Information 2015 [employer]. This information is not intended to replace  advice given to you by your health care provider. Make sure you discuss any questions you have with your health care provider.   

## 2015-08-21 ENCOUNTER — Ambulatory Visit (HOSPITAL_BASED_OUTPATIENT_CLINIC_OR_DEPARTMENT_OTHER): Payer: Medicare Other

## 2015-08-21 ENCOUNTER — Other Ambulatory Visit (HOSPITAL_BASED_OUTPATIENT_CLINIC_OR_DEPARTMENT_OTHER): Payer: Medicare Other

## 2015-08-21 VITALS — BP 129/76 | HR 65 | Temp 98.6°F

## 2015-08-21 DIAGNOSIS — D469 Myelodysplastic syndrome, unspecified: Secondary | ICD-10-CM

## 2015-08-21 DIAGNOSIS — N183 Chronic kidney disease, stage 3 unspecified: Secondary | ICD-10-CM

## 2015-08-21 LAB — CBC WITH DIFFERENTIAL/PLATELET
BASO%: 0.6 % (ref 0.0–2.0)
Basophils Absolute: 0 10*3/uL (ref 0.0–0.1)
EOS%: 0.1 % (ref 0.0–7.0)
Eosinophils Absolute: 0 10*3/uL (ref 0.0–0.5)
HEMATOCRIT: 31.3 % — AB (ref 34.8–46.6)
HEMOGLOBIN: 10.6 g/dL — AB (ref 11.6–15.9)
LYMPH#: 2.6 10*3/uL (ref 0.9–3.3)
LYMPH%: 36.4 % (ref 14.0–49.7)
MCH: 28.9 pg (ref 25.1–34.0)
MCHC: 33.9 g/dL (ref 31.5–36.0)
MCV: 85.1 fL (ref 79.5–101.0)
MONO#: 0.3 10*3/uL (ref 0.1–0.9)
MONO%: 4.4 % (ref 0.0–14.0)
NEUT%: 58.5 % (ref 38.4–76.8)
NEUTROS ABS: 4.2 10*3/uL (ref 1.5–6.5)
PLATELETS: 237 10*3/uL (ref 145–400)
RBC: 3.68 10*6/uL — ABNORMAL LOW (ref 3.70–5.45)
RDW: 15.2 % — AB (ref 11.2–14.5)
WBC: 7.1 10*3/uL (ref 3.9–10.3)

## 2015-08-21 LAB — TECHNOLOGIST REVIEW

## 2015-08-21 MED ORDER — DARBEPOETIN ALFA 300 MCG/0.6ML IJ SOSY
300.0000 ug | PREFILLED_SYRINGE | INTRAMUSCULAR | Status: DC
  Administered 2015-08-21: 300 ug via SUBCUTANEOUS
  Filled 2015-08-21: qty 0.6

## 2015-08-31 ENCOUNTER — Other Ambulatory Visit: Payer: Self-pay

## 2015-08-31 DIAGNOSIS — E039 Hypothyroidism, unspecified: Secondary | ICD-10-CM

## 2015-08-31 MED ORDER — LEVOTHYROXINE SODIUM 100 MCG PO TABS
ORAL_TABLET | ORAL | Status: DC
Start: 2015-08-31 — End: 2016-04-19

## 2015-09-02 ENCOUNTER — Ambulatory Visit (INDEPENDENT_AMBULATORY_CARE_PROVIDER_SITE_OTHER): Payer: Medicare Other | Admitting: Internal Medicine

## 2015-09-02 ENCOUNTER — Encounter: Payer: Self-pay | Admitting: Internal Medicine

## 2015-09-02 VITALS — BP 118/86 | HR 76 | Temp 97.6°F | Resp 16 | Ht 63.0 in | Wt 172.4 lb

## 2015-09-02 DIAGNOSIS — Z1331 Encounter for screening for depression: Secondary | ICD-10-CM

## 2015-09-02 DIAGNOSIS — Z1389 Encounter for screening for other disorder: Secondary | ICD-10-CM

## 2015-09-02 DIAGNOSIS — Z6830 Body mass index (BMI) 30.0-30.9, adult: Secondary | ICD-10-CM

## 2015-09-02 DIAGNOSIS — E782 Mixed hyperlipidemia: Secondary | ICD-10-CM

## 2015-09-02 DIAGNOSIS — D469 Myelodysplastic syndrome, unspecified: Secondary | ICD-10-CM | POA: Diagnosis not present

## 2015-09-02 DIAGNOSIS — N183 Chronic kidney disease, stage 3 unspecified: Secondary | ICD-10-CM

## 2015-09-02 DIAGNOSIS — K219 Gastro-esophageal reflux disease without esophagitis: Secondary | ICD-10-CM | POA: Diagnosis not present

## 2015-09-02 DIAGNOSIS — Z79899 Other long term (current) drug therapy: Secondary | ICD-10-CM

## 2015-09-02 DIAGNOSIS — Z789 Other specified health status: Secondary | ICD-10-CM

## 2015-09-02 DIAGNOSIS — E559 Vitamin D deficiency, unspecified: Secondary | ICD-10-CM

## 2015-09-02 DIAGNOSIS — R7309 Other abnormal glucose: Secondary | ICD-10-CM | POA: Diagnosis not present

## 2015-09-02 DIAGNOSIS — Z6829 Body mass index (BMI) 29.0-29.9, adult: Secondary | ICD-10-CM | POA: Insufficient documentation

## 2015-09-02 DIAGNOSIS — I10 Essential (primary) hypertension: Secondary | ICD-10-CM | POA: Diagnosis not present

## 2015-09-02 DIAGNOSIS — E039 Hypothyroidism, unspecified: Secondary | ICD-10-CM | POA: Diagnosis not present

## 2015-09-02 DIAGNOSIS — R7303 Prediabetes: Secondary | ICD-10-CM | POA: Diagnosis not present

## 2015-09-02 DIAGNOSIS — Z9181 History of falling: Secondary | ICD-10-CM

## 2015-09-02 NOTE — Patient Instructions (Signed)

## 2015-09-02 NOTE — Progress Notes (Signed)
Patient ID: Susan [name], female   DOB: June 20, 1941, 74 y.o.   MRN: [id number]   This very nice 74 y.o. WBF presents for 6 month follow up with Hypertension, Hyperlipidemia, Pre-Diabetes and Vitamin D Deficiency. Patient has GERD controlled with diet & his meds. Patient also has hx of Myelodysplasia known since 1984 and had ha  been followed Dr Alen Blew on a 3 weekly schedule  Monitoring H&H for Aranesp/Epogen injection    Patient is treated for HTN circa 1980's  & BP has been controlled at home. Today's BP: 118/86 mmHg. Patient has had no complaints of any cardiac type chest pain, palpitations, dyspnea/orthopnea/PND, dizziness, claudication, or dependent edema.   Hyperlipidemia is controlled with diet & meds. Patient denies myalgias or other med SE's. Last Lipids were at goal 0 Cholesterol 155; HDL 56; LDL 83; Triglycerides 82 on 06/02/2015.   Also, the patient has history of  PreDiabetes and has had no symptoms of reactive hypoglycemia, diabetic polys, paresthesias or visual blurring.  Last A1c was  4.7% on 02/24/2015.   Further, the patient also has history of Vitamin D Deficiency of 27 in 2008 and supplements vitamin D without any suspected side-effects. Last vitamin D was  72 on 02/24/2015.   Medication Sig  . amLODipine  10 MG tablet Take 1/2 to 1 tablet daily for BP)  . aspirin 81 MG chewable tablet Chew 81 mg by mouth every morning.   [name] VITAMIN D 5000 UNITS Take 10,000 Units by mouth daily.   Kyra Searles IJ Inject as directed every 21 ( twenty-one) days.   . diclofenac  75 MG EC tablet TAKE 1 TAB TWICE DAILY  . NORCO 5-325 MG tablet Take 1 tablet by mouth every 6 (six) hours as needed (Cough). Max: 4 tablets a day  . ipratropium  0.03 % nasal spray Take 1 to 2 sprays into each nostril up to 2 or 3 x day as needed)  . labetalol (NORMODYNE) 300 MG tablet Take 1 tablet (300 mg total) by mouth 2 (two) times daily.  [name] levothyroxine  100 MCG tablet Take 1 tablet daily on an empty stomach for 30  minutes  . pantoprazole  40 MG tablet TAKE ONE TABLET BY MOUTH ONCE DAILY FOR  ACID  REFLUX  . pravastatin  10 MG tablet Take 5 mg by mouth at bedtime.    Allergies  Allergen Reactions  . Codeine Anaphylaxis  . Penicillins Anaphylaxis    Has patient had a PCN reaction causing immediate rash, facial/tongue/throat swelling, SOB or lightheadedness with hypotension: n Has patient had a PCN reaction causing severe rash involving mucus membranes or skin necrosis: no Has patient had a PCN reaction that required hospitalization no Has patient had a PCN reaction occurring within the last 10 years: no If all of the above answers are "NO", then may proceed with Cephalosporin use.  . Bactrim Itching and Swelling  . Clarithromycin Other (See Comments)    "Caused skin to peel off my hand."  . Flagyl [Metronidazole Hcl] Itching and Swelling  . Sulfa Antibiotics Itching and Swelling    PMHx:   Past Medical History  Diagnosis Date  . Anemia   . Arthritis   . Myelodysplasia   . Hypertension   . HLD (hyperlipidemia)   . Hypothyroidism   . GERD (gastroesophageal reflux disease)   . Vitamin D deficiency    Immunization History  Administered Date(s) Administered  . Td 10/03/2005   Past Surgical History  Procedure Laterality Date  .  Umbilical hernia repair    . Rotator cuff repair Left   . Inguinal hernia repair    . Carpal tunnel release Right   . Dilation and curettage of uterus     FHx:    Reviewed / unchanged  SHx:    Reviewed / unchanged  Systems Review:  Constitutional: Denies fever, chills, wt changes, headaches, insomnia, fatigue, night sweats, change in appetite. Eyes: Denies redness, blurred vision, diplopia, discharge, itchy, watery eyes.  ENT: Denies discharge, congestion, post nasal drip, epistaxis, sore throat, earache, hearing loss, dental pain, tinnitus, vertigo, sinus pain, snoring.  CV: Denies chest pain, palpitations, irregular heartbeat, syncope, dyspnea, diaphoresis,  orthopnea, PND, claudication or edema. Respiratory: denies cough, dyspnea, DOE, pleurisy, hoarseness, laryngitis, wheezing.  Gastrointestinal: Denies dysphagia, odynophagia, heartburn, reflux, water brash, abdominal pain or cramps, nausea, vomiting, bloating, diarrhea, constipation, hematemesis, melena, hematochezia  or hemorrhoids. Genitourinary: Denies dysuria, frequency, urgency, nocturia, hesitancy, discharge, hematuria or flank pain. Musculoskeletal: Denies arthralgias, myalgias, stiffness, jt. swelling, pain, limping or strain/sprain.  Skin: Denies pruritus, rash, hives, warts, acne, eczema or change in skin lesion(s). Neuro: No weakness, tremor, incoordination, spasms, paresthesia or pain. Psychiatric: Denies confusion, memory loss or sensory loss. Endo: Denies change in weight, skin or hair change.  Heme/Lymph: No excessive bleeding, bruising or enlarged lymph nodes.  Physical Exam  BP 118/86 mmHg  Pulse 76  Temp(Src) 97.6 F (36.4 C)  Resp 16  Ht 5\' 3"  (1.6 m)  Wt 172 lb 6.4 oz (78.2 kg)  BMI 30.55 kg/m2  Appears well nourished and in no distress. Eyes: PERRLA, EOMs, conjunctiva no swelling or erythema. Sinuses: No frontal/maxillary tenderness ENT/Mouth: EAC's clear, TM's nl w/o erythema, bulging. Nares clear w/o erythema, swelling, exudates. Oropharynx clear without erythema or exudates. Oral hygiene is good. Tongue normal, non obstructing. Hearing intact.  Neck: Supple. Thyroid nl. Car 2+/2+ without bruits, nodes or JVD. Chest: Respirations nl with BS clear & equal w/o rales, rhonchi, wheezing or stridor.  Cor: Heart sounds normal w/ regular rate and rhythm without sig. murmurs, gallops, clicks, or rubs. Peripheral pulses normal and equal  without edema.  Abdomen: Soft & bowel sounds normal. Non-tender w/o guarding, rebound, hernias, masses, or organomegaly.  Lymphatics: Unremarkable.  Musculoskeletal: Full ROM all peripheral extremities, joint stability, 5/5 strength, and  normal gait.  Skin: Warm, dry without exposed rashes, lesions or ecchymosis apparent.  Neuro: Cranial nerves intact, reflexes equal bilaterally. Sensory-motor testing grossly intact. Tendon reflexes grossly intact.  Pysch: Alert & oriented x 3.  Insight and judgement nl & appropriate. No ideations.  Assessment and Plan:  1. Essential hypertension  - TSH  2. Hyperlipidemia  - Lipid panel - TSH  3. Prediabetes  - Hemoglobin A1c - Insulin, random  4. Vitamin D deficiency  - VITAMIN D 25 Hydroxy   5. Hypothyroidism   6. Gastroesophageal reflux disease   7. 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HCC)   9. Other abnormal glucose  - Hemoglobin A1c - Insulin, random  10. Medication management  - CBC with Differential/Platelet - BASIC METABOLIC PANEL WITH GFR - Hepatic function panel - Magnesium  11. BMI 30.0-30.9,adult   12. At low risk for fall   13. Depression screen   Recommended regular exercise, BP monitoring, weight control, and discussed med and SE's. Recommended labs to assess and monitor clinical status. Further disposition pending results of labs. Over 30 minutes of exam, counseling, chart review was performed

## 2015-09-11 ENCOUNTER — Telehealth: Payer: Self-pay | Admitting: Oncology

## 2015-09-11 ENCOUNTER — Encounter: Payer: Self-pay | Admitting: *Deleted

## 2015-09-11 ENCOUNTER — Ambulatory Visit (HOSPITAL_BASED_OUTPATIENT_CLINIC_OR_DEPARTMENT_OTHER): Payer: Medicare Other

## 2015-09-11 ENCOUNTER — Other Ambulatory Visit: Payer: Medicare Other

## 2015-09-11 ENCOUNTER — Other Ambulatory Visit (HOSPITAL_BASED_OUTPATIENT_CLINIC_OR_DEPARTMENT_OTHER): Payer: Medicare Other

## 2015-09-11 ENCOUNTER — Ambulatory Visit (HOSPITAL_BASED_OUTPATIENT_CLINIC_OR_DEPARTMENT_OTHER): Payer: Medicare Other | Admitting: Oncology

## 2015-09-11 ENCOUNTER — Ambulatory Visit: Payer: Medicare Other

## 2015-09-11 VITALS — BP 144/69 | HR 72 | Temp 97.9°F | Resp 18 | Ht 63.0 in | Wt 170.7 lb

## 2015-09-11 DIAGNOSIS — D638 Anemia in other chronic diseases classified elsewhere: Secondary | ICD-10-CM

## 2015-09-11 DIAGNOSIS — D469 Myelodysplastic syndrome, unspecified: Secondary | ICD-10-CM

## 2015-09-11 LAB — CBC WITH DIFFERENTIAL/PLATELET
BASO%: 0.3 % (ref 0.0–2.0)
Basophils Absolute: 0 10*3/uL (ref 0.0–0.1)
EOS ABS: 0.1 10*3/uL (ref 0.0–0.5)
EOS%: 2.6 % (ref 0.0–7.0)
HCT: 31.9 % — ABNORMAL LOW (ref 34.8–46.6)
HGB: 10.8 g/dL — ABNORMAL LOW (ref 11.6–15.9)
LYMPH%: 47.2 % (ref 14.0–49.7)
MCH: 30.2 pg (ref 25.1–34.0)
MCHC: 33.9 g/dL (ref 31.5–36.0)
MCV: 89.1 fL (ref 79.5–101.0)
MONO#: 0.2 10*3/uL (ref 0.1–0.9)
MONO%: 6.3 % (ref 0.0–14.0)
NEUT#: 1.5 10*3/uL (ref 1.5–6.5)
NEUT%: 43.6 % (ref 38.4–76.8)
PLATELETS: 100 10*3/uL — AB (ref 145–400)
RBC: 3.58 10*6/uL — AB (ref 3.70–5.45)
RDW: 17.1 % — ABNORMAL HIGH (ref 11.2–14.5)
WBC: 3.5 10*3/uL — ABNORMAL LOW (ref 3.9–10.3)
lymph#: 1.7 10*3/uL (ref 0.9–3.3)

## 2015-09-11 MED ORDER — DARBEPOETIN ALFA 300 MCG/0.6ML IJ SOSY
300.0000 ug | PREFILLED_SYRINGE | INTRAMUSCULAR | Status: DC
  Administered 2015-09-11: 300 ug via SUBCUTANEOUS
  Filled 2015-09-11: qty 0.6

## 2015-09-11 NOTE — Progress Notes (Signed)
Hematology and Oncology Follow Up Visit  Dalena Plantz Wass [id number] [birth year] 74 y.o. 09/11/2015 12:51 PM [name],[name], MDMcKeown, [name], MD   Principle Diagnosis: This is a 74 year old woman with multifactorial anemia. 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  Secondary Diagnosis: Controlled hypertension.  Current therapy: She is on Aranesp 300 mcg every 3 weeks to keep her hemoglobin above 11.  Interim History:  Mrs. Stauder presents today for a follow-up visit. Since her last visit, she reports no new complaints. She reports slowly worsening arthritis in her back, hips and knees. She is having more difficulty ambulating for short distances because of it. He does report occasional dyspnea on exertion especially if her hemoglobin drifts down below 11. Despite that, she has a reasonable performance status and still able to drive. She still attending school at this time and volunteering church when she can. .She denies bleeding, bruising or lymphadenopathy. He has not reported any fevers or chills or sweats. Has not reported any weight loss or constitutional symptoms. She denies any headaches or blurred vision or syncope. She has not reported any chest pain or palpitation. Does not report any shortness of breath or wheezing. She does not report any nausea or vomiting or change in her bowel habits. She does not report any hematochezia or melena.  Rest of her review of systems unremarkable.  Medications: I have reviewed the patient's current medications.  Current Outpatient Prescriptions  Medication Sig Dispense Refill  . amLODipine (NORVASC) 10 MG tablet Take 1/2 to 1 tablet daily for BP (Patient taking differently: Take 5 mg by mouth at bedtime. Take 1/2 to 1 tablet daily for BP) 30 tablet 5  . aspirin 81 MG chewable tablet Chew 81 mg by mouth every morning.     . Cholecalciferol (VITAMIN D-3) 5000 UNITS TABS Take 10,000 Units by mouth daily.     .  Darbepoetin Alfa-Albumin (ARANESP IJ) Inject as directed every 21 ( twenty-one) days.     . diclofenac (VOLTAREN) 75 MG EC tablet TAKE 1 TABLET(75 MG) BY MOUTH TWICE DAILY 60 tablet 0  . HYDROcodone-acetaminophen (NORCO) 5-325 MG tablet Take 1 tablet by mouth every 6 (six) hours as needed (Cough). Max: 4 tablets a day 10 tablet 0  . ipratropium (ATROVENT) 0.03 % nasal spray Take 1 to 2 sprays into each nostril up to 2 or 3 x day as needed (Patient taking differently: Place 1 spray into both nostrils daily as needed for rhinitis. Take 1 to 2 sprays into each nostril up to 2 or 3 x day as needed) 30 mL 99  . labetalol (NORMODYNE) 300 MG tablet Take 1 tablet (300 mg total) by mouth 2 (two) times daily. 180 tablet 99  . levothyroxine (SYNTHROID) 100 MCG tablet Take 1 tablet daily on an empty stomach for 30 minutes 90 tablet 1  . pantoprazole (PROTONIX) 40 MG tablet TAKE ONE TABLET BY MOUTH ONCE DAILY FOR  ACID  REFLUX 90 tablet 1  . pravastatin (PRAVACHOL) 10 MG tablet Take 5 mg by mouth at bedtime.     . predniSONE (DELTASONE) 20 MG tablet 2 tablets daily for 3 days, 1 tablet daily for 4 days. 10 tablet 0   No current facility-administered medications for this visit.   Facility-Administered Medications Ordered in Other Visits  Medication Dose Route Frequency Provider Last Rate Last Dose  . Darbepoetin Alfa (ARANESP) injection 300 mcg  300 mcg Subcutaneous Q21 days Maryanna Shape, NP  Past Medical History, Surgical history, Social history, and Family History were reviewed and updated.  [name]  Physical Exam: Blood pressure 144/69, pulse 72, temperature 97.9 F (36.6 C), temperature source Oral, resp. rate 18, height _0  (1.6 m), weight 170 lb 11.2 oz (77.429 kg), SpO2 100 %. ECOG: 1 General appearance: alert and cooperative pleasant appearing woman without distress. Head: Normocephalic, without obvious abnormality no oral thrush. Neck: no adenopathy Lymph nodes: Cervical,  supraclavicular, and axillary nodes normal. Heart:regular rate and rhythm, S1, S2 normal, no murmur, click, rub or gallop Lung:chest clear, no wheezing, rales, normal symmetric air entry Abdomen: soft, non-tender, without masses or organomegaly no rebound or guarding. EXT:no erythema, induration, or nodules   Lab Results: Lab Results  Component Value Date   WBC 3.5* 09/11/2015   HGB 10.8* 09/11/2015   HCT 31.9* 09/11/2015   MCV 89.1 09/11/2015   PLT 100* 09/11/2015     Chemistry      Component Value Date/Time   NA 138 07/07/2015 1927   K 3.9 07/07/2015 1927   CL 107 07/07/2015 1927   CO2 23 07/07/2015 1927   BUN 28* 07/07/2015 1927   CREATININE 1.64* 07/07/2015 1927   CREATININE 1.48* 06/02/2015 1006      Component Value Date/Time   CALCIUM 9.4 07/07/2015 1927   ALKPHOS 85 06/02/2015 1006   AST 18 06/02/2015 1006   ALT 10 06/02/2015 1006   BILITOT 0.7 06/02/2015 1006     Impression and Plan:  This is a pleasant 74 year old female with the following issues:  1. Multifactorial anemia: She has an element of anemia of renal disease as well as anemia of possible myelodysplasia. She is currently receiving Aranesp every 3 weeks to keep her hemoglobin above 11 where she functions the best. Her hemoglobin is 10.8 and will receive and injection at this time.  2. Hypertension:  Her blood pressure is better control at this time. 3. History of questionable myelodysplasia: No major changes in her counts and will continue to monitor this. We will consider repeat bone marrow biopsy in the future if she develops any further cytopenias. She does have mild thrombocytopenia which we will continue to monitor future visits. 4. Mild disability: She is unable to walk long distances because of her arthritis and anemia and believe she qualifies for a handicap sticker for parking which we will provide for her today. 5. Follow up. Every 3 weeks for CBC and injection. Visit in about 4 months.        [facility] MD 12/9/201612:51 PM

## 2015-09-11 NOTE — Telephone Encounter (Signed)
Gave and printed appt sched and avs fo rpt for DEC thru April

## 2015-09-11 NOTE — Progress Notes (Signed)
Gave patient signed handicap form.

## 2015-09-22 ENCOUNTER — Other Ambulatory Visit: Payer: Self-pay | Admitting: Internal Medicine

## 2015-10-02 ENCOUNTER — Ambulatory Visit: Payer: Medicare Other

## 2015-10-02 ENCOUNTER — Other Ambulatory Visit (HOSPITAL_BASED_OUTPATIENT_CLINIC_OR_DEPARTMENT_OTHER): Payer: Medicare Other

## 2015-10-02 DIAGNOSIS — D469 Myelodysplastic syndrome, unspecified: Secondary | ICD-10-CM

## 2015-10-02 DIAGNOSIS — N189 Chronic kidney disease, unspecified: Principal | ICD-10-CM

## 2015-10-02 DIAGNOSIS — D631 Anemia in chronic kidney disease: Secondary | ICD-10-CM

## 2015-10-02 LAB — CBC WITH DIFFERENTIAL/PLATELET
BASO%: 0.2 % (ref 0.0–2.0)
Basophils Absolute: 0 10*3/uL (ref 0.0–0.1)
EOS ABS: 0.1 10*3/uL (ref 0.0–0.5)
EOS%: 1.4 % (ref 0.0–7.0)
HCT: 34.9 % (ref 34.8–46.6)
HGB: 11.8 g/dL (ref 11.6–15.9)
LYMPH%: 40.9 % (ref 14.0–49.7)
MCH: 29.4 pg (ref 25.1–34.0)
MCHC: 33.8 g/dL (ref 31.5–36.0)
MCV: 87 fL (ref 79.5–101.0)
MONO#: 0.3 10*3/uL (ref 0.1–0.9)
MONO%: 5.1 % (ref 0.0–14.0)
NEUT%: 52.4 % (ref 38.4–76.8)
NEUTROS ABS: 2.7 10*3/uL (ref 1.5–6.5)
PLATELETS: 144 10*3/uL — AB (ref 145–400)
RBC: 4.01 10*6/uL (ref 3.70–5.45)
RDW: 16.1 % — ABNORMAL HIGH (ref 11.2–14.5)
WBC: 5.1 10*3/uL (ref 3.9–10.3)
lymph#: 2.1 10*3/uL (ref 0.9–3.3)
nRBC: 0 % (ref 0–0)

## 2015-10-02 MED ORDER — DARBEPOETIN ALFA 300 MCG/0.6ML IJ SOSY: 300.0000 ug | PREFILLED_SYRINGE | INTRAMUSCULAR | Status: DC

## 2015-10-22 ENCOUNTER — Other Ambulatory Visit: Payer: Self-pay | Admitting: Oncology

## 2015-10-22 ENCOUNTER — Encounter: Payer: Self-pay | Admitting: *Deleted

## 2015-10-22 DIAGNOSIS — D469 Myelodysplastic syndrome, unspecified: Secondary | ICD-10-CM

## 2015-10-23 ENCOUNTER — Ambulatory Visit (HOSPITAL_BASED_OUTPATIENT_CLINIC_OR_DEPARTMENT_OTHER): Payer: Medicare Other

## 2015-10-23 ENCOUNTER — Other Ambulatory Visit (HOSPITAL_BASED_OUTPATIENT_CLINIC_OR_DEPARTMENT_OTHER): Payer: Medicare Other

## 2015-10-23 VITALS — BP 115/65 | HR 63 | Temp 97.7°F

## 2015-10-23 DIAGNOSIS — N183 Chronic kidney disease, stage 3 unspecified: Secondary | ICD-10-CM

## 2015-10-23 DIAGNOSIS — D631 Anemia in chronic kidney disease: Secondary | ICD-10-CM | POA: Diagnosis not present

## 2015-10-23 DIAGNOSIS — D469 Myelodysplastic syndrome, unspecified: Secondary | ICD-10-CM

## 2015-10-23 LAB — CBC WITH DIFFERENTIAL/PLATELET
BASO%: 0.2 % (ref 0.0–2.0)
Basophils Absolute: 0 10*3/uL (ref 0.0–0.1)
EOS ABS: 0.1 10*3/uL (ref 0.0–0.5)
EOS%: 1.7 % (ref 0.0–7.0)
HEMATOCRIT: 30.5 % — AB (ref 34.8–46.6)
HEMOGLOBIN: 10.4 g/dL — AB (ref 11.6–15.9)
LYMPH%: 39.4 % (ref 14.0–49.7)
MCH: 29.1 pg (ref 25.1–34.0)
MCHC: 34.1 g/dL (ref 31.5–36.0)
MCV: 85.4 fL (ref 79.5–101.0)
MONO#: 0.4 10*3/uL (ref 0.1–0.9)
MONO%: 9.2 % (ref 0.0–14.0)
NEUT%: 49.5 % (ref 38.4–76.8)
NEUTROS ABS: 2.3 10*3/uL (ref 1.5–6.5)
PLATELETS: 149 10*3/uL (ref 145–400)
RBC: 3.57 10*6/uL — ABNORMAL LOW (ref 3.70–5.45)
RDW: 15.1 % — ABNORMAL HIGH (ref 11.2–14.5)
WBC: 4.6 10*3/uL (ref 3.9–10.3)
lymph#: 1.8 10*3/uL (ref 0.9–3.3)

## 2015-10-23 MED ORDER — DARBEPOETIN ALFA 300 MCG/0.6ML IJ SOSY
300.0000 ug | PREFILLED_SYRINGE | INTRAMUSCULAR | Status: DC
  Administered 2015-10-23: 300 ug via SUBCUTANEOUS
  Filled 2015-10-23: qty 0.6

## 2015-10-27 ENCOUNTER — Other Ambulatory Visit: Payer: Self-pay | Admitting: Internal Medicine

## 2015-11-13 ENCOUNTER — Ambulatory Visit (HOSPITAL_BASED_OUTPATIENT_CLINIC_OR_DEPARTMENT_OTHER): Payer: Medicare Other

## 2015-11-13 ENCOUNTER — Other Ambulatory Visit (HOSPITAL_BASED_OUTPATIENT_CLINIC_OR_DEPARTMENT_OTHER): Payer: Medicare Other

## 2015-11-13 DIAGNOSIS — D469 Myelodysplastic syndrome, unspecified: Secondary | ICD-10-CM

## 2015-11-13 LAB — CBC WITH DIFFERENTIAL/PLATELET
BASO%: 0.4 % (ref 0.0–2.0)
Basophils Absolute: 0 10*3/uL (ref 0.0–0.1)
EOS ABS: 0.1 10*3/uL (ref 0.0–0.5)
EOS%: 2.5 % (ref 0.0–7.0)
HEMATOCRIT: 33.8 % — AB (ref 34.8–46.6)
HGB: 11 g/dL — ABNORMAL LOW (ref 11.6–15.9)
LYMPH#: 1.6 10*3/uL (ref 0.9–3.3)
LYMPH%: 43.9 % (ref 14.0–49.7)
MCH: 28.6 pg (ref 25.1–34.0)
MCHC: 32.7 g/dL (ref 31.5–36.0)
MCV: 87.3 fL (ref 79.5–101.0)
MONO#: 0.4 10*3/uL (ref 0.1–0.9)
MONO%: 9.9 % (ref 0.0–14.0)
NEUT#: 1.6 10*3/uL (ref 1.5–6.5)
NEUT%: 43.3 % (ref 38.4–76.8)
PLATELETS: 101 10*3/uL — AB (ref 145–400)
RBC: 3.87 10*6/uL (ref 3.70–5.45)
RDW: 16.6 % — ABNORMAL HIGH (ref 11.2–14.5)
WBC: 3.6 10*3/uL — ABNORMAL LOW (ref 3.9–10.3)

## 2015-11-13 MED ORDER — DARBEPOETIN ALFA 300 MCG/0.6ML IJ SOSY: 300.0000 ug | PREFILLED_SYRINGE | INTRAMUSCULAR | Status: DC

## 2015-11-19 ENCOUNTER — Encounter: Payer: Self-pay | Admitting: Internal Medicine

## 2015-11-19 ENCOUNTER — Ambulatory Visit (INDEPENDENT_AMBULATORY_CARE_PROVIDER_SITE_OTHER): Payer: Medicare Other | Admitting: Internal Medicine

## 2015-11-19 VITALS — BP 128/82 | HR 78 | Temp 98.0°F | Resp 16 | Ht 63.0 in | Wt 172.0 lb

## 2015-11-19 DIAGNOSIS — J069 Acute upper respiratory infection, unspecified: Secondary | ICD-10-CM | POA: Diagnosis not present

## 2015-11-19 MED ORDER — AZELASTINE HCL 0.1 % NA SOLN: 2.0000 | Freq: Two times a day (BID) | NASAL | Status: DC

## 2015-11-19 MED ORDER — CETIRIZINE HCL 10 MG PO CAPS: 10.0000 mg | ORAL_CAPSULE | Freq: Every day | ORAL | Status: DC

## 2015-11-19 MED ORDER — PROMETHAZINE-DM 6.25-15 MG/5ML PO SYRP: ORAL_SOLUTION | ORAL | Status: DC

## 2015-11-19 MED ORDER — AZITHROMYCIN 250 MG PO TABS: ORAL_TABLET | ORAL | Status: DC

## 2015-11-19 MED ORDER — PREDNISONE 20 MG PO TABS: ORAL_TABLET | ORAL | Status: DC

## 2015-11-19 NOTE — Patient Instructions (Signed)
Please take prednisone until it is all the way gone.  Even if you feel better before the medication is gone finish the tablets.  Please use saline spray in your nose as often as you can tolerate.   Please take cetirizine nightly  Before bed to help decrease you congestion.  Please take phenergan syrup up to 3 times per day to help dry up congestion and thin out nasal mucous.   Please use atrovent or ipatropium nasal spray 3 times daily as needed for congestion.  Please use astelin spray 2 times per day once in the mornings and once in the evenings for congestion.  Hold on the zpak for 3-4 days.  If you are getting better do not take zpak.  Only use zpak if worsening cough, fevers, or dark yellow, green, or brown nasal congestion.

## 2015-11-19 NOTE — Progress Notes (Signed)
Patient ID: Susan [name], female   DOB: 08-Nov-1940, 75 y.o.   MRN: [id number] HPI  Patient presents to the office for evaluation of sinus congestion.  It has been going on for 1 weeks.  Patient reports night > day, dry, worse with lying down.  They also endorse change in voice, chills, postnasal drip and nasal congestion, sinus pressure, clear to beige colored nasal sputum.  .  They have tried none.  They report that nothing has worked.  They denies other sick contacts.  Review of Systems  Constitutional: Positive for fever, chills and malaise/fatigue.  HENT: Positive for congestion. Negative for ear pain and sore throat.   Respiratory: Positive for cough and shortness of breath. Negative for sputum production and wheezing.   Cardiovascular: Negative for chest pain, palpitations and leg swelling.  Neurological: Positive for headaches.    PE:  Filed Vitals:   11/19/15 0922  BP: 128/82  Pulse: 78  Temp: 98 F (36.7 C)  Resp: 16    General:  Alert and non-toxic, WDWN, NAD HEENT: NCAT, PERLA, EOM normal, no occular discharge or erythema.  Nasal mucosal edema with sinus tenderness to palpation.  Oropharynx clear with minimal oropharyngeal edema and erythema.  Mucous membranes moist and pink. Neck:  Cervical adenopathy Chest:  RRR no MRGs.  Lungs clear to auscultation A&P with no wheezes rhonchi or rales.   Abdomen: +BS x 4 quadrants, soft, non-tender, no guarding, rigidity, or rebound. Skin: warm and dry no rash Neuro: A&Ox4, CN II-XII grossly intact  Assessment and Plan:   1. Acute URI -nasal saline prn - azithromycin (ZITHROMAX Z-PAK) 250 MG tablet; 2 po day one, then 1 daily x 4 days  Dispense: 6 tablet; Refill: 0 - predniSONE (DELTASONE) 20 MG tablet; 3 tabs po daily x 3 days, then 2 tabs x 3 days, then 1.5 tabs x 3 days, then 1 tab x 3 days, then 0.5 tabs x 3 days  Dispense: 27 tablet; Refill: 0 - azelastine (ASTELIN) 0.1 % nasal spray; Place 2 sprays into both nostrils 2 (two)  times daily. Use in each nostril as directed  Dispense: 30 mL; Refill: 2 - promethazine-dextromethorphan (PROMETHAZINE-DM) 6.25-15 MG/5ML syrup; Take 5 mL PO q8hrs prn for cold symptoms  Dispense: 360 mL; Refill: 1 - Cetirizine HCl 10 MG CAPS; Take 1 capsule (10 mg total) by mouth at bedtime.  Dispense: 90 capsule; Refill: 0

## 2015-12-04 ENCOUNTER — Ambulatory Visit (HOSPITAL_BASED_OUTPATIENT_CLINIC_OR_DEPARTMENT_OTHER): Payer: Medicare Other

## 2015-12-04 ENCOUNTER — Other Ambulatory Visit: Payer: Medicare Other

## 2015-12-04 VITALS — BP 126/72 | HR 66 | Temp 98.2°F

## 2015-12-04 DIAGNOSIS — D469 Myelodysplastic syndrome, unspecified: Secondary | ICD-10-CM | POA: Diagnosis present

## 2015-12-04 DIAGNOSIS — N183 Chronic kidney disease, stage 3 unspecified: Secondary | ICD-10-CM

## 2015-12-04 DIAGNOSIS — D631 Anemia in chronic kidney disease: Secondary | ICD-10-CM

## 2015-12-04 LAB — CBC WITH DIFFERENTIAL/PLATELET
BASO%: 0 % (ref 0.0–2.0)
Basophils Absolute: 0 10e3/uL (ref 0.0–0.1)
EOS%: 0.6 % (ref 0.0–7.0)
Eosinophils Absolute: 0 10e3/uL (ref 0.0–0.5)
HCT: 28.1 % — ABNORMAL LOW (ref 34.8–46.6)
HGB: 9.4 g/dL — ABNORMAL LOW (ref 11.6–15.9)
LYMPH%: 35.1 % (ref 14.0–49.7)
MCH: 28.4 pg (ref 25.1–34.0)
MCHC: 33.5 g/dL (ref 31.5–36.0)
MCV: 84.9 fL (ref 79.5–101.0)
MONO#: 0.8 10e3/uL (ref 0.1–0.9)
MONO%: 12.3 % (ref 0.0–14.0)
NEUT#: 3.3 10e3/uL (ref 1.5–6.5)
NEUT%: 52 % (ref 38.4–76.8)
Platelets: 167 10e3/uL (ref 145–400)
RBC: 3.31 10e6/uL — ABNORMAL LOW (ref 3.70–5.45)
RDW: 15.1 % — ABNORMAL HIGH (ref 11.2–14.5)
WBC: 6.4 10e3/uL (ref 3.9–10.3)
lymph#: 2.2 10e3/uL (ref 0.9–3.3)

## 2015-12-04 MED ORDER — DARBEPOETIN ALFA 300 MCG/0.6ML IJ SOSY
300.0000 ug | PREFILLED_SYRINGE | INTRAMUSCULAR | Status: DC
  Administered 2015-12-04: 300 ug via SUBCUTANEOUS
  Filled 2015-12-04: qty 0.6

## 2015-12-14 ENCOUNTER — Encounter: Payer: Self-pay | Admitting: Physician Assistant

## 2015-12-14 ENCOUNTER — Ambulatory Visit (INDEPENDENT_AMBULATORY_CARE_PROVIDER_SITE_OTHER): Payer: Medicare Other | Admitting: Physician Assistant

## 2015-12-14 VITALS — BP 124/72 | HR 79 | Temp 97.9°F | Resp 16 | Ht 63.0 in | Wt 171.0 lb

## 2015-12-14 DIAGNOSIS — Z79899 Other long term (current) drug therapy: Secondary | ICD-10-CM

## 2015-12-14 DIAGNOSIS — Z0001 Encounter for general adult medical examination with abnormal findings: Secondary | ICD-10-CM | POA: Diagnosis not present

## 2015-12-14 DIAGNOSIS — R6889 Other general symptoms and signs: Secondary | ICD-10-CM

## 2015-12-14 DIAGNOSIS — N183 Chronic kidney disease, stage 3 unspecified: Secondary | ICD-10-CM

## 2015-12-14 DIAGNOSIS — I10 Essential (primary) hypertension: Secondary | ICD-10-CM | POA: Diagnosis not present

## 2015-12-14 DIAGNOSIS — K219 Gastro-esophageal reflux disease without esophagitis: Secondary | ICD-10-CM | POA: Diagnosis not present

## 2015-12-14 DIAGNOSIS — Z6830 Body mass index (BMI) 30.0-30.9, adult: Secondary | ICD-10-CM | POA: Diagnosis not present

## 2015-12-14 DIAGNOSIS — E039 Hypothyroidism, unspecified: Secondary | ICD-10-CM

## 2015-12-14 DIAGNOSIS — E559 Vitamin D deficiency, unspecified: Secondary | ICD-10-CM

## 2015-12-14 DIAGNOSIS — R7309 Other abnormal glucose: Secondary | ICD-10-CM | POA: Diagnosis not present

## 2015-12-14 DIAGNOSIS — D469 Myelodysplastic syndrome, unspecified: Secondary | ICD-10-CM

## 2015-12-14 DIAGNOSIS — Z0000 Encounter for general adult medical examination without abnormal findings: Secondary | ICD-10-CM

## 2015-12-14 DIAGNOSIS — E782 Mixed hyperlipidemia: Secondary | ICD-10-CM

## 2015-12-14 LAB — CBC WITH DIFFERENTIAL/PLATELET
Basophils Absolute: 0 10*3/uL (ref 0.0–0.1)
Basophils Relative: 0 % (ref 0–1)
Eosinophils Absolute: 0.1 10*3/uL (ref 0.0–0.7)
Eosinophils Relative: 2 % (ref 0–5)
HEMATOCRIT: 30 % — AB (ref 36.0–46.0)
HEMOGLOBIN: 9.6 g/dL — AB (ref 12.0–15.0)
LYMPHS ABS: 1.6 10*3/uL (ref 0.7–4.0)
LYMPHS PCT: 46 % (ref 12–46)
MCH: 28.2 pg (ref 26.0–34.0)
MCHC: 32 g/dL (ref 30.0–36.0)
MCV: 88.2 fL (ref 78.0–100.0)
MONO ABS: 0.4 10*3/uL (ref 0.1–1.0)
MONOS PCT: 11 % (ref 3–12)
MPV: 10.4 fL (ref 8.6–12.4)
Neutro Abs: 1.4 10*3/uL — ABNORMAL LOW (ref 1.7–7.7)
Neutrophils Relative %: 41 % — ABNORMAL LOW (ref 43–77)
Platelets: 156 10*3/uL (ref 150–400)
RBC: 3.4 MIL/uL — AB (ref 3.87–5.11)
RDW: 16.3 % — ABNORMAL HIGH (ref 11.5–15.5)
WBC: 3.4 10*3/uL — ABNORMAL LOW (ref 4.0–10.5)

## 2015-12-14 LAB — HEPATIC FUNCTION PANEL
ALT: 9 U/L (ref 6–29)
AST: 15 U/L (ref 10–35)
Albumin: 3.9 g/dL (ref 3.6–5.1)
Alkaline Phosphatase: 82 U/L (ref 33–130)
BILIRUBIN DIRECT: 0.2 mg/dL (ref <=0.2)
Indirect Bilirubin: 0.6 mg/dL (ref 0.2–1.2)
Total Bilirubin: 0.8 mg/dL (ref 0.2–1.2)
Total Protein: 6.6 g/dL (ref 6.1–8.1)

## 2015-12-14 LAB — LIPID PANEL
CHOL/HDL RATIO: 3.2 ratio (ref <=5.0)
Cholesterol: 207 mg/dL — ABNORMAL HIGH (ref 125–200)
HDL: 65 mg/dL (ref >=46)
LDL Cholesterol: 119 mg/dL (ref <130)
Triglycerides: 113 mg/dL (ref <150)
VLDL: 23 mg/dL (ref <30)

## 2015-12-14 LAB — TSH: TSH: 2.03 m[IU]/L

## 2015-12-14 LAB — MAGNESIUM: MAGNESIUM: 1 mg/dL — AB (ref 1.5–2.5)

## 2015-12-14 NOTE — Patient Instructions (Addendum)
[facility]  7 a.m.-6:30 p.m., Monday 7 a.m.-5 p.m., Tuesday-Friday Schedule an appointment by calling [phone].    GET ON   Zyrtec or certizine at night because it can make you sleepy  Your ears and sinuses are connected by the eustachian tube. When your sinuses are inflamed, this can close off the tube and cause fluid to collect in your middle ear. This can then cause dizziness, popping, clicking, ringing, and echoing in your ears. This is often NOT an infection and does NOT require antibiotics, it is caused by inflammation so the treatments help the inflammation. This can take a long time to get better so please be patient.  Here are things you can do to help with this: - Try the Flonase or Nasonex. Remember to spray each nostril twice towards the outer part of your eye.  Do not sniff but instead pinch your nose and tilt your head back to help the medicine get into your sinuses.  The best time to do this is at bedtime.Stop if you get blurred vision or nose bleeds.  -While drinking fluids, pinch and hold nose close and swallow, to help open eustachian tubes to drain fluid behind ear drums.  -can use decongestant over the counter, please do not use if you have high blood pressure or certain heart conditions.   if worsening HA, changes vision/speech, imbalance, weakness go to the ER  LOOK UP MEET UP ONLINE

## 2015-12-14 NOTE — Progress Notes (Signed)
MEDICARE ANNUAL WELLNESS VISIT AND FOLLOW UP  Assessment:   1. Essential hypertension - CBC with Differential/Platelet - BASIC METABOLIC PANEL WITH GFR - Hepatic function panel  2. Hypothyroidism, unspecified hypothyroidism type - TSH  3. CKD, 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) - BASIC METABOLIC PANEL WITH GFR  4. Hyperlipidemia - Lipid panel  5. Abnormal glucose Continue weight loss  6. Vitamin D deficiency  7. Medication management - Magnesium  8. 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HCC) - CBC with Differential/Platelet  9. Gastroesophageal reflux disease, esophagitis presence not specified Continue H2/Diet  10. Encounter for Medicare annual wellness exam Get MGM  11. BMI 30.0-30.9,adult   Over 30 minutes of exam, counseling, chart review, and critical decision making was performed  Plan:   During the course of the visit the patient was educated and counseled about appropriate screening and preventive services including:    Pneumococcal vaccine   Influenza vaccine  Td vaccine  Prevnar 13  Screening electrocardiogram  Screening mammography  Bone densitometry screening  Colorectal cancer screening  Diabetes screening  Glaucoma screening  Nutrition counseling   Advanced directives: given info/requested copies  Conditions/risks identified: Urinary Incontinence is not an issue: discussed non pharmacology and pharmacology options.  Fall risk: low- discussed PT, home fall assessment, medications.    Subjective:   Susan [name] is a 75 y.o. AA female who presents for Medicare Annual Wellness Visit and 3 month follow up on hypertension, hyperlipidemia, MDS, and vitamin D def.  Date of last medicare wellness visit was 11/2014.  Her blood pressure has been controlled at home, today their BP is BP: 124/72 mmHg She does workout. She denies chest pain, shortness of breath, dizziness.  She follows with Dr. Luisa Dago and gets periodic aranesp injection for anemia due  to MDS and CKD from HTN.  She is on cholesterol medication, she is on pravastatin 40mg  1/2 daily and denies myalgias. Her cholesterol is at goal. The cholesterol last visit was:   Lab Results  Component Value Date   CHOL 155 06/02/2015   HDL 56 06/02/2015   LDLCALC 83 06/02/2015   TRIG 82 06/02/2015   CHOLHDL 2.8 06/02/2015    Last A1C in the office was:  Lab Results  Component Value Date   HGBA1C 4.7 02/24/2015   Lab Results  Component Value Date   GFRAA 34* 07/07/2015   Patient is on Vitamin D supplement. Lab Results  Component Value Date   VD25OH 72 02/24/2015     She is on thyroid medication. Her medication was not changed last visit.   Lab Results  Component Value Date   TSH 2.881 06/02/2015  .  BMI is Body mass index is 30.3 kg/(m^2)., she is working on diet and exercise. Wt Readings from Last 3 Encounters:  12/14/15 171 lb (77.565 kg)  11/19/15 172 lb (78.019 kg)  09/11/15 170 lb 11.2 oz (77.429 kg)    Medication Review Current Outpatient Prescriptions on File Prior to Visit  Medication Sig Dispense Refill  . aspirin 81 MG chewable tablet Chew 81 mg by mouth every morning.     [name] azelastine (ASTELIN) 0.1 % nasal spray Place 2 sprays into both nostrils 2 (two) times daily. Use in each nostril as directed 30 mL 2  . Cetirizine HCl 10 MG CAPS Take 1 capsule (10 mg total) by mouth at bedtime. 90 capsule 0  . Cholecalciferol (VITAMIN D-3) 5000 UNITS TABS Take 10,000 Units by mouth daily.     . Darbepoetin Alfa-Albumin (ARANESP IJ)  Inject as directed every 21 ( twenty-one) days.     . diclofenac (VOLTAREN) 75 MG EC tablet TAKE 1 TABLET(75 MG) BY MOUTH TWICE DAILY 60 tablet 0  . HYDROcodone-acetaminophen (NORCO) 5-325 MG tablet Take 1 tablet by mouth every 6 (six) hours as needed (Cough). Max: 4 tablets a day 10 tablet 0  . ipratropium (ATROVENT) 0.03 % nasal spray Take 1 to 2 sprays into each nostril up to 2 or 3 x day as needed (Patient taking differently: Place 1 spray  into both nostrils daily as needed for rhinitis. Take 1 to 2 sprays into each nostril up to 2 or 3 x day as needed) 30 mL 99  . labetalol (NORMODYNE) 300 MG tablet Take 1 tablet (300 mg total) by mouth 2 (two) times daily. 180 tablet 99  . levothyroxine (SYNTHROID) 100 MCG tablet Take 1 tablet daily on an empty stomach for 30 minutes 90 tablet 1  . pantoprazole (PROTONIX) 40 MG tablet TAKE ONE TABLET BY MOUTH ONCE DAILY FOR  ACID  REFLUX 90 tablet 1  . pravastatin (PRAVACHOL) 10 MG tablet Take 5 mg by mouth at bedtime.     [name] amLODipine (NORVASC) 10 MG tablet Take 1/2 to 1 tablet daily for BP (Patient taking differently: Take 5 mg by mouth at bedtime. Take 1/2 to 1 tablet daily for BP) 30 tablet 5   No current facility-administered medications on file prior to visit.    Current Problems (verified) Patient Active Problem List   Diagnosis Date Noted  . BMI 30.0-30.9,adult 09/02/2015  . Encounter for Medicare annual wellness exam 06/02/2015  . 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[place]) 11/03/2014  . CKD, 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) 11/02/2014  . Prediabetes 01/27/2014  . Medication management 01/27/2014  . Hyperlipidemia 08/27/2013  . Hypertension   . Hypothyroidism   . GERD (gastroesophageal reflux disease)   . Vitamin D deficiency   . Internal hemorrhoids with other complication [id number]    Screening Tests Immunization History  Administered Date(s) Administered  . Td 10/03/2005    Preventative care: Last colonoscopy: 2008 due next year Last mammogram: March 2015 DUE Last pap smear/pelvic exam: remote  DEXA: 2013 per patient  Prior vaccinations: TD or Tdap: 2007, declines today  Influenza: declines Pneumococcal: declines Prevnar13: declines Shingles/Zostavax: declines  Names of Other Physician/Practitioners you currently use: 1. [facility]- here for primary care 2. Dr. Herbert Deaner, eye doctor, last visit Sept 2016, wears glasses 3. None,  dentist, last visit 2016 Patient Care Team: Unk Pinto, MD as PCP - General (Internal Medicine) Wyatt Portela, MD (Hematology and Oncology) Inda Castle, MD as Consulting Physician (Gastroenterology) Malena Catholic, MD as Referring Physician Monna Fam, MD as Consulting Physician (Ophthalmology)  Past Surgical History  Procedure Laterality Date  . Umbilical hernia repair    . Rotator cuff repair Left   . Inguinal hernia repair    . Carpal tunnel release Right   . Dilation and curettage of uterus     Family History  Problem Relation Age of Onset  . Stroke Father   . Hypertension Father   . Stroke Sister   . Coronary artery disease Brother   . Hypertension Sister   . Bladder Cancer Sister   . Cancer Mother    Social History  Substance Use Topics  . Smoking status: Former Smoker    Types: Cigarettes    Quit date: 10/02/1996  . Smokeless tobacco: Never Used  . Alcohol Use: 0.0 oz/week  Comment: glass of wine at least once a month.    MEDICARE WELLNESS OBJECTIVES: Tobacco use: She does not smoke.  Patient is a former smoker. If yes, counseling given Alcohol Current alcohol use: social drinker Osteoporosis: postmenopausal estrogen deficiency and dietary calcium and/or vitamin D deficiency, History of fracture in the past year: no Diet: in general, a "healthy" diet   Physical activity: Current Exercise Habits: Home exercise routine, Type of exercise: walking, Time (Minutes): 20, Frequency (Times/Week): 3, Weekly Exercise (Minutes/Week): 60, Intensity: Mild Cardiac risk factors: Cardiac Risk Factors include: advanced age (>43men, >20 women);dyslipidemia;hypertension;sedentary lifestyle;obesity (BMI >30kg/m2) Depression/mood screen:   Depression screen [facility] 2/9 12/14/2015  Decreased Interest 0  Down, Depressed, Hopeless 0  PHQ - 2 Score 0    ADLs:  In your present state of health, do you have any difficulty performing the following activities: 12/14/2015  09/02/2015  Hearing? N N  Vision? N N  Difficulty concentrating or making decisions? N N  Walking or climbing stairs? N N  Dressing or bathing? N N  Doing errands, shopping? N N  Preparing Food and eating ? N -  Using the Toilet? N -  In the past six months, have you accidently leaked urine? N -  Do you have problems with loss of bowel control? N -  Managing your Medications? N -  Managing your Finances? N -  Housekeeping or managing your Housekeeping? N -     Cognitive Testing  Alert? Yes  Normal Appearance?Yes  Oriented to person? Yes  Place? Yes   Time? Yes  Recall of three objects?  Yes  Can perform simple calculations? Yes  Displays appropriate judgment?Yes  Can read the correct time from a watch face?Yes  EOL planning: Does patient have an advance directive?: No Would patient like information on creating an advanced directive?: Yes - Educational materials given   Objective:   Today's Vitals   12/14/15 0848  BP: 124/72  Pulse: 79  Temp: 97.9 F (36.6 C)  Resp: 16  Height: 5\' 3"  (1.6 m)  Weight: 171 lb (77.565 kg)  SpO2: 97%  PainSc: 0-No pain   Body mass index is 30.3 kg/(m^2).  General appearance: alert, no distress, WD/WN,  female HEENT: normocephalic, sclerae anicteric, TMs pearly, nares patent, no discharge or erythema, pharynx normal Oral cavity: MMM, no lesions Neck: supple, no lymphadenopathy, no thyromegaly, no masses Heart: RRR, normal S1, S2, no murmurs Lungs: CTA bilaterally, no wheezes, rhonchi, or rales Abdomen: +bs, soft, non tender, non distended, no masses, no hepatomegaly, no splenomegaly Musculoskeletal: nontender, no swelling, no obvious deformity Extremities: no edema, no cyanosis, no clubbing Pulses: 2+ symmetric, upper and lower extremities, normal cap refill Neurological: alert, oriented x 3, CN2-12 intact, strength normal upper extremities and lower extremities, sensation normal throughout, DTRs 2+ throughout, no cerebellar signs,  gait normal Psychiatric: normal affect, behavior normal, pleasant  Breast: defer Gyn: defer Rectal: defer   Medicare Attestation I have personally reviewed: The patient's medical and social history Their use of alcohol, tobacco or illicit drugs Their current medications and supplements The patient's functional ability including ADLs,fall risks, home safety risks, cognitive, and hearing and visual impairment Diet and physical activities Evidence for depression or mood disorders  The patient's weight, height, BMI, and visual acuity have been recorded in the chart.  I have made referrals, counseling, and provided education to the patient based on review of the above and I have provided the patient with a written personalized care plan for preventive services.  Vicie Mutters, PA-C   12/14/2015

## 2015-12-16 LAB — BASIC METABOLIC PANEL WITHOUT GFR
BUN: 19 mg/dL (ref 7–25)
CO2: 24 mmol/L (ref 20–31)
Calcium: 9 mg/dL (ref 8.6–10.4)
Chloride: 108 mmol/L (ref 98–110)
Creat: 1.55 mg/dL — ABNORMAL HIGH (ref 0.60–0.93)
GFR, Est African American: 37 mL/min — ABNORMAL LOW
GFR, Est Non African American: 32 mL/min — ABNORMAL LOW
Glucose, Bld: 81 mg/dL (ref 65–99)
Potassium: 3.6 mmol/L (ref 3.5–5.3)
Sodium: 142 mmol/L (ref 135–146)

## 2015-12-19 ENCOUNTER — Other Ambulatory Visit: Payer: Self-pay | Admitting: Internal Medicine

## 2015-12-25 ENCOUNTER — Ambulatory Visit (HOSPITAL_BASED_OUTPATIENT_CLINIC_OR_DEPARTMENT_OTHER): Payer: Medicare Other

## 2015-12-25 ENCOUNTER — Other Ambulatory Visit (HOSPITAL_BASED_OUTPATIENT_CLINIC_OR_DEPARTMENT_OTHER): Payer: Medicare Other

## 2015-12-25 VITALS — BP 155/78 | HR 59 | Temp 98.4°F

## 2015-12-25 DIAGNOSIS — D469 Myelodysplastic syndrome, unspecified: Secondary | ICD-10-CM | POA: Diagnosis not present

## 2015-12-25 LAB — CBC WITH DIFFERENTIAL/PLATELET
BASO%: 0.3 % (ref 0.0–2.0)
Basophils Absolute: 0 10*3/uL (ref 0.0–0.1)
EOS ABS: 0.1 10*3/uL (ref 0.0–0.5)
EOS%: 2.5 % (ref 0.0–7.0)
HCT: 29.8 % — ABNORMAL LOW (ref 34.8–46.6)
HEMOGLOBIN: 10.1 g/dL — AB (ref 11.6–15.9)
LYMPH%: 46.7 % (ref 14.0–49.7)
MCH: 29.4 pg (ref 25.1–34.0)
MCHC: 33.9 g/dL (ref 31.5–36.0)
MCV: 86.9 fL (ref 79.5–101.0)
MONO#: 0.3 10*3/uL (ref 0.1–0.9)
MONO%: 9.1 % (ref 0.0–14.0)
NEUT%: 41.4 % (ref 38.4–76.8)
NEUTROS ABS: 1.3 10*3/uL — AB (ref 1.5–6.5)
PLATELETS: 119 10*3/uL — AB (ref 145–400)
RBC: 3.43 10*6/uL — AB (ref 3.70–5.45)
RDW: 15.6 % — AB (ref 11.2–14.5)
WBC: 3.2 10*3/uL — AB (ref 3.9–10.3)
lymph#: 1.5 10*3/uL (ref 0.9–3.3)

## 2015-12-25 MED ORDER — DARBEPOETIN ALFA 300 MCG/0.6ML IJ SOSY
300.0000 ug | PREFILLED_SYRINGE | INTRAMUSCULAR | Status: DC
  Administered 2015-12-25: 300 ug via SUBCUTANEOUS
  Filled 2015-12-25: qty 0.6

## 2016-01-04 ENCOUNTER — Encounter (HOSPITAL_COMMUNITY): Payer: Self-pay

## 2016-01-04 ENCOUNTER — Emergency Department (HOSPITAL_COMMUNITY): Payer: Medicare Other

## 2016-01-04 ENCOUNTER — Emergency Department (HOSPITAL_COMMUNITY)
Admission: EM | Admit: 2016-01-04 | Discharge: 2016-01-05 | Disposition: A | Discharge status: Home / Self Care | Payer: Medicare Other | Attending: Emergency Medicine | Admitting: Emergency Medicine

## 2016-01-04 DIAGNOSIS — Z9889 Other specified postprocedural states: Secondary | ICD-10-CM | POA: Insufficient documentation

## 2016-01-04 DIAGNOSIS — K429 Umbilical hernia without obstruction or gangrene: Secondary | ICD-10-CM | POA: Diagnosis not present

## 2016-01-04 DIAGNOSIS — R1032 Left lower quadrant pain: Secondary | ICD-10-CM | POA: Diagnosis not present

## 2016-01-04 DIAGNOSIS — Z862 Personal history of diseases of the blood and blood-forming organs and certain disorders involving the immune mechanism: Secondary | ICD-10-CM | POA: Diagnosis not present

## 2016-01-04 DIAGNOSIS — Z791 Long term (current) use of non-steroidal anti-inflammatories (NSAID): Secondary | ICD-10-CM | POA: Insufficient documentation

## 2016-01-04 DIAGNOSIS — Z87891 Personal history of nicotine dependence: Secondary | ICD-10-CM | POA: Insufficient documentation

## 2016-01-04 DIAGNOSIS — M1990 Unspecified osteoarthritis, unspecified site: Secondary | ICD-10-CM | POA: Diagnosis not present

## 2016-01-04 DIAGNOSIS — K219 Gastro-esophageal reflux disease without esophagitis: Secondary | ICD-10-CM | POA: Diagnosis not present

## 2016-01-04 DIAGNOSIS — Z79899 Other long term (current) drug therapy: Secondary | ICD-10-CM | POA: Diagnosis not present

## 2016-01-04 DIAGNOSIS — E559 Vitamin D deficiency, unspecified: Secondary | ICD-10-CM | POA: Insufficient documentation

## 2016-01-04 DIAGNOSIS — Q069 Congenital malformation of spinal cord, unspecified: Secondary | ICD-10-CM | POA: Diagnosis not present

## 2016-01-04 DIAGNOSIS — I10 Essential (primary) hypertension: Secondary | ICD-10-CM | POA: Insufficient documentation

## 2016-01-04 DIAGNOSIS — Z7982 Long term (current) use of aspirin: Secondary | ICD-10-CM | POA: Diagnosis not present

## 2016-01-04 DIAGNOSIS — E039 Hypothyroidism, unspecified: Secondary | ICD-10-CM | POA: Insufficient documentation

## 2016-01-04 DIAGNOSIS — K529 Noninfective gastroenteritis and colitis, unspecified: Secondary | ICD-10-CM | POA: Insufficient documentation

## 2016-01-04 DIAGNOSIS — Z880 Allergy status to penicillin: Secondary | ICD-10-CM | POA: Diagnosis not present

## 2016-01-04 DIAGNOSIS — R112 Nausea with vomiting, unspecified: Secondary | ICD-10-CM | POA: Diagnosis present

## 2016-01-04 DIAGNOSIS — K5730 Diverticulosis of large intestine without perforation or abscess without bleeding: Secondary | ICD-10-CM | POA: Diagnosis not present

## 2016-01-04 LAB — CBC
HCT: 36.9 % (ref 36.0–46.0)
Hemoglobin: 12.8 g/dL (ref 12.0–15.0)
MCH: 29.4 pg (ref 26.0–34.0)
MCHC: 34.7 g/dL (ref 30.0–36.0)
MCV: 84.6 fL (ref 78.0–100.0)
PLATELETS: 228 10*3/uL (ref 150–400)
RBC: 4.36 MIL/uL (ref 3.87–5.11)
RDW: 15.9 % — AB (ref 11.5–15.5)
WBC: 5.4 10*3/uL (ref 4.0–10.5)

## 2016-01-04 LAB — URINALYSIS, ROUTINE W REFLEX MICROSCOPIC
BILIRUBIN URINE: NEGATIVE
GLUCOSE, UA: NEGATIVE mg/dL
Hgb urine dipstick: NEGATIVE
KETONES UR: NEGATIVE mg/dL
LEUKOCYTES UA: NEGATIVE
NITRITE: NEGATIVE
Protein, ur: NEGATIVE mg/dL
Specific Gravity, Urine: 1.016 (ref 1.005–1.030)
pH: 5 (ref 5.0–8.0)

## 2016-01-04 LAB — COMPREHENSIVE METABOLIC PANEL
ALK PHOS: 102 U/L (ref 38–126)
ALT: 12 U/L — AB (ref 14–54)
AST: 21 U/L (ref 15–41)
Albumin: 4.6 g/dL (ref 3.5–5.0)
Anion gap: 12 (ref 5–15)
BILIRUBIN TOTAL: 1 mg/dL (ref 0.3–1.2)
BUN: 25 mg/dL — AB (ref 6–20)
CALCIUM: 10.3 mg/dL (ref 8.9–10.3)
CO2: 24 mmol/L (ref 22–32)
CREATININE: 1.64 mg/dL — AB (ref 0.44–1.00)
Chloride: 104 mmol/L (ref 101–111)
GFR calc Af Amer: 34 mL/min — ABNORMAL LOW (ref >60)
GFR, EST NON AFRICAN AMERICAN: 29 mL/min — AB (ref >60)
Glucose, Bld: 145 mg/dL — ABNORMAL HIGH (ref 65–99)
Potassium: 4.1 mmol/L (ref 3.5–5.1)
Sodium: 140 mmol/L (ref 135–145)
TOTAL PROTEIN: 7.9 g/dL (ref 6.5–8.1)

## 2016-01-04 LAB — LIPASE, BLOOD: Lipase: 37 U/L (ref 11–51)

## 2016-01-04 MED ORDER — MORPHINE SULFATE (PF) 4 MG/ML IV SOLN: 4.0000 mg | Freq: Once | INTRAVENOUS | Status: DC

## 2016-01-04 MED ORDER — LOPERAMIDE HCL 2 MG PO CAPS
2.0000 mg | ORAL_CAPSULE | ORAL | Status: DC | PRN
  Administered 2016-01-04: 2 mg via ORAL
  Filled 2016-01-04: qty 1

## 2016-01-04 MED ORDER — SODIUM CHLORIDE 0.9 % IV SOLN
INTRAVENOUS | Status: DC
  Administered 2016-01-04: via INTRAVENOUS

## 2016-01-04 MED ORDER — IOHEXOL 300 MG/ML  SOLN
25.0000 mL | Freq: Once | INTRAMUSCULAR | Status: AC | PRN
  Administered 2016-01-04: 25 mL via ORAL

## 2016-01-04 MED ORDER — ONDANSETRON HCL 4 MG/2ML IJ SOLN
4.0000 mg | Freq: Once | INTRAMUSCULAR | Status: AC
  Administered 2016-01-04: 4 mg via INTRAVENOUS
  Filled 2016-01-04: qty 2

## 2016-01-04 MED ORDER — MORPHINE SULFATE (PF) 2 MG/ML IV SOLN
2.0000 mg | Freq: Once | INTRAVENOUS | Status: AC
  Administered 2016-01-04: 2 mg via INTRAVENOUS
  Filled 2016-01-04: qty 1

## 2016-01-04 NOTE — ED Notes (Signed)
Bed: [id number] Expected date:  Expected time:  Means of arrival:  Comments: 75 yo  F  N/V/D

## 2016-01-04 NOTE — ED Provider Notes (Signed)
CSN: [id number]     Arrival date & time 01/04/16  2204 History   First MD Initiated Contact with Patient 01/04/16 2307     Chief Complaint  Patient presents with  . Nausea  . Emesis  . Diarrhea     (Consider location/radiation/quality/duration/timing/severity/associated sxs/prior Treatment) HPI Comments: Pt here with 1 day of watery emesis and llq abd pain No blood in stool, denies sick exposure llq pain is dull and persistent and not a/w urinary sx Some spitting up but no green or yellow emesis Has used tylenol for pain and anti-diarrheal meds w/ some relief Denies any new meds or bad food  Patient is a 75 y.o. female presenting with vomiting and diarrhea. The history is provided by the patient.  Emesis Associated symptoms: diarrhea   Diarrhea Associated symptoms: vomiting     Past Medical History  Diagnosis Date  . Anemia   . Arthritis   . Myelodysplasia   . Hypertension   . HLD (hyperlipidemia)   . Hypothyroidism   . GERD (gastroesophageal reflux disease)   . Vitamin D deficiency    Past Surgical History  Procedure Laterality Date  . Umbilical hernia repair    . Rotator cuff repair Left   . Inguinal hernia repair    . Carpal tunnel release Right   . Dilation and curettage of uterus     Family History  Problem Relation Age of Onset  . Stroke Father   . Hypertension Father   . Stroke Sister   . Coronary artery disease Brother   . Hypertension Sister   . Bladder Cancer Sister   . Cancer Mother    Social History  Substance Use Topics  . Smoking status: Former Smoker    Types: Cigarettes    Quit date: 10/02/1996  . Smokeless tobacco: Never Used  . Alcohol Use: 0.0 oz/week     Comment: glass of wine at least once a month.   OB History    No data available     Review of Systems  Gastrointestinal: Positive for vomiting and diarrhea.  All other systems reviewed and are negative.     Allergies  Codeine; Penicillins; Bactrim; Clarithromycin; Flagyl;  and Sulfa antibiotics  Home Medications   Prior to Admission medications   Medication Sig Start Date End Date Taking? Authorizing Provider  amLODipine (NORVASC) 10 MG tablet Take 1/2 to 1 tablet daily for BP Patient taking differently: Take 5 mg by mouth at bedtime. Take 1/2 to 1 tablet daily for BP 03/09/15 01/04/16 Yes Unk Pinto, MD  aspirin 81 MG chewable tablet Chew 81 mg by mouth every morning.    Yes Historical Provider, MD  Cetirizine HCl 10 MG CAPS Take 1 capsule (10 mg total) by mouth at bedtime. Patient taking differently: Take 10 mg by mouth at bedtime as needed (allergies).  11/19/15  Yes Courtney Forcucci, PA-C  Cholecalciferol (VITAMIN D-3) 5000 UNITS TABS Take 10,000 Units by mouth daily.    Yes Historical Provider, MD  Darbepoetin Alfa-Albumin (ARANESP IJ) Inject as directed every 21 ( twenty-one) days.    Yes Historical Provider, MD  diclofenac (VOLTAREN) 75 MG EC tablet TAKE 1 TABLET(75 MG) BY MOUTH TWICE DAILY 12/20/15  Yes Vicie Mutters, PA-C  HYDROcodone-acetaminophen (NORCO) 5-325 MG tablet Take 1 tablet by mouth every 6 (six) hours as needed (Cough). Max: 4 tablets a day 08/17/15  Yes Vicie Mutters, PA-C  ipratropium (ATROVENT) 0.03 % nasal spray Take 1 to 2 sprays into each nostril up to  2 or 3 x day as needed Patient taking differently: Place 1 spray into both nostrils daily as needed for rhinitis. Take 1 to 2 sprays into each nostril up to 2 or 3 x day as needed 01/06/15  Yes Unk Pinto, MD  labetalol (NORMODYNE) 300 MG tablet Take 1 tablet (300 mg total) by mouth 2 (two) times daily. 06/01/15  Yes Unk Pinto, MD  levothyroxine (SYNTHROID) 100 MCG tablet Take 1 tablet daily on an empty stomach for 30 minutes Patient taking differently: Take 100 mcg by mouth daily before breakfast. Take an empty stomach for 30 minutes 08/31/15  Yes Unk Pinto, MD  losartan-hydrochlorothiazide (HYZAAR) 100-25 MG tablet Take 1 tablet by mouth daily. 11/24/15  Yes Historical  Provider, MD  pantoprazole (PROTONIX) 40 MG tablet TAKE 1 TABLET BY MOUTH EVERY DAY FOR ACID REFLUX Patient taking differently: TAKE40 MG BY MOUTH EVERY DAY FOR ACID REFLUX 12/20/15  Yes Vicie Mutters, PA-C  azelastine (ASTELIN) 0.1 % nasal spray Place 2 sprays into both nostrils 2 (two) times daily. Use in each nostril as directed Patient not taking: Reported on 01/04/2016 11/19/15 11/18/16  Loma Sousa Forcucci, PA-C   BP 137/84 mmHg  Pulse 85  Temp(Src) 98.1 F (36.7 C) (Oral)  Resp 17  SpO2 96% Physical Exam  Constitutional: She is oriented to person, place, and time. She appears well-developed and well-nourished.  Non-toxic appearance. No distress.  HENT:  Head: Normocephalic and atraumatic.  Eyes: Conjunctivae, EOM and lids are normal. Pupils are equal, round, and reactive to light.  Neck: Normal range of motion. Neck supple. No tracheal deviation present. No thyroid mass present.  Cardiovascular: Normal rate, regular rhythm and normal heart sounds.  Exam reveals no gallop.   No murmur heard. Pulmonary/Chest: Effort normal and breath sounds normal. No stridor. No respiratory distress. She has no decreased breath sounds. She has no wheezes. She has no rhonchi. She has no rales.  Abdominal: Soft. Normal appearance and bowel sounds are normal. She exhibits no distension. There is tenderness in the left lower quadrant. There is no rigidity, no rebound, no guarding and no CVA tenderness.    Musculoskeletal: Normal range of motion. She exhibits no edema or tenderness.  Neurological: She is alert and oriented to person, place, and time. She has normal strength. No cranial nerve deficit or sensory deficit. GCS eye subscore is 4. GCS verbal subscore is 5. GCS motor subscore is 6.  Skin: Skin is warm and dry. No abrasion and no rash noted.  Psychiatric: She has a normal mood and affect. Her speech is normal and behavior is normal.  Nursing note and vitals reviewed.   ED Course  Procedures  (including critical care time) Labs Review Labs Reviewed  COMPREHENSIVE METABOLIC PANEL - Abnormal; Notable for the following:    Glucose, Bld 145 (*)    BUN 25 (*)    Creatinine, Ser 1.64 (*)    ALT 12 (*)    GFR calc non Af Amer 29 (*)    GFR calc Af Amer 34 (*)    All other components within normal limits  CBC - Abnormal; Notable for the following:    RDW 15.9 (*)    All other components within normal limits  URINALYSIS, ROUTINE W REFLEX MICROSCOPIC (NOT AT [facility]) - Abnormal; Notable for the following:    APPearance CLOUDY (*)    All other components within normal limits  LIPASE, BLOOD    Imaging Review No results found. I have personally reviewed and evaluated  these images and lab results as part of my medical decision-making.   EKG Interpretation None      MDM   Final diagnoses:  None    Patient given pain meds as well as medications for diarrhea and nausea feels better. Diarrhea has slowed down. She has no recent antibiotic use. Suspect viral illness and stable for discharge    Lacretia Leigh, MD 01/05/16 [phone]

## 2016-01-04 NOTE — ED Notes (Signed)
Patient from home.  PTAR states patient has had N/V/D since 9am this morning.  Patient states that wanted a ride to the hospital and called EMS.Hx of HTN.  VS en route BP 140/78, PR 90, O2 98% 18 RR.

## 2016-01-05 ENCOUNTER — Encounter (HOSPITAL_COMMUNITY): Payer: Self-pay | Admitting: Radiology

## 2016-01-05 DIAGNOSIS — K529 Noninfective gastroenteritis and colitis, unspecified: Secondary | ICD-10-CM | POA: Diagnosis not present

## 2016-01-05 DIAGNOSIS — K5730 Diverticulosis of large intestine without perforation or abscess without bleeding: Secondary | ICD-10-CM | POA: Diagnosis not present

## 2016-01-05 DIAGNOSIS — K429 Umbilical hernia without obstruction or gangrene: Secondary | ICD-10-CM | POA: Diagnosis not present

## 2016-01-05 MED ORDER — IOPAMIDOL (ISOVUE-300) INJECTION 61%
80.0000 mL | Freq: Once | INTRAVENOUS | Status: AC | PRN
  Administered 2016-01-05: 100 mL via INTRAVENOUS

## 2016-01-05 MED ORDER — DIPHENOXYLATE-ATROPINE 2.5-0.025 MG PO TABS: 2.0000 | ORAL_TABLET | Freq: Four times a day (QID) | ORAL | Status: DC | PRN

## 2016-01-05 MED ORDER — ONDANSETRON 8 MG PO TBDP: 8.0000 mg | ORAL_TABLET | Freq: Three times a day (TID) | ORAL | Status: DC | PRN

## 2016-01-05 NOTE — Discharge Instructions (Signed)

## 2016-01-15 ENCOUNTER — Ambulatory Visit (HOSPITAL_BASED_OUTPATIENT_CLINIC_OR_DEPARTMENT_OTHER): Payer: Medicare Other

## 2016-01-15 ENCOUNTER — Telehealth: Payer: Self-pay | Admitting: Oncology

## 2016-01-15 ENCOUNTER — Other Ambulatory Visit (HOSPITAL_BASED_OUTPATIENT_CLINIC_OR_DEPARTMENT_OTHER): Payer: Medicare Other

## 2016-01-15 ENCOUNTER — Ambulatory Visit (HOSPITAL_BASED_OUTPATIENT_CLINIC_OR_DEPARTMENT_OTHER): Payer: Medicare Other | Admitting: Oncology

## 2016-01-15 VITALS — BP 139/62 | HR 18 | Temp 98.2°F | Resp 18 | Ht 63.0 in | Wt 171.1 lb

## 2016-01-15 DIAGNOSIS — D696 Thrombocytopenia, unspecified: Secondary | ICD-10-CM

## 2016-01-15 DIAGNOSIS — D631 Anemia in chronic kidney disease: Secondary | ICD-10-CM | POA: Diagnosis not present

## 2016-01-15 DIAGNOSIS — N183 Chronic kidney disease, stage 3 unspecified: Secondary | ICD-10-CM

## 2016-01-15 DIAGNOSIS — D469 Myelodysplastic syndrome, unspecified: Secondary | ICD-10-CM | POA: Diagnosis present

## 2016-01-15 DIAGNOSIS — D649 Anemia, unspecified: Secondary | ICD-10-CM

## 2016-01-15 DIAGNOSIS — N189 Chronic kidney disease, unspecified: Secondary | ICD-10-CM | POA: Diagnosis not present

## 2016-01-15 LAB — CBC WITH DIFFERENTIAL/PLATELET
BASO%: 0 % (ref 0.0–2.0)
BASOS ABS: 0 10*3/uL (ref 0.0–0.1)
EOS%: 2 % (ref 0.0–7.0)
Eosinophils Absolute: 0.1 10*3/uL (ref 0.0–0.5)
HEMATOCRIT: 32.4 % — AB (ref 34.8–46.6)
HGB: 10.8 g/dL — ABNORMAL LOW (ref 11.6–15.9)
LYMPH#: 1.9 10*3/uL (ref 0.9–3.3)
LYMPH%: 53.1 % — ABNORMAL HIGH (ref 14.0–49.7)
MCH: 28.4 pg (ref 25.1–34.0)
MCHC: 33.3 g/dL (ref 31.5–36.0)
MCV: 85.3 fL (ref 79.5–101.0)
MONO#: 0.2 10*3/uL (ref 0.1–0.9)
MONO%: 5.1 % (ref 0.0–14.0)
NEUT#: 1.4 10*3/uL — ABNORMAL LOW (ref 1.5–6.5)
NEUT%: 39.8 % (ref 38.4–76.8)
Platelets: 128 10*3/uL — ABNORMAL LOW (ref 145–400)
RBC: 3.8 10*6/uL (ref 3.70–5.45)
RDW: 15.7 % — ABNORMAL HIGH (ref 11.2–14.5)
WBC: 3.5 10*3/uL — ABNORMAL LOW (ref 3.9–10.3)

## 2016-01-15 MED ORDER — DARBEPOETIN ALFA 300 MCG/0.6ML IJ SOSY
300.0000 ug | PREFILLED_SYRINGE | INTRAMUSCULAR | Status: DC
  Administered 2016-01-15: 300 ug via SUBCUTANEOUS
  Filled 2016-01-15: qty 0.6

## 2016-01-15 NOTE — Telephone Encounter (Signed)
per pof to sch pt appt-gave pt copy of avs °

## 2016-01-15 NOTE — Progress Notes (Signed)
Hematology and Oncology Follow Up Visit  Susan [name] [id number] 03-31-1941 75 y.o. 01/15/2016 2:53 PM [name],Susan DAVID, MDMcKeown, Susan Saxon, MD   Principle Diagnosis: This is a 75 year old woman with anemia of renal disease diagnosed in 2010. She also has element of early myelodysplasia.  Secondary Diagnosis: Controlled hypertension.  Current therapy: She is on Aranesp 300 mcg every 3 weeks to keep her hemoglobin above 11.  Interim History:  Mrs. [name] presents today for a follow-up visit. Since her last visit, she reports developing a gastrointestinal illness that required trip to the emergency department and her evaluation was unrevealing. She developed nausea, vomiting and diarrhea which have currently resolved. He is able to eat and keep food down without any issues. She resumed all activities of daily living including driving and ambulating. He does report occasional dyspnea on exertion especially if her hemoglobin drifts down below 11. She denied any hematochezia or melena. Denies any changes in her weight or appetite.   She does not report any headaches, blurry vision, syncope or seizures. She denies bleeding, bruising or lymphadenopathy. He has not reported any fevers or chills or sweats. She has not reported any chest pain or palpitation. Does not report any shortness of breath or wheezing. She does not report any abdominal pain or early satiety. She does not report any hematochezia or melena.  Rest of her review of systems unremarkable.  Medications: I have reviewed the patient's current medications.  Current Outpatient Prescriptions  Medication Sig Dispense Refill  . aspirin 81 MG chewable tablet Chew 81 mg by mouth every morning.     [name] azelastine (ASTELIN) 0.1 % nasal spray Place 2 sprays into both nostrils 2 (two) times daily. Use in each nostril as directed 30 mL 2  . Cetirizine HCl 10 MG CAPS Take 1 capsule (10 mg total) by mouth at bedtime. (Patient taking differently: Take  10 mg by mouth at bedtime as needed (allergies). ) 90 capsule 0  . Cholecalciferol (VITAMIN D-3) 5000 UNITS TABS Take 10,000 Units by mouth daily.     . Darbepoetin Alfa-Albumin (ARANESP IJ) Inject as directed every 21 ( twenty-one) days.     . diclofenac (VOLTAREN) 75 MG EC tablet TAKE 1 TABLET(75 MG) BY MOUTH TWICE DAILY 60 tablet 5  . diphenoxylate-atropine (LOMOTIL) 2.5-0.025 MG tablet Take 2 tablets by mouth 4 (four) times daily as needed for diarrhea or loose stools. 30 tablet 0  . HYDROcodone-acetaminophen (NORCO) 5-325 MG tablet Take 1 tablet by mouth every 6 (six) hours as needed (Cough). Max: 4 tablets a day 10 tablet 0  . ipratropium (ATROVENT) 0.03 % nasal spray Take 1 to 2 sprays into each nostril up to 2 or 3 x day as needed (Patient taking differently: Place 1 spray into both nostrils daily as needed for rhinitis. Take 1 to 2 sprays into each nostril up to 2 or 3 x day as needed) 30 mL 99  . labetalol (NORMODYNE) 300 MG tablet Take 1 tablet (300 mg total) by mouth 2 (two) times daily. 180 tablet 99  . levothyroxine (SYNTHROID) 100 MCG tablet Take 1 tablet daily on an empty stomach for 30 minutes (Patient taking differently: Take 100 mcg by mouth daily before breakfast. Take an empty stomach for 30 minutes) 90 tablet 1  . losartan-hydrochlorothiazide (HYZAAR) 100-25 MG tablet Take 1 tablet by mouth daily.  1  . ondansetron (ZOFRAN ODT) 8 MG disintegrating tablet Take 1 tablet (8 mg total) by mouth every 8 (eight) hours as needed for  nausea or vomiting. 20 tablet 0  . pantoprazole (PROTONIX) 40 MG tablet TAKE 1 TABLET BY MOUTH EVERY DAY FOR ACID REFLUX (Patient taking differently: TAKE40 MG BY MOUTH EVERY DAY FOR ACID REFLUX) 90 tablet 0  . amLODipine (NORVASC) 10 MG tablet Take 1/2 to 1 tablet daily for BP (Patient taking differently: Take 5 mg by mouth at bedtime. Take 1/2 to 1 tablet daily for BP) 30 tablet 5   No current facility-administered medications for this visit.    Facility-Administered Medications Ordered in Other Visits  Medication Dose Route Frequency Provider Last Rate Last Dose  . Darbepoetin Alfa (ARANESP) injection 300 mcg  300 mcg Subcutaneous Q21 days Maryanna Shape, NP         Past Medical History, Surgical history, Social history, and Family History were reviewed and updated.  [name]  Physical Exam: Blood pressure 139/62, pulse 18, temperature 98.2 F (36.8 C), temperature source Oral, resp. rate 18, height 5' 3" (1.6 m), weight 171 lb 1.6 oz (77.61 kg), SpO2 100 %. ECOG: 1 General appearance: Alert, awake woman without distress. Head: Normocephalic, without obvious abnormality no oral ulcers or lesions. Neck: no adenopathy Lymph nodes: Cervical, supraclavicular, and axillary nodes normal. Heart:regular rate and rhythm, S1, S2 normal, no murmur, click, rub or gallop Lung:chest clear, no wheezing, rales, normal symmetric air entry Abdomen: soft, non-tender, without masses or organomegaly no rebound or guarding. EXT:no erythema, induration, or nodules   Lab Results: Lab Results  Component Value Date   WBC 3.5* 01/15/2016   HGB 10.8* 01/15/2016   HCT 32.4* 01/15/2016   MCV 85.3 01/15/2016   PLT 128* 01/15/2016     Chemistry      Component Value Date/Time   NA 140 01/04/2016 2218   K 4.1 01/04/2016 2218   CL 104 01/04/2016 2218   CO2 24 01/04/2016 2218   BUN 25* 01/04/2016 2218   CREATININE 1.64* 01/04/2016 2218   CREATININE 1.55* 12/14/2015 0915      Component Value Date/Time   CALCIUM 10.3 01/04/2016 2218   ALKPHOS 102 01/04/2016 2218   AST 21 01/04/2016 2218   ALT 12* 01/04/2016 2218   BILITOT 1.0 01/04/2016 2218      Impression and Plan:  This is a pleasant 75 year old female with the following issues:  1. 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 She is currently receiving Aranesp every 3 weeks to keep her hemoglobin above 11 where she functions the best. Her hemoglobin is 10.8 and will receive and injection at this  time. I will check her iron studies to make sure they're adequate and we'll replace as needed. 2. Hypertension:  Her blood pressure is better control at this time. 3. History of questionable myelodysplasia: No major changes in her counts and will continue to monitor this. We will consider repeat bone marrow biopsy in the future if she develops any worsening cytopenias. 4. Thrombocytopenia: Could be related to myelodysplasia. Unchanged at this time without any evidence of bleeding. 5. Follow up. Every 3 weeks for CBC and injection. Visit in about 4 months.       [facility] MD 4/14/20172:53 PM

## 2016-02-05 ENCOUNTER — Ambulatory Visit: Payer: Medicare Other

## 2016-02-05 ENCOUNTER — Other Ambulatory Visit (HOSPITAL_BASED_OUTPATIENT_CLINIC_OR_DEPARTMENT_OTHER): Payer: Medicare Other

## 2016-02-05 DIAGNOSIS — D469 Myelodysplastic syndrome, unspecified: Secondary | ICD-10-CM

## 2016-02-05 DIAGNOSIS — D649 Anemia, unspecified: Secondary | ICD-10-CM | POA: Diagnosis not present

## 2016-02-05 LAB — IRON AND TIBC
%SAT: 33 % (ref 21–57)
IRON: 93 ug/dL (ref 41–142)
TIBC: 280 ug/dL (ref 236–444)
UIBC: 188 ug/dL (ref 120–384)

## 2016-02-05 LAB — CBC WITH DIFFERENTIAL/PLATELET
BASO%: 0 % (ref 0.0–2.0)
Basophils Absolute: 0 10*3/uL (ref 0.0–0.1)
EOS%: 2.4 % (ref 0.0–7.0)
Eosinophils Absolute: 0.1 10*3/uL (ref 0.0–0.5)
HEMATOCRIT: 35.4 % (ref 34.8–46.6)
HGB: 12.1 g/dL (ref 11.6–15.9)
LYMPH#: 1.7 10*3/uL (ref 0.9–3.3)
LYMPH%: 40 % (ref 14.0–49.7)
MCH: 28.3 pg (ref 25.1–34.0)
MCHC: 34.2 g/dL (ref 31.5–36.0)
MCV: 82.7 fL (ref 79.5–101.0)
MONO#: 0.4 10*3/uL (ref 0.1–0.9)
MONO%: 9 % (ref 0.0–14.0)
NEUT#: 2 10*3/uL (ref 1.5–6.5)
NEUT%: 48.6 % (ref 38.4–76.8)
Platelets: 113 10*3/uL — ABNORMAL LOW (ref 145–400)
RBC: 4.28 10*6/uL (ref 3.70–5.45)
RDW: 16.2 % — AB (ref 11.2–14.5)
WBC: 4.1 10*3/uL (ref 3.9–10.3)

## 2016-02-05 LAB — FERRITIN: Ferritin: 34 ng/ml (ref 9–269)

## 2016-02-05 NOTE — Progress Notes (Signed)
Pt's Hemoglobin at 12.1. Injection noted needed. Pt instructed to call for issues and continue with current sch. Pt verbalized instructions.

## 2016-02-08 ENCOUNTER — Other Ambulatory Visit: Payer: Self-pay | Admitting: *Deleted

## 2016-02-08 MED ORDER — LOSARTAN POTASSIUM-HCTZ 100-25 MG PO TABS: 1.0000 | ORAL_TABLET | Freq: Every day | ORAL | Status: DC

## 2016-02-26 ENCOUNTER — Ambulatory Visit (HOSPITAL_BASED_OUTPATIENT_CLINIC_OR_DEPARTMENT_OTHER): Payer: Medicare Other

## 2016-02-26 ENCOUNTER — Other Ambulatory Visit (HOSPITAL_BASED_OUTPATIENT_CLINIC_OR_DEPARTMENT_OTHER): Payer: Medicare Other

## 2016-02-26 VITALS — BP 113/74 | HR 67 | Temp 97.9°F | Resp 18

## 2016-02-26 DIAGNOSIS — D469 Myelodysplastic syndrome, unspecified: Secondary | ICD-10-CM

## 2016-02-26 DIAGNOSIS — D649 Anemia, unspecified: Secondary | ICD-10-CM

## 2016-02-26 LAB — CBC WITH DIFFERENTIAL/PLATELET
BASO%: 0 % (ref 0.0–2.0)
Basophils Absolute: 0 10*3/uL (ref 0.0–0.1)
EOS%: 2.3 % (ref 0.0–7.0)
Eosinophils Absolute: 0.1 10*3/uL (ref 0.0–0.5)
HCT: 30.3 % — ABNORMAL LOW (ref 34.8–46.6)
HGB: 10.3 g/dL — ABNORMAL LOW (ref 11.6–15.9)
LYMPH%: 47.7 % (ref 14.0–49.7)
MCH: 27.5 pg (ref 25.1–34.0)
MCHC: 34 g/dL (ref 31.5–36.0)
MCV: 80.8 fL (ref 79.5–101.0)
MONO#: 0.3 10*3/uL (ref 0.1–0.9)
MONO%: 8.3 % (ref 0.0–14.0)
NEUT%: 41.7 % (ref 38.4–76.8)
NEUTROS ABS: 1.7 10*3/uL (ref 1.5–6.5)
NRBC: 0 % (ref 0–0)
Platelets: 135 10*3/uL — ABNORMAL LOW (ref 145–400)
RBC: 3.75 10*6/uL (ref 3.70–5.45)
RDW: 15.9 % — AB (ref 11.2–14.5)
WBC: 4 10*3/uL (ref 3.9–10.3)
lymph#: 1.9 10*3/uL (ref 0.9–3.3)

## 2016-02-26 MED ORDER — DARBEPOETIN ALFA 300 MCG/0.6ML IJ SOSY
300.0000 ug | PREFILLED_SYRINGE | INTRAMUSCULAR | Status: DC
  Administered 2016-02-26: 300 ug via SUBCUTANEOUS
  Filled 2016-02-26: qty 0.6

## 2016-02-26 NOTE — Patient Instructions (Signed)
Darbepoetin Alfa injection What is this medicine? DARBEPOETIN ALFA (dar be POE e tin AL fa) helps your body make more red blood cells. It is used to treat anemia caused by chronic kidney failure and chemotherapy. This medicine may be used for other purposes; ask your health care provider or pharmacist if you have questions. COMMON BRAND NAME(S): Aranesp What should I tell my health care provider before I take this medicine? They need to know if you have any of these conditions: -blood clotting disorders or history of blood clots -cancer patient not on chemotherapy -cystic fibrosis -heart disease, such as angina, heart failure, or a history of a heart attack -hemoglobin level of 12 g/dL or greater -high blood pressure -low levels of folate, iron, or vitamin B12 -seizures -an unusual or allergic reaction to darbepoetin, erythropoietin, albumin, hamster proteins, latex, other medicines, foods, dyes, or preservatives -pregnant or trying to get pregnant -breast-feeding How should I use this medicine? This medicine is for injection into a vein or under the skin. It is usually given by a health care professional in a hospital or clinic setting. If you get this medicine at home, you will be taught how to prepare and give this medicine. Do not shake the solution before you withdraw a dose. Use exactly as directed. Take your medicine at regular intervals. Do not take your medicine more often than directed. It is important that you put your used needles and syringes in a special sharps container. Do not put them in a trash can. If you do not have a sharps container, call your pharmacist or healthcare provider to get one. Talk to your pediatrician regarding the use of this medicine in children. While this medicine may be used in children as young as 1 year for selected conditions, precautions do apply. Overdosage: If you think you have taken too much of this medicine contact a poison control center or  emergency room at once. NOTE: This medicine is only for you. Do not share this medicine with others. What if I miss a dose? If you miss a dose, take it as soon as you can. If it is almost time for your next dose, take only that dose. Do not take double or extra doses. What may interact with this medicine? Do not take this medicine with any of the following medications: -epoetin alfa This list may not describe all possible interactions. Give your health care provider a list of all the medicines, herbs, non-prescription drugs, or dietary supplements you use. Also tell them if you smoke, drink alcohol, or use illegal drugs. Some items may interact with your medicine. What should I watch for while using this medicine? Visit your prescriber or health care professional for regular checks on your progress and for the needed blood tests and blood pressure measurements. It is especially important for the doctor to make sure your hemoglobin level is in the desired range, to limit the risk of potential side effects and to give you the best benefit. Keep all appointments for any recommended tests. Check your blood pressure as directed. Ask your doctor what your blood pressure should be and when you should contact him or her. As your body makes more red blood cells, you may need to take iron, folic acid, or vitamin B supplements. Ask your doctor or health care provider which products are right for you. If you have kidney disease continue dietary restrictions, even though this medication can make you feel better. Talk with your doctor or health   care professional about the foods you eat and the vitamins that you take. What side effects may I notice from receiving this medicine? Side effects that you should report to your doctor or health care professional as soon as possible: -allergic reactions like skin rash, itching or hives, swelling of the face, lips, or tongue -breathing problems -changes in vision -chest  pain -confusion, trouble speaking or understanding -feeling faint or lightheaded, falls -high blood pressure -muscle aches or pains -pain, swelling, warmth in the leg -rapid weight gain -severe headaches -sudden numbness or weakness of the face, arm or leg -trouble walking, dizziness, loss of balance or coordination -seizures (convulsions) -swelling of the ankles, feet, hands -unusually weak or tired Side effects that usually do not require medical attention (report to your doctor or health care professional if they continue or are bothersome): -diarrhea -fever, chills (flu-like symptoms) -headaches -nausea, vomiting -redness, stinging, or swelling at site where injected This list may not describe all possible side effects. Call your doctor for medical advice about side effects. You may report side effects to FDA at 1-800-FDA-1088. Where should I keep my medicine? Keep out of the reach of children. Store in a refrigerator between 2 and 8 degrees C (36 and 46 degrees F). Do not freeze. Do not shake. Throw away any unused portion if using a single-dose vial. Throw away any unused medicine after the expiration date. NOTE: This sheet is a summary. It may not cover all possible information. If you have questions about this medicine, talk to your doctor, pharmacist, or health care provider.  2015, [employer]. (2008-09-02 10:23:57)  

## 2016-03-08 ENCOUNTER — Encounter (HOSPITAL_COMMUNITY): Payer: Self-pay | Admitting: *Deleted

## 2016-03-08 ENCOUNTER — Ambulatory Visit (INDEPENDENT_AMBULATORY_CARE_PROVIDER_SITE_OTHER): Payer: Medicare Other | Admitting: Physician Assistant

## 2016-03-08 ENCOUNTER — Emergency Department (HOSPITAL_COMMUNITY)
Admission: EM | Admit: 2016-03-08 | Discharge: 2016-03-08 | Disposition: A | Discharge status: Home / Self Care | Payer: Medicare Other | Attending: Emergency Medicine | Admitting: Emergency Medicine

## 2016-03-08 ENCOUNTER — Emergency Department (HOSPITAL_COMMUNITY): Payer: Medicare Other

## 2016-03-08 ENCOUNTER — Encounter: Payer: Self-pay | Admitting: Physician Assistant

## 2016-03-08 VITALS — BP 122/70 | HR 62 | Temp 98.0°F | Resp 16 | Ht 63.0 in | Wt 169.0 lb

## 2016-03-08 DIAGNOSIS — R270 Ataxia, unspecified: Secondary | ICD-10-CM | POA: Diagnosis not present

## 2016-03-08 DIAGNOSIS — R42 Dizziness and giddiness: Secondary | ICD-10-CM | POA: Diagnosis not present

## 2016-03-08 DIAGNOSIS — M1990 Unspecified osteoarthritis, unspecified site: Secondary | ICD-10-CM | POA: Insufficient documentation

## 2016-03-08 DIAGNOSIS — R197 Diarrhea, unspecified: Secondary | ICD-10-CM | POA: Insufficient documentation

## 2016-03-08 DIAGNOSIS — Z87891 Personal history of nicotine dependence: Secondary | ICD-10-CM | POA: Diagnosis not present

## 2016-03-08 DIAGNOSIS — E039 Hypothyroidism, unspecified: Secondary | ICD-10-CM | POA: Insufficient documentation

## 2016-03-08 DIAGNOSIS — Q069 Congenital malformation of spinal cord, unspecified: Secondary | ICD-10-CM | POA: Diagnosis not present

## 2016-03-08 DIAGNOSIS — R1032 Left lower quadrant pain: Secondary | ICD-10-CM

## 2016-03-08 DIAGNOSIS — Z7982 Long term (current) use of aspirin: Secondary | ICD-10-CM | POA: Diagnosis not present

## 2016-03-08 DIAGNOSIS — I10 Essential (primary) hypertension: Secondary | ICD-10-CM | POA: Diagnosis not present

## 2016-03-08 DIAGNOSIS — Z79899 Other long term (current) drug therapy: Secondary | ICD-10-CM | POA: Insufficient documentation

## 2016-03-08 DIAGNOSIS — Z880 Allergy status to penicillin: Secondary | ICD-10-CM | POA: Diagnosis not present

## 2016-03-08 DIAGNOSIS — Z862 Personal history of diseases of the blood and blood-forming organs and certain disorders involving the immune mechanism: Secondary | ICD-10-CM | POA: Insufficient documentation

## 2016-03-08 DIAGNOSIS — E559 Vitamin D deficiency, unspecified: Secondary | ICD-10-CM | POA: Insufficient documentation

## 2016-03-08 DIAGNOSIS — K219 Gastro-esophageal reflux disease without esophagitis: Secondary | ICD-10-CM | POA: Diagnosis not present

## 2016-03-08 DIAGNOSIS — R531 Weakness: Secondary | ICD-10-CM | POA: Insufficient documentation

## 2016-03-08 LAB — URINALYSIS, ROUTINE W REFLEX MICROSCOPIC
Bilirubin Urine: NEGATIVE
GLUCOSE, UA: NEGATIVE mg/dL
Hgb urine dipstick: NEGATIVE
Ketones, ur: NEGATIVE mg/dL
LEUKOCYTES UA: NEGATIVE
NITRITE: NEGATIVE
PH: 5 (ref 5.0–8.0)
Protein, ur: NEGATIVE mg/dL
SPECIFIC GRAVITY, URINE: 1.015 (ref 1.005–1.030)

## 2016-03-08 LAB — BASIC METABOLIC PANEL
ANION GAP: 8 (ref 5–15)
BUN: 19 mg/dL (ref 6–20)
CHLORIDE: 107 mmol/L (ref 101–111)
CO2: 24 mmol/L (ref 22–32)
Calcium: 9.8 mg/dL (ref 8.9–10.3)
Creatinine, Ser: 1.67 mg/dL — ABNORMAL HIGH (ref 0.44–1.00)
GFR calc Af Amer: 33 mL/min — ABNORMAL LOW (ref >60)
GFR, EST NON AFRICAN AMERICAN: 29 mL/min — AB (ref >60)
GLUCOSE: 86 mg/dL (ref 65–99)
POTASSIUM: 3.9 mmol/L (ref 3.5–5.1)
Sodium: 139 mmol/L (ref 135–145)

## 2016-03-08 LAB — CBG MONITORING, ED: Glucose-Capillary: 80 mg/dL (ref 65–99)

## 2016-03-08 LAB — CBC
HEMATOCRIT: 34.4 % — AB (ref 36.0–46.0)
HEMOGLOBIN: 11.1 g/dL — AB (ref 12.0–15.0)
MCH: 26.8 pg (ref 26.0–34.0)
MCHC: 32.3 g/dL (ref 30.0–36.0)
MCV: 83.1 fL (ref 78.0–100.0)
Platelets: 158 10*3/uL (ref 150–400)
RBC: 4.14 MIL/uL (ref 3.87–5.11)
RDW: 17.7 % — ABNORMAL HIGH (ref 11.5–15.5)
WBC: 4 10*3/uL (ref 4.0–10.5)

## 2016-03-08 MED ORDER — LORAZEPAM 2 MG/ML IJ SOLN: 0.5000 mg | Freq: Once | INTRAMUSCULAR | Status: DC | PRN

## 2016-03-08 MED ORDER — SODIUM CHLORIDE 0.9 % IV BOLUS (SEPSIS)
1000.0000 mL | Freq: Once | INTRAVENOUS | Status: AC
Start: 2016-03-08 — End: 2016-03-08
  Administered 2016-03-08: 1000 mL via INTRAVENOUS

## 2016-03-08 MED ORDER — MECLIZINE HCL 12.5 MG PO TABS: 12.5000 mg | ORAL_TABLET | Freq: Three times a day (TID) | ORAL | Status: DC | PRN

## 2016-03-08 MED ORDER — MECLIZINE HCL 25 MG PO TABS
25.0000 mg | ORAL_TABLET | Freq: Once | ORAL | Status: AC
  Administered 2016-03-08: 25 mg via ORAL
  Filled 2016-03-08: qty 1

## 2016-03-08 NOTE — ED Notes (Signed)
Pt c/o dizziness when sitting/standing

## 2016-03-08 NOTE — Patient Instructions (Signed)
Diverticulitis °Diverticulitis is inflammation or infection of small pouches in your colon that form when you have a condition called diverticulosis. The pouches in your colon are called diverticula. Your colon, or large intestine, is where water is absorbed and stool is formed. °Complications of diverticulitis can include: °· Bleeding. °· Severe infection. °· Severe pain. °· Perforation of your colon. °· Obstruction of your colon. °CAUSES  °Diverticulitis is caused by bacteria. °Diverticulitis happens when stool becomes trapped in diverticula. This allows bacteria to grow in the diverticula, which can lead to inflammation and infection. °RISK FACTORS °People with diverticulosis are at risk for diverticulitis. Eating a diet that does not include enough fiber from fruits and vegetables may make diverticulitis more likely to develop. °SYMPTOMS  °Symptoms of diverticulitis may include: °· Abdominal pain and tenderness. The pain is normally located on the left side of the abdomen, but may occur in other areas. °· Fever and chills. °· Bloating. °· Cramping. °· Nausea. °· Vomiting. °· Constipation. °· Diarrhea. °· Blood in your stool. °DIAGNOSIS  °Your health care provider will ask you about your medical history and do a physical exam. You may need to have tests done because many medical conditions can cause the same symptoms as diverticulitis. Tests may include: °· Blood tests. °· Urine tests. °· Imaging tests of the abdomen, including X-rays and CT scans. °When your condition is under control, your health care provider may recommend that you have a colonoscopy. A colonoscopy can show how severe your diverticula are and whether something else is causing your symptoms. °TREATMENT  °Most cases of diverticulitis are mild and can be treated at home. Treatment may include: °· Taking over-the-counter pain medicines. °· Following a clear liquid diet. °· Taking antibiotic medicines by mouth for 7-10 days. °More severe cases may  be treated at a hospital. Treatment may include: °· Not eating or drinking. °· Taking prescription pain medicine. °· Receiving antibiotic medicines through an IV tube. °· Receiving fluids and nutrition through an IV tube. °· Surgery. °HOME CARE INSTRUCTIONS  °· Follow your health care provider's instructions carefully. °· Follow a full liquid diet or other diet as directed by your health care provider. After your symptoms improve, your health care provider may tell you to change your diet. He or she may recommend you eat a high-fiber diet. Fruits and vegetables are good sources of fiber. Fiber makes it easier to pass stool. °· Take fiber supplements or probiotics as directed by your health care provider. °· Only take medicines as directed by your health care provider. °· Keep all your follow-up appointments. °SEEK MEDICAL CARE IF:  °· Your pain does not improve. °· You have a hard time eating food. °· Your bowel movements do not return to normal. °SEEK IMMEDIATE MEDICAL CARE IF:  °· Your pain becomes worse. °· Your symptoms do not get better. °· Your symptoms suddenly get worse. °· You have a fever. °· You have repeated vomiting. °· You have bloody or black, tarry stools. °MAKE SURE YOU:  °· Understand these instructions. °· Will watch your condition. °· Will get help right away if you are not doing well or get worse. °  °This information is not intended to replace advice given to you by your health care provider. Make sure you discuss any questions you have with your health care provider. °  °Document Released: 06/29/2005 Document Revised: 09/24/2013 Document Reviewed: 08/14/2013 °Elsevier Interactive Patient Education ©2016 [employer] ° °

## 2016-03-08 NOTE — ED Notes (Signed)
Patient has returned from being out of the department; patient placed back on monitor, continuous pulse oximetry and blood pressure cuff 

## 2016-03-08 NOTE — ED Notes (Signed)
Pt reports that she was instructed to come to ED while at PCP office for further eval of diarrhea and dizziness .

## 2016-03-08 NOTE — Progress Notes (Signed)
Subjective:    Patient ID: Susan [name], female    DOB: 1941-06-04, 75 y.o.   MRN: [id number]  HPI 75 y.o. AAF with history of anemia, MDS (follows Dr. Luisa Dago and gets aransep injections), HTN, hypothyroidism, GERD, presents with diarrhea and dizziness x 4 days. Diarrhea started Friday, then started to have dizziness Sunday. Friday lost count how many times, amount has been decreasing, yesterday only 3 days, has been taking old RX medication that has been helping. Is becoming more formed, has been eating potatoes/toast, trying to stay hydrated with gatorade/gingerale and water. Diarrhea has been associated with abdominal pain described as cramping. Patient denies blood in stool, fever, illness in household contacts, recent antibiotic use, recent travel. Patient denies recent ABX use. Last colonoscopy: 2008 [name].  No vertigo, more weakness/passing out, worse with standing up. Has still been taking her BP meds.  Lab Results  Component Value Date   TSH 2.03 12/14/2015   Lab Results  Component Value Date   CREATININE 1.64* 01/04/2016   BUN 25* 01/04/2016   NA 140 01/04/2016   K 4.1 01/04/2016   CL 104 01/04/2016   CO2 24 01/04/2016   Wt Readings from Last 3 Encounters:  03/08/16 169 lb (76.658 kg)  01/15/16 171 lb 1.6 oz (77.61 kg)  12/14/15 171 lb (77.565 kg)    Blood pressure 122/70, pulse 62, temperature 98 F (36.7 C), temperature source Temporal, resp. rate 16, height 5\' 3"  (1.6 m), weight 169 lb (76.658 kg).  Past Medical History  Diagnosis Date  . Anemia   . Arthritis   . Myelodysplasia   . Hypertension   . HLD (hyperlipidemia)   . Hypothyroidism   . GERD (gastroesophageal reflux disease)   . Vitamin D deficiency    Current Outpatient Prescriptions on File Prior to Visit  Medication Sig Dispense Refill  . amLODipine (NORVASC) 10 MG tablet Take 1/2 to 1 tablet daily for BP (Patient taking differently: Take 5 mg by mouth at bedtime. Take 1/2 to 1 tablet daily for  BP) 30 tablet 5  . aspirin 81 MG chewable tablet Chew 81 mg by mouth every morning.     [name] azelastine (ASTELIN) 0.1 % nasal spray Place 2 sprays into both nostrils 2 (two) times daily. Use in each nostril as directed 30 mL 2  . Cetirizine HCl 10 MG CAPS Take 1 capsule (10 mg total) by mouth at bedtime. (Patient taking differently: Take 10 mg by mouth at bedtime as needed (allergies). ) 90 capsule 0  . Cholecalciferol (VITAMIN D-3) 5000 UNITS TABS Take 10,000 Units by mouth daily.     . Darbepoetin Alfa-Albumin (ARANESP IJ) Inject as directed every 21 ( twenty-one) days.     . diclofenac (VOLTAREN) 75 MG EC tablet TAKE 1 TABLET(75 MG) BY MOUTH TWICE DAILY 60 tablet 5  . diphenoxylate-atropine (LOMOTIL) 2.5-0.025 MG tablet Take 2 tablets by mouth 4 (four) times daily as needed for diarrhea or loose stools. 30 tablet 0  . ipratropium (ATROVENT) 0.03 % nasal spray Take 1 to 2 sprays into each nostril up to 2 or 3 x day as needed (Patient taking differently: Place 1 spray into both nostrils daily as needed for rhinitis. Take 1 to 2 sprays into each nostril up to 2 or 3 x day as needed) 30 mL 99  . labetalol (NORMODYNE) 300 MG tablet Take 1 tablet (300 mg total) by mouth 2 (two) times daily. 180 tablet 99  . levothyroxine (SYNTHROID) 100 MCG tablet  Take 1 tablet daily on an empty stomach for 30 minutes (Patient taking differently: Take 100 mcg by mouth daily before breakfast. Take an empty stomach for 30 minutes) 90 tablet 1  . losartan-hydrochlorothiazide (HYZAAR) 100-25 MG per tablet Take 1 tablet daily for BP (Patient taking differently: Take 0.5 tablets by mouth daily. ) 90 tablet 1  . losartan-hydrochlorothiazide (HYZAAR) 100-25 MG tablet Take 1 tablet by mouth daily. 90 tablet 1  . pantoprazole (PROTONIX) 40 MG tablet TAKE 1 TABLET BY MOUTH EVERY DAY FOR ACID REFLUX (Patient taking differently: No sig reported) 90 tablet 0   No current facility-administered medications on file prior to visit.      Review of Systems  Constitutional: Positive for fatigue. Negative for fever, chills, diaphoresis and appetite change.  HENT: Negative.   Respiratory: Negative.  Negative for cough.   Cardiovascular: Negative.   Gastrointestinal: Positive for nausea, abdominal pain and diarrhea. Negative for vomiting, constipation, blood in stool, abdominal distention, anal bleeding and rectal pain.  Genitourinary: Negative.   Musculoskeletal: Negative for myalgias, back pain, joint swelling, arthralgias, gait problem, neck pain and neck stiffness.  Skin: Negative.  Negative for rash.  Neurological: Positive for dizziness and light-headedness. Negative for tremors, seizures, syncope, facial asymmetry, speech difficulty, numbness and headaches.       Objective:   Physical Exam  Constitutional: She is oriented to person, place, and time. She appears well-developed and well-nourished. No distress.  Neck: Normal range of motion. Neck supple.  Cardiovascular: Normal rate and regular rhythm.   Murmur (RSB, holosystolic) heard. Pulmonary/Chest: Effort normal and breath sounds normal.  Abdominal: Soft. Bowel sounds are normal. She exhibits no mass. There is tenderness. There is guarding. There is no rebound.  Diffuse tenderness, more so LLQ pain with mild rebound tenderness, no other peritoneal signs.   Neurological: She is alert and oriented to person, place, and time.  Skin: Skin is warm and dry. No rash noted. She is not diaphoretic.  Vitals reviewed.     Assessment & Plan:  Diarrhea with dehydration/dizziness and LLQ pain with rebound tenderness Discussed trying to treat outpatient versus inpatient with the patient however since she lives alone and has the signs of hypotension/dehydration, she will go to the ER to get evaluated for fluids and rule out diverticulitis versus gastroenteritis.  Patient is coming by private vehicle.   Future Appointments Date Time Provider [place]  03/18/2016  8:00 AM [facility] LAB 3 [facility] None  03/18/2016 8:30 AM [facility] INJ NURSE [facility] None  04/08/2016 8:15 AM [facility] LAB 2 [facility] None  04/08/2016 8:45 AM [facility] INJ NURSE [facility] None  04/11/2016 9:00 AM Unk Pinto, MD [facility] None  04/29/2016 8:00 AM [facility] LAB 6 [facility] None  04/29/2016 8:30 AM [facility] INJ NURSE [facility] None  05/20/2016 8:00 AM [facility] LAB 3 [facility] None  05/20/2016 8:30 AM Wyatt Portela, MD [facility] None  05/20/2016 9:15 AM [facility] INJ NURSE [facility] None

## 2016-03-08 NOTE — ED Notes (Signed)
PT report diarrhea since Friday, denies vomiting.  Appetite decreased. Pt reports dizziness started on Sunday.  Pt sent here for dehydration. PT reports abdominal pain to LLQ

## 2016-03-08 NOTE — ED Notes (Signed)
  CBG 80

## 2016-03-08 NOTE — ED Provider Notes (Signed)
Diarrhea, intermittent history Friday, Sat Sun - lomotil with some improvement Dizzy now Neuro - nonfocal except increased dizziness with EOM gaze to right Vertigo vs CVA vs dehydration - pending MRI  Plan: re-[place] for improvement MRI - negative, ataxia improved - can go home  Patient care signed out at end of shift by Doreen Salvage, PA-C, with above history and plan. MRI reviewed and is negative. The patient is re-evaluated and reports she feels much better. She is ambulated and reports significant improvement in dizziness although still has minimal symptoms. She is balanced with ambulation. She has a friend that will stay with her tonight. Return precautions discussed. Ambulating safely was also discussed. Patient is stable for discharge home.   Charlann Lange, PA-C 03/08/16 2138  Malvin Johns, MD 03/08/16 2218

## 2016-03-08 NOTE — ED Notes (Signed)
Patient being transported to CT at this time 

## 2016-03-08 NOTE — ED Notes (Signed)
Pt given sandwich and gingerale without any problems

## 2016-03-08 NOTE — ED Notes (Signed)
Took patient to bathroom via wheelchair; ambulated patient back to room; patient was swaying as she walked and stated " I'm feeling better but I"m still a little dizzy"; patient placed back on monitor, continuous pulse oximetry and blood pressure cuff

## 2016-03-08 NOTE — ED Notes (Signed)
Pt informed of MRI and states that she is claustrophobic.

## 2016-03-08 NOTE — ED Notes (Signed)
Brought patient back to room vis wheelchair; patient getting undressed and into a gown at this time

## 2016-03-08 NOTE — ED Notes (Signed)
Patient brought back to room via wheelchair from the bathroom with urine specimen in hand; patient placed on monitor, continuous pulse oximetry and blood pressure cuff

## 2016-03-08 NOTE — ED Provider Notes (Signed)
CSN: [id number]     Arrival date & time 03/08/16  1145 History   First MD Initiated Contact with Patient 03/08/16 1340     Chief Complaint  Patient presents with  . Dizziness  . Weakness  . Diarrhea     (Consider location/radiation/quality/duration/timing/severity/associated sxs/prior Treatment) HPI   Susan [name] is a(n) 75 y.o. female who presents with cc of diarrhea, dizziness and ataxia. The patient states that she has had chronic intermittent diarrhea. This Bout began on Friday, 03/04/2016. She states she had severe diarrhea that day and Saturday. It seemed to improve on Sunday, worsened Monday. Patient took 1 dose of Lomotil which is slowed her diarrhea. The patient also complains of dizziness which is worse with standing and ambulating. 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 She states it is worse when she turns her head to the right.Denies headaches, weakness, visual disturbances, facial droop, difficulty with speech or vertigo. She denies abdominal pain except for tenesmus and cramping prior to diarrhea.  She denies cp, sob.   Past Medical History  Diagnosis Date  . Anemia   . Arthritis   . Myelodysplasia   . Hypertension   . HLD (hyperlipidemia)   . Hypothyroidism   . GERD (gastroesophageal reflux disease)   . Vitamin D deficiency    Past Surgical History  Procedure Laterality Date  . Umbilical hernia repair    . Rotator cuff repair Left   . Inguinal hernia repair    . Carpal tunnel release Right   . Dilation and curettage of uterus     Family History  Problem Relation Age of Onset  . Stroke Father   . Hypertension Father   . Stroke Sister   . Coronary artery disease Brother   . Hypertension Sister   . Bladder Cancer Sister   . Cancer Mother    Social History  Substance Use Topics  . Smoking status: Former Smoker    Types: Cigarettes    Quit date: 10/02/1996  . Smokeless tobacco: Never Used  .  Alcohol Use: 0.0 oz/week     Comment: glass of wine at least once a month.   OB History    No data available     Review of Systems  Ten systems reviewed and are negative for acute change, except as noted in the HPI.    Allergies  Codeine; Penicillins; Bactrim; Clarithromycin; Flagyl; and Sulfa antibiotics  Home Medications   Prior to Admission medications   Medication Sig Start Date End Date Taking? Authorizing Provider  amLODipine (NORVASC) 10 MG tablet Take 1/2 to 1 tablet daily for BP Patient taking differently: Take 5 mg by mouth at bedtime. Take 1/2 to 1 tablet daily for BP 03/09/15 03/08/16 Yes Unk Pinto, MD  aspirin 81 MG chewable tablet Chew 81 mg by mouth every morning.    Yes Historical Provider, MD  azelastine (ASTELIN) 0.1 % nasal spray Place 2 sprays into both nostrils 2 (two) times daily. Use in each nostril as directed 11/19/15 11/18/16 Yes Courtney Forcucci, PA-C  Cetirizine HCl 10 MG CAPS Take 1 capsule (10 mg total) by mouth at bedtime. Patient taking differently: Take 10 mg by mouth at bedtime as needed (allergies).  11/19/15  Yes Courtney Forcucci, PA-C  Cholecalciferol (VITAMIN D-3) 5000 UNITS TABS Take 10,000 Units by mouth daily.    Yes Historical Provider, MD  Darbepoetin Alfa-Albumin (ARANESP IJ) Inject as directed every 21 ( twenty-one)  days.    Yes Historical Provider, MD  diclofenac (VOLTAREN) 75 MG EC tablet TAKE 1 TABLET(75 MG) BY MOUTH TWICE DAILY 12/20/15  Yes Vicie Mutters, PA-C  diphenoxylate-atropine (LOMOTIL) 2.5-0.025 MG tablet Take 2 tablets by mouth 4 (four) times daily as needed for diarrhea or loose stools. 01/05/16  Yes Lacretia Leigh, MD  labetalol (NORMODYNE) 300 MG tablet Take 1 tablet (300 mg total) by mouth 2 (two) times daily. 06/01/15  Yes Unk Pinto, MD  levothyroxine (SYNTHROID) 100 MCG tablet Take 1 tablet daily on an empty stomach for 30 minutes Patient taking differently: Take 100 mcg by mouth daily before breakfast. Take an empty  stomach for 30 minutes 08/31/15  Yes Unk Pinto, MD  losartan-hydrochlorothiazide (HYZAAR) 100-25 MG per tablet Take 1 tablet daily for BP Patient taking differently: Take 0.5 tablets by mouth daily.  01/06/15 02/07/17 Yes Unk Pinto, MD  pantoprazole (PROTONIX) 40 MG tablet TAKE 1 TABLET BY MOUTH EVERY DAY FOR ACID REFLUX 12/20/15  Yes Vicie Mutters, PA-C  ipratropium (ATROVENT) 0.03 % nasal spray Take 1 to 2 sprays into each nostril up to 2 or 3 x day as needed Patient not taking: Reported on 03/08/2016 01/06/15   Unk Pinto, MD   BP 161/82 mmHg  Pulse 58  Temp(Src) 98.1 F (36.7 C) (Oral)  Resp 15  SpO2 99% Physical Exam  Constitutional: She is oriented to person, place, and time. She appears well-developed and well-nourished. No distress.  HENT:  Head: Normocephalic and atraumatic.  Eyes: Conjunctivae are normal. No scleral icterus.  Neck: Normal range of motion.  Cardiovascular: Normal rate, regular rhythm and normal heart sounds.  Exam reveals no gallop and no friction rub.   No murmur heard. Pulmonary/Chest: Effort normal and breath sounds normal. No respiratory distress.  Abdominal: Soft. Bowel sounds are normal. She exhibits no distension and no mass. There is tenderness (mild diffuse tenderness). There is no guarding.  Neurological: She is alert and oriented to person, place, and time.  Skin: Skin is warm and dry. She is not diaphoretic.  Nursing note and vitals reviewed.   ED Course  Procedures (including critical care time) Labs Review Labs Reviewed  BASIC METABOLIC PANEL - Abnormal; Notable for the following:    Creatinine, Ser 1.67 (*)    GFR calc non Af Amer 29 (*)    GFR calc Af Amer 33 (*)    All other components within normal limits  CBC - Abnormal; Notable for the following:    Hemoglobin 11.1 (*)    HCT 34.4 (*)    RDW 17.7 (*)    All other components within normal limits  URINALYSIS, ROUTINE W REFLEX MICROSCOPIC (NOT AT [facility]) - Abnormal; Notable  for the following:    Color, Urine AMBER (*)    APPearance CLOUDY (*)    All other components within normal limits  CBG MONITORING, ED    Imaging Review No results found. I have personally reviewed and evaluated these images and lab results as part of my medical decision-making.   EKG Interpretation   Date/Time:  Tuesday March 08 2016 12:09:45 EDT Ventricular Rate:  60 PR Interval:  200 QRS Duration: 82 QT Interval:  432 QTC Calculation: 432 R Axis:   60 Text Interpretation:  Normal sinus rhythm Normal ECG No significant change  was found Confirmed by CAMPOS  MD, KEVIN ([id number]) on 03/08/2016 2:28:49 PM      MDM   Final diagnoses:  None    Patient without orthostatic vs.  She is very dizzy and ataxic with a negative Head CT. She will need an MRI to r/o stroke.    Patient awaiting MRI. Labs without sig. Abnormality. i have given Report to PA Boaz who will assume care of the patient.  Margarita Mail, PA-C 03/08/16 2032  Jola Schmidt, MD 03/09/16 [phone]

## 2016-03-08 NOTE — Discharge Instructions (Signed)
Benign Positional Vertigo Vertigo is the feeling that you or your surroundings are moving when they are not. Benign positional vertigo is the most common form of vertigo. The cause of this condition is not serious (is benign). This condition is triggered by certain movements and positions (is positional). This condition can be dangerous if it occurs while you are doing something that could endanger you or others, such as driving.  CAUSES In many cases, the cause of this condition is not known. It may be caused by a disturbance in an area of the inner ear that helps your brain to sense movement and balance. This disturbance can be caused by a viral infection (labyrinthitis), head injury, or repetitive motion. RISK FACTORS This condition is more likely to develop in:  Women.  People who are 50 years of age or older. SYMPTOMS Symptoms of this condition usually happen when you move your head or your eyes in different directions. Symptoms may start suddenly, and they usually last for less than a minute. Symptoms may include:  Loss of balance and falling.  Feeling like you are spinning or moving.  Feeling like your surroundings are spinning or moving.  Nausea and vomiting.  Blurred vision.  Dizziness.  Involuntary eye movement (nystagmus). Symptoms can be mild and cause only slight annoyance, or they can be severe and interfere with daily life. Episodes of benign positional vertigo may return (recur) over time, and they may be triggered by certain movements. Symptoms may improve over time. DIAGNOSIS This condition is usually diagnosed by medical history and a physical exam of the head, neck, and ears. You may be referred to a health care provider who specializes in ear, nose, and throat (ENT) problems (otolaryngologist) or a provider who specializes in disorders of the nervous system (neurologist). You may have additional testing, including:  MRI.  A CT scan.  Eye movement tests. Your  health care provider may ask you to change positions quickly while he or she watches you for symptoms of benign positional vertigo, such as nystagmus. Eye movement may be tested with an electronystagmogram (ENG), caloric stimulation, the Dix-Hallpike test, or the roll test.  An electroencephalogram (EEG). This records electrical activity in your brain.  Hearing tests. TREATMENT Usually, your health care provider will treat this by moving your head in specific positions to adjust your inner ear back to normal. Surgery may be needed in severe cases, but this is rare. In some cases, benign positional vertigo may resolve on its own in 2-4 weeks. HOME CARE INSTRUCTIONS Safety  Move slowly.Avoid sudden body or head movements.  Avoid driving.  Avoid operating heavy machinery.  Avoid doing any tasks that would be dangerous to you or others if a vertigo episode would occur.  If you have trouble walking or keeping your balance, try using a cane for stability. If you feel dizzy or unstable, sit down right away.  Return to your normal activities as told by your health care provider. Ask your health care provider what activities are safe for you. General Instructions  Take over-the-counter and prescription medicines only as told by your health care provider.  Avoid certain positions or movements as told by your health care provider.  Drink enough fluid to keep your urine clear or pale yellow.  Keep all follow-up visits as told by your health care provider. This is important. SEEK MEDICAL CARE IF:  You have a fever.  Your condition gets worse or you develop new symptoms.  Your family or friends   notice any behavioral changes.  Your nausea or vomiting gets worse.  You have numbness or a "pins and needles" sensation. SEEK IMMEDIATE MEDICAL CARE IF:  You have difficulty speaking or moving.  You are always dizzy.  You faint.  You develop severe headaches.  You have weakness in your  legs or arms.  You have changes in your hearing or vision.  You develop a stiff neck.  You develop sensitivity to light.   This information is not intended to replace advice given to you by your health care provider. Make sure you discuss any questions you have with your health care provider.   Document Released: 06/27/2006 Document Revised: 06/10/2015 Document Reviewed: 01/12/2015 Elsevier Interactive Patient Education 2016 [employer]  

## 2016-03-18 ENCOUNTER — Other Ambulatory Visit (HOSPITAL_BASED_OUTPATIENT_CLINIC_OR_DEPARTMENT_OTHER): Payer: Medicare Other

## 2016-03-18 ENCOUNTER — Ambulatory Visit: Payer: Medicare Other

## 2016-03-18 DIAGNOSIS — D469 Myelodysplastic syndrome, unspecified: Secondary | ICD-10-CM

## 2016-03-18 LAB — CBC WITH DIFFERENTIAL/PLATELET
BASO%: 0.6 % (ref 0.0–2.0)
BASOS ABS: 0 10*3/uL (ref 0.0–0.1)
EOS ABS: 0.1 10*3/uL (ref 0.0–0.5)
EOS%: 2.6 % (ref 0.0–7.0)
HCT: 34.6 % — ABNORMAL LOW (ref 34.8–46.6)
HGB: 11.3 g/dL — ABNORMAL LOW (ref 11.6–15.9)
LYMPH%: 39.3 % (ref 14.0–49.7)
MCH: 27.9 pg (ref 25.1–34.0)
MCHC: 32.7 g/dL (ref 31.5–36.0)
MCV: 85.4 fL (ref 79.5–101.0)
MONO#: 0.3 10*3/uL (ref 0.1–0.9)
MONO%: 9.9 % (ref 0.0–14.0)
NEUT#: 1.6 10*3/uL (ref 1.5–6.5)
NEUT%: 47.6 % (ref 38.4–76.8)
PLATELETS: 105 10*3/uL — AB (ref 145–400)
RBC: 4.06 10*6/uL (ref 3.70–5.45)
RDW: 18.4 % — ABNORMAL HIGH (ref 11.2–14.5)
WBC: 3.3 10*3/uL — ABNORMAL LOW (ref 3.9–10.3)
lymph#: 1.3 10*3/uL (ref 0.9–3.3)

## 2016-03-18 NOTE — Progress Notes (Signed)
Hgb today is 11.3, injection held per parameter orders. Pt given current copy of schedule, lab results, and instructed to keep current schedule and to call if issues occur.

## 2016-03-23 ENCOUNTER — Other Ambulatory Visit: Payer: Self-pay | Admitting: Physician Assistant

## 2016-04-08 ENCOUNTER — Other Ambulatory Visit (HOSPITAL_BASED_OUTPATIENT_CLINIC_OR_DEPARTMENT_OTHER): Payer: Medicare Other

## 2016-04-08 ENCOUNTER — Ambulatory Visit (HOSPITAL_BASED_OUTPATIENT_CLINIC_OR_DEPARTMENT_OTHER): Payer: Medicare Other

## 2016-04-08 VITALS — BP 121/72 | HR 64 | Temp 97.7°F | Resp 20

## 2016-04-08 DIAGNOSIS — D469 Myelodysplastic syndrome, unspecified: Secondary | ICD-10-CM | POA: Diagnosis present

## 2016-04-08 DIAGNOSIS — D649 Anemia, unspecified: Secondary | ICD-10-CM

## 2016-04-08 LAB — CBC WITH DIFFERENTIAL/PLATELET
BASO%: 0.2 % (ref 0.0–2.0)
BASOS ABS: 0 10*3/uL (ref 0.0–0.1)
EOS%: 1.2 % (ref 0.0–7.0)
Eosinophils Absolute: 0.1 10*3/uL (ref 0.0–0.5)
HCT: 29 % — ABNORMAL LOW (ref 34.8–46.6)
HGB: 9.9 g/dL — ABNORMAL LOW (ref 11.6–15.9)
LYMPH#: 1.5 10*3/uL (ref 0.9–3.3)
LYMPH%: 27.2 % (ref 14.0–49.7)
MCH: 28.4 pg (ref 25.1–34.0)
MCHC: 34.1 g/dL (ref 31.5–36.0)
MCV: 83.1 fL (ref 79.5–101.0)
MONO#: 0.5 10*3/uL (ref 0.1–0.9)
MONO%: 9.4 % (ref 0.0–14.0)
NEUT#: 3.5 10*3/uL (ref 1.5–6.5)
NEUT%: 62 % (ref 38.4–76.8)
Platelets: 146 10*3/uL (ref 145–400)
RBC: 3.49 10*6/uL — AB (ref 3.70–5.45)
RDW: 15.9 % — AB (ref 11.2–14.5)
WBC: 5.6 10*3/uL (ref 3.9–10.3)
nRBC: 0 % (ref 0–0)

## 2016-04-08 MED ORDER — DARBEPOETIN ALFA 300 MCG/0.6ML IJ SOSY
300.0000 ug | PREFILLED_SYRINGE | INTRAMUSCULAR | Status: DC
  Administered 2016-04-08: 300 ug via SUBCUTANEOUS
  Filled 2016-04-08: qty 0.6

## 2016-04-08 NOTE — Patient Instructions (Signed)
Darbepoetin Alfa injection What is this medicine? DARBEPOETIN ALFA (dar be POE e tin AL fa) helps your body make more red blood cells. It is used to treat anemia caused by chronic kidney failure and chemotherapy. This medicine may be used for other purposes; ask your health care provider or pharmacist if you have questions. COMMON BRAND NAME(S): Aranesp What should I tell my health care provider before I take this medicine? They need to know if you have any of these conditions: -blood clotting disorders or history of blood clots -cancer patient not on chemotherapy -cystic fibrosis -heart disease, such as angina, heart failure, or a history of a heart attack -hemoglobin level of 12 g/dL or greater -high blood pressure -low levels of folate, iron, or vitamin B12 -seizures -an unusual or allergic reaction to darbepoetin, erythropoietin, albumin, hamster proteins, latex, other medicines, foods, dyes, or preservatives -pregnant or trying to get pregnant -breast-feeding How should I use this medicine? This medicine is for injection into a vein or under the skin. It is usually given by a health care professional in a hospital or clinic setting. If you get this medicine at home, you will be taught how to prepare and give this medicine. Do not shake the solution before you withdraw a dose. Use exactly as directed. Take your medicine at regular intervals. Do not take your medicine more often than directed. It is important that you put your used needles and syringes in a special sharps container. Do not put them in a trash can. If you do not have a sharps container, call your pharmacist or healthcare provider to get one. Talk to your pediatrician regarding the use of this medicine in children. While this medicine may be used in children as young as 1 year for selected conditions, precautions do apply. Overdosage: If you think you have taken too much of this medicine contact a poison control center or  emergency room at once. NOTE: This medicine is only for you. Do not share this medicine with others. What if I miss a dose? If you miss a dose, take it as soon as you can. If it is almost time for your next dose, take only that dose. Do not take double or extra doses. What may interact with this medicine? Do not take this medicine with any of the following medications: -epoetin alfa This list may not describe all possible interactions. Give your health care provider a list of all the medicines, herbs, non-prescription drugs, or dietary supplements you use. Also tell them if you smoke, drink alcohol, or use illegal drugs. Some items may interact with your medicine. What should I watch for while using this medicine? Visit your prescriber or health care professional for regular checks on your progress and for the needed blood tests and blood pressure measurements. It is especially important for the doctor to make sure your hemoglobin level is in the desired range, to limit the risk of potential side effects and to give you the best benefit. Keep all appointments for any recommended tests. Check your blood pressure as directed. Ask your doctor what your blood pressure should be and when you should contact him or her. As your body makes more red blood cells, you may need to take iron, folic acid, or vitamin B supplements. Ask your doctor or health care provider which products are right for you. If you have kidney disease continue dietary restrictions, even though this medication can make you feel better. Talk with your doctor or health   care professional about the foods you eat and the vitamins that you take. What side effects may I notice from receiving this medicine? Side effects that you should report to your doctor or health care professional as soon as possible: -allergic reactions like skin rash, itching or hives, swelling of the face, lips, or tongue -breathing problems -changes in vision -chest  pain -confusion, trouble speaking or understanding -feeling faint or lightheaded, falls -high blood pressure -muscle aches or pains -pain, swelling, warmth in the leg -rapid weight gain -severe headaches -sudden numbness or weakness of the face, arm or leg -trouble walking, dizziness, loss of balance or coordination -seizures (convulsions) -swelling of the ankles, feet, hands -unusually weak or tired Side effects that usually do not require medical attention (report to your doctor or health care professional if they continue or are bothersome): -diarrhea -fever, chills (flu-like symptoms) -headaches -nausea, vomiting -redness, stinging, or swelling at site where injected This list may not describe all possible side effects. Call your doctor for medical advice about side effects. You may report side effects to FDA at 1-800-FDA-1088. Where should I keep my medicine? Keep out of the reach of children. Store in a refrigerator between 2 and 8 degrees C (36 and 46 degrees F). Do not freeze. Do not shake. Throw away any unused portion if using a single-dose vial. Throw away any unused medicine after the expiration date. NOTE: This sheet is a summary. It may not cover all possible information. If you have questions about this medicine, talk to your doctor, pharmacist, or health care provider.  2015, [employer]. (2008-09-02 10:23:57)  

## 2016-04-11 ENCOUNTER — Ambulatory Visit (INDEPENDENT_AMBULATORY_CARE_PROVIDER_SITE_OTHER): Payer: Medicare Other | Admitting: Internal Medicine

## 2016-04-11 ENCOUNTER — Encounter: Payer: Self-pay | Admitting: Internal Medicine

## 2016-04-11 VITALS — BP 116/74 | HR 64 | Temp 97.6°F | Resp 16 | Ht 62.0 in | Wt 165.6 lb

## 2016-04-11 DIAGNOSIS — Z79899 Other long term (current) drug therapy: Secondary | ICD-10-CM | POA: Diagnosis not present

## 2016-04-11 DIAGNOSIS — Z1212 Encounter for screening for malignant neoplasm of rectum: Secondary | ICD-10-CM

## 2016-04-11 DIAGNOSIS — E039 Hypothyroidism, unspecified: Secondary | ICD-10-CM | POA: Diagnosis not present

## 2016-04-11 DIAGNOSIS — I10 Essential (primary) hypertension: Secondary | ICD-10-CM

## 2016-04-11 DIAGNOSIS — E559 Vitamin D deficiency, unspecified: Secondary | ICD-10-CM | POA: Diagnosis not present

## 2016-04-11 DIAGNOSIS — S96911A Strain of unspecified muscle and tendon at ankle and foot level, right foot, initial encounter: Secondary | ICD-10-CM

## 2016-04-11 DIAGNOSIS — R7303 Prediabetes: Secondary | ICD-10-CM | POA: Diagnosis not present

## 2016-04-11 DIAGNOSIS — K219 Gastro-esophageal reflux disease without esophagitis: Secondary | ICD-10-CM | POA: Diagnosis not present

## 2016-04-11 DIAGNOSIS — D631 Anemia in chronic kidney disease: Secondary | ICD-10-CM

## 2016-04-11 DIAGNOSIS — Z136 Encounter for screening for cardiovascular disorders: Secondary | ICD-10-CM | POA: Diagnosis not present

## 2016-04-11 DIAGNOSIS — N183 Chronic kidney disease, stage 3 unspecified: Secondary | ICD-10-CM

## 2016-04-11 DIAGNOSIS — E782 Mixed hyperlipidemia: Secondary | ICD-10-CM | POA: Diagnosis not present

## 2016-04-11 DIAGNOSIS — N189 Chronic kidney disease, unspecified: Secondary | ICD-10-CM

## 2016-04-11 LAB — LIPID PANEL
CHOL/HDL RATIO: 2.7 ratio (ref <=5.0)
Cholesterol: 194 mg/dL (ref 125–200)
HDL: 71 mg/dL (ref >=46)
LDL CALC: 105 mg/dL (ref <130)
Triglycerides: 88 mg/dL (ref <150)
VLDL: 18 mg/dL (ref <30)

## 2016-04-11 LAB — CBC WITH DIFFERENTIAL/PLATELET
BASOS PCT: 0 %
Basophils Absolute: 0 cells/uL (ref 0–200)
Eosinophils Absolute: 68 cells/uL (ref 15–500)
Eosinophils Relative: 2 %
HEMATOCRIT: 30.7 % — AB (ref 35.0–45.0)
HEMOGLOBIN: 10 g/dL — AB (ref 11.7–15.5)
LYMPHS ABS: 1360 {cells}/uL (ref 850–3900)
LYMPHS PCT: 40 %
MCH: 27.9 pg (ref 27.0–33.0)
MCHC: 32.6 g/dL (ref 32.0–36.0)
MCV: 85.8 fL (ref 80.0–100.0)
MONO ABS: 272 {cells}/uL (ref 200–950)
Monocytes Relative: 8 %
NEUTROS ABS: 1700 {cells}/uL (ref 1500–7800)
Neutrophils Relative %: 50 %
Platelets: 172 10*3/uL (ref 140–400)
RBC: 3.58 MIL/uL — AB (ref 3.80–5.10)
RDW: 16.1 % — AB (ref 11.0–15.0)
WBC: 3.4 10*3/uL — AB (ref 3.8–10.8)

## 2016-04-11 LAB — HEMOGLOBIN A1C
HEMOGLOBIN A1C: 4.7 % (ref <5.7)
Mean Plasma Glucose: 88 mg/dL

## 2016-04-11 LAB — HEPATIC FUNCTION PANEL
ALK PHOS: 99 U/L (ref 33–130)
ALT: 8 U/L (ref 6–29)
AST: 17 U/L (ref 10–35)
Albumin: 4.5 g/dL (ref 3.6–5.1)
BILIRUBIN DIRECT: 0.1 mg/dL (ref <=0.2)
BILIRUBIN INDIRECT: 0.5 mg/dL (ref 0.2–1.2)
Total Bilirubin: 0.6 mg/dL (ref 0.2–1.2)
Total Protein: 7.3 g/dL (ref 6.1–8.1)

## 2016-04-11 LAB — TSH: TSH: 2.04 m[IU]/L

## 2016-04-11 LAB — MAGNESIUM: MAGNESIUM: 1.8 mg/dL (ref 1.5–2.5)

## 2016-04-11 MED ORDER — PREDNISONE 20 MG PO TABS: ORAL_TABLET | ORAL | Status: DC

## 2016-04-11 NOTE — Patient Instructions (Signed)
Recommend Adult Low Dose Aspirin or   coated  Aspirin 81 mg daily   To reduce risk of Colon Cancer 20 %,   Skin Cancer 26 % ,   Melanoma 46%   and   Pancreatic cancer 60%   ++++++++++++++++++++++++++++++++++++++++++++++++++++++ Vitamin D goal   is between 70-100.   Please make sure that you are taking your Vitamin D as directed.   It is very important as a natural anti-inflammatory   helping hair, skin, and nails, as well as reducing stroke and heart attack risk.   It helps your bones and helps with mood.  It also decreases numerous cancer risks so please take it as directed.   Low Vit D is associated with a 200-300% higher risk for CANCER   and 200-300% higher risk for HEART   ATTACK  &  STROKE.   .....................................[name]  It is also associated with higher death rate at younger ages,   autoimmune diseases like Rheumatoid arthritis, Lupus, Multiple Sclerosis.     Also many other serious conditions, like depression, Alzheimer's  Dementia, infertility, muscle aches, fatigue, fibromyalgia - just to name a few.  ++++++++++++++++++++++++++++++++++++++++++++++++  Recommend the book "The END of DIETING" by Dr Excell Seltzer   & the book "The END of DIABETES " by Dr Excell Seltzer  At [email or web address] - get book & Audio CD's     Being diabetic has a  300% increased risk for heart attack, stroke, cancer, and alzheimer- type vascular dementia. It is very important that you work harder with diet by avoiding all foods that are white. Avoid white rice (brown & wild rice is OK), white potatoes (sweetpotatoes in moderation is OK), White bread or wheat bread or anything made out of white flour like bagels, donuts, rolls, buns, biscuits, cakes, pastries, cookies, pizza crust, and pasta (made from white flour & egg whites) - vegetarian pasta or spinach or wheat pasta is OK. Multigrain breads like Arnold's or Pepperidge [employer], or multigrain sandwich thins or flatbreads.  Diet,  exercise and weight loss can reverse and cure diabetes in the early stages.  Diet, exercise and weight loss is very important in the control and prevention of complications of diabetes which affects every system in your body, ie. Brain - dementia/stroke, eyes - glaucoma/blindness, heart - heart attack/heart failure, kidneys - dialysis, stomach - gastric paralysis, intestines - malabsorption, nerves - severe painful neuritis, circulation - gangrene & loss of a leg(s), and finally cancer and Alzheimers.    I recommend avoid fried & greasy foods,  sweets/candy, white rice (brown or wild rice or Quinoa is OK), white potatoes (sweet potatoes are OK) - anything made from white flour - bagels, doughnuts, rolls, buns, biscuits,white and wheat breads, pizza crust and traditional pasta made of white flour & egg white(vegetarian pasta or spinach or wheat pasta is OK).  Multi-grain bread is OK - like multi-grain flat bread or sandwich thins. Avoid alcohol in excess. Exercise is also important.    Eat all the vegetables you want - avoid meat, especially red meat and dairy - especially cheese.  Cheese is the most concentrated form of trans-fats which is the worst thing to clog up our arteries. Veggie cheese is OK which can be found in the fresh produce section at [employer] or [employer] or [employer]  ++++++++++++++++++++++++++++++++++++++++++++++++++ DASH Eating Plan  DASH stands for "Dietary Approaches to Stop Hypertension."   The DASH eating plan is a healthy eating plan that has been shown to reduce high blood  pressure (hypertension). Additional health benefits may include reducing the risk of type 2 diabetes mellitus, heart disease, and stroke. The DASH eating plan may also help with weight loss.  WHAT DO I NEED TO KNOW ABOUT THE DASH EATING PLAN?  For the DASH eating plan, you will follow these general guidelines:  Choose foods with a percent daily value for sodium of less than 5% (as listed on the food  label).  Use salt-free seasonings or herbs instead of table salt or sea salt.  Check with your health care provider or pharmacist before using salt substitutes.  Eat lower-sodium products, often labeled as "lower sodium" or "no salt added."  Eat fresh foods.  Eat more vegetables, fruits, and low-fat dairy products.    Choose whole grains. Look for the word "whole" as the first word in the ingredient list.  Choose fish   Limit sweets, desserts, sugars, and sugary drinks.  Choose heart-healthy fats.  Eat veggie cheese   Eat more home-cooked food and less restaurant, buffet, and fast food.  Limit fried foods.  [place]uffaker foods using methods other than frying.  Limit canned vegetables. If you do use them, rinse them well to decrease the sodium.  When eating at a restaurant, ask that your food be prepared with less salt, or no salt if possible.                      WHAT FOODS CAN I EAT?  Read Dr Fara Olden Fuhrman's books on The End of Dieting & The End of Diabetes  Grains  Whole grain or whole wheat bread. Brown rice. Whole grain or whole wheat pasta. Quinoa, bulgur, and whole grain cereals. Low-sodium cereals. Corn or whole wheat flour tortillas. Whole grain cornbread. Whole grain crackers. Low-sodium crackers.  Vegetables  Fresh or frozen vegetables (raw, steamed, roasted, or grilled). Low-sodium or reduced-sodium tomato and vegetable juices. Low-sodium or reduced-sodium tomato sauce and paste. Low-sodium or reduced-sodium canned vegetables.   Fruits  All fresh, canned (in natural juice), or frozen fruits.  Protein Products   All fish and seafood.  Dried beans, peas, or lentils. Unsalted nuts and seeds. Unsalted canned beans.  Dairy  Low-fat dairy products, such as skim or 1% milk, 2% or reduced-fat cheeses, low-fat ricotta or cottage cheese, or plain low-fat yogurt. Low-sodium or reduced-sodium cheeses.  Fats and Oils  Tub margarines without trans fats. Light or  reduced-fat mayonnaise and salad dressings (reduced sodium). Avocado. Safflower, olive, or canola oils. Natural peanut or almond butter.  Other  Unsalted popcorn and pretzels. The items listed above may not be a complete list of recommended foods or beverages. Contact your dietitian for more options.  +++++++++++++++++++++++++++++++++++++++++++  WHAT FOODS ARE NOT RECOMMENDED?  Grains/ White flour or wheat flour  White bread. White pasta. White rice. Refined cornbread. Bagels and croissants. Crackers that contain trans fat.  Vegetables  Creamed or fried vegetables. Vegetables in a . Regular canned vegetables. Regular canned tomato sauce and paste. Regular tomato and vegetable juices.  Fruits  Dried fruits. Canned fruit in light or heavy syrup. Fruit juice.  Meat and Other Protein Products  Meat in general - RED mwaet & White meat.  Fatty cuts of meat. Ribs, chicken wings, bacon, sausage, bologna, salami, chitterlings, fatback, hot dogs, bratwurst, and packaged luncheon meats.  Dairy  Whole or 2% milk, cream, half-and-half, and cream cheese. Whole-fat or sweetened yogurt. Full-fat cheeses or blue cheese. Nondairy creamers and whipped toppings. Processed cheese, cheese spreads, or  cheese curds.  Condiments  Onion and garlic salt, seasoned salt, table salt, and sea salt. Canned and packaged gravies. Worcestershire sauce. Tartar sauce. Barbecue sauce. Teriyaki sauce. Soy sauce, including reduced sodium. Steak sauce. Fish sauce. Oyster sauce. Cocktail sauce. Horseradish. Ketchup and mustard. Meat flavorings and tenderizers. Bouillon cubes. Hot sauce. Tabasco sauce. Marinades. Taco seasonings. Relishes.  Fats and Oils Butter, stick margarine, lard, shortening and bacon fat. Coconut, palm kernel, or palm oils. Regular salad dressings.  Pickles and olives. Salted popcorn and pretzels.  The items listed above may not be a complete list of foods and beverages to avoid.   Preventive  Care for Adults  A healthy lifestyle and preventive care can promote health and wellness. Preventive health guidelines for women include the following key practices.  A routine yearly physical is a good way to check with your health care provider about your health and preventive screening. It is a chance to share any concerns and updates on your health and to receive a thorough exam.  Visit your dentist for a routine exam and preventive care every 6 months. Brush your teeth twice a day and floss once a day. Good oral hygiene prevents tooth decay and gum disease.  The frequency of eye exams is based on your age, health, family medical history, use of contact lenses, and other factors. Follow your health care provider's recommendations for frequency of eye exams.  Eat a healthy diet. Foods like vegetables, fruits, whole grains, low-fat dairy products, and lean protein foods contain the nutrients you need without too many calories. Decrease your intake of foods high in solid fats, added sugars, and salt. Eat the right amount of calories for you.Get information about a proper diet from your health care provider, if necessary.  Regular physical exercise is one of the most important things you can do for your health. Most adults should get at least 150 minutes of moderate-intensity exercise (any activity that increases your heart rate and causes you to sweat) each week. In addition, most adults need muscle-strengthening exercises on 2 or more days a week.  Maintain a healthy weight. The body mass index (BMI) is a screening tool to identify possible weight problems. It provides an estimate of body fat based on height and weight. Your health care provider can find your BMI and can help you achieve or maintain a healthy weight.For adults 20 years and older:  A BMI below 18.5 is considered underweight.  A BMI of 18.5 to 24.9 is normal.  A BMI of 25 to 29.9 is considered overweight.  A BMI of 30 and  above is considered obese.  Maintain normal blood lipids and cholesterol levels by exercising and minimizing your intake of saturated fat. Eat a balanced diet with plenty of fruit and vegetables. If your lipid or cholesterol levels are high, you are over 50, or you are at high risk for heart disease, you may need your cholesterol levels checked more frequently.Ongoing high lipid and cholesterol levels should be treated with medicines if diet and exercise are not working.  If you smoke, find out from your health care provider how to quit. If you do not use tobacco, do not start.  Lung cancer screening is recommended for adults aged 55-80 years who are at high risk for developing lung cancer because of a history of smoking. A yearly low-dose CT scan of the lungs is recommended for people who have at least a 30-pack-year history of smoking and are a current smoker or   have quit within the past 15 years. A pack year of smoking is smoking an average of 1 pack of cigarettes a day for 1 year (for example: 1 pack a day for 30 years or 2 packs a day for 15 years). Yearly screening should continue until the smoker has stopped smoking for at least 15 years. Yearly screening should be stopped for people who develop a health problem that would prevent them from having lung cancer treatment.  Avoid use of street drugs. Do not share needles with anyone. Ask for help if you need support or instructions about stopping the use of drugs.  High blood pressure causes heart disease and increases the risk of stroke.  Ongoing high blood pressure should be treated with medicines if weight loss and exercise do not work.  If you are 55-79 years old, ask your health care provider if you should take aspirin to prevent strokes.  Diabetes screening involves taking a blood sample to check your fasting blood sugar level. This should be done once every 3 years, after age 45, if you are within normal weight and without risk factors for  diabetes. Testing should be considered at a younger age or be carried out more frequently if you are overweight and have at least 1 risk factor for diabetes.  Breast cancer screening is essential preventive care for women. You should practice "breast self-awareness." This means understanding the normal appearance and feel of your breasts and may include breast self-examination. Any changes detected, no matter how small, should be reported to a health care provider. Women in their 20s and 30s should have a clinical breast exam (CBE) by a health care provider as part of a regular health exam every 1 to 3 years. After age 40, women should have a CBE every year. Starting at age 40, women should consider having a mammogram (breast X-ray test) every year. Women who have a family history of breast cancer should talk to their health care provider about genetic screening. Women at a high risk of breast cancer should talk to their health care providers about having an MRI and a mammogram every year.  Breast cancer gene (BRCA)-related cancer risk assessment is recommended for women who have family members with BRCA-related cancers. BRCA-related cancers include breast, ovarian, tubal, and peritoneal cancers. Having family members with these cancers may be associated with an increased risk for harmful changes (mutations) in the breast cancer genes BRCA1 and BRCA2. Results of the assessment will determine the need for genetic counseling and BRCA1 and BRCA2 testing.  Routine pelvic exams to screen for cancer are no longer recommended for nonpregnant women who are considered low risk for cancer of the pelvic organs (ovaries, uterus, and vagina) and who do not have symptoms. Ask your health care provider if a screening pelvic exam is right for you.  If you have had past treatment for cervical cancer or a condition that could lead to cancer, you need Pap tests and screening for cancer for at least 20 years after your  treatment. If Pap tests have been discontinued, your risk factors (such as having a new sexual partner) need to be reassessed to determine if screening should be resumed. Some women have medical problems that increase the chance of getting cervical cancer. In these cases, your health care provider may recommend more frequent screening and Pap tests.    Colorectal cancer can be detected and often prevented. Most routine colorectal cancer screening begins at the age of 50 years and   continues through age 75 years. However, your health care provider may recommend screening at an earlier age if you have risk factors for colon cancer. On a yearly basis, your health care provider may provide home test kits to check for hidden blood in the stool. Use of a small camera at the end of a tube, to directly examine the colon (sigmoidoscopy or colonoscopy), can detect the earliest forms of colorectal cancer. Talk to your health care provider about this at age 50, when routine screening begins. Direct exam of the colon should be repeated every 5-10 years through age 75 years, unless early forms of pre-cancerous polyps or small growths are found.  Osteoporosis is a disease in which the bones lose minerals and strength with aging. This can result in serious bone fractures or breaks. The risk of osteoporosis can be identified using a bone density scan. Women ages 65 years and over and women at risk for fractures or osteoporosis should discuss screening with their health care providers. Ask your health care provider whether you should take a calcium supplement or vitamin D to reduce the rate of osteoporosis.  Menopause can be associated with physical symptoms and risks. Hormone replacement therapy is available to decrease symptoms and risks. You should talk to your health care provider about whether hormone replacement therapy is right for you.  Use sunscreen. Apply sunscreen liberally and repeatedly throughout the day. You  should seek shade when your shadow is shorter than you. Protect yourself by wearing long sleeves, pants, a wide-brimmed hat, and sunglasses year round, whenever you are outdoors.  Once a month, do a whole body skin exam, using a mirror to look at the skin on your back. Tell your health care provider of new moles, moles that have irregular borders, moles that are larger than a pencil eraser, or moles that have changed in shape or color.  Stay current with required vaccines (immunizations).  Influenza vaccine. All adults should be immunized every year.  Tetanus, diphtheria, and acellular pertussis (Td, Tdap) vaccine. Pregnant women should receive 1 dose of Tdap vaccine during each pregnancy. The dose should be obtained regardless of the length of time since the last dose. Immunization is preferred during the 27th-36th week of gestation. An adult who has not previously received Tdap or who does not know her vaccine status should receive 1 dose of Tdap. This initial dose should be followed by tetanus and diphtheria toxoids (Td) booster doses every 10 years. Adults with an unknown or incomplete history of completing a 3-dose immunization series with Td-containing vaccines should begin or complete a primary immunization series including a Tdap dose. Adults should receive a Td booster every 10 years.    Zoster vaccine. One dose is recommended for adults aged 60 years or older unless certain conditions are present.    Pneumococcal 13-valent conjugate (PCV13) vaccine. When indicated, a person who is uncertain of her immunization history and has no record of immunization should receive the PCV13 vaccine. An adult aged 19 years or older who has certain medical conditions and has not been previously immunized should receive 1 dose of PCV13 vaccine. This PCV13 should be followed with a dose of pneumococcal polysaccharide (PPSV23) vaccine. The PPSV23 vaccine dose should be obtained at least 8 weeks after the dose  of PCV13 vaccine. An adult aged 19 years or older who has certain medical conditions and previously received 1 or more doses of PPSV23 vaccine should receive 1 dose of PCV13. The PCV13 vaccine dose should   be obtained 1 or more years after the last PPSV23 vaccine dose.    Pneumococcal polysaccharide (PPSV23) vaccine. When PCV13 is also indicated, PCV13 should be obtained first. All adults aged 65 years and older should be immunized. An adult younger than age 65 years who has certain medical conditions should be immunized. Any person who resides in a nursing home or long-term care facility should be immunized. An adult smoker should be immunized. People with an immunocompromised condition and certain other conditions should receive both PCV13 and PPSV23 vaccines. People with human immunodeficiency virus (HIV) infection should be immunized as soon as possible after diagnosis. Immunization during chemotherapy or radiation therapy should be avoided. Routine use of PPSV23 vaccine is not recommended for American Indians, [place] Natives, or people younger than 65 years unless there are medical conditions that require PPSV23 vaccine. When indicated, people who have unknown immunization and have no record of immunization should receive PPSV23 vaccine. One-time revaccination 5 years after the first dose of PPSV23 is recommended for people aged 19-64 years who have chronic kidney failure, nephrotic syndrome, asplenia, or immunocompromised conditions. People who received 1-2 doses of PPSV23 before age 65 years should receive another dose of PPSV23 vaccine at age 65 years or later if at least 5 years have passed since the previous dose. Doses of PPSV23 are not needed for people immunized with PPSV23 at or after age 65 years.   Preventive Services / Frequency  Ages 65 years and over  Blood pressure check.  Lipid and cholesterol check.  Lung cancer screening. / Every year if you are aged 55-80 years and have a  30-pack-year history of smoking and currently smoke or have quit within the past 15 years. Yearly screening is stopped once you have quit smoking for at least 15 years or develop a health problem that would prevent you from having lung cancer treatment.  Clinical breast exam.** / Every year after age 40 years.  BRCA-related cancer risk assessment.** / For women who have family members with a BRCA-related cancer (breast, ovarian, tubal, or peritoneal cancers).  Mammogram.** / Every year beginning at age 40 years and continuing for as long as you are in good health. Consult with your health care provider.  Pap test.** / Every 3 years starting at age 30 years through age 65 or 70 years with 3 consecutive normal Pap tests. Testing can be stopped between 65 and 70 years with 3 consecutive normal Pap tests and no abnormal Pap or HPV tests in the past 10 years.  Fecal occult blood test (FOBT) of stool. / Every year beginning at age 50 years and continuing until age 75 years. You may not need to do this test if you get a colonoscopy every 10 years.  Flexible sigmoidoscopy or colonoscopy.** / Every 5 years for a flexible sigmoidoscopy or every 10 years for a colonoscopy beginning at age 50 years and continuing until age 75 years.  Hepatitis C blood test.** / For all people born from 1945 through 1965 and any individual with known risks for hepatitis C.  Osteoporosis screening.** / A one-time screening for women ages 65 years and over and women at risk for fractures or osteoporosis.  Skin self-exam. / Monthly.  Influenza vaccine. / Every year.  Tetanus, diphtheria, and acellular pertussis (Tdap/Td) vaccine.** / 1 dose of Td every 10 years.  Zoster vaccine.** / 1 dose for adults aged 60 years or older.  Pneumococcal 13-valent conjugate (PCV13) vaccine.** / Consult your health care provider.    Pneumococcal polysaccharide (PPSV23) vaccine.** / 1 dose for all adults aged 65 years and older. Screening  for abdominal aortic aneurysm (AAA)  by ultrasound is recommended for people who have history of high blood pressure or who are current or former smokers. 

## 2016-04-11 NOTE — Progress Notes (Signed)
Patient ID: Susan [name], female   DOB: [birth year], 75 y.o.   MRN: [id number]  [facility] ADULT & ADOLESCENT INTERNAL MEDICINE                   Unk Pinto, M.D.    Uvaldo Bristle. Silverio Lay, P.A.-C      Starlyn Skeans, P.A.-C   [facility]                [address] [place], [place] [id number] Telephone [phone] Telefax [phone]  ______________________________________________________________________ Comprehensive Evaluation &  Examination     This very nice 75 y.o. WBF presents for a  comprehensive evaluation and management of multiple medical co-morbidities.  Patient has been followed for HTN, T2_NIDDM  Prediabetes, Hyperlipidemia and Vitamin D Deficiency. Patient also has hx of Myelodysplasia known since 1984 and has  been followed Dr Alen Blew on a 3 weekly schedule monitoring H&H for Aranesp/Epogen injection.      HTN predates circa 1980's. Patient's BP has been controlled at home and patient denies any cardiac symptoms as chest pain, palpitations, shortness of breath, dizziness or ankle swelling. Today's BP: 116/74 mmHg      Patient's hyperlipidemia is controlled with diet and patient has been resistant to take meds for cholesterol.  Last lipids were not at goal with elevated T Chol 297, HDL 65, Trig 113 & elevated  LDL Chol  119 in Aug 2016.     Patient has Morbid Obesity (BMI 30+) and is screened proactively for prediabetes predating and patient denies reactive hypoglycemic symptoms, visual blurring, diabetic polys, or paresthesias. Last A1c was 4.7% in May 2016.     Patient has been on Thyroid Replaxcement since 2011 .  Finally, patient has history of Vitamin D Deficiency of "97"  In 2008 and last Vitamin D was 72 in May 2016.  Medication Sig  . amLODipine (NORVASC) 10 MG tablet Take 1/2 to 1 tablet daily for BP (Patient taking differently: Take 5 mg by mouth at bedtime. Take 1/2 to 1 tablet daily for BP)  . aspirin 81 MG  chewable tablet Chew 81 mg by mouth every morning.   [name] azelastine (ASTELIN) 0.1 % nasal spray Place 2 sprays into both nostrils 2 (two) times daily. Use in each nostril as directed  . Cetirizine HCl 10 MG CAPS Take 1 capsule (10 mg total) by mouth at bedtime. (Patient taking differently: Take 10 mg by mouth at bedtime as needed (allergies). )  . Cholecalciferol (VITAMIN D-3) 5000 UNITS TABS Take 10,000 Units by mouth daily.   . Darbepoetin Alfa-Albumin (ARANESP IJ) Inject as directed every 21 ( twenty-one) days.   . diclofenac (VOLTAREN) 75 MG EC tablet TAKE 1 TABLET(75 MG) BY MOUTH TWICE DAILY  . diphenoxylate-atropine (LOMOTIL) 2.5-0.025 MG tablet Take 2 tablets by mouth 4 (four) times daily as needed for diarrhea or loose stools.  [name] ipratropium (ATROVENT) 0.03 % nasal spray Take 1 to 2 sprays into each nostril up to 2 or 3 x day as needed (Patient not taking: Reported on 03/08/2016)  . labetalol (NORMODYNE) 300 MG tablet Take 1 tablet (300 mg total) by mouth 2 (two) times daily.  [name] levothyroxine (SYNTHROID) 100 MCG tablet Take 1 tablet daily on an empty stomach for 30 minutes (Patient taking differently: Take 100 mcg by mouth daily before breakfast. Take an empty stomach for 30 minutes)  . losartan-hydrochlorothiazide (HYZAAR) 100-25  MG per tablet Take 1 tablet daily for BP (Patient taking differently: Take 0.5 tablets by mouth daily. )  . meclizine (ANTIVERT) 12.5 MG tablet Take 1 tablet (12.5 mg total) by mouth 3 (three) times daily as needed for dizziness.  . pantoprazole (PROTONIX) 40 MG tablet TAKE 1 TABLET BY MOUTH EVERY DAY FOR ACID REFLUX   Allergies  Allergen Reactions  . Codeine Anaphylaxis  . Penicillins Anaphylaxis  . Bactrim Itching and Swelling  . Clarithromycin Other (See Comments)    "Caused skin to peel off my hand."  . Flagyl [Metronidazole Hcl] Itching and Swelling  . Sulfa Antibiotics Itching and Swelling   Past Medical History  Diagnosis Date  . Anemia   . Arthritis    . Myelodysplasia   . Hypertension   . HLD (hyperlipidemia)   . Hypothyroidism   . GERD (gastroesophageal reflux disease)   . Vitamin D deficiency    Health Maintenance  Topic Date Due  . TETANUS/TDAP  07/06/2016 (Originally 10/04/2015)  . DEXA SCAN  07/20/2017 (Originally 07/18/2004)  . ZOSTAVAX  Addressed   Immunization History  Administered Date(s) Administered  . Td 10/03/2005   Past Surgical History  Procedure Laterality Date  . Umbilical hernia repair    . Rotator cuff repair Left   . Inguinal hernia repair    . Carpal tunnel release Right   . Dilation and curettage of uterus     Family History  Problem Relation Age of Onset  . Stroke Father   . Hypertension Father   . Stroke Sister   . Coronary artery disease Brother   . Hypertension Sister   . Bladder Cancer Sister   . Cancer Mother    Social History  Substance Use Topics  . Smoking status: Former Smoker    Types: Cigarettes    Quit date: 10/02/1996  . Smokeless tobacco: Never Used  . Alcohol Use: 0.0 oz/week     Comment: glass of wine at least once a month.    ROS Constitutional: Denies fever, chills, weight loss/gain, headaches, insomnia,  night sweats, and change in appetite. Does c/o fatigue. Eyes: Denies redness, blurred vision, diplopia, discharge, itchy, watery eyes.  ENT: Denies discharge, congestion, post nasal drip, epistaxis, sore throat, earache, hearing loss, dental pain, Tinnitus, Vertigo, Sinus pain, snoring.  Cardio: Denies chest pain, palpitations, irregular heartbeat, syncope, dyspnea, diaphoresis, orthopnea, PND, claudication, edema Respiratory: denies cough, dyspnea, DOE, pleurisy, hoarseness, laryngitis, wheezing.  Gastrointestinal: Denies dysphagia, heartburn, reflux, water brash, pain, cramps, nausea, vomiting, bloating, diarrhea, constipation, hematemesis, melena, hematochezia, jaundice, hemorrhoids Genitourinary: Denies dysuria, frequency, urgency, nocturia, hesitancy, discharge,  hematuria, flank pain Breast: Breast lumps, nipple discharge, bleeding.  Musculoskeletal:  Rewports recent Left ankle pains w/o injury. Denies falls. Skin: Denies puritis, rash, hives, warts, acne, eczema, changing in skin lesion Neuro: No weakness, tremor, incoordination, spasms, paresthesia, pain Psychiatric: Denies confusion, memory loss, sensory loss. Denies Depression. Endocrine: Denies change in weight, skin, hair change, nocturia, and paresthesia, diabetic polys, visual blurring, hyper / hypo glycemic episodes.  Heme/Lymph: No excessive bleeding, bruising, enlarged lymph nodes.  Physical Exam  BP 116/74 mmHg  Pulse 64  Temp(Src) 97.6 F (36.4 C)  Resp 16  Ht 5\' 2"  (1.575 m)  Wt 165 lb 9.6 oz (75.116 kg)  BMI 30.28 kg/m2  General Appearance: Well nourished and in no apparent distress.  Eyes: PERRLA, EOMs, conjunctiva no swelling or erythema, normal fundi and vessels. Sinuses: No frontal/maxillary tenderness ENT/Mouth: EACs patent / TMs  nl. Nares clear without  erythema, swelling, mucoid exudates. Oral hygiene is good. No erythema, swelling, or exudate. Tongue normal, non-obstructing. Tonsils not swollen or erythematous. Hearing normal.  Neck: Supple, thyroid normal. No bruits, nodes or JVD. Respiratory: Respiratory effort normal.  BS equal and clear bilateral without rales, rhonci, wheezing or stridor. Cardio: Heart sounds are normal with regular rate and rhythm and no murmurs, rubs or gallops. Peripheral pulses are normal and equal bilaterally without edema. No aortic or femoral bruits. Chest: symmetric with normal excursions and percussion. Breasts: Symmetric, without lumps, nipple discharge, retractions, or fibrocystic changes.  Abdomen: Flat, soft with bowel sounds active. Nontender, no guarding, rebound, hernias, masses, or organomegaly.  Lymphatics: Non tender without lymphadenopathy.  Genitourinary:  Musculoskeletal: Full ROM all peripheral extremities, joint stability,  5/5 strength, and normal gait. Tender over Lt lateral malleolus about the deltoid ligament. Skin: Warm and dry without rashes, lesions, cyanosis, clubbing or  ecchymosis.  Neuro: Cranial nerves intact, reflexes equal bilaterally. Normal muscle tone, no cerebellar symptoms. Sensation intact.  Pysch: Alert and oriented X 3, normal affect, Insight and Judgment appropriate.   Assessment and Plan  1. Essential hypertension  - Microalbumin / creatinine urine ratio - EKG 12-Lead - [place], RETROPERITNL ABD,  LTD - TSH  2. Hyperlipidemia  - Lipid panel - TSH  3. Prediabetes  - Hemoglobin A1c - Insulin, random  4. Vitamin D deficiency  - VITAMIN D 25 Hydroxy (Vit-D Deficiency, Fractures)  5. CKD, 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)  - Microalbumin / creatinine urine ratio  6. Anemia of renal disease (MDS)   -f/u Dr Alen Blew - CBC with Differential/Platelet  7. Hypothyroidism, unspecified hypothyroidism type  - TSH  8. Gastroesophageal reflux disease, esophagitis presence not specified   9. Screening for rectal cancer  - POC Hemoccult Bld/Stl (3-Cd Home Screen); Future  10. Screening for ischemic heart disease  - EKG 12-Lead  11. Screening for AAA (aortic abdominal aneurysm)  - [place], RETROPERITNL ABD,  LTD  12. Ankle strain, right, initial encounter  - predniSONE (DELTASONE) 20 MG tablet; 1 tab 3 x day for 3 days, then 1 tab 2 x day for 3 days, then 1 tab 1 x day for 5 days  Dispense: 20 tablet; Refill: 0  13. Medication management  - Urinalysis, Routine w reflex microscopic (not at [facility]) - CBC with Differential/Platelet - BASIC METABOLIC PANEL WITH GFR - Hepatic function panel - Magnesium   Continue prudent diet as discussed, weight control, BP monitoring, regular exercise, and medications. Discussed med's effects and SE's. Screening labs and tests as requested with regular follow-up as recommended. Over 40 minutes of exam, counseling, chart review and high complex critical  decision making was performed.

## 2016-04-12 LAB — URINALYSIS, ROUTINE W REFLEX MICROSCOPIC
Bilirubin Urine: NEGATIVE
GLUCOSE, UA: NEGATIVE
HGB URINE DIPSTICK: NEGATIVE
KETONES UR: NEGATIVE
LEUKOCYTES UA: NEGATIVE
NITRITE: NEGATIVE
PH: 5.5 (ref 5.0–8.0)
Specific Gravity, Urine: 1.022 (ref 1.001–1.035)

## 2016-04-12 LAB — MICROALBUMIN / CREATININE URINE RATIO
CREATININE, URINE: 246 mg/dL (ref 20–320)
MICROALB UR: 28.7 mg/dL
MICROALB/CREAT RATIO: 117 ug/mg{creat} — AB (ref <30)

## 2016-04-12 LAB — URINALYSIS, MICROSCOPIC ONLY
Casts: NONE SEEN [LPF]
Crystals: NONE SEEN [HPF]
YEAST: NONE SEEN [HPF]

## 2016-04-12 LAB — VITAMIN D 25 HYDROXY (VIT D DEFICIENCY, FRACTURES): Vit D, 25-Hydroxy: 48 ng/mL (ref 30–100)

## 2016-04-12 LAB — INSULIN, RANDOM: Insulin: 4.9 u[IU]/mL (ref 2.0–19.6)

## 2016-04-19 ENCOUNTER — Other Ambulatory Visit: Payer: Self-pay | Admitting: Internal Medicine

## 2016-04-29 ENCOUNTER — Other Ambulatory Visit (HOSPITAL_BASED_OUTPATIENT_CLINIC_OR_DEPARTMENT_OTHER): Payer: Medicare Other

## 2016-04-29 ENCOUNTER — Ambulatory Visit (HOSPITAL_BASED_OUTPATIENT_CLINIC_OR_DEPARTMENT_OTHER): Payer: Medicare Other

## 2016-04-29 VITALS — BP 134/81 | HR 59 | Temp 97.5°F | Resp 20

## 2016-04-29 DIAGNOSIS — N189 Chronic kidney disease, unspecified: Secondary | ICD-10-CM

## 2016-04-29 DIAGNOSIS — D631 Anemia in chronic kidney disease: Secondary | ICD-10-CM | POA: Diagnosis not present

## 2016-04-29 DIAGNOSIS — D469 Myelodysplastic syndrome, unspecified: Secondary | ICD-10-CM

## 2016-04-29 DIAGNOSIS — D6489 Other specified anemias: Secondary | ICD-10-CM

## 2016-04-29 LAB — CBC WITH DIFFERENTIAL/PLATELET
BASO%: 0.7 % (ref 0.0–2.0)
Basophils Absolute: 0 10*3/uL (ref 0.0–0.1)
EOS ABS: 0.1 10*3/uL (ref 0.0–0.5)
EOS%: 3.4 % (ref 0.0–7.0)
HEMATOCRIT: 32 % — AB (ref 34.8–46.6)
HGB: 10.6 g/dL — ABNORMAL LOW (ref 11.6–15.9)
LYMPH#: 1.3 10*3/uL (ref 0.9–3.3)
LYMPH%: 43 % (ref 14.0–49.7)
MCH: 28.7 pg (ref 25.1–34.0)
MCHC: 33.1 g/dL (ref 31.5–36.0)
MCV: 86.7 fL (ref 79.5–101.0)
MONO#: 0.3 10*3/uL (ref 0.1–0.9)
MONO%: 9.7 % (ref 0.0–14.0)
NEUT#: 1.3 10*3/uL — ABNORMAL LOW (ref 1.5–6.5)
NEUT%: 43.2 % (ref 38.4–76.8)
PLATELETS: 107 10*3/uL — AB (ref 145–400)
RBC: 3.69 10*6/uL — ABNORMAL LOW (ref 3.70–5.45)
RDW: 16.2 % — ABNORMAL HIGH (ref 11.2–14.5)
WBC: 3.1 10*3/uL — ABNORMAL LOW (ref 3.9–10.3)

## 2016-04-29 MED ORDER — DARBEPOETIN ALFA 300 MCG/0.6ML IJ SOSY
300.0000 ug | PREFILLED_SYRINGE | INTRAMUSCULAR | Status: DC
  Administered 2016-04-29: 300 ug via SUBCUTANEOUS
  Filled 2016-04-29: qty 0.6

## 2016-04-29 NOTE — Patient Instructions (Signed)
Darbepoetin Alfa injection What is this medicine? DARBEPOETIN ALFA (dar be POE e tin AL fa) helps your body make more red blood cells. It is used to treat anemia caused by chronic kidney failure and chemotherapy. This medicine may be used for other purposes; ask your health care provider or pharmacist if you have questions. COMMON BRAND NAME(S): Aranesp What should I tell my health care provider before I take this medicine? They need to know if you have any of these conditions: -blood clotting disorders or history of blood clots -cancer patient not on chemotherapy -cystic fibrosis -heart disease, such as angina, heart failure, or a history of a heart attack -hemoglobin level of 12 g/dL or greater -high blood pressure -low levels of folate, iron, or vitamin B12 -seizures -an unusual or allergic reaction to darbepoetin, erythropoietin, albumin, hamster proteins, latex, other medicines, foods, dyes, or preservatives -pregnant or trying to get pregnant -breast-feeding How should I use this medicine? This medicine is for injection into a vein or under the skin. It is usually given by a health care professional in a hospital or clinic setting. If you get this medicine at home, you will be taught how to prepare and give this medicine. Do not shake the solution before you withdraw a dose. Use exactly as directed. Take your medicine at regular intervals. Do not take your medicine more often than directed. It is important that you put your used needles and syringes in a special sharps container. Do not put them in a trash can. If you do not have a sharps container, call your pharmacist or healthcare provider to get one. Talk to your pediatrician regarding the use of this medicine in children. While this medicine may be used in children as young as 1 year for selected conditions, precautions do apply. Overdosage: If you think you have taken too much of this medicine contact a poison control center or  emergency room at once. NOTE: This medicine is only for you. Do not share this medicine with others. What if I miss a dose? If you miss a dose, take it as soon as you can. If it is almost time for your next dose, take only that dose. Do not take double or extra doses. What may interact with this medicine? Do not take this medicine with any of the following medications: -epoetin alfa This list may not describe all possible interactions. Give your health care provider a list of all the medicines, herbs, non-prescription drugs, or dietary supplements you use. Also tell them if you smoke, drink alcohol, or use illegal drugs. Some items may interact with your medicine. What should I watch for while using this medicine? Visit your prescriber or health care professional for regular checks on your progress and for the needed blood tests and blood pressure measurements. It is especially important for the doctor to make sure your hemoglobin level is in the desired range, to limit the risk of potential side effects and to give you the best benefit. Keep all appointments for any recommended tests. Check your blood pressure as directed. Ask your doctor what your blood pressure should be and when you should contact him or her. As your body makes more red blood cells, you may need to take iron, folic acid, or vitamin B supplements. Ask your doctor or health care provider which products are right for you. If you have kidney disease continue dietary restrictions, even though this medication can make you feel better. Talk with your doctor or health   care professional about the foods you eat and the vitamins that you take. What side effects may I notice from receiving this medicine? Side effects that you should report to your doctor or health care professional as soon as possible: -allergic reactions like skin rash, itching or hives, swelling of the face, lips, or tongue -breathing problems -changes in vision -chest  pain -confusion, trouble speaking or understanding -feeling faint or lightheaded, falls -high blood pressure -muscle aches or pains -pain, swelling, warmth in the leg -rapid weight gain -severe headaches -sudden numbness or weakness of the face, arm or leg -trouble walking, dizziness, loss of balance or coordination -seizures (convulsions) -swelling of the ankles, feet, hands -unusually weak or tired Side effects that usually do not require medical attention (report to your doctor or health care professional if they continue or are bothersome): -diarrhea -fever, chills (flu-like symptoms) -headaches -nausea, vomiting -redness, stinging, or swelling at site where injected This list may not describe all possible side effects. Call your doctor for medical advice about side effects. You may report side effects to FDA at 1-800-FDA-1088. Where should I keep my medicine? Keep out of the reach of children. Store in a refrigerator between 2 and 8 degrees C (36 and 46 degrees F). Do not freeze. Do not shake. Throw away any unused portion if using a single-dose vial. Throw away any unused medicine after the expiration date. NOTE: This sheet is a summary. It may not cover all possible information. If you have questions about this medicine, talk to your doctor, pharmacist, or health care provider.  2015, [employer]. (2008-09-02 10:23:57)  

## 2016-05-20 ENCOUNTER — Ambulatory Visit (HOSPITAL_BASED_OUTPATIENT_CLINIC_OR_DEPARTMENT_OTHER): Payer: Medicare Other | Admitting: Oncology

## 2016-05-20 ENCOUNTER — Other Ambulatory Visit (HOSPITAL_BASED_OUTPATIENT_CLINIC_OR_DEPARTMENT_OTHER): Payer: Medicare Other

## 2016-05-20 ENCOUNTER — Telehealth: Payer: Self-pay | Admitting: Oncology

## 2016-05-20 ENCOUNTER — Ambulatory Visit: Payer: Medicare Other

## 2016-05-20 VITALS — BP 120/64 | HR 63 | Temp 97.6°F | Resp 17 | Ht 62.0 in | Wt 166.7 lb

## 2016-05-20 DIAGNOSIS — D469 Myelodysplastic syndrome, unspecified: Secondary | ICD-10-CM

## 2016-05-20 DIAGNOSIS — D631 Anemia in chronic kidney disease: Secondary | ICD-10-CM | POA: Diagnosis not present

## 2016-05-20 DIAGNOSIS — D696 Thrombocytopenia, unspecified: Secondary | ICD-10-CM | POA: Diagnosis not present

## 2016-05-20 DIAGNOSIS — N189 Chronic kidney disease, unspecified: Secondary | ICD-10-CM

## 2016-05-20 DIAGNOSIS — D509 Iron deficiency anemia, unspecified: Secondary | ICD-10-CM

## 2016-05-20 LAB — CBC WITH DIFFERENTIAL/PLATELET
BASO%: 0.3 % (ref 0.0–2.0)
BASOS ABS: 0 10*3/uL (ref 0.0–0.1)
EOS ABS: 0.1 10*3/uL (ref 0.0–0.5)
EOS%: 2.2 % (ref 0.0–7.0)
HCT: 34.5 % — ABNORMAL LOW (ref 34.8–46.6)
HGB: 11.8 g/dL (ref 11.6–15.9)
LYMPH%: 43 % (ref 14.0–49.7)
MCH: 28.7 pg (ref 25.1–34.0)
MCHC: 34.2 g/dL (ref 31.5–36.0)
MCV: 83.9 fL (ref 79.5–101.0)
MONO#: 0.3 10*3/uL (ref 0.1–0.9)
MONO%: 8.1 % (ref 0.0–14.0)
NEUT#: 1.7 10*3/uL (ref 1.5–6.5)
NEUT%: 46.4 % (ref 38.4–76.8)
NRBC: 0 % (ref 0–0)
PLATELETS: 123 10*3/uL — AB (ref 145–400)
RBC: 4.11 10*6/uL (ref 3.70–5.45)
RDW: 15.5 % — AB (ref 11.2–14.5)
WBC: 3.6 10*3/uL — ABNORMAL LOW (ref 3.9–10.3)
lymph#: 1.5 10*3/uL (ref 0.9–3.3)

## 2016-05-20 NOTE — Progress Notes (Signed)
Hematology and Oncology Follow Up Visit  Susan [name] [id number] 1941-08-21 75 y.o. 05/20/2016 8:34 AM [name],Susan DAVID, MDMcKeown, Susan Saxon, MD   Principle Diagnosis: This is a 75 year old woman with anemia of renal disease diagnosed in 2010. She also has element of early myelodysplasia.  Secondary Diagnosis: Controlled hypertension.  Current therapy: She is on Aranesp 300 mcg every 3 weeks to keep her hemoglobin above 10.  Interim History:  [name] presents today for a follow-up visit. Since her last visit, she reports no major changes in her health. She continues to live independently and attends activities of daily living. She does report occasional dyspnea on exertion especially if her hemoglobin drifts down below 11. She denied any hematochezia or melena. Denies any changes in her weight or appetite. She does report sinus congestion without any cough or hemoptysis. She denied any fevers or constitutional symptoms. She is looking forward to getting back to school with the fall semester starting in September.   She does not report any headaches, blurry vision, syncope or seizures. She denies bleeding, bruising or lymphadenopathy. He has not reported any fevers or chills or sweats. She has not reported any chest pain or palpitation. Does not report any shortness of breath or wheezing. She does not report any abdominal pain or early satiety. She does not report any hematochezia or melena.  Rest of her review of systems unremarkable.  Medications: I have reviewed the patient's current medications.  Current Outpatient Prescriptions  Medication Sig Dispense Refill  . amLODipine (NORVASC) 10 MG tablet TAKE 1/2 TO 1 TABLET BY MOUTH EVERY DAY FOR BLOOD PRESSURE 30 tablet 3  . aspirin 81 MG chewable tablet Chew 81 mg by mouth every morning.     [name] azelastine (ASTELIN) 0.1 % nasal spray Place 2 sprays into both nostrils 2 (two) times daily. Use in each nostril as directed 30 mL 2  . Cetirizine  HCl 10 MG CAPS Take 1 capsule (10 mg total) by mouth at bedtime. (Patient taking differently: Take 10 mg by mouth at bedtime as needed (allergies). ) 90 capsule 0  . Cholecalciferol (VITAMIN D-3) 5000 UNITS TABS Take 10,000 Units by mouth daily.     . Darbepoetin Alfa-Albumin (ARANESP IJ) Inject as directed every 21 ( twenty-one) days.     . diclofenac (VOLTAREN) 75 MG EC tablet TAKE 1 TABLET(75 MG) BY MOUTH TWICE DAILY 60 tablet 5  . diphenoxylate-atropine (LOMOTIL) 2.5-0.025 MG tablet Take 2 tablets by mouth 4 (four) times daily as needed for diarrhea or loose stools. 30 tablet 0  . ipratropium (ATROVENT) 0.03 % nasal spray Take 1 to 2 sprays into each nostril up to 2 or 3 x day as needed 30 mL 99  . labetalol (NORMODYNE) 300 MG tablet Take 1 tablet (300 mg total) by mouth 2 (two) times daily. 180 tablet 99  . levothyroxine (SYNTHROID, LEVOTHROID) 100 MCG tablet TAKE 1 TABLET BY MOUTH DAILY ON AN EMPTY STOMACH FOR 30 MINUTES 90 tablet 3  . losartan-hydrochlorothiazide (HYZAAR) 100-25 MG per tablet Take 1 tablet daily for BP (Patient taking differently: Take 0.5 tablets by mouth daily. ) 90 tablet 1  . pantoprazole (PROTONIX) 40 MG tablet TAKE 1 TABLET BY MOUTH EVERY DAY FOR ACID REFLUX 90 tablet 1   No current facility-administered medications for this visit.      Past Medical History, Surgical history, Social history, and Family History were reviewed and updated.  [name]  Physical Exam: Blood pressure 120/64, pulse 63, temperature 97.6 F (36.4  C), temperature source Oral, resp. rate 17, height '5\' 2"'  (1.575 m), weight 166 lb 11.2 oz (75.6 kg), SpO2 100 %. ECOG: 1 General appearance:Well appearing woman without distress. Head: Normocephalic, without obvious abnormality no oral thrush noted. Neck: no adenopathy Lymph nodes: Cervical, supraclavicular, and axillary nodes normal. Heart:regular rate and rhythm, S1, S2 normal, no murmur, click, rub or gallop Lung:chest clear, no wheezing, rales,  normal symmetric air entry Abdomen: soft, non-tender, without masses or organomegaly no shifting dullness or ascites. EXT:no erythema, induration, or nodules   Lab Results: Lab Results  Component Value Date   WBC 3.6 (L) 05/20/2016   HGB 11.8 05/20/2016   HCT 34.5 (L) 05/20/2016   MCV 83.9 05/20/2016   PLT 123 (L) 05/20/2016     Chemistry      Component Value Date/Time   NA 139 03/08/2016 1204   K 3.9 03/08/2016 1204   CL 107 03/08/2016 1204   CO2 24 03/08/2016 1204   BUN 19 03/08/2016 1204   CREATININE 1.67 (H) 03/08/2016 1204   CREATININE 1.55 (H) 12/14/2015 0915      Component Value Date/Time   CALCIUM 9.8 03/08/2016 1204   ALKPHOS 99 04/11/2016 0941   AST 17 04/11/2016 0941   ALT 8 04/11/2016 0941   BILITOT 0.6 04/11/2016 0941      Impression and Plan:  This is a pleasant 75 year old female with the following issues:  1. 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 She is currently receiving Aranesp every 3 weeks to keep her hemoglobin above 10 where she functions the best. Her hemoglobin is 11.8 and she will not receive any injections today. Her iron studies in May 2017 also within normal range. The plan is to continue to check her counts every 3 weeks and resume Aranesp to keep her hemoglobin between 10 and 11. 2. Hypertension:  Her blood pressure is normal without any major changes. 3. History of questionable myelodysplasia: No major changes in her counts and will continue to monitor this. We will consider repeat bone marrow biopsy in the future if she develops any worsening cytopenias. 4. Thrombocytopenia: Remains at very mild and asymptomatic. Could be related to early myelodysplasia without any changes at this time. 5. Follow up. Every 3 weeks for CBC and injection. He'll return for a clinical visit in 3 months.       [facility] MD 8/18/20178:34 AM

## 2016-05-20 NOTE — Telephone Encounter (Signed)
Gave pt cal & avs °

## 2016-05-30 ENCOUNTER — Ambulatory Visit (INDEPENDENT_AMBULATORY_CARE_PROVIDER_SITE_OTHER): Payer: Medicare Other | Admitting: Internal Medicine

## 2016-05-30 ENCOUNTER — Encounter: Payer: Self-pay | Admitting: Internal Medicine

## 2016-05-30 DIAGNOSIS — J069 Acute upper respiratory infection, unspecified: Secondary | ICD-10-CM | POA: Diagnosis not present

## 2016-05-30 MED ORDER — CETIRIZINE HCL 10 MG PO CAPS: 10.0000 mg | ORAL_CAPSULE | Freq: Every day | ORAL | 0 refills | Status: DC

## 2016-05-30 MED ORDER — PREDNISONE 20 MG PO TABS: ORAL_TABLET | ORAL | 0 refills | Status: DC

## 2016-05-30 NOTE — Progress Notes (Signed)
HPI  Patient presents to the office for evaluation of dizziness and congestion.  It has been going on for 1 weeks.  Patient reports throat clearing and coughing up post nasal drainage.  They also endorse change in voice, postnasal drip and clear rhinorrhea, ear pressure, she feels like she is falling sideways.  .  They have tried none.  They report that nothing has worked.  They denies other sick contacts.  She has been checking her BP.  They are usually 128/70.  They have been running around normal for her.    Review of Systems  Constitutional: Negative for chills, fever and malaise/fatigue.  HENT: Positive for congestion. Negative for ear discharge, ear pain and sore throat.   Respiratory: Positive for cough. Negative for shortness of breath and wheezing.   Cardiovascular: Negative for chest pain, palpitations and leg swelling.  Neurological: Positive for headaches.    PE:  Vitals:   05/30/16 1550  BP: (!) 150/86  Pulse: 60  Resp: 16  Temp: 98.2 F (36.8 C)    General:  Alert and non-toxic, WDWN, NAD HEENT: NCAT, PERLA, EOM normal, no occular discharge or erythema.  Nasal mucosal edema with sinus tenderness to palpation.  Oropharynx clear with minimal oropharyngeal edema and erythema.  Mucous membranes moist and pink. Neck:  Cervical adenopathy Chest:  RRR no MRGs.  Lungs clear to auscultation A&P with no wheezes rhonchi or rales.   Abdomen: +BS x 4 quadrants, soft, non-tender, no guarding, rigidity, or rebound. Skin: warm and dry no rash Neuro: A&Ox4, CN II-XII grossly intact  Assessment and Plan:   1. Acute URI -increase asteline to twice daily -cont atrovent nasal spray -prednisone - Cetirizine HCl 10 MG CAPS; Take 1 capsule (10 mg total) by mouth at bedtime.  Dispense: 90 capsule; Refill: 0

## 2016-05-30 NOTE — Patient Instructions (Signed)
Please get diphenhydramine or [facility]ad[facility]  You can take 1-2 tablets at bedtime to help with the sinus congestion.    Please take the cetirizine daily.  Please use astelin nose sprays 2 sprays per nostril twice daily.  Please use the ipatropium up to 3 times per day if you need to.  Please take the prednisone until it is gone.

## 2016-06-10 ENCOUNTER — Other Ambulatory Visit: Payer: Medicare Other

## 2016-06-10 ENCOUNTER — Ambulatory Visit (HOSPITAL_BASED_OUTPATIENT_CLINIC_OR_DEPARTMENT_OTHER): Payer: Medicare Other

## 2016-06-10 VITALS — BP 142/90 | HR 59 | Temp 97.8°F | Resp 18

## 2016-06-10 DIAGNOSIS — D649 Anemia, unspecified: Secondary | ICD-10-CM | POA: Diagnosis not present

## 2016-06-10 DIAGNOSIS — D509 Iron deficiency anemia, unspecified: Secondary | ICD-10-CM

## 2016-06-10 DIAGNOSIS — D469 Myelodysplastic syndrome, unspecified: Secondary | ICD-10-CM

## 2016-06-10 LAB — CBC WITH DIFFERENTIAL/PLATELET
BASO%: 0.3 % (ref 0.0–2.0)
BASOS ABS: 0 10*3/uL (ref 0.0–0.1)
EOS ABS: 0.1 10*3/uL (ref 0.0–0.5)
EOS%: 1.1 % (ref 0.0–7.0)
HEMATOCRIT: 32.1 % — AB (ref 34.8–46.6)
HGB: 10.6 g/dL — ABNORMAL LOW (ref 11.6–15.9)
LYMPH#: 2.2 10*3/uL (ref 0.9–3.3)
LYMPH%: 28.6 % (ref 14.0–49.7)
MCH: 28 pg (ref 25.1–34.0)
MCHC: 33.1 g/dL (ref 31.5–36.0)
MCV: 84.6 fL (ref 79.5–101.0)
MONO#: 0.8 10*3/uL (ref 0.1–0.9)
MONO%: 10.3 % (ref 0.0–14.0)
NEUT#: 4.5 10*3/uL (ref 1.5–6.5)
NEUT%: 59.7 % (ref 38.4–76.8)
PLATELETS: 148 10*3/uL (ref 145–400)
RBC: 3.79 10*6/uL (ref 3.70–5.45)
RDW: 15.2 % — ABNORMAL HIGH (ref 11.2–14.5)
WBC: 7.5 10*3/uL (ref 3.9–10.3)

## 2016-06-10 LAB — FERRITIN: Ferritin: 157 ng/ml (ref 9–269)

## 2016-06-10 LAB — IRON AND TIBC
%SAT: 45 % (ref 21–57)
Iron: 104 ug/dL (ref 41–142)
TIBC: 234 ug/dL — AB (ref 236–444)
UIBC: 130 ug/dL (ref 120–384)

## 2016-06-10 MED ORDER — DARBEPOETIN ALFA 300 MCG/0.6ML IJ SOSY
300.0000 ug | PREFILLED_SYRINGE | INTRAMUSCULAR | Status: DC
  Administered 2016-06-10: 300 ug via SUBCUTANEOUS
  Filled 2016-06-10: qty 0.6

## 2016-06-21 ENCOUNTER — Other Ambulatory Visit: Payer: Self-pay | Admitting: Internal Medicine

## 2016-06-21 DIAGNOSIS — I10 Essential (primary) hypertension: Secondary | ICD-10-CM

## 2016-07-01 ENCOUNTER — Ambulatory Visit (HOSPITAL_BASED_OUTPATIENT_CLINIC_OR_DEPARTMENT_OTHER): Payer: Medicare Other

## 2016-07-01 ENCOUNTER — Other Ambulatory Visit (HOSPITAL_BASED_OUTPATIENT_CLINIC_OR_DEPARTMENT_OTHER): Payer: Medicare Other

## 2016-07-01 VITALS — BP 167/75 | HR 62 | Temp 98.3°F | Resp 20

## 2016-07-01 DIAGNOSIS — N189 Chronic kidney disease, unspecified: Secondary | ICD-10-CM

## 2016-07-01 DIAGNOSIS — D631 Anemia in chronic kidney disease: Secondary | ICD-10-CM | POA: Diagnosis not present

## 2016-07-01 DIAGNOSIS — D469 Myelodysplastic syndrome, unspecified: Secondary | ICD-10-CM

## 2016-07-01 LAB — CBC WITH DIFFERENTIAL/PLATELET
BASO%: 0 % (ref 0.0–2.0)
Basophils Absolute: 0 10*3/uL (ref 0.0–0.1)
EOS ABS: 0.2 10*3/uL (ref 0.0–0.5)
EOS%: 6.1 % (ref 0.0–7.0)
HEMATOCRIT: 28.8 % — AB (ref 34.8–46.6)
HEMOGLOBIN: 9.7 g/dL — AB (ref 11.6–15.9)
LYMPH#: 1.5 10*3/uL (ref 0.9–3.3)
LYMPH%: 50.7 % — ABNORMAL HIGH (ref 14.0–49.7)
MCH: 28.4 pg (ref 25.1–34.0)
MCHC: 33.7 g/dL (ref 31.5–36.0)
MCV: 84.2 fL (ref 79.5–101.0)
MONO#: 0.2 10*3/uL (ref 0.1–0.9)
MONO%: 7.1 % (ref 0.0–14.0)
NEUT%: 36.1 % — ABNORMAL LOW (ref 38.4–76.8)
NEUTROS ABS: 1.1 10*3/uL — AB (ref 1.5–6.5)
PLATELETS: 127 10*3/uL — AB (ref 145–400)
RBC: 3.42 10*6/uL — ABNORMAL LOW (ref 3.70–5.45)
RDW: 16.3 % — AB (ref 11.2–14.5)
WBC: 3 10*3/uL — AB (ref 3.9–10.3)

## 2016-07-01 MED ORDER — DARBEPOETIN ALFA 300 MCG/0.6ML IJ SOSY
300.0000 ug | PREFILLED_SYRINGE | INTRAMUSCULAR | Status: DC
  Administered 2016-07-01: 300 ug via SUBCUTANEOUS
  Filled 2016-07-01: qty 0.6

## 2016-07-01 NOTE — Patient Instructions (Signed)
Darbepoetin Alfa injection What is this medicine? DARBEPOETIN ALFA (dar be POE e tin AL fa) helps your body make more red blood cells. It is used to treat anemia caused by chronic kidney failure and chemotherapy. This medicine may be used for other purposes; ask your health care provider or pharmacist if you have questions. What should I tell my health care provider before I take this medicine? They need to know if you have any of these conditions: -blood clotting disorders or history of blood clots -cancer patient not on chemotherapy -cystic fibrosis -heart disease, such as angina, heart failure, or a history of a heart attack -hemoglobin level of 12 g/dL or greater -high blood pressure -low levels of folate, iron, or vitamin B12 -seizures -an unusual or allergic reaction to darbepoetin, erythropoietin, albumin, hamster proteins, latex, other medicines, foods, dyes, or preservatives -pregnant or trying to get pregnant -breast-feeding How should I use this medicine? This medicine is for injection into a vein or under the skin. It is usually given by a health care professional in a hospital or clinic setting. If you get this medicine at home, you will be taught how to prepare and give this medicine. Do not shake the solution before you withdraw a dose. Use exactly as directed. Take your medicine at regular intervals. Do not take your medicine more often than directed. It is important that you put your used needles and syringes in a special sharps container. Do not put them in a trash can. If you do not have a sharps container, call your pharmacist or healthcare provider to get one. Talk to your pediatrician regarding the use of this medicine in children. While this medicine may be used in children as young as 1 year for selected conditions, precautions do apply. Overdosage: If you think you have taken too much of this medicine contact a poison control center or emergency room at once. NOTE:  This medicine is only for you. Do not share this medicine with others. What if I miss a dose? If you miss a dose, take it as soon as you can. If it is almost time for your next dose, take only that dose. Do not take double or extra doses. What may interact with this medicine? Do not take this medicine with any of the following medications: -epoetin alfa This list may not describe all possible interactions. Give your health care provider a list of all the medicines, herbs, non-prescription drugs, or dietary supplements you use. Also tell them if you smoke, drink alcohol, or use illegal drugs. Some items may interact with your medicine. What should I watch for while using this medicine? Visit your prescriber or health care professional for regular checks on your progress and for the needed blood tests and blood pressure measurements. It is especially important for the doctor to make sure your hemoglobin level is in the desired range, to limit the risk of potential side effects and to give you the best benefit. Keep all appointments for any recommended tests. Check your blood pressure as directed. Ask your doctor what your blood pressure should be and when you should contact him or her. As your body makes more red blood cells, you may need to take iron, folic acid, or vitamin B supplements. Ask your doctor or health care provider which products are right for you. If you have kidney disease continue dietary restrictions, even though this medication can make you feel better. Talk with your doctor or health care professional about the   foods you eat and the vitamins that you take. What side effects may I notice from receiving this medicine? Side effects that you should report to your doctor or health care professional as soon as possible: -allergic reactions like skin rash, itching or hives, swelling of the face, lips, or tongue -breathing problems -changes in vision -chest pain -confusion, trouble speaking  or understanding -feeling faint or lightheaded, falls -high blood pressure -muscle aches or pains -pain, swelling, warmth in the leg -rapid weight gain -severe headaches -sudden numbness or weakness of the face, arm or leg -trouble walking, dizziness, loss of balance or coordination -seizures (convulsions) -swelling of the ankles, feet, hands -unusually weak or tired Side effects that usually do not require medical attention (report to your doctor or health care professional if they continue or are bothersome): -diarrhea -fever, chills (flu-like symptoms) -headaches -nausea, vomiting -redness, stinging, or swelling at site where injected This list may not describe all possible side effects. Call your doctor for medical advice about side effects. You may report side effects to FDA at 1-800-FDA-1088. Where should I keep my medicine? Keep out of the reach of children. Store in a refrigerator between 2 and 8 degrees C (36 and 46 degrees F). Do not freeze. Do not shake. Throw away any unused portion if using a single-dose vial. Throw away any unused medicine after the expiration date. NOTE: This sheet is a summary. It may not cover all possible information. If you have questions about this medicine, talk to your doctor, pharmacist, or health care provider.    2016, [employer]. (2008-09-02 10:23:57)  

## 2016-07-13 ENCOUNTER — Ambulatory Visit: Payer: Self-pay | Admitting: Internal Medicine

## 2016-07-22 ENCOUNTER — Ambulatory Visit (HOSPITAL_BASED_OUTPATIENT_CLINIC_OR_DEPARTMENT_OTHER): Payer: Medicare Other

## 2016-07-22 ENCOUNTER — Other Ambulatory Visit (HOSPITAL_BASED_OUTPATIENT_CLINIC_OR_DEPARTMENT_OTHER): Payer: BLUE CROSS/BLUE SHIELD

## 2016-07-22 VITALS — BP 122/73 | HR 61 | Temp 97.8°F | Resp 18

## 2016-07-22 DIAGNOSIS — D469 Myelodysplastic syndrome, unspecified: Secondary | ICD-10-CM | POA: Diagnosis not present

## 2016-07-22 DIAGNOSIS — N183 Chronic kidney disease, stage 3 unspecified: Secondary | ICD-10-CM

## 2016-07-22 DIAGNOSIS — D631 Anemia in chronic kidney disease: Secondary | ICD-10-CM

## 2016-07-22 DIAGNOSIS — N189 Chronic kidney disease, unspecified: Principal | ICD-10-CM

## 2016-07-22 LAB — CBC WITH DIFFERENTIAL/PLATELET
BASO%: 0.6 % (ref 0.0–2.0)
Basophils Absolute: 0 10*3/uL (ref 0.0–0.1)
EOS%: 4.5 % (ref 0.0–7.0)
Eosinophils Absolute: 0.1 10*3/uL (ref 0.0–0.5)
HCT: 32.9 % — ABNORMAL LOW (ref 34.8–46.6)
HGB: 10.8 g/dL — ABNORMAL LOW (ref 11.6–15.9)
LYMPH%: 56.9 % — ABNORMAL HIGH (ref 14.0–49.7)
MCH: 27.7 pg (ref 25.1–34.0)
MCHC: 32.9 g/dL (ref 31.5–36.0)
MCV: 84.2 fL (ref 79.5–101.0)
MONO#: 0.3 10*3/uL (ref 0.1–0.9)
MONO%: 9.7 % (ref 0.0–14.0)
NEUT#: 0.9 10*3/uL — ABNORMAL LOW (ref 1.5–6.5)
NEUT%: 28.3 % — ABNORMAL LOW (ref 38.4–76.8)
Platelets: 154 10*3/uL (ref 145–400)
RBC: 3.9 10*6/uL (ref 3.70–5.45)
RDW: 17.5 % — ABNORMAL HIGH (ref 11.2–14.5)
WBC: 3.1 10*3/uL — ABNORMAL LOW (ref 3.9–10.3)
lymph#: 1.8 10*3/uL (ref 0.9–3.3)

## 2016-07-22 MED ORDER — DARBEPOETIN ALFA 300 MCG/0.6ML IJ SOSY
300.0000 ug | PREFILLED_SYRINGE | INTRAMUSCULAR | Status: DC
  Administered 2016-07-22: 300 ug via SUBCUTANEOUS
  Filled 2016-07-22: qty 0.6

## 2016-07-22 NOTE — Patient Instructions (Signed)
Darbepoetin Alfa injection What is this medicine? DARBEPOETIN ALFA (dar be POE e tin AL fa) helps your body make more red blood cells. It is used to treat anemia caused by chronic kidney failure and chemotherapy. This medicine may be used for other purposes; ask your health care provider or pharmacist if you have questions. COMMON BRAND NAME(S): Aranesp What should I tell my health care provider before I take this medicine? They need to know if you have any of these conditions: -blood clotting disorders or history of blood clots -cancer patient not on chemotherapy -cystic fibrosis -heart disease, such as angina, heart failure, or a history of a heart attack -hemoglobin level of 12 g/dL or greater -high blood pressure -low levels of folate, iron, or vitamin B12 -seizures -an unusual or allergic reaction to darbepoetin, erythropoietin, albumin, hamster proteins, latex, other medicines, foods, dyes, or preservatives -pregnant or trying to get pregnant -breast-feeding How should I use this medicine? This medicine is for injection into a vein or under the skin. It is usually given by a health care professional in a hospital or clinic setting. If you get this medicine at home, you will be taught how to prepare and give this medicine. Do not shake the solution before you withdraw a dose. Use exactly as directed. Take your medicine at regular intervals. Do not take your medicine more often than directed. It is important that you put your used needles and syringes in a special sharps container. Do not put them in a trash can. If you do not have a sharps container, call your pharmacist or healthcare provider to get one. Talk to your pediatrician regarding the use of this medicine in children. While this medicine may be used in children as young as 1 year for selected conditions, precautions do apply. Overdosage: If you think you have taken too much of this medicine contact a poison control center or  emergency room at once. NOTE: This medicine is only for you. Do not share this medicine with others. What if I miss a dose? If you miss a dose, take it as soon as you can. If it is almost time for your next dose, take only that dose. Do not take double or extra doses. What may interact with this medicine? Do not take this medicine with any of the following medications: -epoetin alfa This list may not describe all possible interactions. Give your health care provider a list of all the medicines, herbs, non-prescription drugs, or dietary supplements you use. Also tell them if you smoke, drink alcohol, or use illegal drugs. Some items may interact with your medicine. What should I watch for while using this medicine? Visit your prescriber or health care professional for regular checks on your progress and for the needed blood tests and blood pressure measurements. It is especially important for the doctor to make sure your hemoglobin level is in the desired range, to limit the risk of potential side effects and to give you the best benefit. Keep all appointments for any recommended tests. Check your blood pressure as directed. Ask your doctor what your blood pressure should be and when you should contact him or her. As your body makes more red blood cells, you may need to take iron, folic acid, or vitamin B supplements. Ask your doctor or health care provider which products are right for you. If you have kidney disease continue dietary restrictions, even though this medication can make you feel better. Talk with your doctor or health   care professional about the foods you eat and the vitamins that you take. What side effects may I notice from receiving this medicine? Side effects that you should report to your doctor or health care professional as soon as possible: -allergic reactions like skin rash, itching or hives, swelling of the face, lips, or tongue -breathing problems -changes in vision -chest  pain -confusion, trouble speaking or understanding -feeling faint or lightheaded, falls -high blood pressure -muscle aches or pains -pain, swelling, warmth in the leg -rapid weight gain -severe headaches -sudden numbness or weakness of the face, arm or leg -trouble walking, dizziness, loss of balance or coordination -seizures (convulsions) -swelling of the ankles, feet, hands -unusually weak or tired Side effects that usually do not require medical attention (report to your doctor or health care professional if they continue or are bothersome): -diarrhea -fever, chills (flu-like symptoms) -headaches -nausea, vomiting -redness, stinging, or swelling at site where injected This list may not describe all possible side effects. Call your doctor for medical advice about side effects. You may report side effects to FDA at 1-800-FDA-1088. Where should I keep my medicine? Keep out of the reach of children. Store in a refrigerator between 2 and 8 degrees C (36 and 46 degrees F). Do not freeze. Do not shake. Throw away any unused portion if using a single-dose vial. Throw away any unused medicine after the expiration date. NOTE: This sheet is a summary. It may not cover all possible information. If you have questions about this medicine, talk to your doctor, pharmacist, or health care provider.  2015, [employer]. (2008-09-02 10:23:57)  

## 2016-07-27 ENCOUNTER — Encounter: Payer: Self-pay | Admitting: Internal Medicine

## 2016-07-27 ENCOUNTER — Ambulatory Visit (INDEPENDENT_AMBULATORY_CARE_PROVIDER_SITE_OTHER): Payer: Medicare Other | Admitting: Internal Medicine

## 2016-07-27 VITALS — BP 138/66 | HR 64 | Temp 98.2°F | Resp 16 | Ht 62.0 in | Wt 170.0 lb

## 2016-07-27 DIAGNOSIS — M1990 Unspecified osteoarthritis, unspecified site: Secondary | ICD-10-CM | POA: Diagnosis not present

## 2016-07-27 DIAGNOSIS — E039 Hypothyroidism, unspecified: Secondary | ICD-10-CM | POA: Diagnosis not present

## 2016-07-27 DIAGNOSIS — E782 Mixed hyperlipidemia: Secondary | ICD-10-CM | POA: Diagnosis not present

## 2016-07-27 DIAGNOSIS — Z79899 Other long term (current) drug therapy: Secondary | ICD-10-CM

## 2016-07-27 DIAGNOSIS — I10 Essential (primary) hypertension: Secondary | ICD-10-CM | POA: Diagnosis not present

## 2016-07-27 DIAGNOSIS — E559 Vitamin D deficiency, unspecified: Secondary | ICD-10-CM | POA: Diagnosis not present

## 2016-07-27 DIAGNOSIS — R7303 Prediabetes: Secondary | ICD-10-CM

## 2016-07-27 DIAGNOSIS — J309 Allergic rhinitis, unspecified: Secondary | ICD-10-CM

## 2016-07-27 DIAGNOSIS — N183 Chronic kidney disease, stage 3 unspecified: Secondary | ICD-10-CM

## 2016-07-27 LAB — HEPATIC FUNCTION PANEL
ALK PHOS: 82 U/L (ref 33–130)
ALT: 8 U/L (ref 6–29)
AST: 15 U/L (ref 10–35)
Albumin: 4.2 g/dL (ref 3.6–5.1)
BILIRUBIN DIRECT: 0.1 mg/dL (ref <=0.2)
BILIRUBIN TOTAL: 0.6 mg/dL (ref 0.2–1.2)
Indirect Bilirubin: 0.5 mg/dL (ref 0.2–1.2)
Total Protein: 7 g/dL (ref 6.1–8.1)

## 2016-07-27 LAB — CBC WITH DIFFERENTIAL/PLATELET
BASOS ABS: 0 {cells}/uL (ref 0–200)
Basophils Relative: 0 %
EOS ABS: 78 {cells}/uL (ref 15–500)
Eosinophils Relative: 2 %
HCT: 34.3 % — ABNORMAL LOW (ref 35.0–45.0)
HEMOGLOBIN: 11.1 g/dL — AB (ref 11.7–15.5)
LYMPHS ABS: 1911 {cells}/uL (ref 850–3900)
Lymphocytes Relative: 49 %
MCH: 27.8 pg (ref 27.0–33.0)
MCHC: 32.4 g/dL (ref 32.0–36.0)
MCV: 85.8 fL (ref 80.0–100.0)
MONOS PCT: 10 %
Monocytes Absolute: 390 cells/uL (ref 200–950)
NEUTROS ABS: 1521 {cells}/uL (ref 1500–7800)
Neutrophils Relative %: 39 %
PLATELETS: 161 10*3/uL (ref 140–400)
RBC: 4 MIL/uL (ref 3.80–5.10)
RDW: 17.3 % — ABNORMAL HIGH (ref 11.0–15.0)
WBC: 3.9 10*3/uL (ref 3.8–10.8)

## 2016-07-27 LAB — LIPID PANEL
Cholesterol: 184 mg/dL (ref 125–200)
HDL: 61 mg/dL (ref >=46)
LDL Cholesterol: 98 mg/dL (ref <130)
Total CHOL/HDL Ratio: 3 Ratio (ref <=5.0)
Triglycerides: 126 mg/dL (ref <150)
VLDL: 25 mg/dL (ref <30)

## 2016-07-27 LAB — BASIC METABOLIC PANEL WITH GFR
BUN: 22 mg/dL (ref 7–25)
CHLORIDE: 107 mmol/L (ref 98–110)
CO2: 24 mmol/L (ref 20–31)
Calcium: 9.4 mg/dL (ref 8.6–10.4)
Creat: 1.34 mg/dL — ABNORMAL HIGH (ref 0.60–0.93)
GFR, Est African American: 44 mL/min — ABNORMAL LOW (ref >=60)
GFR, Est Non African American: 38 mL/min — ABNORMAL LOW (ref >=60)
Glucose, Bld: 82 mg/dL (ref 65–99)
POTASSIUM: 3.6 mmol/L (ref 3.5–5.3)
SODIUM: 140 mmol/L (ref 135–146)

## 2016-07-27 LAB — TSH: TSH: 2.94 mIU/L

## 2016-07-27 MED ORDER — DICLOFENAC SODIUM 1 % TD GEL: 2.0000 g | Freq: Four times a day (QID) | TRANSDERMAL | 2 refills | Status: DC

## 2016-07-27 NOTE — Patient Instructions (Signed)
Please start taking claritin, zyrtec, or allegra once daily.   Please continue your nasal sprays.  Please continue to take benadryl daily.  Please take the zpak until it is gone.  I did send in a topical cream to help with the arthritis pain in your hands.  If this is expensive when you pick it up at the pharmacy please wait because we will need to do a prior authorization to get it covered.

## 2016-07-27 NOTE — Progress Notes (Signed)
Assessment and Plan:  Hypertension:  -Continue medication -monitor blood pressure at home. -Continue DASH diet -Reminder to go to the ER if any CP, SOB, nausea, dizziness, severe HA, changes vision/speech, left arm numbness and tingling and jaw pain.  Cholesterol - Continue diet and exercise -Check cholesterol.   Diabetes with diabetic chronic kidney disease -Continue diet and exercise.  -Check A1C  Vitamin D Def -check level -continue medications.   Degenerative joint disease -cont voltaren tablets -voltaren gel prn -may need prednisone low dose daily  Allergic rhinitis -cont astelin -add in flonase -cont daily zyrtec -benadryl prn at bedtime.    Continue diet and meds as discussed. Further disposition pending results of labs. Discussed med's effects and SE's.    HPI 75 y.o. female  presents for 3 month follow up with hypertension, hyperlipidemia, diabetes and vitamin D deficiency.   Her blood pressure has been controlled at home, today their BP is BP: 138/66.She does not workout. She denies chest pain, shortness of breath, dizziness.   She is on cholesterol medication and denies myalgias. Her cholesterol is at goal. The cholesterol was:  04/11/2016: Cholesterol 194; HDL 71; LDL Cholesterol 105; Triglycerides 88   She has been working on diet and exercise for prediabetes with diabetic chronic kidney disease, she is on bASA, she is on ACE/ARB, and denies  foot ulcerations, hyperglycemia, hypoglycemia , increased appetite, nausea, paresthesia of the feet, polydipsia, polyuria and visual disturbances. Last A1C was: 04/11/2016: Hgb A1c MFr Bld 4.7   Patient is on Vitamin D supplement. 04/11/2016: Vit D, 25-Hydroxy 48  She is having some issues with her sinuses.  She reports that she has had some congestion, runny nose, and headaches.    She reports that she is having a lot of soreness and pain in her hand.  It sometimes hurts for her to even close it.  She reports that it is  her left hand.  She is right hand dominant.    Current Medications:  Current Outpatient Prescriptions on File Prior to Visit  Medication Sig Dispense Refill  . amLODipine (NORVASC) 10 MG tablet TAKE 1/2 TO 1 TABLET BY MOUTH EVERY DAY FOR BLOOD PRESSURE 30 tablet 3  . aspirin 81 MG chewable tablet Chew 81 mg by mouth every morning.     [name] azelastine (ASTELIN) 0.1 % nasal spray Place 2 sprays into both nostrils 2 (two) times daily. Use in each nostril as directed 30 mL 2  . Cetirizine HCl 10 MG CAPS Take 1 capsule (10 mg total) by mouth at bedtime. 90 capsule 0  . Cholecalciferol (VITAMIN D-3) 5000 UNITS TABS Take 15,000 Units by mouth daily.     . Darbepoetin Alfa-Albumin (ARANESP IJ) Inject as directed every 21 ( twenty-one) days.     . diclofenac (VOLTAREN) 75 MG EC tablet TAKE 1 TABLET(75 MG) BY MOUTH TWICE DAILY 60 tablet 5  . diphenoxylate-atropine (LOMOTIL) 2.5-0.025 MG tablet Take 2 tablets by mouth 4 (four) times daily as needed for diarrhea or loose stools. 30 tablet 0  . ipratropium (ATROVENT) 0.03 % nasal spray Take 1 to 2 sprays into each nostril up to 2 or 3 x day as needed 30 mL 99  . labetalol (NORMODYNE) 300 MG tablet TAKE 1 TABLET BY MOUTH TWICE DAILY 180 tablet 0  . levothyroxine (SYNTHROID, LEVOTHROID) 100 MCG tablet TAKE 1 TABLET BY MOUTH DAILY ON AN EMPTY STOMACH FOR 30 MINUTES 90 tablet 3  . losartan-hydrochlorothiazide (HYZAAR) 100-25 MG per tablet Take 1 tablet daily  for BP (Patient taking differently: Take 0.5 tablets by mouth daily. ) 90 tablet 1  . pantoprazole (PROTONIX) 40 MG tablet TAKE 1 TABLET BY MOUTH EVERY DAY FOR ACID REFLUX 90 tablet 1   No current facility-administered medications on file prior to visit.    Medical History:  Past Medical History:  Diagnosis Date  . Anemia   . Arthritis   . GERD (gastroesophageal reflux disease)   . HLD (hyperlipidemia)   . Hypertension   . Hypothyroidism   . Myelodysplasia   . Vitamin D deficiency    Allergies:   Allergies  Allergen Reactions  . Codeine Anaphylaxis  . Penicillins Anaphylaxis      . Bactrim Itching and Swelling  . Clarithromycin Other (See Comments)    "Caused skin to peel off my hand."  . Flagyl [Metronidazole Hcl] Itching and Swelling  . Sulfa Antibiotics Itching and Swelling      Review of Systems:  Review of Systems  Constitutional: Negative for chills, fever and malaise/fatigue.  HENT: Positive for congestion, ear pain and sore throat.   Eyes: Negative.   Respiratory: Positive for cough. Negative for shortness of breath and wheezing.   Cardiovascular: Negative for chest pain, palpitations and leg swelling.  Gastrointestinal: Negative for abdominal pain, blood in stool, constipation, diarrhea, heartburn and melena.  Genitourinary: Negative.   Skin: Negative.   Neurological: Positive for headaches. Negative for dizziness, sensory change and loss of consciousness.  Psychiatric/Behavioral: Negative for depression. The patient is not nervous/anxious and does not have insomnia.     Family history- Review and unchanged  Social history- Review and unchanged  Physical Exam: BP 138/66   Pulse 64   Temp 98.2 F (36.8 C) (Temporal)   Resp 16   Ht 5\' 2"  (1.575 m)   Wt 170 lb (77.1 kg)   BMI 31.09 kg/m  Wt Readings from Last 3 Encounters:  07/27/16 170 lb (77.1 kg)  05/30/16 167 lb (75.8 kg)  05/20/16 166 lb 11.2 oz (75.6 kg)   General Appearance: Well nourished well developed, non-toxic appearing, in no apparent distress. Eyes: PERRLA, EOMs, conjunctiva no swelling or erythema ENT/Mouth: Ear canals clear with no erythema, swelling, or discharge.  TMs normal bilaterally, oropharynx clear, moist, with no exudate.   Neck: Supple, thyroid normal, no JVD, no cervical adenopathy.  Respiratory: Respiratory effort normal, breath sounds clear A&P, no wheeze, rhonchi or rales noted.  No retractions, no accessory muscle usage Cardio: RRR with no MRGs. No noted edema.   Abdomen: Soft, + BS.  Non tender, no guarding, rebound, hernias, masses. Musculoskeletal: Full ROM, 5/5 strength, Normal gait Skin: Warm, dry without rashes, lesions, ecchymosis.  Neuro: Awake and oriented X 3, Cranial nerves intact. No cerebellar symptoms.  Psych: normal affect, Insight and Judgment appropriate.    Starlyn Skeans, PA-C 11:30 AM [facility]

## 2016-07-28 LAB — HEMOGLOBIN A1C
HEMOGLOBIN A1C: 4.3 % (ref <5.7)
MEAN PLASMA GLUCOSE: 77 mg/dL

## 2016-08-12 ENCOUNTER — Other Ambulatory Visit (HOSPITAL_BASED_OUTPATIENT_CLINIC_OR_DEPARTMENT_OTHER): Payer: BLUE CROSS/BLUE SHIELD

## 2016-08-12 ENCOUNTER — Telehealth: Payer: Self-pay | Admitting: Oncology

## 2016-08-12 ENCOUNTER — Ambulatory Visit: Payer: Medicare Other

## 2016-08-12 ENCOUNTER — Ambulatory Visit (HOSPITAL_BASED_OUTPATIENT_CLINIC_OR_DEPARTMENT_OTHER): Payer: BLUE CROSS/BLUE SHIELD | Admitting: Oncology

## 2016-08-12 VITALS — BP 134/73 | HR 63 | Temp 97.8°F | Resp 18 | Ht 62.0 in | Wt 168.2 lb

## 2016-08-12 DIAGNOSIS — D469 Myelodysplastic syndrome, unspecified: Secondary | ICD-10-CM

## 2016-08-12 DIAGNOSIS — N183 Chronic kidney disease, stage 3 unspecified: Secondary | ICD-10-CM

## 2016-08-12 DIAGNOSIS — N189 Chronic kidney disease, unspecified: Secondary | ICD-10-CM

## 2016-08-12 DIAGNOSIS — D631 Anemia in chronic kidney disease: Secondary | ICD-10-CM | POA: Diagnosis not present

## 2016-08-12 LAB — CBC WITH DIFFERENTIAL/PLATELET
BASO%: 0.2 % (ref 0.0–2.0)
BASOS ABS: 0 10*3/uL (ref 0.0–0.1)
EOS ABS: 0.1 10*3/uL (ref 0.0–0.5)
EOS%: 2.9 % (ref 0.0–7.0)
HEMATOCRIT: 34.3 % — AB (ref 34.8–46.6)
HEMOGLOBIN: 11.6 g/dL (ref 11.6–15.9)
LYMPH#: 2 10*3/uL (ref 0.9–3.3)
LYMPH%: 49.5 % (ref 14.0–49.7)
MCH: 28 pg (ref 25.1–34.0)
MCHC: 33.8 g/dL (ref 31.5–36.0)
MCV: 82.7 fL (ref 79.5–101.0)
MONO#: 0.3 10*3/uL (ref 0.1–0.9)
MONO%: 6.4 % (ref 0.0–14.0)
NEUT#: 1.7 10*3/uL (ref 1.5–6.5)
NEUT%: 41 % (ref 38.4–76.8)
PLATELETS: 128 10*3/uL — AB (ref 145–400)
RBC: 4.15 10*6/uL (ref 3.70–5.45)
RDW: 17.4 % — ABNORMAL HIGH (ref 11.2–14.5)
WBC: 4.1 10*3/uL (ref 3.9–10.3)
nRBC: 0 % (ref 0–0)

## 2016-08-12 NOTE — Telephone Encounter (Signed)
Appointments scheduled per 11/10 LOS. Patient given AVS report and calendars with future scheduled appointments. °

## 2016-08-12 NOTE — Progress Notes (Signed)
Hematology and Oncology Follow Up Visit  Susan [name] [id number] 1941/01/12 75 y.o. 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 She also has element of early myelodysplasia.  Secondary Diagnosis: Controlled hypertension.  Current therapy: She is on Aranesp 300 mcg every 3 weeks to keep her hemoglobin above 10.  Interim History:  Susan [name] presents today for a follow-up visit. Since her last visit, she reports feeling reasonably well without any major complaints. She does report periodic arthritis pain in her back that is managed easily with Tylenol. She continues to live independently and attends activities of daily living. 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   She continues to tolerate Aranesp without any major complications. She does report occasional dyspnea on exertion especially if her hemoglobin drifts down below 11. She denied any hematochezia or melena. Denies any changes in her weight or appetite. She does report sinus congestion without any cough or hemoptysis. She denied any fevers or constitutional symptoms.    She does not report any headaches, blurry vision, syncope or seizures. She denies bleeding, bruising or lymphadenopathy. He has not reported any fevers or chills or sweats. She has not reported any chest pain or palpitation. Does not report any shortness of breath or wheezing. She does not report any abdominal pain or early satiety. She does not report any hematochezia or melena.  Rest of her review of systems unremarkable.  Medications: I have reviewed the patient's current medications.  Current Outpatient Prescriptions  Medication Sig Dispense Refill  . amLODipine (NORVASC) 10 MG tablet TAKE 1/2 TO 1 TABLET BY MOUTH EVERY DAY FOR BLOOD PRESSURE 30 tablet 3  . aspirin 81 MG chewable tablet Chew 81 mg by mouth every morning.     [name]  azelastine (ASTELIN) 0.1 % nasal spray Place 2 sprays into both nostrils 2 (two) times daily. Use in each nostril as directed 30 mL 2  . Cetirizine HCl 10 MG CAPS Take 1 capsule (10 mg total) by mouth at bedtime. 90 capsule 0  . Cholecalciferol (VITAMIN D-3) 5000 UNITS TABS Take 15,000 Units by mouth daily.     . Darbepoetin Alfa-Albumin (ARANESP IJ) Inject as directed every 21 ( twenty-one) days.     . diclofenac (VOLTAREN) 75 MG EC tablet TAKE 1 TABLET(75 MG) BY MOUTH TWICE DAILY 60 tablet 5  . diclofenac sodium (VOLTAREN) 1 % GEL Apply 2 g topically 4 (four) times daily. 100 g 2  . diphenoxylate-atropine (LOMOTIL) 2.5-0.025 MG tablet Take 2 tablets by mouth 4 (four) times daily as needed for diarrhea or loose stools. 30 tablet 0  . ipratropium (ATROVENT) 0.03 % nasal spray Take 1 to 2 sprays into each nostril up to 2 or 3 x day as needed 30 mL 99  . labetalol (NORMODYNE) 300 MG tablet TAKE 1 TABLET BY MOUTH TWICE DAILY 180 tablet 0  . levothyroxine (SYNTHROID, LEVOTHROID) 100 MCG tablet TAKE 1 TABLET BY MOUTH DAILY ON AN EMPTY STOMACH FOR 30 MINUTES 90 tablet 3  . losartan-hydrochlorothiazide (HYZAAR) 100-25 MG per tablet Take 1 tablet daily for BP (Patient taking differently: Take 0.5 tablets by mouth daily. ) 90 tablet 1  . pantoprazole (PROTONIX) 40 MG tablet TAKE 1 TABLET BY MOUTH EVERY DAY FOR ACID REFLUX 90 tablet 1   No current facility-administered medications for this visit.      Past Medical History, Surgical history, Social  history, and Family History were reviewed and updated.  [name]  Physical Exam: Blood pressure 134/73, pulse 63, temperature 97.8 F (36.6 C), temperature source Oral, resp. rate 18, height _0  (1.575 m), weight 168 lb 3.2 oz (76.3 kg), SpO2 100 %. ECOG: 1 General appearance:Alert, awake woman without distress. Head: Normocephalic, without obvious abnormality no oral thrush noted. Neck: no adenopathy Lymph nodes: Cervical, supraclavicular, and axillary nodes  normal. Heart:regular rate and rhythm, S1, S2 normal, no murmur, click, rub or gallop Lung:chest clear, no wheezing, rales, normal symmetric air entry Abdomen: soft, non-tender, without masses or organomegaly no rebound or guarding.   Lab Results: Lab Results  Component Value Date   WBC 3.9 07/27/2016   HGB 11.1 (L) 07/27/2016   HCT 34.3 (L) 07/27/2016   MCV 85.8 07/27/2016   PLT 161 07/27/2016     Chemistry      Component Value Date/Time   NA 140 07/27/2016 1146   K 3.6 07/27/2016 1146   CL 107 07/27/2016 1146   CO2 24 07/27/2016 1146   BUN 22 07/27/2016 1146   CREATININE 1.34 (H) 07/27/2016 1146      Component Value Date/Time   CALCIUM 9.4 07/27/2016 1146   ALKPHOS 82 07/27/2016 1146   AST 15 07/27/2016 1146   ALT 8 07/27/2016 1146   BILITOT 0.6 07/27/2016 1146      Impression and Plan:  This is a pleasant 75 year old female with the following issues:  1. 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 She is currently receiving Aranesp every 3 weeks to keep her hemoglobin above 10 where she functions the best. Her hemoglobin is 11.6  and she will not receive any injections today. Her iron studies in September 2017 showed no need for replacement. The plan is to continue with Aranesp every 3 weeks to keep her hemoglobin close to 11. 2. Hypertension:  Her blood pressure is normal without any major changes. 3. History of questionable myelodysplasia: No major changes in her counts and will continue to monitor this. We will consider repeat bone marrow biopsy in the future if she develops any worsening cytopenias. She does have mild thrombocytopenia which is unchanged. 4. Follow up. Every 3 weeks for CBC and injection. Clinical visit in 3 months.       [facility] MD 11/10/20171:43 PM

## 2016-09-02 ENCOUNTER — Other Ambulatory Visit (HOSPITAL_BASED_OUTPATIENT_CLINIC_OR_DEPARTMENT_OTHER): Payer: Medicare Other

## 2016-09-02 ENCOUNTER — Ambulatory Visit (HOSPITAL_BASED_OUTPATIENT_CLINIC_OR_DEPARTMENT_OTHER): Payer: Medicare Other

## 2016-09-02 VITALS — BP 134/69 | HR 65 | Temp 97.5°F | Resp 20

## 2016-09-02 DIAGNOSIS — D631 Anemia in chronic kidney disease: Secondary | ICD-10-CM | POA: Diagnosis not present

## 2016-09-02 DIAGNOSIS — N183 Chronic kidney disease, stage 3 unspecified: Secondary | ICD-10-CM

## 2016-09-02 DIAGNOSIS — N189 Chronic kidney disease, unspecified: Secondary | ICD-10-CM

## 2016-09-02 DIAGNOSIS — D509 Iron deficiency anemia, unspecified: Secondary | ICD-10-CM

## 2016-09-02 LAB — CBC WITH DIFFERENTIAL/PLATELET
BASO%: 0 % (ref 0.0–2.0)
BASOS ABS: 0 10*3/uL (ref 0.0–0.1)
EOS ABS: 0.1 10*3/uL (ref 0.0–0.5)
EOS%: 2.1 % (ref 0.0–7.0)
HCT: 32.1 % — ABNORMAL LOW (ref 34.8–46.6)
HGB: 10.8 g/dL — ABNORMAL LOW (ref 11.6–15.9)
LYMPH%: 46.2 % (ref 14.0–49.7)
MCH: 27.4 pg (ref 25.1–34.0)
MCHC: 33.6 g/dL (ref 31.5–36.0)
MCV: 81.5 fL (ref 79.5–101.0)
MONO#: 0.3 10*3/uL (ref 0.1–0.9)
MONO%: 7.4 % (ref 0.0–14.0)
NEUT%: 44.3 % (ref 38.4–76.8)
NEUTROS ABS: 1.9 10*3/uL (ref 1.5–6.5)
PLATELETS: 128 10*3/uL — AB (ref 145–400)
RBC: 3.94 10*6/uL (ref 3.70–5.45)
RDW: 16.6 % — ABNORMAL HIGH (ref 11.2–14.5)
WBC: 4.3 10*3/uL (ref 3.9–10.3)
lymph#: 2 10*3/uL (ref 0.9–3.3)

## 2016-09-02 LAB — IRON AND TIBC
%SAT: 38 % (ref 21–57)
Iron: 97 ug/dL (ref 41–142)
TIBC: 253 ug/dL (ref 236–444)
UIBC: 156 ug/dL (ref 120–384)

## 2016-09-02 LAB — FERRITIN: Ferritin: 119 ng/ml (ref 9–269)

## 2016-09-02 MED ORDER — DARBEPOETIN ALFA 300 MCG/0.6ML IJ SOSY
300.0000 ug | PREFILLED_SYRINGE | INTRAMUSCULAR | Status: DC
  Administered 2016-09-02: 300 ug via SUBCUTANEOUS
  Filled 2016-09-02: qty 0.6

## 2016-09-02 NOTE — Patient Instructions (Signed)
Darbepoetin Alfa injection What is this medicine? DARBEPOETIN ALFA (dar be POE e tin AL fa) helps your body make more red blood cells. It is used to treat anemia caused by chronic kidney failure and chemotherapy. This medicine may be used for other purposes; ask your health care provider or pharmacist if you have questions. COMMON BRAND NAME(S): Aranesp What should I tell my health care provider before I take this medicine? They need to know if you have any of these conditions: -blood clotting disorders or history of blood clots -cancer patient not on chemotherapy -cystic fibrosis -heart disease, such as angina, heart failure, or a history of a heart attack -hemoglobin level of 12 g/dL or greater -high blood pressure -low levels of folate, iron, or vitamin B12 -seizures -an unusual or allergic reaction to darbepoetin, erythropoietin, albumin, hamster proteins, latex, other medicines, foods, dyes, or preservatives -pregnant or trying to get pregnant -breast-feeding How should I use this medicine? This medicine is for injection into a vein or under the skin. It is usually given by a health care professional in a hospital or clinic setting. If you get this medicine at home, you will be taught how to prepare and give this medicine. Do not shake the solution before you withdraw a dose. Use exactly as directed. Take your medicine at regular intervals. Do not take your medicine more often than directed. It is important that you put your used needles and syringes in a special sharps container. Do not put them in a trash can. If you do not have a sharps container, call your pharmacist or healthcare provider to get one. Talk to your pediatrician regarding the use of this medicine in children. While this medicine may be used in children as young as 1 year for selected conditions, precautions do apply. Overdosage: If you think you have taken too much of this medicine contact a poison control center or  emergency room at once. NOTE: This medicine is only for you. Do not share this medicine with others. What if I miss a dose? If you miss a dose, take it as soon as you can. If it is almost time for your next dose, take only that dose. Do not take double or extra doses. What may interact with this medicine? Do not take this medicine with any of the following medications: -epoetin alfa This list may not describe all possible interactions. Give your health care provider a list of all the medicines, herbs, non-prescription drugs, or dietary supplements you use. Also tell them if you smoke, drink alcohol, or use illegal drugs. Some items may interact with your medicine. What should I watch for while using this medicine? Visit your prescriber or health care professional for regular checks on your progress and for the needed blood tests and blood pressure measurements. It is especially important for the doctor to make sure your hemoglobin level is in the desired range, to limit the risk of potential side effects and to give you the best benefit. Keep all appointments for any recommended tests. Check your blood pressure as directed. Ask your doctor what your blood pressure should be and when you should contact him or her. As your body makes more red blood cells, you may need to take iron, folic acid, or vitamin B supplements. Ask your doctor or health care provider which products are right for you. If you have kidney disease continue dietary restrictions, even though this medication can make you feel better. Talk with your doctor or health   care professional about the foods you eat and the vitamins that you take. What side effects may I notice from receiving this medicine? Side effects that you should report to your doctor or health care professional as soon as possible: -allergic reactions like skin rash, itching or hives, swelling of the face, lips, or tongue -breathing problems -changes in vision -chest  pain -confusion, trouble speaking or understanding -feeling faint or lightheaded, falls -high blood pressure -muscle aches or pains -pain, swelling, warmth in the leg -rapid weight gain -severe headaches -sudden numbness or weakness of the face, arm or leg -trouble walking, dizziness, loss of balance or coordination -seizures (convulsions) -swelling of the ankles, feet, hands -unusually weak or tired Side effects that usually do not require medical attention (report to your doctor or health care professional if they continue or are bothersome): -diarrhea -fever, chills (flu-like symptoms) -headaches -nausea, vomiting -redness, stinging, or swelling at site where injected This list may not describe all possible side effects. Call your doctor for medical advice about side effects. You may report side effects to FDA at 1-800-FDA-1088. Where should I keep my medicine? Keep out of the reach of children. Store in a refrigerator between 2 and 8 degrees C (36 and 46 degrees F). Do not freeze. Do not shake. Throw away any unused portion if using a single-dose vial. Throw away any unused medicine after the expiration date. NOTE: This sheet is a summary. It may not cover all possible information. If you have questions about this medicine, talk to your doctor, pharmacist, or health care provider.  2015, [employer]. (2008-09-02 10:23:57)  

## 2016-09-23 ENCOUNTER — Other Ambulatory Visit (HOSPITAL_BASED_OUTPATIENT_CLINIC_OR_DEPARTMENT_OTHER): Payer: Medicare Other

## 2016-09-23 ENCOUNTER — Ambulatory Visit: Payer: Medicare Other

## 2016-09-23 DIAGNOSIS — N183 Chronic kidney disease, stage 3 unspecified: Secondary | ICD-10-CM

## 2016-09-23 DIAGNOSIS — D631 Anemia in chronic kidney disease: Secondary | ICD-10-CM | POA: Diagnosis not present

## 2016-09-23 DIAGNOSIS — D509 Iron deficiency anemia, unspecified: Secondary | ICD-10-CM

## 2016-09-23 DIAGNOSIS — N189 Chronic kidney disease, unspecified: Secondary | ICD-10-CM

## 2016-09-23 LAB — CBC WITH DIFFERENTIAL/PLATELET
BASO%: 0.3 % (ref 0.0–2.0)
Basophils Absolute: 0 10*3/uL (ref 0.0–0.1)
EOS ABS: 0.1 10*3/uL (ref 0.0–0.5)
EOS%: 3.1 % (ref 0.0–7.0)
HEMATOCRIT: 32.7 % — AB (ref 34.8–46.6)
HGB: 11.1 g/dL — ABNORMAL LOW (ref 11.6–15.9)
LYMPH#: 1.9 10*3/uL (ref 0.9–3.3)
LYMPH%: 52.4 % — AB (ref 14.0–49.7)
MCH: 28.2 pg (ref 25.1–34.0)
MCHC: 33.9 g/dL (ref 31.5–36.0)
MCV: 83 fL (ref 79.5–101.0)
MONO#: 0.3 10*3/uL (ref 0.1–0.9)
MONO%: 8.7 % (ref 0.0–14.0)
NEUT%: 35.5 % — AB (ref 38.4–76.8)
NEUTROS ABS: 1.3 10*3/uL — AB (ref 1.5–6.5)
PLATELETS: 108 10*3/uL — AB (ref 145–400)
RBC: 3.94 10*6/uL (ref 3.70–5.45)
RDW: 17 % — ABNORMAL HIGH (ref 11.2–14.5)
WBC: 3.6 10*3/uL — ABNORMAL LOW (ref 3.9–10.3)
nRBC: 0 % (ref 0–0)

## 2016-09-23 LAB — IRON AND TIBC
%SAT: 39 % (ref 21–57)
IRON: 97 ug/dL (ref 41–142)
TIBC: 250 ug/dL (ref 236–444)
UIBC: 153 ug/dL (ref 120–384)

## 2016-09-23 LAB — FERRITIN: FERRITIN: 52 ng/mL (ref 9–269)

## 2016-09-23 NOTE — Progress Notes (Signed)
Hemoglobin noted at 11.1 today. No injection given per order protocol. Pt given copy of labs and schedule. Instructed to follow schedule and call office should issues occur. Pt verbalized  understanding of instructions.

## 2016-10-14 ENCOUNTER — Other Ambulatory Visit (HOSPITAL_BASED_OUTPATIENT_CLINIC_OR_DEPARTMENT_OTHER): Payer: Medicare Other

## 2016-10-14 ENCOUNTER — Ambulatory Visit: Payer: Medicare Other

## 2016-10-14 ENCOUNTER — Other Ambulatory Visit: Payer: Self-pay | Admitting: Internal Medicine

## 2016-10-14 DIAGNOSIS — N183 Chronic kidney disease, stage 3 unspecified: Secondary | ICD-10-CM

## 2016-10-14 DIAGNOSIS — D631 Anemia in chronic kidney disease: Secondary | ICD-10-CM

## 2016-10-14 DIAGNOSIS — N189 Chronic kidney disease, unspecified: Secondary | ICD-10-CM

## 2016-10-14 DIAGNOSIS — I10 Essential (primary) hypertension: Secondary | ICD-10-CM

## 2016-10-14 LAB — CBC WITH DIFFERENTIAL/PLATELET
BASO%: 0.6 % (ref 0.0–2.0)
BASOS ABS: 0 10*3/uL (ref 0.0–0.1)
EOS%: 2.1 % (ref 0.0–7.0)
Eosinophils Absolute: 0.1 10*3/uL (ref 0.0–0.5)
HCT: 32.6 % — ABNORMAL LOW (ref 34.8–46.6)
HGB: 11.1 g/dL — ABNORMAL LOW (ref 11.6–15.9)
LYMPH%: 39.5 % (ref 14.0–49.7)
MCH: 28.6 pg (ref 25.1–34.0)
MCHC: 33.9 g/dL (ref 31.5–36.0)
MCV: 84.5 fL (ref 79.5–101.0)
MONO#: 0.3 10*3/uL (ref 0.1–0.9)
MONO%: 8.1 % (ref 0.0–14.0)
NEUT#: 2.1 10*3/uL (ref 1.5–6.5)
NEUT%: 49.7 % (ref 38.4–76.8)
Platelets: 174 10*3/uL (ref 145–400)
RBC: 3.86 10*6/uL (ref 3.70–5.45)
RDW: 14.8 % — ABNORMAL HIGH (ref 11.2–14.5)
WBC: 4.1 10*3/uL (ref 3.9–10.3)
lymph#: 1.6 10*3/uL (ref 0.9–3.3)

## 2016-10-14 NOTE — Progress Notes (Signed)
Hemoglobin on lab today was 11.1, no injection needed per order protocol. Pt given copy of  Current labs and current schedule. Instructed to follow schedule and call office should issues occur. Pt verbalized understanding of instructions.

## 2016-10-19 ENCOUNTER — Ambulatory Visit: Payer: Self-pay | Admitting: Internal Medicine

## 2016-10-31 ENCOUNTER — Ambulatory Visit: Payer: Self-pay | Admitting: Internal Medicine

## 2016-11-01 ENCOUNTER — Encounter: Payer: Self-pay | Admitting: Internal Medicine

## 2016-11-01 ENCOUNTER — Ambulatory Visit (INDEPENDENT_AMBULATORY_CARE_PROVIDER_SITE_OTHER): Payer: Medicare Other | Admitting: Internal Medicine

## 2016-11-01 VITALS — BP 128/80 | HR 66 | Temp 98.0°F | Resp 14 | Ht 62.0 in | Wt 166.0 lb

## 2016-11-01 DIAGNOSIS — I10 Essential (primary) hypertension: Secondary | ICD-10-CM | POA: Diagnosis not present

## 2016-11-01 DIAGNOSIS — D631 Anemia in chronic kidney disease: Secondary | ICD-10-CM

## 2016-11-01 DIAGNOSIS — M7671 Peroneal tendinitis, right leg: Secondary | ICD-10-CM

## 2016-11-01 DIAGNOSIS — N183 Chronic kidney disease, stage 3 unspecified: Secondary | ICD-10-CM

## 2016-11-01 DIAGNOSIS — E782 Mixed hyperlipidemia: Secondary | ICD-10-CM

## 2016-11-01 DIAGNOSIS — N189 Chronic kidney disease, unspecified: Secondary | ICD-10-CM

## 2016-11-01 DIAGNOSIS — D469 Myelodysplastic syndrome, unspecified: Secondary | ICD-10-CM | POA: Diagnosis not present

## 2016-11-01 DIAGNOSIS — E559 Vitamin D deficiency, unspecified: Secondary | ICD-10-CM

## 2016-11-01 MED ORDER — PREDNISONE 20 MG PO TABS: ORAL_TABLET | ORAL | 0 refills | Status: DC

## 2016-11-01 NOTE — Progress Notes (Signed)
Assessment and Plan:  Hypertension:  -well controlled today -Continue medication -monitor blood pressure at home. -Continue DASH diet -Reminder to go to the ER if any CP, SOB, nausea, dizziness, severe HA, changes vision/speech, left arm numbness and tingling and jaw pain.  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 Peroneal tendonitis -right foot xray -prednisone -already on daily antiiflammatory medication.  Continue diet and meds as discussed. Further disposition pending results of labs. Discussed med's effects and SE's.    HPI 76 y.o. female  presents for 3 month follow up with hypertension, hyperlipidemia, diabetes and vitamin D deficiency.   Her blood pressure has been controlled at home, today their BP is BP: 128/80.She is not working out currently.  She reports that this is mainly due to her foot pain. workout. She denies chest pain, shortness of breath, dizziness.  She is taking her medications daily at home. She does occasionally get the high reading but then it returns to normal.     She is not on cholesterol medication and denies myalgias. Her cholesterol is at goal. The cholesterol was:  07/27/2016: Cholesterol 184; HDL 61; LDL Cholesterol 98; Triglycerides 126   Patient is on Vitamin D supplement. 04/11/2016: Vit D, 25-Hydroxy 48  She notes that she has been having right foot pain which has been going on for a week.  She reports that her foot has been swelling and hurts when she walks  on it.  She has been elevating.  She has been taking it twice daily.  She reports that she has been taking extra strength tylenol as well.   NO issues with her foot in the past.  NO injury. She was told in the past she has had gout in the past.  She reports that this does not feel like gout in the past.    She is still following with nephrology for her CKD 3 and is also receiving aranesp from them as needed.    She is also following with hematology as needed for her MDS.  She goes weekly for blood counts.    Current Medications:  Current Outpatient Prescriptions on File Prior to Visit  Medication Sig Dispense Refill  . amLODipine (NORVASC) 10 MG tablet TAKE 1/2 TO 1 TABLET BY MOUTH EVERY DAY FOR BLOOD PRESSURE 30 tablet 3  . aspirin 81 MG chewable tablet Chew 81 mg by mouth every morning.     [name] azelastine (ASTELIN) 0.1 % nasal spray Place 2 sprays into both nostrils 2 (two) times daily. Use in each nostril as directed 30 mL 2  . Cetirizine HCl 10 MG CAPS Take 1 capsule (10 mg total) by mouth at bedtime. 90 capsule 0  . Cholecalciferol (VITAMIN D-3) 5000 UNITS TABS Take 15,000 Units by mouth daily.     . Darbepoetin Alfa-Albumin (ARANESP IJ) Inject as directed every 21 ( twenty-one) days.     . diclofenac (VOLTAREN) 75 MG EC tablet TAKE 1 TABLET(75 MG) BY MOUTH TWICE DAILY 60 tablet 5  .  diclofenac sodium (VOLTAREN) 1 % GEL Apply 2 g topically 4 (four) times daily. 100 g 2  . diphenoxylate-atropine (LOMOTIL) 2.5-0.025 MG tablet Take 2 tablets by mouth 4 (four) times daily as needed for diarrhea or loose stools. 30 tablet 0  . ipratropium (ATROVENT) 0.03 % nasal spray Take 1 to 2 sprays into each nostril up to 2 or 3 x day as needed 30 mL 99  . labetalol (NORMODYNE) 300 MG tablet TAKE 1 TABLET BY MOUTH TWICE DAILY 180 tablet 0  . levothyroxine (SYNTHROID, LEVOTHROID) 100 MCG tablet TAKE 1 TABLET BY MOUTH DAILY ON AN EMPTY STOMACH FOR 30 MINUTES 90 tablet 3  . losartan-hydrochlorothiazide (HYZAAR)  100-25 MG per tablet Take 1 tablet daily for BP (Patient taking differently: Take 0.5 tablets by mouth daily. ) 90 tablet 1  . pantoprazole (PROTONIX) 40 MG tablet TAKE 1 TABLET BY MOUTH EVERY DAY FOR ACID REFLUX 90 tablet 1   No current facility-administered medications on file prior to visit.    Medical History:  Past Medical History:  Diagnosis Date  . Anemia   . Arthritis   . GERD (gastroesophageal reflux disease)   . HLD (hyperlipidemia)   . Hypertension   . Hypothyroidism   . Myelodysplasia   . Vitamin D deficiency    Allergies:     Review of Systems:  Review of Systems  Constitutional: Negative for chills, fever and malaise/fatigue.  HENT: Negative for congestion, ear pain and sore throat.   Eyes: Negative.   Respiratory: Negative for cough, shortness of breath and wheezing.   Cardiovascular: Negative for chest pain, palpitations and leg swelling.  Gastrointestinal: Negative for abdominal pain, blood in stool, constipation, diarrhea, heartburn and melena.  Genitourinary: Negative.   Skin: Negative.   Neurological: Negative for dizziness, sensory change, loss of consciousness and headaches.  Psychiatric/Behavioral: Negative for depression. The patient is not nervous/anxious and does not have insomnia.     Family history- Review and unchanged  Social history- Review and unchanged  Physical Exam: BP 128/80   Pulse 66   Temp 98 F (36.7 C) (Temporal)   Resp 14   Ht 5\' 2"  (1.575 m)   Wt 166 lb (75.3 kg)   BMI 30.36 kg/m  Wt Readings from Last 3 Encounters:  11/01/16 166 lb (75.3 kg)  08/12/16 168 lb 3.2 oz (76.3 kg)  07/27/16 170 lb (77.1 kg)   General Appearance: Well nourished well developed, non-toxic appearing, in no apparent distress. Eyes: PERRLA, EOMs, conjunctiva no swelling or erythema ENT/Mouth: Ear canals clear with no erythema, swelling, or discharge.  TMs normal bilaterally, oropharynx clear, moist, with no exudate.   Neck: Supple, thyroid  normal, no JVD, no cervical adenopathy.  Respiratory: Respiratory effort normal, breath sounds clear A&P, no wheeze, rhonchi or rales noted.  No retractions, no accessory muscle usage Cardio: RRR with no MRGs. No noted edema.  Abdomen: Soft, + BS.  Non tender, no guarding, rebound, hernias, masses. Musculoskeletal: Full ROM, 5/5 strength, Antalgic gait to the right.  Right foot with mild swelling to the midfoot.  No bilateral malleoli tenderness to palpation.  Some ATF tenderness to palpation.  NO CF or PTF tenderness.  Some 5th metatarsal tenderness to palpation.  Active ROM limited.  Passive ROM limited due to pain.  There is pain with active eversion and flexion of the foot.   Skin: Warm, dry without rashes, lesions, ecchymosis.  Neuro: Awake and oriented X 3, Cranial nerves intact. No cerebellar symptoms.  Psych: normal  affect, Insight and Judgment appropriate.    Starlyn Skeans, PA-C 3:50 PM [facility]

## 2016-11-01 NOTE — Patient Instructions (Signed)
Please buy ASO ankle splint.     Peroneal Tendinopathy Peroneal tendinopathy is irritation of the tendons that pass behind your ankle (peroneal tendons). These tendons attach muscles in your foot to a bone on the side of your foot and underneath the arch of your foot. This condition can cause your peroneal tendons to get bigger and swell. What are the causes? This condition may be caused by:  Putting stress on your ankle over and over again (overuse injury).  A sudden injury that puts stress on your tendons, such as an ankle sprain. What increases the risk? This condition is more likely to develop in:  People who have high arches.  Athletes who play sports that involve putting stress on the ankle over and over again. These sports include:  Running.  Dancing.  Soccer.  Basketball. What are the signs or symptoms? Symptoms of this condition can start suddenly or develop gradually. Symptoms include:  Pain in the back of the ankle, on the side of the foot, or in the arch of the foot.  Pain that gets worse with activity and better with rest.  Swelling.  Warmth.  Weakness in your foot or ankle. How is this diagnosed? This condition may be diagnosed based on:  Your symptoms.  Your medical history.  A physical exam.  Imaging tests, such as:  An X-ray or CT scan to check for bone injury.  MRI or ultrasound to check for muscle or tendon injury. During your physical exam, your health care provider may move your foot and ankle and test the strength of your leg muscles. How is this treated? This condition may be treated by:  Keeping your body weight off your ankle for several days.  Returning gradually to full activity gradually.  Putting ice on your ankle to reduce swelling.  Taking an anti-inflammatory pain medicine (NSAID).  Having medicine injected into your tendon to reduce swelling.  Wearing a removable boot or brace for ankle support.  Doing range-of-motion  exercises and strengthening exercises (physical therapy) when pain and swelling improve. If the condition does not improve with treatment, or if a tendon or muscle is damaged, surgery may be needed. Follow these instructions at home: If you have a boot or brace:  Wear it as told by your health care provider. Remove it only as told by your health care provider.  Loosen it if your toes tingle, become numb, or turn cold and blue.  Do not let it get wet if it is not waterproof.  Keep it clean. Managing pain, stiffness, and swelling  If directed, apply ice to the injured area:  Put ice in a plastic bag.  Place a towel between your skin and the bag.  Leave the ice on for 20 minutes, 2-3 times a day.  Take over-the-counter and prescription medicines only as told by your health care provider.  Raise (elevate) your ankle above the level of your heart when resting if you have swelling. Activity  Do not use your ankle to support (bear) your full body weight until your health care provider says that you can.  Do not do activities that make pain or swelling worse.  Return to your normal activities as told by your health care provider. General instructions  Keep all follow-up visits as told by your health care provider. This is important. How is this prevented?  Wear supportive footwear that is appropriate for your athletic activity.  Avoid athletic activities that cause swelling or pain in your ankle  or foot.  See your health care provider if you have pain or swelling that does not improve after a few days of rest.  Stop training if you develop pain or swelling.  If you start a new athletic activity, start gradually to build up your strength, endurance, and flexibility. Contact a health care provider if:  Your symptoms get worse.  Your symptoms do not improve in 2-4 weeks.  You develop new, unexplained symptoms. This information is not intended to replace advice given to you  by your health care provider. Make sure you discuss any questions you have with your health care provider. Document Released: 09/19/2005 Document Revised: 05/24/2016 Document Reviewed: 08/08/2015 Elsevier Interactive Patient Education  2017 [employer].

## 2016-11-04 ENCOUNTER — Other Ambulatory Visit (HOSPITAL_BASED_OUTPATIENT_CLINIC_OR_DEPARTMENT_OTHER): Payer: Medicare Other

## 2016-11-04 ENCOUNTER — Ambulatory Visit (HOSPITAL_BASED_OUTPATIENT_CLINIC_OR_DEPARTMENT_OTHER): Payer: Medicare Other

## 2016-11-04 ENCOUNTER — Ambulatory Visit (HOSPITAL_COMMUNITY)
Admission: RE | Admit: 2016-11-04 | Discharge: 2016-11-04 | Disposition: A | Discharge status: Home / Self Care | Payer: Medicare Other | Source: Ambulatory Visit | Attending: Internal Medicine | Admitting: Internal Medicine

## 2016-11-04 ENCOUNTER — Other Ambulatory Visit: Payer: Self-pay | Admitting: *Deleted

## 2016-11-04 VITALS — BP 144/78 | HR 66 | Temp 98.2°F | Resp 16

## 2016-11-04 DIAGNOSIS — M25571 Pain in right ankle and joints of right foot: Secondary | ICD-10-CM

## 2016-11-04 DIAGNOSIS — D631 Anemia in chronic kidney disease: Secondary | ICD-10-CM | POA: Diagnosis not present

## 2016-11-04 DIAGNOSIS — M7989 Other specified soft tissue disorders: Secondary | ICD-10-CM | POA: Insufficient documentation

## 2016-11-04 DIAGNOSIS — N189 Chronic kidney disease, unspecified: Secondary | ICD-10-CM

## 2016-11-04 DIAGNOSIS — M79671 Pain in right foot: Secondary | ICD-10-CM | POA: Insufficient documentation

## 2016-11-04 DIAGNOSIS — M2011 Hallux valgus (acquired), right foot: Secondary | ICD-10-CM | POA: Diagnosis not present

## 2016-11-04 DIAGNOSIS — D509 Iron deficiency anemia, unspecified: Secondary | ICD-10-CM

## 2016-11-04 DIAGNOSIS — N183 Chronic kidney disease, stage 3 unspecified: Secondary | ICD-10-CM

## 2016-11-04 LAB — CBC WITH DIFFERENTIAL/PLATELET
BASO%: 0.4 % (ref 0.0–2.0)
Basophils Absolute: 0 10*3/uL (ref 0.0–0.1)
EOS ABS: 0.1 10*3/uL (ref 0.0–0.5)
EOS%: 1.9 % (ref 0.0–7.0)
HCT: 27.5 % — ABNORMAL LOW (ref 34.8–46.6)
HGB: 9.6 g/dL — ABNORMAL LOW (ref 11.6–15.9)
LYMPH%: 32.5 % (ref 14.0–49.7)
MCH: 29.3 pg (ref 25.1–34.0)
MCHC: 34.8 g/dL (ref 31.5–36.0)
MCV: 84.3 fL (ref 79.5–101.0)
MONO#: 0.4 10*3/uL (ref 0.1–0.9)
MONO%: 6.9 % (ref 0.0–14.0)
NEUT%: 58.3 % (ref 38.4–76.8)
NEUTROS ABS: 3.3 10*3/uL (ref 1.5–6.5)
Platelets: 123 10*3/uL — ABNORMAL LOW (ref 145–400)
RBC: 3.26 10*6/uL — AB (ref 3.70–5.45)
RDW: 14.9 % — ABNORMAL HIGH (ref 11.2–14.5)
WBC: 5.6 10*3/uL (ref 3.9–10.3)
lymph#: 1.8 10*3/uL (ref 0.9–3.3)

## 2016-11-04 MED ORDER — DARBEPOETIN ALFA 300 MCG/0.6ML IJ SOSY
300.0000 ug | PREFILLED_SYRINGE | INTRAMUSCULAR | Status: DC
  Administered 2016-11-04: 300 ug via SUBCUTANEOUS
  Filled 2016-11-04: qty 0.6

## 2016-11-11 ENCOUNTER — Other Ambulatory Visit: Payer: Self-pay | Admitting: Internal Medicine

## 2016-11-21 MED ORDER — FENTANYL CITRATE (PF) 100 MCG/2ML IJ SOLN
INTRAMUSCULAR | Status: AC
  Filled 2016-11-21: qty 4

## 2016-11-21 MED ORDER — FLUMAZENIL 0.5 MG/5ML IV SOLN
INTRAVENOUS | Status: AC
  Filled 2016-11-21: qty 5

## 2016-11-21 MED ORDER — MIDAZOLAM HCL 2 MG/2ML IJ SOLN
INTRAMUSCULAR | Status: AC
  Filled 2016-11-21: qty 4

## 2016-11-21 MED ORDER — NALOXONE HCL 0.4 MG/ML IJ SOLN
INTRAMUSCULAR | Status: AC
  Filled 2016-11-21: qty 1

## 2016-11-25 ENCOUNTER — Encounter (INDEPENDENT_AMBULATORY_CARE_PROVIDER_SITE_OTHER): Payer: Self-pay

## 2016-11-25 ENCOUNTER — Ambulatory Visit (HOSPITAL_BASED_OUTPATIENT_CLINIC_OR_DEPARTMENT_OTHER): Payer: Medicare Other

## 2016-11-25 ENCOUNTER — Other Ambulatory Visit (HOSPITAL_BASED_OUTPATIENT_CLINIC_OR_DEPARTMENT_OTHER): Payer: Medicare Other

## 2016-11-25 ENCOUNTER — Telehealth: Payer: Self-pay | Admitting: Oncology

## 2016-11-25 ENCOUNTER — Other Ambulatory Visit: Payer: Medicare Other

## 2016-11-25 ENCOUNTER — Ambulatory Visit: Payer: Medicare Other

## 2016-11-25 ENCOUNTER — Ambulatory Visit (HOSPITAL_BASED_OUTPATIENT_CLINIC_OR_DEPARTMENT_OTHER): Payer: Medicare Other | Admitting: Oncology

## 2016-11-25 VITALS — BP 139/77 | HR 74 | Temp 98.4°F | Resp 18 | Ht 62.0 in | Wt 170.8 lb

## 2016-11-25 DIAGNOSIS — N189 Chronic kidney disease, unspecified: Principal | ICD-10-CM

## 2016-11-25 DIAGNOSIS — N183 Chronic kidney disease, stage 3 unspecified: Secondary | ICD-10-CM

## 2016-11-25 DIAGNOSIS — D631 Anemia in chronic kidney disease: Secondary | ICD-10-CM | POA: Diagnosis not present

## 2016-11-25 LAB — CBC WITH DIFFERENTIAL/PLATELET
BASO%: 0 % (ref 0.0–2.0)
BASOS ABS: 0 10*3/uL (ref 0.0–0.1)
EOS ABS: 0.1 10*3/uL (ref 0.0–0.5)
EOS%: 1.4 % (ref 0.0–7.0)
HEMATOCRIT: 29.1 % — AB (ref 34.8–46.6)
HEMOGLOBIN: 9.7 g/dL — AB (ref 11.6–15.9)
LYMPH#: 1.4 10*3/uL (ref 0.9–3.3)
LYMPH%: 39.6 % (ref 14.0–49.7)
MCH: 29.5 pg (ref 25.1–34.0)
MCHC: 33.3 g/dL (ref 31.5–36.0)
MCV: 88.4 fL (ref 79.5–101.0)
MONO#: 0.5 10*3/uL (ref 0.1–0.9)
MONO%: 14.6 % — ABNORMAL HIGH (ref 0.0–14.0)
NEUT%: 44.4 % (ref 38.4–76.8)
NEUTROS ABS: 1.6 10*3/uL (ref 1.5–6.5)
Platelets: 99 10*3/uL — ABNORMAL LOW (ref 145–400)
RBC: 3.29 10*6/uL — ABNORMAL LOW (ref 3.70–5.45)
RDW: 17.4 % — AB (ref 11.2–14.5)
WBC: 3.6 10*3/uL — AB (ref 3.9–10.3)

## 2016-11-25 MED ORDER — DARBEPOETIN ALFA 300 MCG/0.6ML IJ SOSY
300.0000 ug | PREFILLED_SYRINGE | INTRAMUSCULAR | Status: DC
  Administered 2016-11-25: 300 ug via SUBCUTANEOUS
  Filled 2016-11-25: qty 0.6

## 2016-11-25 NOTE — Patient Instructions (Signed)

## 2016-11-25 NOTE — Progress Notes (Signed)
Hematology and Oncology Follow Up Visit  Susan [name] [id number] 09/09/1941 76 y.o. 11/25/2016 9:00 AM Susan [name], MDMcKeown, Susan Saxon, MD   Principle Diagnosis:  76 year old woman with anemia of renal disease diagnosed in 2010. She also has element of early myelodysplasia.  Secondary Diagnosis: Controlled hypertension.  Current therapy: She is on Aranesp 300 mcg every 3 weeks to keep her hemoglobin above 11.  Interim History:  Susan [name] presents today for a follow-up visit. Since her last visit, she reports no major complaints. She did wake up with a sinus congestion and slight dizziness today but for the most part have been reasonably feeling healthy. She denied any fevers, chills or sweats. She denied any joint pain or arthralgias.  She continues to tolerate Aranesp without any major complications. She denied any hematochezia or melena. Her blood pressure has been reasonably controlled without any major problems associated with Aranesp. She feels better after receiving these injections with improvement in her performance status and decrease dyspnea.   She does not report any headaches, blurry vision, syncope or seizures. She denies bleeding, bruising or lymphadenopathy. He has not reported any fevers or chills or sweats. She has not reported any chest pain or palpitation. Does not report any shortness of breath or wheezing. She does not report any abdominal pain or early satiety. She does not report any hematochezia or melena.  Rest of her review of systems unremarkable.  Medications: I have reviewed the patient's current medications.  Current Outpatient Prescriptions  Medication Sig Dispense Refill  . amLODipine (NORVASC) 10 MG tablet TAKE 1/2 TO 1 TABLET BY MOUTH EVERY DAY FOR BLOOD PRESSURE 30 tablet 3  . aspirin 81 MG chewable tablet Chew 81 mg by mouth every morning.     . Cetirizine HCl 10 MG CAPS Take 1 capsule (10 mg total) by mouth at bedtime. 90 capsule 0  .  Cholecalciferol (VITAMIN D-3) 5000 UNITS TABS Take 15,000 Units by mouth daily.     . Darbepoetin Alfa-Albumin (ARANESP IJ) Inject as directed every 21 ( twenty-one) days.     . diclofenac (VOLTAREN) 75 MG EC tablet TAKE 1 TABLET(75 MG) BY MOUTH TWICE DAILY 60 tablet 5  . diphenoxylate-atropine (LOMOTIL) 2.5-0.025 MG tablet Take 2 tablets by mouth 4 (four) times daily as needed for diarrhea or loose stools. 30 tablet 0  . ipratropium (ATROVENT) 0.03 % nasal spray Take 1 to 2 sprays into each nostril up to 2 or 3 x day as needed 30 mL 99  . labetalol (NORMODYNE) 300 MG tablet TAKE 1 TABLET BY MOUTH TWICE DAILY 180 tablet 0  . levothyroxine (SYNTHROID, LEVOTHROID) 100 MCG tablet TAKE 1 TABLET BY MOUTH DAILY ON AN EMPTY STOMACH FOR 30 MINUTES 90 tablet 3  . losartan-hydrochlorothiazide (HYZAAR) 100-25 MG per tablet Take 1 tablet daily for BP (Patient taking differently: Take 0.5 tablets by mouth daily. ) 90 tablet 1  . pantoprazole (PROTONIX) 40 MG tablet TAKE 1 TABLET BY MOUTH EVERY DAY FOR ACID REFLUX 90 tablet 0  . azelastine (ASTELIN) 0.1 % nasal spray Place 2 sprays into both nostrils 2 (two) times daily. Use in each nostril as directed 30 mL 2   No current facility-administered medications for this visit.      Past Medical History, Surgical history, Social history, and Family History were reviewed and updated.  [name]  Physical Exam: Blood pressure 139/77, pulse 74, temperature 98.4 F (36.9 C), temperature source Oral, resp. rate 18, height _0  (1.575 m), weight 170 lb  12.8 oz (77.5 kg), SpO2 100 %. ECOG: 1 General appearance:Well-appearing woman appeared without distress. Head: Normocephalic, without obvious abnormality no oral thrush noted. Neck: no adenopathy Lymph nodes: Cervical, supraclavicular, and axillary nodes normal. Heart:regular rate and rhythm, S1, S2 normal, no murmur, click, rub or gallop Lung:chest clear, no wheezing, rales, normal symmetric air entry Abdomen: soft,  non-tender, without masses or organomegaly no shifting dullness or ascites.   Lab Results: Lab Results  Component Value Date   WBC 3.6 (L) 11/25/2016   HGB 9.7 (L) 11/25/2016   HCT 29.1 (L) 11/25/2016   MCV 88.4 11/25/2016   PLT 99 (L) 11/25/2016     Chemistry      Component Value Date/Time   NA 140 07/27/2016 1146   K 3.6 07/27/2016 1146   CL 107 07/27/2016 1146   CO2 24 07/27/2016 1146   BUN 22 07/27/2016 1146   CREATININE 1.34 (H) 07/27/2016 1146      Component Value Date/Time   CALCIUM 9.4 07/27/2016 1146   ALKPHOS 82 07/27/2016 1146   AST 15 07/27/2016 1146   ALT 8 07/27/2016 1146   BILITOT 0.6 07/27/2016 1146      Impression and Plan:   76 year old female with the following issues:  1. 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 She is currently receiving Aranesp every 3 weeks to keep her hemoglobin above 11 where she functions the best. Her hemoglobin is 9.7 and she will receive Aranesp today. Iron studies will continue to be checked periodically and replace as needed. 2. Hypertension:  Her blood pressure is normal without any major changes. 3. History of questionable myelodysplasia: No evidence of cytopenias and noted other than her anemia. We will continue to monitor her regarding this issue. Repeat bone marrow biopsy may be needed in the future. 4. Follow up. Every 3 weeks for CBC and injection. Clinical visit in 3 months.       [facility] MD 2/23/20189:00 AM

## 2016-11-28 ENCOUNTER — Other Ambulatory Visit: Payer: Self-pay | Admitting: Internal Medicine

## 2016-11-28 DIAGNOSIS — M25571 Pain in right ankle and joints of right foot: Secondary | ICD-10-CM

## 2016-11-28 NOTE — Progress Notes (Signed)
Patient continuing to have bad swelling in her ankle which restarted again after finishing her prednisone.  We will place referral to see orthopedics for right ankle pain

## 2016-12-02 DIAGNOSIS — S92355A Nondisplaced fracture of fifth metatarsal bone, left foot, initial encounter for closed fracture: Secondary | ICD-10-CM | POA: Diagnosis not present

## 2016-12-03 ENCOUNTER — Other Ambulatory Visit: Payer: Self-pay | Admitting: Physician Assistant

## 2016-12-16 ENCOUNTER — Ambulatory Visit: Payer: Medicare Other

## 2016-12-16 ENCOUNTER — Other Ambulatory Visit (HOSPITAL_BASED_OUTPATIENT_CLINIC_OR_DEPARTMENT_OTHER): Payer: Medicare Other

## 2016-12-16 DIAGNOSIS — D631 Anemia in chronic kidney disease: Secondary | ICD-10-CM | POA: Diagnosis not present

## 2016-12-16 DIAGNOSIS — N189 Chronic kidney disease, unspecified: Secondary | ICD-10-CM | POA: Diagnosis present

## 2016-12-16 LAB — CBC WITH DIFFERENTIAL/PLATELET
BASO%: 0 % (ref 0.0–2.0)
Basophils Absolute: 0 10*3/uL (ref 0.0–0.1)
EOS ABS: 0.1 10*3/uL (ref 0.0–0.5)
EOS%: 2.5 % (ref 0.0–7.0)
HCT: 34.4 % — ABNORMAL LOW (ref 34.8–46.6)
HGB: 11.7 g/dL (ref 11.6–15.9)
LYMPH%: 56.7 % — AB (ref 14.0–49.7)
MCH: 29.3 pg (ref 25.1–34.0)
MCHC: 34 g/dL (ref 31.5–36.0)
MCV: 86 fL (ref 79.5–101.0)
MONO#: 0.3 10*3/uL (ref 0.1–0.9)
MONO%: 8.6 % (ref 0.0–14.0)
NEUT%: 32.2 % — AB (ref 38.4–76.8)
NEUTROS ABS: 1.1 10*3/uL — AB (ref 1.5–6.5)
PLATELETS: 152 10*3/uL (ref 145–400)
RBC: 4 10*6/uL (ref 3.70–5.45)
RDW: 16 % — ABNORMAL HIGH (ref 11.2–14.5)
WBC: 3.3 10*3/uL — AB (ref 3.9–10.3)
lymph#: 1.9 10*3/uL (ref 0.9–3.3)
nRBC: 0 % (ref 0–0)

## 2016-12-16 NOTE — Progress Notes (Signed)
Hemoglobin noted at 11.7 today. No injection needed per protocol order. Pt given copy of current labs and schedule. Instructed to follow schedule and to call office should issues occur. Pt verbalized understanding of instructions

## 2016-12-26 DIAGNOSIS — S92354D Nondisplaced fracture of fifth metatarsal bone, right foot, subsequent encounter for fracture with routine healing: Secondary | ICD-10-CM | POA: Diagnosis not present

## 2017-01-06 ENCOUNTER — Ambulatory Visit (HOSPITAL_BASED_OUTPATIENT_CLINIC_OR_DEPARTMENT_OTHER): Payer: Medicare Other

## 2017-01-06 ENCOUNTER — Other Ambulatory Visit (HOSPITAL_BASED_OUTPATIENT_CLINIC_OR_DEPARTMENT_OTHER): Payer: Medicare Other

## 2017-01-06 VITALS — BP 141/73 | HR 58 | Temp 97.7°F | Resp 16

## 2017-01-06 DIAGNOSIS — N189 Chronic kidney disease, unspecified: Secondary | ICD-10-CM | POA: Diagnosis present

## 2017-01-06 DIAGNOSIS — N183 Chronic kidney disease, stage 3 unspecified: Secondary | ICD-10-CM

## 2017-01-06 DIAGNOSIS — D631 Anemia in chronic kidney disease: Secondary | ICD-10-CM

## 2017-01-06 LAB — CBC WITH DIFFERENTIAL/PLATELET
BASO%: 0.7 % (ref 0.0–2.0)
BASOS ABS: 0 10*3/uL (ref 0.0–0.1)
EOS ABS: 0.1 10*3/uL (ref 0.0–0.5)
EOS%: 2.1 % (ref 0.0–7.0)
HEMATOCRIT: 31.5 % — AB (ref 34.8–46.6)
HEMOGLOBIN: 10.7 g/dL — AB (ref 11.6–15.9)
LYMPH%: 47.2 % (ref 14.0–49.7)
MCH: 28.8 pg (ref 25.1–34.0)
MCHC: 34 g/dL (ref 31.5–36.0)
MCV: 84.8 fL (ref 79.5–101.0)
MONO#: 0.4 10*3/uL (ref 0.1–0.9)
MONO%: 9.2 % (ref 0.0–14.0)
NEUT%: 40.8 % (ref 38.4–76.8)
NEUTROS ABS: 1.8 10*3/uL (ref 1.5–6.5)
Platelets: 153 10*3/uL (ref 145–400)
RBC: 3.71 10*6/uL (ref 3.70–5.45)
RDW: 15.8 % — AB (ref 11.2–14.5)
WBC: 4.5 10*3/uL (ref 3.9–10.3)
lymph#: 2.1 10*3/uL (ref 0.9–3.3)

## 2017-01-06 MED ORDER — DARBEPOETIN ALFA 300 MCG/0.6ML IJ SOSY
300.0000 ug | PREFILLED_SYRINGE | INTRAMUSCULAR | Status: DC
  Administered 2017-01-06: 300 ug via SUBCUTANEOUS
  Filled 2017-01-06: qty 0.6

## 2017-01-16 DIAGNOSIS — S92354D Nondisplaced fracture of fifth metatarsal bone, right foot, subsequent encounter for fracture with routine healing: Secondary | ICD-10-CM | POA: Diagnosis not present

## 2017-01-17 ENCOUNTER — Other Ambulatory Visit: Payer: Self-pay | Admitting: Internal Medicine

## 2017-01-18 ENCOUNTER — Other Ambulatory Visit: Payer: Self-pay | Admitting: Internal Medicine

## 2017-01-18 DIAGNOSIS — I10 Essential (primary) hypertension: Secondary | ICD-10-CM

## 2017-01-27 ENCOUNTER — Encounter (HOSPITAL_BASED_OUTPATIENT_CLINIC_OR_DEPARTMENT_OTHER): Payer: Medicare Other

## 2017-01-27 ENCOUNTER — Other Ambulatory Visit (HOSPITAL_BASED_OUTPATIENT_CLINIC_OR_DEPARTMENT_OTHER): Payer: Medicare Other

## 2017-01-27 ENCOUNTER — Ambulatory Visit: Payer: Medicare Other

## 2017-01-27 DIAGNOSIS — D631 Anemia in chronic kidney disease: Secondary | ICD-10-CM | POA: Diagnosis not present

## 2017-01-27 DIAGNOSIS — D649 Anemia, unspecified: Secondary | ICD-10-CM

## 2017-01-27 DIAGNOSIS — N189 Chronic kidney disease, unspecified: Secondary | ICD-10-CM

## 2017-01-27 LAB — CBC WITH DIFFERENTIAL/PLATELET
BASO%: 0.3 % (ref 0.0–2.0)
BASOS ABS: 0 10*3/uL (ref 0.0–0.1)
EOS ABS: 0.1 10*3/uL (ref 0.0–0.5)
EOS%: 3.6 % (ref 0.0–7.0)
HCT: 31.9 % — ABNORMAL LOW (ref 34.8–46.6)
HGB: 10.8 g/dL — ABNORMAL LOW (ref 11.6–15.9)
LYMPH%: 44.6 % (ref 14.0–49.7)
MCH: 28.4 pg (ref 25.1–34.0)
MCHC: 33.9 g/dL (ref 31.5–36.0)
MCV: 83.9 fL (ref 79.5–101.0)
MONO#: 0.4 10*3/uL (ref 0.1–0.9)
MONO%: 9.2 % (ref 0.0–14.0)
NEUT#: 1.7 10*3/uL (ref 1.5–6.5)
NEUT%: 42.3 % (ref 38.4–76.8)
Platelets: 114 10*3/uL — ABNORMAL LOW (ref 145–400)
RBC: 3.8 10*6/uL (ref 3.70–5.45)
RDW: 16.3 % — ABNORMAL HIGH (ref 11.2–14.5)
WBC: 3.9 10*3/uL (ref 3.9–10.3)
lymph#: 1.8 10*3/uL (ref 0.9–3.3)

## 2017-01-27 MED ORDER — DARBEPOETIN ALFA 300 MCG/0.6ML IJ SOSY
300.0000 ug | PREFILLED_SYRINGE | INTRAMUSCULAR | Status: DC
  Administered 2017-01-27: 300 ug via SUBCUTANEOUS
  Filled 2017-01-27: qty 0.6

## 2017-01-27 NOTE — Patient Instructions (Signed)

## 2017-01-30 NOTE — Progress Notes (Signed)
MEDICARE ANNUAL WELLNESS VISIT AND FOLLOW UP  Assessment:   Essential hypertension - continue medications, DASH diet, exercise and monitor at home. Call if greater than 130/80.  -     CBC with Differential/Platelet -     BASIC METABOLIC PANEL WITH GFR -     Hepatic function panel  Hypothyroidism, unspecified type Hypothyroidism-check TSH level, continue medications the same, reminded to take on an empty stomach 30-32mins before food.  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  -     Lipid panel  Vitamin D deficiency Continue supplement  Prediabetes Discussed general issues about diabetes pathophysiology and management., Educational material distributed., Suggested low cholesterol diet., Encouraged aerobic exercise., Discussed foot care., Reminded to get yearly retinal exam.  Medication management -     Magnesium  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HCC) Continue follow up oncology -     CBC with Differential/Platelet  Gastroesophageal reflux disease, esophagitis presence not specified Diet discussed, follow up GI  Hemorrhoids, unspecified hemorrhoid type Diet discussed, follow up GI  Encounter for Medicare annual wellness exam Get MGM  BMI 30.0-30.9,adult  Over 30 minutes of exam, counseling, chart review, and critical decision making was performed  Plan:   During the course of the visit the patient was educated and counseled about appropriate screening and preventive services including:    Pneumococcal vaccine   Influenza vaccine  Td vaccine  Prevnar 13  Screening electrocardiogram  Screening mammography  Bone densitometry  screening  Colorectal cancer screening  Diabetes screening  Glaucoma screening  Nutrition counseling   Advanced directives: given info/requested copies    Subjective:   Susan [name] is a 76 y.o. AA female who presents for Medicare Annual Wellness Visit and 3 month follow up on hypertension, hyperlipidemia, MDS, and vitamin D def.   Her blood pressure has been controlled at home, DID NOT TAKE BP MEDS THIS AM, today their BP is BP: 140/86 She does workout. She denies chest pain, shortness of breath, dizziness.  She follows with Dr. Luisa Dago and gets periodic aranesp injection for anemia due to MDS and CKD from HTN.  Has been in a right boot, following with ortho, will be out of the boot on the 14th, seeing Dr. Doran Durand. .  She is not on cholesterol medication,  and denies myalgias. Her cholesterol is at goal. The cholesterol last visit was:   Lab Results  Component Value Date   CHOL 184 07/27/2016   HDL 61 07/27/2016   LDLCALC 98 07/27/2016   TRIG 126 07/27/2016   CHOLHDL 3.0 07/27/2016    Last A1C in the office was:  Lab Results  Component Value Date   HGBA1C 4.3 07/27/2016   Lab Results  Component Value Date   GFRAA 44 (L) 07/27/2016   Patient is on Vitamin D supplement. Lab Results  Component Value Date   VD25OH 48 04/11/2016     She is on thyroid medication. Her medication was not changed last visit.   Lab Results  Component Value Date   TSH 2.94 07/27/2016  .  BMI is Body mass index is 31.83 kg/m., she is working on diet and exercise.  Wt Readings from Last 3 Encounters:  01/31/17 174 lb (78.9 kg)  11/25/16 170 lb 12.8 oz (77.5 kg)  11/01/16 166 lb (75.3 kg)    Medication Review Current Outpatient Prescriptions on File Prior to Visit  Medication Sig Dispense Refill  . amLODipine (NORVASC) 10 MG tablet TAKE 1/2 TO 1 TABLET BY MOUTH EVERY DAY FOR BLOOD PRESSURE 90 tablet 0  . aspirin 81 MG chewable tablet Chew 81 mg by mouth every morning.     . Cetirizine  HCl 10 MG CAPS Take 1 capsule (10 mg total) by mouth at bedtime. 90 capsule 0  . Cholecalciferol (VITAMIN D-3) 5000 UNITS TABS Take 15,000 Units by mouth daily.     . Darbepoetin Alfa-Albumin (ARANESP IJ) Inject as directed every 21 ( twenty-one) days.     . diclofenac (VOLTAREN) 75 MG EC tablet TAKE 1 TABLET(75 MG) BY MOUTH TWICE DAILY 180 tablet 1  . diphenoxylate-atropine (LOMOTIL) 2.5-0.025 MG tablet Take 2 tablets by mouth 4 (four) times daily as needed for diarrhea or loose stools. 30 tablet 0  . ipratropium (ATROVENT) 0.03 % nasal spray Take 1 to 2 sprays into each nostril up to 2 or 3 x day as needed 30 mL 99  . labetalol (NORMODYNE) 300 MG tablet TAKE 1 TABLET BY MOUTH TWICE DAILY 180 tablet 0  . levothyroxine (SYNTHROID, LEVOTHROID) 100 MCG tablet TAKE 1 TABLET BY MOUTH DAILY ON AN EMPTY STOMACH FOR 30 MINUTES 90 tablet 3  . losartan-hydrochlorothiazide (HYZAAR) 100-25 MG per tablet Take 1 tablet daily for BP (Patient taking differently: Take 0.5 tablets by mouth daily. ) 90 tablet 1  . pantoprazole (PROTONIX) 40 MG tablet TAKE 1 TABLET BY MOUTH EVERY DAY FOR ACID REFLUX 90 tablet 0  . azelastine (ASTELIN) 0.1 % nasal spray Place 2 sprays into both nostrils 2 (two) times daily. Use in each nostril as directed 30 mL 2   No current facility-administered medications on file prior to visit.     Current Problems (verified) Patient Active Problem List   Diagnosis Date Noted  . 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 BMI 30.0-30.9,adult 09/02/2015  . Encounter for Medicare annual wellness exam 06/02/2015  . 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[place]) 11/03/2014  . CKD, 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) 11/02/2014  . Prediabetes 01/27/2014  . Medication management 01/27/2014  . Hyperlipidemia 08/27/2013  . Essential hypertension   . Hypothyroidism   . GERD (gastroesophageal reflux disease)   . Vitamin D deficiency   . Hemorrhoids 07/15/2013    Screening Tests Immunization History  Administered  Date(s) Administered  . Td 10/03/2005    Preventative care: Last colonoscopy: 2008, will not get another, has appointment with [facility] Last mammogram: March 2015 [facility] Last pap smear/pelvic exam: remote  DEXA: 2013 per patient CXR 2016 CT head 03/2016  Prior vaccinations: TD or Tdap: 2007, declines today  Influenza: declines Pneumococcal: declines Prevnar13: declines Shingles/Zostavax: declines  Names of Other Physician/Practitioners you currently use: 1. [facility]- here for primary care 2. Dr. Herbert Deaner, eye doctor, last visit Sept 2016, wears glasses 3. None, dentist, last visit 2017, has appt in June Patient Care Team: Unk Pinto, MD as PCP - General (Internal Medicine) Wyatt Portela, MD (Hematology and Oncology) Inda Castle, MD as Consulting Physician (Gastroenterology) Malena Catholic, MD as Referring Physician Monna Fam, MD as Consulting Physician (Ophthalmology)  Allergies Allergies  Allergen Reactions  . Codeine Anaphylaxis  . Penicillins Anaphylaxis      . Bactrim  Itching and Swelling  . Clarithromycin Other (See Comments)    "Caused skin to peel off my hand."  . Flagyl [Metronidazole Hcl] Itching and Swelling  . Sulfa Antibiotics Itching and Swelling    SURGICAL HISTORY She  has a past surgical history that includes Umbilical hernia repair; Rotator cuff repair (Left); Inguinal hernia repair; Carpal tunnel release (Right); and Dilation and curettage of uterus. FAMILY HISTORY Her family history includes Bladder Cancer in her sister; Cancer in her mother; Coronary artery disease in her brother; Hypertension in her father and sister; Stroke in her father and sister. SOCIAL HISTORY She  reports that she quit smoking about 20 years ago. Her smoking use included Cigarettes. She has never used smokeless tobacco. She reports that she drinks alcohol. She reports that she does not use drugs.  MEDICARE WELLNESS  OBJECTIVES: Physical activity: Current Exercise Habits: The patient does not participate in regular exercise at present Cardiac risk factors: Cardiac Risk Factors include: advanced age (>71men, >7 women);family history of premature cardiovascular disease;hypertension;dyslipidemia;obesity (BMI >30kg/m2);sedentary lifestyle Depression/mood screen:   Depression screen [facility] 2/9 01/31/2017  Decreased Interest 0  Down, Depressed, Hopeless 0  PHQ - 2 Score 0  Some recent data might be hidden    ADLs:  In your present state of health, do you have any difficulty performing the following activities: 01/31/2017 04/11/2016  Hearing? N N  Vision? N N  Difficulty concentrating or making decisions? N N  Walking or climbing stairs? N N  Dressing or bathing? N N  Doing errands, shopping? N N  Some recent data might be hidden     Cognitive Testing  Alert? Yes  Normal Appearance?Yes  Oriented to person? Yes  Place? Yes   Time? Yes  Recall of three objects?  Yes  Can perform simple calculations? Yes  Displays appropriate judgment?Yes  Can read the correct time from a watch face?Yes  EOL planning: Does Patient Have a Medical Advance Directive?: No Would patient like information on creating a medical advance directive?: Yes (ED - Information included in AVS)   Objective:   Today's Vitals   01/31/17 1023  BP: 140/86  Pulse: 72  Resp: 14  Temp: 97.9 F (36.6 C)  SpO2: 98%  Weight: 174 lb (78.9 kg)  Height: 5\' 2"  (1.575 m)  PainSc: 10-Worst pain ever  PainLoc: Wrist   Body mass index is 31.83 kg/m.  General appearance: alert, no distress, WD/WN,  female HEENT: normocephalic, sclerae anicteric, TMs pearly, nares patent, no discharge or erythema, pharynx normal Oral cavity: MMM, no lesions Neck: supple, no lymphadenopathy, no thyromegaly, no masses Heart: RRR, normal S1, S2, no murmurs Lungs: CTA bilaterally, no wheezes, rhonchi, or rales Abdomen: +bs, soft, non tender, non distended, no  masses, no hepatomegaly, no splenomegaly Musculoskeletal: nontender, no swelling, no obvious deformity Extremities: no edema, no cyanosis, no clubbing Pulses: 2+ symmetric, upper and lower extremities, normal cap refill Neurological: alert, oriented x 3, CN2-12 intact, strength normal upper extremities and lower extremities, sensation normal throughout, DTRs 2+ throughout, no cerebellar signs, gait normal Psychiatric: normal affect, behavior normal, pleasant  Breast: defer Gyn: defer Rectal: defer   Medicare Attestation I have personally reviewed: The patient's medical and social history Their use of alcohol, tobacco or illicit drugs Their current medications and supplements The patient's functional ability including ADLs,fall risks, home safety risks, cognitive, and hearing and visual impairment Diet and physical activities Evidence for depression or mood disorders  The patient's weight, height, BMI, and visual acuity have  been recorded in the chart.  I have made referrals, counseling, and provided education to the patient based on review of the above and I have provided the patient with a written personalized care plan for preventive services.     Vicie Mutters, PA-C   01/31/2017

## 2017-01-31 ENCOUNTER — Ambulatory Visit (INDEPENDENT_AMBULATORY_CARE_PROVIDER_SITE_OTHER): Payer: Medicare Other | Admitting: Physician Assistant

## 2017-01-31 ENCOUNTER — Encounter: Payer: Self-pay | Admitting: Physician Assistant

## 2017-01-31 VITALS — BP 140/86 | HR 72 | Temp 97.9°F | Resp 14 | Ht 62.0 in | Wt 174.0 lb

## 2017-01-31 DIAGNOSIS — I10 Essential (primary) hypertension: Secondary | ICD-10-CM | POA: Diagnosis not present

## 2017-01-31 DIAGNOSIS — E039 Hypothyroidism, unspecified: Secondary | ICD-10-CM

## 2017-01-31 DIAGNOSIS — R7303 Prediabetes: Secondary | ICD-10-CM | POA: Diagnosis not present

## 2017-01-31 DIAGNOSIS — N183 Chronic kidney disease, stage 3 unspecified: Secondary | ICD-10-CM

## 2017-01-31 DIAGNOSIS — Z0001 Encounter for general adult medical examination with abnormal findings: Secondary | ICD-10-CM | POA: Diagnosis not present

## 2017-01-31 DIAGNOSIS — K219 Gastro-esophageal reflux disease without esophagitis: Secondary | ICD-10-CM

## 2017-01-31 DIAGNOSIS — Z79899 Other long term (current) drug therapy: Secondary | ICD-10-CM | POA: Diagnosis not present

## 2017-01-31 DIAGNOSIS — E559 Vitamin D deficiency, unspecified: Secondary | ICD-10-CM | POA: Diagnosis not present

## 2017-01-31 DIAGNOSIS — D469 Myelodysplastic syndrome, unspecified: Secondary | ICD-10-CM

## 2017-01-31 DIAGNOSIS — Z0000 Encounter for general adult medical examination without abnormal findings: Secondary | ICD-10-CM

## 2017-01-31 DIAGNOSIS — N189 Chronic kidney disease, unspecified: Secondary | ICD-10-CM

## 2017-01-31 DIAGNOSIS — Z6830 Body mass index (BMI) 30.0-30.9, adult: Secondary | ICD-10-CM | POA: Diagnosis not present

## 2017-01-31 DIAGNOSIS — R6889 Other general symptoms and signs: Secondary | ICD-10-CM

## 2017-01-31 DIAGNOSIS — K649 Unspecified hemorrhoids: Secondary | ICD-10-CM

## 2017-01-31 DIAGNOSIS — E782 Mixed hyperlipidemia: Secondary | ICD-10-CM

## 2017-01-31 DIAGNOSIS — D631 Anemia in chronic kidney disease: Secondary | ICD-10-CM | POA: Diagnosis not present

## 2017-01-31 NOTE — Patient Instructions (Addendum)
[facility]  7 a.m.-6:30 p.m., Monday 7 a.m.-5 p.m., Tuesday-Friday Schedule an appointment by calling [phone].  [facility] Schedule an appointment by calling [phone].  Encourage you to get the 3D Mammogram  The 3D Mammogram is much more specific and sensitive to pick up breast cancer. For women with fibrocystic breast or lumpy breast it can be hard to determine if it is cancer or not but the 3D mammogram is able to tell this difference which cuts back on unneeded additional tests or scary call backs.   - over 40% increase in detection of breast cancer - over 40% reduction in false positives.  - fewer call backs - reduced anxiety - improved outcomes - [facility]  Wear the carpal tunnel brace at night in her left hand, follow up with ortho Get injections  You can take tylenol (500mg ) or tylenol arthritis (650mg ) with the meloxicam/antiinflammatories. The max you can take of tylenol a day is 3000mg  daily, this is a max of 6 pills a day of the regular tyelnol (500mg ) or a max of 4 a day of the tylenol arthritis (650mg ) as long as no other medications you are taking contain tylenol.

## 2017-02-01 LAB — CBC WITH DIFFERENTIAL/PLATELET
BASOS ABS: 0 {cells}/uL (ref 0–200)
Basophils Relative: 0 %
EOS ABS: 74 {cells}/uL (ref 15–500)
Eosinophils Relative: 2 %
HCT: 34.1 % — ABNORMAL LOW (ref 35.0–45.0)
HEMOGLOBIN: 11 g/dL — AB (ref 11.7–15.5)
LYMPHS ABS: 1776 {cells}/uL (ref 850–3900)
Lymphocytes Relative: 48 %
MCH: 27.9 pg (ref 27.0–33.0)
MCHC: 32.3 g/dL (ref 32.0–36.0)
MCV: 86.5 fL (ref 80.0–100.0)
Monocytes Absolute: 296 cells/uL (ref 200–950)
Monocytes Relative: 8 %
NEUTROS ABS: 1554 {cells}/uL (ref 1500–7800)
NEUTROS PCT: 42 %
Platelets: 137 10*3/uL — ABNORMAL LOW (ref 140–400)
RBC: 3.94 MIL/uL (ref 3.80–5.10)
RDW: 16.4 % — ABNORMAL HIGH (ref 11.0–15.0)
WBC: 3.7 10*3/uL — ABNORMAL LOW (ref 3.8–10.8)

## 2017-02-01 LAB — HEPATIC FUNCTION PANEL
ALBUMIN: 4.3 g/dL (ref 3.6–5.1)
ALT: 8 U/L (ref 6–29)
AST: 17 U/L (ref 10–35)
Alkaline Phosphatase: 87 U/L (ref 33–130)
Bilirubin, Direct: 0.1 mg/dL (ref <=0.2)
Indirect Bilirubin: 0.6 mg/dL (ref 0.2–1.2)
Total Bilirubin: 0.7 mg/dL (ref 0.2–1.2)
Total Protein: 7.2 g/dL (ref 6.1–8.1)

## 2017-02-01 LAB — BASIC METABOLIC PANEL WITH GFR
BUN: 32 mg/dL — AB (ref 7–25)
CALCIUM: 9.4 mg/dL (ref 8.6–10.4)
CHLORIDE: 107 mmol/L (ref 98–110)
CO2: 22 mmol/L (ref 20–31)
CREATININE: 1.57 mg/dL — AB (ref 0.60–0.93)
GFR, Est African American: 36 mL/min — ABNORMAL LOW (ref >=60)
GFR, Est Non African American: 32 mL/min — ABNORMAL LOW (ref >=60)
GLUCOSE: 89 mg/dL (ref 65–99)
Potassium: 3.6 mmol/L (ref 3.5–5.3)
Sodium: 143 mmol/L (ref 135–146)

## 2017-02-01 LAB — LIPID PANEL
Cholesterol: 199 mg/dL (ref <200)
HDL: 67 mg/dL (ref >50)
LDL CALC: 117 mg/dL — AB (ref <100)
TRIGLYCERIDES: 75 mg/dL (ref <150)
Total CHOL/HDL Ratio: 3 Ratio (ref <5.0)
VLDL: 15 mg/dL (ref <30)

## 2017-02-01 LAB — MAGNESIUM: Magnesium: 1.4 mg/dL — ABNORMAL LOW (ref 1.5–2.5)

## 2017-02-01 LAB — TSH: TSH: 2.2 mIU/L

## 2017-02-02 NOTE — Progress Notes (Signed)
Pt aware of lab results & voiced understanding of those results.

## 2017-02-07 ENCOUNTER — Encounter: Payer: Self-pay | Admitting: Gastroenterology

## 2017-02-07 ENCOUNTER — Ambulatory Visit (INDEPENDENT_AMBULATORY_CARE_PROVIDER_SITE_OTHER): Payer: Medicare Other | Admitting: Gastroenterology

## 2017-02-07 VITALS — BP 134/68 | HR 74 | Ht 62.0 in | Wt 170.4 lb

## 2017-02-07 DIAGNOSIS — K625 Hemorrhage of anus and rectum: Secondary | ICD-10-CM | POA: Diagnosis not present

## 2017-02-07 DIAGNOSIS — R159 Full incontinence of feces: Secondary | ICD-10-CM | POA: Diagnosis not present

## 2017-02-07 DIAGNOSIS — D649 Anemia, unspecified: Secondary | ICD-10-CM

## 2017-02-07 DIAGNOSIS — N184 Chronic kidney disease, stage 4 (severe): Secondary | ICD-10-CM | POA: Diagnosis not present

## 2017-02-07 MED ORDER — NA SULFATE-K SULFATE-MG SULF 17.5-3.13-1.6 GM/177ML PO SOLN: ORAL | 0 refills | Status: DC

## 2017-02-07 NOTE — Progress Notes (Signed)
Susan [name]    [id number]    1941-06-21  Primary Care Physician:[name], [name], MD  Referring Physician: Unk Pinto, MD [address] [place],  [address]  Chief complaint:  Rectal bleeding, hemorrhoids, fecal incontinence  HPI: 76 year old female previously followed by Dr. Deatra [name] , last seen in office 07/15/2013 is here for evaluation with complaints of small-volume bright red blood per rectum after a bowel movement for past 6 weeks. She was constipated and was straining excessively before she started having episodes of bright red blood per rectum. Constipation has improved since she started taking MiraLAX one capful every other day, but continues to have episodes of fecal incontinence. She is afraid to walk as it triggers her to have a bowel movement and has severe fecal urgency and also episodes of incontinence. She doesn't feel like she has completely evacuated after a bowel movement.  Denies any abdominal pain, nausea, vomiting, dysphagia or melena.  Per Dr. Kelby Fam note and patient, she had colonoscopy at outside facility in 2008 that was normal, report is not available to review.  Other significant medical history include chronic kidney disease, myelodysplastic syndrome on darbepoetin injections every 21 days   Outpatient Encounter Prescriptions as of 02/07/2017  Medication Sig  . amLODipine (NORVASC) 10 MG tablet TAKE 1/2 TO 1 TABLET BY MOUTH EVERY DAY FOR BLOOD PRESSURE  . aspirin 81 MG chewable tablet Chew 81 mg by mouth every morning.   . Cholecalciferol (VITAMIN D-3) 5000 UNITS TABS Take 15,000 Units by mouth daily.   . Darbepoetin Alfa-Albumin (ARANESP IJ) Inject as directed every 21 ( twenty-one) days.   . diclofenac (VOLTAREN) 75 MG EC tablet TAKE 1 TABLET(75 MG) BY MOUTH TWICE DAILY  . diphenoxylate-atropine (LOMOTIL) 2.5-0.025 MG tablet Take 2 tablets by mouth 4 (four) times daily as needed for diarrhea or loose stools.  [name]  ipratropium (ATROVENT) 0.03 % nasal spray Take 1 to 2 sprays into each nostril up to 2 or 3 x day as needed  . labetalol (NORMODYNE) 300 MG tablet TAKE 1 TABLET BY MOUTH TWICE DAILY  . levothyroxine (SYNTHROID, LEVOTHROID) 100 MCG tablet TAKE 1 TABLET BY MOUTH DAILY ON AN EMPTY STOMACH FOR 30 MINUTES  . losartan-hydrochlorothiazide (HYZAAR) 100-25 MG per tablet Take 1 tablet daily for BP (Patient taking differently: Take 0.5 tablets by mouth daily. )  . pantoprazole (PROTONIX) 40 MG tablet TAKE 1 TABLET BY MOUTH EVERY DAY FOR ACID REFLUX  . azelastine (ASTELIN) 0.1 % nasal spray Place 2 sprays into both nostrils 2 (two) times daily. Use in each nostril as directed  . [DISCONTINUED] Cetirizine HCl 10 MG CAPS Take 1 capsule (10 mg total) by mouth at bedtime.   No facility-administered encounter medications on file as of 02/07/2017.     Allergies as of 02/07/2017 - Review Complete 02/07/2017  Allergen Reaction Noted  . Codeine Anaphylaxis 12/28/2011  . Penicillins Anaphylaxis 12/28/2011  . Bactrim Itching and Swelling 12/28/2011  . Clarithromycin Other (See Comments) 12/28/2011  . Flagyl [metronidazole hcl] Itching and Swelling 12/28/2011  . Sulfa antibiotics Itching and Swelling 12/28/2011    Past Medical History:  Diagnosis Date  . Anemia   . Arthritis   . GERD (gastroesophageal reflux disease)   . HLD (hyperlipidemia)   . Hypertension   . Hypothyroidism   . Myelodysplasia   . Vitamin D deficiency     Past Surgical History:  Procedure Laterality Date  . CARPAL TUNNEL RELEASE Right   .  DILATION AND CURETTAGE OF UTERUS    . INGUINAL HERNIA REPAIR    . ROTATOR CUFF REPAIR Left   . UMBILICAL HERNIA REPAIR      Family History  Problem Relation Age of Onset  . Stroke Father   . Hypertension Father   . Cancer Mother   . Stroke Sister   . Coronary artery disease Brother   . Hypertension Sister   . Bladder Cancer Sister     Social History   Social History  . Marital  status: Single    Spouse name: N/A  . Number of children: 1  . Years of education: N/A   Occupational History  . retired Retired   Social History Main Topics  . Smoking status: Former Smoker    Types: Cigarettes    Quit date: 10/02/1996  . Smokeless tobacco: Never Used  . Alcohol use 0.0 oz/week     Comment: glass of wine at least once a month.  . Drug use: No  . Sexual activity: Not on file   Other Topics Concern  . Not on file   Social History Narrative  . No narrative on file      Review of systems: Review of Systems  Constitutional: Negative for fever and chills.  HENT: Negative.   Eyes: Negative for blurred vision.  Respiratory: Negative for cough, shortness of breath and wheezing.   Cardiovascular: Negative for chest pain and palpitations.  Gastrointestinal: as per HPI Genitourinary: Negative for dysuria, urgency, frequency and hematuria.  Musculoskeletal: Negative for myalgias, back pain and joint pain.  Skin: Negative for itching and rash.  Neurological: Negative for dizziness, tremors, focal weakness, seizures and loss of consciousness.  Endo/Heme/Allergies: Positive for seasonal allergies.  Psychiatric/Behavioral: Negative for depression, suicidal ideas and hallucinations.  All other systems reviewed and are negative.   Physical Exam: Vitals:   02/07/17 0812  BP: 134/68  Pulse: 74   Body mass index is 31.17 kg/m. Gen:      No acute distress HEENT:  EOMI, sclera anicteric Neck:     No masses; no thyromegaly Lungs:    Clear to auscultation bilaterally; normal respiratory effort CV:         Regular rate and rhythm; no murmurs Abd:      + bowel sounds; soft, non-tender; no palpable masses, no distension Ext:    No edema; adequate peripheral perfusion Skin:      Warm and dry; no rash Neuro: alert and oriented x 3 Psych: normal mood and affect  Data Reviewed:  Reviewed labs, radiology imaging, old records and pertinent past GI work  up   Assessment and Plan/Recommendations: 76 year old female With chronic kidney disease, myelodysplastic syndrome, chronic anemia here with complaints of intermittent bright red blood per rectum, symptomatic hemorrhoids and fecal incontinence  Small-volume bright red blood per rectum likely secondary to bleeding internal hemorrhoids but cannot exclude colorectal cancer, bleeding polyp, proctitis or rectal ulcer Patient is due for screening colonoscopy We will schedule colonoscopy for evaluation The risks and benefits as well as alternatives of endoscopic procedure(s) have been discussed and reviewed. All questions answered. The patient agrees to proceed.  If no other etiology other than internal hemorrhoids to explain bright red blood per rectum will consider hemorrhoidal band ligation  Fecal incontinence: Continue MiraLAX 1 capful titrate dose to have daily bowel movements Benefiber 1 teaspoon 3 times a day with meals If continues to have persistent symptoms we'll consider anorectal manometry for further evaluation and referral to PT  Damaris Hippo , MD [phone] Mon-Fri 8a-5p [phone] after 5p, weekends, holidays  CC: Unk Pinto, MD

## 2017-02-07 NOTE — Patient Instructions (Addendum)
You have been scheduled for a colonoscopy. Please follow written instructions given to you at your visit today.  Please pick up your prep supplies at the pharmacy within the next 1-3 days. If you use inhalers (even only as needed), please bring them with you on the day of your procedure. Your physician has requested that you go to www.startemmi.com and enter the access code given to you at your visit today. This web site gives a general overview about your procedure. However, you should still follow specific instructions given to you by our office regarding your preparation for the procedure.   We have scheduled you for a hemorrhoidal banding on  02/15/2017 at 2:15pm

## 2017-02-08 ENCOUNTER — Encounter: Payer: Self-pay | Admitting: Physician Assistant

## 2017-02-10 ENCOUNTER — Ambulatory Visit (AMBULATORY_SURGERY_CENTER): Payer: Medicare Other | Admitting: Gastroenterology

## 2017-02-10 ENCOUNTER — Encounter: Payer: Self-pay | Admitting: Gastroenterology

## 2017-02-10 VITALS — BP 116/61 | HR 66 | Temp 97.7°F | Resp 14 | Ht 62.0 in | Wt 170.0 lb

## 2017-02-10 DIAGNOSIS — I10 Essential (primary) hypertension: Secondary | ICD-10-CM | POA: Diagnosis not present

## 2017-02-10 DIAGNOSIS — K625 Hemorrhage of anus and rectum: Secondary | ICD-10-CM | POA: Diagnosis not present

## 2017-02-10 LAB — HM COLONOSCOPY

## 2017-02-10 MED ORDER — SODIUM CHLORIDE 0.9 % IV SOLN: 500.0000 mL | INTRAVENOUS | Status: DC

## 2017-02-10 NOTE — Patient Instructions (Signed)
YOU HAD AN ENDOSCOPIC PROCEDURE TODAY AT [facility]:   Refer to the procedure report that was given to you for any specific questions about what was found during the examination.  If the procedure report does not answer your questions, please call your gastroenterologist to clarify.  If you requested that your care partner not be given the details of your procedure findings, then the procedure report has been included in a sealed envelope for you to review at your convenience later.  YOU SHOULD EXPECT: Some feelings of bloating in the abdomen. Passage of more gas than usual.  Walking can help get rid of the air that was put into your GI tract during the procedure and reduce the bloating. If you had a lower endoscopy (such as a colonoscopy or flexible sigmoidoscopy) you may notice spotting of blood in your stool or on the toilet paper. If you underwent a bowel prep for your procedure, you may not have a normal bowel movement for a few days.  Please Note:  You might notice some irritation and congestion in your nose or some drainage.  This is from the oxygen used during your procedure.  There is no need for concern and it should clear up in a day or so.  SYMPTOMS TO REPORT IMMEDIATELY:   Following lower endoscopy (colonoscopy or flexible sigmoidoscopy):  Excessive amounts of blood in the stool  Significant tenderness or worsening of abdominal pains  Swelling of the abdomen that is new, acute  Fever of 100F or higher   For urgent or emergent issues, a gastroenterologist can be reached at any hour by calling [phone].   DIET:  We do recommend a small meal at first, but then you may proceed to your regular diet.  Drink plenty of fluids but you should avoid alcoholic beverages for 24 hours.  ACTIVITY:  You should plan to take it easy for the rest of today and you should NOT DRIVE or use heavy machinery until tomorrow (because of the sedation medicines used during the test).     FOLLOW UP: Our staff will call the number listed on your records the next business day following your procedure to check on you and address any questions or concerns that you may have regarding the information given to you following your procedure. If we do not reach you, we will leave a message.  However, if you are feeling well and you are not experiencing any problems, there is no need to return our call.  We will assume that you have returned to your regular daily activities without incident.  If any biopsies were taken you will be contacted by phone or by letter within the next 1-3 weeks.  Please call us at [phone] if you have not heard about the biopsies in 3 weeks.    SIGNATURES/CONFIDENTIALITY: You and/or your care partner have signed paperwork which will be entered into your electronic medical record.  These signatures attest to the fact that that the information above on your After Visit Summary has been reviewed and is understood.  Full responsibility of the confidentiality of this discharge information lies with you and/or your care-partner.   Resume medications. Information given on hemorrhoids and diverticulosis.

## 2017-02-10 NOTE — Op Note (Signed)
[facility] Patient Name: Susan [name] Procedure Date: 02/10/2017 10:28 AM MRN: [id number] Endoscopist: Mauri Pole , MD Age: 76 Referring MD:  Date of Birth: Feb 04, 1941 Gender: Female Account #: [id number] Procedure:                Colonoscopy Indications:              Evaluation of unexplained GI bleeding, Last                            colonoscopy: 2008 Medicines:                Monitored Anesthesia Care Procedure:                Pre-Anesthesia Assessment:                           - Prior to the procedure, a History and Physical                            was performed, and patient medications and                            allergies were reviewed. The patient's tolerance of                            previous anesthesia was also reviewed. The risks                            and benefits of the procedure and the sedation                            options and risks were discussed with the patient.                            All questions were answered, and informed consent                            was obtained. Prior Anticoagulants: The patient has                            taken no previous anticoagulant or antiplatelet                            agents. ASA Grade Assessment: II - A patient with                            mild systemic disease. After reviewing the risks                            and benefits, the patient was deemed in                            satisfactory condition to undergo the procedure.  After obtaining informed consent, the colonoscope                            was passed under direct vision. Throughout the                            procedure, the patient's blood pressure, pulse, and                            oxygen saturations were monitored continuously. The                            Model PCF-H190DL [id number]) scope was introduced                            through the anus and advanced to the the  cecum,                            identified by appendiceal orifice and ileocecal                            valve. The colonoscopy was performed without                            difficulty. The patient tolerated the procedure                            well. The quality of the bowel preparation was                            good. The ileocecal valve, appendiceal orifice, and                            rectum were photographed. Scope In: 10:30:20 AM Scope Out: 10:44:55 AM Scope Withdrawal Time: 0 hours 9 minutes 15 seconds  Total Procedure Duration: 0 hours 14 minutes 35 seconds  Findings:                 The perianal and digital rectal examinations were                            normal.                           Multiple small and large-mouthed diverticula were                            found in the sigmoid colon, descending colon,                            transverse colon and ascending colon. There was                            narrowing of the colon in association with the  diverticular opening. There was evidence of                            diverticular spasm. Peri-diverticular erythema was                            seen. There was evidence of an impacted                            diverticulum. Petechia were visualized in                            association with the diverticular opening.                           Non-bleeding internal hemorrhoids were found during                            retroflexion. The hemorrhoids were large.                           The exam was otherwise without abnormality. Complications:            No immediate complications. Estimated Blood Loss:     Estimated blood loss: none. Impression:               - Severe diverticulosis in the sigmoid colon, in                            the descending colon, in the transverse colon and                            in the ascending colon. There was narrowing of the                             colon in association with the diverticular opening.                            There was evidence of diverticular spasm.                            Peri-diverticular erythema was seen. There was                            evidence of an impacted diverticulum. Petechia were                            visualized in association with the diverticular                            opening.                           - Non-bleeding internal hemorrhoids.                           -  The examination was otherwise normal.                           - No specimens collected. Recommendation:           - Patient has a contact number available for                            emergencies. The signs and symptoms of potential                            delayed complications were discussed with the                            patient. Return to normal activities tomorrow.                            Written discharge instructions were provided to the                            patient.                           - Resume previous diet.                           - Continue present medications.                           - Repeat colonoscopy in 10 years for screening                            purposes.                           - Return to GI clinic. Mauri Pole, MD 02/10/2017 10:50:20 AM This report has been signed electronically.

## 2017-02-10 NOTE — Progress Notes (Signed)
Patient awakening,vss,report to rn 

## 2017-02-13 ENCOUNTER — Telehealth: Payer: Self-pay

## 2017-02-13 NOTE — Telephone Encounter (Signed)
  Follow up Call-  Call back number 02/10/2017  Post procedure Call Back phone  # [phone]  Permission to leave phone message Yes  Some recent data might be hidden     Patient questions:  Do you have a fever, pain , or abdominal swelling? No. Pain Score  0 *  Have you tolerated food without any problems? Yes.    Have you been able to return to your normal activities? Yes.    Do you have any questions about your discharge instructions: Diet   No. Medications  No. Follow up visit  No.  Do you have questions or concerns about your Care? No.  Actions: * If pain score is 4 or above: No action needed, pain <4.

## 2017-02-13 NOTE — Telephone Encounter (Signed)
Attempted to reach pt. With follow up call following endoscopic procedure.  Attempted twice this morning, number busy.   Will try to reach pt. Later today.

## 2017-02-15 ENCOUNTER — Other Ambulatory Visit: Payer: Self-pay | Admitting: Internal Medicine

## 2017-02-15 ENCOUNTER — Encounter: Payer: Self-pay | Admitting: Gastroenterology

## 2017-02-15 ENCOUNTER — Ambulatory Visit (INDEPENDENT_AMBULATORY_CARE_PROVIDER_SITE_OTHER): Payer: Medicare Other | Admitting: Gastroenterology

## 2017-02-15 VITALS — BP 123/80 | HR 105 | Ht 62.0 in | Wt 168.0 lb

## 2017-02-15 DIAGNOSIS — K625 Hemorrhage of anus and rectum: Secondary | ICD-10-CM

## 2017-02-15 DIAGNOSIS — K623 Rectal prolapse: Secondary | ICD-10-CM

## 2017-02-15 NOTE — Progress Notes (Signed)
Susan [name]    [id number]    [birth year]  Primary Care Physician:[name], [name], MD  Referring Physician: Unk Pinto, MD [address] [place],  [address]  Chief complaint: BRBPR, mucus discharge  HPI:  76 year old female here with complaints of symptomatic hemorrhoids requesting hemorrhoidal band ligation. She continues to have intermittent bright red blood per rectum and she also notices white discharge and see pinch on her underwear. Patient said she has protruded hemorrhoidal tissue, unable to push it back for past week. Denies any change in bowel habits, nausea, vomiting, dysphagia, melena or abdominal pain. Colonoscopy 02/10/2017 showed pancolonic diverticulosis and internal hemorrhoids.  Outpatient Encounter Prescriptions as of 02/15/2017  Medication Sig  . amLODipine (NORVASC) 10 MG tablet TAKE 1/2 TO 1 TABLET BY MOUTH EVERY DAY FOR BLOOD PRESSURE  . aspirin 81 MG chewable tablet Chew 81 mg by mouth every morning.   . Cholecalciferol (VITAMIN D-3) 5000 UNITS TABS Take 15,000 Units by mouth daily.   . Darbepoetin Alfa-Albumin (ARANESP IJ) Inject as directed every 21 ( twenty-one) days.   . diclofenac (VOLTAREN) 75 MG EC tablet TAKE 1 TABLET(75 MG) BY MOUTH TWICE DAILY  . diphenoxylate-atropine (LOMOTIL) 2.5-0.025 MG tablet Take 2 tablets by mouth 4 (four) times daily as needed for diarrhea or loose stools.  [name] ipratropium (ATROVENT) 0.03 % nasal spray Take 1 to 2 sprays into each nostril up to 2 or 3 x day as needed  . labetalol (NORMODYNE) 300 MG tablet TAKE 1 TABLET BY MOUTH TWICE DAILY  . levothyroxine (SYNTHROID, LEVOTHROID) 100 MCG tablet TAKE 1 TABLET BY MOUTH DAILY ON AN EMPTY STOMACH FOR 30 MINUTES  . losartan-hydrochlorothiazide (HYZAAR) 100-25 MG tablet TAKE 1 TABLET BY MOUTH DAILY  . pantoprazole (PROTONIX) 40 MG tablet TAKE 1 TABLET BY MOUTH EVERY DAY FOR ACID REFLUX  . azelastine (ASTELIN) 0.1 % nasal spray Place 2 sprays  into both nostrils 2 (two) times daily. Use in each nostril as directed  . losartan-hydrochlorothiazide (HYZAAR) 100-25 MG per tablet Take 1 tablet daily for BP (Patient taking differently: Take 0.5 tablets by mouth daily. )   Facility-Administered Encounter Medications as of 02/15/2017  Medication  . 0.9 %  sodium chloride infusion    Allergies as of 02/15/2017 - Review Complete 02/15/2017  Allergen Reaction Noted  . Codeine Anaphylaxis 12/28/2011  . Penicillins Anaphylaxis 12/28/2011  . Bactrim Itching and Swelling 12/28/2011  . Clarithromycin Other (See Comments) 12/28/2011  . Flagyl [metronidazole hcl] Itching and Swelling 12/28/2011  . Sulfa antibiotics Itching and Swelling 12/28/2011    Past Medical History:  Diagnosis Date  . Anemia   . Arthritis   . Blood transfusion without reported diagnosis   . GERD (gastroesophageal reflux disease)   . HLD (hyperlipidemia)   . Hypertension   . Hypothyroidism   . Myelodysplasia   . Vitamin D deficiency     Past Surgical History:  Procedure Laterality Date  . CARPAL TUNNEL RELEASE Right   . DILATION AND CURETTAGE OF UTERUS    . INGUINAL HERNIA REPAIR    . ROTATOR CUFF REPAIR Left   . UMBILICAL HERNIA REPAIR      Family History  Problem Relation Age of Onset  . Stroke Father   . Hypertension Father   . Other Mother        died at early age  . Stroke Sister   . Coronary artery disease Brother   . Hypertension Sister   .  Bladder Cancer Sister   . Colon cancer Neg Hx   . Stomach cancer Neg Hx     Social History   Social History  . Marital status: Single    Spouse name: N/A  . Number of children: 1  . Years of education: N/A   Occupational History  . retired Retired   Social History Main Topics  . Smoking status: Former Smoker    Types: Cigarettes    Quit date: 10/02/1996  . Smokeless tobacco: Never Used  . Alcohol use 0.0 oz/week     Comment: glass of wine at least once a month.  . Drug use: No  . Sexual  activity: Not Currently    Partners: Male   Other Topics Concern  . Not on file   Social History Narrative  . No narrative on file      Review of systems: Review of Systems  Constitutional: Negative for fever and chills.  HENT: Negative.   Eyes: Negative for blurred vision.  Respiratory: Negative for cough, shortness of breath and wheezing.   Cardiovascular: Negative for chest pain and palpitations.  Gastrointestinal: as per HPI Genitourinary: Negative for dysuria, urgency, frequency and hematuria.  Musculoskeletal: Negative for myalgias, back pain and joint pain.  Skin: Negative for itching and rash.  Neurological: Negative for dizziness, tremors, focal weakness, seizures and loss of consciousness.  Endo/Heme/Allergies: Positive for seasonal allergies.  Psychiatric/Behavioral: Negative for depression, suicidal ideas and hallucinations.  All other systems reviewed and are negative.   Physical Exam: Vitals:   02/15/17 1421  BP: 123/80  Pulse: (!) 105   Body mass index is 30.73 kg/m. Gen:      No acute distress HEENT:  EOMI, sclera anicteric Neck:     No masses; no thyromegaly Lungs:    Clear to auscultation bilaterally; normal respiratory effort CV:         Regular rate and rhythm; no murmurs Abd:      + bowel sounds; soft, non-tender; no palpable masses, no distension Ext:    No edema; adequate peripheral perfusion Skin:      Warm and dry; no rash Neuro: alert and oriented x 3 Psych: normal mood and affect  Data Reviewed:  Reviewed labs, radiology imaging, old records and pertinent past GI work up   Assessment and Plan/Recommendations: 76 year old female with history of chronic kidney disease, myelodysplastic syndrome, chronic anemia here for follow-up visit requesting band ligation for symptomatic hemorrhoids with intermittent blood per rectum, protruding hemorrhoidal tissue and fecal seepage  On rectal exam patient has partial prolapse of the rectum 4 cm from  anal verge, able to reduce it with minimal pressure. Patient had mild discomfort.  Endoscopy showed erythematous mucosa likely from the prolapse otherwise no ulceration and internal hemorrhoids  We'll refer patient to Dr. Henri Medal for possible rectopexy  Advised patient to avoid excessive straining or sitting on toilet for prolonged period of time Kegel exercises Benefiber 1 tablespoon TID with meals  25 minutes was spent face-to-face with the patient. Greater than 50% of the time used for counseling as well as treatment plan and follow-up of rectal prolapse. She had multiple questions which were answered to her satisfaction  K. Denzil Magnuson , MD [phone] Mon-Fri 8a-5p [phone] after 5p, weekends, holidays  CC: Unk Pinto, MD

## 2017-02-15 NOTE — Patient Instructions (Signed)
We will refer you to Geryl Rankins at [facility] for rectal prolapse , [facility] will contact you with your appointment

## 2017-02-17 ENCOUNTER — Encounter: Payer: Self-pay | Admitting: Gastroenterology

## 2017-02-17 ENCOUNTER — Ambulatory Visit: Payer: Medicare Other

## 2017-02-17 ENCOUNTER — Other Ambulatory Visit (HOSPITAL_BASED_OUTPATIENT_CLINIC_OR_DEPARTMENT_OTHER): Payer: Medicare Other

## 2017-02-17 DIAGNOSIS — D631 Anemia in chronic kidney disease: Secondary | ICD-10-CM | POA: Diagnosis not present

## 2017-02-17 DIAGNOSIS — N183 Chronic kidney disease, stage 3 unspecified: Secondary | ICD-10-CM

## 2017-02-17 DIAGNOSIS — N189 Chronic kidney disease, unspecified: Secondary | ICD-10-CM

## 2017-02-17 LAB — CBC WITH DIFFERENTIAL/PLATELET
BASO%: 0.2 % (ref 0.0–2.0)
BASOS ABS: 0 10*3/uL (ref 0.0–0.1)
EOS%: 1.8 % (ref 0.0–7.0)
Eosinophils Absolute: 0.1 10*3/uL (ref 0.0–0.5)
HEMATOCRIT: 36.3 % (ref 34.8–46.6)
HGB: 12.1 g/dL (ref 11.6–15.9)
LYMPH#: 1.8 10*3/uL (ref 0.9–3.3)
LYMPH%: 32.1 % (ref 14.0–49.7)
MCH: 28.1 pg (ref 25.1–34.0)
MCHC: 33.3 g/dL (ref 31.5–36.0)
MCV: 84.4 fL (ref 79.5–101.0)
MONO#: 0.7 10*3/uL (ref 0.1–0.9)
MONO%: 12.6 % (ref 0.0–14.0)
NEUT#: 2.9 10*3/uL (ref 1.5–6.5)
NEUT%: 53.3 % (ref 38.4–76.8)
Platelets: 147 10*3/uL (ref 145–400)
RBC: 4.3 10*6/uL (ref 3.70–5.45)
RDW: 16.6 % — ABNORMAL HIGH (ref 11.2–14.5)
WBC: 5.5 10*3/uL (ref 3.9–10.3)

## 2017-02-17 LAB — IRON AND TIBC
%SAT: 32 % (ref 21–57)
Iron: 87 ug/dL (ref 41–142)
TIBC: 270 ug/dL (ref 236–444)
UIBC: 183 ug/dL (ref 120–384)

## 2017-02-17 LAB — FERRITIN: Ferritin: 48 ng/ml (ref 9–269)

## 2017-02-17 NOTE — Progress Notes (Signed)
Hemoglobin of 12.1 noted on labs today. Injection not given per protocol order.  Current copy of schedule and labs given to patient and family member. Pt instructed to follow schedule and call office should issues occur. Pt verbalized understanding of instructions.

## 2017-02-20 ENCOUNTER — Telehealth: Payer: Self-pay | Admitting: *Deleted

## 2017-02-20 NOTE — Telephone Encounter (Signed)
Faxed demographics, insurance and last office notes to [facility] today for referral appt with A Thomas for rectal prolapse

## 2017-02-21 NOTE — Telephone Encounter (Signed)
Received appointment date and time from [facility] on patients appointment with Dr Marcello Moores for 03/24/2017 at 10:30am  Patient aware

## 2017-03-01 ENCOUNTER — Other Ambulatory Visit: Payer: Self-pay | Admitting: Internal Medicine

## 2017-03-01 DIAGNOSIS — I10 Essential (primary) hypertension: Secondary | ICD-10-CM

## 2017-03-17 ENCOUNTER — Ambulatory Visit (HOSPITAL_BASED_OUTPATIENT_CLINIC_OR_DEPARTMENT_OTHER): Payer: Medicare Other

## 2017-03-17 ENCOUNTER — Other Ambulatory Visit (HOSPITAL_BASED_OUTPATIENT_CLINIC_OR_DEPARTMENT_OTHER): Payer: Medicare Other

## 2017-03-17 ENCOUNTER — Telehealth: Payer: Self-pay | Admitting: Oncology

## 2017-03-17 ENCOUNTER — Ambulatory Visit (HOSPITAL_BASED_OUTPATIENT_CLINIC_OR_DEPARTMENT_OTHER): Payer: Medicare Other | Admitting: Oncology

## 2017-03-17 VITALS — BP 139/89 | HR 63 | Temp 97.7°F | Resp 17 | Ht 62.0 in | Wt 170.9 lb

## 2017-03-17 DIAGNOSIS — D631 Anemia in chronic kidney disease: Secondary | ICD-10-CM

## 2017-03-17 DIAGNOSIS — N183 Chronic kidney disease, stage 3 unspecified: Secondary | ICD-10-CM

## 2017-03-17 DIAGNOSIS — D509 Iron deficiency anemia, unspecified: Secondary | ICD-10-CM

## 2017-03-17 DIAGNOSIS — N189 Chronic kidney disease, unspecified: Secondary | ICD-10-CM

## 2017-03-17 LAB — CBC WITH DIFFERENTIAL/PLATELET
BASO%: 0.2 % (ref 0.0–2.0)
BASOS ABS: 0 10*3/uL (ref 0.0–0.1)
EOS%: 2.5 % (ref 0.0–7.0)
Eosinophils Absolute: 0.1 10*3/uL (ref 0.0–0.5)
HCT: 28.4 % — ABNORMAL LOW (ref 34.8–46.6)
HGB: 9.6 g/dL — ABNORMAL LOW (ref 11.6–15.9)
LYMPH%: 34.2 % (ref 14.0–49.7)
MCH: 27.6 pg (ref 25.1–34.0)
MCHC: 33.8 g/dL (ref 31.5–36.0)
MCV: 81.6 fL (ref 79.5–101.0)
MONO#: 0.3 10*3/uL (ref 0.1–0.9)
MONO%: 5.7 % (ref 0.0–14.0)
NEUT#: 2.7 10*3/uL (ref 1.5–6.5)
NEUT%: 57.4 % (ref 38.4–76.8)
Platelets: 151 10*3/uL (ref 145–400)
RBC: 3.48 10*6/uL — AB (ref 3.70–5.45)
RDW: 15.5 % — ABNORMAL HIGH (ref 11.2–14.5)
WBC: 4.8 10*3/uL (ref 3.9–10.3)
lymph#: 1.6 10*3/uL (ref 0.9–3.3)
nRBC: 0 % (ref 0–0)

## 2017-03-17 MED ORDER — DARBEPOETIN ALFA 300 MCG/0.6ML IJ SOSY
300.0000 ug | PREFILLED_SYRINGE | INTRAMUSCULAR | Status: DC
  Administered 2017-03-17: 300 ug via SUBCUTANEOUS
  Filled 2017-03-17: qty 0.6

## 2017-03-17 NOTE — Telephone Encounter (Signed)
Scheduled appt per 6/15 los - Gave patient AVS and calender per LOS.  

## 2017-03-17 NOTE — Progress Notes (Signed)
Hematology and Oncology Follow Up Visit  Susan [name] [id number] 1941-09-22 76 y.o. 03/17/2017 1:39 PM Unk Pinto, MDMcKeown, [name], MD   Principle Diagnosis:  76 year old woman with anemia of renal disease diagnosed in 2010. She also has element of early myelodysplasia.  Secondary Diagnosis: Controlled hypertension.  Current therapy: She is on Aranesp 300 mcg every 3 weeks to keep her hemoglobin above 11.  Interim History:  [name] presents today for a follow-up visit. Since her last visit, she reports symptoms of rectal bleeding and incontinence. She was evaluated by gastroenterology and felt that she might have rectal prolapse. She was referred to Dr. Marcello Moores for potential surgical repair. In the meantime she has not reported symptoms of fatigue and dizziness at times without any abdominal pain or vomiting.  She continues to tolerate Aranesp without any major complications. She did not receive her May injection as her hemoglobin was within normal range. Her appetite and performance status is not dramatically changed.  She does not report any headaches, blurry vision, syncope or seizures. She denies bleeding, bruising or lymphadenopathy. He has not reported any fevers or chills or sweats. She has not reported any chest pain or palpitation. Does not report any shortness of breath or wheezing. She does not report any abdominal pain or early satiety.  Rest of her review of systems unremarkable.  Medications: I have reviewed the patient's current medications.  Current Outpatient Prescriptions  Medication Sig Dispense Refill  . amLODipine (NORVASC) 10 MG tablet TAKE 1/2 TO 1 TABLET BY MOUTH EVERY DAY FOR BLOOD PRESSURE 90 tablet 0  . aspirin 81 MG chewable tablet Chew 81 mg by mouth every morning.     [name] azelastine (ASTELIN) 0.1 % nasal spray Place 2 sprays into both nostrils 2 (two) times daily. Use in each nostril as directed 30 mL 2  . Cholecalciferol (VITAMIN D-3) 5000 UNITS TABS  Take 15,000 Units by mouth daily.     . Darbepoetin Alfa-Albumin (ARANESP IJ) Inject as directed every 21 ( twenty-one) days.     . diclofenac (VOLTAREN) 75 MG EC tablet TAKE 1 TABLET(75 MG) BY MOUTH TWICE DAILY 180 tablet 1  . diphenoxylate-atropine (LOMOTIL) 2.5-0.025 MG tablet Take 2 tablets by mouth 4 (four) times daily as needed for diarrhea or loose stools. 30 tablet 0  . ipratropium (ATROVENT) 0.03 % nasal spray Take 1 to 2 sprays into each nostril up to 2 or 3 x day as needed 30 mL 99  . labetalol (NORMODYNE) 300 MG tablet TAKE 1 TABLET BY MOUTH TWICE DAILY 180 tablet 0  . levothyroxine (SYNTHROID, LEVOTHROID) 100 MCG tablet TAKE 1 TABLET BY MOUTH DAILY ON AN EMPTY STOMACH FOR 30 MINUTES 90 tablet 3  . losartan-hydrochlorothiazide (HYZAAR) 100-25 MG per tablet Take 1 tablet daily for BP (Patient taking differently: Take 0.5 tablets by mouth daily. ) 90 tablet 1  . losartan-hydrochlorothiazide (HYZAAR) 100-25 MG tablet TAKE 1 TABLET BY MOUTH DAILY 90 tablet 0  . pantoprazole (PROTONIX) 40 MG tablet TAKE 1 TABLET BY MOUTH EVERY DAY FOR ACID REFLUX 90 tablet 0   Current Facility-Administered Medications  Medication Dose Route Frequency Provider Last Rate Last Dose  . 0.9 %  sodium chloride infusion  500 mL Intravenous Continuous Nandigam, Venia Minks, MD         Past Medical History, Surgical history, Social history, and Family History were reviewed and updated.  [name]  Physical Exam: Blood pressure 139/89, pulse 63, temperature 97.7 F (36.5 C), temperature source Oral, resp.  rate 17, height '5\' 2"'  (1.575 m), weight 170 lb 14.4 oz (77.5 kg), SpO2 100 %. ECOG: 1 General appearance: Alert, awake: Without distress. Head: Normocephalic, without obvious abnormality no oral ulcers or lesions. Neck: no adenopathy Lymph nodes: Cervical, supraclavicular, and axillary nodes normal. Heart:regular rate and rhythm, S1, S2 normal, no murmur, click, rub or gallop Lung:chest clear, no wheezing, rales,  normal symmetric air entry Abdomen: soft, non-tender, without masses or organomegaly no rebound or guarding.   Lab Results: Lab Results  Component Value Date   WBC 4.8 03/17/2017   HGB 9.6 (L) 03/17/2017   HCT 28.4 (L) 03/17/2017   MCV 81.6 03/17/2017   PLT 151 03/17/2017     Chemistry      Component Value Date/Time   NA 143 01/31/2017 1058   K 3.6 01/31/2017 1058   CL 107 01/31/2017 1058   CO2 22 01/31/2017 1058   BUN 32 (H) 01/31/2017 1058   CREATININE 1.57 (H) 01/31/2017 1058      Component Value Date/Time   CALCIUM 9.4 01/31/2017 1058   ALKPHOS 87 01/31/2017 1058   AST 17 01/31/2017 1058   ALT 8 01/31/2017 1058   BILITOT 0.7 01/31/2017 1058      Impression and Plan:   76 year old female with the following issues:  1. 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 She is currently receiving Aranesp every 3 weeks to keep her hemoglobin above 11. Her hemoglobin is 9.6 and she will receive Aranesp today. Iron studies In May 2018 showed normal iron levels. 2. Rectal bleeding and incontinence: She is currently under evaluation for possible surgical repair. She has appointment with Dr. Marcello Moores in the near future. 3. History of questionable myelodysplasia: No evidence of cytopenias and noted other than her anemia. We will continue to monitor her regarding this issue. Repeat bone marrow biopsy may be needed in the future. 4. Follow up. Every 3 weeks for CBC and injection. Clinical visit in 4 months.       [facility] MD 6/15/20181:39 PM

## 2017-03-17 NOTE — Patient Instructions (Signed)
Darbepoetin Alfa injection What is this medicine? DARBEPOETIN ALFA (dar be POE e tin AL fa) helps your body make more red blood cells. It is used to treat anemia caused by chronic kidney failure and chemotherapy. This medicine may be used for other purposes; ask your health care provider or pharmacist if you have questions. COMMON BRAND NAME(S): Aranesp What should I tell my health care provider before I take this medicine? They need to know if you have any of these conditions: -blood clotting disorders or history of blood clots -cancer patient not on chemotherapy -cystic fibrosis -heart disease, such as angina, heart failure, or a history of a heart attack -hemoglobin level of 12 g/dL or greater -high blood pressure -low levels of folate, iron, or vitamin B12 -seizures -an unusual or allergic reaction to darbepoetin, erythropoietin, albumin, hamster proteins, latex, other medicines, foods, dyes, or preservatives -pregnant or trying to get pregnant -breast-feeding How should I use this medicine? This medicine is for injection into a vein or under the skin. It is usually given by a health care professional in a hospital or clinic setting. If you get this medicine at home, you will be taught how to prepare and give this medicine. Do not shake the solution before you withdraw a dose. Use exactly as directed. Take your medicine at regular intervals. Do not take your medicine more often than directed. It is important that you put your used needles and syringes in a special sharps container. Do not put them in a trash can. If you do not have a sharps container, call your pharmacist or healthcare provider to get one. Talk to your pediatrician regarding the use of this medicine in children. While this medicine may be used in children as young as 1 year for selected conditions, precautions do apply. Overdosage: If you think you have taken too much of this medicine contact a poison control center or  emergency room at once. NOTE: This medicine is only for you. Do not share this medicine with others. What if I miss a dose? If you miss a dose, take it as soon as you can. If it is almost time for your next dose, take only that dose. Do not take double or extra doses. What may interact with this medicine? Do not take this medicine with any of the following medications: -epoetin alfa This list may not describe all possible interactions. Give your health care provider a list of all the medicines, herbs, non-prescription drugs, or dietary supplements you use. Also tell them if you smoke, drink alcohol, or use illegal drugs. Some items may interact with your medicine. What should I watch for while using this medicine? Visit your prescriber or health care professional for regular checks on your progress and for the needed blood tests and blood pressure measurements. It is especially important for the doctor to make sure your hemoglobin level is in the desired range, to limit the risk of potential side effects and to give you the best benefit. Keep all appointments for any recommended tests. Check your blood pressure as directed. Ask your doctor what your blood pressure should be and when you should contact him or her. As your body makes more red blood cells, you may need to take iron, folic acid, or vitamin B supplements. Ask your doctor or health care provider which products are right for you. If you have kidney disease continue dietary restrictions, even though this medication can make you feel better. Talk with your doctor or health   care professional about the foods you eat and the vitamins that you take. What side effects may I notice from receiving this medicine? Side effects that you should report to your doctor or health care professional as soon as possible: -allergic reactions like skin rash, itching or hives, swelling of the face, lips, or tongue -breathing problems -changes in vision -chest  pain -confusion, trouble speaking or understanding -feeling faint or lightheaded, falls -high blood pressure -muscle aches or pains -pain, swelling, warmth in the leg -rapid weight gain -severe headaches -sudden numbness or weakness of the face, arm or leg -trouble walking, dizziness, loss of balance or coordination -seizures (convulsions) -swelling of the ankles, feet, hands -unusually weak or tired Side effects that usually do not require medical attention (report to your doctor or health care professional if they continue or are bothersome): -diarrhea -fever, chills (flu-like symptoms) -headaches -nausea, vomiting -redness, stinging, or swelling at site where injected This list may not describe all possible side effects. Call your doctor for medical advice about side effects. You may report side effects to FDA at 1-800-FDA-1088. Where should I keep my medicine? Keep out of the reach of children. Store in a refrigerator between 2 and 8 degrees C (36 and 46 degrees F). Do not freeze. Do not shake. Throw away any unused portion if using a single-dose vial. Throw away any unused medicine after the expiration date. NOTE: This sheet is a summary. It may not cover all possible information. If you have questions about this medicine, talk to your doctor, pharmacist, or health care provider.  2015, [employer]. (2008-09-02 10:23:57)  

## 2017-03-24 ENCOUNTER — Encounter (HOSPITAL_COMMUNITY): Payer: Self-pay

## 2017-03-24 ENCOUNTER — Emergency Department (HOSPITAL_COMMUNITY)
Admission: EM | Admit: 2017-03-24 | Discharge: 2017-03-25 | Disposition: A | Discharge status: Home / Self Care | Payer: Medicare Other | Attending: Emergency Medicine | Admitting: Emergency Medicine

## 2017-03-24 ENCOUNTER — Other Ambulatory Visit: Payer: Self-pay | Admitting: General Surgery

## 2017-03-24 DIAGNOSIS — Z87891 Personal history of nicotine dependence: Secondary | ICD-10-CM | POA: Diagnosis not present

## 2017-03-24 DIAGNOSIS — Z79899 Other long term (current) drug therapy: Secondary | ICD-10-CM | POA: Insufficient documentation

## 2017-03-24 DIAGNOSIS — N183 Chronic kidney disease, stage 3 (moderate): Secondary | ICD-10-CM | POA: Diagnosis not present

## 2017-03-24 DIAGNOSIS — Z7982 Long term (current) use of aspirin: Secondary | ICD-10-CM | POA: Diagnosis not present

## 2017-03-24 DIAGNOSIS — T781XXA Other adverse food reactions, not elsewhere classified, initial encounter: Secondary | ICD-10-CM | POA: Diagnosis not present

## 2017-03-24 DIAGNOSIS — I129 Hypertensive chronic kidney disease with stage 1 through stage 4 chronic kidney disease, or unspecified chronic kidney disease: Secondary | ICD-10-CM | POA: Diagnosis not present

## 2017-03-24 DIAGNOSIS — T7840XA Allergy, unspecified, initial encounter: Secondary | ICD-10-CM | POA: Diagnosis not present

## 2017-03-24 DIAGNOSIS — E039 Hypothyroidism, unspecified: Secondary | ICD-10-CM | POA: Insufficient documentation

## 2017-03-24 DIAGNOSIS — K642 Third degree hemorrhoids: Secondary | ICD-10-CM | POA: Diagnosis not present

## 2017-03-24 MED ORDER — METHYLPREDNISOLONE SODIUM SUCC 125 MG IJ SOLR
125.0000 mg | Freq: Once | INTRAMUSCULAR | Status: AC
  Administered 2017-03-24: 125 mg via INTRAVENOUS
  Filled 2017-03-24: qty 2

## 2017-03-24 MED ORDER — FAMOTIDINE IN NACL 20-0.9 MG/50ML-% IV SOLN
20.0000 mg | Freq: Once | INTRAVENOUS | Status: AC
  Administered 2017-03-24: 20 mg via INTRAVENOUS
  Filled 2017-03-24: qty 50

## 2017-03-24 MED ORDER — DIPHENHYDRAMINE HCL 50 MG/ML IJ SOLN
12.5000 mg | Freq: Once | INTRAMUSCULAR | Status: AC
  Administered 2017-03-24: 12.5 mg via INTRAVENOUS
  Filled 2017-03-24: qty 1

## 2017-03-24 NOTE — ED Triage Notes (Signed)
Pt states ate yellow rice tonight. Pt complaining of swelling in throat and itching/hives across body. Pt with rash to abdomen. Pt airway intact at triage. Pt sats 100% on RA.

## 2017-03-24 NOTE — ED Notes (Signed)
ED Provider at bedside. 

## 2017-03-25 DIAGNOSIS — T781XXA Other adverse food reactions, not elsewhere classified, initial encounter: Secondary | ICD-10-CM | POA: Diagnosis not present

## 2017-03-25 MED ORDER — FAMOTIDINE 20 MG PO TABS: 20.0000 mg | ORAL_TABLET | Freq: Two times a day (BID) | ORAL | 0 refills | Status: DC

## 2017-03-25 MED ORDER — DIPHENHYDRAMINE HCL 50 MG/ML IJ SOLN
12.5000 mg | Freq: Once | INTRAMUSCULAR | Status: AC
  Administered 2017-03-25: 12.5 mg via INTRAVENOUS
  Filled 2017-03-25: qty 1

## 2017-03-25 MED ORDER — PREDNISONE 20 MG PO TABS: 40.0000 mg | ORAL_TABLET | Freq: Every day | ORAL | 0 refills | Status: AC

## 2017-03-25 MED ORDER — DIPHENHYDRAMINE HCL 25 MG PO CAPS: ORAL_CAPSULE | ORAL | 0 refills | Status: DC

## 2017-03-25 NOTE — ED Provider Notes (Signed)
[facility] DEPT Provider Note   CSN: [id number] Arrival date & time: 03/24/17  2024     History   Chief Complaint Chief Complaint  Patient presents with  . Allergic Reaction    HPI Susan [name] is a 76 y.o. female.  Patient presents with generalized rash and itching, right facial swelling and throat tightening after eating yellow rice earlier this evening. No shortness of breath, vomiting. She reports symptoms are not worsening over time. She did not take anything prior to arrival for symptoms.   The history is provided by the patient. No language interpreter was used.  Allergic Reaction  Presenting symptoms: rash   Presenting symptoms: no difficulty swallowing     Past Medical History:  Diagnosis Date  . Anemia   . Arthritis   . Blood transfusion without reported diagnosis   . GERD (gastroesophageal reflux disease)   . HLD (hyperlipidemia)   . Hypertension   . Hypothyroidism   . Myelodysplasia   . Vitamin D deficiency     Patient Active Problem List   Diagnosis Date Noted  . 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 BMI 30.0-30.9,adult 09/02/2015  . Encounter for Medicare annual wellness exam 06/02/2015  . 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[place]) 11/03/2014  . CKD, 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) 11/02/2014  . Prediabetes 01/27/2014  . Medication management 01/27/2014  . Hyperlipidemia 08/27/2013  . Essential hypertension   . Hypothyroidism   . GERD (gastroesophageal reflux disease)   . Vitamin D deficiency   . Hemorrhoids 07/15/2013    Past Surgical History:  Procedure Laterality Date  . CARPAL TUNNEL RELEASE Right   . DILATION AND CURETTAGE OF UTERUS    . INGUINAL HERNIA REPAIR    . ROTATOR CUFF REPAIR Left   . UMBILICAL HERNIA REPAIR      OB History    No data available       Home Medications    Prior to Admission medications   Medication Sig Start Date End Date Taking? Authorizing Provider  amLODipine (NORVASC) 10 MG tablet TAKE 1/2 TO 1 TABLET BY  MOUTH EVERY DAY FOR BLOOD PRESSURE Patient taking differently: TAKE 1/2 TABLET BY MOUTH DAILY AT BEDTIME; (TAKE 1 TABLET IF SBP IS >150) 01/17/17  Yes Unk Pinto, MD  aspirin 81 MG chewable tablet Chew 81 mg by mouth daily.    Yes [provider]  Cholecalciferol (VITAMIN D-3) 5000 UNITS TABS Take 15,000 Units by mouth daily.    Yes [provider]  Darbepoetin Alfa (ARANESP, ALBUMIN FREE,) 300 MCG/ML SOLN Inject 300 mcg into the skin See admin instructions. USE INJECTION EVERY 3 WEEKS AS NEEDED FOR HEMOGLOBIN LESS THAN 11.   Yes [provider]  diclofenac (VOLTAREN) 75 MG EC tablet TAKE 1 TABLET(75 MG) BY MOUTH TWICE DAILY 12/03/16  Yes Unk Pinto, MD  hydroxypropyl methylcellulose / hypromellose (ISOPTO TEARS / GONIOVISC) 2.5 % ophthalmic solution Place 1-2 drops into both eyes 3 (three) times daily as needed for dry eyes.   Yes [provider]  labetalol (NORMODYNE) 300 MG tablet TAKE 1 TABLET BY MOUTH TWICE DAILY 03/01/17  Yes Unk Pinto, MD  levothyroxine (SYNTHROID, LEVOTHROID) 100 MCG tablet TAKE 1 TABLET BY MOUTH DAILY ON AN EMPTY STOMACH FOR 30 MINUTES 04/19/16  Yes Unk Pinto, MD  losartan-hydrochlorothiazide [facility]) 100-25 MG tablet TAKE 1 TABLET BY MOUTH DAILY 02/15/17  Yes Unk Pinto, MD  Magnesium 500 MG TABS Take 500 mg by mouth at bedtime as needed (leg cramps).  Yes [provider]  Menthol-Methyl Salicylate (EQL COOL HEAT EXTRA STRENGTH EX) Apply 1 application topically 3 (three) times daily as needed (for pain.).   Yes [provider]  pantoprazole (PROTONIX) 40 MG tablet TAKE 1 TABLET BY MOUTH EVERY DAY FOR ACID REFLUX 02/15/17  Yes Unk Pinto, MD  acetaminophen (TYLENOL) 500 MG tablet Take 500-1,000 mg by mouth every 6 (six) hours as needed (for pain.).    [provider]  diphenhydrAMINE (BENADRYL) 25 mg capsule One capsule every 6 hours x 3 days, then as needed for symptoms of  rash/itching 03/25/17   Charlann Lange, PA-C  famotidine (PEPCID) 20 MG tablet Take 1 tablet (20 mg total) by mouth 2 (two) times daily. X 3 days, then as needed for itching/rash 03/25/17   Charlann Lange, PA-C  predniSONE (DELTASONE) 20 MG tablet Take 2 tablets (40 mg total) by mouth daily. 03/25/17 03/30/17  Charlann Lange, PA-C    Family History Family History  Problem Relation Age of Onset  . Stroke Father   . Hypertension Father   . Other Mother        died at early age  . Stroke Sister   . Coronary artery disease Brother   . Hypertension Sister   . Bladder Cancer Sister   . Colon cancer Neg Hx   . Stomach cancer Neg Hx     Social History Social History  Substance Use Topics  . Smoking status: Former Smoker    Types: Cigarettes    Quit date: 10/02/1996  . Smokeless tobacco: Never Used  . Alcohol use 0.0 oz/week     Comment: glass of wine at least once a month.     Allergies   Codeine; Penicillins; Bactrim; Clarithromycin; Flagyl [metronidazole hcl]; Food; Pineapple; and Sulfa antibiotics   Review of Systems Review of Systems  Constitutional: Negative for chills and diaphoresis.  HENT: Positive for facial swelling. Negative for trouble swallowing.        See HPI.  Respiratory: Negative.  Negative for shortness of breath.   Cardiovascular: Negative.  Negative for chest pain.  Gastrointestinal: Negative.  Negative for nausea and vomiting.  Musculoskeletal: Negative.   Skin: Positive for rash.  Neurological: Negative.      Physical Exam Updated Vital Signs BP 126/80 (BP Location: Right Arm)   Pulse 80   Temp 98 F (36.7 C) (Oral)   Resp 16   SpO2 99%   Physical Exam  Constitutional: She is oriented to person, place, and time. She appears well-developed and well-nourished.  HENT:  Head: Normocephalic.  Very mild right facial swelling without discoloration. Non-tender. Oropharynx benign. No tongue enlargement.   Neck: Normal range of motion. Neck supple.    Cardiovascular: Normal rate and regular rhythm.   No murmur heard. Pulmonary/Chest: Effort normal and breath sounds normal. No stridor. She has no wheezes. She has no rales.  Abdominal: Soft. Bowel sounds are normal. There is no tenderness. There is no rebound and no guarding.  Musculoskeletal: Normal range of motion.  Neurological: She is alert and oriented to person, place, and time.  Skin: Skin is warm and dry.  Slightly raised erythematous rash to extremities c/w hives.  Psychiatric: She has a normal mood and affect.     ED Treatments / Results  Labs (all labs ordered are listed, but only abnormal results are displayed) Labs Reviewed - No data to display  EKG  EKG Interpretation None       Radiology No results found.  Procedures  Procedures (including critical care time)  Medications Ordered in ED Medications  famotidine (PEPCID) IVPB 20 mg premix (0 mg Intravenous Stopped 03/24/17 2347)  methylPREDNISolone sodium succinate (SOLU-MEDROL) 125 mg/2 mL injection 125 mg (125 mg Intravenous Given 03/24/17 2314)  diphenhydrAMINE (BENADRYL) injection 12.5 mg (12.5 mg Intravenous Given 03/24/17 2315)  diphenhydrAMINE (BENADRYL) injection 12.5 mg (12.5 mg Intravenous Given 03/25/17 0030)     Initial Impression / Assessment and Plan / ED Course  I have reviewed the triage vital signs and the nursing notes.  Pertinent labs & imaging results that were available during my care of the patient were reviewed by me and considered in my medical decision making (see chart for details).     Patient presents with acute allergic reaction that is mild. No SOB. Given IV solumedrol, benadryl, pepcid with relief of symptoms. She is co-evaluated by Dr. Jeneen Rinks and is felt appropriate for discharge. Return precautions discussed.   Final Clinical Impressions(s) / ED Diagnoses   Final diagnoses:  Allergic reaction, initial encounter    New Prescriptions New Prescriptions   DIPHENHYDRAMINE  (BENADRYL) 25 MG CAPSULE    One capsule every 6 hours x 3 days, then as needed for symptoms of rash/itching   FAMOTIDINE (PEPCID) 20 MG TABLET    Take 1 tablet (20 mg total) by mouth 2 (two) times daily. X 3 days, then as needed for itching/rash   PREDNISONE (DELTASONE) 20 MG TABLET    Take 2 tablets (40 mg total) by mouth daily.     Charlann Lange, PA-C 03/25/17 0147    Tanna Furry, MD 04/06/17 1014

## 2017-03-25 NOTE — ED Provider Notes (Signed)
Pt seen and evaluated. DW S. [place] Pa. Pt with urticaria after unknown antigen. Given benadryl, pepcid, and S. Medrol. Remains stable. No airsay sx or findings.  Appropriate for Dc. Will plan 48 hours of benadryl and prednisone.   Tanna Furry, MD 03/25/17 [phone]

## 2017-03-25 NOTE — ED Notes (Signed)
Pt verbalized understanding of d/c instructions and has no further questions. Pt stable and NAD. VSS.  

## 2017-03-27 NOTE — Patient Instructions (Addendum)
Susan [name]  03/27/2017   Your procedure is scheduled on: 03-31-17   Report to [facility] Main Entrance Take [place]  Elevators to 3rd floor to [place] at 5:15 AM.   Call this number if you have problems the morning of surgery [phone]    Remember: ONLY 1 PERSON MAY GO WITH YOU TO SHORT STAY TO GET  READY MORNING OF [place].    Do not eat food or drink liquids :After Midnight.     Take these medicines the morning of surgery with A SIP OF WATER: Labetalol (Normodyne), Levothyroxine (Synthroid), and Pantoprazole (Protonix)                               You may not have any metal on your body including hair pins and              piercings  Do not wear jewelry, make-up, lotions, powders or perfumes, deodorant             Do not wear nail polish.  Do not shave  48 hours prior to surgery.     Do not bring valuables to the hospital. [place].  Contacts, dentures or bridgework may not be worn into surgery.  .     Patients discharged the day of surgery will not be allowed to drive home.  Name and phone number of your driver: Jeneen Rinks [phone]                Please read over the following fact sheets you were given: _____________________________________________________________________             [facility] - Preparing for Surgery Before surgery, you can play an important role.  Because skin is not sterile, your skin needs to be as free of germs as possible.  You can reduce the number of germs on your skin by washing with CHG (chlorahexidine gluconate) soap before surgery.  CHG is an antiseptic cleaner which kills germs and bonds with the skin to continue killing germs even after washing. Please DO NOT use if you have an allergy to CHG or antibacterial soaps.  If your skin becomes reddened/irritated stop using the CHG and inform your nurse when you arrive at Short Stay. Do not shave (including  legs and underarms) for at least 48 hours prior to the first CHG shower.  You may shave your face/neck. Please follow these instructions carefully:  1.  Shower with CHG Soap the night before surgery and the  morning of Surgery.  2.  If you choose to wash your hair, wash your hair first as usual with your  normal  shampoo.  3.  After you shampoo, rinse your hair and body thoroughly to remove the  shampoo.                           4.  Use CHG as you would any other liquid soap.  You can apply chg directly  to the skin and wash                       Gently with a scrungie or clean washcloth.  5.  Apply  the CHG Soap to your body ONLY FROM THE NECK DOWN.   Do not use on face/ open                           Wound or open sores. Avoid contact with eyes, ears mouth and genitals (private parts).                       Wash face,  Genitals (private parts) with your normal soap.             6.  Wash thoroughly, paying special attention to the area where your surgery  will be performed.  7.  Thoroughly rinse your body with warm water from the neck down.  8.  DO NOT shower/wash with your normal soap after using and rinsing off  the CHG Soap.                9.  Pat yourself dry with a clean towel.            10.  Wear clean pajamas.            11.  Place clean sheets on your bed the night of your first shower and do not  sleep with pets. Day of Surgery : Do not apply any lotions/deodorants the morning of surgery.  Please wear clean clothes to the hospital/surgery center.  FAILURE TO FOLLOW THESE INSTRUCTIONS MAY RESULT IN THE CANCELLATION OF YOUR SURGERY PATIENT SIGNATURE_________________________________  NURSE SIGNATURE__________________________________  ________________________________________________________________________

## 2017-03-27 NOTE — Progress Notes (Signed)
07-09-16 (EPIC) EKG

## 2017-03-28 ENCOUNTER — Encounter (HOSPITAL_COMMUNITY): Payer: Self-pay

## 2017-03-28 ENCOUNTER — Encounter (HOSPITAL_COMMUNITY)
Admission: RE | Admit: 2017-03-28 | Discharge: 2017-03-28 | Disposition: A | Discharge status: Home / Self Care | Payer: Medicare Other | Source: Ambulatory Visit | Attending: General Surgery | Admitting: General Surgery

## 2017-03-28 DIAGNOSIS — Z79899 Other long term (current) drug therapy: Secondary | ICD-10-CM | POA: Diagnosis not present

## 2017-03-28 DIAGNOSIS — M1990 Unspecified osteoarthritis, unspecified site: Secondary | ICD-10-CM | POA: Diagnosis not present

## 2017-03-28 DIAGNOSIS — Z01812 Encounter for preprocedural laboratory examination: Secondary | ICD-10-CM

## 2017-03-28 DIAGNOSIS — Z8261 Family history of arthritis: Secondary | ICD-10-CM | POA: Diagnosis not present

## 2017-03-28 DIAGNOSIS — K648 Other hemorrhoids: Secondary | ICD-10-CM | POA: Insufficient documentation

## 2017-03-28 DIAGNOSIS — Z8249 Family history of ischemic heart disease and other diseases of the circulatory system: Secondary | ICD-10-CM | POA: Diagnosis not present

## 2017-03-28 DIAGNOSIS — E039 Hypothyroidism, unspecified: Secondary | ICD-10-CM | POA: Diagnosis not present

## 2017-03-28 DIAGNOSIS — K219 Gastro-esophageal reflux disease without esophagitis: Secondary | ICD-10-CM | POA: Diagnosis not present

## 2017-03-28 DIAGNOSIS — K642 Third degree hemorrhoids: Secondary | ICD-10-CM | POA: Diagnosis not present

## 2017-03-28 DIAGNOSIS — Z87891 Personal history of nicotine dependence: Secondary | ICD-10-CM | POA: Diagnosis not present

## 2017-03-28 DIAGNOSIS — I10 Essential (primary) hypertension: Secondary | ICD-10-CM | POA: Diagnosis not present

## 2017-03-28 DIAGNOSIS — Z791 Long term (current) use of non-steroidal anti-inflammatories (NSAID): Secondary | ICD-10-CM | POA: Diagnosis not present

## 2017-03-28 LAB — CBC
HCT: 30.1 % — ABNORMAL LOW (ref 36.0–46.0)
Hemoglobin: 10.4 g/dL — ABNORMAL LOW (ref 12.0–15.0)
MCH: 28.3 pg (ref 26.0–34.0)
MCHC: 34.6 g/dL (ref 30.0–36.0)
MCV: 82 fL (ref 78.0–100.0)
PLATELETS: 184 10*3/uL (ref 150–400)
RBC: 3.67 MIL/uL — ABNORMAL LOW (ref 3.87–5.11)
RDW: 16.9 % — AB (ref 11.5–15.5)
WBC: 8.7 10*3/uL (ref 4.0–10.5)

## 2017-03-28 LAB — BASIC METABOLIC PANEL
Anion gap: 10 (ref 5–15)
BUN: 33 mg/dL — AB (ref 6–20)
CHLORIDE: 104 mmol/L (ref 101–111)
CO2: 24 mmol/L (ref 22–32)
CREATININE: 1.61 mg/dL — AB (ref 0.44–1.00)
Calcium: 9.8 mg/dL (ref 8.9–10.3)
GFR calc Af Amer: 34 mL/min — ABNORMAL LOW (ref >60)
GFR calc non Af Amer: 30 mL/min — ABNORMAL LOW (ref >60)
GLUCOSE: 98 mg/dL (ref 65–99)
Potassium: 4.2 mmol/L (ref 3.5–5.1)
SODIUM: 138 mmol/L (ref 135–145)

## 2017-03-28 NOTE — Progress Notes (Signed)
03-28-17 BMP result, routed to Dr. Marcello Moores for review.

## 2017-03-31 ENCOUNTER — Ambulatory Visit (HOSPITAL_COMMUNITY): Payer: Medicare Other | Admitting: Anesthesiology

## 2017-03-31 ENCOUNTER — Ambulatory Visit (HOSPITAL_COMMUNITY)
Admission: RE | Admit: 2017-03-31 | Discharge: 2017-03-31 | Disposition: A | Discharge status: Home / Self Care | Payer: Medicare Other | Source: Ambulatory Visit | Attending: General Surgery | Admitting: General Surgery

## 2017-03-31 ENCOUNTER — Encounter (HOSPITAL_COMMUNITY)
Admission: RE | Disposition: A | Discharge status: Home / Self Care | Payer: Self-pay | Source: Ambulatory Visit | Attending: General Surgery

## 2017-03-31 ENCOUNTER — Encounter (HOSPITAL_COMMUNITY): Payer: Self-pay | Admitting: *Deleted

## 2017-03-31 DIAGNOSIS — K642 Third degree hemorrhoids: Secondary | ICD-10-CM | POA: Diagnosis not present

## 2017-03-31 DIAGNOSIS — E039 Hypothyroidism, unspecified: Secondary | ICD-10-CM | POA: Insufficient documentation

## 2017-03-31 DIAGNOSIS — I10 Essential (primary) hypertension: Secondary | ICD-10-CM | POA: Insufficient documentation

## 2017-03-31 DIAGNOSIS — Z8249 Family history of ischemic heart disease and other diseases of the circulatory system: Secondary | ICD-10-CM | POA: Insufficient documentation

## 2017-03-31 DIAGNOSIS — Z79899 Other long term (current) drug therapy: Secondary | ICD-10-CM | POA: Insufficient documentation

## 2017-03-31 DIAGNOSIS — Z8261 Family history of arthritis: Secondary | ICD-10-CM | POA: Insufficient documentation

## 2017-03-31 DIAGNOSIS — K648 Other hemorrhoids: Secondary | ICD-10-CM | POA: Diagnosis not present

## 2017-03-31 DIAGNOSIS — Z791 Long term (current) use of non-steroidal anti-inflammatories (NSAID): Secondary | ICD-10-CM | POA: Diagnosis not present

## 2017-03-31 DIAGNOSIS — M1990 Unspecified osteoarthritis, unspecified site: Secondary | ICD-10-CM | POA: Diagnosis not present

## 2017-03-31 DIAGNOSIS — E785 Hyperlipidemia, unspecified: Secondary | ICD-10-CM | POA: Diagnosis not present

## 2017-03-31 DIAGNOSIS — K219 Gastro-esophageal reflux disease without esophagitis: Secondary | ICD-10-CM | POA: Insufficient documentation

## 2017-03-31 DIAGNOSIS — Z87891 Personal history of nicotine dependence: Secondary | ICD-10-CM | POA: Diagnosis not present

## 2017-03-31 HISTORY — PX: TRANSANAL HEMORRHOIDAL DEARTERIALIZATION: SHX6136

## 2017-03-31 SURGERY — TRANSANAL HEMORRHOIDAL DEARTERIALIZATION
Anesthesia: General

## 2017-03-31 MED ORDER — SODIUM CHLORIDE 0.9% FLUSH: 3.0000 mL | INTRAVENOUS | Status: DC | PRN

## 2017-03-31 MED ORDER — SODIUM CHLORIDE 0.9% FLUSH: 3.0000 mL | Freq: Two times a day (BID) | INTRAVENOUS | Status: DC

## 2017-03-31 MED ORDER — LACTATED RINGERS IV SOLN
INTRAVENOUS | Status: DC | PRN
  Administered 2017-03-31: 07:00:00 via INTRAVENOUS

## 2017-03-31 MED ORDER — GABAPENTIN 300 MG PO CAPS
300.0000 mg | ORAL_CAPSULE | ORAL | Status: AC
  Administered 2017-03-31: 300 mg via ORAL
  Filled 2017-03-31: qty 1

## 2017-03-31 MED ORDER — ACETAMINOPHEN 160 MG/5ML PO SOLN: 325.0000 mg | ORAL | Status: DC | PRN

## 2017-03-31 MED ORDER — ONDANSETRON HCL 4 MG/2ML IJ SOLN
INTRAMUSCULAR | Status: DC | PRN
  Administered 2017-03-31: 4 mg via INTRAVENOUS

## 2017-03-31 MED ORDER — KETOROLAC TROMETHAMINE 30 MG/ML IJ SOLN
30.0000 mg | Freq: Once | INTRAMUSCULAR | Status: DC | PRN
Start: 2017-03-31 — End: 2017-03-31

## 2017-03-31 MED ORDER — FENTANYL CITRATE (PF) 100 MCG/2ML IJ SOLN
INTRAMUSCULAR | Status: DC | PRN
  Administered 2017-03-31 (×2): 25 ug via INTRAVENOUS

## 2017-03-31 MED ORDER — PROPOFOL 10 MG/ML IV BOLUS
INTRAVENOUS | Status: AC
  Filled 2017-03-31: qty 20

## 2017-03-31 MED ORDER — FENTANYL CITRATE (PF) 100 MCG/2ML IJ SOLN: 25.0000 ug | INTRAMUSCULAR | Status: DC | PRN

## 2017-03-31 MED ORDER — FENTANYL CITRATE (PF) 100 MCG/2ML IJ SOLN
INTRAMUSCULAR | Status: AC
  Filled 2017-03-31: qty 4

## 2017-03-31 MED ORDER — LIDOCAINE 2% (20 MG/ML) 5 ML SYRINGE
INTRAMUSCULAR | Status: DC | PRN
  Administered 2017-03-31: 100 mg via INTRAVENOUS

## 2017-03-31 MED ORDER — ONDANSETRON HCL 4 MG/2ML IJ SOLN
INTRAMUSCULAR | Status: AC
  Filled 2017-03-31: qty 2

## 2017-03-31 MED ORDER — SODIUM CHLORIDE 0.9 % IJ SOLN
INTRAMUSCULAR | Status: DC | PRN
  Administered 2017-03-31: 10 mL

## 2017-03-31 MED ORDER — LIDOCAINE 2% (20 MG/ML) 5 ML SYRINGE
INTRAMUSCULAR | Status: AC
  Filled 2017-03-31: qty 5

## 2017-03-31 MED ORDER — BUPIVACAINE-EPINEPHRINE (PF) 0.25% -1:200000 IJ SOLN
INTRAMUSCULAR | Status: AC
  Filled 2017-03-31: qty 30

## 2017-03-31 MED ORDER — SUCCINYLCHOLINE CHLORIDE 200 MG/10ML IV SOSY
PREFILLED_SYRINGE | INTRAVENOUS | Status: AC
  Filled 2017-03-31: qty 10

## 2017-03-31 MED ORDER — BUPIVACAINE LIPOSOME 1.3 % IJ SUSP
20.0000 mL | Freq: Once | INTRAMUSCULAR | Status: AC
  Administered 2017-03-31: 20 mL
  Filled 2017-03-31: qty 20

## 2017-03-31 MED ORDER — SODIUM CHLORIDE 0.9 % IV SOLN: 250.0000 mL | INTRAVENOUS | Status: DC | PRN

## 2017-03-31 MED ORDER — BUPIVACAINE-EPINEPHRINE 0.5% -1:200000 IJ SOLN
INTRAMUSCULAR | Status: DC | PRN
Start: 2017-03-31 — End: 2017-03-31
  Administered 2017-03-31: 30 mL

## 2017-03-31 MED ORDER — ROCURONIUM BROMIDE 50 MG/5ML IV SOSY
PREFILLED_SYRINGE | INTRAVENOUS | Status: AC
  Filled 2017-03-31: qty 5

## 2017-03-31 MED ORDER — EPHEDRINE 5 MG/ML INJ
INTRAVENOUS | Status: AC
  Filled 2017-03-31: qty 10

## 2017-03-31 MED ORDER — PROPOFOL 10 MG/ML IV BOLUS
INTRAVENOUS | Status: DC | PRN
  Administered 2017-03-31: 150 mg via INTRAVENOUS

## 2017-03-31 MED ORDER — DEXAMETHASONE SODIUM PHOSPHATE 10 MG/ML IJ SOLN
INTRAMUSCULAR | Status: AC
  Filled 2017-03-31: qty 1

## 2017-03-31 MED ORDER — ACETAMINOPHEN 325 MG PO TABS: 325.0000 mg | ORAL_TABLET | ORAL | Status: DC | PRN

## 2017-03-31 MED ORDER — ACETAMINOPHEN 325 MG PO TABS: 650.0000 mg | ORAL_TABLET | ORAL | Status: DC | PRN

## 2017-03-31 MED ORDER — ONDANSETRON HCL 4 MG/2ML IJ SOLN: 4.0000 mg | Freq: Once | INTRAMUSCULAR | Status: DC | PRN

## 2017-03-31 MED ORDER — SODIUM CHLORIDE 0.9 % IJ SOLN
INTRAMUSCULAR | Status: AC
Start: 2017-03-31 — End: ?
  Filled 2017-03-31: qty 10

## 2017-03-31 MED ORDER — ACETAMINOPHEN 500 MG PO TABS
1000.0000 mg | ORAL_TABLET | ORAL | Status: AC
  Administered 2017-03-31: 1000 mg via ORAL
  Filled 2017-03-31: qty 2

## 2017-03-31 MED ORDER — HYDROCODONE-ACETAMINOPHEN 5-325 MG PO TABS: 1.0000 | ORAL_TABLET | ORAL | 0 refills | Status: DC | PRN

## 2017-03-31 MED ORDER — ACETAMINOPHEN 650 MG RE SUPP
650.0000 mg | RECTAL | Status: DC | PRN
  Filled 2017-03-31: qty 1

## 2017-03-31 MED ORDER — FENTANYL CITRATE (PF) 100 MCG/2ML IJ SOLN
INTRAMUSCULAR | Status: AC
  Filled 2017-03-31: qty 2

## 2017-03-31 MED ORDER — DEXAMETHASONE SODIUM PHOSPHATE 10 MG/ML IJ SOLN
INTRAMUSCULAR | Status: DC | PRN
  Administered 2017-03-31: 10 mg via INTRAVENOUS

## 2017-03-31 MED ORDER — EPHEDRINE SULFATE-NACL 50-0.9 MG/10ML-% IV SOSY
PREFILLED_SYRINGE | INTRAVENOUS | Status: DC | PRN
  Administered 2017-03-31: 10 mg via INTRAVENOUS

## 2017-03-31 MED ORDER — MEPERIDINE HCL 50 MG/ML IJ SOLN: 6.2500 mg | INTRAMUSCULAR | Status: DC | PRN

## 2017-03-31 SURGICAL SUPPLY — 28 items
BLADE HEX COATED 2.75 (ELECTRODE) ×3 IMPLANT
BRIEF STRETCH FOR OB PAD LRG (UNDERPADS AND DIAPERS) IMPLANT
DECANTER SPIKE VIAL GLASS SM (MISCELLANEOUS) ×3 IMPLANT
DRAPE SHEET LG 3/4 BI-LAMINATE (DRAPES) ×3 IMPLANT
DRSG PAD ABDOMINAL 8X10 ST (GAUZE/BANDAGES/DRESSINGS) IMPLANT
ELECT PENCIL ROCKER SW 15FT (MISCELLANEOUS) ×3 IMPLANT
ELECT REM PT RETURN 15FT ADLT (MISCELLANEOUS) ×3 IMPLANT
GAUZE SPONGE 4X4 12PLY STRL (GAUZE/BANDAGES/DRESSINGS) ×3 IMPLANT
GAUZE SPONGE 4X4 16PLY XRAY LF (GAUZE/BANDAGES/DRESSINGS) ×3 IMPLANT
GLOVE BIO SURGEON STRL SZ 6.5 (GLOVE) ×2 IMPLANT
GLOVE BIO SURGEONS STRL SZ 6.5 (GLOVE) ×1
GLOVE BIOGEL PI IND STRL 7.0 (GLOVE) ×1 IMPLANT
GLOVE BIOGEL PI INDICATOR 7.0 (GLOVE) ×2
GOWN STRL REUS W/TWL 2XL LVL3 (GOWN DISPOSABLE) ×3 IMPLANT
GOWN STRL REUS W/TWL XL LVL3 (GOWN DISPOSABLE) ×3 IMPLANT
HEMOSTAT SURGICEL 4X8 (HEMOSTASIS) IMPLANT
KIT SLIDE ONE PROLAPS HEMORR (KITS) ×3 IMPLANT
LUBRICANT JELLY K Y 4OZ (MISCELLANEOUS) ×3 IMPLANT
NEEDLE HYPO 22GX1.5 SAFETY (NEEDLE) ×3 IMPLANT
PACK LITHOTOMY IV (CUSTOM PROCEDURE TRAY) ×3 IMPLANT
SPONGE HEMORRHOID 8X3CM (HEMOSTASIS) IMPLANT
SPONGE SURGIFOAM ABS GEL 100 (HEMOSTASIS) IMPLANT
SUT CHROMIC 2 0 SH (SUTURE) IMPLANT
SUT CHROMIC 3 0 SH 27 (SUTURE) IMPLANT
SUT VIC AB 2-0 UR6 27 (SUTURE) IMPLANT
SYR 20CC LL (SYRINGE) ×3 IMPLANT
TOWEL OR 17X26 10 PK STRL BLUE (TOWEL DISPOSABLE) ×3 IMPLANT
TOWEL OR NON WOVEN STRL DISP B (DISPOSABLE) ×3 IMPLANT

## 2017-03-31 NOTE — Transfer of Care (Signed)
Immediate Anesthesia Transfer of Care Note  Patient: Susan [name]  Procedure(s) Performed: Procedure(s): TRANSANAL HEMORRHOIDAL DEARTERIALIZATION (N/A)  Patient Location: PACU  Anesthesia Type:General  Level of Consciousness: sedated  Airway & Oxygen Therapy: Patient Spontanous Breathing and Patient connected to face mask oxygen  Post-op Assessment: Report given to RN and Post -op Vital signs reviewed and stable  Post vital signs: Reviewed and stable  Last Vitals:  Vitals:   03/31/17 0509  BP: 140/75  Pulse: 61  Resp: 18  Temp: 36.6 C    Last Pain:  Vitals:   03/31/17 0523  TempSrc:   PainSc: 3       Patients Stated Pain Goal: 3 (35/45/62 5638)  Complications: No apparent anesthesia complications

## 2017-03-31 NOTE — Anesthesia Procedure Notes (Signed)
Procedure Name: LMA Insertion Date/Time: 03/31/2017 7:22 AM Performed by: Lind Covert Pre-anesthesia Checklist: Patient identified, Emergency Drugs available, Suction available, Patient being monitored and Timeout performed Patient Re-evaluated:Patient Re-evaluated prior to inductionOxygen Delivery Method: Circle system utilized Preoxygenation: Pre-oxygenation with 100% oxygen Intubation Type: IV induction LMA: LMA inserted LMA Size: 4.0 Number of attempts: 1 Placement Confirmation: positive ETCO2 and breath sounds checked- equal and bilateral Tube secured with: Tape Dental Injury: Teeth and Oropharynx as per pre-operative assessment

## 2017-03-31 NOTE — Anesthesia Preprocedure Evaluation (Addendum)
Anesthesia Evaluation  Patient identified by MRN, date of birth, ID band Patient awake    Reviewed: Allergy & Precautions, NPO status , Patient's Chart, lab work & pertinent test results  Airway Mallampati: I       Dental  (+) Edentulous Upper, Poor Dentition, Missing   Pulmonary former smoker,    Pulmonary exam normal breath sounds clear to auscultation       Cardiovascular hypertension, Pt. on medications and Pt. on home beta blockers Normal cardiovascular exam Rhythm:Regular Rate:Normal     Neuro/Psych negative neurological ROS  negative psych ROS   GI/Hepatic GERD  Medicated and Controlled,  Endo/Other  Hypothyroidism   Renal/GU      Musculoskeletal   Abdominal Normal abdominal exam  (+)   Peds  Hematology  (+) Blood dyscrasia, anemia ,   Anesthesia Other Findings   Reproductive/Obstetrics                             Anesthesia Physical Anesthesia Plan  ASA: II  Anesthesia Plan: General   Post-op Pain Management:    Induction: Intravenous  PONV Risk Score and Plan: 3 and Ondansetron, Dexamethasone, Propofol, Midazolam and Treatment may vary due to age or medical condition  Airway Management Planned: LMA  Additional Equipment:   Intra-op Plan:   Post-operative Plan:   Informed Consent: I have reviewed the patients History and Physical, chart, labs and discussed the procedure including the risks, benefits and alternatives for the proposed anesthesia with the patient or authorized representative who has indicated his/her understanding and acceptance.     Plan Discussed with: CRNA and Surgeon  Anesthesia Plan Comments:        Anesthesia Quick Evaluation

## 2017-03-31 NOTE — Anesthesia Postprocedure Evaluation (Signed)
Anesthesia Post Note  Patient: Ameliana Brashear Balboa  Procedure(s) Performed: Procedure(s) (LRB): TRANSANAL HEMORRHOIDAL DEARTERIALIZATION (N/A)     Anesthesia Type: General Level of consciousness: awake and sedated Pain management: pain level controlled Vital Signs Assessment: post-procedure vital signs reviewed and stable Respiratory status: spontaneous breathing Cardiovascular status: stable Postop Assessment: no signs of nausea or vomiting Anesthetic complications: no    Last Vitals:  Vitals:   03/31/17 0839 03/31/17 0845  BP: (!) 145/98 (!) 147/69  Pulse: 62 (!) 56  Resp: 17 12  Temp: 36.5 C     Last Pain:  Vitals:   03/31/17 0839  TempSrc:   PainSc: 0-No pain   Pain Goal: Patients Stated Pain Goal: 3 (03/31/17 0523)               Zana Biancardi JR,JOHN Mateo Flow

## 2017-03-31 NOTE — Op Note (Signed)
03/31/2017  8:34 AM  PATIENT:  Susan [name]  76 y.o. female  Patient Care Team: Unk Pinto, MD as PCP - General (Internal Medicine) Wyatt Portela, MD (Hematology and Oncology) Inda Castle, MD as Consulting Physician (Gastroenterology) Malena Catholic, MD as Referring Physician Monna Fam, MD as Consulting Physician (Ophthalmology)  PRE-OPERATIVE DIAGNOSIS:  bleeding, prolapsed internal hemorrhoids  POST-OPERATIVE DIAGNOSIS:  bleeding, prolapsed internal hemorrhoids  PROCEDURE:  Procedure(s): TRANSANAL HEMORRHOIDAL DEARTERIALIZATION  SURGEON:  Surgeon(s): Leighton Ruff, MD  ASSISTANT: none   ANESTHESIA:   local and MAC  EBL:  Total I/O In: 700 [I.V.:700] Out: 20 [Blood:20]  DRAINS: none   SPECIMEN:  No Specimen  DISPOSITION OF SPECIMEN:  N/A  COUNTS:  YES  PLAN OF CARE: Discharge to home after PACU  PATIENT DISPOSITION:  PACU - hemodynamically stable.  INDICATION: 76 y.o. F with prolapsing hemorrhoids and anemia   OR FINDINGS: grade 3 internal hemorrhoids  DESCRIPTION: the patient was identified in the preoperative holding area and taken to the OR where they were laid supine on the operating room table.  MAC anesthesia was induced without difficulty. SCDs were also noted to be in place prior to the initiation of anesthesia.  The patient was then placed in lithotomy postion and prepped and draped in the usual sterile fashion.   A surgical timeout was performed indicating the correct patient, procedure, positioning and need for preoperative antibiotics.    Informed consent was confirmed. Patient underwent general anesthesia without difficulty. Patient was placed into lithotomy positioning.  The perianal region was prepped and draped in sterile fashion. Surgical time out confirmed or plan.  I did digital rectal examination and then transitioned over to anoscopy to get a sense of the anatomy.  I switched over to the [facility] fiberoptically lit Doppler  anocope.   Using the Doppler on the tip of the [place] anoscope, I identified the arterial hemorrhoidal vessels coming in in the classic hexagonal anatomical pattern  (right posterior/lateral/anterior, left posterior /lateral/anterior).    I proceeded to ligate the hemorrhoidal arteries. I used a 2-0 Vicryl suture on a UR-6 needle in a figure-of-eight fashion over the signal around 6 cm proximal to the anal verge. I then ran that stitch longitudinally more distally to the white line of [place]. I then tied that stitch down to cause a hemorrhoidopexy. I did that for all 6 locations.    I redid Doppler anoscopy. I Identified a signal at the right posterior location.  I isolated and ligated this with a figure-of-eight stitch. Signals went away.  At completion of this, all hemorrhoids were reduced into the rectum.  There is no more prolapse. External anatomy looked normal.  I repeated anoscopy and examination.   Hemostasis was good.  Patient is being extubated go to recovery room.  I am about to discuss the patient's status to the family.

## 2017-03-31 NOTE — Discharge Instructions (Addendum)
General Anesthesia, Adult, Care After These instructions provide you with information about caring for yourself after your procedure. Your health care provider may also give you more specific instructions. Your treatment has been planned according to current medical practices, but problems sometimes occur. Call your health care provider if you have any problems or questions after your procedure. What can I expect after the procedure? After the procedure, it is common to have:  Vomiting.  A sore throat.  Mental slowness.  It is common to feel:  Nauseous.  Cold or shivery.  Sleepy.  Tired.  Sore or achy, even in parts of your body where you did not have surgery.  Follow these instructions at home: For at least 24 hours after the procedure:  Do not: ? Participate in activities where you could fall or become injured. ? Drive. ? Use heavy machinery. ? Drink alcohol. ? Take sleeping pills or medicines that cause drowsiness. ? Make important decisions or sign legal documents. ? Take care of children on your own.  Rest. Eating and drinking  If you vomit, drink water, juice, or soup when you can drink without vomiting.  Drink enough fluid to keep your urine clear or pale yellow.  Make sure you have little or no nausea before eating solid foods.  Follow the diet recommended by your health care provider. General instructions  Have a responsible adult stay with you until you are awake and alert.  Return to your normal activities as told by your health care provider. Ask your health care provider what activities are safe for you.  Take over-the-counter and prescription medicines only as told by your health care provider.  If you smoke, do not smoke without supervision.  Keep all follow-up visits as told by your health care provider. This is important. Contact a health care provider if:  You continue to have nausea or vomiting at home, and medicines are not helpful.  You  cannot drink fluids or start eating again.  You cannot urinate after 8-12 hours.  You develop a skin rash.  You have fever.  You have increasing redness at the site of your procedure. Get help right away if:  You have difficulty breathing.  You have chest pain.  You have unexpected bleeding.  You feel that you are having a life-threatening or urgent problem. This information is not intended to replace advice given to you by your health care provider. Make sure you discuss any questions you have with your health care provider. Document Released: 12/26/2000 Document Revised: 02/22/2016 Document Reviewed: 09/03/2015 Elsevier Interactive Patient Education  2018 [place]: POST OP INSTRUCTIONS 1. Take your usually prescribed home medications unless otherwise directed. 2. DIET: During the first few hours after surgery sip on some liquids until you are able to urinate.  It is normal to not urinate for several hours after this surgery.  If you feel uncomfortable, please contact the office for instructions.  After you are able to urinate,you may eat, if you feel like it.  Follow a light bland diet the first 24 hours after arrival home, such as soup, liquids, crackers, etc.  Be sure to include lots of fluids daily (6-8 glasses).  Avoid fast food or heavy meals, as your are more likely to get nauseated.  Eat a low fat diet the next few days after surgery.  Limit caffeine intake to 1-2 servings a day. 3. PAIN CONTROL: a. Pain is best controlled by a usual combination of several different methods  TOGETHER: i. Muscle relaxation: Soak in a warm bath (or Sitz bath) three times a day and after bowel movements.  Continue to do this until all pain is resolved. ii. Over the counter pain medication iii. Prescription pain medication b. Most patients will experience some swelling and discomfort in the anus/rectal area and incisions.  Heat such as warm towels, sitz baths, warm baths, etc  to help relax tight/sore spots and speed recovery.  Some people prefer to use ice, especially in the first couple days after surgery, as it may decrease the pain and swelling, or alternate between ice & heat.  Experiment to what works for you.  Swelling and bruising can take several weeks to resolve.  Pain can take even longer to completely resolve. c. It is helpful to take an over-the-counter pain medication regularly for the first few weeks.  Choose one of the following that works best for you: i. Naproxen (Aleve, etc)  Two 220mg  tabs twice a day ii. Ibuprofen (Advil, etc) Three 200mg  tabs four times a day (every meal & bedtime) d. A  prescription for pain medication (such as percocet, oxycodone, hydrocodone, etc) should be given to you upon discharge.  Take your pain medication as prescribed.  i. If you are having problems/concerns with the prescription medicine (does not control pain, nausea, vomiting, rash, itching, etc), please call us [phone] to see if we need to switch you to a different pain medicine that will work better for you and/or control your side effect better. ii. If you need a refill on your pain medication, please contact your pharmacy.  They will contact our office to request authorization. Prescriptions will not be filled after 5 pm or on week-ends. 4. KEEP YOUR BOWELS REGULAR and AVOID CONSTIPATION a. The goal is one to two soft bowel movements a day.  You should at least have a bowel movement every other day. b. Avoid getting constipated.  Between the surgery and the pain medications, it is common to experience some constipation. This can be very painful after rectal surgery.  Increasing fluid intake and taking a fiber supplement (such as Metamucil, Citrucel, FiberCon, etc) 1-2 times a day regularly will usually help prevent this problem from occurring.  A stool softener like colace is also recommended.  This can be purchased over the counter at your pharmacy.  You can take it  up to 3 times a day.  If you do not have a bowel movement after 24 hrs since your surgery, take one does of milk of magnesia.  If you still haven't had a bowel movement 8-12 hours after that dose, take another dose.  If you don't have a bowel movement 48 hrs after surgery, purchase a Fleets enema from the drug store and administer gently per package instructions.  If you still are having trouble with your bowel movements after that, please call the office for further instructions. c. If you develop diarrhea or have many loose bowel movements, simplify your diet to bland foods & liquids for a few days.  Stop any stool softeners and decrease your fiber supplement.  Switching to mild anti-diarrheal medications (Kayopectate, Pepto Bismol) can help.  If this worsens or does not improve, please call us.  5. Wound Care a. Remove your bandages before your first bowel movement or 8 hours after surgery.     b. Remove any wound packing material at this tim,e as well.  You do not need to repack the wound unless instructed otherwise.  Wear  an absorbent pad or soft cotton gauze in your underwear to catch any drainage and help keep the area clean. You should change this every 2-3 hours while awake. c. Keep the area clean and dry.  Bathe / shower every day, especially after bowel movements.  Keep the area clean by showering / bathing over the incision / wound.   It is okay to soak an open wound to help wash it.  Wet wipes or showers / gentle washing after bowel movements is often less traumatic than regular toilet paper. d. Dennis Bast may have some styrofoam-like soft packing in the rectum which will come out with the first bowel movement.  e. You will often notice bleeding with bowel movements.  This should slow down by the end of the first week of surgery f. Expect some drainage.  This should slow down, too, by the end of the first week of surgery.  Wear an absorbent pad or soft cotton gauze in your underwear until the drainage  stops. g. Do Not sit on a rubber or pillow ring.  This can make you symptoms worse.  You may sit on a soft pillow if needed.  6. ACTIVITIES as tolerated:   a. You may resume regular (light) daily activities beginning the next day--such as daily self-care, walking, climbing stairs--gradually increasing activities as tolerated.  If you can walk 30 minutes without difficulty, it is safe to try more intense activity such as jogging, treadmill, bicycling, low-impact aerobics, swimming, etc. b. Save the most intensive and strenuous activity for last such as sit-ups, heavy lifting, contact sports, etc  Refrain from any heavy lifting or straining until you are off narcotics for pain control.   c. You may drive when you are no longer taking prescription pain medication, you can comfortably sit for long periods of time, and you can safely maneuver your car and apply brakes. d. Dennis Bast may have sexual intercourse when it is comfortable.  7. FOLLOW UP in our office a. Please call [facility] at [phone] to set up an appointment to see your surgeon in the office for a follow-up appointment approximately 3-4 weeks after your surgery. b. Make sure that you call for this appointment the day you arrive home to insure a convenient appointment time. 10. IF YOU HAVE DISABILITY OR FAMILY LEAVE FORMS, BRING THEM TO THE OFFICE FOR PROCESSING.  DO NOT GIVE THEM TO YOUR DOCTOR.     WHEN TO CALL us [phone]: 1. Poor pain control 2. Reactions / problems with new medications (rash/itching, nausea, etc)  3. Fever over 101.5 F (38.5 C) 4. Inability to urinate 5. Nausea and/or vomiting 6. Worsening swelling or bruising 7. Continued bleeding from incision. 8. Increased pain, redness, or drainage from the incision  The clinic staff is available to answer your questions during regular business hours (8:30am-5pm).  Please dont hesitate to call and ask to speak to one of our nurses for clinical concerns.   A surgeon from  [facility] is always on call at the hospitals   If you have a medical emergency, go to the nearest emergency room or call 911.    [facility], [place], [place], [place], [place]  [address] ? MAIN: [phone]) [phone] ? TOLL FREE: [phone] ? FAX [phone] [email or web address]

## 2017-03-31 NOTE — H&P (Signed)
The patient is a 77 year old female who presents with a complaint of Rectal bleeding. 76 year old female who presents with rectal bleeding and prolapse over the past several months. She notices prolapse and bleeding on a daily basis with bowel movements. She is being treated for anemia. Colonoscopy shows no other signs for bleeding. She has a long-standing history of constipation. She also notices mucus drainage. She is status post hemorrhoid banding approximately 3 years ago. She has never had any hemorrhoid surgeries. Her only abdominal surgery has been hernia repairs.   Past Surgical History Mammie Lorenzo, LPN; 8/91/6945 03:88 AM) Laparoscopic Inguinal Hernia Surgery  Left. Shoulder Surgery  Bilateral. Ventral / Umbilical Hernia Surgery  Left.  Diagnostic Studies History Mammie Lorenzo, LPN; 05/30/33 91:79 AM) Colonoscopy  within last year Mammogram  1-3 years ago Pap Smear  1-5 years ago  Allergies Mammie Lorenzo, LPN; 1/50/5697 94:80 AM) Bactrim *ANTI-INFECTIVE AGENTS - MISC.*  Biaxin *MACROLIDES*  Flagyl *ANTI-INFECTIVE AGENTS - MISC.*  Penicillins  Sulfa Antibiotics  Allergies Reconciled   Medication History Mammie Lorenzo, LPN; 1/65/5374 82:70 AM) AmLODIPine Besylate (10MG  Tablet, Oral) Active. Diclofenac Sodium (75MG  Tablet DR, Oral) Active. Labetalol HCl (300MG  Tablet, Oral) Active. Levothyroxine Sodium (100MCG Tablet, Oral) Active. Losartan Potassium-HCTZ (100-25MG  Tablet, Oral) Active. Pantoprazole Sodium (40MG  Tablet DR, Oral) Active. Aranesp (Alb Free) SureClick (786LJQ/4.9EE Solution, Injection) Active. Aspirin (81MG  Tablet, Oral) Active. Lomotil (2.5-0.025MG  Tablet, Oral) Active. Atrovent (0.03% Solution, Nasal) Active. Vitamin D3 (5000UNIT Tablet Chewable, Oral) Active. Medications Reconciled  Social History Mammie Lorenzo, LPN; 1/00/7121 97:58 AM) Alcohol use  Moderate alcohol use. Caffeine use  Coffee. Tobacco use  Former  smoker.  Family History Mammie Lorenzo, LPN; 8/32/5498 26:41 AM) Arthritis  Sister. Hypertension  Sister.  Pregnancy / Birth History Mammie Lorenzo, LPN; 5/83/0940 76:80 AM) Age at menarche  28 years. Age of menopause  >80 Gravida  1 Maternal age  78-25 Para  50  Other Problems Mammie Lorenzo, LPN; 8/81/1031 59:45 AM) Arthritis  Gastric Ulcer  Gastroesophageal Reflux Disease  Hemorrhoids  High blood pressure  Thyroid Disease  Umbilical Hernia Repair     Review of Systems Claiborne Billings Dockery LPN; 8/59/2924 46:28 AM) General Not Present- Appetite Loss, Chills, Fatigue, Fever, Night Sweats, Weight Gain and Weight Loss. Skin Not Present- Change in Wart/Mole, Dryness, Hives, Jaundice, New Lesions, Non-Healing Wounds, Rash and Ulcer. HEENT Present- Seasonal Allergies and Sinus Pain. Not Present- Earache, Hearing Loss, Hoarseness, Nose Bleed, Oral Ulcers, Ringing in the Ears, Sore Throat, Visual Disturbances, Wears glasses/contact lenses and Yellow Eyes. Respiratory Not Present- Bloody sputum, Chronic Cough, Difficulty Breathing, Snoring and Wheezing. Breast Not Present- Breast Mass, Breast Pain, Nipple Discharge and Skin Changes. Cardiovascular Not Present- Chest Pain, Difficulty Breathing Lying Down, Leg Cramps, Palpitations, Rapid Heart Rate, Shortness of Breath and Swelling of Extremities. Gastrointestinal Present- Bloody Stool, Hemorrhoids and Rectal Pain. Not Present- Abdominal Pain, Bloating, Change in Bowel Habits, Chronic diarrhea, Constipation, Difficulty Swallowing, Excessive gas, Gets full quickly at meals, Indigestion, Nausea and Vomiting. Female Genitourinary Present- Frequency. Not Present- Nocturia, Painful Urination, Pelvic Pain and Urgency. Musculoskeletal Present- Back Pain and Joint Pain. Not Present- Joint Stiffness, Muscle Pain, Muscle Weakness and Swelling of Extremities. Neurological Not Present- Decreased Memory, Fainting, Headaches, Numbness, Seizures,  Tingling, Tremor, Trouble walking and Weakness. Psychiatric Not Present- Anxiety, Bipolar, Change in Sleep Pattern, Depression, Fearful and Frequent crying. Endocrine Present- Hot flashes. Not Present- Cold Intolerance, Excessive Hunger, Hair Changes, Heat Intolerance and New Diabetes.  Vitals Claiborne Billings Dockery LPN; 6/38/1771 16:57 AM) 03/24/2017  11:37 AM Weight: 171.8 lb Height: 62in Body Surface Area: 1.79 m Body Mass Index: 31.42 kg/m  Temp.: 97.28F(Oral)  Pulse: 78 (Regular)  BP: 124/82 (Sitting, Left Arm, Standard)       Physical Exam Leighton Ruff MD; 5/61/5379 11:14 AM) General Mental Status-Alert. General Appearance-Not in acute distress. Build & Nutrition-Well nourished. Posture-Normal posture. Gait-Normal.  Head and Neck Head-normocephalic, atraumatic with no lesions or palpable masses. Trachea-midline.  Chest and Lung Exam Chest and lung exam reveals -on auscultation, normal breath sounds, no adventitious sounds and normal vocal resonance.  Cardiovascular Cardiovascular examination reveals -normal heart sounds, regular rate and rhythm with no murmurs and no digital clubbing, cyanosis, edema, increased warmth or tenderness.  Abdomen Inspection Inspection of the abdomen reveals - No Hernias. Palpation/Percussion Palpation and Percussion of the abdomen reveal - Soft, Non Tender, No Rigidity (guarding), No hepatosplenomegaly and No Palpable abdominal masses.  Rectal Anorectal Exam External - skin tag. Internal - normal internal exam. Note: Small thrombosis palpated internally Decreased rectal tone noted.  Neurologic Neurologic evaluation reveals -alert and oriented x 3 with no impairment of recent or remote memory, normal attention span and ability to concentrate, normal sensation and normal coordination.  Musculoskeletal Normal Exam - Bilateral-Upper Extremity Strength Normal and Lower Extremity Strength Normal.   Results  Leighton Ruff MD; 4/32/7614 11:17 AM) Procedures  Name Value Date ANOSCOPY, DIAGNOSTIC (70929) [ Hemorrhoids ] Procedure Other: Procedure: Anoscopy Surgeon: Marcello Moores After the risks and benefits were explained, verbal consent was obtained for above procedure. A medical assistant chaperone was present thoroughout the entire procedure. Anesthesia: none Diagnosis: Rectal bleeding, prolapse Findings: Upon straining she prolapses a left lateral and right anterior internal hemorrhoid. There is a small amount of right posterior mucosal prolapse. I do not see any full-thickness rectal prolapse. Anoscopy confirms what appears to be grade 3 large right anterior left lateral internal hemorrhoids. Grade 2 right posterior internal hemorrhoid.  Performed: 03/24/2017 11:15 AM    Assessment & Plan Leighton Ruff MD; 5/74/7340 11:15 AM) PROLAPSED INTERNAL HEMORRHOIDS, GRADE 3 (Z70.9) Impression: 76 year old female who presents to the office for rectal prolapse. On exam today she definitely has right anterior and left lateral grade 3 internal hemorrhoid prolapse with probably some associated mucosal prolapse. I do not see any full-thickness rectal prolapse upon straining. Her anoscopy confirms grade 3 right anterior and left lateral internal hemorrhoids. I have recommended transfer hemorrhoidal dearterialization as her main symptom is bleeding. I think this will also control her prolapse nicely. We have discussed this in detail. All questions were answered. Current Plans

## 2017-04-01 ENCOUNTER — Encounter (HOSPITAL_COMMUNITY): Payer: Self-pay | Admitting: General Surgery

## 2017-04-07 ENCOUNTER — Other Ambulatory Visit (HOSPITAL_BASED_OUTPATIENT_CLINIC_OR_DEPARTMENT_OTHER): Payer: Medicare Other

## 2017-04-07 ENCOUNTER — Ambulatory Visit (HOSPITAL_BASED_OUTPATIENT_CLINIC_OR_DEPARTMENT_OTHER): Payer: Medicare Other

## 2017-04-07 VITALS — BP 121/60 | HR 68 | Temp 98.0°F | Resp 18

## 2017-04-07 DIAGNOSIS — N183 Chronic kidney disease, stage 3 unspecified: Secondary | ICD-10-CM

## 2017-04-07 DIAGNOSIS — D631 Anemia in chronic kidney disease: Secondary | ICD-10-CM

## 2017-04-07 DIAGNOSIS — N189 Chronic kidney disease, unspecified: Secondary | ICD-10-CM

## 2017-04-07 DIAGNOSIS — D649 Anemia, unspecified: Secondary | ICD-10-CM

## 2017-04-07 LAB — CBC WITH DIFFERENTIAL/PLATELET
BASO%: 0.2 % (ref 0.0–2.0)
Basophils Absolute: 0 10*3/uL (ref 0.0–0.1)
EOS ABS: 0.1 10*3/uL (ref 0.0–0.5)
EOS%: 1.7 % (ref 0.0–7.0)
HCT: 28.8 % — ABNORMAL LOW (ref 34.8–46.6)
HEMOGLOBIN: 9.5 g/dL — AB (ref 11.6–15.9)
LYMPH%: 26 % (ref 14.0–49.7)
MCH: 28.1 pg (ref 25.1–34.0)
MCHC: 33 g/dL (ref 31.5–36.0)
MCV: 85.2 fL (ref 79.5–101.0)
MONO#: 0.7 10*3/uL (ref 0.1–0.9)
MONO%: 11.9 % (ref 0.0–14.0)
NEUT%: 60.2 % (ref 38.4–76.8)
NEUTROS ABS: 3.5 10*3/uL (ref 1.5–6.5)
PLATELETS: 136 10*3/uL — AB (ref 145–400)
RBC: 3.38 10*6/uL — ABNORMAL LOW (ref 3.70–5.45)
RDW: 17.1 % — AB (ref 11.2–14.5)
WBC: 5.8 10*3/uL (ref 3.9–10.3)
lymph#: 1.5 10*3/uL (ref 0.9–3.3)

## 2017-04-07 MED ORDER — DARBEPOETIN ALFA 300 MCG/0.6ML IJ SOSY
300.0000 ug | PREFILLED_SYRINGE | INTRAMUSCULAR | Status: DC
  Administered 2017-04-07: 300 ug via SUBCUTANEOUS
  Filled 2017-04-07: qty 0.6

## 2017-04-07 NOTE — Patient Instructions (Signed)
Darbepoetin Alfa injection What is this medicine? DARBEPOETIN ALFA (dar be POE e tin AL fa) helps your body make more red blood cells. It is used to treat anemia caused by chronic kidney failure and chemotherapy. This medicine may be used for other purposes; ask your health care provider or pharmacist if you have questions. COMMON BRAND NAME(S): Aranesp What should I tell my health care provider before I take this medicine? They need to know if you have any of these conditions: -blood clotting disorders or history of blood clots -cancer patient not on chemotherapy -cystic fibrosis -heart disease, such as angina, heart failure, or a history of a heart attack -hemoglobin level of 12 g/dL or greater -high blood pressure -low levels of folate, iron, or vitamin B12 -seizures -an unusual or allergic reaction to darbepoetin, erythropoietin, albumin, hamster proteins, latex, other medicines, foods, dyes, or preservatives -pregnant or trying to get pregnant -breast-feeding How should I use this medicine? This medicine is for injection into a vein or under the skin. It is usually given by a health care professional in a hospital or clinic setting. If you get this medicine at home, you will be taught how to prepare and give this medicine. Do not shake the solution before you withdraw a dose. Use exactly as directed. Take your medicine at regular intervals. Do not take your medicine more often than directed. It is important that you put your used needles and syringes in a special sharps container. Do not put them in a trash can. If you do not have a sharps container, call your pharmacist or healthcare provider to get one. Talk to your pediatrician regarding the use of this medicine in children. While this medicine may be used in children as young as 1 year for selected conditions, precautions do apply. Overdosage: If you think you have taken too much of this medicine contact a poison control center or  emergency room at once. NOTE: This medicine is only for you. Do not share this medicine with others. What if I miss a dose? If you miss a dose, take it as soon as you can. If it is almost time for your next dose, take only that dose. Do not take double or extra doses. What may interact with this medicine? Do not take this medicine with any of the following medications: -epoetin alfa This list may not describe all possible interactions. Give your health care provider a list of all the medicines, herbs, non-prescription drugs, or dietary supplements you use. Also tell them if you smoke, drink alcohol, or use illegal drugs. Some items may interact with your medicine. What should I watch for while using this medicine? Visit your prescriber or health care professional for regular checks on your progress and for the needed blood tests and blood pressure measurements. It is especially important for the doctor to make sure your hemoglobin level is in the desired range, to limit the risk of potential side effects and to give you the best benefit. Keep all appointments for any recommended tests. Check your blood pressure as directed. Ask your doctor what your blood pressure should be and when you should contact him or her. As your body makes more red blood cells, you may need to take iron, folic acid, or vitamin B supplements. Ask your doctor or health care provider which products are right for you. If you have kidney disease continue dietary restrictions, even though this medication can make you feel better. Talk with your doctor or health   care professional about the foods you eat and the vitamins that you take. What side effects may I notice from receiving this medicine? Side effects that you should report to your doctor or health care professional as soon as possible: -allergic reactions like skin rash, itching or hives, swelling of the face, lips, or tongue -breathing problems -changes in vision -chest  pain -confusion, trouble speaking or understanding -feeling faint or lightheaded, falls -high blood pressure -muscle aches or pains -pain, swelling, warmth in the leg -rapid weight gain -severe headaches -sudden numbness or weakness of the face, arm or leg -trouble walking, dizziness, loss of balance or coordination -seizures (convulsions) -swelling of the ankles, feet, hands -unusually weak or tired Side effects that usually do not require medical attention (report to your doctor or health care professional if they continue or are bothersome): -diarrhea -fever, chills (flu-like symptoms) -headaches -nausea, vomiting -redness, stinging, or swelling at site where injected This list may not describe all possible side effects. Call your doctor for medical advice about side effects. You may report side effects to FDA at 1-800-FDA-1088. Where should I keep my medicine? Keep out of the reach of children. Store in a refrigerator between 2 and 8 degrees C (36 and 46 degrees F). Do not freeze. Do not shake. Throw away any unused portion if using a single-dose vial. Throw away any unused medicine after the expiration date. NOTE: This sheet is a summary. It may not cover all possible information. If you have questions about this medicine, talk to your doctor, pharmacist, or health care provider.  2015, [employer]. (2008-09-02 10:23:57)  

## 2017-04-28 ENCOUNTER — Ambulatory Visit (HOSPITAL_BASED_OUTPATIENT_CLINIC_OR_DEPARTMENT_OTHER): Payer: Medicare Other

## 2017-04-28 ENCOUNTER — Other Ambulatory Visit (HOSPITAL_BASED_OUTPATIENT_CLINIC_OR_DEPARTMENT_OTHER): Payer: Medicare Other

## 2017-04-28 VITALS — BP 140/77 | HR 66 | Temp 97.1°F | Resp 20

## 2017-04-28 DIAGNOSIS — N189 Chronic kidney disease, unspecified: Secondary | ICD-10-CM

## 2017-04-28 DIAGNOSIS — D631 Anemia in chronic kidney disease: Secondary | ICD-10-CM

## 2017-04-28 DIAGNOSIS — D649 Anemia, unspecified: Secondary | ICD-10-CM

## 2017-04-28 DIAGNOSIS — N183 Chronic kidney disease, stage 3 unspecified: Secondary | ICD-10-CM

## 2017-04-28 LAB — CBC WITH DIFFERENTIAL/PLATELET
BASO%: 0 % (ref 0.0–2.0)
BASOS ABS: 0 10*3/uL (ref 0.0–0.1)
EOS%: 3.5 % (ref 0.0–7.0)
Eosinophils Absolute: 0.1 10*3/uL (ref 0.0–0.5)
HEMATOCRIT: 32.2 % — AB (ref 34.8–46.6)
HGB: 10.7 g/dL — ABNORMAL LOW (ref 11.6–15.9)
LYMPH#: 1.9 10*3/uL (ref 0.9–3.3)
LYMPH%: 55.1 % — ABNORMAL HIGH (ref 14.0–49.7)
MCH: 28.8 pg (ref 25.1–34.0)
MCHC: 33.2 g/dL (ref 31.5–36.0)
MCV: 86.6 fL (ref 79.5–101.0)
MONO#: 0.3 10*3/uL (ref 0.1–0.9)
MONO%: 9.3 % (ref 0.0–14.0)
NEUT#: 1.1 10*3/uL — ABNORMAL LOW (ref 1.5–6.5)
NEUT%: 32.1 % — AB (ref 38.4–76.8)
PLATELETS: 165 10*3/uL (ref 145–400)
RBC: 3.72 10*6/uL (ref 3.70–5.45)
RDW: 16.6 % — ABNORMAL HIGH (ref 11.2–14.5)
WBC: 3.4 10*3/uL — ABNORMAL LOW (ref 3.9–10.3)

## 2017-04-28 MED ORDER — DARBEPOETIN ALFA 300 MCG/0.6ML IJ SOSY
300.0000 ug | PREFILLED_SYRINGE | INTRAMUSCULAR | Status: DC
  Administered 2017-04-28: 300 ug via SUBCUTANEOUS
  Filled 2017-04-28: qty 0.6

## 2017-04-28 NOTE — Patient Instructions (Signed)
Darbepoetin Alfa injection What is this medicine? DARBEPOETIN ALFA (dar be POE e tin AL fa) helps your body make more red blood cells. It is used to treat anemia caused by chronic kidney failure and chemotherapy. This medicine may be used for other purposes; ask your health care provider or pharmacist if you have questions. COMMON BRAND NAME(S): Aranesp What should I tell my health care provider before I take this medicine? They need to know if you have any of these conditions: -blood clotting disorders or history of blood clots -cancer patient not on chemotherapy -cystic fibrosis -heart disease, such as angina, heart failure, or a history of a heart attack -hemoglobin level of 12 g/dL or greater -high blood pressure -low levels of folate, iron, or vitamin B12 -seizures -an unusual or allergic reaction to darbepoetin, erythropoietin, albumin, hamster proteins, latex, other medicines, foods, dyes, or preservatives -pregnant or trying to get pregnant -breast-feeding How should I use this medicine? This medicine is for injection into a vein or under the skin. It is usually given by a health care professional in a hospital or clinic setting. If you get this medicine at home, you will be taught how to prepare and give this medicine. Do not shake the solution before you withdraw a dose. Use exactly as directed. Take your medicine at regular intervals. Do not take your medicine more often than directed. It is important that you put your used needles and syringes in a special sharps container. Do not put them in a trash can. If you do not have a sharps container, call your pharmacist or healthcare provider to get one. Talk to your pediatrician regarding the use of this medicine in children. While this medicine may be used in children as young as 1 year for selected conditions, precautions do apply. Overdosage: If you think you have taken too much of this medicine contact a poison control center or  emergency room at once. NOTE: This medicine is only for you. Do not share this medicine with others. What if I miss a dose? If you miss a dose, take it as soon as you can. If it is almost time for your next dose, take only that dose. Do not take double or extra doses. What may interact with this medicine? Do not take this medicine with any of the following medications: -epoetin alfa This list may not describe all possible interactions. Give your health care provider a list of all the medicines, herbs, non-prescription drugs, or dietary supplements you use. Also tell them if you smoke, drink alcohol, or use illegal drugs. Some items may interact with your medicine. What should I watch for while using this medicine? Visit your prescriber or health care professional for regular checks on your progress and for the needed blood tests and blood pressure measurements. It is especially important for the doctor to make sure your hemoglobin level is in the desired range, to limit the risk of potential side effects and to give you the best benefit. Keep all appointments for any recommended tests. Check your blood pressure as directed. Ask your doctor what your blood pressure should be and when you should contact him or her. As your body makes more red blood cells, you may need to take iron, folic acid, or vitamin B supplements. Ask your doctor or health care provider which products are right for you. If you have kidney disease continue dietary restrictions, even though this medication can make you feel better. Talk with your doctor or health   care professional about the foods you eat and the vitamins that you take. What side effects may I notice from receiving this medicine? Side effects that you should report to your doctor or health care professional as soon as possible: -allergic reactions like skin rash, itching or hives, swelling of the face, lips, or tongue -breathing problems -changes in vision -chest  pain -confusion, trouble speaking or understanding -feeling faint or lightheaded, falls -high blood pressure -muscle aches or pains -pain, swelling, warmth in the leg -rapid weight gain -severe headaches -sudden numbness or weakness of the face, arm or leg -trouble walking, dizziness, loss of balance or coordination -seizures (convulsions) -swelling of the ankles, feet, hands -unusually weak or tired Side effects that usually do not require medical attention (report to your doctor or health care professional if they continue or are bothersome): -diarrhea -fever, chills (flu-like symptoms) -headaches -nausea, vomiting -redness, stinging, or swelling at site where injected This list may not describe all possible side effects. Call your doctor for medical advice about side effects. You may report side effects to FDA at 1-800-FDA-1088. Where should I keep my medicine? Keep out of the reach of children. Store in a refrigerator between 2 and 8 degrees C (36 and 46 degrees F). Do not freeze. Do not shake. Throw away any unused portion if using a single-dose vial. Throw away any unused medicine after the expiration date. NOTE: This sheet is a summary. It may not cover all possible information. If you have questions about this medicine, talk to your doctor, pharmacist, or health care provider.  2015, [employer]. (2008-09-02 10:23:57)  

## 2017-05-01 ENCOUNTER — Other Ambulatory Visit: Payer: Self-pay | Admitting: Internal Medicine

## 2017-05-08 ENCOUNTER — Encounter: Payer: Self-pay | Admitting: *Deleted

## 2017-05-08 ENCOUNTER — Encounter: Payer: Self-pay | Admitting: Internal Medicine

## 2017-05-19 ENCOUNTER — Ambulatory Visit: Payer: Medicare Other

## 2017-05-19 ENCOUNTER — Other Ambulatory Visit (HOSPITAL_BASED_OUTPATIENT_CLINIC_OR_DEPARTMENT_OTHER): Payer: Medicare Other

## 2017-05-19 DIAGNOSIS — D631 Anemia in chronic kidney disease: Secondary | ICD-10-CM | POA: Diagnosis not present

## 2017-05-19 DIAGNOSIS — N183 Chronic kidney disease, stage 3 unspecified: Secondary | ICD-10-CM

## 2017-05-19 DIAGNOSIS — N189 Chronic kidney disease, unspecified: Secondary | ICD-10-CM

## 2017-05-19 LAB — CBC WITH DIFFERENTIAL/PLATELET
BASO%: 0.7 % (ref 0.0–2.0)
BASOS ABS: 0 10*3/uL (ref 0.0–0.1)
EOS%: 2.9 % (ref 0.0–7.0)
Eosinophils Absolute: 0.1 10*3/uL (ref 0.0–0.5)
HEMATOCRIT: 35.4 % (ref 34.8–46.6)
HEMOGLOBIN: 11.8 g/dL (ref 11.6–15.9)
LYMPH#: 1.9 10*3/uL (ref 0.9–3.3)
LYMPH%: 49.9 % — ABNORMAL HIGH (ref 14.0–49.7)
MCH: 28.5 pg (ref 25.1–34.0)
MCHC: 33.4 g/dL (ref 31.5–36.0)
MCV: 85.3 fL (ref 79.5–101.0)
MONO#: 0.3 10*3/uL (ref 0.1–0.9)
MONO%: 8.5 % (ref 0.0–14.0)
NEUT%: 38 % — ABNORMAL LOW (ref 38.4–76.8)
NEUTROS ABS: 1.4 10*3/uL — AB (ref 1.5–6.5)
Platelets: 124 10*3/uL — ABNORMAL LOW (ref 145–400)
RBC: 4.15 10*6/uL (ref 3.70–5.45)
RDW: 16.9 % — AB (ref 11.2–14.5)
WBC: 3.8 10*3/uL — AB (ref 3.9–10.3)

## 2017-05-19 LAB — TECHNOLOGIST REVIEW

## 2017-05-19 NOTE — Progress Notes (Signed)
Patient reported to flush room for Aranesp injection. Labs reviewed Hgb 11.8 patient advised injection is not indicated at this time. Copy of lab provided. Patient discharged from flush room.

## 2017-06-01 ENCOUNTER — Other Ambulatory Visit: Payer: Self-pay | Admitting: Internal Medicine

## 2017-06-01 DIAGNOSIS — I10 Essential (primary) hypertension: Secondary | ICD-10-CM

## 2017-06-09 ENCOUNTER — Ambulatory Visit (HOSPITAL_BASED_OUTPATIENT_CLINIC_OR_DEPARTMENT_OTHER): Payer: Medicare Other

## 2017-06-09 ENCOUNTER — Other Ambulatory Visit (HOSPITAL_BASED_OUTPATIENT_CLINIC_OR_DEPARTMENT_OTHER): Payer: Medicare Other

## 2017-06-09 VITALS — BP 149/82 | HR 68 | Temp 97.8°F | Resp 18

## 2017-06-09 DIAGNOSIS — D631 Anemia in chronic kidney disease: Secondary | ICD-10-CM | POA: Diagnosis not present

## 2017-06-09 DIAGNOSIS — N189 Chronic kidney disease, unspecified: Secondary | ICD-10-CM

## 2017-06-09 DIAGNOSIS — N183 Chronic kidney disease, stage 3 unspecified: Secondary | ICD-10-CM

## 2017-06-09 DIAGNOSIS — D649 Anemia, unspecified: Secondary | ICD-10-CM

## 2017-06-09 LAB — CBC WITH DIFFERENTIAL/PLATELET
BASO%: 0.2 % (ref 0.0–2.0)
Basophils Absolute: 0 10*3/uL (ref 0.0–0.1)
EOS%: 2.6 % (ref 0.0–7.0)
Eosinophils Absolute: 0.1 10*3/uL (ref 0.0–0.5)
HEMATOCRIT: 32.6 % — AB (ref 34.8–46.6)
HEMOGLOBIN: 10.7 g/dL — AB (ref 11.6–15.9)
LYMPH#: 2.1 10*3/uL (ref 0.9–3.3)
LYMPH%: 50 % — ABNORMAL HIGH (ref 14.0–49.7)
MCH: 27.3 pg (ref 25.1–34.0)
MCHC: 32.8 g/dL (ref 31.5–36.0)
MCV: 83.2 fL (ref 79.5–101.0)
MONO#: 0.3 10*3/uL (ref 0.1–0.9)
MONO%: 7.2 % (ref 0.0–14.0)
NEUT%: 40 % (ref 38.4–76.8)
NEUTROS ABS: 1.7 10*3/uL (ref 1.5–6.5)
Platelets: 165 10*3/uL (ref 145–400)
RBC: 3.92 10*6/uL (ref 3.70–5.45)
RDW: 15.3 % — AB (ref 11.2–14.5)
WBC: 4.3 10*3/uL (ref 3.9–10.3)

## 2017-06-09 MED ORDER — DARBEPOETIN ALFA 300 MCG/0.6ML IJ SOSY
300.0000 ug | PREFILLED_SYRINGE | INTRAMUSCULAR | Status: DC
  Administered 2017-06-09: 300 ug via SUBCUTANEOUS
  Filled 2017-06-09: qty 0.6

## 2017-06-09 NOTE — Patient Instructions (Signed)
Darbepoetin Alfa injection What is this medicine? DARBEPOETIN ALFA (dar be POE e tin AL fa) helps your body make more red blood cells. It is used to treat anemia caused by chronic kidney failure and chemotherapy. This medicine may be used for other purposes; ask your health care provider or pharmacist if you have questions. COMMON BRAND NAME(S): Aranesp What should I tell my health care provider before I take this medicine? They need to know if you have any of these conditions: -blood clotting disorders or history of blood clots -cancer patient not on chemotherapy -cystic fibrosis -heart disease, such as angina, heart failure, or a history of a heart attack -hemoglobin level of 12 g/dL or greater -high blood pressure -low levels of folate, iron, or vitamin B12 -seizures -an unusual or allergic reaction to darbepoetin, erythropoietin, albumin, hamster proteins, latex, other medicines, foods, dyes, or preservatives -pregnant or trying to get pregnant -breast-feeding How should I use this medicine? This medicine is for injection into a vein or under the skin. It is usually given by a health care professional in a hospital or clinic setting. If you get this medicine at home, you will be taught how to prepare and give this medicine. Do not shake the solution before you withdraw a dose. Use exactly as directed. Take your medicine at regular intervals. Do not take your medicine more often than directed. It is important that you put your used needles and syringes in a special sharps container. Do not put them in a trash can. If you do not have a sharps container, call your pharmacist or healthcare provider to get one. Talk to your pediatrician regarding the use of this medicine in children. While this medicine may be used in children as young as 1 year for selected conditions, precautions do apply. Overdosage: If you think you have taken too much of this medicine contact a poison control center or  emergency room at once. NOTE: This medicine is only for you. Do not share this medicine with others. What if I miss a dose? If you miss a dose, take it as soon as you can. If it is almost time for your next dose, take only that dose. Do not take double or extra doses. What may interact with this medicine? Do not take this medicine with any of the following medications: -epoetin alfa This list may not describe all possible interactions. Give your health care provider a list of all the medicines, herbs, non-prescription drugs, or dietary supplements you use. Also tell them if you smoke, drink alcohol, or use illegal drugs. Some items may interact with your medicine. What should I watch for while using this medicine? Visit your prescriber or health care professional for regular checks on your progress and for the needed blood tests and blood pressure measurements. It is especially important for the doctor to make sure your hemoglobin level is in the desired range, to limit the risk of potential side effects and to give you the best benefit. Keep all appointments for any recommended tests. Check your blood pressure as directed. Ask your doctor what your blood pressure should be and when you should contact him or her. As your body makes more red blood cells, you may need to take iron, folic acid, or vitamin B supplements. Ask your doctor or health care provider which products are right for you. If you have kidney disease continue dietary restrictions, even though this medication can make you feel better. Talk with your doctor or health   care professional about the foods you eat and the vitamins that you take. What side effects may I notice from receiving this medicine? Side effects that you should report to your doctor or health care professional as soon as possible: -allergic reactions like skin rash, itching or hives, swelling of the face, lips, or tongue -breathing problems -changes in vision -chest  pain -confusion, trouble speaking or understanding -feeling faint or lightheaded, falls -high blood pressure -muscle aches or pains -pain, swelling, warmth in the leg -rapid weight gain -severe headaches -sudden numbness or weakness of the face, arm or leg -trouble walking, dizziness, loss of balance or coordination -seizures (convulsions) -swelling of the ankles, feet, hands -unusually weak or tired Side effects that usually do not require medical attention (report to your doctor or health care professional if they continue or are bothersome): -diarrhea -fever, chills (flu-like symptoms) -headaches -nausea, vomiting -redness, stinging, or swelling at site where injected This list may not describe all possible side effects. Call your doctor for medical advice about side effects. You may report side effects to FDA at 1-800-FDA-1088. Where should I keep my medicine? Keep out of the reach of children. Store in a refrigerator between 2 and 8 degrees C (36 and 46 degrees F). Do not freeze. Do not shake. Throw away any unused portion if using a single-dose vial. Throw away any unused medicine after the expiration date. NOTE: This sheet is a summary. It may not cover all possible information. If you have questions about this medicine, talk to your doctor, pharmacist, or health care provider.  2015, [employer]. (2008-09-02 10:23:57)  

## 2017-06-22 ENCOUNTER — Ambulatory Visit: Payer: Self-pay | Admitting: Internal Medicine

## 2017-06-30 ENCOUNTER — Ambulatory Visit (HOSPITAL_BASED_OUTPATIENT_CLINIC_OR_DEPARTMENT_OTHER): Payer: Medicare Other

## 2017-06-30 ENCOUNTER — Other Ambulatory Visit (HOSPITAL_BASED_OUTPATIENT_CLINIC_OR_DEPARTMENT_OTHER): Payer: Medicare Other

## 2017-06-30 ENCOUNTER — Ambulatory Visit (HOSPITAL_BASED_OUTPATIENT_CLINIC_OR_DEPARTMENT_OTHER): Payer: Medicare Other | Admitting: Oncology

## 2017-06-30 ENCOUNTER — Telehealth: Payer: Self-pay | Admitting: Oncology

## 2017-06-30 VITALS — BP 162/77 | HR 68

## 2017-06-30 VITALS — BP 196/87 | HR 65 | Temp 98.1°F | Resp 18 | Ht 62.5 in | Wt 170.9 lb

## 2017-06-30 DIAGNOSIS — D649 Anemia, unspecified: Secondary | ICD-10-CM

## 2017-06-30 DIAGNOSIS — D631 Anemia in chronic kidney disease: Secondary | ICD-10-CM | POA: Diagnosis not present

## 2017-06-30 DIAGNOSIS — N189 Chronic kidney disease, unspecified: Secondary | ICD-10-CM

## 2017-06-30 DIAGNOSIS — N183 Chronic kidney disease, stage 3 unspecified: Secondary | ICD-10-CM

## 2017-06-30 LAB — CBC WITH DIFFERENTIAL/PLATELET
BASO%: 0.3 % (ref 0.0–2.0)
Basophils Absolute: 0 10*3/uL (ref 0.0–0.1)
EOS%: 2.2 % (ref 0.0–7.0)
Eosinophils Absolute: 0.1 10*3/uL (ref 0.0–0.5)
HCT: 32.3 % — ABNORMAL LOW (ref 34.8–46.6)
HGB: 10.6 g/dL — ABNORMAL LOW (ref 11.6–15.9)
LYMPH%: 51.9 % — ABNORMAL HIGH (ref 14.0–49.7)
MCH: 27.7 pg (ref 25.1–34.0)
MCHC: 32.8 g/dL (ref 31.5–36.0)
MCV: 84.3 fL (ref 79.5–101.0)
MONO#: 0.4 10*3/uL (ref 0.1–0.9)
MONO%: 11.3 % (ref 0.0–14.0)
NEUT%: 34.3 % — ABNORMAL LOW (ref 38.4–76.8)
NEUTROS ABS: 1.2 10*3/uL — AB (ref 1.5–6.5)
NRBC: 0 % (ref 0–0)
Platelets: 102 10*3/uL — ABNORMAL LOW (ref 145–400)
RBC: 3.83 10*6/uL (ref 3.70–5.45)
RDW: 16.7 % — AB (ref 11.2–14.5)
WBC: 3.6 10*3/uL — AB (ref 3.9–10.3)
lymph#: 1.9 10*3/uL (ref 0.9–3.3)

## 2017-06-30 LAB — COMPREHENSIVE METABOLIC PANEL
ALBUMIN: 4.1 g/dL (ref 3.5–5.0)
ALK PHOS: 101 U/L (ref 40–150)
AST: 17 U/L (ref 5–34)
Anion Gap: 7 mEq/L (ref 3–11)
BUN: 26.5 mg/dL — ABNORMAL HIGH (ref 7.0–26.0)
CO2: 24 meq/L (ref 22–29)
Calcium: 10.1 mg/dL (ref 8.4–10.4)
Chloride: 111 mEq/L — ABNORMAL HIGH (ref 98–109)
Creatinine: 1.4 mg/dL — ABNORMAL HIGH (ref 0.6–1.1)
EGFR: 40 mL/min/{1.73_m2} — AB (ref >90)
GLUCOSE: 89 mg/dL (ref 70–140)
POTASSIUM: 4.4 meq/L (ref 3.5–5.1)
SODIUM: 143 meq/L (ref 136–145)
Total Bilirubin: 0.76 mg/dL (ref 0.20–1.20)
Total Protein: 7.6 g/dL (ref 6.4–8.3)

## 2017-06-30 LAB — IRON AND TIBC
%SAT: 30 % (ref 21–57)
Iron: 75 ug/dL (ref 41–142)
TIBC: 250 ug/dL (ref 236–444)
UIBC: 175 ug/dL (ref 120–384)

## 2017-06-30 LAB — FERRITIN: Ferritin: 48 ng/ml (ref 9–269)

## 2017-06-30 MED ORDER — DARBEPOETIN ALFA 300 MCG/0.6ML IJ SOSY
300.0000 ug | PREFILLED_SYRINGE | INTRAMUSCULAR | Status: DC
  Administered 2017-06-30: 300 ug via SUBCUTANEOUS
  Filled 2017-06-30: qty 0.6

## 2017-06-30 NOTE — Progress Notes (Signed)
Hematology and Oncology Follow Up Visit  Susan [name] [id number] March 24, 1941 76 y.o. 06/30/2017 9:22 AM Susan [name], MDMcKeown, Susan Saxon, MD   Principle Diagnosis:  76 year old woman with anemia of renal disease diagnosed in 2010. She also has element of early myelodysplasia.  Secondary Diagnosis: Hypertension.  Current therapy: She is on Aranesp 300 mcg every 3 weeks to keep her hemoglobin above 11.  Interim History:  Susan [name] presents today for a follow-up visit. Since her last visit, she reports feeling well without any major concerns. She continues to tolerate Aranesp without any major complications. Her appetite and performance status is not dramatically changed. She feels better with a hemoglobin close to 11 and she is able to perform all activities of daily living. She denied any hematochezia or melena at this time. She denied any epistaxis or any blood losses.  She does not report any headaches, blurry vision, syncope or seizures. She denies bleeding, bruising or lymphadenopathy. He has not reported any fevers or chills or sweats. She has not reported any chest pain or palpitation. Does not report any shortness of breath or wheezing. She does not report any abdominal pain or early satiety.  Rest of her review of systems unremarkable.  Medications: I have reviewed the patient's current medications.  Current Outpatient Prescriptions  Medication Sig Dispense Refill  . acetaminophen (TYLENOL) 500 MG tablet Take 500-1,000 mg by mouth every 6 (six) hours as needed (for pain.).    [name] amLODipine (NORVASC) 10 MG tablet TAKE 1/2 TO 1 TABLET BY MOUTH EVERY DAY FOR BLOOD PRESSURE (Patient taking differently: TAKE 1/2 TABLET BY MOUTH DAILY AT BEDTIME; (TAKE 1 TABLET IF SBP IS >150)) 90 tablet 0  . aspirin 81 MG chewable tablet Chew 81 mg by mouth daily.     . Cholecalciferol (VITAMIN D-3) 5000 UNITS TABS Take 15,000 Units by mouth daily.     . Darbepoetin Alfa (ARANESP, ALBUMIN FREE,) 300  MCG/ML SOLN Inject 300 mcg into the skin See admin instructions. USE INJECTION EVERY 3 WEEKS AS NEEDED FOR HEMOGLOBIN LESS THAN 11.    . diclofenac (VOLTAREN) 75 MG EC tablet TAKE 1 TABLET(75 MG) BY MOUTH TWICE DAILY 180 tablet 1  . diphenhydrAMINE (BENADRYL) 25 mg capsule One capsule every 6 hours x 3 days, then as needed for symptoms of rash/itching 30 capsule 0  . famotidine (PEPCID) 20 MG tablet Take 1 tablet (20 mg total) by mouth 2 (two) times daily. X 3 days, then as needed for itching/rash 30 tablet 0  . hydroxypropyl methylcellulose / hypromellose (ISOPTO TEARS / GONIOVISC) 2.5 % ophthalmic solution Place 1-2 drops into both eyes 3 (three) times daily as needed for dry eyes.    [name] labetalol (NORMODYNE) 300 MG tablet TAKE 1 TABLET BY MOUTH TWICE DAILY 180 tablet 1  . levothyroxine (SYNTHROID, LEVOTHROID) 100 MCG tablet TAKE 1 TABLET BY MOUTH DAILY ON AN EMPTY STOMACH FOR 30 MINUTES 90 tablet 0  . losartan-hydrochlorothiazide (HYZAAR) 100-25 MG tablet TAKE 1 TABLET BY MOUTH DAILY 90 tablet 0  . Magnesium 500 MG TABS Take 500 mg by mouth at bedtime as needed (leg cramps).    . Menthol-Methyl Salicylate (EQL COOL HEAT EXTRA STRENGTH EX) Apply 1 application topically 3 (three) times daily as needed (for pain.).    [name] pantoprazole (PROTONIX) 40 MG tablet TAKE 1 TABLET BY MOUTH EVERY DAY FOR ACID REFLUX 90 tablet 0  . HYDROcodone-acetaminophen (NORCO/VICODIN) 5-325 MG tablet Take 1-2 tablets by mouth every 4 (four) hours as needed. (Patient  not taking: Reported on 06/30/2017) 30 tablet 0   No current facility-administered medications for this visit.      Past Medical History, Surgical history, Social history, and Family History were reviewed and updated.  [name]  Physical Exam: Blood pressure (!) 196/87, pulse 65, temperature 98.1 F (36.7 C), temperature source Oral, resp. rate 18, height 5' 2.5" (1.588 m), weight 170 lb 14.4 oz (77.5 kg), SpO2 100 %. ECOG: 1 General appearance:  Well appearing  woman without distress. Head: Normocephalic, without obvious abnormality no oral thrush or ulcers. Neck: no adenopathy or masses. Lymph nodes: Cervical, supraclavicular, and axillary nodes normal. Heart:regular rate and rhythm, S1, S2 normal, no murmur, click, rub or gallop Lung:chest clear, no wheezing, rales, normal symmetric air entry Abdomen: soft, non-tender, without masses or organomegaly no shifting dullness or ascites. Extremities: No edema.  Lab Results: Lab Results  Component Value Date   WBC 4.3 06/09/2017   HGB 10.7 (L) 06/09/2017   HCT 32.6 (L) 06/09/2017   MCV 83.2 06/09/2017   PLT 165 06/09/2017     Chemistry      Component Value Date/Time   NA 138 03/28/2017 0946   K 4.2 03/28/2017 0946   CL 104 03/28/2017 0946   CO2 24 03/28/2017 0946   BUN 33 (H) 03/28/2017 0946   CREATININE 1.61 (H) 03/28/2017 0946   CREATININE 1.57 (H) 01/31/2017 1058      Component Value Date/Time   CALCIUM 9.8 03/28/2017 0946   ALKPHOS 87 01/31/2017 1058   AST 17 01/31/2017 1058   ALT 8 01/31/2017 1058   BILITOT 0.7 01/31/2017 1058      Impression and Plan:   76 year old female with the following issues:  1. 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 She is currently receiving Aranesp every 3 weeks to keep her hemoglobin above 11. Her Hemoglobin is pending today and she will receive Aranesp if her hemoglobin is less than 11. The plan is to continue with routine CBC every 3 weeks and repeat iron studies in January 2018.  2. Rectal bleeding and incontinence: Appears to have resolved at this time.  3. History of questionable myelodysplasia: No evidence of cytopenias and noted other than her anemia. We will continue to monitor her regarding this issue. Repeat bone marrow biopsy may be needed in the future. 4. Follow up. Every 3 weeks for CBC and injection. Clinical visit in 4 months.       Zola Button MD 9/28/20189:22 AM

## 2017-06-30 NOTE — Progress Notes (Signed)
Pt bp rechecked 162/77 in L arm. Called and talked to Dr. Alen Blew desk nurse Nyoka Cowden). Gave ok to give injection due to decrease in bp. Porsche Cates LPN

## 2017-06-30 NOTE — Telephone Encounter (Signed)
Scheduled appt per 9/28 los - Gave patient AVS and calender per los.

## 2017-07-20 ENCOUNTER — Ambulatory Visit (INDEPENDENT_AMBULATORY_CARE_PROVIDER_SITE_OTHER): Payer: Medicare Other | Admitting: Adult Health

## 2017-07-20 ENCOUNTER — Encounter: Payer: Self-pay | Admitting: Adult Health

## 2017-07-20 VITALS — BP 148/84 | HR 68 | Temp 97.4°F | Resp 18 | Ht 62.25 in | Wt 166.8 lb

## 2017-07-20 DIAGNOSIS — I10 Essential (primary) hypertension: Secondary | ICD-10-CM | POA: Diagnosis not present

## 2017-07-20 DIAGNOSIS — E039 Hypothyroidism, unspecified: Secondary | ICD-10-CM

## 2017-07-20 DIAGNOSIS — R300 Dysuria: Secondary | ICD-10-CM

## 2017-07-20 DIAGNOSIS — K219 Gastro-esophageal reflux disease without esophagitis: Secondary | ICD-10-CM

## 2017-07-20 DIAGNOSIS — M1990 Unspecified osteoarthritis, unspecified site: Secondary | ICD-10-CM | POA: Diagnosis not present

## 2017-07-20 MED ORDER — LEVOTHYROXINE SODIUM 100 MCG PO TABS: ORAL_TABLET | ORAL | 0 refills | Status: DC

## 2017-07-20 MED ORDER — PANTOPRAZOLE SODIUM 40 MG PO TBEC: DELAYED_RELEASE_TABLET | ORAL | 0 refills | Status: DC

## 2017-07-20 MED ORDER — DICLOFENAC SODIUM 75 MG PO TBEC: DELAYED_RELEASE_TABLET | ORAL | 1 refills | Status: DC

## 2017-07-20 MED ORDER — AMLODIPINE BESYLATE 10 MG PO TABS: ORAL_TABLET | ORAL | 0 refills | Status: DC

## 2017-07-20 MED ORDER — CIPROFLOXACIN HCL 250 MG PO TABS: 250.0000 mg | ORAL_TABLET | Freq: Two times a day (BID) | ORAL | 0 refills | Status: DC

## 2017-07-20 MED ORDER — LOSARTAN POTASSIUM-HCTZ 100-25 MG PO TABS: 1.0000 | ORAL_TABLET | Freq: Every day | ORAL | 0 refills | Status: DC

## 2017-07-20 NOTE — Progress Notes (Signed)
Assessment and Plan:  Susan [name] was seen today for urinary tract infection.  Diagnoses and all orders for this visit:  Dysuria -     Urinalysis w microscopic + reflex cultur -     ciprofloxacin (CIPRO) 250 MG tablet; Take 1 tablet (250 mg total) by mouth 2 (two) times daily.  Essential hypertension -     amLODipine (NORVASC) 10 MG tablet; TAKE 1/2 TABLET BY MOUTH DAILY AT BEDTIME; (TAKE 1 TABLET IF SBP IS >150) -     losartan-hydrochlorothiazide (HYZAAR) 100-25 MG tablet; Take 1 tablet by mouth daily. -     Check BP daily, will follow up in 1 week.   Further disposition pending results of labs. Discussed med's effects and SE's.   Over 15 minutes of exam, counseling, chart review, and critical decision making was performed.   Future Appointments Date Time Provider [place]  07/21/2017 8:00 AM [facility] 4 [facility] None  07/21/2017 8:45 AM [facility] FLUSH NURSE [facility] None  07/28/2017 10:30 AM [name], NP [facility] None  08/11/2017 8:00 AM [facility] LAB 2 [facility] None  08/11/2017 8:45 AM [facility] FLUSH NURSE [facility] None  09/01/2017 8:00 AM [facility] LAB 1 [facility] None  09/01/2017 8:45 AM [facility] FLUSH NURSE 2 [facility] None  09/22/2017 8:00 AM [facility] LAB 2 [facility] None  09/22/2017 8:30 AM [facility] INJ NURSE [facility] None  10/13/2017 8:00 AM [facility] LAB 1 [facility] None  10/13/2017 8:45 AM [facility] FLUSH NURSE 2 [facility] None  11/01/2017 11:00 AM Unk Pinto, MD [facility] None  11/03/2017 8:00 AM [facility] LAB 1 [facility] None  11/03/2017 8:30 AM Wyatt Portela, MD [facility] None  11/03/2017 9:00 AM [facility] INJ NURSE [facility] None  11/24/2017 8:00 AM [facility] LAB 4 [facility] None  11/24/2017 8:45 AM [facility] INJ NURSE [facility] None    ------------------------------------------------------------------------------------------------------------------   HPI BP (!) 148/84   Pulse  68   Temp (!) 97.4 F (36.3 C)   Resp 18   Ht 5' 2.25" (1.581 m)   Wt 166 lb 12.8 oz (75.7 kg)   BMI 30.26 kg/m   76 y.o.female presents for c/o dysuria, urgency and frequency ongoing for 2 weeks. She reports her urine has looked dark for the past few days, but denies cloudy appearance or foul odor. She denies vaginal discharge, perineal itching/discomfort, N/V/D, abdominal pain.  Has been pushing fluids.   The patient is nearly out of several medications and reports her BPs have been running 170s/100s. BP in office today 148/84. Will refill BP medications and bring back the patient in 1 week for evaluation of BP and other medication management concerns. She denies HA/dizziness, vision changes, CP/SOB, claudication or edema.   Lab Results  Component Value Date   GFRAA 34 (L) 03/28/2017    Past Medical History:  Diagnosis Date  . Anemia   . Arthritis   . Blood transfusion without reported diagnosis   . GERD (gastroesophageal reflux disease)   . HLD (hyperlipidemia)   . Hypertension   . Hypothyroidism   . Myelodysplasia   . Vitamin D deficiency      Allergies  Allergen Reactions  . Codeine Anaphylaxis  . Penicillins Anaphylaxis    Has patient had a PCN reaction causing immediate rash, facial/tongue/throat swelling, SOB or lightheadedness with hypotension: Yes Has patient had a PCN reaction causing severe rash involving mucus membranes or skin necrosis: No Has patient had a PCN reaction that required hospitalization: Yes Has patient had a PCN reaction occurring within the last 10 years: No  If all of the above answers are "NO", then may proceed with Cephalosporin use.  . Bactrim Itching and Swelling  . Clarithromycin Other (See Comments)    "Caused skin to peel off my hand."  . Flagyl [Metronidazole Hcl] Itching and Swelling  . Food Other (See Comments)    EATS WHITE RICE ONLY (ALLERGIC REACTION TO YELLOW OR BROWN RICE THAT RESULTED IN HOSPITALIZATION)  . Pineapple Itching  and Swelling    SWELLING OF EYES  . Sulfa Antibiotics Itching and Swelling    Current Outpatient Prescriptions on File Prior to Visit  Medication Sig  . acetaminophen (TYLENOL) 500 MG tablet Take 500-1,000 mg by mouth every 6 (six) hours as needed (for pain.).  [name] aspirin 81 MG chewable tablet Chew 81 mg by mouth daily.   . Cholecalciferol (VITAMIN D-3) 5000 UNITS TABS Take 15,000 Units by mouth daily.   . Darbepoetin Alfa (ARANESP, ALBUMIN FREE,) 300 MCG/ML SOLN Inject 300 mcg into the skin See admin instructions. USE INJECTION EVERY 3 WEEKS AS NEEDED FOR HEMOGLOBIN LESS THAN 11.  . diphenhydrAMINE (BENADRYL) 25 mg capsule One capsule every 6 hours x 3 days, then as needed for symptoms of rash/itching  . famotidine (PEPCID) 20 MG tablet Take 1 tablet (20 mg total) by mouth 2 (two) times daily. X 3 days, then as needed for itching/rash  . HYDROcodone-acetaminophen (NORCO/VICODIN) 5-325 MG tablet Take 1-2 tablets by mouth every 4 (four) hours as needed.  . hydroxypropyl methylcellulose / hypromellose (ISOPTO TEARS / GONIOVISC) 2.5 % ophthalmic solution Place 1-2 drops into both eyes 3 (three) times daily as needed for dry eyes.  [name] labetalol (NORMODYNE) 300 MG tablet TAKE 1 TABLET BY MOUTH TWICE DAILY  . Magnesium 500 MG TABS Take 500 mg by mouth at bedtime as needed (leg cramps).  . Menthol-Methyl Salicylate (EQL COOL HEAT EXTRA STRENGTH EX) Apply 1 application topically 3 (three) times daily as needed (for pain.).   No current facility-administered medications on file prior to visit.     ROS: all negative except above.   Physical Exam:  BP (!) 148/84   Pulse 68   Temp (!) 97.4 F (36.3 C)   Resp 18   Ht 5' 2.25" (1.581 m)   Wt 166 lb 12.8 oz (75.7 kg)   BMI 30.26 kg/m   General Appearance: Well nourished, in no apparent distress. Neck: Supple.  Respiratory: Respiratory effort normal, BS equal bilaterally without rales, rhonchi, wheezing or stridor.  Cardio: RRR with no MRGs.  Brisk peripheral pulses without edema.  Abdomen: Soft, + BS.  Non tender, no guarding, rebound, hernias, masses. Lymphatics: Non tender without lymphadenopathy.  Musculoskeletal: Full ROM, 5/5 strength, normal gait.  Skin: Warm, dry without rashes, lesions, ecchymosis.  Neuro: Cranial nerves intact. Normal muscle tone, no cerebellar symptoms. Sensation intact.  Psych: Awake and oriented X 3, normal affect, Insight and Judgment appropriate.     Izora Ribas, NP 12:44 PM [facility]

## 2017-07-20 NOTE — Patient Instructions (Signed)

## 2017-07-21 ENCOUNTER — Other Ambulatory Visit (HOSPITAL_BASED_OUTPATIENT_CLINIC_OR_DEPARTMENT_OTHER): Payer: Medicare Other

## 2017-07-21 ENCOUNTER — Ambulatory Visit: Payer: Medicare Other

## 2017-07-21 DIAGNOSIS — N183 Chronic kidney disease, stage 3 unspecified: Secondary | ICD-10-CM

## 2017-07-21 DIAGNOSIS — D631 Anemia in chronic kidney disease: Secondary | ICD-10-CM | POA: Diagnosis not present

## 2017-07-21 DIAGNOSIS — N189 Chronic kidney disease, unspecified: Secondary | ICD-10-CM

## 2017-07-21 LAB — CBC WITH DIFFERENTIAL/PLATELET
BASO%: 0 % (ref 0.0–2.0)
BASOS ABS: 0 10*3/uL (ref 0.0–0.1)
EOS ABS: 0.1 10*3/uL (ref 0.0–0.5)
EOS%: 2.3 % (ref 0.0–7.0)
HCT: 34.6 % — ABNORMAL LOW (ref 34.8–46.6)
HEMOGLOBIN: 11.5 g/dL — AB (ref 11.6–15.9)
LYMPH%: 53.5 % — ABNORMAL HIGH (ref 14.0–49.7)
MCH: 28.1 pg (ref 25.1–34.0)
MCHC: 33.2 g/dL (ref 31.5–36.0)
MCV: 84.6 fL (ref 79.5–101.0)
MONO#: 0.4 10*3/uL (ref 0.1–0.9)
MONO%: 11.3 % (ref 0.0–14.0)
NEUT#: 1.2 10*3/uL — ABNORMAL LOW (ref 1.5–6.5)
NEUT%: 32.9 % — ABNORMAL LOW (ref 38.4–76.8)
NRBC: 0 % (ref 0–0)
PLATELETS: 132 10*3/uL — AB (ref 145–400)
RBC: 4.09 10*6/uL (ref 3.70–5.45)
RDW: 16 % — AB (ref 11.2–14.5)
WBC: 3.6 10*3/uL — ABNORMAL LOW (ref 3.9–10.3)
lymph#: 1.9 10*3/uL (ref 0.9–3.3)

## 2017-07-21 NOTE — Progress Notes (Signed)
Pt did not meet parameters for Aranesp shot. Hgb 11.5. Pt was given lab results and reviewed. Advised pt that injection was not needed. Pt understood and was discharged. Porsche Cates LPN

## 2017-07-23 LAB — URINE CULTURE
MICRO NUMBER: 81171029
SPECIMEN QUALITY:: ADEQUATE

## 2017-07-23 LAB — URINALYSIS W MICROSCOPIC + REFLEX CULTURE
BILIRUBIN URINE: NEGATIVE
GLUCOSE, UA: NEGATIVE
HGB URINE DIPSTICK: NEGATIVE
Hyaline Cast: NONE SEEN /LPF
Ketones, ur: NEGATIVE
Nitrites, Initial: NEGATIVE
PH: 5.5 (ref 5.0–8.0)
Specific Gravity, Urine: 1.02 (ref 1.001–1.03)
WBC, UA: >=60 /HPF — AB (ref 0–5)

## 2017-07-23 LAB — CULTURE INDICATED

## 2017-07-24 ENCOUNTER — Telehealth: Payer: Self-pay

## 2017-07-24 NOTE — Telephone Encounter (Signed)
Urine did show an infection; cipro (the antibiotic we started) should be good for this infection. She should call us back if she continues to have symptoms after completing the course.

## 2017-07-24 NOTE — Telephone Encounter (Signed)
Patient requesting urine lab results. Please advise

## 2017-07-24 NOTE — Telephone Encounter (Signed)
Patient aware of results and will call if symptoms don't improve.

## 2017-07-28 ENCOUNTER — Encounter: Payer: Self-pay | Admitting: Adult Health

## 2017-07-28 ENCOUNTER — Ambulatory Visit (INDEPENDENT_AMBULATORY_CARE_PROVIDER_SITE_OTHER): Payer: Medicare Other | Admitting: Adult Health

## 2017-07-28 VITALS — BP 140/94 | HR 60 | Temp 97.3°F | Resp 18 | Ht 62.25 in | Wt 164.4 lb

## 2017-07-28 DIAGNOSIS — I10 Essential (primary) hypertension: Secondary | ICD-10-CM

## 2017-07-28 DIAGNOSIS — K219 Gastro-esophageal reflux disease without esophagitis: Secondary | ICD-10-CM

## 2017-07-28 DIAGNOSIS — N183 Chronic kidney disease, stage 3 unspecified: Secondary | ICD-10-CM

## 2017-07-28 LAB — BASIC METABOLIC PANEL WITHOUT GFR
BUN/Creatinine Ratio: 19 (calc) (ref 6–22)
BUN: 32 mg/dL — ABNORMAL HIGH (ref 7–25)
CO2: 26 mmol/L (ref 20–32)
Calcium: 9.4 mg/dL (ref 8.6–10.4)
Chloride: 104 mmol/L (ref 98–110)
Creat: 1.68 mg/dL — ABNORMAL HIGH (ref 0.60–0.93)
GFR, Est African American: 33 mL/min/{1.73_m2} — ABNORMAL LOW
GFR, Est Non African American: 29 mL/min/{1.73_m2} — ABNORMAL LOW
Glucose, Bld: 87 mg/dL (ref 65–99)
Potassium: 3.9 mmol/L (ref 3.5–5.3)
Sodium: 139 mmol/L (ref 135–146)

## 2017-07-28 MED ORDER — RANITIDINE HCL 150 MG PO TABS: 150.0000 mg | ORAL_TABLET | Freq: Two times a day (BID) | ORAL | 3 refills | Status: DC

## 2017-07-28 NOTE — Patient Instructions (Addendum)
I am writing ranitidine 150 mg twice a day to replace protonix/pantoprazole which was very expensive. You may take this medication as little as once a day, or go up to as much as two tablets twice a day as needed.   Start taking your amlodipine/norvasc 5 mg daily - depending on your kidney results we may need to stop the losartan/hydrochlorothiazide  Avoid salt in your diet  Go to the ER if any chest pain, shortness of breath, nausea, dizziness, severe HA, changes vision/speech    Ranitidine tablets or capsules What is this medicine? RANITIDINE (ra NYE te deen) is a type of antihistamine that blocks the release of stomach acid. It is used to treat stomach or intestinal ulcers. It can relieve ulcer pain and discomfort, and the heartburn from acid reflux. This medicine may be used for other purposes; ask your health care provider or pharmacist if you have questions. COMMON BRAND NAME(S): Acid Reducer, Ranitidine, Taladine, Wal-Zan, Zantac, Zantac 150, Zantac 75 What should I tell my health care provider before I take this medicine? They need to know if you have any of these conditions: -kidney disease -liver disease -porphyria -an unusual or allergic reaction to ranitidine, other medicines, foods, dyes, or preservatives -pregnant or trying to get pregnant -breast-feeding How should I use this medicine? Take this medicine by mouth with a glass of water. Follow the directions on the prescription label. If you only take this medicine once a day, take it at bedtime. Take your medicine at regular intervals. Do not take your medicine more often than directed. Do not stop taking except on your doctor's advice. Talk to your pediatrician regarding the use of this medicine in children. Special care may be needed. Overdosage: If you think you have taken too much of this medicine contact a poison control center or emergency room at once. NOTE: This medicine is only for you. Do not share this medicine  with others. What if I miss a dose? If you miss a dose, take it as soon as you can. If it is almost time for your next dose, take only that dose. Do not take double or extra doses. What may interact with this medicine? -atazanavir -delavirdine -gefitinib -glipizide -ketoconazole -midazolam -procainamide -propantheline -triazolam -warfarin This list may not describe all possible interactions. Give your health care provider a list of all the medicines, herbs, non-prescription drugs, or dietary supplements you use. Also tell them if you smoke, drink alcohol, or use illegal drugs. Some items may interact with your medicine. What should I watch for while using this medicine? Tell your doctor or health care professional if your condition does not start to get better or gets worse. You may need to take this medicine for several days as prescribed before your symptoms get better. Finish the full course of tablets prescribed, even if you feel better. Do not smoke cigarettes or drink alcohol. These increase irritation in your stomach and can lengthen the time it will take for ulcers to heal. Cigarettes and alcohol can also make acid reflux or heartburn worse. If you get black, tarry stools or vomit up what looks like coffee grounds, call your doctor or health care professional at once. You may have a bleeding ulcer. What side effects may I notice from receiving this medicine? Side effects that you should report to your doctor or health care professional as soon as possible: -agitation, nervousness, depression, hallucinations -allergic reactions like skin rash, itching or hives, swelling of the face, lips, or tongue -  breast enlargement in both males and females -breathing problems -redness, blistering, peeling or loosening of the skin, including inside the mouth -unusual bleeding or bruising -unusually weak or tired -vomiting -yellowing of the skin or eyes Side effects that usually do not require  medical attention (report to your doctor or health care professional if they continue or are bothersome): -constipation or diarrhea -dizziness -headache -nausea This list may not describe all possible side effects. Call your doctor for medical advice about side effects. You may report side effects to FDA at 1-800-FDA-1088. Where should I keep my medicine? Keep out of the reach of children. Store at room temperature between 15 and 30 degrees C (59 and 86 degrees F). Protect from light and moisture. Keep container tightly closed. Throw away any unused medicine after the expiration date. NOTE: This sheet is a summary. It may not cover all possible information. If you have questions about this medicine, talk to your doctor, pharmacist, or health care provider.  2018 [employer]vier/Gold Standard (2013-01-09 14:50:34)

## 2017-07-28 NOTE — Progress Notes (Signed)
Assessment and Plan:  Susan [name] was seen today for follow up for BP and medication management  Diagnoses and all orders for this visit:  Essential hypertension  Currently with somewhat poor control with current regimen of amlodipine 5 mg PRN, losartan-hctz 100-25 mg daily, and labetalol 300 mg BID. Pending BMP/GFR results may need to discontinue losartan/hctz and add different agent. Instructed to start taking amlodipine 5 mg daily, may need to go up to 10 mg daily.   Monitor blood pressure at home; call if consistently over 140/90  Discussed DASH diet  Advised to go to the ER if any CP, SOB, nausea, dizziness, severe HA, changes  vision/speech, left arm numbness and tingling and jaw pain.  Gastroesophageal reflux disease, esophagitis presence not specified Initiated medication for new reflux related symptoms:  Discussed diet, avoiding triggers and other lifestyle changes -     ranitidine (ZANTAC) 150 MG tablet; Take 1 tablet (150 mg total) by mouth 2 (two) times daily.       Call in a few weeks if inadequate control despite titration up to 300 mg BID; will add PPI back on.   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 Discussed med's effects and SE's.   Over 15 minutes of exam, counseling, chart review, and critical decision making was performed.   Future Appointments Date Time Provider [place]  08/11/2017 8:00 AM [facility] 2 [facility] None  08/11/2017 8:45 AM [facility] FLUSH NURSE [facility] None  09/01/2017 8:00 AM [facility] LAB 1 [facility] None  09/01/2017 8:45 AM [facility] FLUSH NURSE 2 [facility] None  09/22/2017 8:00 AM [facility] LAB 2 [facility] None  09/22/2017 8:30 AM [facility] INJ NURSE [facility] None  10/13/2017 8:00 AM [facility] LAB 1 [facility] None  10/13/2017 8:45 AM [facility] FLUSH NURSE 2 [facility] None  11/01/2017 11:00 AM  Unk Pinto, MD [facility] None  11/03/2017 8:00 AM [facility] 1 [facility] None  11/03/2017 8:30 AM Wyatt Portela, MD [facility] None  11/03/2017 9:00 AM [facility] INJ NURSE [facility] None  11/24/2017 8:00 AM [facility] LAB 4 [facility] None  11/24/2017 8:45 AM [facility] INJ NURSE [facility] None    ------------------------------------------------------------------------------------------------------------------   HPI BP (!) 140/94   Pulse 60   Temp (!) 97.3 F (36.3 C)   Resp 18   Ht 5' 2.25" (1.581 m)   Wt 164 lb 6.4 oz (74.6 kg)   SpO2 97%   BMI 29.83 kg/m   76 y.o.female presents for follow up for BP - at the last visit 1 week ago she had been nearly out of several medications and reports her BPs have been running 170s/100s. BP in office that day was 148/84. We refilled BP medications and bring back the patient in 1 week for evaluation of BP and other medication management concerns.  She is currently taking amlodipine 5 mg PRN, losartan-hctz 100-25 mg daily, and labetalol 300 mg BID.   Her blood pressure has not been controlled at home, today their BP is BP: (!) 140/94  She denies HA/dizziness, vision changes, CP/SOB, claudication or edema.   She reports her GERD medication has become very expensive and she did not pick up the refill and has been without medication for two weeks. She is having frequent symptoms during the day and at night. She reports she has been on prilosec and nexium in the past, both are now expensive for her, and she reports  breakthrough symptoms with once daily dosing. As she has been off of the PPI for two weeks at this point, discussed starting ranitidine BID as a trial.   Patient was apparently unaware of her diagnosis of CKD now in stage 3B; will follow up on this today and pending results may discontinue  Lab Results  Component Value Date   GFRAA 34 (L) 03/28/2017     Past Medical History:  Diagnosis Date  . Anemia   . Arthritis    . Blood transfusion without reported diagnosis   . GERD (gastroesophageal reflux disease)   . HLD (hyperlipidemia)   . Hypertension   . Hypothyroidism   . Myelodysplasia   . Vitamin D deficiency      Allergies  Allergen Reactions  . Codeine Anaphylaxis  . Penicillins Anaphylaxis    Has patient had a PCN reaction causing immediate rash, facial/tongue/throat swelling, SOB or lightheadedness with hypotension: Yes Has patient had a PCN reaction causing severe rash involving mucus membranes or skin necrosis: No Has patient had a PCN reaction that required hospitalization: Yes Has patient had a PCN reaction occurring within the last 10 years: No If all of the above answers are "NO", then may proceed with Cephalosporin use.  . Bactrim Itching and Swelling  . Clarithromycin Other (See Comments)    "Caused skin to peel off my hand."  . Flagyl [Metronidazole Hcl] Itching and Swelling  . Food Other (See Comments)    EATS WHITE RICE ONLY (ALLERGIC REACTION TO YELLOW OR BROWN RICE THAT RESULTED IN HOSPITALIZATION)  . Pineapple Itching and Swelling    SWELLING OF EYES  . Sulfa Antibiotics Itching and Swelling    Current Outpatient Prescriptions on File Prior to Visit  Medication Sig  . acetaminophen (TYLENOL) 500 MG tablet Take 500-1,000 mg by mouth every 6 (six) hours as needed (for pain.).  [name] amLODipine (NORVASC) 10 MG tablet TAKE 1/2 TABLET BY MOUTH DAILY AT BEDTIME; (TAKE 1 TABLET IF SBP IS >150)  . aspirin 81 MG chewable tablet Chew 81 mg by mouth daily.   . Cholecalciferol (VITAMIN D-3) 5000 UNITS TABS Take 15,000 Units by mouth daily.   . ciprofloxacin (CIPRO) 250 MG tablet Take 1 tablet (250 mg total) by mouth 2 (two) times daily.  . Darbepoetin Alfa (ARANESP, ALBUMIN FREE,) 300 MCG/ML SOLN Inject 300 mcg into the skin See admin instructions. USE INJECTION EVERY 3 WEEKS AS NEEDED FOR HEMOGLOBIN LESS THAN 11.  . diclofenac (VOLTAREN) 75 MG EC tablet TAKE 1 TABLET(75 MG) BY MOUTH TWICE  DAILY  . diphenhydrAMINE (BENADRYL) 25 mg capsule One capsule every 6 hours x 3 days, then as needed for symptoms of rash/itching  . HYDROcodone-acetaminophen (NORCO/VICODIN) 5-325 MG tablet Take 1-2 tablets by mouth every 4 (four) hours as needed.  . hydroxypropyl methylcellulose / hypromellose (ISOPTO TEARS / GONIOVISC) 2.5 % ophthalmic solution Place 1-2 drops into both eyes 3 (three) times daily as needed for dry eyes.  [name] labetalol (NORMODYNE) 300 MG tablet TAKE 1 TABLET BY MOUTH TWICE DAILY  . levothyroxine (SYNTHROID, LEVOTHROID) 100 MCG tablet TAKE 1 TABLET BY MOUTH DAILY ON AN EMPTY STOMACH FOR 30 MINUTES  . losartan-hydrochlorothiazide (HYZAAR) 100-25 MG tablet Take 1 tablet by mouth daily.  . Magnesium 500 MG TABS Take 500 mg by mouth at bedtime as needed (leg cramps).  . Menthol-Methyl Salicylate (EQL COOL HEAT EXTRA STRENGTH EX) Apply 1 application topically 3 (three) times daily as needed (for pain.).  [name] pantoprazole (PROTONIX) 40 MG tablet  TAKE 1 TABLET BY MOUTH EVERY DAY FOR ACID REFLUX   No current facility-administered medications on file prior to visit.     ROS: Review of Systems  Constitutional: Negative for diaphoresis, malaise/fatigue and weight loss.  HENT: Positive for sore throat. Negative for hearing loss and tinnitus.   Eyes: Negative for blurred vision and double vision.  Respiratory: Negative for cough, sputum production, shortness of breath and wheezing.   Cardiovascular: Negative for chest pain, palpitations, orthopnea, claudication and leg swelling.  Gastrointestinal: Positive for heartburn. Negative for abdominal pain, blood in stool, constipation, diarrhea, melena, nausea and vomiting.  Genitourinary: Negative.   Musculoskeletal: Negative for joint pain and myalgias.  Skin: Negative for itching and rash.  Neurological: Negative for dizziness, tingling, sensory change, speech change, weakness and headaches.  Endo/Heme/Allergies: Negative for environmental  allergies and polydipsia.  Psychiatric/Behavioral: Negative.   All other systems reviewed and are negative.    Physical Exam:  BP (!) 140/94   Pulse 60   Temp (!) 97.3 F (36.3 C)   Resp 18   Ht 5' 2.25" (1.581 m)   Wt 164 lb 6.4 oz (74.6 kg)   SpO2 97%   BMI 29.83 kg/m   General Appearance: Well nourished, in no apparent distress. ENT: external auditory canals clear without erythema; TMs pearly without injection or bulging. Post pharynx without injection, erythema, discharge. MMM, good dentition, no lesions.  Neck: Supple.  Respiratory: Respiratory effort normal, BS equal bilaterally without rales, rhonchi, wheezing or stridor.  Cardio: RRR with no MRGs. Brisk peripheral pulses without edema.  Abdomen: Soft, + BS.  Non tender, no guarding, rebound, hernias, masses. Lymphatics: Non tender without lymphadenopathy.  Musculoskeletal: Full ROM, 5/5 strength, normal gait.  Skin: Warm, dry without rashes, lesions, ecchymosis.  Neuro: Cranial nerves intact. Normal muscle tone, no cerebellar symptoms. Sensation intact.  Psych: Awake and oriented X 3, normal affect, Insight and Judgment appropriate.     Izora Ribas, NP 11:15 AM [facility]

## 2017-08-11 ENCOUNTER — Ambulatory Visit (HOSPITAL_BASED_OUTPATIENT_CLINIC_OR_DEPARTMENT_OTHER): Payer: Medicare Other

## 2017-08-11 ENCOUNTER — Other Ambulatory Visit (HOSPITAL_BASED_OUTPATIENT_CLINIC_OR_DEPARTMENT_OTHER): Payer: Medicare Other

## 2017-08-11 VITALS — BP 140/66 | HR 59 | Temp 97.8°F | Resp 18

## 2017-08-11 DIAGNOSIS — N183 Chronic kidney disease, stage 3 unspecified: Secondary | ICD-10-CM

## 2017-08-11 DIAGNOSIS — D631 Anemia in chronic kidney disease: Secondary | ICD-10-CM

## 2017-08-11 DIAGNOSIS — D509 Iron deficiency anemia, unspecified: Secondary | ICD-10-CM

## 2017-08-11 DIAGNOSIS — N189 Chronic kidney disease, unspecified: Secondary | ICD-10-CM

## 2017-08-11 LAB — CBC WITH DIFFERENTIAL/PLATELET
BASO%: 0.3 % (ref 0.0–2.0)
Basophils Absolute: 0 10*3/uL (ref 0.0–0.1)
EOS ABS: 0.1 10*3/uL (ref 0.0–0.5)
EOS%: 2.7 % (ref 0.0–7.0)
HEMATOCRIT: 31.5 % — AB (ref 34.8–46.6)
HGB: 10.6 g/dL — ABNORMAL LOW (ref 11.6–15.9)
LYMPH#: 1.9 10*3/uL (ref 0.9–3.3)
LYMPH%: 53 % — ABNORMAL HIGH (ref 14.0–49.7)
MCH: 27.9 pg (ref 25.1–34.0)
MCHC: 33.7 g/dL (ref 31.5–36.0)
MCV: 82.9 fL (ref 79.5–101.0)
MONO#: 0.4 10*3/uL (ref 0.1–0.9)
MONO%: 10.2 % (ref 0.0–14.0)
NEUT%: 33.8 % — ABNORMAL LOW (ref 38.4–76.8)
NEUTROS ABS: 1.2 10*3/uL — AB (ref 1.5–6.5)
NRBC: 0 % (ref 0–0)
PLATELETS: 118 10*3/uL — AB (ref 145–400)
RBC: 3.8 10*6/uL (ref 3.70–5.45)
RDW: 15 % — AB (ref 11.2–14.5)
WBC: 3.6 10*3/uL — AB (ref 3.9–10.3)

## 2017-08-11 MED ORDER — DARBEPOETIN ALFA 300 MCG/0.6ML IJ SOSY
300.0000 ug | PREFILLED_SYRINGE | INTRAMUSCULAR | Status: DC
  Administered 2017-08-11: 300 ug via SUBCUTANEOUS
  Filled 2017-08-11: qty 0.6

## 2017-09-01 ENCOUNTER — Other Ambulatory Visit (HOSPITAL_BASED_OUTPATIENT_CLINIC_OR_DEPARTMENT_OTHER): Payer: Medicare Other

## 2017-09-01 ENCOUNTER — Ambulatory Visit: Payer: Medicare Other

## 2017-09-01 ENCOUNTER — Other Ambulatory Visit: Payer: Self-pay | Admitting: Oncology

## 2017-09-01 DIAGNOSIS — D469 Myelodysplastic syndrome, unspecified: Secondary | ICD-10-CM

## 2017-09-01 LAB — CBC WITH DIFFERENTIAL/PLATELET
BASO%: 0.3 % (ref 0.0–2.0)
Basophils Absolute: 0 10*3/uL (ref 0.0–0.1)
EOS%: 2.2 % (ref 0.0–7.0)
Eosinophils Absolute: 0.1 10*3/uL (ref 0.0–0.5)
HEMATOCRIT: 33.3 % — AB (ref 34.8–46.6)
HEMOGLOBIN: 11.2 g/dL — AB (ref 11.6–15.9)
LYMPH#: 2 10*3/uL (ref 0.9–3.3)
LYMPH%: 60.9 % — AB (ref 14.0–49.7)
MCH: 28.4 pg (ref 25.1–34.0)
MCHC: 33.6 g/dL (ref 31.5–36.0)
MCV: 84.3 fL (ref 79.5–101.0)
MONO#: 0.3 10*3/uL (ref 0.1–0.9)
MONO%: 8.1 % (ref 0.0–14.0)
NEUT#: 0.9 10*3/uL — ABNORMAL LOW (ref 1.5–6.5)
NEUT%: 28.5 % — AB (ref 38.4–76.8)
PLATELETS: 103 10*3/uL — AB (ref 145–400)
RBC: 3.95 10*6/uL (ref 3.70–5.45)
RDW: 15.9 % — ABNORMAL HIGH (ref 11.2–14.5)
WBC: 3.2 10*3/uL — ABNORMAL LOW (ref 3.9–10.3)
nRBC: 0 % (ref 0–0)

## 2017-09-01 NOTE — Progress Notes (Signed)
HGB level was 11.2 reviewed labs with pt no injection neeeded at this time

## 2017-09-05 ENCOUNTER — Emergency Department (HOSPITAL_COMMUNITY): Payer: Medicare Other

## 2017-09-05 ENCOUNTER — Encounter (HOSPITAL_COMMUNITY): Payer: Self-pay | Admitting: *Deleted

## 2017-09-05 ENCOUNTER — Emergency Department (HOSPITAL_COMMUNITY)
Admission: EM | Admit: 2017-09-05 | Discharge: 2017-09-05 | Disposition: A | Discharge status: Home / Self Care | Payer: Medicare Other | Attending: Emergency Medicine | Admitting: Emergency Medicine

## 2017-09-05 ENCOUNTER — Other Ambulatory Visit: Payer: Self-pay

## 2017-09-05 DIAGNOSIS — W1800XA Striking against unspecified object with subsequent fall, initial encounter: Secondary | ICD-10-CM | POA: Insufficient documentation

## 2017-09-05 DIAGNOSIS — N183 Chronic kidney disease, stage 3 (moderate): Secondary | ICD-10-CM | POA: Insufficient documentation

## 2017-09-05 DIAGNOSIS — Z7982 Long term (current) use of aspirin: Secondary | ICD-10-CM | POA: Diagnosis not present

## 2017-09-05 DIAGNOSIS — E039 Hypothyroidism, unspecified: Secondary | ICD-10-CM | POA: Insufficient documentation

## 2017-09-05 DIAGNOSIS — S20211A Contusion of right front wall of thorax, initial encounter: Secondary | ICD-10-CM | POA: Diagnosis not present

## 2017-09-05 DIAGNOSIS — Z79899 Other long term (current) drug therapy: Secondary | ICD-10-CM | POA: Insufficient documentation

## 2017-09-05 DIAGNOSIS — S299XXA Unspecified injury of thorax, initial encounter: Secondary | ICD-10-CM | POA: Diagnosis not present

## 2017-09-05 DIAGNOSIS — R0781 Pleurodynia: Secondary | ICD-10-CM | POA: Diagnosis not present

## 2017-09-05 DIAGNOSIS — Z87891 Personal history of nicotine dependence: Secondary | ICD-10-CM | POA: Insufficient documentation

## 2017-09-05 DIAGNOSIS — Y929 Unspecified place or not applicable: Secondary | ICD-10-CM | POA: Diagnosis not present

## 2017-09-05 DIAGNOSIS — Y939 Activity, unspecified: Secondary | ICD-10-CM | POA: Diagnosis not present

## 2017-09-05 DIAGNOSIS — Y999 Unspecified external cause status: Secondary | ICD-10-CM | POA: Insufficient documentation

## 2017-09-05 DIAGNOSIS — I129 Hypertensive chronic kidney disease with stage 1 through stage 4 chronic kidney disease, or unspecified chronic kidney disease: Secondary | ICD-10-CM | POA: Diagnosis not present

## 2017-09-05 MED ORDER — HYDROCODONE-ACETAMINOPHEN 5-325 MG PO TABS: 1.0000 | ORAL_TABLET | Freq: Four times a day (QID) | ORAL | 0 refills | Status: DC | PRN

## 2017-09-05 NOTE — ED Triage Notes (Signed)
Pt reports slipping off the end of the couch yesterday morning, hit right side on object. Having right rib pain since her fall. No resp distress is noted.

## 2017-09-05 NOTE — ED Provider Notes (Signed)
[place] EMERGENCY DEPARTMENT Provider Note   CSN: [id number] Arrival date & time: 09/05/17  1410     History   Chief Complaint Chief Complaint  Patient presents with  . Fall    HPI Susan [name] is a 76 y.o. female.  The history is provided by the patient. No language interpreter was used.  Fall     Susan [name] is a 76 y.o. female who presents to the Emergency Department complaining of fall.  She presents for evaluation of injuries following a fall yesterday.  She was at home when she bumped into her couch last night and fell, striking the right side of her chest.  She did not hit her head or pass out.  She reports pain in the right side of her rib cage with moving.  No fevers, cough, shortness of breath, abdominal pain, nausea, vomiting, difficulty with walking.  She has not tried anything for her pain at home.  Symptoms are moderate and constant in nature.  Past Medical History:  Diagnosis Date  . Anemia   . Arthritis   . Blood transfusion without reported diagnosis   . GERD (gastroesophageal reflux disease)   . HLD (hyperlipidemia)   . Hypertension   . Hypothyroidism   . Myelodysplasia   . Vitamin D deficiency     Patient Active Problem List   Diagnosis Date Noted  . 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 BMI 30.0-30.9,adult 09/02/2015  . Encounter for Medicare annual wellness exam 06/02/2015  . 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[place]) 11/03/2014  . CKD, 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) 11/02/2014  . Prediabetes 01/27/2014  . Medication management 01/27/2014  . Hyperlipidemia 08/27/2013  . Essential hypertension   . Hypothyroidism   . GERD (gastroesophageal reflux disease)   . Vitamin D deficiency   . Hemorrhoids 07/15/2013    Past Surgical History:  Procedure Laterality Date  . CARPAL TUNNEL RELEASE Right   . DILATION AND CURETTAGE OF UTERUS    . INGUINAL HERNIA REPAIR    . ROTATOR CUFF REPAIR Left   . TRANSANAL HEMORRHOIDAL DEARTERIALIZATION  N/A 03/31/2017   Procedure: TRANSANAL HEMORRHOIDAL DEARTERIALIZATION;  Surgeon: Leighton Ruff, MD;  Location: [facility];  Service: General;  Laterality: N/A;  . UMBILICAL HERNIA REPAIR      OB History    No data available       Home Medications    Prior to Admission medications   Medication Sig Start Date End Date Taking? Authorizing Provider  acetaminophen (TYLENOL) 500 MG tablet Take 500-1,000 mg by mouth every 6 (six) hours as needed (for pain.).    [provider]  amLODipine (NORVASC) 10 MG tablet TAKE 1/2 TABLET BY MOUTH DAILY AT BEDTIME; (TAKE 1 TABLET IF SBP IS >150) 07/20/17   [name], NP  aspirin 81 MG chewable tablet Chew 81 mg by mouth daily.     [provider]  Cholecalciferol (VITAMIN D-3) 5000 UNITS TABS Take 15,000 Units by mouth daily.     [provider]  ciprofloxacin (CIPRO) 250 MG tablet Take 1 tablet (250 mg total) by mouth 2 (two) times daily. 07/20/17   [name], NP  Darbepoetin Alfa (ARANESP, ALBUMIN FREE,) 300 MCG/ML SOLN Inject 300 mcg into the skin See admin instructions. USE INJECTION EVERY 3 WEEKS AS NEEDED FOR HEMOGLOBIN LESS THAN 11.    [provider]  diclofenac (VOLTAREN) 75 MG EC tablet TAKE 1 TABLET(75 MG) BY MOUTH TWICE DAILY 07/20/17   [name],  NP  diphenhydrAMINE (BENADRYL) 25 mg capsule One capsule every 6 hours x 3 days, then as needed for symptoms of rash/itching 03/25/17   Charlann Lange, PA-C  HYDROcodone-acetaminophen (NORCO/VICODIN) 5-325 MG tablet Take 1 tablet by mouth every 6 (six) hours as needed. 09/05/17   Quintella Reichert, MD  hydroxypropyl methylcellulose / hypromellose (ISOPTO TEARS / GONIOVISC) 2.5 % ophthalmic solution Place 1-2 drops into both eyes 3 (three) times daily as needed for dry eyes.    [provider]  labetalol (NORMODYNE) 300 MG tablet TAKE 1 TABLET BY MOUTH TWICE DAILY 06/01/17   Unk Pinto, MD  levothyroxine (SYNTHROID, LEVOTHROID) 100 MCG tablet TAKE  1 TABLET BY MOUTH DAILY ON AN EMPTY STOMACH FOR 30 MINUTES 07/20/17   [name], NP  losartan-hydrochlorothiazide (HYZAAR) 100-25 MG tablet Take 1 tablet by mouth daily. 07/20/17   [name], NP  Magnesium 500 MG TABS Take 500 mg by mouth at bedtime as needed (leg cramps).    [provider]  Menthol-Methyl Salicylate (EQL COOL HEAT EXTRA STRENGTH EX) Apply 1 application topically 3 (three) times daily as needed (for pain.).    [provider]  pantoprazole (PROTONIX) 40 MG tablet TAKE 1 TABLET BY MOUTH EVERY DAY FOR ACID REFLUX 07/20/17   [name], NP  ranitidine (ZANTAC) 150 MG tablet Take 1 tablet (150 mg total) by mouth 2 (two) times daily. 07/28/17 07/28/18  [name], NP    Family History Family History  Problem Relation Age of Onset  . Stroke Father   . Hypertension Father   . Other Mother        died at early age  . Stroke Sister   . Coronary artery disease Brother   . Hypertension Sister   . Bladder Cancer Sister   . Colon cancer Neg Hx   . Stomach cancer Neg Hx     Social History Social History   Tobacco Use  . Smoking status: Former Smoker    Types: Cigarettes    Last attempt to quit: 10/02/1996    Years since quitting: 20.9  . Smokeless tobacco: Never Used  Substance Use Topics  . Alcohol use: Yes    Alcohol/week: 0.0 oz    Comment: glass of wine at least once a month.  . Drug use: No     Allergies   Codeine; Penicillins; Bactrim; Clarithromycin; Flagyl [metronidazole hcl]; Food; Pineapple; and Sulfa antibiotics   Review of Systems Review of Systems  All other systems reviewed and are negative.    Physical Exam Updated Vital Signs BP (!) 185/97 (BP Location: Left Arm)   Pulse (!) 56   Temp 97.6 F (36.4 C) (Oral)   Resp 16   SpO2 100%   Physical Exam  Constitutional: She is oriented to person, place, and time. She appears well-developed and well-nourished.  HENT:  Head: Normocephalic and atraumatic.    Cardiovascular: Normal rate and regular rhythm.  No murmur heard. Pulmonary/Chest: Effort normal and breath sounds normal. No respiratory distress.  No splinting with deep breaths.  There is right anterior lateral chest wall tenderness to palpation  Abdominal: Soft. There is no tenderness. There is no rebound and no guarding.  Musculoskeletal: She exhibits no edema or tenderness.  Neurological: She is alert and oriented to person, place, and time.  Skin: Skin is warm and dry.  Psychiatric: She has a normal mood and affect. Her behavior is normal.  Nursing note and vitals reviewed.    ED Treatments / Results  Labs (  all labs ordered are listed, but only abnormal results are displayed) Labs Reviewed - No data to display  EKG  EKG Interpretation None       Radiology Dg Ribs Unilateral W/chest Right  Result Date: 09/05/2017 CLINICAL DATA:  76 year [name] female who fell yesterday presenting with anterolateral right-sided rib pain. EXAM: RIGHT RIBS AND CHEST - 3+ VIEW COMPARISON:  CXR 08/17/2015 FINDINGS: No acute displaced fracture or other bone lesions are seen involving the ribs. There is no evidence of pneumothorax or pleural effusion. Subchondral degenerate cystic change of the right humeral head with periarticular ossifications may reflect bursitis or rotator cuff tendinopathy. Chronic bronchitic change of the lungs without pneumonic consolidation. Lingular scarring is re- demonstrated. Heart size and mediastinal contours are within normal limits with chronic uncoiling of the thoracic aorta and mild atherosclerosis. IMPRESSION: No acute displaced rib fracture or pneumothorax. Electronically Signed   By: Ashley Royalty M.D.   On: 09/05/2017 15:59    Procedures Procedures (including critical care time)  Medications Ordered in ED Medications - No data to display   Initial Impression / Assessment and Plan / ED Course  I have reviewed the triage vital signs and the nursing  notes.  Pertinent labs & imaging results that were available during my care of the patient were reviewed by me and considered in my medical decision making (see chart for details).     Patient here for evaluation of injuries following a mechanical fall yesterday.  She does have some right-sided chest wall tenderness without any deformity.  Imaging is negative for acute fracture.  She has no abdominal tenderness concerning for intra-abdominal injury.  She had outpatient labs performed 4 days ago with mild anemia and stable thrombocytopenia.  She is not at significant increased risk for bleeding.  No evidence of pneumothorax or pulmonary contusion.  Discussed with patient home care for chest wall contusion.  Discussed activity as tolerated, pain control, PCP follow-up and return precautions.  Final Clinical Impressions(s) / ED Diagnoses   Final diagnoses:  Contusion of right chest wall, initial encounter    ED Discharge Orders        Ordered    HYDROcodone-acetaminophen (NORCO/VICODIN) 5-325 MG tablet  Every 6 hours PRN     09/05/17 2007       Quintella Reichert, MD 09/05/17 2009

## 2017-09-22 ENCOUNTER — Other Ambulatory Visit (HOSPITAL_BASED_OUTPATIENT_CLINIC_OR_DEPARTMENT_OTHER): Payer: Medicare Other

## 2017-09-22 ENCOUNTER — Ambulatory Visit (HOSPITAL_BASED_OUTPATIENT_CLINIC_OR_DEPARTMENT_OTHER): Payer: Medicare Other

## 2017-09-22 VITALS — BP 119/87 | HR 72 | Temp 98.6°F | Resp 18

## 2017-09-22 DIAGNOSIS — N183 Chronic kidney disease, stage 3 (moderate): Secondary | ICD-10-CM | POA: Diagnosis present

## 2017-09-22 DIAGNOSIS — N189 Chronic kidney disease, unspecified: Principal | ICD-10-CM

## 2017-09-22 DIAGNOSIS — D469 Myelodysplastic syndrome, unspecified: Secondary | ICD-10-CM | POA: Diagnosis present

## 2017-09-22 DIAGNOSIS — D631 Anemia in chronic kidney disease: Secondary | ICD-10-CM

## 2017-09-22 LAB — CBC WITH DIFFERENTIAL/PLATELET
BASO%: 0.6 % (ref 0.0–2.0)
Basophils Absolute: 0 10*3/uL (ref 0.0–0.1)
EOS ABS: 0.1 10*3/uL (ref 0.0–0.5)
EOS%: 2.2 % (ref 0.0–7.0)
HCT: 30.4 % — ABNORMAL LOW (ref 34.8–46.6)
HEMOGLOBIN: 10.3 g/dL — AB (ref 11.6–15.9)
LYMPH%: 43 % (ref 14.0–49.7)
MCH: 28.5 pg (ref 25.1–34.0)
MCHC: 33.8 g/dL (ref 31.5–36.0)
MCV: 84.3 fL (ref 79.5–101.0)
MONO#: 0.2 10*3/uL (ref 0.1–0.9)
MONO%: 6.5 % (ref 0.0–14.0)
NEUT%: 47.7 % (ref 38.4–76.8)
NEUTROS ABS: 1.8 10*3/uL (ref 1.5–6.5)
Platelets: 140 10*3/uL — ABNORMAL LOW (ref 145–400)
RBC: 3.61 10*6/uL — ABNORMAL LOW (ref 3.70–5.45)
RDW: 15.2 % — AB (ref 11.2–14.5)
WBC: 3.8 10*3/uL — AB (ref 3.9–10.3)
lymph#: 1.6 10*3/uL (ref 0.9–3.3)

## 2017-09-22 MED ORDER — DARBEPOETIN ALFA 300 MCG/0.6ML IJ SOSY
PREFILLED_SYRINGE | INTRAMUSCULAR | Status: AC
  Filled 2017-09-22: qty 0.6

## 2017-09-22 MED ORDER — DARBEPOETIN ALFA 300 MCG/0.6ML IJ SOSY
300.0000 ug | PREFILLED_SYRINGE | INTRAMUSCULAR | Status: DC
  Administered 2017-09-22: 300 ug via SUBCUTANEOUS

## 2017-09-22 NOTE — Patient Instructions (Signed)
Darbepoetin Alfa injection What is this medicine? DARBEPOETIN ALFA (dar be POE e tin AL fa) helps your body make more red blood cells. It is used to treat anemia caused by chronic kidney failure and chemotherapy. This medicine may be used for other purposes; ask your health care provider or pharmacist if you have questions. COMMON BRAND NAME(S): Aranesp What should I tell my health care provider before I take this medicine? They need to know if you have any of these conditions: -blood clotting disorders or history of blood clots -cancer patient not on chemotherapy -cystic fibrosis -heart disease, such as angina, heart failure, or a history of a heart attack -hemoglobin level of 12 g/dL or greater -high blood pressure -low levels of folate, iron, or vitamin B12 -seizures -an unusual or allergic reaction to darbepoetin, erythropoietin, albumin, hamster proteins, latex, other medicines, foods, dyes, or preservatives -pregnant or trying to get pregnant -breast-feeding How should I use this medicine? This medicine is for injection into a vein or under the skin. It is usually given by a health care professional in a hospital or clinic setting. If you get this medicine at home, you will be taught how to prepare and give this medicine. Do not shake the solution before you withdraw a dose. Use exactly as directed. Take your medicine at regular intervals. Do not take your medicine more often than directed. It is important that you put your used needles and syringes in a special sharps container. Do not put them in a trash can. If you do not have a sharps container, call your pharmacist or healthcare provider to get one. Talk to your pediatrician regarding the use of this medicine in children. While this medicine may be used in children as young as 1 year for selected conditions, precautions do apply. Overdosage: If you think you have taken too much of this medicine contact a poison control center or  emergency room at once. NOTE: This medicine is only for you. Do not share this medicine with others. What if I miss a dose? If you miss a dose, take it as soon as you can. If it is almost time for your next dose, take only that dose. Do not take double or extra doses. What may interact with this medicine? Do not take this medicine with any of the following medications: -epoetin alfa This list may not describe all possible interactions. Give your health care provider a list of all the medicines, herbs, non-prescription drugs, or dietary supplements you use. Also tell them if you smoke, drink alcohol, or use illegal drugs. Some items may interact with your medicine. What should I watch for while using this medicine? Visit your prescriber or health care professional for regular checks on your progress and for the needed blood tests and blood pressure measurements. It is especially important for the doctor to make sure your hemoglobin level is in the desired range, to limit the risk of potential side effects and to give you the best benefit. Keep all appointments for any recommended tests. Check your blood pressure as directed. Ask your doctor what your blood pressure should be and when you should contact him or her. As your body makes more red blood cells, you may need to take iron, folic acid, or vitamin B supplements. Ask your doctor or health care provider which products are right for you. If you have kidney disease continue dietary restrictions, even though this medication can make you feel better. Talk with your doctor or health   care professional about the foods you eat and the vitamins that you take. What side effects may I notice from receiving this medicine? Side effects that you should report to your doctor or health care professional as soon as possible: -allergic reactions like skin rash, itching or hives, swelling of the face, lips, or tongue -breathing problems -changes in vision -chest  pain -confusion, trouble speaking or understanding -feeling faint or lightheaded, falls -high blood pressure -muscle aches or pains -pain, swelling, warmth in the leg -rapid weight gain -severe headaches -sudden numbness or weakness of the face, arm or leg -trouble walking, dizziness, loss of balance or coordination -seizures (convulsions) -swelling of the ankles, feet, hands -unusually weak or tired Side effects that usually do not require medical attention (report to your doctor or health care professional if they continue or are bothersome): -diarrhea -fever, chills (flu-like symptoms) -headaches -nausea, vomiting -redness, stinging, or swelling at site where injected This list may not describe all possible side effects. Call your doctor for medical advice about side effects. You may report side effects to FDA at 1-800-FDA-1088. Where should I keep my medicine? Keep out of the reach of children. Store in a refrigerator between 2 and 8 degrees C (36 and 46 degrees F). Do not freeze. Do not shake. Throw away any unused portion if using a single-dose vial. Throw away any unused medicine after the expiration date. NOTE: This sheet is a summary. It may not cover all possible information. If you have questions about this medicine, talk to your doctor, pharmacist, or health care provider.  2015, [employer]. (2008-09-02 10:23:57)  

## 2017-10-13 ENCOUNTER — Ambulatory Visit: Payer: Medicare Other

## 2017-10-13 ENCOUNTER — Other Ambulatory Visit: Payer: Medicare Other

## 2017-10-20 ENCOUNTER — Inpatient Hospital Stay: Payer: Medicare Other

## 2017-10-20 ENCOUNTER — Inpatient Hospital Stay: Payer: Medicare Other | Attending: Oncology

## 2017-10-20 DIAGNOSIS — D649 Anemia, unspecified: Secondary | ICD-10-CM | POA: Diagnosis not present

## 2017-10-20 DIAGNOSIS — D469 Myelodysplastic syndrome, unspecified: Secondary | ICD-10-CM | POA: Diagnosis not present

## 2017-10-20 LAB — IRON AND TIBC
Iron: 101 ug/dL (ref 41–142)
SATURATION RATIOS: 42 % (ref 21–57)
TIBC: 241 ug/dL (ref 236–444)
UIBC: 140 ug/dL

## 2017-10-20 LAB — CBC WITH DIFFERENTIAL/PLATELET
BASOS ABS: 0 10*3/uL (ref 0.0–0.1)
Basophils Relative: 1 %
Eosinophils Absolute: 0.1 10*3/uL (ref 0.0–0.5)
Eosinophils Relative: 2 %
HEMATOCRIT: 33.6 % — AB (ref 34.8–46.6)
HEMOGLOBIN: 11.2 g/dL — AB (ref 11.6–15.9)
LYMPHS ABS: 1.2 10*3/uL (ref 0.9–3.3)
LYMPHS PCT: 33 %
MCH: 28.9 pg (ref 25.1–34.0)
MCHC: 33.4 g/dL (ref 31.5–36.0)
MCV: 86.5 fL (ref 79.5–101.0)
Monocytes Absolute: 0.3 10*3/uL (ref 0.1–0.9)
Monocytes Relative: 9 %
NEUTROS ABS: 2 10*3/uL (ref 1.5–6.5)
NEUTROS PCT: 55 %
Platelets: 99 10*3/uL — ABNORMAL LOW (ref 145–400)
RBC: 3.88 MIL/uL (ref 3.70–5.45)
RDW: 16.3 % — ABNORMAL HIGH (ref 11.2–16.1)
WBC: 3.5 10*3/uL — AB (ref 3.9–10.3)

## 2017-10-20 LAB — FERRITIN: FERRITIN: 83 ng/mL (ref 9–269)

## 2017-10-20 MED ORDER — DARBEPOETIN ALFA 300 MCG/0.6ML IJ SOSY
PREFILLED_SYRINGE | INTRAMUSCULAR | Status: AC
  Filled 2017-10-20: qty 0.6

## 2017-10-20 NOTE — Progress Notes (Signed)
Injection not given today due to parameters. Hgb 11.2. Lab results were given to pt. Susane Bey LPN

## 2017-11-01 ENCOUNTER — Encounter: Payer: Self-pay | Admitting: Internal Medicine

## 2017-11-03 ENCOUNTER — Telehealth: Payer: Self-pay | Admitting: Oncology

## 2017-11-03 ENCOUNTER — Inpatient Hospital Stay: Payer: Medicare Other

## 2017-11-03 ENCOUNTER — Inpatient Hospital Stay: Payer: Medicare Other | Attending: Oncology | Admitting: Oncology

## 2017-11-03 VITALS — BP 172/79 | HR 64 | Temp 97.9°F | Resp 18 | Ht 62.25 in | Wt 163.3 lb

## 2017-11-03 DIAGNOSIS — D649 Anemia, unspecified: Secondary | ICD-10-CM

## 2017-11-03 DIAGNOSIS — D469 Myelodysplastic syndrome, unspecified: Secondary | ICD-10-CM | POA: Diagnosis not present

## 2017-11-03 DIAGNOSIS — D509 Iron deficiency anemia, unspecified: Secondary | ICD-10-CM

## 2017-11-03 DIAGNOSIS — D631 Anemia in chronic kidney disease: Secondary | ICD-10-CM

## 2017-11-03 DIAGNOSIS — N189 Chronic kidney disease, unspecified: Secondary | ICD-10-CM | POA: Diagnosis not present

## 2017-11-03 LAB — CBC WITH DIFFERENTIAL/PLATELET
BASOS ABS: 0 10*3/uL (ref 0.0–0.1)
Basophils Relative: 1 %
Eosinophils Absolute: 0.1 10*3/uL (ref 0.0–0.5)
Eosinophils Relative: 2 %
HCT: 29.9 % — ABNORMAL LOW (ref 34.8–46.6)
Hemoglobin: 9.9 g/dL — ABNORMAL LOW (ref 11.6–15.9)
LYMPHS PCT: 37 %
Lymphs Abs: 1.6 10*3/uL (ref 0.9–3.3)
MCH: 28.4 pg (ref 25.1–34.0)
MCHC: 33.1 g/dL (ref 31.5–36.0)
MCV: 85.8 fL (ref 79.5–101.0)
Monocytes Absolute: 0.3 10*3/uL (ref 0.1–0.9)
Monocytes Relative: 8 %
NEUTROS ABS: 2.2 10*3/uL (ref 1.5–6.5)
Neutrophils Relative %: 52 %
Platelets: 150 10*3/uL (ref 145–400)
RBC: 3.49 MIL/uL — ABNORMAL LOW (ref 3.70–5.45)
RDW: 15.2 % — ABNORMAL HIGH (ref 11.2–14.5)
WBC: 4.2 10*3/uL (ref 3.9–10.3)

## 2017-11-03 MED ORDER — DARBEPOETIN ALFA 300 MCG/0.6ML IJ SOSY
300.0000 ug | PREFILLED_SYRINGE | INTRAMUSCULAR | Status: DC
  Administered 2017-11-03: 300 ug via SUBCUTANEOUS

## 2017-11-03 MED ORDER — DARBEPOETIN ALFA 300 MCG/0.6ML IJ SOSY
PREFILLED_SYRINGE | INTRAMUSCULAR | Status: AC
  Filled 2017-11-03: qty 0.6

## 2017-11-03 NOTE — Patient Instructions (Signed)
Darbepoetin Alfa injection What is this medicine? DARBEPOETIN ALFA (dar be POE e tin AL fa) helps your body make more red blood cells. It is used to treat anemia caused by chronic kidney failure and chemotherapy. This medicine may be used for other purposes; ask your health care provider or pharmacist if you have questions. COMMON BRAND NAME(S): Aranesp What should I tell my health care provider before I take this medicine? They need to know if you have any of these conditions: -blood clotting disorders or history of blood clots -cancer patient not on chemotherapy -cystic fibrosis -heart disease, such as angina, heart failure, or a history of a heart attack -hemoglobin level of 12 g/dL or greater -high blood pressure -low levels of folate, iron, or vitamin B12 -seizures -an unusual or allergic reaction to darbepoetin, erythropoietin, albumin, hamster proteins, latex, other medicines, foods, dyes, or preservatives -pregnant or trying to get pregnant -breast-feeding How should I use this medicine? This medicine is for injection into a vein or under the skin. It is usually given by a health care professional in a hospital or clinic setting. If you get this medicine at home, you will be taught how to prepare and give this medicine. Do not shake the solution before you withdraw a dose. Use exactly as directed. Take your medicine at regular intervals. Do not take your medicine more often than directed. It is important that you put your used needles and syringes in a special sharps container. Do not put them in a trash can. If you do not have a sharps container, call your pharmacist or healthcare provider to get one. Talk to your pediatrician regarding the use of this medicine in children. While this medicine may be used in children as young as 1 year for selected conditions, precautions do apply. Overdosage: If you think you have taken too much of this medicine contact a poison control center or  emergency room at once. NOTE: This medicine is only for you. Do not share this medicine with others. What if I miss a dose? If you miss a dose, take it as soon as you can. If it is almost time for your next dose, take only that dose. Do not take double or extra doses. What may interact with this medicine? Do not take this medicine with any of the following medications: -epoetin alfa This list may not describe all possible interactions. Give your health care provider a list of all the medicines, herbs, non-prescription drugs, or dietary supplements you use. Also tell them if you smoke, drink alcohol, or use illegal drugs. Some items may interact with your medicine. What should I watch for while using this medicine? Visit your prescriber or health care professional for regular checks on your progress and for the needed blood tests and blood pressure measurements. It is especially important for the doctor to make sure your hemoglobin level is in the desired range, to limit the risk of potential side effects and to give you the best benefit. Keep all appointments for any recommended tests. Check your blood pressure as directed. Ask your doctor what your blood pressure should be and when you should contact him or her. As your body makes more red blood cells, you may need to take iron, folic acid, or vitamin B supplements. Ask your doctor or health care provider which products are right for you. If you have kidney disease continue dietary restrictions, even though this medication can make you feel better. Talk with your doctor or health   care professional about the foods you eat and the vitamins that you take. What side effects may I notice from receiving this medicine? Side effects that you should report to your doctor or health care professional as soon as possible: -allergic reactions like skin rash, itching or hives, swelling of the face, lips, or tongue -breathing problems -changes in vision -chest  pain -confusion, trouble speaking or understanding -feeling faint or lightheaded, falls -high blood pressure -muscle aches or pains -pain, swelling, warmth in the leg -rapid weight gain -severe headaches -sudden numbness or weakness of the face, arm or leg -trouble walking, dizziness, loss of balance or coordination -seizures (convulsions) -swelling of the ankles, feet, hands -unusually weak or tired Side effects that usually do not require medical attention (report to your doctor or health care professional if they continue or are bothersome): -diarrhea -fever, chills (flu-like symptoms) -headaches -nausea, vomiting -redness, stinging, or swelling at site where injected This list may not describe all possible side effects. Call your doctor for medical advice about side effects. You may report side effects to FDA at 1-800-FDA-1088. Where should I keep my medicine? Keep out of the reach of children. Store in a refrigerator between 2 and 8 degrees C (36 and 46 degrees F). Do not freeze. Do not shake. Throw away any unused portion if using a single-dose vial. Throw away any unused medicine after the expiration date. NOTE: This sheet is a summary. It may not cover all possible information. If you have questions about this medicine, talk to your doctor, pharmacist, or health care provider.  2015, [employer]. (2008-09-02 10:23:57)  

## 2017-11-03 NOTE — Progress Notes (Signed)
Al consent given by Dr. Alen Blew to give Aranesp 300 with BP of 172/79

## 2017-11-03 NOTE — Telephone Encounter (Signed)
Gave avs and calendar for February- September

## 2017-11-03 NOTE — Progress Notes (Signed)
Hematology and Oncology Follow Up Visit  Susan [name] [id number] 07-May-1941 77 y.o. 11/03/2017 9:08 AM Unk Pinto, MDMcKeown, [name], MD   Principle Diagnosis:  77 year old woman with anemia of renal disease diagnosed in 2010.  She has element of iron deficiency as well.  Current therapy: She is on Aranesp 300 mcg every 3 weeks to keep her hemoglobin above 11.  She will also receive iron replacement as needed.  Interim History:  Susan [name] is here for a follow-up.  She has been doing very well since the last visit without any major complaints.  She does report some mild fatigue and tiredness on her hemoglobin is below 10.  She denied any dyspnea on exertion or shortness of breath.  She denies any hematochezia or melena.  Her blood pressure has been under control when she takes her blood pressure medication which she has not taken it this morning.  Her appetite and performance status remain excellent.  She was able to travel to [place] without difficulties.  She was diagnosed with a recent sinus congestion and upper respiratory tract symptoms which has resolved at this time.  She does not report any headaches, blurry vision, syncope or seizures. He has not reported any fevers or chills or sweats. She has not reported any chest pain or palpitation. Does not report any shortness of breath or wheezing. She does not report any abdominal pain or early satiety.  She does not report any frequency urgency or hesitancy.  She does not report any arthralgias or myalgias.  She does not report any heat or cold intolerance.  Rest of her review of systems is negative.   Medications: I have reviewed the patient's current medications.  Current Outpatient Medications  Medication Sig Dispense Refill  . acetaminophen (TYLENOL) 500 MG tablet Take 500-1,000 mg by mouth every 6 (six) hours as needed (for pain.).    [name] amLODipine (NORVASC) 10 MG tablet TAKE 1/2 TABLET BY MOUTH DAILY AT BEDTIME; (TAKE 1 TABLET IF  SBP IS >150) 90 tablet 0  . aspirin 81 MG chewable tablet Chew 81 mg by mouth daily.     . Cholecalciferol (VITAMIN D-3) 5000 UNITS TABS Take 15,000 Units by mouth daily.     . ciprofloxacin (CIPRO) 250 MG tablet Take 1 tablet (250 mg total) by mouth 2 (two) times daily. 14 tablet 0  . Darbepoetin Alfa (ARANESP, ALBUMIN FREE,) 300 MCG/ML SOLN Inject 300 mcg into the skin See admin instructions. USE INJECTION EVERY 3 WEEKS AS NEEDED FOR HEMOGLOBIN LESS THAN 11.    . diphenhydrAMINE (BENADRYL) 25 mg capsule One capsule every 6 hours x 3 days, then as needed for symptoms of rash/itching 30 capsule 0  . HYDROcodone-acetaminophen (NORCO/VICODIN) 5-325 MG tablet Take 1 tablet by mouth every 6 (six) hours as needed. 8 tablet 0  . hydroxypropyl methylcellulose / hypromellose (ISOPTO TEARS / GONIOVISC) 2.5 % ophthalmic solution Place 1-2 drops into both eyes 3 (three) times daily as needed for dry eyes.    [name] labetalol (NORMODYNE) 300 MG tablet TAKE 1 TABLET BY MOUTH TWICE DAILY 180 tablet 1  . levothyroxine (SYNTHROID, LEVOTHROID) 100 MCG tablet TAKE 1 TABLET BY MOUTH DAILY ON AN EMPTY STOMACH FOR 30 MINUTES 90 tablet 0  . losartan-hydrochlorothiazide (HYZAAR) 100-25 MG tablet Take 1 tablet by mouth daily. 90 tablet 0  . Magnesium 500 MG TABS Take 500 mg by mouth at bedtime as needed (leg cramps).    . Menthol-Methyl Salicylate (EQL COOL HEAT EXTRA STRENGTH EX) Apply  1 application topically 3 (three) times daily as needed (for pain.).    [name] pantoprazole (PROTONIX) 40 MG tablet TAKE 1 TABLET BY MOUTH EVERY DAY FOR ACID REFLUX 90 tablet 0  . ranitidine (ZANTAC) 150 MG tablet Take 1 tablet (150 mg total) by mouth 2 (two) times daily. 180 tablet 3   No current facility-administered medications for this visit.    Facility-Administered Medications Ordered in Other Visits  Medication Dose Route Frequency Provider Last Rate Last Dose  . Darbepoetin Alfa (ARANESP) injection 300 mcg  300 mcg Subcutaneous Q21 days  Maryanna Shape, NP   300 mcg at 11/03/17 3893     Past Medical History, Surgical history, Social history, and Family History personally reviewed again today and unchanged.  [name]  Physical Exam: Blood pressure (!) 172/79, pulse 64, temperature 97.9 F (36.6 C), temperature source Oral, resp. rate 18, height 5' 2.25" (1.581 m), weight 163 lb 4.8 oz (74.1 kg), SpO2 100 %. ECOG: 1 General appearance: Alert, awake pleasant woman without distress. Head: Normocephalic, without obvious abnormality  Oropharynx: No oral thrush or ulcers. Eyes: No scleral icterus. Lymph nodes: Cervical, supraclavicular, and axillary nodes normal. Heart:regular rate and rhythm, S1, S2 normal, no murmur, click, rub or gallop Lung:chest clear in all lung fields, no wheezing or dullness to percussion. Abdomen: Good bowel sounds in all 4 quadrants.  Soft, nontender without rebound or guarding. Skull skeletal: No joint deformity or effusion.  Good range of motion. Skin: No ecchymosis or petechiae.  Lab Results: Lab Results  Component Value Date   WBC 4.2 11/03/2017   HGB 9.9 (L) 11/03/2017   HCT 29.9 (L) 11/03/2017   MCV 85.8 11/03/2017   PLT 150 11/03/2017     Chemistry      Component Value Date/Time   NA 139 07/28/2017 1100   NA 143 06/30/2017 0846   K 3.9 07/28/2017 1100   K 4.4 06/30/2017 0846   CL 104 07/28/2017 1100   CO2 26 07/28/2017 1100   CO2 24 06/30/2017 0846   BUN 32 (H) 07/28/2017 1100   BUN 26.5 (H) 06/30/2017 0846   CREATININE 1.68 (H) 07/28/2017 1100   CREATININE 1.4 (H) 06/30/2017 0846      Component Value Date/Time   CALCIUM 9.4 07/28/2017 1100   CALCIUM 10.1 06/30/2017 0846   ALKPHOS 101 06/30/2017 0846   AST 17 06/30/2017 0846   ALT <6 06/30/2017 0846   BILITOT 0.76 06/30/2017 0846      Impression and Plan:   77 year old female with the following issues:  1. Anemia: Due to renal insufficiency as well as iron deficiency.  Her iron studies from January 2019 were reviewed  and continues to be within normal range.  Her hemoglobin today is 9.9 and she is mildly symptomatic.  Risks and benefits of continuing Aranesp was discussed today.  Long-term complication associated with this medication was reviewed including short-term issues such as hypertension and thrombosis.  After discussion today she is agreeable to continue and attempt to keep her hemoglobin close to 11. 2. Rectal bleeding and incontinence: No issues reported since last visit.  This has resolved. 3. 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  Her counts do not suggest worsening cytopenias and a bone marrow biopsy will be repeated if any abnormalities detected.  The natural course of this disease as well as implication of treatments were reviewed today and will continue to be addressed in the future.  4. Follow up. Every 3 weeks for CBC and injection. Clinical visit in 6 months.    15  minutes was spent with the patient face-to-face today.  More than 50% of time was dedicated to patient counseling, education and coordination of her care.       Zola Button MD 2/1/20199:08 AM

## 2017-11-07 ENCOUNTER — Ambulatory Visit (INDEPENDENT_AMBULATORY_CARE_PROVIDER_SITE_OTHER): Payer: Medicare Other | Admitting: Internal Medicine

## 2017-11-07 ENCOUNTER — Encounter: Payer: Self-pay | Admitting: Internal Medicine

## 2017-11-07 VITALS — BP 138/76 | HR 60 | Temp 97.5°F | Resp 16 | Ht 62.5 in | Wt 163.0 lb

## 2017-11-07 DIAGNOSIS — Z79899 Other long term (current) drug therapy: Secondary | ICD-10-CM

## 2017-11-07 DIAGNOSIS — Z1211 Encounter for screening for malignant neoplasm of colon: Secondary | ICD-10-CM

## 2017-11-07 DIAGNOSIS — E782 Mixed hyperlipidemia: Secondary | ICD-10-CM | POA: Diagnosis not present

## 2017-11-07 DIAGNOSIS — E039 Hypothyroidism, unspecified: Secondary | ICD-10-CM | POA: Diagnosis not present

## 2017-11-07 DIAGNOSIS — Z1239 Encounter for other screening for malignant neoplasm of breast: Secondary | ICD-10-CM

## 2017-11-07 DIAGNOSIS — E559 Vitamin D deficiency, unspecified: Secondary | ICD-10-CM

## 2017-11-07 DIAGNOSIS — I10 Essential (primary) hypertension: Secondary | ICD-10-CM | POA: Diagnosis not present

## 2017-11-07 DIAGNOSIS — N183 Chronic kidney disease, stage 3 unspecified: Secondary | ICD-10-CM

## 2017-11-07 DIAGNOSIS — N189 Chronic kidney disease, unspecified: Secondary | ICD-10-CM

## 2017-11-07 DIAGNOSIS — E2839 Other primary ovarian failure: Secondary | ICD-10-CM

## 2017-11-07 DIAGNOSIS — Z136 Encounter for screening for cardiovascular disorders: Secondary | ICD-10-CM | POA: Diagnosis not present

## 2017-11-07 DIAGNOSIS — D631 Anemia in chronic kidney disease: Secondary | ICD-10-CM

## 2017-11-07 DIAGNOSIS — Z1212 Encounter for screening for malignant neoplasm of rectum: Secondary | ICD-10-CM

## 2017-11-07 DIAGNOSIS — R7309 Other abnormal glucose: Secondary | ICD-10-CM | POA: Diagnosis not present

## 2017-11-07 DIAGNOSIS — K219 Gastro-esophageal reflux disease without esophagitis: Secondary | ICD-10-CM

## 2017-11-07 MED ORDER — RANITIDINE HCL 300 MG PO TABS: ORAL_TABLET | ORAL | 3 refills | Status: DC

## 2017-11-07 NOTE — Progress Notes (Signed)
[facility] Unk Pinto, M.D.     Uvaldo Bristle. Silverio Lay, P.A.-C [name], [place] [address] [place] [address] Telephone [phone] Telefax [phone] Annual Screening/Preventative Visit & Comprehensive Evaluation &  Examination     This very nice 77 y.o. WBF presents for a Screening/Preventative Visit & comprehensive evaluation and management of multiple medical co-morbidities.  Patient has been followed for HTN, CKD4, Hypothyroidism, Prediabetes, Hyperlipidemia and Vitamin D Deficiency. Patient has GERD and has been taking  a friend's Protonix as she reports her ranitidine 150 mg bid is not controlling her pyrosis.       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 Patient is followed at 3 weekly intervals for CBC and she may receive Aranesp inj to maintain Hgb of 11.0 gm%      HTN predates since the 1980's. Patient's BP has been controlled at home and patient denies any cardiac symptoms as chest pain, palpitations, shortness of breath, dizziness or ankle swelling. Today's BP is at goal - 138/76.      Patient's hyperlipidemia is not controlled with diet and patient has been reticent to take meds for Cholesterol. Last lipids were not at goal: Lab Results  Component Value Date   CHOL 199 01/31/2017   HDL 67 01/31/2017   LDLCALC 117 (H) 01/31/2017   TRIG 75 01/31/2017   CHOLHDL 3.0 01/31/2017      Patient has Morbid Obesity (BMI 30+) and she is monitored expectantly for prediabetes.  She denies reactive hypoglycemic symptoms, visual blurring, diabetic polys or paresthesias. Last A1c was Normal & at goal: Lab Results  Component Value Date   HGBA1C 4.3 07/27/2016       Patient was dx'd Hypothyroid in 2011 and has been on thyroid replacement since. Finally, patient has history of Vitamin D Deficiency ("27"/2008) and last Vitamin D was sl low (goal 70-100): Lab Results   Component Value Date   VD25OH 48 04/11/2016   Current Outpatient Medications on File Prior to Visit  Medication Sig  . acetaminophen (TYLENOL) 500 MG tablet Take 500-1,000 mg by mouth every 6 (six) hours as needed (for pain.).  [name] amLODipine (NORVASC) 10 MG tablet TAKE 1/2 TABLET BY MOUTH DAILY AT BEDTIME; (TAKE 1 TABLET IF SBP IS >150)  . aspirin 81 MG chewable tablet Chew 81 mg by mouth daily.   . Cholecalciferol (VITAMIN D-3) 5000 UNITS TABS Take 15,000 Units by mouth daily.   . Darbepoetin Alfa (ARANESP, ALBUMIN FREE,) 300 MCG/ML SOLN Inject 300 mcg into the skin See admin instructions. USE INJECTION EVERY 3 WEEKS AS NEEDED FOR HEMOGLOBIN LESS THAN 11.  . diphenhydrAMINE (BENADRYL) 25 mg capsule One capsule every 6 hours x 3 days, then as needed for symptoms of rash/itching  . hydroxypropyl methylcellulose / hypromellose (ISOPTO TEARS / GONIOVISC) 2.5 % ophthalmic solution Place 1-2 drops into both eyes 3 (three) times daily as needed for dry eyes.  [name] labetalol (NORMODYNE) 300 MG tablet TAKE 1 TABLET BY MOUTH TWICE DAILY  . levothyroxine (SYNTHROID, LEVOTHROID) 100 MCG tablet TAKE 1 TABLET BY MOUTH DAILY ON AN EMPTY STOMACH FOR 30 MINUTES  . losartan-hydrochlorothiazide (HYZAAR) 100-25 MG tablet Take 1 tablet by mouth daily.  . Magnesium 500 MG TABS Take 500 mg by mouth at bedtime as needed (leg cramps).  . ranitidine (ZANTAC) 150 MG tablet Take 1 tablet (150 mg total) by mouth 2 (two) times daily.   No current facility-administered medications  on file prior to visit.    Allergies  Allergen Reactions  . Codeine Anaphylaxis  . Penicillins Anaphylaxis  . Bactrim Itching and Swelling  . Clarithromycin Other (See Comments)    "Caused skin to peel off my hand."  . Flagyl [Metronidazole Hcl] Itching and Swelling  . Food Other (See Comments)    EATS WHITE RICE ONLY (ALLERGIC REACTION TO YELLOW OR BROWN RICE THAT RESULTED IN HOSPITALIZATION)  . Pineapple Itching and Swelling    SWELLING  OF EYES  . Sulfa Antibiotics Itching and Swelling   Past Medical History:  Diagnosis Date  . Anemia   . Arthritis   . Blood transfusion without reported diagnosis   . GERD (gastroesophageal reflux disease)   . HLD (hyperlipidemia)   . Hypertension   . Hypothyroidism   . Myelodysplasia   . Vitamin D deficiency    Health Maintenance  Topic Date Due  . DEXA SCAN  07/18/2004  . TETANUS/TDAP  09/17/2018 (Originally 10/04/2015)   Immunization History  Administered Date(s) Administered  . Td 10/03/2005   - Last Colon - 05.11.2018 Dr Silverio Decamp   - Scheduling for [facility] and dexa BMD at [facility].   Past Surgical History:  Procedure Laterality Date  . CARPAL TUNNEL RELEASE Right   . DILATION AND CURETTAGE OF UTERUS    . INGUINAL HERNIA REPAIR    . ROTATOR CUFF REPAIR Left   . TRANSANAL HEMORRHOIDAL DEARTERIALIZATION N/A 03/31/2017   Procedure: TRANSANAL HEMORRHOIDAL DEARTERIALIZATION;  Surgeon: Leighton Ruff, MD;  Location: [facility];  Service: General;  Laterality: N/A;  . UMBILICAL HERNIA REPAIR     Family History  Problem Relation Age of Onset  . Stroke Father   . Hypertension Father   . Other Mother        died at early age  . Stroke Sister   . Coronary artery disease Brother   . Hypertension Sister   . Bladder Cancer Sister   . Colon cancer Neg Hx   . Stomach cancer Neg Hx    Social History   Tobacco Use  . Smoking status: Former Smoker    Types: Cigarettes    Last attempt to quit: 10/02/1996    Years since quitting: 21.1  . Smokeless tobacco: Never Used  Substance Use Topics  . Alcohol use: Yes    Alcohol/week: 0.0 oz    Comment: glass of wine at least once a month.  . Drug use: No    ROS Constitutional: Denies fever, chills, weight loss/gain, headaches, insomnia,  night sweats, and change in appetite. Does c/o fatigue. Eyes: Denies redness, blurred vision, diplopia, discharge, itchy, watery eyes.  ENT: Denies discharge, congestion, post nasal drip, epistaxis, sore  throat, earache, hearing loss, dental pain, Tinnitus, Vertigo, Sinus pain, snoring.  Cardio: Denies chest pain, palpitations, irregular heartbeat, syncope, dyspnea, diaphoresis, orthopnea, PND, claudication, edema Respiratory: denies cough, dyspnea, DOE, pleurisy, hoarseness, laryngitis, wheezing.  Gastrointestinal: Denies dysphagia, heartburn, reflux, water brash, pain, cramps, nausea, vomiting, bloating, diarrhea, constipation, hematemesis, melena, hematochezia, jaundice, hemorrhoids Genitourinary: Denies dysuria, frequency, urgency, nocturia, hesitancy, discharge, hematuria, flank pain Breast: Breast lumps, nipple discharge, bleeding.  Musculoskeletal: Denies arthralgia, myalgia, stiffness, Jt. Swelling, pain, limp, and strain/sprain. Denies falls. Skin: Denies puritis, rash, hives, warts, acne, eczema, changing in skin lesion Neuro: No weakness, tremor, incoordination, spasms, paresthesia, pain Psychiatric: Denies confusion, memory loss, sensory loss. Denies Depression. Endocrine: Denies change in weight, skin, hair change, nocturia, and paresthesia, diabetic polys, visual blurring, hyper / hypo glycemic episodes.  Heme/Lymph: No  excessive bleeding, bruising, enlarged lymph nodes.  Physical Exam  BP 138/76   Pulse 60   Temp (!) 97.5 F (36.4 C)   Resp 16   Ht 5' 2.5" (1.588 m)   Wt 163 lb (73.9 kg)   BMI 29.34 kg/m   General Appearance: Well nourished, well groomed and in no apparent distress.  Eyes: PERRLA, EOMs, conjunctiva no swelling or erythema, normal fundi and vessels. Sinuses: No frontal/maxillary tenderness ENT/Mouth: EACs patent / TMs  nl. Nares clear without erythema, swelling, mucoid exudates. Oral hygiene is good. No erythema, swelling, or exudate. Tongue normal, non-obstructing. Tonsils not swollen or erythematous. Hearing normal.  Neck: Supple, thyroid normal. No bruits, nodes or JVD. Respiratory: Respiratory effort normal.  BS equal and clear bilateral without  rales, rhonci, wheezing or stridor. Cardio: Heart sounds are normal with regular rate and rhythm and no murmurs, rubs or gallops. Peripheral pulses are normal and equal bilaterally without edema. No aortic or femoral bruits. Chest: symmetric with normal excursions and percussion. Breasts: Symmetric, without lumps, nipple discharge, retractions, or fibrocystic changes.  Abdomen: Flat, soft with bowel sounds active. Nontender, no guarding, rebound, hernias, masses, or organomegaly.  Lymphatics: Non tender without lymphadenopathy.  Genitourinary:  Musculoskeletal: Full ROM all peripheral extremities, joint stability, 5/5 strength, and normal gait. Skin: Warm and dry without rashes, lesions, cyanosis, clubbing or  ecchymosis.  Neuro: Cranial nerves intact, reflexes equal bilaterally. Normal muscle tone, no cerebellar symptoms. Sensation intact.  Pysch: Alert and oriented X 3, normal affect, Insight and Judgment appropriate.   Assessment and Plan  1. Essential hypertension  - EKG 12-Lead - Urinalysis, Routine w reflex microscopic - Microalbumin / creatinine urine ratio - BASIC METABOLIC PANEL WITH GFR - Magnesium - TSH  2. Hyperlipidemia, mixed  - EKG 12-Lead - Hepatic function panel - Lipid panel - TSH  3. Abnormal glucose  - Hemoglobin A1c - Insulin, random  4. Vitamin D deficiency  - VITAMIN D 25 Hydroxy   5. CKD, 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)  - BASIC METABOLIC PANEL WITH GFR  6. Anemia of renal disease  - BASIC METABOLIC PANEL WITH GFR  7. Hypothyroidism  - TSH  8. Screening for colorectal cancer  - POC Hemoccult Bld/Stl   9. Screening for ischemic heart disease  - EKG 12-Lead  10. Estrogen deficiency  - DG Bone Density; Future  11. Breast cancer screening  - MM Digital Screening; Future  12. Medication management  - Urinalysis, Routine w reflex microscopic - Microalbumin / creatinine urine ratio - BASIC METABOLIC PANEL WITH GFR - Hepatic function  panel - Magnesium - Lipid panel - TSH - Hemoglobin A1c - Insulin, random - VITAMIN D 25 Hydroxy        Patient was counseled in prudent diet to achieve/maintain BMI less than 25 for weight control, BP monitoring, regular exercise and medications. Will increase her Ranitidine from 150 to 300 mg bid to avoid PPI's.  Discussed med's effects and SE's. Screening labs and tests as requested with regular follow-up as recommended. Over 40 minutes of exam, counseling, chart review and high complex critical decision making was performed.

## 2017-11-07 NOTE — Patient Instructions (Signed)

## 2017-11-08 LAB — URINALYSIS, ROUTINE W REFLEX MICROSCOPIC
BACTERIA UA: NONE SEEN /HPF
Bilirubin Urine: NEGATIVE
Glucose, UA: NEGATIVE
HGB URINE DIPSTICK: NEGATIVE
HYALINE CAST: NONE SEEN /LPF
Leukocytes, UA: NEGATIVE
Nitrite: NEGATIVE
RBC / HPF: NONE SEEN /HPF (ref 0–2)
SPECIFIC GRAVITY, URINE: 1.025 (ref 1.001–1.03)

## 2017-11-08 LAB — LIPID PANEL
CHOL/HDL RATIO: 2.8 (calc) (ref <5.0)
CHOLESTEROL: 189 mg/dL (ref <200)
HDL: 67 mg/dL (ref >50)
LDL CHOLESTEROL (CALC): 101 mg/dL — AB
Non-HDL Cholesterol (Calc): 122 mg/dL (calc) (ref <130)
TRIGLYCERIDES: 116 mg/dL (ref <150)

## 2017-11-08 LAB — BASIC METABOLIC PANEL WITH GFR
BUN/Creatinine Ratio: 15 (calc) (ref 6–22)
BUN: 22 mg/dL (ref 7–25)
CALCIUM: 9.4 mg/dL (ref 8.6–10.4)
CHLORIDE: 108 mmol/L (ref 98–110)
CO2: 23 mmol/L (ref 20–32)
Creat: 1.51 mg/dL — ABNORMAL HIGH (ref 0.60–0.93)
GFR, EST AFRICAN AMERICAN: 38 mL/min/{1.73_m2} — AB (ref >=60)
GFR, EST NON AFRICAN AMERICAN: 33 mL/min/{1.73_m2} — AB (ref >=60)
Glucose, Bld: 112 mg/dL — ABNORMAL HIGH (ref 65–99)
Potassium: 3.6 mmol/L (ref 3.5–5.3)
Sodium: 141 mmol/L (ref 135–146)

## 2017-11-08 LAB — HEPATIC FUNCTION PANEL
AG Ratio: 1.4 (calc) (ref 1.0–2.5)
ALBUMIN MSPROF: 4.2 g/dL (ref 3.6–5.1)
ALT: 8 U/L (ref 6–29)
AST: 15 U/L (ref 10–35)
Alkaline phosphatase (APISO): 87 U/L (ref 33–130)
BILIRUBIN DIRECT: 0.1 mg/dL (ref 0.0–0.2)
BILIRUBIN TOTAL: 0.4 mg/dL (ref 0.2–1.2)
Globulin: 3.1 g/dL (calc) (ref 1.9–3.7)
Indirect Bilirubin: 0.3 mg/dL (calc) (ref 0.2–1.2)
Total Protein: 7.3 g/dL (ref 6.1–8.1)

## 2017-11-08 LAB — INSULIN, RANDOM: Insulin: 31.1 u[IU]/mL — ABNORMAL HIGH (ref 2.0–19.6)

## 2017-11-08 LAB — MAGNESIUM: MAGNESIUM: 1.7 mg/dL (ref 1.5–2.5)

## 2017-11-08 LAB — TSH: TSH: 3.13 mIU/L (ref 0.40–4.50)

## 2017-11-08 LAB — MICROALBUMIN / CREATININE URINE RATIO
Creatinine, Urine: 238 mg/dL (ref 20–275)
MICROALB UR: 63.6 mg/dL
MICROALB/CREAT RATIO: 267 ug/mg{creat} — AB (ref <30)

## 2017-11-08 LAB — HEMOGLOBIN A1C
Hgb A1c MFr Bld: 4.6 % of total Hgb (ref <5.7)
MEAN PLASMA GLUCOSE: 85 (calc)
eAG (mmol/L): 4.7 (calc)

## 2017-11-08 LAB — VITAMIN D 25 HYDROXY (VIT D DEFICIENCY, FRACTURES): VIT D 25 HYDROXY: 48 ng/mL (ref 30–100)

## 2017-11-15 ENCOUNTER — Encounter: Payer: Self-pay | Admitting: *Deleted

## 2017-11-22 DIAGNOSIS — Z1231 Encounter for screening mammogram for malignant neoplasm of breast: Secondary | ICD-10-CM | POA: Diagnosis not present

## 2017-11-22 DIAGNOSIS — M8589 Other specified disorders of bone density and structure, multiple sites: Secondary | ICD-10-CM | POA: Diagnosis not present

## 2017-11-22 LAB — HM MAMMOGRAPHY

## 2017-11-22 LAB — HM DEXA SCAN

## 2017-11-23 LAB — HM DEXA SCAN

## 2017-11-24 ENCOUNTER — Inpatient Hospital Stay: Payer: Medicare Other

## 2017-11-24 ENCOUNTER — Other Ambulatory Visit: Payer: Self-pay | Admitting: Adult Health

## 2017-11-24 VITALS — BP 156/90 | HR 66 | Temp 97.8°F | Resp 20

## 2017-11-24 DIAGNOSIS — D469 Myelodysplastic syndrome, unspecified: Secondary | ICD-10-CM

## 2017-11-24 DIAGNOSIS — E039 Hypothyroidism, unspecified: Secondary | ICD-10-CM

## 2017-11-24 DIAGNOSIS — D649 Anemia, unspecified: Secondary | ICD-10-CM

## 2017-11-24 LAB — CBC WITH DIFFERENTIAL/PLATELET
BASOS PCT: 0 %
Basophils Absolute: 0 10*3/uL (ref 0.0–0.1)
EOS ABS: 0.1 10*3/uL (ref 0.0–0.5)
EOS PCT: 2 %
HCT: 32.9 % — ABNORMAL LOW (ref 34.8–46.6)
HEMOGLOBIN: 10.9 g/dL — AB (ref 11.6–15.9)
Lymphocytes Relative: 36 %
Lymphs Abs: 1.7 10*3/uL (ref 0.9–3.3)
MCH: 29.4 pg (ref 25.1–34.0)
MCHC: 33.1 g/dL (ref 31.5–36.0)
MCV: 88.7 fL (ref 79.5–101.0)
MONOS PCT: 10 %
Monocytes Absolute: 0.5 10*3/uL (ref 0.1–0.9)
NEUTROS PCT: 52 %
Neutro Abs: 2.5 10*3/uL (ref 1.5–6.5)
PLATELETS: 96 10*3/uL — AB (ref 145–400)
RBC: 3.71 MIL/uL (ref 3.70–5.45)
RDW: 15.9 % — ABNORMAL HIGH (ref 11.2–14.5)
WBC: 4.7 10*3/uL (ref 3.9–10.3)

## 2017-11-24 MED ORDER — DARBEPOETIN ALFA 300 MCG/0.6ML IJ SOSY
300.0000 ug | PREFILLED_SYRINGE | INTRAMUSCULAR | Status: DC
  Administered 2017-11-24: 300 ug via SUBCUTANEOUS

## 2017-11-24 MED ORDER — DARBEPOETIN ALFA 300 MCG/0.6ML IJ SOSY
PREFILLED_SYRINGE | INTRAMUSCULAR | Status: AC
  Filled 2017-11-24: qty 0.6

## 2017-11-24 NOTE — Patient Instructions (Signed)
Darbepoetin Alfa injection What is this medicine? DARBEPOETIN ALFA (dar be POE e tin AL fa) helps your body make more red blood cells. It is used to treat anemia caused by chronic kidney failure and chemotherapy. This medicine may be used for other purposes; ask your health care provider or pharmacist if you have questions. COMMON BRAND NAME(S): Aranesp What should I tell my health care provider before I take this medicine? They need to know if you have any of these conditions: -blood clotting disorders or history of blood clots -cancer patient not on chemotherapy -cystic fibrosis -heart disease, such as angina, heart failure, or a history of a heart attack -hemoglobin level of 12 g/dL or greater -high blood pressure -low levels of folate, iron, or vitamin B12 -seizures -an unusual or allergic reaction to darbepoetin, erythropoietin, albumin, hamster proteins, latex, other medicines, foods, dyes, or preservatives -pregnant or trying to get pregnant -breast-feeding How should I use this medicine? This medicine is for injection into a vein or under the skin. It is usually given by a health care professional in a hospital or clinic setting. If you get this medicine at home, you will be taught how to prepare and give this medicine. Do not shake the solution before you withdraw a dose. Use exactly as directed. Take your medicine at regular intervals. Do not take your medicine more often than directed. It is important that you put your used needles and syringes in a special sharps container. Do not put them in a trash can. If you do not have a sharps container, call your pharmacist or healthcare provider to get one. Talk to your pediatrician regarding the use of this medicine in children. While this medicine may be used in children as young as 1 year for selected conditions, precautions do apply. Overdosage: If you think you have taken too much of this medicine contact a poison control center or  emergency room at once. NOTE: This medicine is only for you. Do not share this medicine with others. What if I miss a dose? If you miss a dose, take it as soon as you can. If it is almost time for your next dose, take only that dose. Do not take double or extra doses. What may interact with this medicine? Do not take this medicine with any of the following medications: -epoetin alfa This list may not describe all possible interactions. Give your health care provider a list of all the medicines, herbs, non-prescription drugs, or dietary supplements you use. Also tell them if you smoke, drink alcohol, or use illegal drugs. Some items may interact with your medicine. What should I watch for while using this medicine? Visit your prescriber or health care professional for regular checks on your progress and for the needed blood tests and blood pressure measurements. It is especially important for the doctor to make sure your hemoglobin level is in the desired range, to limit the risk of potential side effects and to give you the best benefit. Keep all appointments for any recommended tests. Check your blood pressure as directed. Ask your doctor what your blood pressure should be and when you should contact him or her. As your body makes more red blood cells, you may need to take iron, folic acid, or vitamin B supplements. Ask your doctor or health care provider which products are right for you. If you have kidney disease continue dietary restrictions, even though this medication can make you feel better. Talk with your doctor or health   care professional about the foods you eat and the vitamins that you take. What side effects may I notice from receiving this medicine? Side effects that you should report to your doctor or health care professional as soon as possible: -allergic reactions like skin rash, itching or hives, swelling of the face, lips, or tongue -breathing problems -changes in vision -chest  pain -confusion, trouble speaking or understanding -feeling faint or lightheaded, falls -high blood pressure -muscle aches or pains -pain, swelling, warmth in the leg -rapid weight gain -severe headaches -sudden numbness or weakness of the face, arm or leg -trouble walking, dizziness, loss of balance or coordination -seizures (convulsions) -swelling of the ankles, feet, hands -unusually weak or tired Side effects that usually do not require medical attention (report to your doctor or health care professional if they continue or are bothersome): -diarrhea -fever, chills (flu-like symptoms) -headaches -nausea, vomiting -redness, stinging, or swelling at site where injected This list may not describe all possible side effects. Call your doctor for medical advice about side effects. You may report side effects to FDA at 1-800-FDA-1088. Where should I keep my medicine? Keep out of the reach of children. Store in a refrigerator between 2 and 8 degrees C (36 and 46 degrees F). Do not freeze. Do not shake. Throw away any unused portion if using a single-dose vial. Throw away any unused medicine after the expiration date. NOTE: This sheet is a summary. It may not cover all possible information. If you have questions about this medicine, talk to your doctor, pharmacist, or health care provider.  2015, [employer]. (2008-09-02 10:23:57)  

## 2017-11-28 DIAGNOSIS — R922 Inconclusive mammogram: Secondary | ICD-10-CM | POA: Diagnosis not present

## 2017-11-28 LAB — HM MAMMOGRAPHY

## 2017-12-15 ENCOUNTER — Inpatient Hospital Stay: Payer: Medicare Other | Attending: Oncology

## 2017-12-15 ENCOUNTER — Inpatient Hospital Stay: Payer: Medicare Other

## 2017-12-15 DIAGNOSIS — D509 Iron deficiency anemia, unspecified: Secondary | ICD-10-CM | POA: Insufficient documentation

## 2017-12-15 DIAGNOSIS — D469 Myelodysplastic syndrome, unspecified: Secondary | ICD-10-CM | POA: Diagnosis not present

## 2017-12-15 LAB — CBC WITH DIFFERENTIAL/PLATELET
BASOS ABS: 0 10*3/uL (ref 0.0–0.1)
BASOS PCT: 0 %
EOS PCT: 3 %
Eosinophils Absolute: 0.1 10*3/uL (ref 0.0–0.5)
HCT: 35.4 % (ref 34.8–46.6)
Hemoglobin: 11.8 g/dL (ref 11.6–15.9)
Lymphocytes Relative: 45 %
Lymphs Abs: 1.6 10*3/uL (ref 0.9–3.3)
MCH: 28.6 pg (ref 25.1–34.0)
MCHC: 33.3 g/dL (ref 31.5–36.0)
MCV: 85.7 fL (ref 79.5–101.0)
MONO ABS: 0.4 10*3/uL (ref 0.1–0.9)
Monocytes Relative: 12 %
Neutro Abs: 1.4 10*3/uL — ABNORMAL LOW (ref 1.5–6.5)
Neutrophils Relative %: 40 %
PLATELETS: 116 10*3/uL — AB (ref 145–400)
RBC: 4.13 MIL/uL (ref 3.70–5.45)
RDW: 15.9 % — AB (ref 11.2–14.5)
WBC: 3.5 10*3/uL — AB (ref 3.9–10.3)

## 2017-12-15 LAB — IRON AND TIBC
IRON: 78 ug/dL (ref 41–142)
SATURATION RATIOS: 29 % (ref 21–57)
TIBC: 269 ug/dL (ref 236–444)
UIBC: 191 ug/dL

## 2017-12-15 LAB — FERRITIN: Ferritin: 34 ng/mL (ref 9–269)

## 2017-12-15 NOTE — Progress Notes (Signed)
Hemoglobin today is 11.8. Injection does not meet parameters. Patient verbalized understanding.

## 2018-01-03 ENCOUNTER — Telehealth: Payer: Self-pay | Admitting: Internal Medicine

## 2018-01-03 NOTE — Telephone Encounter (Signed)
Patient called and requested referral to dermatology to evaluate "Susan [name]" size mole on left side of neck. mole has changed color, from light brown to pink. patient would like removed. Requesting [facility], in network prvdr, BCBSM Please advise

## 2018-01-05 ENCOUNTER — Inpatient Hospital Stay: Payer: Medicare Other

## 2018-01-05 ENCOUNTER — Inpatient Hospital Stay: Payer: Medicare Other | Attending: Oncology

## 2018-01-05 VITALS — BP 171/87 | HR 58 | Temp 97.9°F | Resp 18

## 2018-01-05 DIAGNOSIS — N183 Chronic kidney disease, stage 3 unspecified: Secondary | ICD-10-CM

## 2018-01-05 DIAGNOSIS — K219 Gastro-esophageal reflux disease without esophagitis: Secondary | ICD-10-CM

## 2018-01-05 DIAGNOSIS — D631 Anemia in chronic kidney disease: Secondary | ICD-10-CM | POA: Insufficient documentation

## 2018-01-05 DIAGNOSIS — Z0000 Encounter for general adult medical examination without abnormal findings: Secondary | ICD-10-CM

## 2018-01-05 DIAGNOSIS — K649 Unspecified hemorrhoids: Secondary | ICD-10-CM

## 2018-01-05 DIAGNOSIS — E782 Mixed hyperlipidemia: Secondary | ICD-10-CM

## 2018-01-05 DIAGNOSIS — D469 Myelodysplastic syndrome, unspecified: Secondary | ICD-10-CM

## 2018-01-05 DIAGNOSIS — I10 Essential (primary) hypertension: Secondary | ICD-10-CM

## 2018-01-05 DIAGNOSIS — E039 Hypothyroidism, unspecified: Secondary | ICD-10-CM

## 2018-01-05 DIAGNOSIS — Z79899 Other long term (current) drug therapy: Secondary | ICD-10-CM

## 2018-01-05 DIAGNOSIS — E559 Vitamin D deficiency, unspecified: Secondary | ICD-10-CM

## 2018-01-05 DIAGNOSIS — Z6830 Body mass index (BMI) 30.0-30.9, adult: Secondary | ICD-10-CM

## 2018-01-05 DIAGNOSIS — D649 Anemia, unspecified: Secondary | ICD-10-CM

## 2018-01-05 DIAGNOSIS — R7303 Prediabetes: Secondary | ICD-10-CM

## 2018-01-05 DIAGNOSIS — N189 Chronic kidney disease, unspecified: Principal | ICD-10-CM

## 2018-01-05 LAB — CBC WITH DIFFERENTIAL/PLATELET
BASOS ABS: 0 10*3/uL (ref 0.0–0.1)
BASOS PCT: 0 %
EOS ABS: 0.1 10*3/uL (ref 0.0–0.5)
EOS PCT: 3 %
HCT: 31.2 % — ABNORMAL LOW (ref 34.8–46.6)
Hemoglobin: 10.6 g/dL — ABNORMAL LOW (ref 11.6–15.9)
Lymphocytes Relative: 45 %
Lymphs Abs: 1.6 10*3/uL (ref 0.9–3.3)
MCH: 28.2 pg (ref 25.1–34.0)
MCHC: 34 g/dL (ref 31.5–36.0)
MCV: 83 fL (ref 79.5–101.0)
MONO ABS: 0.3 10*3/uL (ref 0.1–0.9)
Monocytes Relative: 9 %
Neutro Abs: 1.5 10*3/uL (ref 1.5–6.5)
Neutrophils Relative %: 43 %
PLATELETS: 145 10*3/uL (ref 145–400)
RBC: 3.76 MIL/uL (ref 3.70–5.45)
RDW: 15.4 % — AB (ref 11.2–14.5)
WBC: 3.5 10*3/uL — ABNORMAL LOW (ref 3.9–10.3)

## 2018-01-05 MED ORDER — DARBEPOETIN ALFA 300 MCG/0.6ML IJ SOSY
PREFILLED_SYRINGE | INTRAMUSCULAR | Status: AC
  Filled 2018-01-05: qty 0.6

## 2018-01-05 MED ORDER — DARBEPOETIN ALFA 300 MCG/0.6ML IJ SOSY
300.0000 ug | PREFILLED_SYRINGE | INTRAMUSCULAR | Status: DC
  Administered 2018-01-05: 300 ug via SUBCUTANEOUS

## 2018-01-05 NOTE — Patient Instructions (Signed)

## 2018-01-05 NOTE — Progress Notes (Signed)
Patient BP elevated.  Due for Aranesp 300.  Called on call MD for verification of treatment.  Dr. Alvy Bimler reviewed pt chart and gave ok to give injection according to MD notes.

## 2018-01-15 ENCOUNTER — Other Ambulatory Visit: Payer: Self-pay | Admitting: Internal Medicine

## 2018-01-15 MED ORDER — PROMETHAZINE-DM 6.25-15 MG/5ML PO SYRP: ORAL_SOLUTION | ORAL | 1 refills | Status: DC

## 2018-01-15 MED ORDER — AZITHROMYCIN 250 MG PO TABS: ORAL_TABLET | ORAL | 1 refills | Status: DC

## 2018-01-17 ENCOUNTER — Other Ambulatory Visit: Payer: Self-pay | Admitting: Internal Medicine

## 2018-01-17 MED ORDER — PHENYLEPHRINE-PYRILAMINE-DM 5-8.33-10 MG/5ML PO SYRP: ORAL_SOLUTION | ORAL | 0 refills | Status: DC

## 2018-01-18 ENCOUNTER — Telehealth: Payer: Self-pay | Admitting: *Deleted

## 2018-01-18 NOTE — Telephone Encounter (Signed)
Patient called and states the pharmacy does not have cough syrups sent in by Dr Melford Aase.  Per Dr Melford Aase, the patient can try OTC Delsym or Dayquil/Nyquil. Patient is aware.

## 2018-01-26 ENCOUNTER — Inpatient Hospital Stay: Payer: Medicare Other

## 2018-01-26 VITALS — BP 150/77 | HR 61 | Temp 97.8°F | Resp 18

## 2018-01-26 DIAGNOSIS — D508 Other iron deficiency anemias: Secondary | ICD-10-CM

## 2018-01-26 DIAGNOSIS — D469 Myelodysplastic syndrome, unspecified: Secondary | ICD-10-CM

## 2018-01-26 DIAGNOSIS — D631 Anemia in chronic kidney disease: Secondary | ICD-10-CM | POA: Diagnosis not present

## 2018-01-26 DIAGNOSIS — N183 Chronic kidney disease, stage 3 (moderate): Secondary | ICD-10-CM | POA: Diagnosis not present

## 2018-01-26 LAB — CBC WITH DIFFERENTIAL/PLATELET
BASOS ABS: 0 10*3/uL (ref 0.0–0.1)
BASOS PCT: 0 %
EOS ABS: 0.1 10*3/uL (ref 0.0–0.5)
EOS PCT: 2 %
HCT: 31.4 % — ABNORMAL LOW (ref 34.8–46.6)
Hemoglobin: 10.6 g/dL — ABNORMAL LOW (ref 11.6–15.9)
Lymphocytes Relative: 42 %
Lymphs Abs: 1.8 10*3/uL (ref 0.9–3.3)
MCH: 28.4 pg (ref 25.1–34.0)
MCHC: 33.8 g/dL (ref 31.5–36.0)
MCV: 84.1 fL (ref 79.5–101.0)
MONO ABS: 0.3 10*3/uL (ref 0.1–0.9)
Monocytes Relative: 7 %
Neutro Abs: 2.2 10*3/uL (ref 1.5–6.5)
Neutrophils Relative %: 49 %
PLATELETS: 109 10*3/uL — AB (ref 145–400)
RBC: 3.74 MIL/uL (ref 3.70–5.45)
RDW: 17.1 % — AB (ref 11.2–14.5)
WBC: 4.5 10*3/uL (ref 3.9–10.3)

## 2018-01-26 MED ORDER — DARBEPOETIN ALFA 300 MCG/0.6ML IJ SOSY
PREFILLED_SYRINGE | INTRAMUSCULAR | Status: AC
  Filled 2018-01-26: qty 0.6

## 2018-01-26 MED ORDER — DARBEPOETIN ALFA 300 MCG/0.6ML IJ SOSY
300.0000 ug | PREFILLED_SYRINGE | INTRAMUSCULAR | Status: DC
  Administered 2018-01-26: 300 ug via SUBCUTANEOUS

## 2018-01-30 ENCOUNTER — Encounter: Payer: Self-pay | Admitting: Internal Medicine

## 2018-02-01 ENCOUNTER — Other Ambulatory Visit: Payer: Self-pay | Admitting: Internal Medicine

## 2018-02-01 DIAGNOSIS — I10 Essential (primary) hypertension: Secondary | ICD-10-CM

## 2018-02-16 ENCOUNTER — Inpatient Hospital Stay: Payer: Medicare Other | Attending: Oncology

## 2018-02-16 ENCOUNTER — Inpatient Hospital Stay: Payer: Medicare Other

## 2018-02-16 DIAGNOSIS — N183 Chronic kidney disease, stage 3 (moderate): Secondary | ICD-10-CM | POA: Insufficient documentation

## 2018-02-16 DIAGNOSIS — D631 Anemia in chronic kidney disease: Secondary | ICD-10-CM | POA: Insufficient documentation

## 2018-02-16 DIAGNOSIS — D469 Myelodysplastic syndrome, unspecified: Secondary | ICD-10-CM

## 2018-02-16 LAB — CBC WITH DIFFERENTIAL/PLATELET
Basophils Absolute: 0 10*3/uL (ref 0.0–0.1)
Basophils Relative: 1 %
Eosinophils Absolute: 0.1 10*3/uL (ref 0.0–0.5)
Eosinophils Relative: 3 %
HEMATOCRIT: 33.5 % — AB (ref 34.8–46.6)
HEMOGLOBIN: 11.2 g/dL — AB (ref 11.6–15.9)
LYMPHS ABS: 1.6 10*3/uL (ref 0.9–3.3)
LYMPHS PCT: 43 %
MCH: 28.5 pg (ref 25.1–34.0)
MCHC: 33.4 g/dL (ref 31.5–36.0)
MCV: 85.1 fL (ref 79.5–101.0)
Monocytes Absolute: 0.4 10*3/uL (ref 0.1–0.9)
Monocytes Relative: 11 %
NEUTROS ABS: 1.6 10*3/uL (ref 1.5–6.5)
NEUTROS PCT: 42 %
Platelets: 131 10*3/uL — ABNORMAL LOW (ref 145–400)
RBC: 3.94 MIL/uL (ref 3.70–5.45)
RDW: 17.2 % — ABNORMAL HIGH (ref 11.2–14.5)
WBC: 3.7 10*3/uL — ABNORMAL LOW (ref 3.9–10.3)

## 2018-02-16 MED ORDER — DARBEPOETIN ALFA 300 MCG/0.6ML IJ SOSY
PREFILLED_SYRINGE | INTRAMUSCULAR | Status: AC
  Filled 2018-02-16: qty 0.6

## 2018-02-16 NOTE — Progress Notes (Signed)
Hemoglobin noted at 11.2 today. No injection needed per MD orders. Pt given current copy of labs and schedule. Instructed to follow schedule and call our center should issues occur.

## 2018-02-20 NOTE — Progress Notes (Signed)
MEDICARE ANNUAL WELLNESS VISIT AND FOLLOW UP  Assessment:   Diagnoses and all orders for this visit:  Encounter for Medicare annual wellness exam  Hemorrhoids, unspecified hemorrhoid type Improved after surgical correction Bowel management discussed  Essential hypertension Continue medications Monitor blood pressure at home; call if consistently over 130/80 Continue DASH diet.   Reminder to go to the ER if any CP, SOB, nausea, dizziness, severe HA, changes vision/speech, left arm numbness and tingling and jaw pain.  Gastroesophageal reflux disease, esophagitis presence not specified Well managed on current medications Discussed diet, avoiding triggers and other lifestyle changes  Hypothyroidism, unspecified type continue medications the same pending lab results reminded to take on an empty stomach 30-46mins before food.  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   Anemia of renal disease CBC, continue follow up with Dr. Alen Blew and Kyra Searles   Vitamin D deficiency Continue supplementation Check vitamin D level  Medication management CBC, CMP/GFR  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[place]) Continue follow up with Dr. Alen Blew  Hyperlipidemia At goal by lifestyle modification Continue low cholesterol diet and exercise.  Check lipid panel.   BMI 29.0-29.9,adult Long discussion about weight loss, diet, and exercise Recommended diet heavy in fruits and veggies and low in animal meats, cheeses, and dairy products, appropriate calorie intake Discussed appropriate weight for height  Follow up at next visit    Over 40 minutes of exam, counseling, chart review and critical decision making was performed Future Appointments  Date Time Provider [place]  03/09/2018  8:00 AM [facility] LAB 2 [facility] None  03/09/2018  8:45 AM [facility] INJ NURSE [facility] None   03/30/2018  8:00 AM [facility] LAB 3 [facility] None  03/30/2018  8:45 AM [facility] INJ NURSE [facility] None  04/20/2018  8:00 AM [facility] LAB 4 [facility] None  04/20/2018  8:45 AM [facility] INJ NURSE [facility] None  05/03/2018  8:30 AM [facility] LAB 1 [facility] None  05/03/2018  9:00 AM Shadad, Mathis Dad, MD [facility] None  05/03/2018  9:45 AM [facility] INJ NURSE [facility] None  05/25/2018  8:00 AM [facility] LAB 6 [facility] None  05/25/2018  8:45 AM [facility] INJ NURSE [facility] None  05/28/2018  9:30 AM Unk Pinto, MD [facility] None  11/20/2018  2:00 PM Unk Pinto, MD [facility] None     Plan:   During the course of the visit the patient was educated and counseled about appropriate screening and preventive services including:    Pneumococcal vaccine   Prevnar 13  Influenza vaccine  Td vaccine  Screening electrocardiogram  Bone densitometry screening  Colorectal cancer screening  Diabetes screening  Glaucoma screening  Nutrition counseling   Advanced directives: requested   Subjective:  Susan [name] is a 77 y.o. female who presents for Medicare Annual Wellness Visit and 3 month follow up. Patient is followed by Dr Alen Blew for CKD 3/4 Renal Disease. Patient is followed at 3 weekly intervals for CBC and she may receive Aranesp inj to maintain Hgb of 11.0 gm%  BMI is Body mass index is 29.7 kg/m., she has been working on diet and exercise. Wt Readings from Last 3 Encounters:  02/22/18 165 lb (74.8 kg)  11/07/17 163 lb (73.9 kg)  11/03/17 163 lb 4.8 oz (74.1 kg)    Her blood pressure has been controlled at home, today their BP is BP: 136/82 She does workout. She denies chest pain, shortness of breath, dizziness.  She is not on cholesterol medication and denies myalgias. Her cholesterol is at goal. The cholesterol last visit was:   Lab Results  Component Value Date   CHOL 189 11/07/2017   HDL 67 11/07/2017   LDLCALC 101 (H)  11/07/2017   TRIG 116 11/07/2017   CHOLHDL 2.8 11/07/2017    She has been working on diet and exercise for glucose managmeent, and denies foot ulcerations, increased appetite, nausea, paresthesia of the feet, polydipsia, polyuria, visual disturbances, vomiting and weight loss. Last A1C in the office was:  Lab Results  Component Value Date   HGBA1C 4.6 11/07/2017   She is on thyroid medication. Her medication was not changed last visit.   Lab Results  Component Value Date   TSH 3.13 11/07/2017   Last GFR: Lab Results  Component Value Date   GFRAA 38 (L) 11/07/2017   Patient is on Vitamin D supplement.   Lab Results  Component Value Date   VD25OH 48 11/07/2017      Medication Review: Current Outpatient Medications on File Prior to Visit  Medication Sig Dispense Refill  . acetaminophen (TYLENOL) 500 MG tablet Take 500-1,000 mg by mouth every 6 (six) hours as needed (for pain.).    [name] amLODipine (NORVASC) 10 MG tablet TAKE 1/2 TABLET BY MOUTH DAILY AT BEDTIME; (TAKE 1 TABLET IF SBP IS >150) 90 tablet 0  . aspirin 81 MG chewable tablet Chew 81 mg by mouth daily.     [name] azithromycin (ZITHROMAX) 250 MG tablet Take 2 tablets (500 mg) on  Day 1,  followed by 1 tablet (250 mg) once daily on Days 2 through 5. 6 each 1  . Cholecalciferol (VITAMIN D-3) 5000 UNITS TABS Take 15,000 Units by mouth daily.     . Darbepoetin Alfa (ARANESP, ALBUMIN FREE,) 300 MCG/ML SOLN Inject 300 mcg into the skin See admin instructions. USE INJECTION EVERY 3 WEEKS AS NEEDED FOR HEMOGLOBIN LESS THAN 11.    . diphenhydrAMINE (BENADRYL) 25 mg capsule One capsule every 6 hours x 3 days, then as needed for symptoms of rash/itching 30 capsule 0  . hydroxypropyl methylcellulose / hypromellose (ISOPTO TEARS / GONIOVISC) 2.5 % ophthalmic solution Place 1-2 drops into both eyes 3 (three) times daily as needed for dry eyes.    [name] labetalol (NORMODYNE) 300 MG tablet TAKE 1 TABLET BY MOUTH TWICE DAILY 180 tablet 0  .  levothyroxine (SYNTHROID, LEVOTHROID) 100 MCG tablet TAKE 1 TABLET BY MOUTH DAILY ON AN EMPTY STOMACH FOR 30 MINUTES 90 tablet 1  . losartan-hydrochlorothiazide (HYZAAR) 100-25 MG tablet Take 1 tablet by mouth daily. 90 tablet 0  . Magnesium 500 MG TABS Take 500 mg by mouth at bedtime as needed (leg cramps).    . promethazine-dextromethorphan (PROMETHAZINE-DM) 6.25-15 MG/5ML syrup Take 1 to 2 tsp enery 4 hours if needed for cough 360 mL 1  . pyrilamine-phenylephrine-dextromethorphan (CODIMAL DM) 5-8.33-10 MG/5ML syrup Take 1 to 2 teaspoons every 4 hours if needed for severe cough 240 mL 0  . ranitidine (ZANTAC) 300 MG tablet Take 1 to 2 tablets daily as needed for heartburn & reflux 180 tablet 3   No current facility-administered medications on file prior to visit.     Allergies  Allergen Reactions  . Codeine Anaphylaxis  . Penicillins Anaphylaxis  . Bactrim Itching and Swelling  . Clarithromycin Other (See Comments)    "Caused skin to peel off my hand."  . Flagyl [Metronidazole Hcl] Itching and Swelling  . Food Other (See Comments)  EATS WHITE RICE ONLY (ALLERGIC REACTION TO YELLOW OR BROWN RICE THAT RESULTED IN HOSPITALIZATION)  . Pineapple Itching and Swelling    SWELLING OF EYES  . Sulfa Antibiotics Itching and Swelling    Current Problems (verified) Patient Active Problem List   Diagnosis Date Noted  . 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 BMI 29.0-29.9,adult 09/02/2015  . Encounter for Medicare annual wellness exam 06/02/2015  . 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[place]) 11/03/2014  . CKD, 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) 11/02/2014  . Medication management 01/27/2014  . Hyperlipidemia 08/27/2013  . Essential hypertension   . Hypothyroidism   . GERD (gastroesophageal reflux disease)   . Vitamin D deficiency   . Hemorrhoids 07/15/2013    Screening Tests Immunization History  Administered Date(s) Administered  . Td 10/03/2005   Preventative care: Last colonoscopy: 2018 Last  mammogram: Just had in 11/2017 [facility] - report requested Last pap smear/pelvic exam: remote  DEXA: 2019 - just had CXR 2016 CT head 03/2016   Prior vaccinations: TD or Tdap: 2007, declines today  Influenza: declines Pneumococcal: declines Prevnar13: declines Shingles/Zostavax: declines  Names of Other Physician/Practitioners you currently use: 1. [facility]- here for primary care 2. Dr. Herbert Deaner, eye doctor, last visit Sept 2016, wears glasses, is following up soon  3. None, dentist, last visit 2017, has appointment in June  Patient Care Team: Unk Pinto, MD as PCP - General (Internal Medicine) Wyatt Portela, MD (Hematology and Oncology) Inda Castle, MD (Inactive) as Consulting Physician (Gastroenterology) Malena Catholic, MD as Referring Physician Monna Fam, MD as Consulting Physician (Ophthalmology)  SURGICAL HISTORY She  has a past surgical history that includes Umbilical hernia repair; Rotator cuff repair (Left); Inguinal hernia repair; Carpal tunnel release (Right); Dilation and curettage of uterus; and Transanal hemorrhoidal dearterialization (N/A, 03/31/2017). FAMILY HISTORY Her family history includes Bladder Cancer in her sister; Coronary artery disease in her brother; Hypertension in her father and sister; Other in her mother; Stroke in her father and sister. SOCIAL HISTORY She  reports that she quit smoking about 21 years ago. Her smoking use included cigarettes. She has never used smokeless tobacco. She reports that she drinks alcohol. She reports that she does not use drugs.   MEDICARE WELLNESS OBJECTIVES: Physical activity: Current Exercise Habits: Home exercise routine, Type of exercise: walking;strength training/weights, Time (Minutes): 40, Frequency (Times/Week): 4, Weekly Exercise (Minutes/Week): 160, Intensity: Mild, Exercise limited by: None identified Cardiac risk factors: Cardiac Risk Factors include:  advanced age (>39men, >49 women);dyslipidemia;hypertension Depression/mood screen:   Depression screen [facility] 2/9 02/22/2018  Decreased Interest 0  Down, Depressed, Hopeless 0  PHQ - 2 Score 0  Some recent data might be hidden    ADLs:  In your present state of health, do you have any difficulty performing the following activities: 02/22/2018 11/07/2017  Hearing? N N  Vision? N N  Difficulty concentrating or making decisions? N N  Comment - -  Walking or climbing stairs? N N  Dressing or bathing? N N  Doing errands, shopping? N N  Some recent data might be hidden     Cognitive Testing  Alert? Yes  Normal Appearance?Yes  Oriented to person? Yes  Place? Yes   Time? Yes  Recall of three objects?  Yes  Can perform simple calculations? Yes  Displays appropriate judgment?Yes  Can read the correct time from a watch face?Yes  EOL planning: Does Patient Have a Medical Advance Directive?: No Would patient like information on creating  a medical advance directive?: Yes (MAU/Ambulatory/Procedural Areas - Information given)  Review of Systems  Constitutional: Negative for malaise/fatigue and weight loss.  HENT: Negative for hearing loss and tinnitus.   Eyes: Negative for blurred vision and double vision.  Respiratory: Negative for cough, sputum production, shortness of breath and wheezing.   Cardiovascular: Negative for chest pain, palpitations, orthopnea, claudication, leg swelling and PND.  Gastrointestinal: Negative for abdominal pain, blood in stool, constipation, diarrhea, heartburn, melena, nausea and vomiting.  Genitourinary: Negative.   Musculoskeletal: Negative for falls, joint pain and myalgias.  Skin: Negative for rash.  Neurological: Negative for dizziness, tingling, sensory change, weakness and headaches.  Endo/Heme/Allergies: Negative for polydipsia.  Psychiatric/Behavioral: Negative.  Negative for depression, memory loss, substance abuse and suicidal ideas. The patient is not  nervous/anxious and does not have insomnia.   All other systems reviewed and are negative.    Objective:     Today's Vitals   02/22/18 0845  BP: 136/82  Pulse: 64  Temp: (!) 97.5 F (36.4 C)  SpO2: 99%  Weight: 165 lb (74.8 kg)  Height: 5' 2.5" (1.588 m)   Body mass index is 29.7 kg/m.  General appearance: alert, no distress, WD/WN, female HEENT: normocephalic, sclerae anicteric, TMs pearly, nares patent, no discharge or erythema, pharynx normal Oral cavity: MMM, no lesions Neck: supple, no lymphadenopathy, no thyromegaly, no masses Heart: RRR, normal S1, S2, no murmurs Lungs: CTA bilaterally, no wheezes, rhonchi, or rales Abdomen: +bs, soft, non tender, non distended, no masses, no hepatomegaly, no splenomegaly Musculoskeletal: nontender, no swelling, no obvious deformity Extremities: no edema, no cyanosis, no clubbing Pulses: 2+ symmetric, upper and lower extremities, normal cap refill Neurological: alert, oriented x 3, CN2-12 intact, strength normal upper extremities and lower extremities, sensation normal throughout, DTRs 2+ throughout, no cerebellar signs, gait normal Psychiatric: normal affect, behavior normal, pleasant   Medicare Attestation I have personally reviewed: The patient's medical and social history Their use of alcohol, tobacco or illicit drugs Their current medications and supplements The patient's functional ability including ADLs,fall risks, home safety risks, cognitive, and hearing and visual impairment Diet and physical activities Evidence for depression or mood disorders  The patient's weight, height, BMI, and visual acuity have been recorded in the chart.  I have made referrals, counseling, and provided education to the patient based on review of the above and I have provided the patient with a written personalized care plan for preventive services.     Izora Ribas, NP   02/22/2018

## 2018-02-22 ENCOUNTER — Encounter: Payer: Self-pay | Admitting: Adult Health

## 2018-02-22 ENCOUNTER — Ambulatory Visit (INDEPENDENT_AMBULATORY_CARE_PROVIDER_SITE_OTHER): Payer: Medicare Other | Admitting: Adult Health

## 2018-02-22 VITALS — BP 136/82 | HR 64 | Temp 97.5°F | Ht 62.5 in | Wt 165.0 lb

## 2018-02-22 DIAGNOSIS — Z6829 Body mass index (BMI) 29.0-29.9, adult: Secondary | ICD-10-CM | POA: Diagnosis not present

## 2018-02-22 DIAGNOSIS — E782 Mixed hyperlipidemia: Secondary | ICD-10-CM

## 2018-02-22 DIAGNOSIS — K219 Gastro-esophageal reflux disease without esophagitis: Secondary | ICD-10-CM

## 2018-02-22 DIAGNOSIS — Z0000 Encounter for general adult medical examination without abnormal findings: Secondary | ICD-10-CM

## 2018-02-22 DIAGNOSIS — R6889 Other general symptoms and signs: Secondary | ICD-10-CM | POA: Diagnosis not present

## 2018-02-22 DIAGNOSIS — D631 Anemia in chronic kidney disease: Secondary | ICD-10-CM | POA: Diagnosis not present

## 2018-02-22 DIAGNOSIS — Z0001 Encounter for general adult medical examination with abnormal findings: Secondary | ICD-10-CM

## 2018-02-22 DIAGNOSIS — N183 Chronic kidney disease, stage 3 unspecified: Secondary | ICD-10-CM

## 2018-02-22 DIAGNOSIS — E559 Vitamin D deficiency, unspecified: Secondary | ICD-10-CM

## 2018-02-22 DIAGNOSIS — Z79899 Other long term (current) drug therapy: Secondary | ICD-10-CM

## 2018-02-22 DIAGNOSIS — Z23 Encounter for immunization: Secondary | ICD-10-CM

## 2018-02-22 DIAGNOSIS — N189 Chronic kidney disease, unspecified: Secondary | ICD-10-CM | POA: Diagnosis not present

## 2018-02-22 DIAGNOSIS — D469 Myelodysplastic syndrome, unspecified: Secondary | ICD-10-CM | POA: Diagnosis not present

## 2018-02-22 DIAGNOSIS — K649 Unspecified hemorrhoids: Secondary | ICD-10-CM

## 2018-02-22 DIAGNOSIS — I10 Essential (primary) hypertension: Secondary | ICD-10-CM

## 2018-02-22 DIAGNOSIS — E039 Hypothyroidism, unspecified: Secondary | ICD-10-CM | POA: Diagnosis not present

## 2018-02-22 NOTE — Patient Instructions (Signed)
Add jumping jacks/ jump rope to daily routine for bone health    Aim for 7+ servings of fruits and vegetables daily  80+ fluid ounces of water or unsweet tea for healthy kidneys  Limit alcohol intake   Limit animal fats in diet for cholesterol and heart health - choose grass fed whenever available  Aim for low stress - take time to unwind and care for your mental health  Aim for 150 min of moderate intensity exercise weekly for heart health, and weights twice weekly for bone health  Aim for 7-9 hours of sleep daily     Preventing Osteoporosis, Adult Osteoporosis is a condition that causes the bones to get weaker. With osteoporosis, the bones become thinner, and the normal spaces in bone tissue become larger. This can make the bones weak and cause them to break more easily. People who have osteoporosis are more likely to break their wrist, spine, or hip. Even a minor accident or injury can be enough to break weak bones. Osteoporosis can occur with aging. Your body constantly replaces old bone tissue with new tissue. As you get older, you may lose bone tissue more quickly, or it may be replaced more slowly. Osteoporosis is more likely to develop if you have poor nutrition or do not get enough calcium or vitamin D. Other lifestyle factors can also play a role. By making some diet and lifestyle changes, you can help to keep your bones healthy and help to prevent osteoporosis. What nutrition changes can be made? Nutrition plays an important role in maintaining healthy, strong bones.  Make sure you get enough calcium every day from food or from calcium supplements. ? If you are age 40 or younger, aim to get 1,000 mg of calcium every day. ? If you are older than age 79, aim to get 1,200 mg of calcium every day.  Try to get enough vitamin D every day. ? If you are age 79 or younger, aim to get 600 international units (IU) every day. ? If you are older than age 79, aim to get 800  international units every day.  Follow a healthy diet. Eat plenty of foods that contain calcium and vitamin D. ? Calcium is in milk, cheese, yogurt, and other dairy products. Some fish and vegetables are also good sources of calcium. Many foods such as cereals and breads have had calcium added to them (are fortified). Check nutrition labels to see how much calcium is in a food or drink. ? Foods that contain vitamin D include milk, cereals, salmon, and tuna. Your body also makes vitamin D when you are out in the sun. Bare skin exposure to the sun on your face, arms, legs, or back for no more than 30 minutes a day, 2 times per week is more than enough. Beyond that, it is important to use sunblock to protect your skin from sunburn, which increases your risk for skin cancer.  What lifestyle changes can be made? Making changes in your everyday life can also play an important role in preventing osteoporosis.  Stay active and get exercise every day. Ask your health care provider what types of exercise are best for you.  Do not use any products that contain nicotine or tobacco, such as cigarettes and e-cigarettes. If you need help quitting, ask your health care provider.  Limit alcohol intake to no more than 1 drink a day for nonpregnant women and 2 drinks a day for men. One drink equals 12 oz of  beer, 5 oz of wine, or 1 oz of hard liquor.  Why are these changes important? Making these nutrition and lifestyle changes can:  Help you develop and maintain healthy, strong bones.  Prevent loss of bone mass and the problems that are caused by that loss, such as broken bones and delayed healing.  Make you feel better mentally and physically.  What can happen if changes are not made? Problems that can result from osteoporosis can be very serious. These may include:  A higher risk of broken bones that are painful and do not heal well.  Physical malformations, such as a collapsed spine or a hunched  back.  Problems with movement.  Where to find support: If you need help making changes to prevent osteoporosis, talk with your health care provider. You can ask for a referral to a diet and nutrition specialist (dietitian) and a physical therapist. Where to find more information: Learn more about osteoporosis from:  NIH Osteoporosis and Related [place]: www.niams.GolfingGoddess.com.br  U.S. Office on Women's Health: SouvenirBaseball.es.html  National Osteoporosis Foundation: ProfilePeek.ch  Summary  Osteoporosis is a condition that causes weak bones that are more likely to break.  Eating a healthy diet and making sure you get enough calcium and vitamin D can help prevent osteoporosis.  Other ways to reduce your risk of osteoporosis include getting regular exercise and avoiding alcohol and products that contain nicotine or tobacco. This information is not intended to replace advice given to you by your health care provider. Make sure you discuss any questions you have with your health care provider. Document Released: 10/04/2015 Document Revised: 05/30/2016 Document Reviewed: 05/30/2016 Elsevier Interactive Patient Education  [employer].

## 2018-02-23 LAB — COMPLETE METABOLIC PANEL WITH GFR
AG RATIO: 1.5 (calc) (ref 1.0–2.5)
ALBUMIN MSPROF: 4.5 g/dL (ref 3.6–5.1)
ALKALINE PHOSPHATASE (APISO): 89 U/L (ref 33–130)
ALT: 10 U/L (ref 6–29)
AST: 20 U/L (ref 10–35)
BILIRUBIN TOTAL: 0.7 mg/dL (ref 0.2–1.2)
BUN/Creatinine Ratio: 21 (calc) (ref 6–22)
BUN: 32 mg/dL — AB (ref 7–25)
CHLORIDE: 107 mmol/L (ref 98–110)
CO2: 26 mmol/L (ref 20–32)
Calcium: 9.9 mg/dL (ref 8.6–10.4)
Creat: 1.51 mg/dL — ABNORMAL HIGH (ref 0.60–0.93)
GFR, Est African American: 38 mL/min/{1.73_m2} — ABNORMAL LOW (ref >=60)
GFR, Est Non African American: 33 mL/min/{1.73_m2} — ABNORMAL LOW (ref >=60)
GLUCOSE: 91 mg/dL (ref 65–99)
Globulin: 3 g/dL (calc) (ref 1.9–3.7)
Potassium: 4.5 mmol/L (ref 3.5–5.3)
Sodium: 141 mmol/L (ref 135–146)
Total Protein: 7.5 g/dL (ref 6.1–8.1)

## 2018-02-23 LAB — CBC WITH DIFFERENTIAL/PLATELET
BASOS PCT: 0.5 %
Basophils Absolute: 20 cells/uL (ref 0–200)
EOS PCT: 2 %
Eosinophils Absolute: 78 cells/uL (ref 15–500)
HEMATOCRIT: 33.4 % — AB (ref 35.0–45.0)
Hemoglobin: 11 g/dL — ABNORMAL LOW (ref 11.7–15.5)
LYMPHS ABS: 1661 {cells}/uL (ref 850–3900)
MCH: 28.6 pg (ref 27.0–33.0)
MCHC: 32.9 g/dL (ref 32.0–36.0)
MCV: 87 fL (ref 80.0–100.0)
Monocytes Relative: 9.9 %
NEUTROS PCT: 45 %
Neutro Abs: 1755 cells/uL (ref 1500–7800)
Platelets: 103 10*3/uL — ABNORMAL LOW (ref 140–400)
RBC: 3.84 10*6/uL (ref 3.80–5.10)
RDW: 15.6 % — AB (ref 11.0–15.0)
Total Lymphocyte: 42.6 %
WBC mixed population: 386 cells/uL (ref 200–950)
WBC: 3.9 10*3/uL (ref 3.8–10.8)

## 2018-02-23 LAB — LIPID PANEL
CHOLESTEROL: 191 mg/dL (ref <200)
HDL: 65 mg/dL (ref >50)
LDL CHOLESTEROL (CALC): 106 mg/dL — AB
Non-HDL Cholesterol (Calc): 126 mg/dL (calc) (ref <130)
TRIGLYCERIDES: 106 mg/dL (ref <150)
Total CHOL/HDL Ratio: 2.9 (calc) (ref <5.0)

## 2018-02-23 LAB — TSH: TSH: 2.54 mIU/L (ref 0.40–4.50)

## 2018-02-23 LAB — MAGNESIUM: Magnesium: 2 mg/dL (ref 1.5–2.5)

## 2018-02-23 LAB — VITAMIN D 25 HYDROXY (VIT D DEFICIENCY, FRACTURES): Vit D, 25-Hydroxy: 60 ng/mL (ref 30–100)

## 2018-03-01 ENCOUNTER — Encounter: Payer: Self-pay | Admitting: *Deleted

## 2018-03-07 ENCOUNTER — Telehealth: Payer: Self-pay | Admitting: Oncology

## 2018-03-07 NOTE — Telephone Encounter (Signed)
Patient called to reschedule  °

## 2018-03-09 ENCOUNTER — Ambulatory Visit: Payer: Medicare Other

## 2018-03-09 ENCOUNTER — Inpatient Hospital Stay: Payer: Medicare Other

## 2018-03-09 ENCOUNTER — Inpatient Hospital Stay: Payer: Medicare Other | Attending: Oncology

## 2018-03-09 ENCOUNTER — Other Ambulatory Visit: Payer: Medicare Other

## 2018-03-09 VITALS — BP 158/71 | HR 63 | Resp 20

## 2018-03-09 DIAGNOSIS — Z961 Presence of intraocular lens: Secondary | ICD-10-CM | POA: Diagnosis not present

## 2018-03-09 DIAGNOSIS — D631 Anemia in chronic kidney disease: Secondary | ICD-10-CM | POA: Insufficient documentation

## 2018-03-09 DIAGNOSIS — N189 Chronic kidney disease, unspecified: Secondary | ICD-10-CM | POA: Insufficient documentation

## 2018-03-09 DIAGNOSIS — D500 Iron deficiency anemia secondary to blood loss (chronic): Secondary | ICD-10-CM

## 2018-03-09 DIAGNOSIS — H35363 Drusen (degenerative) of macula, bilateral: Secondary | ICD-10-CM | POA: Diagnosis not present

## 2018-03-09 DIAGNOSIS — D469 Myelodysplastic syndrome, unspecified: Secondary | ICD-10-CM

## 2018-03-09 DIAGNOSIS — H35033 Hypertensive retinopathy, bilateral: Secondary | ICD-10-CM | POA: Diagnosis not present

## 2018-03-09 DIAGNOSIS — H40013 Open angle with borderline findings, low risk, bilateral: Secondary | ICD-10-CM | POA: Diagnosis not present

## 2018-03-09 LAB — CBC WITH DIFFERENTIAL/PLATELET
BASOS ABS: 0 10*3/uL (ref 0.0–0.1)
Basophils Relative: 0 %
EOS PCT: 2 %
Eosinophils Absolute: 0.1 10*3/uL (ref 0.0–0.5)
HCT: 29.2 % — ABNORMAL LOW (ref 34.8–46.6)
Hemoglobin: 9.8 g/dL — ABNORMAL LOW (ref 11.6–15.9)
LYMPHS PCT: 49 %
Lymphs Abs: 2 10*3/uL (ref 0.9–3.3)
MCH: 27.8 pg (ref 25.1–34.0)
MCHC: 33.6 g/dL (ref 31.5–36.0)
MCV: 83 fL (ref 79.5–101.0)
Monocytes Absolute: 0.4 10*3/uL (ref 0.1–0.9)
Monocytes Relative: 9 %
NEUTROS ABS: 1.7 10*3/uL (ref 1.5–6.5)
Neutrophils Relative %: 40 %
PLATELETS: 134 10*3/uL — AB (ref 145–400)
RBC: 3.52 MIL/uL — ABNORMAL LOW (ref 3.70–5.45)
RDW: 15.5 % — ABNORMAL HIGH (ref 11.2–14.5)
WBC: 4.2 10*3/uL (ref 3.9–10.3)

## 2018-03-09 MED ORDER — DARBEPOETIN ALFA 300 MCG/0.6ML IJ SOSY
PREFILLED_SYRINGE | INTRAMUSCULAR | Status: AC
  Filled 2018-03-09: qty 0.6

## 2018-03-09 MED ORDER — DARBEPOETIN ALFA 300 MCG/0.6ML IJ SOSY
300.0000 ug | PREFILLED_SYRINGE | INTRAMUSCULAR | Status: DC
  Administered 2018-03-09: 300 ug via SUBCUTANEOUS

## 2018-03-22 ENCOUNTER — Encounter: Payer: Self-pay | Admitting: Adult Health

## 2018-03-22 ENCOUNTER — Ambulatory Visit (INDEPENDENT_AMBULATORY_CARE_PROVIDER_SITE_OTHER): Payer: Medicare Other | Admitting: Adult Health

## 2018-03-22 VITALS — BP 136/82 | HR 64 | Temp 97.7°F | Ht 62.5 in | Wt 163.8 lb

## 2018-03-22 DIAGNOSIS — R6884 Jaw pain: Secondary | ICD-10-CM

## 2018-03-22 DIAGNOSIS — R220 Localized swelling, mass and lump, head: Secondary | ICD-10-CM

## 2018-03-22 MED ORDER — HYDROCODONE-ACETAMINOPHEN 5-325 MG PO TABS: 1.0000 | ORAL_TABLET | Freq: Four times a day (QID) | ORAL | 0 refills | Status: DC | PRN

## 2018-03-22 MED ORDER — DOXYCYCLINE HYCLATE 100 MG PO TABS: 100.0000 mg | ORAL_TABLET | Freq: Two times a day (BID) | ORAL | 0 refills | Status: AC

## 2018-03-22 NOTE — Patient Instructions (Addendum)
Please be very careful with vicodin - remember has tylenol in it so please do not take with tylenol - take 1 or the other   Do not take with alcohol or other sedating agents     Acetaminophen; Hydrocodone tablets or capsules What is this medicine? ACETAMINOPHEN; HYDROCODONE (a set a MEE noe fen; hye droe KOE done) is a pain reliever. It is used to treat moderate to severe pain. This medicine may be used for other purposes; ask your health care provider or pharmacist if you have questions. COMMON BRAND NAME(S): Anexsia, Bancap HC, Ceta-Plus, Co-Gesic, Comfortpak, Dolagesic, [employer], DuoCet, Hydrocet, Hydrogesic, Makanda, Lorcet HD, Lorcet Plus, Lortab, Margesic H, Maxidone, Antares, Polygesic, Donna, Lamar, Cabin crew, Vicodin, Vicodin ES, Vicodin HP, Charlane Ferretti What should I tell my health care provider before I take this medicine? They need to know if you have any of these conditions: -brain tumor -Crohn's disease, inflammatory bowel disease, or ulcerative colitis -drug abuse or addiction -head injury -heart or circulation problems -if you often drink alcohol -kidney disease or problems going to the bathroom -liver disease -lung disease, asthma, or breathing problems -an unusual or allergic reaction to acetaminophen, hydrocodone, other opioid analgesics, other medicines, foods, dyes, or preservatives -pregnant or trying to get pregnant -breast-feeding How should I use this medicine? Take this medicine by mouth with a glass of water. Follow the directions on the prescription label. You can take it with or without food. If it upsets your stomach, take it with food. Do not take your medicine more often than directed. A special MedGuide will be given to you by the pharmacist with each prescription and refill. Be sure to read this information carefully each time. Talk to your pediatrician regarding the use of this medicine in children. Special care may be needed. Overdosage: If  you think you have taken too much of this medicine contact a poison control center or emergency room at once. NOTE: This medicine is only for you. Do not share this medicine with others. What if I miss a dose? If you miss a dose, take it as soon as you can. If it is almost time for your next dose, take only that dose. Do not take double or extra doses. What may interact with this medicine? This medicine may interact with the following medications: -alcohol -antiviral medicines for HIV or AIDS -atropine -antihistamines for allergy, cough and cold -certain antibiotics like erythromycin, clarithromycin -certain medicines for anxiety or sleep -certain medicines for bladder problems like oxybutynin, tolterodine -certain medicines for depression like amitriptyline, fluoxetine, sertraline -certain medicines for fungal infections like ketoconazole and itraconazole -certain medicines for Parkinson's disease like benztropine, trihexyphenidyl -certain medicines for seizures like carbamazepine, phenobarbital, phenytoin, primidone -certain medicines for stomach problems like dicyclomine, hyoscyamine -certain medicines for travel sickness like scopolamine -general anesthetics like halothane, isoflurane, methoxyflurane, propofol -ipratropium -local anesthetics like lidocaine, pramoxine, tetracaine -MAOIs like Carbex, Eldepryl, Marplan, Nardil, and Parnate -medicines that relax muscles for surgery -other medicines with acetaminophen -other narcotic medicines for pain or cough -phenothiazines like chlorpromazine, mesoridazine, prochlorperazine, thioridazine -rifampin This list may not describe all possible interactions. Give your health care provider a list of all the medicines, herbs, non-prescription drugs, or dietary supplements you use. Also tell them if you smoke, drink alcohol, or use illegal drugs. Some items may interact with your medicine. What should I watch for while using this medicine? Tell  your doctor or health care professional if your pain does not go away, if it gets worse,  or if you have new or a different type of pain. You may develop tolerance to the medicine. Tolerance means that you will need a higher dose of the medicine for pain relief. Tolerance is normal and is expected if you take the medicine for a long time. Do not suddenly stop taking your medicine because you may develop a severe reaction. Your body becomes used to the medicine. This does NOT mean you are addicted. Addiction is a behavior related to getting and using a drug for a non-medical reason. If you have pain, you have a medical reason to take pain medicine. Your doctor will tell you how much medicine to take. If your doctor wants you to stop the medicine, the dose will be slowly lowered over time to avoid any side effects. There are different types of narcotic medicines (opiates). If you take more than one type at the same time or if you are taking another medicine that also causes drowsiness, you may have more side effects. Give your health care provider a list of all medicines you use. Your doctor will tell you how much medicine to take. Do not take more medicine than directed. Call emergency for help if you have problems breathing or unusual sleepiness. Do not take other medicines that contain acetaminophen with this medicine. Always read labels carefully. If you have questions, ask your doctor or pharmacist. If you take too much acetaminophen get medical help right away. Too much acetaminophen can be very dangerous and cause liver damage. Even if you do not have symptoms, it is important to get help right away. You may get drowsy or dizzy. Do not drive, use machinery, or do anything that needs mental alertness until you know how this medicine affects you. Do not stand or sit up quickly, especially if you are an older patient. This reduces the risk of dizzy or fainting spells. Alcohol may interfere with the effect of  this medicine. Avoid alcoholic drinks. The medicine will cause constipation. Try to have a bowel movement at least every 2 to 3 days. If you do not have a bowel movement for 3 days, call your doctor or health care professional. Your mouth may get dry. Chewing sugarless gum or sucking hard candy, and drinking plenty of water may help. Contact your doctor if the problem does not go away or is severe. What side effects may I notice from receiving this medicine? Side effects that you should report to your doctor or health care professional as soon as possible: -allergic reactions like skin rash, itching or hives, swelling of the face, lips, or tongue -breathing problems -confusion -redness, blistering, peeling or loosening of the skin, including inside the mouth -signs and symptoms of low blood pressure like dizziness; feeling faint or lightheaded, falls; unusually weak or tired -trouble passing urine or change in the amount of urine -yellowing of the eyes or skin Side effects that usually do not require medical attention (report to your doctor or health care professional if they continue or are bothersome): -constipation -dry mouth -nausea, vomiting -tiredness This list may not describe all possible side effects. Call your doctor for medical advice about side effects. You may report side effects to FDA at 1-800-FDA-1088. Where should I keep my medicine? Keep out of the reach of children. This medicine can be abused. Keep your medicine in a safe place to protect it from theft. Do not share this medicine with anyone. Selling or giving away this medicine is dangerous and against the law.  This medicine may cause accidental overdose and death if it taken by other adults, children, or pets. Mix any unused medicine with a substance like cat litter or coffee grounds. Then throw the medicine away in a sealed container like a sealed bag or a coffee can with a lid. Do not use the medicine after the expiration  date. Store at room temperature between 15 and 30 degrees C (59 and 86 degrees F). NOTE: This sheet is a summary. It may not cover all possible information. If you have questions about this medicine, talk to your doctor, pharmacist, or health care provider.  2018 Elsevier/Gold Standard (2015-06-12 10:02:16)

## 2018-03-22 NOTE — Progress Notes (Signed)
Assessment and Plan:  Susan [name] was seen today for jaw pain.  Diagnoses and all orders for this visit:  Jaw pain, non-TMJ Cannot rule out parotid gland involvement, bony tumor or necrosis, low suspicion of arteritis or infectious origin/abscess but will obtain serum studies to fully rule this out, follow up to be determined pending results       Will proceed with doxycycline after discussion with Dr. Melford Aase Advised patient to not wear her partial, keep appointment with dentist -     CBC with Differential/Platelet -     CRP (C-Reactive Protein) -     Sedimentation rate -     COMPLETE METABOLIC PANEL WITH GFR -     CT Maxillofacial WO CM; Future  Localized swelling, mass, and lump of head -     CT Maxillofacial WO CM; Future  Other orders -     HYDROcodone-acetaminophen (NORCO/VICODIN) 5-325 MG tablet; Take 1 tablet by mouth every 6 (six) hours as needed for moderate pain. -     doxycycline (VIBRA-TABS) 100 MG tablet; Take 1 tablet (100 mg total) by mouth 2 (two) times daily for 7 days.  Further disposition pending results of labs. Discussed med's effects and SE's.   Over 15 minutes of exam, counseling, chart review, and critical decision making was performed.   Future Appointments  Date Time Provider [place]  03/30/2018  8:00 AM [facility] LAB 3 [facility] None  03/30/2018  8:45 AM [facility] INJ NURSE [facility] None  04/20/2018  8:00 AM [facility] LAB 4 [facility] None  04/20/2018  8:45 AM [facility] [place] FLUSH [facility] None  05/03/2018  8:30 AM [facility] LAB 1 [facility] None  05/03/2018  9:00 AM Shadad, Mathis Dad, MD [facility] None  05/03/2018  9:45 AM [facility] [place] FLUSH [facility] None  05/25/2018  8:00 AM [facility] LAB 6 [facility] None  05/25/2018  8:45 AM [facility] [place] FLUSH [facility] None  05/28/2018  9:30 AM Unk Pinto, MD [facility] None  11/20/2018  2:00 PM Unk Pinto, MD [facility] None  03/04/2019  9:00 AM [name], NP [facility] None     ------------------------------------------------------------------------------------------------------------------   HPI BP 136/82   Pulse 64   Temp 97.7 F (36.5 C)   Ht 5' 2.5" (1.588 m)   Wt 163 lb 12.8 oz (74.3 kg)   SpO2 99%   BMI 29.48 kg/m   77 y.o.female with hx of anemia and myelodysplasia, htn, hyperlipidemia presents for evaluation of right lower jaw pain x5 days. She reports this pain began gradually, mainly noted with mastication, but also very tender to touch from exterior as well. Reports pain is achy, "like arthritis", reports 7/10, constant, but increased with palpation of jaw or surrounding tissues and mastication. She has been taking tylenol for chronic arthritis but reports this has not seemed to help. Has been eating soft foods only for past 3 days and increasing discomfort.  She wears partial dentures on the bottom and has appointment to see dentist but couldn't get in until next Friday.  Past Medical History:  Diagnosis Date  . Anemia   . Arthritis   . Blood transfusion without reported diagnosis   . GERD (gastroesophageal reflux disease)   . HLD (hyperlipidemia)   . Hypertension   . Hypothyroidism   . Myelodysplasia   . Vitamin D deficiency      Allergies  Allergen Reactions  . Codeine Anaphylaxis  . Penicillins Anaphylaxis  . Bactrim Itching and Swelling  . Clarithromycin Other (See Comments)    "Caused skin to  peel off my hand."  . Flagyl [Metronidazole Hcl] Itching and Swelling  . Food Other (See Comments)    EATS WHITE RICE ONLY (ALLERGIC REACTION TO YELLOW OR BROWN RICE THAT RESULTED IN HOSPITALIZATION)  . Pineapple Itching and Swelling    SWELLING OF EYES  . Sulfa Antibiotics Itching and Swelling    Current Outpatient Medications on File Prior to Visit  Medication Sig  . acetaminophen (TYLENOL) 500 MG tablet Take 500-1,000 mg by mouth every 6 (six) hours as needed (for pain.).  [name] amLODipine (NORVASC) 10 MG tablet TAKE 1/2 TABLET BY  MOUTH DAILY AT BEDTIME; (TAKE 1 TABLET IF SBP IS >150)  . aspirin 81 MG chewable tablet Chew 81 mg by mouth daily.   [name] azithromycin (ZITHROMAX) 250 MG tablet Take 2 tablets (500 mg) on  Day 1,  followed by 1 tablet (250 mg) once daily on Days 2 through 5.  . Cholecalciferol (VITAMIN D-3) 5000 UNITS TABS Take 15,000 Units by mouth daily.   . Darbepoetin Alfa (ARANESP, ALBUMIN FREE,) 300 MCG/ML SOLN Inject 300 mcg into the skin See admin instructions. USE INJECTION EVERY 3 WEEKS AS NEEDED FOR HEMOGLOBIN LESS THAN 11.  . diphenhydrAMINE (BENADRYL) 25 mg capsule One capsule every 6 hours x 3 days, then as needed for symptoms of rash/itching  . hydroxypropyl methylcellulose / hypromellose (ISOPTO TEARS / GONIOVISC) 2.5 % ophthalmic solution Place 1-2 drops into both eyes 3 (three) times daily as needed for dry eyes.  [name] labetalol (NORMODYNE) 300 MG tablet TAKE 1 TABLET BY MOUTH TWICE DAILY  . levothyroxine (SYNTHROID, LEVOTHROID) 100 MCG tablet TAKE 1 TABLET BY MOUTH DAILY ON AN EMPTY STOMACH FOR 30 MINUTES  . Magnesium 500 MG TABS Take 500 mg by mouth at bedtime as needed (leg cramps).  . promethazine-dextromethorphan (PROMETHAZINE-DM) 6.25-15 MG/5ML syrup Take 1 to 2 tsp enery 4 hours if needed for cough  . pyrilamine-phenylephrine-dextromethorphan (CODIMAL DM) 5-8.33-10 MG/5ML syrup Take 1 to 2 teaspoons every 4 hours if needed for severe cough  . ranitidine (ZANTAC) 300 MG tablet Take 1 to 2 tablets daily as needed for heartburn & reflux  . losartan-hydrochlorothiazide (HYZAAR) 100-25 MG tablet Take 1 tablet by mouth daily. (Patient not taking: Reported on 03/22/2018)   No current facility-administered medications on file prior to visit.     ROS: Review of Systems  Constitutional: Negative for chills, diaphoresis, fever, malaise/fatigue and weight loss.  HENT: Negative for congestion, ear discharge, ear pain, hearing loss, sinus pain, sore throat and tinnitus.   Eyes: Negative for blurred vision,  double vision and discharge.  Respiratory: Negative for cough, shortness of breath and wheezing.   Cardiovascular: Negative for chest pain and palpitations.  Gastrointestinal: Negative for abdominal pain, nausea and vomiting.  Musculoskeletal: Negative for back pain and neck pain.  Skin: Negative for rash.  Neurological: Negative for dizziness, tingling, speech change and headaches.  Psychiatric/Behavioral: The patient does not have insomnia.      Physical Exam:  BP 136/82   Pulse 64   Temp 97.7 F (36.5 C)   Ht 5' 2.5" (1.588 m)   Wt 163 lb 12.8 oz (74.3 kg)   SpO2 99%   BMI 29.48 kg/m   General Appearance: Well nourished, in no apparent distress. Eyes: PERRLA, EOMs, conjunctiva no swelling or erythema Sinuses: No Frontal/maxillary tenderness ENT/Mouth: Ext aud canals clear, TMs without erythema, bulging. No erythema, swelling, or exudate on post pharynx.  Tonsils not swollen or erythematous. Hearing normal. Wears partials of lower jaw  with single molar remaining; no tissue edema/injection of gums; pain of right lower jaw, extends from angle of ramus through 2/3rds of body with exquisite bony and soft tissue tenderness, subtle swelling over parotid tissues and sublinqually without erythema, discharge, odor. Single 23mm vesicle to rightlublingual area.  Neck: Supple, thyroid normal.  Respiratory: Respiratory effort normal, BS equal bilaterally without rales, rhonchi, wheezing or stridor.  Cardio: RRR with no MRGs. Brisk peripheral pulses without edema.  Lymphatics: Non tender without lymphadenopathy.  Musculoskeletal: Full ROM, no TMJ tenderness, popping/clicking/crepitus Skin: Warm, dry without rashes, lesions, ecchymosis.  Neuro: Cranial nerves intact. Normal muscle tone, Sensation intact.  Psych: Awake and oriented X 3, normal affect, Insight and Judgment appropriate.     Izora Ribas, NP 9:28 AM [facility]

## 2018-03-23 LAB — COMPLETE METABOLIC PANEL WITH GFR
AG Ratio: 1.3 (calc) (ref 1.0–2.5)
ALBUMIN MSPROF: 4.2 g/dL (ref 3.6–5.1)
ALKALINE PHOSPHATASE (APISO): 91 U/L (ref 33–130)
ALT: 10 U/L (ref 6–29)
AST: 16 U/L (ref 10–35)
BILIRUBIN TOTAL: 0.7 mg/dL (ref 0.2–1.2)
BUN / CREAT RATIO: 15 (calc) (ref 6–22)
BUN: 23 mg/dL (ref 7–25)
CHLORIDE: 108 mmol/L (ref 98–110)
CO2: 27 mmol/L (ref 20–32)
Calcium: 9.8 mg/dL (ref 8.6–10.4)
Creat: 1.52 mg/dL — ABNORMAL HIGH (ref 0.60–0.93)
GFR, Est African American: 38 mL/min/{1.73_m2} — ABNORMAL LOW (ref >=60)
GFR, Est Non African American: 32 mL/min/{1.73_m2} — ABNORMAL LOW (ref >=60)
GLOBULIN: 3.2 g/dL (ref 1.9–3.7)
GLUCOSE: 79 mg/dL (ref 65–99)
Potassium: 4.4 mmol/L (ref 3.5–5.3)
SODIUM: 141 mmol/L (ref 135–146)
TOTAL PROTEIN: 7.4 g/dL (ref 6.1–8.1)

## 2018-03-23 LAB — CBC WITH DIFFERENTIAL/PLATELET
BASOS PCT: 0.3 %
Basophils Absolute: 10 cells/uL (ref 0–200)
EOS ABS: 89 {cells}/uL (ref 15–500)
Eosinophils Relative: 2.7 %
HEMATOCRIT: 32.3 % — AB (ref 35.0–45.0)
Hemoglobin: 10.5 g/dL — ABNORMAL LOW (ref 11.7–15.5)
LYMPHS ABS: 1495 {cells}/uL (ref 850–3900)
MCH: 27.6 pg (ref 27.0–33.0)
MCHC: 32.5 g/dL (ref 32.0–36.0)
MCV: 85 fL (ref 80.0–100.0)
MPV: 11.2 fL (ref 7.5–12.5)
Monocytes Relative: 9.4 %
NEUTROS PCT: 42.3 %
Neutro Abs: 1396 cells/uL — ABNORMAL LOW (ref 1500–7800)
Platelets: 176 10*3/uL (ref 140–400)
RBC: 3.8 10*6/uL (ref 3.80–5.10)
RDW: 14.9 % (ref 11.0–15.0)
TOTAL LYMPHOCYTE: 45.3 %
WBC: 3.3 10*3/uL — ABNORMAL LOW (ref 3.8–10.8)
WBCMIX: 310 {cells}/uL (ref 200–950)

## 2018-03-23 LAB — SEDIMENTATION RATE: Sed Rate: 25 mm/h (ref 0–30)

## 2018-03-23 LAB — C-REACTIVE PROTEIN: CRP: 2.5 mg/L (ref <8.0)

## 2018-03-30 ENCOUNTER — Inpatient Hospital Stay: Payer: Medicare Other

## 2018-03-30 DIAGNOSIS — D469 Myelodysplastic syndrome, unspecified: Secondary | ICD-10-CM

## 2018-03-30 DIAGNOSIS — D631 Anemia in chronic kidney disease: Secondary | ICD-10-CM | POA: Diagnosis not present

## 2018-03-30 DIAGNOSIS — N189 Chronic kidney disease, unspecified: Secondary | ICD-10-CM | POA: Diagnosis not present

## 2018-03-30 LAB — CBC WITH DIFFERENTIAL/PLATELET
Basophils Absolute: 0 10*3/uL (ref 0.0–0.1)
Basophils Relative: 1 %
EOS PCT: 2 %
Eosinophils Absolute: 0.1 10*3/uL (ref 0.0–0.5)
HCT: 32.9 % — ABNORMAL LOW (ref 34.8–46.6)
Hemoglobin: 11.1 g/dL — ABNORMAL LOW (ref 11.6–15.9)
LYMPHS ABS: 1.6 10*3/uL (ref 0.9–3.3)
LYMPHS PCT: 44 %
MCH: 28.7 pg (ref 25.1–34.0)
MCHC: 33.6 g/dL (ref 31.5–36.0)
MCV: 85.4 fL (ref 79.5–101.0)
MONO ABS: 0.3 10*3/uL (ref 0.1–0.9)
MONOS PCT: 10 %
Neutro Abs: 1.5 10*3/uL (ref 1.5–6.5)
Neutrophils Relative %: 43 %
PLATELETS: 93 10*3/uL — AB (ref 145–400)
RBC: 3.86 MIL/uL (ref 3.70–5.45)
RDW: 16 % — AB (ref 11.2–14.5)
WBC: 3.5 10*3/uL — ABNORMAL LOW (ref 3.9–10.3)

## 2018-03-30 MED ORDER — DARBEPOETIN ALFA 300 MCG/0.6ML IJ SOSY
PREFILLED_SYRINGE | INTRAMUSCULAR | Status: AC
Start: 2018-03-30 — End: ?
  Filled 2018-03-30: qty 0.6

## 2018-03-30 NOTE — Progress Notes (Signed)
Hemoglobin noted at 11.1 today. No injection needed as per Dr. Danise Mina orders.  Pt given current copy of labs and schedule. Instructed to follow schedule and to call our office should issues occur.

## 2018-04-17 ENCOUNTER — Ambulatory Visit (INDEPENDENT_AMBULATORY_CARE_PROVIDER_SITE_OTHER): Payer: Medicare Other | Admitting: Adult Health

## 2018-04-17 ENCOUNTER — Encounter: Payer: Self-pay | Admitting: Internal Medicine

## 2018-04-17 VITALS — BP 130/78 | HR 59 | Temp 97.3°F | Ht 62.5 in | Wt 158.0 lb

## 2018-04-17 DIAGNOSIS — M7061 Trochanteric bursitis, right hip: Secondary | ICD-10-CM | POA: Diagnosis not present

## 2018-04-17 DIAGNOSIS — M160 Bilateral primary osteoarthritis of hip: Secondary | ICD-10-CM | POA: Diagnosis not present

## 2018-04-17 MED ORDER — DEXAMETHASONE SODIUM PHOSPHATE 10 MG/ML IJ SOLN
10.0000 mg | Freq: Once | INTRAMUSCULAR | Status: AC
  Administered 2018-04-17: 10 mg

## 2018-04-17 NOTE — Progress Notes (Signed)
Assessment and Plan:  Susan [name] was seen today for arthritis.  Diagnoses and all orders for this visit:  Trochanteric bursitis of right hip Injection- area cleaned with alcohol, betadine, Dexamethasone 10mg  and 1 CC lidocaine injected into right bursa, tolerated well Continue tylenol, norco PRN severe pain     - decadron 10 mg/ml, 1 ml, injected to right hip greater trochanter bursa, once  Bilateral hip joint arthritis Also has underlying/established arthritis that was managed by ortho prior to relocating, has had injections previously, management by tylenol is becoming inadequate, not a candidate for NSAID due to renal function Will refer to ortho for ongoing management -     Ambulatory referral to Orthopedics  Further disposition pending results of labs. Discussed med's effects and SE's.   Over 15 minutes of exam, counseling, chart review, and critical decision making was performed.   Future Appointments  Date Time Provider [place]  04/20/2018  8:00 AM [facility] 4 [facility] None  04/20/2018  8:45 AM [facility] [place] FLUSH [facility] None  05/03/2018  8:30 AM [facility] LAB 1 [facility] None  05/03/2018  9:00 AM Wyatt Portela, MD [facility] None  05/03/2018  9:45 AM [facility] [place] FLUSH [facility] None  05/25/2018  8:00 AM [facility] LAB 6 [facility] None  05/25/2018  8:45 AM [facility] [place] FLUSH [facility] None  05/28/2018  9:30 AM Unk Pinto, MD [facility] None  11/20/2018  2:00 PM Unk Pinto, MD [facility] None  03/04/2019  9:00 AM [name], NP [facility] None    ------------------------------------------------------------------------------------------------------------------   HPI  BP 130/78   Pulse (!) 59   Temp (!) 97.3 F (36.3 C)   Ht 5' 2.5" (1.588 m)   Wt 158 lb (71.7 kg)   SpO2 97%   BMI 28.44 kg/m   77 y.o.female , AA with 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 She has been managing with tylenol due to renal functions, but reports pain has gradually been getting worse, peaked to 10/10, "deep ache" in hip joint, non-radiating, 3 days ago. She has taken a norco that was prescribed for other pain that she had left over and improved to 3/10. She has been walking with a cane and reports this helps some.   Established with Dr. Tamera Punt for hx of rotator cuff tear/repair.   Past Medical History:  Diagnosis Date  . Anemia   . Arthritis   . Blood transfusion without reported diagnosis   . GERD (gastroesophageal reflux disease)   . HLD (hyperlipidemia)   . Hypertension   . Hypothyroidism   . Myelodysplasia   . Vitamin D deficiency      Allergies  Allergen Reactions  . Codeine Anaphylaxis  . Penicillins Anaphylaxis  . Bactrim Itching and Swelling  . Clarithromycin Other (See Comments)    "Caused skin to peel off my hand."  . Flagyl [Metronidazole Hcl] Itching and Swelling  . Food Other (See Comments)    EATS WHITE RICE ONLY (ALLERGIC REACTION TO YELLOW OR BROWN RICE THAT RESULTED IN HOSPITALIZATION)  . Pineapple Itching and Swelling    SWELLING OF EYES  . Sulfa Antibiotics Itching and Swelling    Current Outpatient Medications on File Prior to Visit  Medication Sig  . acetaminophen (TYLENOL) 500 MG tablet Take 500-1,000 mg by mouth every 6 (six) hours as needed (for pain.).  [name] amLODipine (NORVASC) 10 MG  tablet TAKE 1/2 TABLET BY MOUTH DAILY AT BEDTIME; (TAKE 1 TABLET IF SBP IS >150)  . aspirin 81 MG chewable tablet Chew 81 mg by mouth daily.   . Cholecalciferol (VITAMIN D-3) 5000 UNITS TABS Take 15,000 Units by mouth daily.   . Darbepoetin Alfa (ARANESP, ALBUMIN FREE,) 300 MCG/ML SOLN Inject 300 mcg into the skin See admin instructions. USE INJECTION EVERY 3 WEEKS AS NEEDED FOR HEMOGLOBIN LESS THAN 11.  . diphenhydrAMINE (BENADRYL) 25 mg capsule One capsule every 6 hours x 3  days, then as needed for symptoms of rash/itching  . HYDROcodone-acetaminophen (NORCO/VICODIN) 5-325 MG tablet Take 1 tablet by mouth every 6 (six) hours as needed for moderate pain.  . hydroxypropyl methylcellulose / hypromellose (ISOPTO TEARS / GONIOVISC) 2.5 % ophthalmic solution Place 1-2 drops into both eyes 3 (three) times daily as needed for dry eyes.  [name] labetalol (NORMODYNE) 300 MG tablet TAKE 1 TABLET BY MOUTH TWICE DAILY  . levothyroxine (SYNTHROID, LEVOTHROID) 100 MCG tablet TAKE 1 TABLET BY MOUTH DAILY ON AN EMPTY STOMACH FOR 30 MINUTES  . Magnesium 500 MG TABS Take 500 mg by mouth at bedtime as needed (leg cramps).  . OMEPRAZOLE PO Take by mouth daily.  . promethazine-dextromethorphan (PROMETHAZINE-DM) 6.25-15 MG/5ML syrup Take 1 to 2 tsp enery 4 hours if needed for cough  . azithromycin (ZITHROMAX) 250 MG tablet Take 2 tablets (500 mg) on  Day 1,  followed by 1 tablet (250 mg) once daily on Days 2 through 5.  . losartan-hydrochlorothiazide (HYZAAR) 100-25 MG tablet Take 1 tablet by mouth daily. (Patient not taking: Reported on 03/22/2018)  . pyrilamine-phenylephrine-dextromethorphan (CODIMAL DM) 5-8.33-10 MG/5ML syrup Take 1 to 2 teaspoons every 4 hours if needed for severe cough (Patient not taking: Reported on 04/17/2018)  . ranitidine (ZANTAC) 300 MG tablet Take 1 to 2 tablets daily as needed for heartburn & reflux (Patient not taking: Reported on 04/17/2018)   No current facility-administered medications on file prior to visit.     ROS: all negative except above.   Physical Exam:  BP 130/78   Pulse (!) 59   Temp (!) 97.3 F (36.3 C)   Ht 5' 2.5" (1.588 m)   Wt 158 lb (71.7 kg)   SpO2 97%   BMI 28.44 kg/m   General Appearance: Well nourished, in no apparent distress. Neck: Supple.  Respiratory: Respiratory effort normal Cardio: RRR with no MRGs. Brisk peripheral pulses without edema.  Abdomen: Soft, + BS.  Non tender, no guarding, rebound, hernias, masses. Lymphatics:  Non tender without lymphadenopathy.  Musculoskeletal: Full ROM but with reported pain/discomfort in bilateral hips, no palpable crepitus, catching/clicking, Symmetrical strength, antalgic gait with cane. She is very tender over right greater trochanter without visible erythema/swelling.  Skin: Warm, dry without rashes, lesions, ecchymosis.  Neuro: Cranial nerves intact. Normal muscle tone, no cerebellar symptoms. Sensation intact.  Psych: Awake and oriented X 3, normal affect, Insight and Judgment appropriate.    Izora Ribas, NP 12:13 PM [facility]

## 2018-04-17 NOTE — Patient Instructions (Signed)
Hip Bursitis Hip bursitis is inflammation of a fluid-filled sac (bursa) in the hip joint. The bursa protects the bones in the hip joint from rubbing against each other. Hip bursitis can cause mild to moderate pain, and symptoms often come and go over time. What are the causes? This condition may be caused by:  Injury to the hip.  Overuse of the muscles that surround the hip joint.  Arthritis or gout.  Diabetes.  Thyroid disease.  Cold weather.  Infection.  In some cases, the cause may not be known. What are the signs or symptoms? Symptoms of this condition may include:  Mild or moderate pain in the hip area. Pain may get worse with movement.  Tenderness and swelling of the hip, especially on the outer side of the hip.  Symptoms may come and go. If the bursa becomes infected, you may have the following symptoms:  Fever.  Red skin and a feeling of warmth in the hip area.  How is this diagnosed? This condition may be diagnosed based on:  A physical exam.  Your medical history.  X-rays.  Removal of fluid from your inflamed bursa for testing (biopsy).  You may be sent to a health care provider who specializes in bone diseases (orthopedist) or a provider who specializes in joint inflammation (rheumatologist). How is this treated? This condition is treated by resting, raising (elevating), and applying pressure(compression) to the injured area. In some cases, this may be enough to make your symptoms go away. Treatment may also include:  Crutches.  Antibiotic medicine.  Draining fluid out of the bursa to help relieve swelling.  Injecting medicine that helps to reduce inflammation (cortisone).  Follow these instructions at home: Medicines  Take over-the-counter and prescription medicines only as told by your health care provider.  Do not drive or operate heavy machinery while taking prescription pain medicine, or as told by your health care provider.  If you were  prescribed an antibiotic, take it as told by your health care provider. Do not stop taking the antibiotic even if you start to feel better. Activity  Return to your normal activities as told by your health care provider. Ask your health care provider what activities are safe for you.  Rest and protect your hip as much as possible until your pain and swelling get better. General instructions  Wear compression wraps only as told by your health care provider.  Elevate your hip above the level of your heart as much as you can without pain. To do this, try putting a pillow under your hips while you lie down.  Do not use your hip to support your body weight until your health care provider says that you can. Use crutches as told by your health care provider.  Gently massage and stretch your injured area as often as is comfortable.  Keep all follow-up visits as told by your health care provider. This is important. How is this prevented?  Exercise regularly, as told by your health care provider.  Warm up and stretch before being active.  Cool down and stretch after being active.  If an activity irritates your hip or causes pain, avoid the activity as much as possible.  Avoid sitting down for long periods at a time. Contact a health care provider if:  You have a fever.  You develop new symptoms.  You have difficulty walking or doing everyday activities.  You have pain that gets worse or does not get better with medicine.  You   develop red skin or a feeling of warmth in your hip area. Get help right away if:  You cannot move your hip.  You have severe pain. This information is not intended to replace advice given to you by your health care provider. Make sure you discuss any questions you have with your health care provider. Document Released: 03/11/2002 Document Revised: 02/25/2016 Document Reviewed: 04/21/2015 Elsevier Interactive Patient Education  2018 [employer]  

## 2018-04-20 ENCOUNTER — Inpatient Hospital Stay: Payer: Medicare Other | Attending: Oncology

## 2018-04-20 ENCOUNTER — Inpatient Hospital Stay: Payer: Medicare Other

## 2018-04-20 VITALS — BP 157/78 | HR 62 | Temp 98.2°F | Resp 18

## 2018-04-20 DIAGNOSIS — N189 Chronic kidney disease, unspecified: Secondary | ICD-10-CM | POA: Insufficient documentation

## 2018-04-20 DIAGNOSIS — D631 Anemia in chronic kidney disease: Secondary | ICD-10-CM

## 2018-04-20 DIAGNOSIS — D469 Myelodysplastic syndrome, unspecified: Secondary | ICD-10-CM

## 2018-04-20 LAB — CBC WITH DIFFERENTIAL/PLATELET
Basophils Absolute: 0 10*3/uL (ref 0.0–0.1)
Basophils Relative: 1 %
EOS ABS: 0.1 10*3/uL (ref 0.0–0.5)
EOS PCT: 2 %
HCT: 29.5 % — ABNORMAL LOW (ref 34.8–46.6)
Hemoglobin: 10.1 g/dL — ABNORMAL LOW (ref 11.6–15.9)
LYMPHS ABS: 2 10*3/uL (ref 0.9–3.3)
Lymphocytes Relative: 43 %
MCH: 28.5 pg (ref 25.1–34.0)
MCHC: 34.4 g/dL (ref 31.5–36.0)
MCV: 83.1 fL (ref 79.5–101.0)
MONOS PCT: 7 %
Monocytes Absolute: 0.3 10*3/uL (ref 0.1–0.9)
Neutro Abs: 2.2 10*3/uL (ref 1.5–6.5)
Neutrophils Relative %: 47 %
PLATELETS: 154 10*3/uL (ref 145–400)
RBC: 3.55 MIL/uL — ABNORMAL LOW (ref 3.70–5.45)
RDW: 15.2 % — ABNORMAL HIGH (ref 11.2–14.5)
WBC: 4.8 10*3/uL (ref 3.9–10.3)

## 2018-04-20 MED ORDER — DARBEPOETIN ALFA 300 MCG/0.6ML IJ SOSY
300.0000 ug | PREFILLED_SYRINGE | INTRAMUSCULAR | Status: DC
  Administered 2018-04-20: 300 ug via SUBCUTANEOUS

## 2018-04-20 NOTE — Patient Instructions (Signed)
Darbepoetin Alfa injection What is this medicine? DARBEPOETIN ALFA (dar be POE e tin AL fa) helps your body make more red blood cells. It is used to treat anemia caused by chronic kidney failure and chemotherapy. This medicine may be used for other purposes; ask your health care provider or pharmacist if you have questions. COMMON BRAND NAME(S): Aranesp What should I tell my health care provider before I take this medicine? They need to know if you have any of these conditions: -blood clotting disorders or history of blood clots -cancer patient not on chemotherapy -cystic fibrosis -heart disease, such as angina, heart failure, or a history of a heart attack -hemoglobin level of 12 g/dL or greater -high blood pressure -low levels of folate, iron, or vitamin B12 -seizures -an unusual or allergic reaction to darbepoetin, erythropoietin, albumin, hamster proteins, latex, other medicines, foods, dyes, or preservatives -pregnant or trying to get pregnant -breast-feeding How should I use this medicine? This medicine is for injection into a vein or under the skin. It is usually given by a health care professional in a hospital or clinic setting. If you get this medicine at home, you will be taught how to prepare and give this medicine. Do not shake the solution before you withdraw a dose. Use exactly as directed. Take your medicine at regular intervals. Do not take your medicine more often than directed. It is important that you put your used needles and syringes in a special sharps container. Do not put them in a trash can. If you do not have a sharps container, call your pharmacist or healthcare provider to get one. Talk to your pediatrician regarding the use of this medicine in children. While this medicine may be used in children as young as 1 year for selected conditions, precautions do apply. Overdosage: If you think you have taken too much of this medicine contact a poison control center or  emergency room at once. NOTE: This medicine is only for you. Do not share this medicine with others. What if I miss a dose? If you miss a dose, take it as soon as you can. If it is almost time for your next dose, take only that dose. Do not take double or extra doses. What may interact with this medicine? Do not take this medicine with any of the following medications: -epoetin alfa This list may not describe all possible interactions. Give your health care provider a list of all the medicines, herbs, non-prescription drugs, or dietary supplements you use. Also tell them if you smoke, drink alcohol, or use illegal drugs. Some items may interact with your medicine. What should I watch for while using this medicine? Visit your prescriber or health care professional for regular checks on your progress and for the needed blood tests and blood pressure measurements. It is especially important for the doctor to make sure your hemoglobin level is in the desired range, to limit the risk of potential side effects and to give you the best benefit. Keep all appointments for any recommended tests. Check your blood pressure as directed. Ask your doctor what your blood pressure should be and when you should contact him or her. As your body makes more red blood cells, you may need to take iron, folic acid, or vitamin B supplements. Ask your doctor or health care provider which products are right for you. If you have kidney disease continue dietary restrictions, even though this medication can make you feel better. Talk with your doctor or health   care professional about the foods you eat and the vitamins that you take. What side effects may I notice from receiving this medicine? Side effects that you should report to your doctor or health care professional as soon as possible: -allergic reactions like skin rash, itching or hives, swelling of the face, lips, or tongue -breathing problems -changes in vision -chest  pain -confusion, trouble speaking or understanding -feeling faint or lightheaded, falls -high blood pressure -muscle aches or pains -pain, swelling, warmth in the leg -rapid weight gain -severe headaches -sudden numbness or weakness of the face, arm or leg -trouble walking, dizziness, loss of balance or coordination -seizures (convulsions) -swelling of the ankles, feet, hands -unusually weak or tired Side effects that usually do not require medical attention (report to your doctor or health care professional if they continue or are bothersome): -diarrhea -fever, chills (flu-like symptoms) -headaches -nausea, vomiting -redness, stinging, or swelling at site where injected This list may not describe all possible side effects. Call your doctor for medical advice about side effects. You may report side effects to FDA at 1-800-FDA-1088. Where should I keep my medicine? Keep out of the reach of children. Store in a refrigerator between 2 and 8 degrees C (36 and 46 degrees F). Do not freeze. Do not shake. Throw away any unused portion if using a single-dose vial. Throw away any unused medicine after the expiration date. NOTE: This sheet is a summary. It may not cover all possible information. If you have questions about this medicine, talk to your doctor, pharmacist, or health care provider.  2015, [employer]. (2008-09-02 10:23:57)  

## 2018-04-22 ENCOUNTER — Emergency Department (HOSPITAL_COMMUNITY): Payer: Medicare Other

## 2018-04-22 ENCOUNTER — Other Ambulatory Visit: Payer: Self-pay

## 2018-04-22 ENCOUNTER — Encounter (HOSPITAL_COMMUNITY): Payer: Self-pay

## 2018-04-22 ENCOUNTER — Emergency Department (HOSPITAL_COMMUNITY)
Admission: EM | Admit: 2018-04-22 | Discharge: 2018-04-22 | Disposition: A | Discharge status: Home / Self Care | Payer: Medicare Other | Attending: Emergency Medicine | Admitting: Emergency Medicine

## 2018-04-22 DIAGNOSIS — I129 Hypertensive chronic kidney disease with stage 1 through stage 4 chronic kidney disease, or unspecified chronic kidney disease: Secondary | ICD-10-CM | POA: Insufficient documentation

## 2018-04-22 DIAGNOSIS — Y9301 Activity, walking, marching and hiking: Secondary | ICD-10-CM | POA: Insufficient documentation

## 2018-04-22 DIAGNOSIS — E039 Hypothyroidism, unspecified: Secondary | ICD-10-CM | POA: Insufficient documentation

## 2018-04-22 DIAGNOSIS — Y999 Unspecified external cause status: Secondary | ICD-10-CM | POA: Diagnosis not present

## 2018-04-22 DIAGNOSIS — Z7982 Long term (current) use of aspirin: Secondary | ICD-10-CM | POA: Diagnosis not present

## 2018-04-22 DIAGNOSIS — N183 Chronic kidney disease, stage 3 (moderate): Secondary | ICD-10-CM | POA: Insufficient documentation

## 2018-04-22 DIAGNOSIS — S79911A Unspecified injury of right hip, initial encounter: Secondary | ICD-10-CM | POA: Diagnosis present

## 2018-04-22 DIAGNOSIS — S336XXA Sprain of sacroiliac joint, initial encounter: Secondary | ICD-10-CM | POA: Diagnosis not present

## 2018-04-22 DIAGNOSIS — Z87891 Personal history of nicotine dependence: Secondary | ICD-10-CM | POA: Insufficient documentation

## 2018-04-22 DIAGNOSIS — Z79899 Other long term (current) drug therapy: Secondary | ICD-10-CM | POA: Insufficient documentation

## 2018-04-22 DIAGNOSIS — W101XXA Fall (on)(from) sidewalk curb, initial encounter: Secondary | ICD-10-CM | POA: Diagnosis not present

## 2018-04-22 DIAGNOSIS — Y92480 Sidewalk as the place of occurrence of the external cause: Secondary | ICD-10-CM | POA: Insufficient documentation

## 2018-04-22 DIAGNOSIS — W19XXXA Unspecified fall, initial encounter: Secondary | ICD-10-CM

## 2018-04-22 DIAGNOSIS — S99921A Unspecified injury of right foot, initial encounter: Secondary | ICD-10-CM | POA: Diagnosis not present

## 2018-04-22 DIAGNOSIS — M79671 Pain in right foot: Secondary | ICD-10-CM | POA: Diagnosis not present

## 2018-04-22 DIAGNOSIS — M25551 Pain in right hip: Secondary | ICD-10-CM | POA: Diagnosis not present

## 2018-04-22 NOTE — ED Notes (Signed)
Patient verbalizes understanding of discharge instructions. Opportunity for questioning and answers were provided. Armband removed by staff, pt discharged from ED.  

## 2018-04-22 NOTE — ED Triage Notes (Signed)
Pt presents for evaluation of R hip pain after mechanical fall over curb yesterday. Pt reports leg swelling.

## 2018-04-22 NOTE — Discharge Instructions (Addendum)
1.  If you take a half of a Vicodin tablet for pain, you may take 1 extra strength Tylenol tablet (500mg ) with it,  that will be the equivalent of 625 mg of Tylenol per dose every 4-6 hours. 2.  See your orthopedic doctor next week as scheduled. 3.  Return if you have worsening or changing pain.

## 2018-04-22 NOTE — ED Provider Notes (Signed)
[facility] Provider Note   CSN: [id number] Arrival date & time: 04/22/18  1358     History   Chief Complaint Chief Complaint  Patient presents with  . Hip Pain    HPI Susan [name] is a 77 y.o. female.  HPI Patient reports she had walked over to her house yesterday.  She was walking back to her house when she stepped up on the curb and her right hip suddenly hurt a lot and she fell landing on the right side of her hip.  She broke the fall with her right hand.  Reports she had pre-existing problems with pain in that hip and had gotten injections 4 days earlier.  She reports that she was able to get back up and has been walking around on the leg.  She reports is very painful though when she indicates her SI region.  He also reports some pain in the right hand where she broke her fall.  He does not have weakness or numbness into the leg.  Weakness or numbness of the upper extremities.  She had been prescribed Vicodin previously for back pain episodes.  She reports that she has to break them in half because a whole one knocks her out too much.  She reports it does actually help her a lot for pain. Past Medical History:  Diagnosis Date  . Anemia   . Arthritis   . Blood transfusion without reported diagnosis   . GERD (gastroesophageal reflux disease)   . HLD (hyperlipidemia)   . Hypertension   . Hypothyroidism   . Myelodysplasia   . Vitamin D deficiency     Patient Active Problem List   Diagnosis Date Noted  . 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 BMI 29.0-29.9,adult 09/02/2015  . Encounter for Medicare annual wellness exam 06/02/2015  . 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 CKD, 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) 11/02/2014  . Medication management 01/27/2014  . Hyperlipidemia 08/27/2013  . Essential hypertension   . Hypothyroidism   . GERD (gastroesophageal reflux disease)   . Vitamin D deficiency   . Hemorrhoids 07/15/2013     Past Surgical History:  Procedure Laterality Date  . CARPAL TUNNEL RELEASE Right   . DILATION AND CURETTAGE OF UTERUS    . INGUINAL HERNIA REPAIR    . ROTATOR CUFF REPAIR Left   . TRANSANAL HEMORRHOIDAL DEARTERIALIZATION N/A 03/31/2017   Procedure: TRANSANAL HEMORRHOIDAL DEARTERIALIZATION;  Surgeon: Leighton Ruff, MD;  Location: [facility];  Service: General;  Laterality: N/A;  . UMBILICAL HERNIA REPAIR       OB History   None      Home Medications    Prior to Admission medications   Medication Sig Start Date End Date Taking? Authorizing Provider  acetaminophen (TYLENOL) 500 MG tablet Take 500-1,000 mg by mouth every 6 (six) hours as needed (for pain.).    [provider]  amLODipine (NORVASC) 10 MG tablet TAKE 1/2 TABLET BY MOUTH DAILY AT BEDTIME; (TAKE 1 TABLET IF SBP IS >150) 07/20/17   [name], NP  aspirin 81 MG chewable tablet Chew 81 mg by mouth daily.     [provider]  azithromycin (ZITHROMAX) 250 MG tablet Take 2 tablets (500 mg) on  Day 1,  followed by 1 tablet (250 mg) once daily on Days 2 through 5. 01/15/18   Unk Pinto, MD  Cholecalciferol (VITAMIN D-3) 5000 UNITS TABS Take 15,000 Units by mouth daily.  [provider]  Darbepoetin Alfa (ARANESP, ALBUMIN FREE,) 300 MCG/ML SOLN Inject 300 mcg into the skin See admin instructions. USE INJECTION EVERY 3 WEEKS AS NEEDED FOR HEMOGLOBIN LESS THAN 11.    [provider]  diphenhydrAMINE (BENADRYL) 25 mg capsule One capsule every 6 hours x 3 days, then as needed for symptoms of rash/itching 03/25/17   Charlann Lange, PA-C  HYDROcodone-acetaminophen (NORCO/VICODIN) 5-325 MG tablet Take 1 tablet by mouth every 6 (six) hours as needed for moderate pain. 03/22/18   [name], NP  hydroxypropyl methylcellulose / hypromellose (ISOPTO TEARS / GONIOVISC) 2.5 % ophthalmic solution Place 1-2 drops into both eyes 3 (three) times daily as needed for dry eyes.    [provider]  labetalol (NORMODYNE) 300 MG tablet TAKE 1 TABLET BY MOUTH TWICE DAILY 02/01/18   Vicie Mutters, PA-C  levothyroxine (SYNTHROID, LEVOTHROID) 100 MCG tablet TAKE 1 TABLET BY MOUTH DAILY ON AN EMPTY STOMACH FOR 30 MINUTES 11/24/17   Unk Pinto, MD  losartan-hydrochlorothiazide (HYZAAR) 100-25 MG tablet Take 1 tablet by mouth daily. Patient not taking: Reported on 03/22/2018 07/20/17   [name], NP  Magnesium 500 MG TABS Take 500 mg by mouth at bedtime as needed (leg cramps).    [provider]  OMEPRAZOLE PO Take by mouth daily.    [provider]  promethazine-dextromethorphan (PROMETHAZINE-DM) 6.25-15 MG/5ML syrup Take 1 to 2 tsp enery 4 hours if needed for cough 01/15/18   Unk Pinto, MD  pyrilamine-phenylephrine-dextromethorphan [facility] DM) 5-8.33-10 MG/5ML syrup Take 1 to 2 teaspoons every 4 hours if needed for severe cough Patient not taking: Reported on 04/17/2018 01/17/18   Unk Pinto, MD  ranitidine (ZANTAC) 300 MG tablet Take 1 to 2 tablets daily as needed for heartburn & reflux Patient not taking: Reported on 04/17/2018 11/07/17 11/07/18  Unk Pinto, MD    Family History Family History  Problem Relation Age of Onset  . Stroke Father   . Hypertension Father   . Other Mother        died at early age  . Stroke Sister   . Coronary artery disease Brother   . Hypertension Sister   . Bladder Cancer Sister   . Colon cancer Neg Hx   . Stomach cancer Neg Hx     Social History Social History   Tobacco Use  . Smoking status: Former Smoker    Types: Cigarettes    Last attempt to quit: 10/02/1996    Years since quitting: 21.5  . Smokeless tobacco: Never Used  Substance Use Topics  . Alcohol use: Yes    Comment: glass of wine at least once a month.  . Drug use: No     Allergies   Codeine; Penicillins; Bactrim; Clarithromycin; Flagyl [metronidazole hcl]; Food; Pineapple; and Sulfa antibiotics   Review of Systems Review of  Systems 10 Systems reviewed and are negative for acute change except as noted in the HPI.  Physical Exam Updated Vital Signs BP (!) 163/83 (BP Location: Right Arm)   Pulse (!) 57   Temp 97.6 F (36.4 C) (Oral)   Resp 16   Ht 5\' 2"  (1.575 m)   Wt 74.8 kg (165 lb)   SpO2 100%   BMI 30.18 kg/m   Physical Exam  Constitutional: She is oriented to person, place, and time.  Patient is alert and clinically well in appearance.  No respiratory distress.  HENT:  Head: Atraumatic.  Eyes: EOM are normal.  Neck: Neck supple.  Cardiovascular:  Normal rate, regular rhythm, normal heart sounds and intact distal pulses.  Pulmonary/Chest: Effort normal and breath sounds normal.  Abdominal: Soft. She exhibits no distension. There is no tenderness. There is no guarding.  Musculoskeletal: Normal range of motion.  Patient has reproducible pain in the SI joint on the right.  She reports from their pain radiates down the back of her leg.  There is no visible bruising or soft tissue abnormality.  I can put the right leg through full range of motion with flexion at the hip and knee and she can push against resistance with good strength.  She endorses pain with straight leg raise at 45 degrees.  She has normal range of motion and strength of the left lower extremity.  Bilateral dorsalis pedis pulses 2+ and symmetric.  Neurological: She is alert and oriented to person, place, and time. She exhibits normal muscle tone. Coordination normal.  Skin: Skin is warm and dry.  Psychiatric: She has a normal mood and affect.     ED Treatments / Results  Labs (all labs ordered are listed, but only abnormal results are displayed) Labs Reviewed - No data to display  EKG None  Radiology Dg Foot Complete Right  Result Date: 04/22/2018 CLINICAL DATA:  Right foot pain after fall over curb yesterday EXAM: RIGHT FOOT COMPLETE - 3+ VIEW COMPARISON:  11/04/2016 FINDINGS: Mild hallux valgus deformity. Mild degenerate change  at the first MTP joint. No acute fracture or dislocation. Small inferior calcaneal spur. IMPRESSION: No acute findings. Electronically Signed   By: Marin Olp M.D.   On: 04/22/2018 14:48   Dg Hip Unilat  With Pelvis 2-3 Views Right  Result Date: 04/22/2018 CLINICAL DATA:  Pt c/o right hip/leg and right foot pain after falling outside over a curb yesterday. EXAM: DG HIP (WITH OR WITHOUT PELVIS) 2-3V RIGHT COMPARISON:  None. FINDINGS: Hips are located. No evidence of pelvic fracture or sacral fracture. Dedicated view of the RIGHT hip demonstrates no femoral neck fracture. IMPRESSION: No pelvic fracture or RIGHT hip fracture Electronically Signed   By: Suzy Bouchard M.D.   On: 04/22/2018 14:46    Procedures Procedures (including critical care time)  Medications Ordered in ED Medications - No data to display   Initial Impression / Assessment and Plan / ED Course  I have reviewed the triage vital signs and the nursing notes.  Pertinent labs & imaging results that were available during my care of the patient were reviewed by me and considered in my medical decision making (see chart for details).     Final Clinical Impressions(s) / ED Diagnoses   Final diagnoses:  Fall, initial encounter  Sprain of sacroiliac ligament, initial encounter   X-rays do not show any acute fractures.  Patient has good range of motion and I doubt occult fracture.  He had pre-existing right hip pain and it appears that she had sudden worsening pain with a certain twisting motion causing her to fall.  At this time I feel she is stable to follow-up with her orthopedic doctor next week as scheduled.  She reports she already has Vicodin at home.  She typically gets pain relief with a half a tablet.  Return precautions reviewed. ED Discharge Orders    None       Charlesetta Shanks, MD 04/22/18 1754

## 2018-04-25 ENCOUNTER — Encounter (HOSPITAL_COMMUNITY): Payer: Self-pay | Admitting: Emergency Medicine

## 2018-04-25 ENCOUNTER — Emergency Department (HOSPITAL_COMMUNITY): Payer: Medicare Other

## 2018-04-25 ENCOUNTER — Emergency Department (HOSPITAL_COMMUNITY)
Admission: EM | Admit: 2018-04-25 | Discharge: 2018-04-25 | Disposition: A | Discharge status: Home / Self Care | Payer: Medicare Other | Attending: Emergency Medicine | Admitting: Emergency Medicine

## 2018-04-25 DIAGNOSIS — Z79899 Other long term (current) drug therapy: Secondary | ICD-10-CM | POA: Insufficient documentation

## 2018-04-25 DIAGNOSIS — Z87891 Personal history of nicotine dependence: Secondary | ICD-10-CM | POA: Insufficient documentation

## 2018-04-25 DIAGNOSIS — E039 Hypothyroidism, unspecified: Secondary | ICD-10-CM | POA: Diagnosis not present

## 2018-04-25 DIAGNOSIS — I129 Hypertensive chronic kidney disease with stage 1 through stage 4 chronic kidney disease, or unspecified chronic kidney disease: Secondary | ICD-10-CM | POA: Diagnosis not present

## 2018-04-25 DIAGNOSIS — L03115 Cellulitis of right lower limb: Secondary | ICD-10-CM | POA: Insufficient documentation

## 2018-04-25 DIAGNOSIS — M7989 Other specified soft tissue disorders: Secondary | ICD-10-CM | POA: Diagnosis not present

## 2018-04-25 DIAGNOSIS — S99921A Unspecified injury of right foot, initial encounter: Secondary | ICD-10-CM | POA: Diagnosis not present

## 2018-04-25 DIAGNOSIS — N183 Chronic kidney disease, stage 3 (moderate): Secondary | ICD-10-CM | POA: Insufficient documentation

## 2018-04-25 DIAGNOSIS — M79671 Pain in right foot: Secondary | ICD-10-CM | POA: Diagnosis not present

## 2018-04-25 DIAGNOSIS — Z7982 Long term (current) use of aspirin: Secondary | ICD-10-CM | POA: Diagnosis not present

## 2018-04-25 LAB — CBC WITH DIFFERENTIAL/PLATELET
ABS IMMATURE GRANULOCYTES: 0 10*3/uL (ref 0.0–0.1)
BASOS ABS: 0 10*3/uL (ref 0.0–0.1)
BASOS PCT: 0 %
Eosinophils Absolute: 0 10*3/uL (ref 0.0–0.7)
Eosinophils Relative: 0 %
HCT: 28.6 % — ABNORMAL LOW (ref 36.0–46.0)
HEMOGLOBIN: 9.4 g/dL — AB (ref 12.0–15.0)
Immature Granulocytes: 1 %
LYMPHS PCT: 19 %
Lymphs Abs: 1.3 10*3/uL (ref 0.7–4.0)
MCH: 27.7 pg (ref 26.0–34.0)
MCHC: 32.9 g/dL (ref 30.0–36.0)
MCV: 84.4 fL (ref 78.0–100.0)
MONO ABS: 1 10*3/uL (ref 0.1–1.0)
Monocytes Relative: 14 %
NEUTROS ABS: 4.4 10*3/uL (ref 1.7–7.7)
NEUTROS PCT: 66 %
PLATELETS: 144 10*3/uL — AB (ref 150–400)
RBC: 3.39 MIL/uL — AB (ref 3.87–5.11)
RDW: 14.8 % (ref 11.5–15.5)
WBC: 6.7 10*3/uL (ref 4.0–10.5)

## 2018-04-25 LAB — BASIC METABOLIC PANEL
Anion gap: 8 (ref 5–15)
BUN: 13 mg/dL (ref 8–23)
CHLORIDE: 104 mmol/L (ref 98–111)
CO2: 25 mmol/L (ref 22–32)
CREATININE: 1.44 mg/dL — AB (ref 0.44–1.00)
Calcium: 9.3 mg/dL (ref 8.9–10.3)
GFR, EST AFRICAN AMERICAN: 39 mL/min — AB (ref >60)
GFR, EST NON AFRICAN AMERICAN: 34 mL/min — AB (ref >60)
Glucose, Bld: 124 mg/dL — ABNORMAL HIGH (ref 70–99)
POTASSIUM: 4.2 mmol/L (ref 3.5–5.1)
SODIUM: 137 mmol/L (ref 135–145)

## 2018-04-25 LAB — SEDIMENTATION RATE: Sed Rate: 17 mm/hr (ref 0–22)

## 2018-04-25 MED ORDER — FENTANYL CITRATE (PF) 100 MCG/2ML IJ SOLN: 50.0000 ug | Freq: Once | INTRAMUSCULAR | Status: DC

## 2018-04-25 MED ORDER — OXYCODONE-ACETAMINOPHEN 5-325 MG PO TABS
1.0000 | ORAL_TABLET | Freq: Once | ORAL | Status: AC
  Administered 2018-04-25: 1 via ORAL
  Filled 2018-04-25: qty 1

## 2018-04-25 MED ORDER — OXYCODONE-ACETAMINOPHEN 5-325 MG PO TABS: 1.0000 | ORAL_TABLET | ORAL | 0 refills | Status: DC | PRN

## 2018-04-25 MED ORDER — CLINDAMYCIN HCL 300 MG PO CAPS: 300.0000 mg | ORAL_CAPSULE | Freq: Four times a day (QID) | ORAL | 0 refills | Status: AC

## 2018-04-25 MED ORDER — IBUPROFEN 400 MG PO TABS
400.0000 mg | ORAL_TABLET | Freq: Once | ORAL | Status: AC
  Administered 2018-04-25: 400 mg via ORAL
  Filled 2018-04-25: qty 1

## 2018-04-25 MED ORDER — CLINDAMYCIN HCL 150 MG PO CAPS
300.0000 mg | ORAL_CAPSULE | Freq: Once | ORAL | Status: AC
  Administered 2018-04-25: 300 mg via ORAL
  Filled 2018-04-25: qty 2

## 2018-04-25 NOTE — ED Notes (Signed)
Patient transported to X-ray 

## 2018-04-25 NOTE — ED Triage Notes (Signed)
Pt reports right foot pain and swelling since Sunday, states she was here on 7/21 but was not given anything for her foot. Foot red and warm

## 2018-04-25 NOTE — ED Provider Notes (Signed)
I saw and evaluated the patient, reviewed the resident's note and I agree with the findings and plan with the following exceptions.   Pt with fall a few days ago. Initially with right wrist pain, right hip pain and right foot pain. xr's done. Everything improved aside from right foot pain which has had worsening swelling, erythema and pain.  Exam c/w same. Warm, tender, erythematous. Normal pulse. No deformity.  xr from the other day with widened space between 1st and 2nd metatarsal. Possibly chronic.  Concern for inflammatory abnormality vs cellulitis vs missed fracture. Will repeat XR but also check labs in anticipation of concern for cellulitis. If no occult fracture, will plan for abx/ice/NSAIDs/acewrap.   EKG Interpretation  Date/Time:    Ventricular Rate:    PR Interval:    QRS Duration:   QT Interval:    QTC Calculation:   R Axis:     Text Interpretation:           Merrily Pew, MD 04/25/18 1701

## 2018-04-25 NOTE — Discharge Instructions (Signed)
You have been diagnosed with cellulitis, a skin infection, of your right foot. Xrays today showed that there is no fracture. Take the antibiotic clindamycin 4 times per day for 7 days. Please wear the post-op boot we have provided you and your walker when you are walking around. Only bear weight as you are able to tolerate. You can take Percocet (prescription provided) for the pain. Do not take any of your home Vicodin while you are on Percocet. The Percocet will help with your right hip pain and right foot pain, so you do not need to take both. Please follow-up with your PCP in 3 days to ensure that the antibiotics are helping. Please return to the ED with fevers, worsening foot pain or swelling, redness that is traveling up your leg, or any other new or concerning symptoms.

## 2018-04-25 NOTE — ED Provider Notes (Signed)
[facility]PARTMENT Provider Note   CSN: [id number] Arrival date & time: 04/25/18  0350     History   Chief Complaint Chief Complaint  Patient presents with  . Foot Pain    HPI Susan [name] is a 77 y.o. female with a history of HTN, hypothyroidism, myelodysplasia, and arthritis who presents with right foot pain. She reports that her foot pain began on 7/21 after she had a mechanical fall on 7/20. She was evaluated in the ED for right hip pain and foot pain at that time. Xrays showed no fractures in either the hip or the foot, and she was discharged home with instructions to take her home Vicodin for pain. The pain in her foot never resolved, and then overnight the pain became 10/10. The pain is worst on the dorsal foot just proximal to the toes. The pain worsens with walking or touching the foot. She endorses redness and swelling of the right foot as well. She last took Vicodin at 3am with no relief. She denies fevers, chills, penetrating wounds to the foot, history of diabetes, or history of gout. She reports that her right hip feels improved.  Past Medical History:  Diagnosis Date  . Anemia   . Arthritis   . Blood transfusion without reported diagnosis   . GERD (gastroesophageal reflux disease)   . HLD (hyperlipidemia)   . Hypertension   . Hypothyroidism   . Myelodysplasia   . Vitamin D deficiency     Patient Active Problem List   Diagnosis Date Noted  . 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 BMI 29.0-29.9,adult 09/02/2015  . Encounter for Medicare annual wellness exam 06/02/2015  . 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[place]) 11/03/2014  . CKD, 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) 11/02/2014  . Medication management 01/27/2014  . Hyperlipidemia 08/27/2013  . Essential hypertension   . Hypothyroidism   . GERD (gastroesophageal reflux disease)   . Vitamin D deficiency   . Hemorrhoids 07/15/2013    Past Surgical History:  Procedure Laterality Date  . CARPAL  TUNNEL RELEASE Right   . DILATION AND CURETTAGE OF UTERUS    . INGUINAL HERNIA REPAIR    . ROTATOR CUFF REPAIR Left   . TRANSANAL HEMORRHOIDAL DEARTERIALIZATION N/A 03/31/2017   Procedure: TRANSANAL HEMORRHOIDAL DEARTERIALIZATION;  Surgeon: Leighton Ruff, MD;  Location: [facility];  Service: General;  Laterality: N/A;  . UMBILICAL HERNIA REPAIR       OB History   None      Home Medications    Prior to Admission medications   Medication Sig Start Date End Date Taking? Authorizing Provider  acetaminophen (TYLENOL) 500 MG tablet Take 500-1,000 mg by mouth every 6 (six) hours as needed (for pain.).   Yes [provider]  amLODipine (NORVASC) 10 MG tablet TAKE 1/2 TABLET BY MOUTH DAILY AT BEDTIME; (TAKE 1 TABLET IF SBP IS >150) Patient taking differently: Take 10 mg by mouth at bedtime. (TAKE 1 TABLET IF SBP IS >150) 07/20/17  Yes [name], NP  aspirin 81 MG chewable tablet Chew 81 mg by mouth daily.    Yes [provider]  Cholecalciferol (VITAMIN D-3) 5000 UNITS TABS Take 15,000 Units by mouth daily.    Yes [provider]  Darbepoetin Alfa (ARANESP, ALBUMIN FREE,) 300 MCG/ML SOLN Inject 300 mcg into the skin See admin instructions. USE INJECTION EVERY 3 WEEKS AS NEEDED FOR HEMOGLOBIN LESS THAN 11.   Yes [provider]  diclofenac (VOLTAREN) 75 MG EC  tablet Take 75 mg by mouth daily. 02/05/18  Yes [provider]  diphenhydrAMINE (BENADRYL) 25 mg capsule One capsule every 6 hours x 3 days, then as needed for symptoms of rash/itching 03/25/17  Yes Upstill, Nehemiah Settle, PA-C  HYDROcodone-acetaminophen (NORCO/VICODIN) 5-325 MG tablet Take 1 tablet by mouth every 6 (six) hours as needed for moderate pain. 03/22/18  Yes [name], NP  hydroxypropyl methylcellulose / hypromellose (ISOPTO TEARS / GONIOVISC) 2.5 % ophthalmic solution Place 1-2 drops into both eyes 3 (three) times daily as needed for dry eyes.   Yes [provider]  labetalol  (NORMODYNE) 300 MG tablet TAKE 1 TABLET BY MOUTH TWICE DAILY 02/01/18  Yes Vicie Mutters, PA-C  levothyroxine (SYNTHROID, LEVOTHROID) 100 MCG tablet TAKE 1 TABLET BY MOUTH DAILY ON AN EMPTY STOMACH FOR 30 MINUTES 11/24/17  Yes Unk Pinto, MD  Magnesium 500 MG TABS Take 500 mg by mouth every evening.    Yes [provider]  OMEPRAZOLE PO Take 1 tablet by mouth as needed.    Yes [provider]  azithromycin (ZITHROMAX) 250 MG tablet Take 2 tablets (500 mg) on  Day 1,  followed by 1 tablet (250 mg) once daily on Days 2 through 5. Patient not taking: Reported on 04/25/2018 01/15/18   Unk Pinto, MD  clindamycin (CLEOCIN) 300 MG capsule Take 1 capsule (300 mg total) by mouth 4 (four) times daily for 7 days. 04/25/18 05/02/18  Dorrell, Andree Elk, MD  losartan-hydrochlorothiazide (HYZAAR) 100-25 MG tablet Take 1 tablet by mouth daily. Patient not taking: Reported on 03/22/2018 07/20/17   [name], NP  oxyCODONE-acetaminophen (PERCOCET) 5-325 MG tablet Take 1 tablet by mouth every 4 (four) hours as needed for up to 10 doses for severe pain. 04/25/18   Dorrell, Andree Elk, MD  promethazine-dextromethorphan (PROMETHAZINE-DM) 6.25-15 MG/5ML syrup Take 1 to 2 tsp enery 4 hours if needed for cough Patient not taking: Reported on 04/25/2018 01/15/18   Unk Pinto, MD  pyrilamine-phenylephrine-dextromethorphan [facility] DM) 5-8.33-10 MG/5ML syrup Take 1 to 2 teaspoons every 4 hours if needed for severe cough Patient not taking: Reported on 04/17/2018 01/17/18   Unk Pinto, MD  ranitidine (ZANTAC) 300 MG tablet Take 1 to 2 tablets daily as needed for heartburn & reflux Patient not taking: Reported on 04/17/2018 11/07/17 11/07/18  Unk Pinto, MD    Family History Family History  Problem Relation Age of Onset  . Stroke Father   . Hypertension Father   . Other Mother        died at early age  . Stroke Sister   . Coronary artery disease Brother   . Hypertension Sister   .  Bladder Cancer Sister   . Colon cancer Neg Hx   . Stomach cancer Neg Hx     Social History Social History   Tobacco Use  . Smoking status: Former Smoker    Types: Cigarettes    Last attempt to quit: 10/02/1996    Years since quitting: 21.5  . Smokeless tobacco: Never Used  Substance Use Topics  . Alcohol use: Yes    Comment: glass of wine at least once a month.  . Drug use: No     Allergies   Codeine; Penicillins; Bactrim; Clarithromycin; Flagyl [metronidazole hcl]; Food; Pineapple; and Sulfa antibiotics   Review of Systems Review of Systems  Constitutional: Negative for chills and fever.  HENT: Negative for rhinorrhea and sore throat.   Eyes: Negative for visual disturbance.  Respiratory: Negative for cough and shortness  of breath.   Cardiovascular: Positive for leg swelling. Negative for chest pain.  Gastrointestinal: Negative for abdominal pain, nausea and vomiting.  Genitourinary: Negative for difficulty urinating and dysuria.  Musculoskeletal:       Endorses chronic R hip pain and new right foot pain.  Skin: Positive for rash. Negative for wound.  Neurological: Negative for dizziness and headaches.     Physical Exam Updated Vital Signs BP (!) 167/82   Pulse 84   Temp 98.4 F (36.9 C) (Oral)   Resp 18   SpO2 100%   Physical Exam  Constitutional: She is oriented to person, place, and time. She appears well-developed and well-nourished. She appears distressed.  HENT:  Head: Normocephalic and atraumatic.  Eyes: Pupils are equal, round, and reactive to light. EOM are normal.  Cardiovascular: Normal rate and regular rhythm. Exam reveals no gallop and no friction rub.  No murmur heard. Pulmonary/Chest: Effort normal and breath sounds normal. She has no wheezes. She has no rales.  Abdominal: Soft. Bowel sounds are normal.  Musculoskeletal:  Increased warmth, erythema, and edema of the right foot, most prominently over the dorsal metatarsals. Peripheral pulses  are intact. ROM is limited by pain. Sensation is normal.  Neurological: She is alert and oriented to person, place, and time.  Skin: Skin is warm and dry. Capillary refill takes less than 2 seconds.  Psychiatric: She has a normal mood and affect. Her behavior is normal.     ED Treatments / Results  Labs (all labs ordered are listed, but only abnormal results are displayed) Labs Reviewed  CBC WITH DIFFERENTIAL/PLATELET - Abnormal; Notable for the following components:      Result Value   RBC 3.39 (*)    Hemoglobin 9.4 (*)    HCT 28.6 (*)    Platelets 144 (*)    All other components within normal limits  BASIC METABOLIC PANEL - Abnormal; Notable for the following components:   Glucose, Bld 124 (*)    Creatinine, Ser 1.44 (*)    GFR calc non Af Amer 34 (*)    GFR calc Af Amer 39 (*)    All other components within normal limits  SEDIMENTATION RATE    EKG None  Radiology Dg Foot Complete Right  Result Date: 04/25/2018 CLINICAL DATA:  Progressive RIGHT foot pain and swelling after falling off of a curb 4 days ago. The foot is now warm to the touch. Subsequent encounter. EXAM: RIGHT FOOT COMPLETE - 3+ VIEW COMPARISON:  04/22/2018, 11/04/2016. FINDINGS: DORSAL soft tissue swelling, increased since the examination 3 days ago. No evidence of acute or subacute fracture or dislocation. Hallux valgus as noted previously. Mild osseous demineralization. Well-preserved joint spaces. Moderate-sized plantar calcaneal spur. IMPRESSION: 1. No acute or subacute osseous abnormality. 2. Increased DORSAL soft tissue swelling since the examination 3 days ago. 3. Mild osseous demineralization. 4. Plantar calcaneal spur. Electronically Signed   By: Evangeline Dakin M.D.   On: 04/25/2018 08:18    Procedures Procedures (including critical care time)  Medications Ordered in ED Medications  fentaNYL (SUBLIMAZE) injection 50 mcg (50 mcg Intravenous Refused 04/25/18 1052)  oxyCODONE-acetaminophen  (PERCOCET/ROXICET) 5-325 MG per tablet 1 tablet (1 tablet Oral Given 04/25/18 0856)  ibuprofen (ADVIL,MOTRIN) tablet 400 mg (400 mg Oral Given 04/25/18 0856)  clindamycin (CLEOCIN) capsule 300 mg (300 mg Oral Given 04/25/18 1051)     Initial Impression / Assessment and Plan / ED Course  I have reviewed the triage vital signs and the nursing notes.  Pertinent labs & imaging results that were available during my care of the patient were reviewed by me and considered in my medical decision making (see chart for details).  Susan [name] is a 77 y.o. female with a history of HTN, hypothyroidism, myelodysplasia, and arthritis who presents with right foot pain with associated swelling and edema that has worsened over the past 4 days after a traumatic fall. On arrival to the ED, pt is afebrile and hypertensive to 181/103. Physical exam is significant for increased warmth, erythema, and edema of the right foot, most prominently over the dorsal metatarsals with limited ROM due to pain, intact peripheral pulses, and intact sensation. DDx includes traumatic fracture, cellulitis, gout, or other inflammatory arthropathy. Labs are significant for normal white count and negative ESR. Xray shows increased dorsal soft tissue swelling since xray 3 days ago, but no fracture. Patient started on PO clindamycin for cellulitis. Pt was given percocet and later fentanyl for pain, and ice was applied to the site. On reassessment, pt reports pain improved and is able to ambulate to the bathroom. Patient medically cleared for discharge home with a 7-day course of PO clindamycin for cellulitis. Patient given a post-op boot and advised to use the boot plus her walker when weight-bearing. Patient advised to bear weight as tolerated, take either percocet or vicodin for pain at home (but not both), and return to the ED for worsening pain, progression of the erythema/edema, fever, or any other new or concerning symptoms. She has an  appointment to see her PCP tomorrow and will follow up at that time.   Final Clinical Impressions(s) / ED Diagnoses   Final diagnoses:  Cellulitis of right foot    ED Discharge Orders        Ordered    clindamycin (CLEOCIN) 300 MG capsule  4 times daily     04/25/18 1037    oxyCODONE-acetaminophen (PERCOCET) 5-325 MG tablet  Every 4 hours PRN     04/25/18 1037       Dorrell, Andree Elk, MD 04/25/18 1335    Mesner, Corene Cornea, MD 04/25/18 1700

## 2018-04-25 NOTE — Progress Notes (Signed)
Orthopedic Tech Progress Note Patient Details:  Susan [name] [birth year] [id number]  Ortho Devices Type of Ortho Device: Postop shoe/boot Ortho Device/Splint Location: rle Ortho Device/Splint Interventions: Application   Post Interventions Patient Tolerated: Well Instructions Provided: Care of device   Hildred Priest 04/25/2018, 11:08 AM

## 2018-04-26 DIAGNOSIS — L03115 Cellulitis of right lower limb: Secondary | ICD-10-CM | POA: Diagnosis not present

## 2018-04-26 DIAGNOSIS — M79671 Pain in right foot: Secondary | ICD-10-CM | POA: Diagnosis not present

## 2018-04-27 ENCOUNTER — Emergency Department (HOSPITAL_COMMUNITY): Payer: Medicare Other

## 2018-04-27 ENCOUNTER — Encounter (HOSPITAL_COMMUNITY): Payer: Self-pay | Admitting: Emergency Medicine

## 2018-04-27 ENCOUNTER — Emergency Department (HOSPITAL_COMMUNITY)
Admission: EM | Admit: 2018-04-27 | Discharge: 2018-04-27 | Disposition: A | Discharge status: Home / Self Care | Payer: Medicare Other | Attending: Emergency Medicine | Admitting: Emergency Medicine

## 2018-04-27 DIAGNOSIS — E039 Hypothyroidism, unspecified: Secondary | ICD-10-CM | POA: Diagnosis not present

## 2018-04-27 DIAGNOSIS — M7989 Other specified soft tissue disorders: Secondary | ICD-10-CM | POA: Diagnosis not present

## 2018-04-27 DIAGNOSIS — M109 Gout, unspecified: Secondary | ICD-10-CM | POA: Diagnosis not present

## 2018-04-27 DIAGNOSIS — N183 Chronic kidney disease, stage 3 (moderate): Secondary | ICD-10-CM | POA: Insufficient documentation

## 2018-04-27 DIAGNOSIS — Z87891 Personal history of nicotine dependence: Secondary | ICD-10-CM | POA: Insufficient documentation

## 2018-04-27 DIAGNOSIS — I129 Hypertensive chronic kidney disease with stage 1 through stage 4 chronic kidney disease, or unspecified chronic kidney disease: Secondary | ICD-10-CM | POA: Insufficient documentation

## 2018-04-27 DIAGNOSIS — Z7982 Long term (current) use of aspirin: Secondary | ICD-10-CM | POA: Insufficient documentation

## 2018-04-27 DIAGNOSIS — Z79899 Other long term (current) drug therapy: Secondary | ICD-10-CM | POA: Insufficient documentation

## 2018-04-27 DIAGNOSIS — M79671 Pain in right foot: Secondary | ICD-10-CM | POA: Diagnosis not present

## 2018-04-27 DIAGNOSIS — S99921A Unspecified injury of right foot, initial encounter: Secondary | ICD-10-CM | POA: Diagnosis not present

## 2018-04-27 LAB — BASIC METABOLIC PANEL
Anion gap: 9 (ref 5–15)
BUN: 26 mg/dL — ABNORMAL HIGH (ref 8–23)
CO2: 25 mmol/L (ref 22–32)
Calcium: 9.7 mg/dL (ref 8.9–10.3)
Chloride: 107 mmol/L (ref 98–111)
Creatinine, Ser: 1.54 mg/dL — ABNORMAL HIGH (ref 0.44–1.00)
GFR calc non Af Amer: 31 mL/min — ABNORMAL LOW (ref >60)
GFR, EST AFRICAN AMERICAN: 36 mL/min — AB (ref >60)
Glucose, Bld: 107 mg/dL — ABNORMAL HIGH (ref 70–99)
Potassium: 4.1 mmol/L (ref 3.5–5.1)
Sodium: 141 mmol/L (ref 135–145)

## 2018-04-27 LAB — CBC WITH DIFFERENTIAL/PLATELET
Basophils Absolute: 0 10*3/uL (ref 0.0–0.1)
Basophils Relative: 0 %
EOS ABS: 0 10*3/uL (ref 0.0–0.7)
Eosinophils Relative: 1 %
HCT: 27.5 % — ABNORMAL LOW (ref 36.0–46.0)
Hemoglobin: 9.4 g/dL — ABNORMAL LOW (ref 12.0–15.0)
LYMPHS ABS: 1.7 10*3/uL (ref 0.7–4.0)
Lymphocytes Relative: 23 %
MCH: 28.1 pg (ref 26.0–34.0)
MCHC: 34.2 g/dL (ref 30.0–36.0)
MCV: 82.1 fL (ref 78.0–100.0)
MONO ABS: 1 10*3/uL (ref 0.1–1.0)
MONOS PCT: 14 %
Neutro Abs: 4.5 10*3/uL (ref 1.7–7.7)
Neutrophils Relative %: 62 %
PLATELETS: 158 10*3/uL (ref 150–400)
RBC: 3.35 MIL/uL — ABNORMAL LOW (ref 3.87–5.11)
RDW: 15.3 % (ref 11.5–15.5)
WBC: 7.3 10*3/uL (ref 4.0–10.5)

## 2018-04-27 LAB — URIC ACID: Uric Acid, Serum: 8.5 mg/dL — ABNORMAL HIGH (ref 2.5–7.1)

## 2018-04-27 MED ORDER — PREDNISONE 20 MG PO TABS
60.0000 mg | ORAL_TABLET | Freq: Once | ORAL | Status: AC
  Administered 2018-04-27: 60 mg via ORAL
  Filled 2018-04-27: qty 3

## 2018-04-27 MED ORDER — OXYCODONE-ACETAMINOPHEN 5-325 MG PO TABS
1.0000 | ORAL_TABLET | Freq: Once | ORAL | Status: AC
  Administered 2018-04-27: 1 via ORAL
  Filled 2018-04-27: qty 1

## 2018-04-27 MED ORDER — PREDNISONE 10 MG (21) PO TBPK: ORAL_TABLET | Freq: Every day | ORAL | 0 refills | Status: DC

## 2018-04-27 NOTE — ED Provider Notes (Signed)
Reevaluation.   Pt reports she wants to go home.  Pt able to stand and moves slowly.  Pt advised that I have done a face to face and consulted social work to get home health to see her.  Pt agrees to plan   Sidney Ace 04/27/18 1709    Quintella Reichert, MD 05/02/18 [phone]

## 2018-04-27 NOTE — ED Triage Notes (Signed)
Patient here from home where she lives alone with complaints of right foot pain. Reports being seen at [facility] twice for same and seen at PCP office this morning. States that she fell last Saturday and has had foot pain since.

## 2018-04-27 NOTE — Discharge Instructions (Addendum)
Today you were diagnosed with acute flare of gout.I have prescribed some steroids this should help with reducing the swelling and pain. Please continue to take your antibiotics to further prevent infection.Please follow up with your PCP in 1 week.If you experience any chest pain or shortness of breath. Please return to the ED if you experience any of the following symptoms: You have severe or uncontrolled pain. You cannot urinate.

## 2018-04-27 NOTE — Progress Notes (Signed)
CSW spoke to CN, pt is ready to go home and wants [facility].  CN will place a consult for CM with an order for face-to-face and an order for RN/Aide/PT/T and Social Work.  Please reconsult if future social work needs arise.  CSW signing off, as social work intervention is no longer needed.  Alphonse Guild. Anjenette Gerbino, LCSW, LCAS, CSI Clinical Social Worker Ph: [phone]

## 2018-04-27 NOTE — ED Provider Notes (Signed)
[name] DEPT Provider Note   CSN: [id number] Arrival date & time: 04/27/18  3536     History   Chief Complaint Chief Complaint  Patient presents with  . Foot Pain    HPI Susan [name] is a 77 y.o. female.  77 year old female with a past medical history of hyperlipidemia, hypertension, hypothyroidism, myelodysplasia presents to the ED today with a chief complaint of right leg pain.  Patient originally had a fall last Saturday at church, she was seen on Sunday in the ED [facility] for right foot swelling.  She was placed on a postop shoe.  She was additionally seen in the ED on Monday due to her foot swelling worsening. She was referred to orthopedics which she attended that appointment on Tuesday.Per patient orthopedics wanted her to come to the ED to get further workup for her right foot swelling.  She states the pain is so severe on her right foot that she cannot ambulate on it.  She was given a walking shoe for comfort and states there is swelling on her foot now and she is not able to Velcro the shoe.  Describes the pain as sharp mainly around the top of her foot and bottom of her foot.  Patient states the pain is worse to the touch, she states nothing makes this pain better.  Patient denies any fever, urinary symptoms, chest pain or shortness of breath.     Past Medical History:  Diagnosis Date  . Anemia   . Arthritis   . Blood transfusion without reported diagnosis   . GERD (gastroesophageal reflux disease)   . HLD (hyperlipidemia)   . Hypertension   . Hypothyroidism   . Myelodysplasia   . Vitamin D deficiency     Patient Active Problem List   Diagnosis Date Noted  . 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 BMI 29.0-29.9,adult 09/02/2015  . Encounter for Medicare annual wellness exam 06/02/2015  . 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[place]) 11/03/2014  . CKD, 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) 11/02/2014  . Medication management 01/27/2014  .  Hyperlipidemia 08/27/2013  . Essential hypertension   . Hypothyroidism   . GERD (gastroesophageal reflux disease)   . Vitamin D deficiency   . Hemorrhoids 07/15/2013    Past Surgical History:  Procedure Laterality Date  . CARPAL TUNNEL RELEASE Right   . DILATION AND CURETTAGE OF UTERUS    . INGUINAL HERNIA REPAIR    . ROTATOR CUFF REPAIR Left   . TRANSANAL HEMORRHOIDAL DEARTERIALIZATION N/A 03/31/2017   Procedure: TRANSANAL HEMORRHOIDAL DEARTERIALIZATION;  Surgeon: Leighton Ruff, MD;  Location: [facility];  Service: General;  Laterality: N/A;  . UMBILICAL HERNIA REPAIR       OB History   None      Home Medications    Prior to Admission medications   Medication Sig Start Date End Date Taking? Authorizing Provider  acetaminophen (TYLENOL) 500 MG tablet Take 500-1,000 mg by mouth every 6 (six) hours as needed (for pain.).   Yes [provider]  amLODipine (NORVASC) 10 MG tablet TAKE 1/2 TABLET BY MOUTH DAILY AT BEDTIME; (TAKE 1 TABLET IF SBP IS >150) Patient taking differently: Take 10 mg by mouth at bedtime. (TAKE 1 TABLET IF SBP IS >150) 07/20/17  Yes [name], NP  aspirin 81 MG chewable tablet Chew 81 mg by mouth daily.    Yes [provider]  Cholecalciferol (VITAMIN D-3) 5000 UNITS TABS Take 15,000 Units by mouth daily.  Yes [provider]  clindamycin (CLEOCIN) 300 MG capsule Take 1 capsule (300 mg total) by mouth 4 (four) times daily for 7 days. 04/25/18 05/02/18 Yes Dorrell, Andree Elk, MD  Darbepoetin Alfa (ARANESP, ALBUMIN FREE,) 300 MCG/ML SOLN Inject 300 mcg into the skin See admin instructions. USE INJECTION EVERY 3 WEEKS AS NEEDED FOR HEMOGLOBIN LESS THAN 11.   Yes [provider]  diclofenac (VOLTAREN) 75 MG EC tablet Take 75 mg by mouth daily. 02/05/18  Yes [provider]  diphenhydrAMINE (BENADRYL) 25 mg capsule One capsule every 6 hours x 3 days, then as needed for symptoms of rash/itching 03/25/17  Yes Upstill, Nehemiah Settle,  PA-C  HYDROcodone-acetaminophen (NORCO/VICODIN) 5-325 MG tablet Take 1 tablet by mouth every 6 (six) hours as needed for moderate pain. 03/22/18  Yes [name], NP  hydroxypropyl methylcellulose / hypromellose (ISOPTO TEARS / GONIOVISC) 2.5 % ophthalmic solution Place 1-2 drops into both eyes 3 (three) times daily as needed for dry eyes.   Yes [provider]  labetalol (NORMODYNE) 300 MG tablet TAKE 1 TABLET BY MOUTH TWICE DAILY 02/01/18  Yes Vicie Mutters, PA-C  levothyroxine (SYNTHROID, LEVOTHROID) 100 MCG tablet TAKE 1 TABLET BY MOUTH DAILY ON AN EMPTY STOMACH FOR 30 MINUTES 11/24/17  Yes Unk Pinto, MD  Magnesium 500 MG TABS Take 500 mg by mouth every evening.    Yes [provider]  OMEPRAZOLE PO Take 1 tablet by mouth as needed.    Yes [provider]  oxyCODONE-acetaminophen (PERCOCET) 5-325 MG tablet Take 1 tablet by mouth every 4 (four) hours as needed for up to 10 doses for severe pain. 04/25/18  Yes Dorrell, Andree Elk, MD  predniSONE (STERAPRED UNI-PAK 21 TAB) 10 MG (21) TBPK tablet Take by mouth daily. Take 6 tabs by mouth daily  for 2 days, then 5 tabs for 2 days, then 4 tabs for 2 days, then 3 tabs for 2 days, 2 tabs for 2 days, then 1 tab by mouth daily for 2 days 04/27/18   Janeece Fitting, PA-C  ranitidine (ZANTAC) 300 MG tablet Take 1 to 2 tablets daily as needed for heartburn & reflux Patient not taking: Reported on 04/17/2018 11/07/17 11/07/18  Unk Pinto, MD    Family History Family History  Problem Relation Age of Onset  . Stroke Father   . Hypertension Father   . Other Mother        died at early age  . Stroke Sister   . Coronary artery disease Brother   . Hypertension Sister   . Bladder Cancer Sister   . Colon cancer Neg Hx   . Stomach cancer Neg Hx     Social History Social History   Tobacco Use  . Smoking status: Former Smoker    Types: Cigarettes    Last attempt to quit: 10/02/1996    Years since quitting: 21.5  .  Smokeless tobacco: Never Used  Substance Use Topics  . Alcohol use: Yes    Comment: glass of wine at least once a month.  . Drug use: No     Allergies   Codeine; Penicillins; Bactrim; Clarithromycin; Flagyl [metronidazole hcl]; Food; Pineapple; and Sulfa antibiotics   Review of Systems Review of Systems  Constitutional: Negative for chills and fever.  HENT: Negative for ear pain and sore throat.   Eyes: Negative for pain and visual disturbance.  Respiratory: Negative for cough and shortness of breath.   Cardiovascular: Negative for chest pain and palpitations.  Gastrointestinal: Negative for  abdominal pain and vomiting.  Genitourinary: Negative for dysuria, flank pain and hematuria.  Musculoskeletal: Positive for joint swelling and myalgias. Negative for arthralgias and back pain.  Skin: Negative for color change and rash.  Neurological: Negative for seizures and syncope.  All other systems reviewed and are negative.    Physical Exam Updated Vital Signs BP (!) 173/83   Pulse 73   Temp 98.5 F (36.9 C) (Oral)   Resp 16   SpO2 100%   Physical Exam  Constitutional: She is oriented to person, place, and time. She appears well-developed and well-nourished. No distress.  HENT:  Head: Normocephalic and atraumatic.  Mouth/Throat: Oropharynx is clear and moist. No oropharyngeal exudate.  Eyes: Pupils are equal, round, and reactive to light.  Neck: Normal range of motion.  Cardiovascular: Regular rhythm and normal heart sounds.  Pulmonary/Chest: Effort normal and breath sounds normal. No respiratory distress.  Abdominal: Soft. Bowel sounds are normal. She exhibits no distension. There is no tenderness.  Musculoskeletal: She exhibits no deformity.       Right lower leg: She exhibits no edema.       Left lower leg: She exhibits no edema.       Right foot: There is decreased range of motion, tenderness and swelling. There is no deformity and no laceration.  Right foot shows  significantly swelling, erythema, edema around the dorsum area of the foot.  Pulses are present.Neurovascular intact.   Neurological: She is alert and oriented to person, place, and time.  Skin: Skin is warm and dry. Capillary refill takes less than 2 seconds.  Psychiatric: She has a normal mood and affect.  Nursing note and vitals reviewed.        ED Treatments / Results  Labs (all labs ordered are listed, but only abnormal results are displayed) Labs Reviewed  URIC ACID - Abnormal; Notable for the following components:      Result Value   Uric Acid, Serum 8.5 (*)    All other components within normal limits  CBC WITH DIFFERENTIAL/PLATELET - Abnormal; Notable for the following components:   RBC 3.35 (*)    Hemoglobin 9.4 (*)    HCT 27.5 (*)    All other components within normal limits  BASIC METABOLIC PANEL - Abnormal; Notable for the following components:   Glucose, Bld 107 (*)    BUN 26 (*)    Creatinine, Ser 1.54 (*)    GFR calc non Af Amer 31 (*)    GFR calc Af Amer 36 (*)    All other components within normal limits    EKG None  Radiology Mr Foot Right Wo Contrast  Result Date: 04/27/2018 CLINICAL DATA:  Progressively worsening foot pain since fall 6 days ago. EXAM: MRI OF THE RIGHT FOREFOOT WITHOUT CONTRAST TECHNIQUE: Multiplanar, multisequence MR imaging of the right forefoot was performed. No intravenous contrast was administered. COMPARISON:  Right foot x-rays dated April 25, 2018. FINDINGS: Bones/Joint/Cartilage No marrow signal abnormality. No fracture or dislocation. Mild hallux valgus deformity with associated mild first MTP joint osteoarthritis. No joint effusion. Ligaments Collateral ligaments are intact.  Lisfranc ligament is intact. Muscles and Tendons Flexor, peroneal and extensor compartment tendons are intact. Mild increased T2 signal within the intrinsic muscles of the forefoot, nonspecific. No significant muscle atrophy. Soft tissue Prominent soft tissue  swelling over the dorsal lateral forefoot extending into the hindfoot. No fluid collection or hematoma. No soft tissue mass. IMPRESSION: 1. No acute osseous abnormality. 2. Prominent dorsolateral forefoot soft  tissue swelling extending into the hindfoot. No drainable fluid collection. 3. Mild hallux valgus deformity with associated first MTP joint osteoarthritis. Electronically Signed   By: Titus Dubin M.D.   On: 04/27/2018 15:28    Procedures Procedures (including critical care time)  Medications Ordered in ED Medications  oxyCODONE-acetaminophen (PERCOCET/ROXICET) 5-325 MG per tablet 1 tablet (1 tablet Oral Given 04/27/18 1146)     Initial Impression / Assessment and Plan / ED Course  I have reviewed the triage vital signs and the nursing notes.  Pertinent labs & imaging results that were available during my care of the patient were reviewed by me and considered in my medical decision making (see chart for details).    Patient was seen in the ED multiple times for foot swelling. Xray revealed no acute fracture.  Follow-up with orthopedist orthopedist states there is no fracture acutely present.  Orthopedics wanted patient to come back to the ED to get uric acid level drawn. Level showed elevated 8.5. An MRI was obtained to r/o further infection.MRI showed acute fracture, drainable fluid collection, or osseous abnormality. Due to this patient's presentation and physical examination, I believe she is having a acute gout flare up. Patient is currently taking antibiotics for cellulitis.Dr. Ralene Bathe has seen this patient and agrees with my plan and management.  Patient states she cannot go home at this time without home health. Social worker consult has been placed for home health. Patient is currently comfortable.   4:05 PM Care transferred to Alyse Low PA at shift change.   Final Clinical Impressions(s) / ED Diagnoses   Final diagnoses:  Acute gout of right ankle, unspecified cause     ED Discharge Orders        Ordered    predniSONE (STERAPRED UNI-PAK 21 TAB) 10 MG (21) TBPK tablet  Daily     04/27/18 1539       Janeece Fitting, PA-C 04/27/18 1607    Quintella Reichert, MD 05/02/18 1600

## 2018-04-27 NOTE — ED Notes (Signed)
Patient transported to MRI 

## 2018-05-03 ENCOUNTER — Inpatient Hospital Stay: Payer: Medicare Other

## 2018-05-03 ENCOUNTER — Telehealth: Payer: Self-pay | Admitting: Oncology

## 2018-05-03 ENCOUNTER — Inpatient Hospital Stay: Payer: Medicare Other | Attending: Oncology | Admitting: Oncology

## 2018-05-03 VITALS — BP 166/56 | HR 63 | Temp 98.2°F | Resp 18 | Ht 62.0 in | Wt 159.9 lb

## 2018-05-03 DIAGNOSIS — N189 Chronic kidney disease, unspecified: Secondary | ICD-10-CM

## 2018-05-03 DIAGNOSIS — Z7982 Long term (current) use of aspirin: Secondary | ICD-10-CM | POA: Insufficient documentation

## 2018-05-03 DIAGNOSIS — D631 Anemia in chronic kidney disease: Secondary | ICD-10-CM

## 2018-05-03 DIAGNOSIS — Z79899 Other long term (current) drug therapy: Secondary | ICD-10-CM | POA: Diagnosis not present

## 2018-05-03 DIAGNOSIS — D500 Iron deficiency anemia secondary to blood loss (chronic): Secondary | ICD-10-CM

## 2018-05-03 DIAGNOSIS — D469 Myelodysplastic syndrome, unspecified: Secondary | ICD-10-CM

## 2018-05-03 LAB — CBC WITH DIFFERENTIAL/PLATELET
BASOS ABS: 0 10*3/uL (ref 0.0–0.1)
BASOS PCT: 0 %
Eosinophils Absolute: 0 10*3/uL (ref 0.0–0.5)
Eosinophils Relative: 0 %
HEMATOCRIT: 29.1 % — AB (ref 34.8–46.6)
HEMOGLOBIN: 9.6 g/dL — AB (ref 11.6–15.9)
Lymphocytes Relative: 26 %
Lymphs Abs: 1 10*3/uL (ref 0.9–3.3)
MCH: 27.4 pg (ref 25.1–34.0)
MCHC: 33 g/dL (ref 31.5–36.0)
MCV: 83.1 fL (ref 79.5–101.0)
MONOS PCT: 3 %
Monocytes Absolute: 0.1 10*3/uL (ref 0.1–0.9)
NEUTROS ABS: 2.8 10*3/uL (ref 1.5–6.5)
Neutrophils Relative %: 71 %
Platelets: 174 10*3/uL (ref 145–400)
RBC: 3.5 MIL/uL — ABNORMAL LOW (ref 3.70–5.45)
RDW: 16 % — ABNORMAL HIGH (ref 11.2–14.5)
WBC: 3.9 10*3/uL (ref 3.9–10.3)

## 2018-05-03 MED ORDER — DARBEPOETIN ALFA 300 MCG/0.6ML IJ SOSY: 300.0000 ug | PREFILLED_SYRINGE | INTRAMUSCULAR | Status: DC

## 2018-05-03 MED ORDER — DARBEPOETIN ALFA 300 MCG/0.6ML IJ SOSY
PREFILLED_SYRINGE | INTRAMUSCULAR | Status: AC
  Filled 2018-05-03: qty 0.6

## 2018-05-03 NOTE — Progress Notes (Signed)
Hematology and Oncology Follow Up Visit  Susan [name] [id number] 1941/06/25 77 y.o. 05/03/2018 8:56 AM Unk Pinto, MDMcKeown, [name], MD   Principle Diagnosis:  77 year old woman with multifactorial anemia diagnosed in 2010.  She has element of chronic renal insufficiency as well as iron deficiency.    Current therapy: She is on Aranesp 300 mcg every 3 weeks to keep her hemoglobin above 11.   Interim History:  Susan [name] presents today for a follow-up.  Since her last visit, she reports no major complaints.  She was diagnosed with gout in the last 2 weeks which involved her right big toe.  She did have an MRI as well which did not show any osteomyelitis or injuries.  She has been on prednisone which have helped her symptoms.  She continues to tolerate Aranesp without any complications.  She does report improvement in her overall performance status and activity level on this treatment.  She denies any falls or syncope.  She denies any pathological fractures.  She does not report any headaches, blurry vision, syncope or seizures.  He denies any alteration mental status or confusion.  He has not reported any fevers or chills or sweats. She has not reported any chest pain or palpitation. Does not report any shortness of breath or wheezing. She does not report any abdominal pain or early satiety.  She denies any constipation or diarrhea.  She does not report any frequency urgency or hesitancy.  She does not report any bone pain or arthralgias.  Rest of her review of systems is negative.   Medications: I have reviewed the patient's current medications.  Current Outpatient Medications  Medication Sig Dispense Refill  . acetaminophen (TYLENOL) 500 MG tablet Take 500-1,000 mg by mouth every 6 (six) hours as needed (for pain.).    [name] amLODipine (NORVASC) 10 MG tablet TAKE 1/2 TABLET BY MOUTH DAILY AT BEDTIME; (TAKE 1 TABLET IF SBP IS >150) (Patient taking differently: Take 10 mg by mouth at bedtime.  (TAKE 1 TABLET IF SBP IS >150)) 90 tablet 0  . aspirin 81 MG chewable tablet Chew 81 mg by mouth daily.     . Cholecalciferol (VITAMIN D-3) 5000 UNITS TABS Take 15,000 Units by mouth daily.     . Darbepoetin Alfa (ARANESP, ALBUMIN FREE,) 300 MCG/ML SOLN Inject 300 mcg into the skin See admin instructions. USE INJECTION EVERY 3 WEEKS AS NEEDED FOR HEMOGLOBIN LESS THAN 11.    . diclofenac (VOLTAREN) 75 MG EC tablet Take 75 mg by mouth daily.  1  . diphenhydrAMINE (BENADRYL) 25 mg capsule One capsule every 6 hours x 3 days, then as needed for symptoms of rash/itching 30 capsule 0  . HYDROcodone-acetaminophen (NORCO/VICODIN) 5-325 MG tablet Take 1 tablet by mouth every 6 (six) hours as needed for moderate pain. 20 tablet 0  . hydroxypropyl methylcellulose / hypromellose (ISOPTO TEARS / GONIOVISC) 2.5 % ophthalmic solution Place 1-2 drops into both eyes 3 (three) times daily as needed for dry eyes.    [name] labetalol (NORMODYNE) 300 MG tablet TAKE 1 TABLET BY MOUTH TWICE DAILY 180 tablet 0  . levothyroxine (SYNTHROID, LEVOTHROID) 100 MCG tablet TAKE 1 TABLET BY MOUTH DAILY ON AN EMPTY STOMACH FOR 30 MINUTES 90 tablet 1  . Magnesium 500 MG TABS Take 500 mg by mouth every evening.     [name] OMEPRAZOLE PO Take 1 tablet by mouth as needed.     [name] oxyCODONE-acetaminophen (PERCOCET) 5-325 MG tablet Take 1 tablet by mouth every 4 (four)  hours as needed for up to 10 doses for severe pain. 10 tablet 0  . predniSONE (STERAPRED UNI-PAK 21 TAB) 10 MG (21) TBPK tablet Take by mouth daily. Take 6 tabs by mouth daily  for 2 days, then 5 tabs for 2 days, then 4 tabs for 2 days, then 3 tabs for 2 days, 2 tabs for 2 days, then 1 tab by mouth daily for 2 days 42 tablet 0  . ranitidine (ZANTAC) 300 MG tablet Take 1 to 2 tablets daily as needed for heartburn & reflux (Patient not taking: Reported on 04/17/2018) 180 tablet 3   No current facility-administered medications for this visit.      Past Medical History, Surgical history,  Social history, and Family History personally reviewed again today and unchanged.  [name]  Physical Exam: Blood pressure (!) 166/56, pulse 63, temperature 98.2 F (36.8 C), temperature source Oral, resp. rate 18, height 5\' 2"  (1.575 m), weight 159 lb 14.4 oz (72.5 kg), SpO2 100 %.   ECOG: 1 General appearance: Well-appearing woman without distress. Head: Atraumatic without abnormalities. Oropharynx: Mucous membranes are moist and pink. Eyes: Sclera anicteric.  Pupils are equal and round reactive to light. Lymph nodes: No lymphadenopathy noted in the cervical, supraclavicular, or axillary nodes  Heart:regular rate and rhythm, there are any murmurs or gallops. Lung: Clear in all lung fields without any wheezes or dullness to percussion. Abdomen: Soft, nontender with any rebound or guarding. Musculoskeletal: No joint deformity or effusion. Skin: No skin rashes or lesions.  Lab Results: Lab Results  Component Value Date   WBC 7.3 04/27/2018   HGB 9.4 (L) 04/27/2018   HCT 27.5 (L) 04/27/2018   MCV 82.1 04/27/2018   PLT 158 04/27/2018     Chemistry      Component Value Date/Time   NA 141 04/27/2018 1102   NA 143 06/30/2017 0846   K 4.1 04/27/2018 1102   K 4.4 06/30/2017 0846   CL 107 04/27/2018 1102   CO2 25 04/27/2018 1102   CO2 24 06/30/2017 0846   BUN 26 (H) 04/27/2018 1102   BUN 26.5 (H) 06/30/2017 0846   CREATININE 1.54 (H) 04/27/2018 1102   CREATININE 1.52 (H) 03/22/2018 0925   CREATININE 1.4 (H) 06/30/2017 0846      Component Value Date/Time   CALCIUM 9.7 04/27/2018 1102   CALCIUM 10.1 06/30/2017 0846   ALKPHOS 101 06/30/2017 0846   AST 16 03/22/2018 0925   AST 17 06/30/2017 0846   ALT 10 03/22/2018 0925   ALT <6 06/30/2017 0846   BILITOT 0.7 03/22/2018 0925   BILITOT 0.76 06/30/2017 0846      Impression and Plan:   77 year old female with: 1. Anemia: Multifactorial in nature with renal disease as well as iron deficiency.  She is currently receiving Aranesp  every 3 weeks with reasonable response to therapy.  Her iron studies obtained in March 2019 showed no evidence of iron deficiency and will continue to monitor periodically.  The natural course of her disease and risks and benefits of continuing Aranesp was discussed today.  He is agreeable to continue at this time and we will check her iron periodically and replace as needed. 2. Gout: Involving her right toe and improving currently on prednisone. 3. Follow up. Every 3 weeks for CBC and injection. Clinical visit in 6 months.    15  minutes was spent with the patient face-to-face today.  More than 50% of time was dedicated to discussing the natural course of this  disease as well as risks and benefits of treatment and alternative treatment options.         Zola Button MD 8/1/20198:56 AM

## 2018-05-03 NOTE — Telephone Encounter (Signed)
Scheduled appt per 8/1 los - gave patient AVS and calender per los.  

## 2018-05-11 ENCOUNTER — Inpatient Hospital Stay: Payer: Medicare Other

## 2018-05-11 ENCOUNTER — Telehealth: Payer: Self-pay

## 2018-05-11 VITALS — BP 135/75 | HR 64 | Temp 98.3°F | Resp 20

## 2018-05-11 DIAGNOSIS — D469 Myelodysplastic syndrome, unspecified: Secondary | ICD-10-CM

## 2018-05-11 DIAGNOSIS — D649 Anemia, unspecified: Secondary | ICD-10-CM

## 2018-05-11 DIAGNOSIS — Z7982 Long term (current) use of aspirin: Secondary | ICD-10-CM | POA: Diagnosis not present

## 2018-05-11 DIAGNOSIS — N189 Chronic kidney disease, unspecified: Secondary | ICD-10-CM | POA: Diagnosis not present

## 2018-05-11 DIAGNOSIS — D631 Anemia in chronic kidney disease: Secondary | ICD-10-CM | POA: Diagnosis not present

## 2018-05-11 DIAGNOSIS — Z79899 Other long term (current) drug therapy: Secondary | ICD-10-CM | POA: Diagnosis not present

## 2018-05-11 LAB — CBC WITH DIFFERENTIAL/PLATELET
BASOS ABS: 0 10*3/uL (ref 0.0–0.1)
Basophils Relative: 0 %
EOS PCT: 1 %
Eosinophils Absolute: 0.1 10*3/uL (ref 0.0–0.5)
HCT: 29.2 % — ABNORMAL LOW (ref 34.8–46.6)
HEMOGLOBIN: 9.5 g/dL — AB (ref 11.6–15.9)
LYMPHS PCT: 25 %
Lymphs Abs: 1.3 10*3/uL (ref 0.9–3.3)
MCH: 27.7 pg (ref 25.1–34.0)
MCHC: 32.5 g/dL (ref 31.5–36.0)
MCV: 85.1 fL (ref 79.5–101.0)
Monocytes Absolute: 0.7 10*3/uL (ref 0.1–0.9)
Monocytes Relative: 13 %
NEUTROS PCT: 61 %
Neutro Abs: 3.4 10*3/uL (ref 1.5–6.5)
PLATELETS: 98 10*3/uL — AB (ref 145–400)
RBC: 3.43 MIL/uL — AB (ref 3.70–5.45)
RDW: 16.5 % — ABNORMAL HIGH (ref 11.2–14.5)
WBC: 5.5 10*3/uL (ref 3.9–10.3)

## 2018-05-11 MED ORDER — DARBEPOETIN ALFA 300 MCG/0.6ML IJ SOSY
300.0000 ug | PREFILLED_SYRINGE | INTRAMUSCULAR | Status: DC
  Administered 2018-05-11: 300 ug via SUBCUTANEOUS

## 2018-05-11 NOTE — Patient Instructions (Signed)
Darbepoetin Alfa injection What is this medicine? DARBEPOETIN ALFA (dar be POE e tin AL fa) helps your body make more red blood cells. It is used to treat anemia caused by chronic kidney failure and chemotherapy. This medicine may be used for other purposes; ask your health care provider or pharmacist if you have questions. COMMON BRAND NAME(S): Aranesp What should I tell my health care provider before I take this medicine? They need to know if you have any of these conditions: -blood clotting disorders or history of blood clots -cancer patient not on chemotherapy -cystic fibrosis -heart disease, such as angina, heart failure, or a history of a heart attack -hemoglobin level of 12 g/dL or greater -high blood pressure -low levels of folate, iron, or vitamin B12 -seizures -an unusual or allergic reaction to darbepoetin, erythropoietin, albumin, hamster proteins, latex, other medicines, foods, dyes, or preservatives -pregnant or trying to get pregnant -breast-feeding How should I use this medicine? This medicine is for injection into a vein or under the skin. It is usually given by a health care professional in a hospital or clinic setting. If you get this medicine at home, you will be taught how to prepare and give this medicine. Do not shake the solution before you withdraw a dose. Use exactly as directed. Take your medicine at regular intervals. Do not take your medicine more often than directed. It is important that you put your used needles and syringes in a special sharps container. Do not put them in a trash can. If you do not have a sharps container, call your pharmacist or healthcare provider to get one. Talk to your pediatrician regarding the use of this medicine in children. While this medicine may be used in children as young as 1 year for selected conditions, precautions do apply. Overdosage: If you think you have taken too much of this medicine contact a poison control center or  emergency room at once. NOTE: This medicine is only for you. Do not share this medicine with others. What if I miss a dose? If you miss a dose, take it as soon as you can. If it is almost time for your next dose, take only that dose. Do not take double or extra doses. What may interact with this medicine? Do not take this medicine with any of the following medications: -epoetin alfa This list may not describe all possible interactions. Give your health care provider a list of all the medicines, herbs, non-prescription drugs, or dietary supplements you use. Also tell them if you smoke, drink alcohol, or use illegal drugs. Some items may interact with your medicine. What should I watch for while using this medicine? Visit your prescriber or health care professional for regular checks on your progress and for the needed blood tests and blood pressure measurements. It is especially important for the doctor to make sure your hemoglobin level is in the desired range, to limit the risk of potential side effects and to give you the best benefit. Keep all appointments for any recommended tests. Check your blood pressure as directed. Ask your doctor what your blood pressure should be and when you should contact him or her. As your body makes more red blood cells, you may need to take iron, folic acid, or vitamin B supplements. Ask your doctor or health care provider which products are right for you. If you have kidney disease continue dietary restrictions, even though this medication can make you feel better. Talk with your doctor or health   care professional about the foods you eat and the vitamins that you take. What side effects may I notice from receiving this medicine? Side effects that you should report to your doctor or health care professional as soon as possible: -allergic reactions like skin rash, itching or hives, swelling of the face, lips, or tongue -breathing problems -changes in vision -chest  pain -confusion, trouble speaking or understanding -feeling faint or lightheaded, falls -high blood pressure -muscle aches or pains -pain, swelling, warmth in the leg -rapid weight gain -severe headaches -sudden numbness or weakness of the face, arm or leg -trouble walking, dizziness, loss of balance or coordination -seizures (convulsions) -swelling of the ankles, feet, hands -unusually weak or tired Side effects that usually do not require medical attention (report to your doctor or health care professional if they continue or are bothersome): -diarrhea -fever, chills (flu-like symptoms) -headaches -nausea, vomiting -redness, stinging, or swelling at site where injected This list may not describe all possible side effects. Call your doctor for medical advice about side effects. You may report side effects to FDA at 1-800-FDA-1088. Where should I keep my medicine? Keep out of the reach of children. Store in a refrigerator between 2 and 8 degrees C (36 and 46 degrees F). Do not freeze. Do not shake. Throw away any unused portion if using a single-dose vial. Throw away any unused medicine after the expiration date. NOTE: This sheet is a summary. It may not cover all possible information. If you have questions about this medicine, talk to your doctor, pharmacist, or health care provider.  2015, [employer]. (2008-09-02 10:23:57)  

## 2018-05-11 NOTE — Telephone Encounter (Signed)
Patient stop by to pick up new appointment. Per 8/9 walk ins

## 2018-05-22 ENCOUNTER — Ambulatory Visit (INDEPENDENT_AMBULATORY_CARE_PROVIDER_SITE_OTHER): Payer: Medicare Other | Admitting: Orthopaedic Surgery

## 2018-05-22 ENCOUNTER — Encounter (INDEPENDENT_AMBULATORY_CARE_PROVIDER_SITE_OTHER): Payer: Self-pay | Admitting: Orthopaedic Surgery

## 2018-05-22 DIAGNOSIS — M1611 Unilateral primary osteoarthritis, right hip: Secondary | ICD-10-CM | POA: Diagnosis not present

## 2018-05-22 NOTE — Progress Notes (Signed)
Office Visit Note   Patient: Susan [name]           Date of Birth: 16-Dec-1940           MRN: [id number] Visit Date: 05/22/2018              Requested by: [name], NP [address] [place], [place] [address] PCP: Unk Pinto, MD   Assessment & Plan: Visit Diagnoses:  1. Primary osteoarthritis of right hip     Plan: Impression is 77 year old female with right hip osteoarthritis exacerbation.  She has had partial relief from trochanteric injection.  I would like to get her set up with an intra-articular injection with Dr. Ernestina Patches.  Questions encouraged and answered.  Follow-up as needed.  Follow-Up Instructions: Return if symptoms worsen or fail to improve.   Orders:  Orders Placed This Encounter  Procedures  . Ambulatory referral to Physical Medicine Rehab   No orders of the defined types were placed in this encounter.     Procedures: No procedures performed   Clinical Data: No additional findings.   Subjective: Chief Complaint  Patient presents with  . Right Hip - Pain    Patient is a very pleasant 77 year old female comes in with right hip pain.  She had a previous trochanteric bursa injection which helped some.  This was performed about a month ago.  She still complains of groin pain that radiates down the thigh.  Denies any radicular symptoms.  Denies any numbness and tingling.   Review of Systems  Constitutional: Negative.   HENT: Negative.   Eyes: Negative.   Respiratory: Negative.   Cardiovascular: Negative.   Endocrine: Negative.   Musculoskeletal: Negative.   Neurological: Negative.   Hematological: Negative.   Psychiatric/Behavioral: Negative.   All other systems reviewed and are negative.    Objective: Vital Signs: There were no vitals taken for this visit.  Physical Exam  Constitutional: She is oriented to person, place, and time. She appears well-developed and well-nourished.  HENT:  Head: Normocephalic and  atraumatic.  Eyes: EOM are normal.  Neck: Neck supple.  Pulmonary/Chest: Effort normal.  Abdominal: Soft.  Neurological: She is alert and oriented to person, place, and time.  Skin: Skin is warm. Capillary refill takes less than 2 seconds.  Psychiatric: She has a normal mood and affect. Her behavior is normal. Judgment and thought content normal.  Nursing note and vitals reviewed.   Ortho Exam Right hip exam shows mildly positive FADIR mildly.  Negative Stinchfield sign.  Lateral hip is mildly tender. Specialty Comments:  No specialty comments available.  Imaging: No results found.   PMFS History: Patient Active Problem List   Diagnosis Date Noted  . 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 BMI 29.0-29.9,adult 09/02/2015  . Encounter for Medicare annual wellness exam 06/02/2015  . 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[place]) 11/03/2014  . CKD, 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) 11/02/2014  . Medication management 01/27/2014  . Hyperlipidemia 08/27/2013  . Essential hypertension   . Hypothyroidism   . GERD (gastroesophageal reflux disease)   . Vitamin D deficiency   . Hemorrhoids 07/15/2013   Past Medical History:  Diagnosis Date  . Anemia   . Arthritis   . Blood transfusion without reported diagnosis   . GERD (gastroesophageal reflux disease)   . HLD (hyperlipidemia)   . Hypertension   . Hypothyroidism   . Myelodysplasia   . Vitamin D deficiency     Family History  Problem Relation Age  of Onset  . Stroke Father   . Hypertension Father   . Other Mother        died at early age  . Stroke Sister   . Coronary artery disease Brother   . Hypertension Sister   . Bladder Cancer Sister   . Colon cancer Neg Hx   . Stomach cancer Neg Hx     Past Surgical History:  Procedure Laterality Date  . CARPAL TUNNEL RELEASE Right   . DILATION AND CURETTAGE OF UTERUS    . INGUINAL HERNIA REPAIR    . ROTATOR CUFF REPAIR Left   . TRANSANAL HEMORRHOIDAL DEARTERIALIZATION N/A 03/31/2017    Procedure: TRANSANAL HEMORRHOIDAL DEARTERIALIZATION;  Surgeon: Leighton Ruff, MD;  Location: [facility];  Service: General;  Laterality: N/A;  . UMBILICAL HERNIA REPAIR     Social History   Occupational History  . Occupation: retired    Fish farm manager: RETIRED  Tobacco Use  . Smoking status: Former Smoker    Types: Cigarettes    Last attempt to quit: 10/02/1996    Years since quitting: 21.6  . Smokeless tobacco: Never Used  Substance and Sexual Activity  . Alcohol use: Yes    Comment: glass of wine at least once a month.  . Drug use: No  . Sexual activity: Not Currently    Partners: Male

## 2018-05-25 ENCOUNTER — Other Ambulatory Visit: Payer: Self-pay | Admitting: Physician Assistant

## 2018-05-25 ENCOUNTER — Ambulatory Visit: Payer: Medicare Other

## 2018-05-25 ENCOUNTER — Other Ambulatory Visit: Payer: Medicare Other

## 2018-05-25 DIAGNOSIS — I10 Essential (primary) hypertension: Secondary | ICD-10-CM

## 2018-05-28 ENCOUNTER — Ambulatory Visit: Payer: Self-pay | Admitting: Internal Medicine

## 2018-05-29 ENCOUNTER — Ambulatory Visit (INDEPENDENT_AMBULATORY_CARE_PROVIDER_SITE_OTHER): Payer: Self-pay | Admitting: Physical Medicine and Rehabilitation

## 2018-06-01 ENCOUNTER — Inpatient Hospital Stay: Payer: Medicare Other

## 2018-06-01 DIAGNOSIS — N189 Chronic kidney disease, unspecified: Principal | ICD-10-CM

## 2018-06-01 DIAGNOSIS — D469 Myelodysplastic syndrome, unspecified: Secondary | ICD-10-CM

## 2018-06-01 DIAGNOSIS — D631 Anemia in chronic kidney disease: Secondary | ICD-10-CM | POA: Diagnosis not present

## 2018-06-01 DIAGNOSIS — Z7982 Long term (current) use of aspirin: Secondary | ICD-10-CM | POA: Diagnosis not present

## 2018-06-01 DIAGNOSIS — Z79899 Other long term (current) drug therapy: Secondary | ICD-10-CM | POA: Diagnosis not present

## 2018-06-01 LAB — FERRITIN: FERRITIN: 50 ng/mL (ref 11–307)

## 2018-06-01 LAB — CBC WITH DIFFERENTIAL/PLATELET
Basophils Absolute: 0 10*3/uL (ref 0.0–0.1)
Basophils Relative: 0 %
Eosinophils Absolute: 0.1 10*3/uL (ref 0.0–0.5)
Eosinophils Relative: 2 %
HEMATOCRIT: 33.6 % — AB (ref 34.8–46.6)
HEMOGLOBIN: 11.2 g/dL — AB (ref 11.6–15.9)
LYMPHS ABS: 1.9 10*3/uL (ref 0.9–3.3)
Lymphocytes Relative: 43 %
MCH: 28.4 pg (ref 25.1–34.0)
MCHC: 33.3 g/dL (ref 31.5–36.0)
MCV: 85.1 fL (ref 79.5–101.0)
MONOS PCT: 7 %
Monocytes Absolute: 0.3 10*3/uL (ref 0.1–0.9)
NEUTROS ABS: 2.1 10*3/uL (ref 1.5–6.5)
NEUTROS PCT: 48 %
Platelets: 115 10*3/uL — ABNORMAL LOW (ref 145–400)
RBC: 3.95 MIL/uL (ref 3.70–5.45)
RDW: 16.5 % — ABNORMAL HIGH (ref 11.2–14.5)
WBC: 4.3 10*3/uL (ref 3.9–10.3)

## 2018-06-01 LAB — IRON AND TIBC
Iron: 86 ug/dL (ref 41–142)
SATURATION RATIOS: 33 % (ref 21–57)
TIBC: 259 ug/dL (ref 236–444)
UIBC: 173 ug/dL

## 2018-06-01 NOTE — Progress Notes (Signed)
Hemoglobin noted  At 11.2, no injection needed per orders. Pt given current copy of labs and current schedule. Pt instructed to follow schedule and to call our office if issues occur.

## 2018-06-13 ENCOUNTER — Ambulatory Visit (INDEPENDENT_AMBULATORY_CARE_PROVIDER_SITE_OTHER): Payer: Self-pay

## 2018-06-13 ENCOUNTER — Ambulatory Visit (INDEPENDENT_AMBULATORY_CARE_PROVIDER_SITE_OTHER): Payer: Medicare Other | Admitting: Physical Medicine and Rehabilitation

## 2018-06-13 ENCOUNTER — Encounter (INDEPENDENT_AMBULATORY_CARE_PROVIDER_SITE_OTHER): Payer: Self-pay | Admitting: Physical Medicine and Rehabilitation

## 2018-06-13 DIAGNOSIS — M25551 Pain in right hip: Secondary | ICD-10-CM

## 2018-06-13 MED ORDER — TRIAMCINOLONE ACETONIDE 40 MG/ML IJ SUSP
80.0000 mg | INTRAMUSCULAR | Status: AC | PRN
  Administered 2018-06-13: 80 mg via INTRA_ARTICULAR

## 2018-06-13 MED ORDER — BUPIVACAINE HCL 0.25 % IJ SOLN
4.0000 mL | INTRAMUSCULAR | Status: AC | PRN
  Administered 2018-06-13: 4 mL via INTRA_ARTICULAR

## 2018-06-13 NOTE — Progress Notes (Signed)
 .  Numeric Pain Rating Scale and Functional Assessment Average Pain 8   In the last MONTH (on 0-10 scale) has pain interfered with the following?  1. General activity like being  able to carry out your everyday physical activities such as walking, climbing stairs, carrying groceries, or moving a chair?  Rating(5)    -Dye Allergies.

## 2018-06-13 NOTE — Patient Instructions (Signed)

## 2018-06-13 NOTE — Progress Notes (Signed)
Susan [name] - 77 y.o. female MRN [id number]  Date of birth: 15-Apr-1941  Office Visit Note: Visit Date: 06/13/2018 PCP: Unk Pinto, MD Referred by: Unk Pinto, MD  Subjective: Chief Complaint  Patient presents with  . Right Hip - Pain  . Right Leg - Pain   HPI: Susan [name] is a 77 year old female who recently saw Dr. Eduard Roux for evaluation of her right hip and thigh pain.  She got some mild relief with greater trochanteric injection but continues to have hip and anterior thigh pain.  This is worse with standing and ambulation.  She does use a cane.  She has had multiple orthopedic complaints over the years with knees and ankles etc.  We are going to complete a diagnostic and hopefully therapeutic right anesthetic hip arthrogram.   ROS Otherwise per HPI.  Assessment & Plan: Visit Diagnoses:  1. Pain in right hip     Plan: Findings:  Diagnostic note for therapeutic anesthetic hip arthrogram on the right.  Patient did get good relief during the anesthetic phase.    Meds & Orders: No orders of the defined types were placed in this encounter.   Orders Placed This Encounter  Procedures  . Large Joint Inj: R hip joint  . XR C-ARM NO REPORT    Follow-up: Return if symptoms worsen or fail to improve.   Procedures: Large Joint Inj: R hip joint on 06/13/2018 10:17 AM Indications: pain and diagnostic evaluation Details: 22 G needle, anterior approach  Arthrogram: Yes  Medications: 80 mg triamcinolone acetonide 40 MG/ML; 4 mL bupivacaine 0.25 % Outcome: tolerated well, no immediate complications  Arthrogram demonstrated excellent flow of contrast throughout the joint surface without extravasation or obvious defect.  The patient had relief of symptoms during the anesthetic phase of the injection.  Procedure, treatment alternatives, risks and benefits explained, specific risks discussed. Consent was given by the patient. Immediately prior to procedure a time out was  called to verify the correct patient, procedure, equipment, support staff and site/side marked as required. Patient was prepped and draped in the usual sterile fashion.      No notes on file   Clinical History: No specialty comments available.   She reports that she quit smoking about 21 years ago. Her smoking use included cigarettes. She has never used smokeless tobacco.  Recent Labs    11/07/17 1455 04/27/18 1102  HGBA1C 4.6  --   LABURIC  --  8.5*    Objective:  VS:  HT:    WT:   BMI:     BP:   HR: bpm  TEMP: ( )  RESP:  Physical Exam  Ortho Exam Imaging: Xr C-arm No Report  Result Date: 06/13/2018 Please see Notes tab for imaging impression.   Past Medical/Family/Surgical/Social History: Medications & Allergies reviewed per EMR, new medications updated. Patient Active Problem List   Diagnosis Date Noted  . 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 BMI 29.0-29.9,adult 09/02/2015  . Encounter for Medicare annual wellness exam 06/02/2015  . 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 CKD, 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) 11/02/2014  . Medication management 01/27/2014  . Hyperlipidemia 08/27/2013  . Essential hypertension   . Hypothyroidism   . GERD (gastroesophageal reflux disease)   . Vitamin D deficiency   . Hemorrhoids 07/15/2013   Past Medical History:  Diagnosis Date  . Anemia   . Arthritis   . Blood transfusion without reported diagnosis   . GERD (gastroesophageal reflux disease)   .  HLD (hyperlipidemia)   . Hypertension   . Hypothyroidism   . Myelodysplasia   . Vitamin D deficiency    Family History  Problem Relation Age of Onset  . Stroke Father   . Hypertension Father   . Other Mother        died at early age  . Stroke Sister   . Coronary artery disease Brother   . Hypertension Sister   . Bladder Cancer Sister   . Colon cancer Neg Hx   . Stomach cancer Neg Hx    Past Surgical History:  Procedure Laterality Date  . CARPAL TUNNEL RELEASE  Right   . DILATION AND CURETTAGE OF UTERUS    . INGUINAL HERNIA REPAIR    . ROTATOR CUFF REPAIR Left   . TRANSANAL HEMORRHOIDAL DEARTERIALIZATION N/A 03/31/2017   Procedure: TRANSANAL HEMORRHOIDAL DEARTERIALIZATION;  Surgeon: Leighton Ruff, MD;  Location: [facility];  Service: General;  Laterality: N/A;  . UMBILICAL HERNIA REPAIR     Social History   Occupational History  . Occupation: retired    Fish farm manager: RETIRED  Tobacco Use  . Smoking status: Former Smoker    Types: Cigarettes    Last attempt to quit: 10/02/1996    Years since quitting: 21.7  . Smokeless tobacco: Never Used  Substance and Sexual Activity  . Alcohol use: Yes    Comment: glass of wine at least once a month.  . Drug use: No  . Sexual activity: Not Currently    Partners: Male

## 2018-06-15 ENCOUNTER — Other Ambulatory Visit: Payer: Medicare Other

## 2018-06-15 ENCOUNTER — Ambulatory Visit: Payer: Medicare Other

## 2018-06-22 ENCOUNTER — Inpatient Hospital Stay: Payer: Medicare Other

## 2018-06-22 ENCOUNTER — Inpatient Hospital Stay: Payer: Medicare Other | Attending: Oncology

## 2018-06-22 VITALS — BP 174/87 | HR 60 | Temp 97.6°F | Resp 20

## 2018-06-22 DIAGNOSIS — D631 Anemia in chronic kidney disease: Secondary | ICD-10-CM | POA: Insufficient documentation

## 2018-06-22 DIAGNOSIS — N189 Chronic kidney disease, unspecified: Secondary | ICD-10-CM | POA: Diagnosis not present

## 2018-06-22 DIAGNOSIS — D469 Myelodysplastic syndrome, unspecified: Secondary | ICD-10-CM

## 2018-06-22 DIAGNOSIS — D500 Iron deficiency anemia secondary to blood loss (chronic): Secondary | ICD-10-CM

## 2018-06-22 LAB — CBC WITH DIFFERENTIAL/PLATELET
BASOS PCT: 0 %
Basophils Absolute: 0 10*3/uL (ref 0.0–0.1)
Eosinophils Absolute: 0.1 10*3/uL (ref 0.0–0.5)
Eosinophils Relative: 1 %
HCT: 31.8 % — ABNORMAL LOW (ref 34.8–46.6)
HEMOGLOBIN: 10.8 g/dL — AB (ref 11.6–15.9)
Lymphocytes Relative: 29 %
Lymphs Abs: 1.9 10*3/uL (ref 0.9–3.3)
MCH: 28.6 pg (ref 25.1–34.0)
MCHC: 34 g/dL (ref 31.5–36.0)
MCV: 84.2 fL (ref 79.5–101.0)
MONOS PCT: 12 %
Monocytes Absolute: 0.8 10*3/uL (ref 0.1–0.9)
NEUTROS ABS: 3.8 10*3/uL (ref 1.5–6.5)
NEUTROS PCT: 58 %
Platelets: 124 10*3/uL — ABNORMAL LOW (ref 145–400)
RBC: 3.78 MIL/uL (ref 3.70–5.45)
RDW: 16 % — ABNORMAL HIGH (ref 11.2–14.5)
WBC: 6.6 10*3/uL (ref 3.9–10.3)

## 2018-06-22 MED ORDER — DARBEPOETIN ALFA 300 MCG/0.6ML IJ SOSY
PREFILLED_SYRINGE | INTRAMUSCULAR | Status: AC
  Filled 2018-06-22: qty 0.6

## 2018-06-22 MED ORDER — DARBEPOETIN ALFA 300 MCG/0.6ML IJ SOSY
300.0000 ug | PREFILLED_SYRINGE | INTRAMUSCULAR | Status: DC
  Administered 2018-06-22: 300 ug via SUBCUTANEOUS

## 2018-06-22 NOTE — Progress Notes (Signed)
Pt.'s BP is 174/87 Per Dr. Alen Blew ok to give injection. Pt will take her meds when she gets home.

## 2018-06-22 NOTE — Patient Instructions (Signed)

## 2018-07-01 ENCOUNTER — Encounter: Payer: Self-pay | Admitting: Internal Medicine

## 2018-07-01 NOTE — Patient Instructions (Signed)

## 2018-07-01 NOTE — Progress Notes (Signed)
This very nice 77 y.o. WBF presents for 6 month follow up with HTN, HLD, Pre-Diabetes, Hypothyroidism, CKD3/Anemia and Vitamin D Deficiency. Patient has GERD controlled with her meds.     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        Patient is treated for HTN (1980's) & BP has been controlled at home. Today's BP is at goal - 110/68. Patient has had no complaints of any cardiac type chest pain, palpitations, dyspnea / orthopnea / PND, dizziness, claudication, or dependent edema.     Hyperlipidemia is not controlled with diet & she refuses to take meds for Cholesterol. Last Lipids were not at goal: Lab Results  Component Value Date   CHOL 191 02/22/2018   HDL 65 02/22/2018   LDLCALC 106 (H) 02/22/2018   TRIG 106 02/22/2018   CHOLHDL 2.9 02/22/2018      Also, the patient has history of Morbid Obesity (BMI 30+) and is monitored expectantly for  PreDiabetes. She has had no symptoms of reactive hypoglycemia, diabetic polys, paresthesias or visual blurring.  Last A1c was  Lab Results  Component Value Date   HGBA1C 4.6 11/07/2017      Patient has been on thyroid replacement since 2011.      Further, the patient also has history of Vitamin D Deficiency ("27"/2008)  and supplements vitamin D without any suspected side-effects. Last vitamin D was at goal: Lab Results  Component Value Date   VD25OH 60 02/22/2018   Current Outpatient Medications on File Prior to Visit  Medication Sig  . acetaminophen  500 MG tablet Take 500-1,000 mg every 6  hours as needed   . amLODipine  10 MG tablet TAKE 1/2 TABLET BY MOUTH DAILY AT BEDTIME  . aspirin 81 MG chewable tablet Chew 81 mg by mouth daily.   [name] VITAMIN D 5000 UNITS  Take 15,000 Units by mouth daily.   Kyra Searles INJECTION EVERY 3 WEEKS AS NEEDED  . diclofenac 75 MG EC tablet Take 75 mg by mouth daily.  . diphenhydrAMINE  25 mg capsule One capsule as needed   . HVICODIN)5-325  MG tablet Take 1 tablet  every 6  hours as needed   . ISOPTO TEARS  2.5 % ophth soln 1-2 drops  both eyes 3 x daily as needed for dry eyes.  [name] labetalol  300 MG tablet TAKE 1 TAB TWICE DAILY  . levothyroxine  100 MCG tablet TAKE 1 TAB DAILY   . Magnesium 500 MG TABS Take  every evening.   [name] OMEPRAZOLE P Take 1 tablet  as needed.     Allergies  Allergen Reactions  . Codeine Anaphylaxis  . Penicillins Anaphylaxis  . Bactrim Itching and Swelling  . Clarithromycin Other (See Comments)    "Caused skin to peel off my hand."  . Flagyl [Metronidazole Hcl] Itching and Swelling  . Food Other (See Comments)    EATS WHITE RICE ONLY (ALLERGIC REACTION TO YELLOW OR BROWN RICE THAT RESULTED IN HOSPITALIZATION)  . Pineapple Itching and Swelling    SWELLING OF EYES  . Sulfa Antibiotics Itching and Swelling   PMHx:   Past Medical History:  Diagnosis Date  . Anemia   . Arthritis   . Blood transfusion without reported diagnosis   . GERD (gastroesophageal reflux disease)   . HLD (hyperlipidemia)   . Hypertension   . Hypothyroidism   . Myelodysplasia   .  Vitamin D deficiency    Immunization History  Administered Date(s) Administered  . Td 10/03/2005   Past Surgical History:  Procedure Laterality Date  . CARPAL TUNNEL RELEASE Right   . DILATION AND CURETTAGE OF UTERUS    . INGUINAL HERNIA REPAIR    . ROTATOR CUFF REPAIR Left   . TRANSANAL HEMORRHOIDAL DEARTERIALIZATION N/A 03/31/2017   Procedure: TRANSANAL HEMORRHOIDAL DEARTERIALIZATION;  Surgeon: Leighton Ruff, MD;  Location: [facility];  Service: General;  Laterality: N/A;  . UMBILICAL HERNIA REPAIR     FHx:    Reviewed / unchanged  SHx:    Reviewed / unchanged   Systems Review:  Constitutional: Denies fever, chills, wt changes, headaches, insomnia, fatigue, night sweats, change in appetite. Eyes: Denies redness, blurred vision, diplopia, discharge, itchy, watery eyes.  ENT: Denies discharge, congestion, post nasal drip, epistaxis,  sore throat, earache, hearing loss, dental pain, tinnitus, vertigo, sinus pain, snoring.  CV: Denies chest pain, palpitations, irregular heartbeat, syncope, dyspnea, diaphoresis, orthopnea, PND, claudication or edema. Respiratory: denies cough, dyspnea, DOE, pleurisy, hoarseness, laryngitis, wheezing.  Gastrointestinal: Denies dysphagia, odynophagia, heartburn, reflux, water brash, abdominal pain or cramps, nausea, vomiting, bloating, diarrhea, constipation, hematemesis, melena, hematochezia  or hemorrhoids. Genitourinary: Denies dysuria, frequency, urgency, nocturia, hesitancy, discharge, hematuria or flank pain. Musculoskeletal: Denies arthralgias, myalgias, stiffness, jt. swelling, pain, limping or strain/sprain.  Skin: Denies pruritus, rash, hives, warts, acne, eczema or change in skin lesion(s). Neuro: No weakness, tremor, incoordination, spasms, paresthesia or pain. Psychiatric: Denies confusion, memory loss or sensory loss. Endo: Denies change in weight, skin or hair change.  Heme/Lymph: No excessive bleeding, bruising or enlarged lymph nodes.  Physical Exam  BP 110/68   Pulse 68   Temp (!) 97.5 F (36.4 C)   Resp 16   Ht 5' 2.5" (1.588 m)   Wt 158 lb 9.6 oz (71.9 kg)   BMI 28.55 kg/m   Appears  well nourished, well groomed  and in no distress.  Eyes: PERRLA, EOMs, conjunctiva no swelling or erythema. Sinuses: No frontal/maxillary tenderness ENT/Mouth: EAC's clear, TM's nl w/o erythema, bulging. Nares clear w/o erythema, swelling, exudates. Oropharynx clear without erythema or exudates. Oral hygiene is good. Tongue normal, non obstructing. Hearing intact.  Neck: Supple. Thyroid not palpable. Car 2+/2+ without bruits, nodes or JVD. Chest: Respirations nl with BS clear & equal w/o rales, rhonchi, wheezing or stridor.  Cor: Heart sounds normal w/ regular rate and rhythm without sig. murmurs, gallops, clicks or rubs. Peripheral pulses normal and equal  without edema.  Abdomen: Soft  & bowel sounds normal. Non-tender w/o guarding, rebound, hernias, masses or organomegaly.  Lymphatics: Unremarkable.  Musculoskeletal: Full ROM all peripheral extremities, joint stability, 5/5 strength and normal gait.  Skin: Warm, dry without exposed rashes, lesions or ecchymosis apparent.  Neuro: Cranial nerves intact, reflexes equal bilaterally. Sensory-motor testing grossly intact. Tendon reflexes grossly intact.  Pysch: Alert & oriented x 3.  Insight and judgement nl & appropriate. No ideations.  Assessment and Plan:  1. Essential hypertension  - Continue medication, monitor blood pressure at home.  - Continue DASH diet.  Reminder to go to the ER if any CP,  SOB, nausea, dizziness, severe HA, changes vision/speech.  - Magnesium - TSH - COMPLETE METABOLIC PANEL WITH GFR  2. Hyperlipidemia, mixed  - Continue diet/meds, exercise,& lifestyle modifications.   - TSH  3. Abnormal glucose  - Continue diet, exercise, lifestyle modifications.  - Monitor appropriate labs.  - Hemoglobin A1c - Insulin, random  4. Vitamin D  deficiency  - Continue supplementation.  - VITAMIN D 25 Hydroxyl  5. Prediabetes  - Hemoglobin A1c - Insulin, random  6. Hypothyroidism, unspecified type  - TSH  7. CKD, 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)  - COMPLETE METABOLIC PANEL WITH GFR  8. Anemia of renal disease  - COMPLETE METABOLIC PANEL WITH GFR  9. Gastroesophageal reflux disease, esophagitis presence not specified   10. Medication management  - Magnesium - TSH - Hemoglobin A1c - Insulin, random - VITAMIN D 25 Hydroxyl - COMPLETE METABOLIC PANEL WITH GFR       Discussed  regular exercise, BP monitoring, weight control to achieve/maintain BMI less than 25 and discussed med and SE's. Recommended labs to assess and monitor clinical status with further disposition pending results of labs. Over 30 minutes of exam, counseling, chart review was performed.

## 2018-07-02 ENCOUNTER — Ambulatory Visit (INDEPENDENT_AMBULATORY_CARE_PROVIDER_SITE_OTHER): Payer: Medicare Other | Admitting: Internal Medicine

## 2018-07-02 VITALS — BP 110/68 | HR 68 | Temp 97.5°F | Resp 16 | Ht 62.5 in | Wt 158.6 lb

## 2018-07-02 DIAGNOSIS — R7309 Other abnormal glucose: Secondary | ICD-10-CM | POA: Diagnosis not present

## 2018-07-02 DIAGNOSIS — Z79899 Other long term (current) drug therapy: Secondary | ICD-10-CM | POA: Diagnosis not present

## 2018-07-02 DIAGNOSIS — I10 Essential (primary) hypertension: Secondary | ICD-10-CM | POA: Diagnosis not present

## 2018-07-02 DIAGNOSIS — R7303 Prediabetes: Secondary | ICD-10-CM | POA: Diagnosis not present

## 2018-07-02 DIAGNOSIS — D631 Anemia in chronic kidney disease: Secondary | ICD-10-CM

## 2018-07-02 DIAGNOSIS — N183 Chronic kidney disease, stage 3 unspecified: Secondary | ICD-10-CM

## 2018-07-02 DIAGNOSIS — E559 Vitamin D deficiency, unspecified: Secondary | ICD-10-CM | POA: Diagnosis not present

## 2018-07-02 DIAGNOSIS — N189 Chronic kidney disease, unspecified: Secondary | ICD-10-CM

## 2018-07-02 DIAGNOSIS — E782 Mixed hyperlipidemia: Secondary | ICD-10-CM | POA: Diagnosis not present

## 2018-07-02 DIAGNOSIS — E039 Hypothyroidism, unspecified: Secondary | ICD-10-CM

## 2018-07-02 DIAGNOSIS — K219 Gastro-esophageal reflux disease without esophagitis: Secondary | ICD-10-CM

## 2018-07-03 LAB — COMPLETE METABOLIC PANEL WITH GFR
AG Ratio: 1.5 (calc) (ref 1.0–2.5)
ALT: 9 U/L (ref 6–29)
AST: 15 U/L (ref 10–35)
Albumin: 4.2 g/dL (ref 3.6–5.1)
Alkaline phosphatase (APISO): 105 U/L (ref 33–130)
BUN/Creatinine Ratio: 14 (calc) (ref 6–22)
BUN: 18 mg/dL (ref 7–25)
CALCIUM: 9.4 mg/dL (ref 8.6–10.4)
CO2: 26 mmol/L (ref 20–32)
CREATININE: 1.31 mg/dL — AB (ref 0.60–0.93)
Chloride: 108 mmol/L (ref 98–110)
GFR, EST NON AFRICAN AMERICAN: 39 mL/min/{1.73_m2} — AB (ref >=60)
GFR, Est African American: 45 mL/min/{1.73_m2} — ABNORMAL LOW (ref >=60)
GLUCOSE: 52 mg/dL — AB (ref 65–99)
Globulin: 2.8 g/dL (calc) (ref 1.9–3.7)
Potassium: 3.7 mmol/L (ref 3.5–5.3)
Sodium: 144 mmol/L (ref 135–146)
Total Bilirubin: 0.8 mg/dL (ref 0.2–1.2)
Total Protein: 7 g/dL (ref 6.1–8.1)

## 2018-07-03 LAB — HEMOGLOBIN A1C
Hgb A1c MFr Bld: 4.6 % of total Hgb (ref <5.7)
Mean Plasma Glucose: 85 (calc)
eAG (mmol/L): 4.7 (calc)

## 2018-07-03 LAB — TSH: TSH: 5.03 m[IU]/L — AB (ref 0.40–4.50)

## 2018-07-03 LAB — MAGNESIUM: MAGNESIUM: 1.4 mg/dL — AB (ref 1.5–2.5)

## 2018-07-03 LAB — INSULIN, RANDOM: INSULIN: 8.5 u[IU]/mL (ref 2.0–19.6)

## 2018-07-03 LAB — VITAMIN D 25 HYDROXY (VIT D DEFICIENCY, FRACTURES): Vit D, 25-Hydroxy: 52 ng/mL (ref 30–100)

## 2018-07-06 ENCOUNTER — Other Ambulatory Visit: Payer: Medicare Other

## 2018-07-06 ENCOUNTER — Ambulatory Visit: Payer: Medicare Other

## 2018-07-13 ENCOUNTER — Inpatient Hospital Stay: Payer: Medicare Other | Attending: Oncology

## 2018-07-13 ENCOUNTER — Inpatient Hospital Stay: Payer: Medicare Other

## 2018-07-13 DIAGNOSIS — N189 Chronic kidney disease, unspecified: Secondary | ICD-10-CM | POA: Insufficient documentation

## 2018-07-13 DIAGNOSIS — D469 Myelodysplastic syndrome, unspecified: Secondary | ICD-10-CM

## 2018-07-13 DIAGNOSIS — D631 Anemia in chronic kidney disease: Secondary | ICD-10-CM | POA: Diagnosis not present

## 2018-07-13 LAB — CBC WITH DIFFERENTIAL/PLATELET
Abs Immature Granulocytes: 0.01 10*3/uL (ref 0.00–0.07)
BASOS ABS: 0 10*3/uL (ref 0.0–0.1)
BASOS PCT: 0 %
EOS ABS: 0.1 10*3/uL (ref 0.0–0.5)
Eosinophils Relative: 2 %
HCT: 32.3 % — ABNORMAL LOW (ref 36.0–46.0)
Hemoglobin: 11.2 g/dL — ABNORMAL LOW (ref 12.0–15.0)
IMMATURE GRANULOCYTES: 0 %
LYMPHS PCT: 43 %
Lymphs Abs: 1.8 10*3/uL (ref 0.7–4.0)
MCH: 28.9 pg (ref 26.0–34.0)
MCHC: 34.7 g/dL (ref 30.0–36.0)
MCV: 83.5 fL (ref 80.0–100.0)
Monocytes Absolute: 0.3 10*3/uL (ref 0.1–1.0)
Monocytes Relative: 7 %
Neutro Abs: 2 10*3/uL (ref 1.7–7.7)
Neutrophils Relative %: 48 %
PLATELETS: 109 10*3/uL — AB (ref 150–400)
RBC: 3.87 MIL/uL (ref 3.87–5.11)
RDW: 15.2 % (ref 11.5–15.5)
WBC: 4.2 10*3/uL (ref 4.0–10.5)
nRBC: 0 % (ref 0.0–0.2)

## 2018-07-13 MED ORDER — DARBEPOETIN ALFA 300 MCG/0.6ML IJ SOSY
PREFILLED_SYRINGE | INTRAMUSCULAR | Status: AC
  Filled 2018-07-13: qty 0.6

## 2018-07-13 NOTE — Progress Notes (Signed)
Patient did not meet parameters for Aranesp injection. Hgb 11.2. Lab results were given to pt and will return for 3 week injection appt. Cyniah Gossard LPN

## 2018-07-19 ENCOUNTER — Telehealth: Payer: Self-pay | Admitting: *Deleted

## 2018-07-19 NOTE — Telephone Encounter (Signed)
Pt aware of lab results 

## 2018-07-20 ENCOUNTER — Other Ambulatory Visit: Payer: Self-pay | Admitting: Internal Medicine

## 2018-07-20 DIAGNOSIS — E039 Hypothyroidism, unspecified: Secondary | ICD-10-CM

## 2018-07-27 ENCOUNTER — Ambulatory Visit: Payer: Medicare Other

## 2018-07-27 ENCOUNTER — Other Ambulatory Visit: Payer: Medicare Other

## 2018-08-03 ENCOUNTER — Inpatient Hospital Stay: Payer: Medicare Other

## 2018-08-03 ENCOUNTER — Inpatient Hospital Stay: Payer: Medicare Other | Attending: Oncology

## 2018-08-03 VITALS — BP 143/75 | HR 64 | Temp 98.4°F | Resp 20

## 2018-08-03 DIAGNOSIS — N189 Chronic kidney disease, unspecified: Secondary | ICD-10-CM | POA: Diagnosis not present

## 2018-08-03 DIAGNOSIS — D469 Myelodysplastic syndrome, unspecified: Secondary | ICD-10-CM

## 2018-08-03 DIAGNOSIS — D631 Anemia in chronic kidney disease: Secondary | ICD-10-CM | POA: Insufficient documentation

## 2018-08-03 LAB — CBC WITH DIFFERENTIAL/PLATELET
Abs Immature Granulocytes: 0.02 10*3/uL (ref 0.00–0.07)
BASOS PCT: 0 %
Basophils Absolute: 0 10*3/uL (ref 0.0–0.1)
Eosinophils Absolute: 0.1 10*3/uL (ref 0.0–0.5)
Eosinophils Relative: 1 %
HCT: 28.7 % — ABNORMAL LOW (ref 36.0–46.0)
Hemoglobin: 9.5 g/dL — ABNORMAL LOW (ref 12.0–15.0)
IMMATURE GRANULOCYTES: 0 %
LYMPHS PCT: 35 %
Lymphs Abs: 1.6 10*3/uL (ref 0.7–4.0)
MCH: 28.4 pg (ref 26.0–34.0)
MCHC: 33.1 g/dL (ref 30.0–36.0)
MCV: 85.9 fL (ref 80.0–100.0)
Monocytes Absolute: 0.4 10*3/uL (ref 0.1–1.0)
Monocytes Relative: 8 %
NEUTROS ABS: 2.6 10*3/uL (ref 1.7–7.7)
NEUTROS PCT: 56 %
Platelets: 133 10*3/uL — ABNORMAL LOW (ref 150–400)
RBC: 3.34 MIL/uL — AB (ref 3.87–5.11)
RDW: 14.5 % (ref 11.5–15.5)
WBC: 4.7 10*3/uL (ref 4.0–10.5)
nRBC: 0 % (ref 0.0–0.2)

## 2018-08-03 MED ORDER — DARBEPOETIN ALFA 300 MCG/0.6ML IJ SOSY
PREFILLED_SYRINGE | INTRAMUSCULAR | Status: AC
  Filled 2018-08-03: qty 0.6

## 2018-08-03 MED ORDER — DARBEPOETIN ALFA 300 MCG/0.6ML IJ SOSY
300.0000 ug | PREFILLED_SYRINGE | INTRAMUSCULAR | Status: DC
  Administered 2018-08-03: 300 ug via SUBCUTANEOUS

## 2018-08-03 NOTE — Patient Instructions (Signed)

## 2018-08-17 ENCOUNTER — Ambulatory Visit: Payer: Medicare Other

## 2018-08-17 ENCOUNTER — Other Ambulatory Visit: Payer: Medicare Other

## 2018-08-21 DIAGNOSIS — N183 Chronic kidney disease, stage 3 (moderate): Secondary | ICD-10-CM | POA: Diagnosis not present

## 2018-08-21 DIAGNOSIS — E039 Hypothyroidism, unspecified: Secondary | ICD-10-CM | POA: Diagnosis not present

## 2018-08-21 DIAGNOSIS — D519 Vitamin B12 deficiency anemia, unspecified: Secondary | ICD-10-CM | POA: Diagnosis not present

## 2018-08-21 DIAGNOSIS — K219 Gastro-esophageal reflux disease without esophagitis: Secondary | ICD-10-CM | POA: Diagnosis not present

## 2018-08-21 DIAGNOSIS — I10 Essential (primary) hypertension: Secondary | ICD-10-CM | POA: Diagnosis not present

## 2018-08-22 ENCOUNTER — Other Ambulatory Visit: Payer: Self-pay | Admitting: Adult Health

## 2018-08-22 ENCOUNTER — Other Ambulatory Visit: Payer: Self-pay | Admitting: Internal Medicine

## 2018-08-22 DIAGNOSIS — E039 Hypothyroidism, unspecified: Secondary | ICD-10-CM

## 2018-08-23 ENCOUNTER — Telehealth: Payer: Self-pay | Admitting: Sports Medicine

## 2018-08-23 NOTE — Telephone Encounter (Signed)
Please fax notes to referring Physician at [phone]

## 2018-08-24 ENCOUNTER — Inpatient Hospital Stay: Payer: Medicare Other

## 2018-08-24 VITALS — BP 158/71 | HR 59 | Resp 18

## 2018-08-24 DIAGNOSIS — D469 Myelodysplastic syndrome, unspecified: Secondary | ICD-10-CM

## 2018-08-24 DIAGNOSIS — N189 Chronic kidney disease, unspecified: Secondary | ICD-10-CM | POA: Diagnosis not present

## 2018-08-24 DIAGNOSIS — D631 Anemia in chronic kidney disease: Secondary | ICD-10-CM | POA: Diagnosis not present

## 2018-08-24 DIAGNOSIS — D500 Iron deficiency anemia secondary to blood loss (chronic): Secondary | ICD-10-CM

## 2018-08-24 LAB — CBC WITH DIFFERENTIAL/PLATELET
ABS IMMATURE GRANULOCYTES: 0.01 10*3/uL (ref 0.00–0.07)
BASOS PCT: 0 %
Basophils Absolute: 0 10*3/uL (ref 0.0–0.1)
Eosinophils Absolute: 0.1 10*3/uL (ref 0.0–0.5)
Eosinophils Relative: 2 %
HCT: 32 % — ABNORMAL LOW (ref 36.0–46.0)
Hemoglobin: 10.5 g/dL — ABNORMAL LOW (ref 12.0–15.0)
IMMATURE GRANULOCYTES: 0 %
Lymphocytes Relative: 52 %
Lymphs Abs: 2.1 10*3/uL (ref 0.7–4.0)
MCH: 28.5 pg (ref 26.0–34.0)
MCHC: 32.8 g/dL (ref 30.0–36.0)
MCV: 87 fL (ref 80.0–100.0)
MONOS PCT: 10 %
Monocytes Absolute: 0.4 10*3/uL (ref 0.1–1.0)
NEUTROS ABS: 1.4 10*3/uL — AB (ref 1.7–7.7)
Neutrophils Relative %: 36 %
PLATELETS: 106 10*3/uL — AB (ref 150–400)
RBC: 3.68 MIL/uL — AB (ref 3.87–5.11)
RDW: 15 % (ref 11.5–15.5)
WBC: 4.1 10*3/uL (ref 4.0–10.5)
nRBC: 0 % (ref 0.0–0.2)

## 2018-08-24 MED ORDER — DARBEPOETIN ALFA 300 MCG/0.6ML IJ SOSY
300.0000 ug | PREFILLED_SYRINGE | INTRAMUSCULAR | Status: DC
  Administered 2018-08-24: 300 ug via SUBCUTANEOUS

## 2018-08-24 MED ORDER — DARBEPOETIN ALFA 300 MCG/0.6ML IJ SOSY
PREFILLED_SYRINGE | INTRAMUSCULAR | Status: AC
  Filled 2018-08-24: qty 0.6

## 2018-08-27 ENCOUNTER — Other Ambulatory Visit: Payer: Self-pay

## 2018-08-28 ENCOUNTER — Ambulatory Visit: Payer: Medicare Other | Admitting: Sports Medicine

## 2018-09-07 ENCOUNTER — Other Ambulatory Visit: Payer: Medicare Other

## 2018-09-07 ENCOUNTER — Ambulatory Visit: Payer: Medicare Other

## 2018-09-11 ENCOUNTER — Ambulatory Visit: Payer: Medicare Other | Admitting: Sports Medicine

## 2018-09-13 ENCOUNTER — Other Ambulatory Visit: Payer: Self-pay

## 2018-09-13 DIAGNOSIS — D631 Anemia in chronic kidney disease: Secondary | ICD-10-CM

## 2018-09-13 DIAGNOSIS — N189 Chronic kidney disease, unspecified: Principal | ICD-10-CM

## 2018-09-14 ENCOUNTER — Inpatient Hospital Stay: Payer: Medicare Other | Attending: Oncology

## 2018-09-14 ENCOUNTER — Inpatient Hospital Stay: Payer: Medicare Other

## 2018-09-14 VITALS — BP 179/89 | HR 59 | Temp 98.2°F | Resp 18

## 2018-09-14 DIAGNOSIS — D649 Anemia, unspecified: Secondary | ICD-10-CM

## 2018-09-14 DIAGNOSIS — D631 Anemia in chronic kidney disease: Secondary | ICD-10-CM | POA: Insufficient documentation

## 2018-09-14 DIAGNOSIS — N189 Chronic kidney disease, unspecified: Secondary | ICD-10-CM | POA: Diagnosis not present

## 2018-09-14 LAB — CBC WITH DIFFERENTIAL (CANCER CENTER ONLY)
ABS IMMATURE GRANULOCYTES: 0.01 10*3/uL (ref 0.00–0.07)
Basophils Absolute: 0 10*3/uL (ref 0.0–0.1)
Basophils Relative: 1 %
Eosinophils Absolute: 0.1 10*3/uL (ref 0.0–0.5)
Eosinophils Relative: 3 %
HEMATOCRIT: 32.4 % — AB (ref 36.0–46.0)
HEMOGLOBIN: 11.1 g/dL — AB (ref 12.0–15.0)
Immature Granulocytes: 0 %
LYMPHS PCT: 52 %
Lymphs Abs: 1.9 10*3/uL (ref 0.7–4.0)
MCH: 28.3 pg (ref 26.0–34.0)
MCHC: 34.3 g/dL (ref 30.0–36.0)
MCV: 82.7 fL (ref 80.0–100.0)
MONO ABS: 0.3 10*3/uL (ref 0.1–1.0)
MONOS PCT: 8 %
NEUTROS ABS: 1.3 10*3/uL — AB (ref 1.7–7.7)
Neutrophils Relative %: 36 %
Platelet Count: 129 10*3/uL — ABNORMAL LOW (ref 150–400)
RBC: 3.92 MIL/uL (ref 3.87–5.11)
RDW: 14.5 % (ref 11.5–15.5)
WBC Count: 3.7 10*3/uL — ABNORMAL LOW (ref 4.0–10.5)
nRBC: 0 % (ref 0.0–0.2)

## 2018-09-14 MED ORDER — DARBEPOETIN ALFA 300 MCG/0.6ML IJ SOSY: 300.0000 ug | PREFILLED_SYRINGE | INTRAMUSCULAR | Status: DC

## 2018-09-14 MED ORDER — DARBEPOETIN ALFA 300 MCG/0.6ML IJ SOSY
PREFILLED_SYRINGE | INTRAMUSCULAR | Status: AC
  Filled 2018-09-14: qty 0.6

## 2018-09-14 NOTE — Progress Notes (Signed)
Per Dr. Alen Blew, no Aranesp 300 today.  HGB 11.1

## 2018-09-14 NOTE — Patient Instructions (Signed)

## 2018-09-28 ENCOUNTER — Ambulatory Visit: Payer: Medicare Other

## 2018-09-28 ENCOUNTER — Other Ambulatory Visit: Payer: Medicare Other

## 2018-10-04 ENCOUNTER — Other Ambulatory Visit: Payer: Self-pay | Admitting: *Deleted

## 2018-10-04 DIAGNOSIS — D469 Myelodysplastic syndrome, unspecified: Secondary | ICD-10-CM

## 2018-10-05 ENCOUNTER — Inpatient Hospital Stay: Payer: Medicare Other

## 2018-10-05 ENCOUNTER — Inpatient Hospital Stay: Payer: Medicare Other | Attending: Oncology

## 2018-10-05 VITALS — BP 175/93 | HR 66 | Resp 18

## 2018-10-05 DIAGNOSIS — N183 Chronic kidney disease, stage 3 unspecified: Secondary | ICD-10-CM

## 2018-10-05 DIAGNOSIS — Z79899 Other long term (current) drug therapy: Secondary | ICD-10-CM | POA: Diagnosis not present

## 2018-10-05 DIAGNOSIS — D631 Anemia in chronic kidney disease: Secondary | ICD-10-CM | POA: Diagnosis not present

## 2018-10-05 DIAGNOSIS — Z7982 Long term (current) use of aspirin: Secondary | ICD-10-CM | POA: Insufficient documentation

## 2018-10-05 DIAGNOSIS — I10 Essential (primary) hypertension: Secondary | ICD-10-CM | POA: Diagnosis not present

## 2018-10-05 DIAGNOSIS — N189 Chronic kidney disease, unspecified: Principal | ICD-10-CM

## 2018-10-05 DIAGNOSIS — D469 Myelodysplastic syndrome, unspecified: Secondary | ICD-10-CM

## 2018-10-05 LAB — CBC WITH DIFFERENTIAL (CANCER CENTER ONLY)
Abs Immature Granulocytes: 0.01 10*3/uL (ref 0.00–0.07)
BASOS ABS: 0 10*3/uL (ref 0.0–0.1)
Basophils Relative: 0 %
Eosinophils Absolute: 0.1 10*3/uL (ref 0.0–0.5)
Eosinophils Relative: 3 %
HCT: 32.6 % — ABNORMAL LOW (ref 36.0–46.0)
Hemoglobin: 10.7 g/dL — ABNORMAL LOW (ref 12.0–15.0)
Immature Granulocytes: 0 %
Lymphocytes Relative: 43 %
Lymphs Abs: 1.8 10*3/uL (ref 0.7–4.0)
MCH: 27.4 pg (ref 26.0–34.0)
MCHC: 32.8 g/dL (ref 30.0–36.0)
MCV: 83.4 fL (ref 80.0–100.0)
Monocytes Absolute: 0.3 10*3/uL (ref 0.1–1.0)
Monocytes Relative: 8 %
NRBC: 0 % (ref 0.0–0.2)
Neutro Abs: 2 10*3/uL (ref 1.7–7.7)
Neutrophils Relative %: 46 %
Platelet Count: 132 10*3/uL — ABNORMAL LOW (ref 150–400)
RBC: 3.91 MIL/uL (ref 3.87–5.11)
RDW: 14.6 % (ref 11.5–15.5)
WBC Count: 4.3 10*3/uL (ref 4.0–10.5)

## 2018-10-05 MED ORDER — DARBEPOETIN ALFA 300 MCG/0.6ML IJ SOSY
300.0000 ug | PREFILLED_SYRINGE | INTRAMUSCULAR | Status: DC
  Administered 2018-10-05: 300 ug via SUBCUTANEOUS

## 2018-10-05 MED ORDER — DARBEPOETIN ALFA 300 MCG/0.6ML IJ SOSY
PREFILLED_SYRINGE | INTRAMUSCULAR | Status: AC
  Filled 2018-10-05: qty 0.6

## 2018-10-05 NOTE — Progress Notes (Signed)
Pt's BP 175/93 OK to give aranesp injection per Dr. Alen Blew. Pt. Stated she did not take her medication today, will take the medication when she gets home.

## 2018-10-05 NOTE — Patient Instructions (Signed)

## 2018-10-06 ENCOUNTER — Other Ambulatory Visit: Payer: Self-pay | Admitting: Physician Assistant

## 2018-10-06 DIAGNOSIS — I10 Essential (primary) hypertension: Secondary | ICD-10-CM

## 2018-10-09 ENCOUNTER — Ambulatory Visit: Payer: Medicare Other | Admitting: Sports Medicine

## 2018-10-16 ENCOUNTER — Ambulatory Visit: Payer: Medicare Other | Admitting: Sports Medicine

## 2018-10-19 ENCOUNTER — Ambulatory Visit: Payer: Medicare Other | Admitting: Oncology

## 2018-10-19 ENCOUNTER — Ambulatory Visit: Payer: Medicare Other

## 2018-10-19 ENCOUNTER — Other Ambulatory Visit: Payer: Medicare Other

## 2018-10-22 NOTE — Progress Notes (Deleted)
FOLLOW UP  Assessment and Plan:   Hypertension Well controlled with current medications  Monitor blood pressure at home; patient to call if consistently greater than 130/80 Continue DASH diet.   Reminder to go to the ER if any CP, SOB, nausea, dizziness, severe HA, changes vision/speech, left arm numbness and tingling and jaw pain.  Cholesterol Currently at goal;  Continue low cholesterol diet and exercise.  Check lipid panel.   Diabetes {with or without complications:30421263} Continue medication: Continue diet and exercise.  Perform daily foot/skin check, notify office of any concerning changes.  Check A1C  Obesity with co morbidities Long discussion about weight loss, diet, and exercise Recommended diet heavy in fruits and veggies and low in animal meats, cheeses, and dairy products, appropriate calorie intake Discussed ideal weight for height (below ***) and initial weight goal (***) Patient will work on *** Will follow up in 3 months  Vitamin D Def At goal at last visit; continue supplementation to maintain goal of 70-100 Defer Vit D level  Continue diet and meds as discussed. Further disposition pending results of labs. Discussed med's effects and SE's.   Over 30 minutes of exam, counseling, chart review, and critical decision making was performed.   Future Appointments  Date Time Provider [place]  10/23/2018  9:30 AM Garnet Sierras, NP [facility] None  10/26/2018  9:00 AM [facility] 2 [facility] None  10/26/2018  9:30 AM Wyatt Portela, MD [facility] None  10/26/2018 10:00 AM [facility] [place] [facility]USH [facility] None  11/16/2018  8:00 AM [facility] LAB 3 [facility] None  11/16/2018  8:45 AM [facility]USH [facility] None  12/07/2018  8:00 AM [facility] LAB 2 [facility] None  12/07/2018  8:45 AM [facility] [place] FLUSH [facility] None  12/28/2018  8:00 AM [facility] 3 [facility] None  12/28/2018  8:45 AM [facility] [place] FLUSH [facility] None  01/30/2019  10:00 AM Unk Pinto, MD [facility] None  03/04/2019  9:00 AM [name], NP [facility] None    ----------------------------------------------------------------------------------------------------------------------  HPI 78 y.o. female  WBF presents for 3 month follow up on HTN, HLD, pre-diabetes, hypothyroidism, CKD3, Anemia related to renal disease, weight and vitamin D deficiency.                                         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 Wt Readings from Last 3 Encounters:  07/02/18 158 lb 9.6 oz (71.9 kg)  05/03/18 159 lb 14.4 oz (72.5 kg)  04/22/18 165 lb (74.8 kg)    Patient is treated for HTN (1980's) & BP has been controlled at home. Today's BP is at goal - 110/68. Patient has had no complaints of any cardiac type chest pain, palpitations, dyspnea / orthopnea / PND, dizziness, claudication, or dependent edema. Her blood pressure {HAS HAS NOT:18834} been controlled at home, today their BP is    She {DOES_DOES WEX:93716} workout. She denies chest pain, shortness of breath, dizziness.   She is not  on cholesterol medication. Her cholesterol is not at goal. The cholesterol last visit was:   Lab Results  Component Value Date   CHOL 191 02/22/2018   HDL 65 02/22/2018   LDLCALC 106 (H) 02/22/2018   TRIG 106 02/22/2018   CHOLHDL 2.9 02/22/2018    She {Has/has not:18111} been working on diet and exercise for prediabetes, and denies {Symptoms; diabetes w/o none:19199}. Last A1C in the office was:  Lab Results  Component Value Date   HGBA1C 4.6 07/02/2018   Patient is on Vitamin D supplement.   Lab Results  Component Value Date   VD25OH 52 07/02/2018         Current Medications:  Current Outpatient Medications on File Prior to Visit  Medication Sig  . acetaminophen (TYLENOL) 500 MG tablet Take 500-1,000 mg by mouth every 6 (six) hours as needed (for pain.).  [name] amLODipine (NORVASC) 10 MG tablet TAKE 1/2 TABLET BY MOUTH DAILY AT BEDTIME; (TAKE 1 TABLET IF SBP IS >150) (Patient taking differently: Take 10 mg by mouth at bedtime. (TAKE 1 TABLET IF SBP IS >150))  . aspirin 81 MG chewable tablet Chew 81 mg by mouth daily.   . Cholecalciferol (VITAMIN D-3) 5000 UNITS TABS Take 15,000 Units by mouth daily.   . Darbepoetin Alfa (ARANESP, ALBUMIN FREE,) 300 MCG/ML SOLN Inject 300 mcg into the skin See admin instructions. USE INJECTION EVERY 3 WEEKS AS NEEDED FOR HEMOGLOBIN LESS THAN 11.  . diclofenac (VOLTAREN) 75 MG EC tablet TAKE 1 TABLET(75 MG) BY MOUTH TWICE DAILY  . diphenhydrAMINE (BENADRYL) 25 mg capsule One capsule every 6 hours x 3 days, then as needed for symptoms of rash/itching  . HYDROcodone-acetaminophen (NORCO/VICODIN) 5-325 MG tablet Take 1 tablet by mouth every 6 (six) hours as needed for moderate pain.  . hydroxypropyl methylcellulose / hypromellose (ISOPTO TEARS / GONIOVISC) 2.5 % ophthalmic solution Place 1-2 drops into both eyes 3 (three) times daily as needed for dry eyes.  [name] labetalol (NORMODYNE) 300 MG tablet TAKE 1 TABLET BY MOUTH TWICE DAILY  . levothyroxine (SYNTHROID, LEVOTHROID) 100 MCG tablet TAKE 1 TABLET BY MOUTH EVERY DAY ON AN EMPTY STOMACH FOR 30 MINUTES  . Magnesium 500 MG TABS Take 500 mg by mouth every evening.   [name] OMEPRAZOLE PO Take 1 tablet by mouth as needed.   . pantoprazole (PROTONIX) 40 MG tablet TK 1 T PO QD FOR ACID REFLUX   No current facility-administered medications on file prior to visit.      Allergies:  Allergies  Allergen Reactions  . Codeine Anaphylaxis  . Penicillins Anaphylaxis  . Bactrim Itching and Swelling  . Clarithromycin Other (See Comments)    "Caused skin to peel off my hand."   . Flagyl [Metronidazole Hcl] Itching and Swelling  . Food Other (See Comments)    EATS WHITE RICE ONLY (ALLERGIC REACTION TO YELLOW OR BROWN RICE THAT RESULTED IN HOSPITALIZATION)  . Pineapple Itching and Swelling    SWELLING OF EYES  . Sulfa Antibiotics Itching and Swelling     Medical History:  Past Medical History:  Diagnosis Date  . Anemia   . Arthritis   . Blood transfusion without reported diagnosis   . GERD (gastroesophageal reflux disease)   . HLD (hyperlipidemia)   . Hypertension   . Hypothyroidism   . Myelodysplasia   . Vitamin D deficiency    Family history- Reviewed and unchanged Social history- Reviewed and unchanged   Review of Systems:  ROS    Physical Exam:  There were no vitals taken for this visit. Wt Readings from Last 3 Encounters:  07/02/18 158 lb 9.6 oz (71.9 kg)  05/03/18 159 lb 14.4 oz (72.5 kg)  04/22/18 165 lb (74.8 kg)   General Appearance: Well nourished, in no apparent distress. Eyes: PERRLA, EOMs, conjunctiva no swelling or erythema Sinuses: No Frontal/maxillary tenderness ENT/Mouth: Ext aud canals clear, TMs without erythema, bulging. No erythema, swelling, or exudate on post pharynx.  Tonsils not swollen or erythematous. Hearing normal.  Neck: Supple, thyroid normal.  Respiratory: Respiratory effort normal, BS equal bilaterally without rales, rhonchi, wheezing or stridor.  Cardio: RRR with no MRGs. Brisk peripheral pulses without edema.  Abdomen: Soft, + BS.  Non tender, no guarding, rebound, hernias, masses. Lymphatics: Non tender without lymphadenopathy.  Musculoskeletal: Full ROM, 5/5 strength, {PSY - GAIT AND STATION:22860} gait Skin: Warm, dry without rashes, lesions, ecchymosis.  Neuro: Cranial nerves intact. No cerebellar symptoms.  Psych: Awake and oriented X 3, normal affect, Insight and Judgment appropriate.    Garnet Sierras, NP 10:18 PM [facility]

## 2018-10-23 ENCOUNTER — Ambulatory Visit: Payer: Self-pay | Admitting: Physician Assistant

## 2018-10-23 ENCOUNTER — Ambulatory Visit: Payer: Self-pay | Admitting: Adult Health Nurse Practitioner

## 2018-10-25 ENCOUNTER — Other Ambulatory Visit: Payer: Self-pay

## 2018-10-25 DIAGNOSIS — D469 Myelodysplastic syndrome, unspecified: Secondary | ICD-10-CM

## 2018-10-26 ENCOUNTER — Telehealth: Payer: Self-pay | Admitting: Oncology

## 2018-10-26 ENCOUNTER — Inpatient Hospital Stay: Payer: Medicare Other

## 2018-10-26 ENCOUNTER — Inpatient Hospital Stay (HOSPITAL_BASED_OUTPATIENT_CLINIC_OR_DEPARTMENT_OTHER): Payer: Medicare Other | Admitting: Oncology

## 2018-10-26 VITALS — BP 142/95 | HR 61 | Temp 98.0°F | Resp 18

## 2018-10-26 VITALS — BP 165/96 | HR 67 | Temp 98.2°F | Resp 17 | Ht 62.5 in | Wt 160.6 lb

## 2018-10-26 DIAGNOSIS — D631 Anemia in chronic kidney disease: Secondary | ICD-10-CM

## 2018-10-26 DIAGNOSIS — D469 Myelodysplastic syndrome, unspecified: Secondary | ICD-10-CM

## 2018-10-26 DIAGNOSIS — Z79899 Other long term (current) drug therapy: Secondary | ICD-10-CM | POA: Diagnosis not present

## 2018-10-26 DIAGNOSIS — N183 Chronic kidney disease, stage 3 (moderate): Secondary | ICD-10-CM | POA: Diagnosis not present

## 2018-10-26 DIAGNOSIS — N189 Chronic kidney disease, unspecified: Principal | ICD-10-CM

## 2018-10-26 DIAGNOSIS — D509 Iron deficiency anemia, unspecified: Secondary | ICD-10-CM

## 2018-10-26 DIAGNOSIS — Z7982 Long term (current) use of aspirin: Secondary | ICD-10-CM | POA: Diagnosis not present

## 2018-10-26 DIAGNOSIS — I10 Essential (primary) hypertension: Secondary | ICD-10-CM

## 2018-10-26 LAB — CBC WITH DIFFERENTIAL (CANCER CENTER ONLY)
Abs Immature Granulocytes: 0.02 10*3/uL (ref 0.00–0.07)
Basophils Absolute: 0 10*3/uL (ref 0.0–0.1)
Basophils Relative: 0 %
EOS ABS: 0.1 10*3/uL (ref 0.0–0.5)
Eosinophils Relative: 2 %
HCT: 31.9 % — ABNORMAL LOW (ref 36.0–46.0)
Hemoglobin: 10.8 g/dL — ABNORMAL LOW (ref 12.0–15.0)
Immature Granulocytes: 1 %
Lymphocytes Relative: 41 %
Lymphs Abs: 1.7 10*3/uL (ref 0.7–4.0)
MCH: 28.3 pg (ref 26.0–34.0)
MCHC: 33.9 g/dL (ref 30.0–36.0)
MCV: 83.7 fL (ref 80.0–100.0)
MONOS PCT: 7 %
Monocytes Absolute: 0.3 10*3/uL (ref 0.1–1.0)
Neutro Abs: 2 10*3/uL (ref 1.7–7.7)
Neutrophils Relative %: 49 %
Platelet Count: 97 10*3/uL — ABNORMAL LOW (ref 150–400)
RBC: 3.81 MIL/uL — ABNORMAL LOW (ref 3.87–5.11)
RDW: 16.2 % — ABNORMAL HIGH (ref 11.5–15.5)
WBC Count: 4.2 10*3/uL (ref 4.0–10.5)
nRBC: 0 % (ref 0.0–0.2)

## 2018-10-26 MED ORDER — DARBEPOETIN ALFA 300 MCG/0.6ML IJ SOSY
300.0000 ug | PREFILLED_SYRINGE | INTRAMUSCULAR | Status: DC
  Administered 2018-10-26: 300 ug via SUBCUTANEOUS

## 2018-10-26 MED ORDER — DARBEPOETIN ALFA 300 MCG/0.6ML IJ SOSY
PREFILLED_SYRINGE | INTRAMUSCULAR | Status: AC
  Filled 2018-10-26: qty 0.6

## 2018-10-26 NOTE — Patient Instructions (Signed)

## 2018-10-26 NOTE — Telephone Encounter (Signed)
Scheduled appt per 01/24 los. ° °Printed calendar and avs. °

## 2018-10-26 NOTE — Progress Notes (Signed)
Hematology and Oncology Follow Up Visit  Susan [name] [id number] [birth year] 78 y.o. 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  She has element of iron deficiency, chronic disease possible early myelodysplasia.    Current therapy:  Aranesp 300 mcg every 3 weeks to keep her hemoglobin above 11.   Interim History:  Mrs. Bazinet returns today for repeat evaluation.  Since her last visit, she reports no major changes in her health.  She reports continuous benefit from Aranesp therapy with improvement in her performance status and quality of life.  She reports her that when her hemoglobin drops, she develops more dizziness and unsteadiness but none with hemoglobin above 10.  Her performance status and quality of life remains reasonable and she continues to live independently.  She is able to drive and maintains her quality of life.  She does not report any headaches, blurry vision, syncope or seizures.  He denies any lethargy or dizziness.  He has not reported any fevers or chills or sweats. She has not reported any chest pain or palpitation. Does not report any shortness of breath or wheezing. She does not report any nausea, vomiting or distention.  She denies any changes in bowel habits she does not report any frequency urgency or hesitancy.  She does not report any joint deformity or effusion.  Denies any mood changes.  Rest of her review of systems is negative.   Medications: I have reviewed the patient's current medications.  Current Outpatient Medications  Medication Sig Dispense Refill  . acetaminophen (TYLENOL) 500 MG tablet Take 500-1,000 mg by mouth every 6 (six) hours as needed (for pain.).    [name] amLODipine (NORVASC) 10 MG tablet TAKE 1/2 TABLET BY MOUTH DAILY AT BEDTIME; (TAKE 1 TABLET IF SBP IS >150) (Patient taking differently: Take 10 mg by mouth at bedtime. (TAKE 1 TABLET IF SBP IS  >150)) 90 tablet 0  . aspirin 81 MG chewable tablet Chew 81 mg by mouth daily.     . Cholecalciferol (VITAMIN D-3) 5000 UNITS TABS Take 15,000 Units by mouth daily.     . Darbepoetin Alfa (ARANESP, ALBUMIN FREE,) 300 MCG/ML SOLN Inject 300 mcg into the skin See admin instructions. USE INJECTION EVERY 3 WEEKS AS NEEDED FOR HEMOGLOBIN LESS THAN 11.    . diclofenac (VOLTAREN) 75 MG EC tablet TAKE 1 TABLET(75 MG) BY MOUTH TWICE DAILY 180 tablet 0  . diphenhydrAMINE (BENADRYL) 25 mg capsule One capsule every 6 hours x 3 days, then as needed for symptoms of rash/itching 30 capsule 0  . HYDROcodone-acetaminophen (NORCO/VICODIN) 5-325 MG tablet Take 1 tablet by mouth every 6 (six) hours as needed for moderate pain. 20 tablet 0  . hydroxypropyl methylcellulose / hypromellose (ISOPTO TEARS / GONIOVISC) 2.5 % ophthalmic solution Place 1-2 drops into both eyes 3 (three) times daily as needed for dry eyes.    [name] labetalol (NORMODYNE) 300 MG tablet TAKE 1 TABLET BY MOUTH TWICE DAILY 180 tablet 1  . levothyroxine (SYNTHROID, LEVOTHROID) 100 MCG tablet TAKE 1 TABLET BY MOUTH EVERY DAY ON AN EMPTY STOMACH FOR 30 MINUTES 90 tablet 0  . Magnesium 500 MG TABS Take 500 mg by mouth every evening.     [name] OMEPRAZOLE PO Take 1 tablet by mouth as needed.     . pantoprazole (PROTONIX) 40 MG tablet TK 1 T PO QD FOR ACID REFLUX  0   No current facility-administered  medications for this visit.      Past Medical History, Surgical history, Social history, and Family History personally reviewed again today and unchanged.  [name]  Physical Exam: Blood pressure (!) 165/96, pulse 67, temperature 98.2 F (36.8 C), temperature source Oral, resp. rate 17, height 5' 2.5" (1.588 m), weight 160 lb 9.6 oz (72.8 kg), SpO2 100 %.   ECOG: 1    General appearance: Comfortable appearing without any discomfort Head: Normocephalic without any trauma Oropharynx: Mucous membranes are moist and pink without any thrush or ulcers. Eyes: Pupils  are equal and round reactive to light. Lymph nodes: No cervical, supraclavicular, inguinal or axillary lymphadenopathy.   Heart:regular rate and rhythm.  S1 and S2 without leg edema. Lung: Clear without any rhonchi or wheezes.  No dullness to percussion. Abdomin: Soft, nontender, nondistended with good bowel sounds.  No hepatosplenomegaly. Musculoskeletal: No joint deformity or effusion.  Full range of motion noted. Neurological: No deficits noted on motor, sensory and deep tendon reflex exam. Skin: No petechial rash or dryness.  Appeared moist.  s.  Lab Results: Lab Results  Component Value Date   WBC 4.2 10/26/2018   HGB 10.8 (L) 10/26/2018   HCT 31.9 (L) 10/26/2018   MCV 83.7 10/26/2018   PLT 97 (L) 10/26/2018     Chemistry      Component Value Date/Time   NA 144 07/02/2018 0950   NA 143 06/30/2017 0846   K 3.7 07/02/2018 0950   K 4.4 06/30/2017 0846   CL 108 07/02/2018 0950   CO2 26 07/02/2018 0950   CO2 24 06/30/2017 0846   BUN 18 07/02/2018 0950   BUN 26.5 (H) 06/30/2017 0846   CREATININE 1.31 (H) 07/02/2018 0950   CREATININE 1.4 (H) 06/30/2017 0846      Component Value Date/Time   CALCIUM 9.4 07/02/2018 0950   CALCIUM 10.1 06/30/2017 0846   ALKPHOS 101 06/30/2017 0846   AST 15 07/02/2018 0950   AST 17 06/30/2017 0846   ALT 9 07/02/2018 0950   ALT <6 06/30/2017 0846   BILITOT 0.8 07/02/2018 0950   BILITOT 0.76 06/30/2017 0846      Impression and Plan:   78 year old woman with the following:  1.  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  The risks and benefits of continuing this therapy was discussed today.  Long-term complication associated with Aranesp was updated.  These issues including hypertension and thromboembolism.  At this time she is agreeable to continue given her excellent benefit and worsening symptoms of anemia if she does not get treated.  She will proceed with  Aranesp today and every 3 weeks.  She will have MD follow-up in 6 months.  2.  Hypertension: She is on adequate blood pressure medication.  Her blood pressure has been close to normal range between visits.  3.  Follow-up: Will be every 3 weeks for Aranesp.  She will have an MD follow-up in 6 months.  15  minutes was spent with the patient face-to-face today.  More than 50% of time was dedicated to reviewing disease status, treatment options, complications related therapy and answering questions regarding future plan of care.        Zola Button MD 1/24/20209:40 AM

## 2018-11-06 DIAGNOSIS — I129 Hypertensive chronic kidney disease with stage 1 through stage 4 chronic kidney disease, or unspecified chronic kidney disease: Secondary | ICD-10-CM | POA: Diagnosis not present

## 2018-11-07 ENCOUNTER — Encounter: Payer: Self-pay | Admitting: Physician Assistant

## 2018-11-09 ENCOUNTER — Ambulatory Visit: Payer: Medicare Other

## 2018-11-09 ENCOUNTER — Other Ambulatory Visit: Payer: Medicare Other

## 2018-11-16 ENCOUNTER — Encounter: Payer: Self-pay | Admitting: Oncology

## 2018-11-16 ENCOUNTER — Inpatient Hospital Stay: Payer: Medicare Other

## 2018-11-19 ENCOUNTER — Telehealth: Payer: Self-pay | Admitting: Medical Oncology

## 2018-11-19 ENCOUNTER — Inpatient Hospital Stay: Payer: Medicare Other

## 2018-11-19 ENCOUNTER — Telehealth: Payer: Self-pay | Admitting: Oncology

## 2018-11-19 ENCOUNTER — Inpatient Hospital Stay: Payer: Medicare Other | Attending: Oncology

## 2018-11-19 DIAGNOSIS — D631 Anemia in chronic kidney disease: Secondary | ICD-10-CM | POA: Diagnosis not present

## 2018-11-19 DIAGNOSIS — N189 Chronic kidney disease, unspecified: Secondary | ICD-10-CM

## 2018-11-19 DIAGNOSIS — N183 Chronic kidney disease, stage 3 (moderate): Secondary | ICD-10-CM | POA: Diagnosis not present

## 2018-11-19 LAB — CBC WITH DIFFERENTIAL (CANCER CENTER ONLY)
Abs Immature Granulocytes: 0.01 10*3/uL (ref 0.00–0.07)
Basophils Absolute: 0 10*3/uL (ref 0.0–0.1)
Basophils Relative: 0 %
Eosinophils Absolute: 0.1 10*3/uL (ref 0.0–0.5)
Eosinophils Relative: 1 %
HCT: 34.1 % — ABNORMAL LOW (ref 36.0–46.0)
Hemoglobin: 11.2 g/dL — ABNORMAL LOW (ref 12.0–15.0)
Immature Granulocytes: 0 %
Lymphocytes Relative: 57 %
Lymphs Abs: 2.4 10*3/uL (ref 0.7–4.0)
MCH: 27.4 pg (ref 26.0–34.0)
MCHC: 32.8 g/dL (ref 30.0–36.0)
MCV: 83.4 fL (ref 80.0–100.0)
Monocytes Absolute: 0.3 10*3/uL (ref 0.1–1.0)
Monocytes Relative: 8 %
Neutro Abs: 1.4 10*3/uL — ABNORMAL LOW (ref 1.7–7.7)
Neutrophils Relative %: 34 %
Platelet Count: 112 10*3/uL — ABNORMAL LOW (ref 150–400)
RBC: 4.09 MIL/uL (ref 3.87–5.11)
RDW: 16.4 % — ABNORMAL HIGH (ref 11.5–15.5)
WBC Count: 4.3 10*3/uL (ref 4.0–10.5)
nRBC: 0 % (ref 0.0–0.2)

## 2018-11-19 NOTE — Telephone Encounter (Signed)
hgb 11.2-reviewed labs w pt and told her she does not need aranesp.

## 2018-11-19 NOTE — Telephone Encounter (Signed)
Patient came in today for lab/injection missed on 2/14, however all appointments had been cancelled based on patient being marked deceased. IT made aware. Appointments restored. Patient given updated schedule.

## 2018-11-20 ENCOUNTER — Encounter: Payer: Medicare Other | Admitting: Internal Medicine

## 2018-11-30 ENCOUNTER — Other Ambulatory Visit: Payer: Medicare Other

## 2018-11-30 ENCOUNTER — Ambulatory Visit: Payer: Medicare Other

## 2018-12-07 ENCOUNTER — Inpatient Hospital Stay: Payer: Medicare Other | Attending: Oncology

## 2018-12-07 ENCOUNTER — Other Ambulatory Visit: Payer: Self-pay | Admitting: Oncology

## 2018-12-07 ENCOUNTER — Ambulatory Visit: Payer: Medicare Other

## 2018-12-07 ENCOUNTER — Other Ambulatory Visit: Payer: Medicare Other

## 2018-12-07 ENCOUNTER — Inpatient Hospital Stay: Payer: Medicare Other

## 2018-12-07 VITALS — BP 155/92 | HR 62 | Temp 97.9°F | Resp 18

## 2018-12-07 DIAGNOSIS — N189 Chronic kidney disease, unspecified: Principal | ICD-10-CM

## 2018-12-07 DIAGNOSIS — N183 Chronic kidney disease, stage 3 unspecified: Secondary | ICD-10-CM

## 2018-12-07 DIAGNOSIS — D631 Anemia in chronic kidney disease: Secondary | ICD-10-CM | POA: Diagnosis not present

## 2018-12-07 LAB — CBC WITH DIFFERENTIAL (CANCER CENTER ONLY)
Abs Immature Granulocytes: 0.03 10*3/uL (ref 0.00–0.07)
Basophils Absolute: 0 10*3/uL (ref 0.0–0.1)
Basophils Relative: 0 %
EOS ABS: 0.1 10*3/uL (ref 0.0–0.5)
Eosinophils Relative: 2 %
HCT: 30.8 % — ABNORMAL LOW (ref 36.0–46.0)
Hemoglobin: 10.3 g/dL — ABNORMAL LOW (ref 12.0–15.0)
Immature Granulocytes: 1 %
Lymphocytes Relative: 44 %
Lymphs Abs: 1.7 10*3/uL (ref 0.7–4.0)
MCH: 28.2 pg (ref 26.0–34.0)
MCHC: 33.4 g/dL (ref 30.0–36.0)
MCV: 84.4 fL (ref 80.0–100.0)
Monocytes Absolute: 0.3 10*3/uL (ref 0.1–1.0)
Monocytes Relative: 7 %
Neutro Abs: 1.8 10*3/uL (ref 1.7–7.7)
Neutrophils Relative %: 46 %
Platelet Count: 107 10*3/uL — ABNORMAL LOW (ref 150–400)
RBC: 3.65 MIL/uL — ABNORMAL LOW (ref 3.87–5.11)
RDW: 15.8 % — AB (ref 11.5–15.5)
WBC Count: 3.9 10*3/uL — ABNORMAL LOW (ref 4.0–10.5)
nRBC: 0 % (ref 0.0–0.2)

## 2018-12-07 MED ORDER — DARBEPOETIN ALFA 300 MCG/0.6ML IJ SOSY
300.0000 ug | PREFILLED_SYRINGE | Freq: Once | INTRAMUSCULAR | Status: AC
  Administered 2018-12-07: 300 ug via SUBCUTANEOUS

## 2018-12-07 MED ORDER — DARBEPOETIN ALFA 300 MCG/0.6ML IJ SOSY
PREFILLED_SYRINGE | INTRAMUSCULAR | Status: AC
  Filled 2018-12-07: qty 0.6

## 2018-12-07 NOTE — Patient Instructions (Signed)

## 2018-12-21 ENCOUNTER — Other Ambulatory Visit: Payer: Medicare Other

## 2018-12-21 ENCOUNTER — Ambulatory Visit: Payer: Medicare Other

## 2018-12-28 ENCOUNTER — Inpatient Hospital Stay: Payer: Medicare Other

## 2018-12-28 ENCOUNTER — Other Ambulatory Visit: Payer: Self-pay

## 2018-12-28 ENCOUNTER — Ambulatory Visit: Payer: Medicare Other

## 2018-12-28 ENCOUNTER — Other Ambulatory Visit: Payer: Medicare Other

## 2018-12-28 DIAGNOSIS — N183 Chronic kidney disease, stage 3 (moderate): Secondary | ICD-10-CM | POA: Diagnosis not present

## 2018-12-28 DIAGNOSIS — N189 Chronic kidney disease, unspecified: Principal | ICD-10-CM

## 2018-12-28 DIAGNOSIS — D631 Anemia in chronic kidney disease: Secondary | ICD-10-CM

## 2018-12-28 LAB — CBC WITH DIFFERENTIAL (CANCER CENTER ONLY)
ABS IMMATURE GRANULOCYTES: 0.01 10*3/uL (ref 0.00–0.07)
Basophils Absolute: 0 10*3/uL (ref 0.0–0.1)
Basophils Relative: 0 %
Eosinophils Absolute: 0.1 10*3/uL (ref 0.0–0.5)
Eosinophils Relative: 2 %
HCT: 34.5 % — ABNORMAL LOW (ref 36.0–46.0)
Hemoglobin: 11.5 g/dL — ABNORMAL LOW (ref 12.0–15.0)
Immature Granulocytes: 0 %
LYMPHS PCT: 45 %
Lymphs Abs: 1.6 10*3/uL (ref 0.7–4.0)
MCH: 28 pg (ref 26.0–34.0)
MCHC: 33.3 g/dL (ref 30.0–36.0)
MCV: 83.9 fL (ref 80.0–100.0)
Monocytes Absolute: 0.4 10*3/uL (ref 0.1–1.0)
Monocytes Relative: 10 %
Neutro Abs: 1.5 10*3/uL — ABNORMAL LOW (ref 1.7–7.7)
Neutrophils Relative %: 43 %
Platelet Count: 90 10*3/uL — ABNORMAL LOW (ref 150–400)
RBC: 4.11 MIL/uL (ref 3.87–5.11)
RDW: 16.1 % — ABNORMAL HIGH (ref 11.5–15.5)
WBC Count: 3.4 10*3/uL — ABNORMAL LOW (ref 4.0–10.5)
nRBC: 0 % (ref 0.0–0.2)

## 2018-12-28 MED ORDER — DARBEPOETIN ALFA 300 MCG/0.6ML IJ SOSY
PREFILLED_SYRINGE | INTRAMUSCULAR | Status: AC
  Filled 2018-12-28: qty 0.6

## 2018-12-28 NOTE — Progress Notes (Signed)
Pt did not meet parameters for injection. Hgb 11.5. Lab results were given to pt and advised if there are any concerns to call [facility],. Ebrahim Deremer LPN

## 2019-01-18 ENCOUNTER — Other Ambulatory Visit: Payer: Self-pay

## 2019-01-18 ENCOUNTER — Inpatient Hospital Stay: Payer: Medicare Other | Attending: Oncology

## 2019-01-18 ENCOUNTER — Other Ambulatory Visit: Payer: Medicare Other

## 2019-01-18 ENCOUNTER — Ambulatory Visit: Payer: Medicare Other

## 2019-01-18 ENCOUNTER — Inpatient Hospital Stay: Payer: Medicare Other

## 2019-01-18 VITALS — BP 152/90 | HR 66 | Temp 98.1°F | Resp 18

## 2019-01-18 DIAGNOSIS — N183 Chronic kidney disease, stage 3 unspecified: Secondary | ICD-10-CM

## 2019-01-18 DIAGNOSIS — D631 Anemia in chronic kidney disease: Secondary | ICD-10-CM | POA: Insufficient documentation

## 2019-01-18 DIAGNOSIS — I129 Hypertensive chronic kidney disease with stage 1 through stage 4 chronic kidney disease, or unspecified chronic kidney disease: Secondary | ICD-10-CM | POA: Insufficient documentation

## 2019-01-18 DIAGNOSIS — N189 Chronic kidney disease, unspecified: Secondary | ICD-10-CM

## 2019-01-18 LAB — CBC WITH DIFFERENTIAL (CANCER CENTER ONLY)
Abs Immature Granulocytes: 0.01 10*3/uL (ref 0.00–0.07)
Basophils Absolute: 0 10*3/uL (ref 0.0–0.1)
Basophils Relative: 0 %
Eosinophils Absolute: 0.1 10*3/uL (ref 0.0–0.5)
Eosinophils Relative: 2 %
HCT: 32.8 % — ABNORMAL LOW (ref 36.0–46.0)
Hemoglobin: 10.9 g/dL — ABNORMAL LOW (ref 12.0–15.0)
Immature Granulocytes: 0 %
Lymphocytes Relative: 35 %
Lymphs Abs: 1.6 10*3/uL (ref 0.7–4.0)
MCH: 28.1 pg (ref 26.0–34.0)
MCHC: 33.2 g/dL (ref 30.0–36.0)
MCV: 84.5 fL (ref 80.0–100.0)
Monocytes Absolute: 0.3 10*3/uL (ref 0.1–1.0)
Monocytes Relative: 6 %
Neutro Abs: 2.7 10*3/uL (ref 1.7–7.7)
Neutrophils Relative %: 57 %
Platelet Count: 145 10*3/uL — ABNORMAL LOW (ref 150–400)
RBC: 3.88 MIL/uL (ref 3.87–5.11)
RDW: 15.3 % (ref 11.5–15.5)
WBC Count: 4.7 10*3/uL (ref 4.0–10.5)
nRBC: 0 % (ref 0.0–0.2)

## 2019-01-18 MED ORDER — DARBEPOETIN ALFA 300 MCG/0.6ML IJ SOSY
300.0000 ug | PREFILLED_SYRINGE | Freq: Once | INTRAMUSCULAR | Status: AC
  Administered 2019-01-18: 300 ug via SUBCUTANEOUS

## 2019-01-18 MED ORDER — DARBEPOETIN ALFA 300 MCG/0.6ML IJ SOSY
PREFILLED_SYRINGE | INTRAMUSCULAR | Status: AC
  Filled 2019-01-18: qty 0.6

## 2019-01-30 ENCOUNTER — Encounter: Payer: Self-pay | Admitting: Internal Medicine

## 2019-02-08 ENCOUNTER — Other Ambulatory Visit: Payer: Medicare Other

## 2019-02-08 ENCOUNTER — Inpatient Hospital Stay: Payer: Medicare Other

## 2019-02-08 ENCOUNTER — Inpatient Hospital Stay: Payer: Medicare Other | Attending: Oncology

## 2019-02-08 ENCOUNTER — Ambulatory Visit: Payer: Medicare Other

## 2019-02-08 DIAGNOSIS — N183 Chronic kidney disease, stage 3 (moderate): Secondary | ICD-10-CM | POA: Insufficient documentation

## 2019-02-08 DIAGNOSIS — D631 Anemia in chronic kidney disease: Secondary | ICD-10-CM | POA: Insufficient documentation

## 2019-02-20 ENCOUNTER — Telehealth: Payer: Self-pay | Admitting: Oncology

## 2019-02-20 NOTE — Telephone Encounter (Signed)
Called pt per 5/20 sch message- no answer - left message for patient to call back for reschedule.

## 2019-03-01 ENCOUNTER — Inpatient Hospital Stay: Payer: Medicare Other

## 2019-03-01 ENCOUNTER — Other Ambulatory Visit: Payer: Medicare Other

## 2019-03-01 ENCOUNTER — Ambulatory Visit: Payer: Medicare Other

## 2019-03-01 ENCOUNTER — Other Ambulatory Visit: Payer: Self-pay

## 2019-03-01 VITALS — BP 160/98 | HR 84 | Temp 98.1°F | Resp 18

## 2019-03-01 DIAGNOSIS — D631 Anemia in chronic kidney disease: Secondary | ICD-10-CM

## 2019-03-01 DIAGNOSIS — N183 Chronic kidney disease, stage 3 unspecified: Secondary | ICD-10-CM

## 2019-03-01 DIAGNOSIS — N189 Chronic kidney disease, unspecified: Secondary | ICD-10-CM

## 2019-03-01 LAB — CBC WITH DIFFERENTIAL (CANCER CENTER ONLY)
Abs Immature Granulocytes: 0.02 10*3/uL (ref 0.00–0.07)
Basophils Absolute: 0 10*3/uL (ref 0.0–0.1)
Basophils Relative: 0 %
Eosinophils Absolute: 0.1 10*3/uL (ref 0.0–0.5)
Eosinophils Relative: 3 %
HCT: 36.9 % (ref 36.0–46.0)
Hemoglobin: 12.3 g/dL (ref 12.0–15.0)
Immature Granulocytes: 1 %
Lymphocytes Relative: 34 %
Lymphs Abs: 1.4 10*3/uL (ref 0.7–4.0)
MCH: 29 pg (ref 26.0–34.0)
MCHC: 33.3 g/dL (ref 30.0–36.0)
MCV: 87 fL (ref 80.0–100.0)
Monocytes Absolute: 0.3 10*3/uL (ref 0.1–1.0)
Monocytes Relative: 8 %
Neutro Abs: 2.3 10*3/uL (ref 1.7–7.7)
Neutrophils Relative %: 54 %
Platelet Count: 127 10*3/uL — ABNORMAL LOW (ref 150–400)
RBC: 4.24 MIL/uL (ref 3.87–5.11)
RDW: 14.6 % (ref 11.5–15.5)
WBC Count: 4.1 10*3/uL (ref 4.0–10.5)
nRBC: 0 % (ref 0.0–0.2)

## 2019-03-01 MED ORDER — DARBEPOETIN ALFA 300 MCG/0.6ML IJ SOSY
PREFILLED_SYRINGE | INTRAMUSCULAR | Status: AC
  Filled 2019-03-01: qty 0.6

## 2019-03-01 MED ORDER — DARBEPOETIN ALFA 300 MCG/0.6ML IJ SOSY: 300.0000 ug | PREFILLED_SYRINGE | Freq: Once | INTRAMUSCULAR | Status: DC

## 2019-03-04 ENCOUNTER — Ambulatory Visit: Payer: Self-pay | Admitting: Adult Health

## 2019-03-22 ENCOUNTER — Inpatient Hospital Stay: Payer: Medicare Other

## 2019-03-22 ENCOUNTER — Telehealth: Payer: Self-pay | Admitting: Oncology

## 2019-03-22 ENCOUNTER — Other Ambulatory Visit: Payer: Medicare Other

## 2019-03-22 ENCOUNTER — Ambulatory Visit: Payer: Medicare Other

## 2019-03-22 NOTE — Telephone Encounter (Signed)
Returned call re rescheduling 6/19 appointments. Gave patient new appointment for 6/22.

## 2019-03-25 ENCOUNTER — Inpatient Hospital Stay: Payer: Medicare Other | Attending: Oncology

## 2019-03-25 ENCOUNTER — Other Ambulatory Visit: Payer: Self-pay

## 2019-03-25 ENCOUNTER — Inpatient Hospital Stay: Payer: Medicare Other

## 2019-03-25 VITALS — BP 152/90 | HR 73 | Temp 98.5°F | Resp 18

## 2019-03-25 DIAGNOSIS — N189 Chronic kidney disease, unspecified: Secondary | ICD-10-CM

## 2019-03-25 DIAGNOSIS — N183 Chronic kidney disease, stage 3 unspecified: Secondary | ICD-10-CM

## 2019-03-25 DIAGNOSIS — D631 Anemia in chronic kidney disease: Secondary | ICD-10-CM | POA: Diagnosis not present

## 2019-03-25 LAB — CBC WITH DIFFERENTIAL (CANCER CENTER ONLY)
Abs Immature Granulocytes: 0 10*3/uL (ref 0.00–0.07)
Basophils Absolute: 0 10*3/uL (ref 0.0–0.1)
Basophils Relative: 0 %
Eosinophils Absolute: 0.1 10*3/uL (ref 0.0–0.5)
Eosinophils Relative: 3 %
HCT: 28.1 % — ABNORMAL LOW (ref 36.0–46.0)
Hemoglobin: 9.5 g/dL — ABNORMAL LOW (ref 12.0–15.0)
Immature Granulocytes: 0 %
Lymphocytes Relative: 44 %
Lymphs Abs: 1.7 10*3/uL (ref 0.7–4.0)
MCH: 29.3 pg (ref 26.0–34.0)
MCHC: 33.8 g/dL (ref 30.0–36.0)
MCV: 86.7 fL (ref 80.0–100.0)
Monocytes Absolute: 0.3 10*3/uL (ref 0.1–1.0)
Monocytes Relative: 8 %
Neutro Abs: 1.7 10*3/uL (ref 1.7–7.7)
Neutrophils Relative %: 45 %
Platelet Count: 109 10*3/uL — ABNORMAL LOW (ref 150–400)
RBC: 3.24 MIL/uL — ABNORMAL LOW (ref 3.87–5.11)
RDW: 13.9 % (ref 11.5–15.5)
WBC Count: 3.8 10*3/uL — ABNORMAL LOW (ref 4.0–10.5)
nRBC: 0 % (ref 0.0–0.2)

## 2019-03-25 MED ORDER — DARBEPOETIN ALFA 300 MCG/0.6ML IJ SOSY
PREFILLED_SYRINGE | INTRAMUSCULAR | Status: AC
  Filled 2019-03-25: qty 0.6

## 2019-03-25 MED ORDER — DARBEPOETIN ALFA 300 MCG/0.6ML IJ SOSY
300.0000 ug | PREFILLED_SYRINGE | Freq: Once | INTRAMUSCULAR | Status: AC
  Administered 2019-03-25: 300 ug via SUBCUTANEOUS

## 2019-03-25 NOTE — Patient Instructions (Signed)

## 2019-04-12 ENCOUNTER — Ambulatory Visit: Payer: Medicare Other

## 2019-04-12 ENCOUNTER — Inpatient Hospital Stay: Payer: Medicare Other

## 2019-04-12 ENCOUNTER — Inpatient Hospital Stay: Payer: Medicare Other | Attending: Oncology

## 2019-04-12 ENCOUNTER — Other Ambulatory Visit: Payer: Medicare Other

## 2019-04-12 ENCOUNTER — Other Ambulatory Visit: Payer: Self-pay

## 2019-04-12 VITALS — BP 155/87 | HR 88 | Temp 98.7°F | Resp 18

## 2019-04-12 DIAGNOSIS — D631 Anemia in chronic kidney disease: Secondary | ICD-10-CM

## 2019-04-12 DIAGNOSIS — N183 Chronic kidney disease, stage 3 unspecified: Secondary | ICD-10-CM

## 2019-04-12 DIAGNOSIS — N189 Chronic kidney disease, unspecified: Secondary | ICD-10-CM

## 2019-04-12 LAB — CBC WITH DIFFERENTIAL (CANCER CENTER ONLY)
Abs Immature Granulocytes: 0.01 10*3/uL (ref 0.00–0.07)
Basophils Absolute: 0 10*3/uL (ref 0.0–0.1)
Basophils Relative: 0 %
Eosinophils Absolute: 0 10*3/uL (ref 0.0–0.5)
Eosinophils Relative: 1 %
HCT: 31.2 % — ABNORMAL LOW (ref 36.0–46.0)
Hemoglobin: 10.6 g/dL — ABNORMAL LOW (ref 12.0–15.0)
Immature Granulocytes: 0 %
Lymphocytes Relative: 41 %
Lymphs Abs: 1.6 10*3/uL (ref 0.7–4.0)
MCH: 29.3 pg (ref 26.0–34.0)
MCHC: 34 g/dL (ref 30.0–36.0)
MCV: 86.2 fL (ref 80.0–100.0)
Monocytes Absolute: 0.3 10*3/uL (ref 0.1–1.0)
Monocytes Relative: 8 %
Neutro Abs: 1.9 10*3/uL (ref 1.7–7.7)
Neutrophils Relative %: 50 %
Platelet Count: 135 10*3/uL — ABNORMAL LOW (ref 150–400)
RBC: 3.62 MIL/uL — ABNORMAL LOW (ref 3.87–5.11)
RDW: 15 % (ref 11.5–15.5)
WBC Count: 3.9 10*3/uL — ABNORMAL LOW (ref 4.0–10.5)
nRBC: 0 % (ref 0.0–0.2)

## 2019-04-12 LAB — CMP (CANCER CENTER ONLY)
ALT: 8 U/L (ref 0–44)
AST: 20 U/L (ref 15–41)
Albumin: 4.2 g/dL (ref 3.5–5.0)
Alkaline Phosphatase: 84 U/L (ref 38–126)
Anion gap: 10 (ref 5–15)
BUN: 18 mg/dL (ref 8–23)
CO2: 24 mmol/L (ref 22–32)
Calcium: 9.5 mg/dL (ref 8.9–10.3)
Chloride: 107 mmol/L (ref 98–111)
Creatinine: 1.26 mg/dL — ABNORMAL HIGH (ref 0.44–1.00)
GFR, Est AFR Am: 47 mL/min — ABNORMAL LOW (ref >60)
GFR, Estimated: 40 mL/min — ABNORMAL LOW (ref >60)
Glucose, Bld: 97 mg/dL (ref 70–99)
Potassium: 3.6 mmol/L (ref 3.5–5.1)
Sodium: 141 mmol/L (ref 135–145)
Total Bilirubin: 0.8 mg/dL (ref 0.3–1.2)
Total Protein: 8.2 g/dL — ABNORMAL HIGH (ref 6.5–8.1)

## 2019-04-12 LAB — IRON AND TIBC
Iron: 56 ug/dL (ref 41–142)
Saturation Ratios: 24 % (ref 21–57)
TIBC: 233 ug/dL — ABNORMAL LOW (ref 236–444)
UIBC: 176 ug/dL (ref 120–384)

## 2019-04-12 LAB — FERRITIN: Ferritin: 147 ng/mL (ref 11–307)

## 2019-04-12 MED ORDER — DARBEPOETIN ALFA 300 MCG/0.6ML IJ SOSY
300.0000 ug | PREFILLED_SYRINGE | Freq: Once | INTRAMUSCULAR | Status: AC
  Administered 2019-04-12: 09:00:00 300 ug via SUBCUTANEOUS

## 2019-04-12 MED ORDER — DARBEPOETIN ALFA 300 MCG/0.6ML IJ SOSY
PREFILLED_SYRINGE | INTRAMUSCULAR | Status: AC
  Filled 2019-04-12: qty 0.6

## 2019-04-16 DIAGNOSIS — Z79899 Other long term (current) drug therapy: Secondary | ICD-10-CM | POA: Diagnosis not present

## 2019-05-03 ENCOUNTER — Inpatient Hospital Stay: Payer: Medicare Other

## 2019-05-03 ENCOUNTER — Other Ambulatory Visit: Payer: Medicare Other

## 2019-05-03 ENCOUNTER — Telehealth: Payer: Self-pay

## 2019-05-03 ENCOUNTER — Telehealth: Payer: Self-pay | Admitting: Oncology

## 2019-05-03 ENCOUNTER — Inpatient Hospital Stay: Payer: Medicare Other | Admitting: Oncology

## 2019-05-03 ENCOUNTER — Ambulatory Visit: Payer: Medicare Other | Admitting: Oncology

## 2019-05-03 ENCOUNTER — Ambulatory Visit: Payer: Medicare Other

## 2019-05-03 NOTE — Telephone Encounter (Signed)
Contacted patient since she missed her lab/MD/Inj appt this morning. Patient stated that she forgot and would like to reschedule. Scheduling message sent.

## 2019-05-03 NOTE — Telephone Encounter (Signed)
Spoke with patient son - he is aware of appt date and time. R/s from 7/31 to 8/5

## 2019-05-08 ENCOUNTER — Inpatient Hospital Stay: Payer: Medicare Other | Attending: Oncology | Admitting: Oncology

## 2019-05-08 ENCOUNTER — Inpatient Hospital Stay: Payer: Medicare Other

## 2019-05-08 ENCOUNTER — Other Ambulatory Visit: Payer: Self-pay

## 2019-05-08 VITALS — BP 160/74 | HR 77 | Temp 98.7°F | Resp 17 | Ht 62.5 in | Wt 142.4 lb

## 2019-05-08 DIAGNOSIS — Z79899 Other long term (current) drug therapy: Secondary | ICD-10-CM | POA: Insufficient documentation

## 2019-05-08 DIAGNOSIS — R634 Abnormal weight loss: Secondary | ICD-10-CM | POA: Diagnosis not present

## 2019-05-08 DIAGNOSIS — D631 Anemia in chronic kidney disease: Secondary | ICD-10-CM | POA: Diagnosis not present

## 2019-05-08 DIAGNOSIS — D509 Iron deficiency anemia, unspecified: Secondary | ICD-10-CM | POA: Diagnosis not present

## 2019-05-08 DIAGNOSIS — N183 Chronic kidney disease, stage 3 (moderate): Secondary | ICD-10-CM | POA: Insufficient documentation

## 2019-05-08 DIAGNOSIS — I129 Hypertensive chronic kidney disease with stage 1 through stage 4 chronic kidney disease, or unspecified chronic kidney disease: Secondary | ICD-10-CM | POA: Insufficient documentation

## 2019-05-08 LAB — CBC WITH DIFFERENTIAL (CANCER CENTER ONLY)
Abs Immature Granulocytes: 0.02 10*3/uL (ref 0.00–0.07)
Basophils Absolute: 0 10*3/uL (ref 0.0–0.1)
Basophils Relative: 0 %
Eosinophils Absolute: 0.1 10*3/uL (ref 0.0–0.5)
Eosinophils Relative: 1 %
HCT: 39.7 % (ref 36.0–46.0)
Hemoglobin: 13 g/dL (ref 12.0–15.0)
Immature Granulocytes: 0 %
Lymphocytes Relative: 32 %
Lymphs Abs: 1.5 10*3/uL (ref 0.7–4.0)
MCH: 28.9 pg (ref 26.0–34.0)
MCHC: 32.7 g/dL (ref 30.0–36.0)
MCV: 88.2 fL (ref 80.0–100.0)
Monocytes Absolute: 0.4 10*3/uL (ref 0.1–1.0)
Monocytes Relative: 8 %
Neutro Abs: 2.7 10*3/uL (ref 1.7–7.7)
Neutrophils Relative %: 59 %
Platelet Count: 109 10*3/uL — ABNORMAL LOW (ref 150–400)
RBC: 4.5 MIL/uL (ref 3.87–5.11)
RDW: 15.2 % (ref 11.5–15.5)
WBC Count: 4.6 10*3/uL (ref 4.0–10.5)
nRBC: 0 % (ref 0.0–0.2)

## 2019-05-08 NOTE — Progress Notes (Signed)
Hematology and Oncology Follow Up Visit  Susan [name] [id number] [birth year] 78 y.o. 05/08/2019 11:43 AM Patient, No Pcp PerMcKeown, [name], MD   Principle Diagnosis:  78 year old woman with multifactorial anemia related to renal disease as well as iron deficiency diagnosed in 2010.  .    Current therapy:  Aranesp 300 mcg every 3 weeks to keep her hemoglobin above 11.   Interim History:  Susan [name] is here for a follow-up.  Since the last visit, she reports no major changes in her health.  She did report fluctuating appetite has lost some weight but she is using nutritional supplements which helped supplement her diet.  She denies any worsening dizziness, fatigue or tiredness.  She denies any recent exacerbation of gout.  She denies any complications related to Aranesp at this time.  She denies any injection related issues or thrombosis.    She denied any alteration mental status, neuropathy, confusion or dizziness.  Denies any headaches or lethargy.  Denies any night sweats, weight loss or changes in appetite.  Denied orthopnea, dyspnea on exertion or chest discomfort.  Denies shortness of breath, difficulty breathing hemoptysis or cough.  Denies any abdominal distention, nausea, early satiety or dyspepsia.  Denies any hematuria, frequency, dysuria or nocturia.  Denies any skin irritation, dryness or rash.  Denies any ecchymosis or petechiae.  Denies any lymphadenopathy or clotting.  Denies any heat or cold intolerance.  Denies any anxiety or depression.  Remaining review of system is negative.       Medications: Updated today and reviewed. Current Outpatient Medications  Medication Sig Dispense Refill  . acetaminophen (TYLENOL) 500 MG tablet Take 500-1,000 mg by mouth every 6 (six) hours as needed (for pain.).    [name] amLODipine (NORVASC) 10 MG tablet TAKE 1/2 TABLET BY MOUTH DAILY AT BEDTIME; (TAKE 1 TABLET IF SBP IS >150) (Patient taking differently: Take 10 mg by mouth at bedtime. (TAKE 1  TABLET IF SBP IS >150)) 90 tablet 0  . aspirin 81 MG chewable tablet Chew 81 mg by mouth daily.     . Cholecalciferol (VITAMIN D-3) 5000 UNITS TABS Take 15,000 Units by mouth daily.     . Darbepoetin Alfa (ARANESP, ALBUMIN FREE,) 300 MCG/ML SOLN Inject 300 mcg into the skin See admin instructions. USE INJECTION EVERY 3 WEEKS AS NEEDED FOR HEMOGLOBIN LESS THAN 11.    . diclofenac (VOLTAREN) 75 MG EC tablet TAKE 1 TABLET(75 MG) BY MOUTH TWICE DAILY 180 tablet 0  . diphenhydrAMINE (BENADRYL) 25 mg capsule One capsule every 6 hours x 3 days, then as needed for symptoms of rash/itching 30 capsule 0  . HYDROcodone-acetaminophen (NORCO/VICODIN) 5-325 MG tablet Take 1 tablet by mouth every 6 (six) hours as needed for moderate pain. 20 tablet 0  . hydroxypropyl methylcellulose / hypromellose (ISOPTO TEARS / GONIOVISC) 2.5 % ophthalmic solution Place 1-2 drops into both eyes 3 (three) times daily as needed for dry eyes.    [name] labetalol (NORMODYNE) 300 MG tablet TAKE 1 TABLET BY MOUTH TWICE DAILY 180 tablet 1  . levothyroxine (SYNTHROID, LEVOTHROID) 100 MCG tablet TAKE 1 TABLET BY MOUTH EVERY DAY ON AN EMPTY STOMACH FOR 30 MINUTES 90 tablet 0  . Magnesium 500 MG TABS Take 500 mg by mouth every evening.     [name] OMEPRAZOLE PO Take 1 tablet by mouth as needed.     . pantoprazole (PROTONIX) 40 MG tablet TK 1 T PO QD FOR ACID REFLUX  0   No current facility-administered medications for  this visit.      Past Medical History, Surgical history, Social history, and Family History updated today without any changes.  .  Physical Exam: Blood pressure (!) 160/74, pulse 77, temperature 98.7 F (37.1 C), temperature source Oral, resp. rate 17, height 5' 2.5" (1.588 m), weight 142 lb 6.4 oz (64.6 kg), SpO2 100 %.   ECOG: 1    General appearance: Alert, awake without any distress. Head: Atraumatic without abnormalities Oropharynx: Without any thrush or ulcers. Eyes: No scleral icterus. Lymph nodes: No  lymphadenopathy noted in the cervical, supraclavicular, or axillary nodes Heart:regular rate and rhythm, without any murmurs or gallops.   Lung: Clear to auscultation without any rhonchi, wheezes or dullness to percussion. Abdomin: Soft, nontender without any shifting dullness or ascites. Musculoskeletal: No clubbing or cyanosis. Neurological: No motor or sensory deficits. Skin: No rashes or lesions. Psychiatric: Mood and affect appeared normal.     Lab Results: Lab Results  Component Value Date   WBC 4.6 05/08/2019   HGB 13.0 05/08/2019   HCT 39.7 05/08/2019   MCV 88.2 05/08/2019   PLT 109 (L) 05/08/2019     Chemistry      Component Value Date/Time   NA 141 04/12/2019 0804   NA 143 06/30/2017 0846   K 3.6 04/12/2019 0804   K 4.4 06/30/2017 0846   CL 107 04/12/2019 0804   CO2 24 04/12/2019 0804   CO2 24 06/30/2017 0846   BUN 18 04/12/2019 0804   BUN 26.5 (H) 06/30/2017 0846   CREATININE 1.26 (H) 04/12/2019 0804   CREATININE 1.31 (H) 07/02/2018 0950   CREATININE 1.4 (H) 06/30/2017 0846      Component Value Date/Time   CALCIUM 9.5 04/12/2019 0804   CALCIUM 10.1 06/30/2017 0846   ALKPHOS 84 04/12/2019 0804   ALKPHOS 101 06/30/2017 0846   AST 20 04/12/2019 0804   AST 17 06/30/2017 0846   ALT 8 04/12/2019 0804   ALT <6 06/30/2017 0846   BILITOT 0.8 04/12/2019 0804   BILITOT 0.76 06/30/2017 0846      Impression and Plan:   78 year old woman with:   1.  Multifactorial anemia due to chronic renal insufficiency and iron deficiency.    She continues to be on Aranesp which she has tolerated very well with excellent response in her hemoglobin.  Her hemoglobin today is is up to 13 and does not require any Aranesp.  Risks and benefits of continuing this therapy long-term was discussed.  Potential complications include thrombosis and hypertension also reiterated.  At this time she is willing to continue.  Iron studies were also checked on 04/22/2019 and did not require any  additional iron supplementation.  2.  Hypertension: Mildly elevated today but blood pressure has been reasonably controlled otherwise.  3.  Weight loss: We have discussed strategies to boost her intake including nutritional supplements which she is trying to do.  4.  Follow-up: Every 3 weeks for lab and Aranesp and MD follow-up in 4 to 6 months.  15  minutes was spent with the patient face-to-face today.  More than 50% of time was spent on reviewing her laboratory data, treatment options and complications related to current therapy.        Zola Button MD 8/5/202011:43 AM

## 2019-05-09 ENCOUNTER — Telehealth: Payer: Self-pay | Admitting: Oncology

## 2019-05-09 NOTE — Telephone Encounter (Signed)
Called and spoke with patient. Confirmed august and sept appts

## 2019-05-24 ENCOUNTER — Inpatient Hospital Stay: Payer: Medicare Other

## 2019-05-24 ENCOUNTER — Other Ambulatory Visit: Payer: Medicare Other

## 2019-05-24 ENCOUNTER — Ambulatory Visit: Payer: Medicare Other

## 2019-05-24 DIAGNOSIS — M2011 Hallux valgus (acquired), right foot: Secondary | ICD-10-CM | POA: Diagnosis not present

## 2019-05-24 DIAGNOSIS — F0390 Unspecified dementia without behavioral disturbance: Secondary | ICD-10-CM | POA: Diagnosis not present

## 2019-05-24 DIAGNOSIS — M109 Gout, unspecified: Secondary | ICD-10-CM | POA: Diagnosis not present

## 2019-05-24 DIAGNOSIS — M79671 Pain in right foot: Secondary | ICD-10-CM | POA: Diagnosis not present

## 2019-05-24 DIAGNOSIS — M25571 Pain in right ankle and joints of right foot: Secondary | ICD-10-CM | POA: Diagnosis not present

## 2019-05-24 DIAGNOSIS — I10 Essential (primary) hypertension: Secondary | ICD-10-CM | POA: Diagnosis not present

## 2019-05-24 DIAGNOSIS — R4182 Altered mental status, unspecified: Secondary | ICD-10-CM | POA: Diagnosis not present

## 2019-05-24 DIAGNOSIS — M7989 Other specified soft tissue disorders: Secondary | ICD-10-CM | POA: Diagnosis not present

## 2019-05-24 DIAGNOSIS — Z880 Allergy status to penicillin: Secondary | ICD-10-CM | POA: Diagnosis not present

## 2019-05-24 DIAGNOSIS — R229 Localized swelling, mass and lump, unspecified: Secondary | ICD-10-CM | POA: Diagnosis not present

## 2019-05-28 DIAGNOSIS — D631 Anemia in chronic kidney disease: Secondary | ICD-10-CM | POA: Diagnosis not present

## 2019-05-28 DIAGNOSIS — I10 Essential (primary) hypertension: Secondary | ICD-10-CM | POA: Diagnosis not present

## 2019-05-28 DIAGNOSIS — M109 Gout, unspecified: Secondary | ICD-10-CM | POA: Insufficient documentation

## 2019-05-28 DIAGNOSIS — N189 Chronic kidney disease, unspecified: Secondary | ICD-10-CM | POA: Diagnosis not present

## 2019-05-28 DIAGNOSIS — N183 Chronic kidney disease, stage 3 (moderate): Secondary | ICD-10-CM | POA: Diagnosis not present

## 2019-05-28 DIAGNOSIS — D469 Myelodysplastic syndrome, unspecified: Secondary | ICD-10-CM | POA: Diagnosis not present

## 2019-05-28 DIAGNOSIS — E038 Other specified hypothyroidism: Secondary | ICD-10-CM | POA: Diagnosis not present

## 2019-05-28 DIAGNOSIS — G3109 Other frontotemporal dementia: Secondary | ICD-10-CM | POA: Diagnosis not present

## 2019-05-28 DIAGNOSIS — F0280 Dementia in other diseases classified elsewhere without behavioral disturbance: Secondary | ICD-10-CM | POA: Diagnosis not present

## 2019-05-29 ENCOUNTER — Telehealth: Payer: Self-pay

## 2019-05-29 ENCOUNTER — Inpatient Hospital Stay: Payer: Medicare Other

## 2019-05-29 NOTE — Telephone Encounter (Signed)
Received call from patient son Duwaine Maxin stating that he has moved the patient in with him in [place] since the patient needed more help and care. He has established her with a PCP and is also setting her up with a hematologist at [facility] in [place]. Selinda Flavin stated that he has already contacted the [facility] and they are accepting the patient as a new patient. Selinda Flavin is aware to contact with any further assistance needed for transfer of care. Dr. Alen Blew made aware and all future appointments will be canceled.

## 2019-06-03 DIAGNOSIS — F0390 Unspecified dementia without behavioral disturbance: Secondary | ICD-10-CM | POA: Diagnosis not present

## 2019-06-03 DIAGNOSIS — R1013 Epigastric pain: Secondary | ICD-10-CM | POA: Diagnosis not present

## 2019-06-03 DIAGNOSIS — R634 Abnormal weight loss: Secondary | ICD-10-CM | POA: Diagnosis not present

## 2019-06-12 DIAGNOSIS — F0280 Dementia in other diseases classified elsewhere without behavioral disturbance: Secondary | ICD-10-CM | POA: Insufficient documentation

## 2019-06-14 ENCOUNTER — Other Ambulatory Visit: Payer: Medicare Other

## 2019-06-14 ENCOUNTER — Ambulatory Visit: Payer: Medicare Other

## 2019-06-24 DIAGNOSIS — E039 Hypothyroidism, unspecified: Secondary | ICD-10-CM | POA: Diagnosis not present

## 2019-06-24 DIAGNOSIS — M109 Gout, unspecified: Secondary | ICD-10-CM | POA: Diagnosis not present

## 2019-07-05 ENCOUNTER — Other Ambulatory Visit: Payer: Medicare Other

## 2019-07-05 ENCOUNTER — Ambulatory Visit: Payer: Medicare Other

## 2019-07-26 ENCOUNTER — Ambulatory Visit: Payer: Medicare Other

## 2019-07-26 ENCOUNTER — Other Ambulatory Visit: Payer: Medicare Other

## 2019-08-01 DIAGNOSIS — M25531 Pain in right wrist: Secondary | ICD-10-CM | POA: Diagnosis not present

## 2019-08-01 DIAGNOSIS — Z79899 Other long term (current) drug therapy: Secondary | ICD-10-CM | POA: Diagnosis not present

## 2019-08-01 DIAGNOSIS — M109 Gout, unspecified: Secondary | ICD-10-CM | POA: Diagnosis not present

## 2019-08-02 ENCOUNTER — Inpatient Hospital Stay: Payer: Medicare Other | Attending: Oncology

## 2019-08-02 ENCOUNTER — Inpatient Hospital Stay: Payer: Medicare Other

## 2019-08-02 ENCOUNTER — Other Ambulatory Visit: Payer: Self-pay

## 2019-08-02 VITALS — BP 158/83 | HR 64 | Temp 98.3°F | Resp 18

## 2019-08-02 DIAGNOSIS — D631 Anemia in chronic kidney disease: Secondary | ICD-10-CM | POA: Diagnosis present

## 2019-08-02 DIAGNOSIS — E611 Iron deficiency: Secondary | ICD-10-CM | POA: Diagnosis not present

## 2019-08-02 DIAGNOSIS — N1831 Chronic kidney disease, stage 3a: Secondary | ICD-10-CM

## 2019-08-02 DIAGNOSIS — N189 Chronic kidney disease, unspecified: Secondary | ICD-10-CM

## 2019-08-02 DIAGNOSIS — N183 Chronic kidney disease, stage 3 unspecified: Secondary | ICD-10-CM | POA: Diagnosis present

## 2019-08-02 DIAGNOSIS — I129 Hypertensive chronic kidney disease with stage 1 through stage 4 chronic kidney disease, or unspecified chronic kidney disease: Secondary | ICD-10-CM | POA: Diagnosis present

## 2019-08-02 DIAGNOSIS — D509 Iron deficiency anemia, unspecified: Secondary | ICD-10-CM

## 2019-08-02 DIAGNOSIS — Z79899 Other long term (current) drug therapy: Secondary | ICD-10-CM | POA: Diagnosis not present

## 2019-08-02 LAB — CBC WITH DIFFERENTIAL (CANCER CENTER ONLY)
Abs Immature Granulocytes: 0.02 10*3/uL (ref 0.00–0.07)
Basophils Absolute: 0 10*3/uL (ref 0.0–0.1)
Basophils Relative: 0 %
Eosinophils Absolute: 0 10*3/uL (ref 0.0–0.5)
Eosinophils Relative: 0 %
HCT: 30.4 % — ABNORMAL LOW (ref 36.0–46.0)
Hemoglobin: 10.3 g/dL — ABNORMAL LOW (ref 12.0–15.0)
Immature Granulocytes: 0 %
Lymphocytes Relative: 27 %
Lymphs Abs: 1.5 10*3/uL (ref 0.7–4.0)
MCH: 28.4 pg (ref 26.0–34.0)
MCHC: 33.9 g/dL (ref 30.0–36.0)
MCV: 83.7 fL (ref 80.0–100.0)
Monocytes Absolute: 0.5 10*3/uL (ref 0.1–1.0)
Monocytes Relative: 9 %
Neutro Abs: 3.6 10*3/uL (ref 1.7–7.7)
Neutrophils Relative %: 64 %
Platelet Count: 145 10*3/uL — ABNORMAL LOW (ref 150–400)
RBC: 3.63 MIL/uL — ABNORMAL LOW (ref 3.87–5.11)
RDW: 14.6 % (ref 11.5–15.5)
WBC Count: 5.7 10*3/uL (ref 4.0–10.5)
nRBC: 0 % (ref 0.0–0.2)

## 2019-08-02 LAB — IRON AND TIBC
Iron: 52 ug/dL (ref 28–170)
Saturation Ratios: 19 % (ref 10.4–31.8)
TIBC: 272 ug/dL (ref 250–450)
UIBC: 220 ug/dL

## 2019-08-02 LAB — FERRITIN: Ferritin: 84 ng/mL (ref 11–307)

## 2019-08-02 MED ORDER — DARBEPOETIN ALFA 300 MCG/0.6ML IJ SOSY
PREFILLED_SYRINGE | INTRAMUSCULAR | Status: AC
  Filled 2019-08-02: qty 0.6

## 2019-08-02 MED ORDER — DARBEPOETIN ALFA 300 MCG/0.6ML IJ SOSY
300.0000 ug | PREFILLED_SYRINGE | Freq: Once | INTRAMUSCULAR | Status: AC
  Administered 2019-08-02: 300 ug via SUBCUTANEOUS

## 2019-08-02 NOTE — Patient Instructions (Signed)
Darbepoetin Alfa injection What is this medicine? DARBEPOETIN ALFA (dar be POE e tin AL fa) helps your body make more red blood cells. It is used to treat anemia caused by chronic kidney failure and chemotherapy. This medicine may be used for other purposes; ask your health care provider or pharmacist if you have questions. COMMON BRAND NAME(S): Aranesp What should I tell my health care provider before I take this medicine? They need to know if you have any of these conditions:  blood clotting disorders or history of blood clots  cancer patient not on chemotherapy  cystic fibrosis  heart disease, such as angina, heart failure, or a history of a heart attack  hemoglobin level of 12 g/dL or greater  high blood pressure  low levels of folate, iron, or vitamin B12  seizures  an unusual or allergic reaction to darbepoetin, erythropoietin, albumin, hamster proteins, latex, other medicines, foods, dyes, or preservatives  pregnant or trying to get pregnant  breast-feeding How should I use this medicine? This medicine is for injection into a vein or under the skin. It is usually given by a health care professional in a hospital or clinic setting. If you get this medicine at home, you will be taught how to prepare and give this medicine. Use exactly as directed. Take your medicine at regular intervals. Do not take your medicine more often than directed. It is important that you put your used needles and syringes in a special sharps container. Do not put them in a trash can. If you do not have a sharps container, call your pharmacist or healthcare provider to get one. A special MedGuide will be given to you by the pharmacist with each prescription and refill. Be sure to read this information carefully each time. Talk to your pediatrician regarding the use of this medicine in children. While this medicine may be used in children as young as 1 month of age for selected conditions, precautions do  apply. Overdosage: If you think you have taken too much of this medicine contact a poison control center or emergency room at once. NOTE: This medicine is only for you. Do not share this medicine with others. What if I miss a dose? If you miss a dose, take it as soon as you can. If it is almost time for your next dose, take only that dose. Do not take double or extra doses. What may interact with this medicine? Do not take this medicine with any of the following medications:  epoetin alfa This list may not describe all possible interactions. Give your health care provider a list of all the medicines, herbs, non-prescription drugs, or dietary supplements you use. Also tell them if you smoke, drink alcohol, or use illegal drugs. Some items may interact with your medicine. What should I watch for while using this medicine? Your condition will be monitored carefully while you are receiving this medicine. You may need blood work done while you are taking this medicine. This medicine may cause a decrease in vitamin B6. You should make sure that you get enough vitamin B6 while you are taking this medicine. Discuss the foods you eat and the vitamins you take with your health care professional. What side effects may I notice from receiving this medicine? Side effects that you should report to your doctor or health care professional as soon as possible:  allergic reactions like skin rash, itching or hives, swelling of the face, lips, or tongue  breathing problems  changes in   vision  chest pain  confusion, trouble speaking or understanding  feeling faint or lightheaded, falls  high blood pressure  muscle aches or pains  pain, swelling, warmth in the leg  rapid weight gain  severe headaches  sudden numbness or weakness of the face, arm or leg  trouble walking, dizziness, loss of balance or coordination  seizures (convulsions)  swelling of the ankles, feet, hands  unusually weak or  tired Side effects that usually do not require medical attention (report to your doctor or health care professional if they continue or are bothersome):  diarrhea  fever, chills (flu-like symptoms)  headaches  nausea, vomiting  redness, stinging, or swelling at site where injected This list may not describe all possible side effects. Call your doctor for medical advice about side effects. You may report side effects to FDA at 1-800-FDA-1088. Where should I keep my medicine? Keep out of the reach of children. Store in a refrigerator between 2 and 8 degrees C (36 and 46 degrees F). Do not freeze. Do not shake. Throw away any unused portion if using a single-dose vial. Throw away any unused medicine after the expiration date. NOTE: This sheet is a summary. It may not cover all possible information. If you have questions about this medicine, talk to your doctor, pharmacist, or health care provider.  2020 Elsevier/Gold Standard (2017-10-04 16:44:20)  

## 2019-08-16 ENCOUNTER — Ambulatory Visit: Payer: Medicare Other

## 2019-08-16 ENCOUNTER — Other Ambulatory Visit: Payer: Medicare Other

## 2019-08-23 ENCOUNTER — Inpatient Hospital Stay: Payer: Medicare Other | Attending: Oncology

## 2019-08-23 ENCOUNTER — Other Ambulatory Visit: Payer: Self-pay

## 2019-08-23 ENCOUNTER — Telehealth: Payer: Self-pay

## 2019-08-23 ENCOUNTER — Other Ambulatory Visit: Payer: Self-pay | Admitting: Oncology

## 2019-08-23 ENCOUNTER — Inpatient Hospital Stay: Payer: Medicare Other

## 2019-08-23 ENCOUNTER — Telehealth: Payer: Self-pay | Admitting: Oncology

## 2019-08-23 DIAGNOSIS — D631 Anemia in chronic kidney disease: Secondary | ICD-10-CM | POA: Insufficient documentation

## 2019-08-23 DIAGNOSIS — E611 Iron deficiency: Secondary | ICD-10-CM | POA: Diagnosis not present

## 2019-08-23 DIAGNOSIS — D509 Iron deficiency anemia, unspecified: Secondary | ICD-10-CM

## 2019-08-23 DIAGNOSIS — N189 Chronic kidney disease, unspecified: Secondary | ICD-10-CM

## 2019-08-23 DIAGNOSIS — N183 Chronic kidney disease, stage 3 unspecified: Secondary | ICD-10-CM | POA: Diagnosis present

## 2019-08-23 DIAGNOSIS — I129 Hypertensive chronic kidney disease with stage 1 through stage 4 chronic kidney disease, or unspecified chronic kidney disease: Secondary | ICD-10-CM | POA: Diagnosis present

## 2019-08-23 LAB — CBC WITH DIFFERENTIAL (CANCER CENTER ONLY)
Abs Immature Granulocytes: 0.01 10*3/uL (ref 0.00–0.07)
Basophils Absolute: 0 10*3/uL (ref 0.0–0.1)
Basophils Relative: 0 %
Eosinophils Absolute: 0 10*3/uL (ref 0.0–0.5)
Eosinophils Relative: 1 %
HCT: 34.8 % — ABNORMAL LOW (ref 36.0–46.0)
Hemoglobin: 11.9 g/dL — ABNORMAL LOW (ref 12.0–15.0)
Immature Granulocytes: 0 %
Lymphocytes Relative: 53 %
Lymphs Abs: 1.7 10*3/uL (ref 0.7–4.0)
MCH: 28.2 pg (ref 26.0–34.0)
MCHC: 34.2 g/dL (ref 30.0–36.0)
MCV: 82.5 fL (ref 80.0–100.0)
Monocytes Absolute: 0.2 10*3/uL (ref 0.1–1.0)
Monocytes Relative: 8 %
Neutro Abs: 1.2 10*3/uL — ABNORMAL LOW (ref 1.7–7.7)
Neutrophils Relative %: 38 %
Platelet Count: 112 10*3/uL — ABNORMAL LOW (ref 150–400)
RBC: 4.22 MIL/uL (ref 3.87–5.11)
RDW: 14.8 % (ref 11.5–15.5)
WBC Count: 3.1 10*3/uL — ABNORMAL LOW (ref 4.0–10.5)
nRBC: 0 % (ref 0.0–0.2)

## 2019-08-23 LAB — IRON AND TIBC
Iron: 62 ug/dL (ref 41–142)
Saturation Ratios: 26 % (ref 21–57)
TIBC: 239 ug/dL (ref 236–444)
UIBC: 177 ug/dL (ref 120–384)

## 2019-08-23 LAB — FERRITIN: Ferritin: 60 ng/mL (ref 11–307)

## 2019-08-23 NOTE — Telephone Encounter (Signed)
Received call from [facility] stating that patient does not need aranesp injection r/t HGb 11.0. This RN spoke to the patient to follow up since in 8/20 she moved to [place] to live with her son. She stated that she has moved back to [facility] to continue to receive care from Dr. Alen Blew. This RN made Dr. Alen Blew aware.

## 2019-08-23 NOTE — Progress Notes (Signed)
Patient does not need Aranesp today per lab results and treatment parameters. Seth Bake, RN for Dr. Alen Blew notified. Patient moved to [place] and all appts were cancelled per note 05/29/19. She will notify patient no injection today, provide a copy of labs and discuss further treatment needs.

## 2019-08-23 NOTE — Telephone Encounter (Signed)
Scheduled appt per 11/20 sch message - son is aware of appt . Message sent to transportation for appts.

## 2019-09-06 ENCOUNTER — Ambulatory Visit: Payer: Medicare Other | Admitting: Oncology

## 2019-09-06 ENCOUNTER — Ambulatory Visit: Payer: Medicare Other

## 2019-09-06 ENCOUNTER — Other Ambulatory Visit: Payer: Medicare Other

## 2019-09-13 ENCOUNTER — Inpatient Hospital Stay: Payer: Medicare Other

## 2019-09-13 ENCOUNTER — Other Ambulatory Visit: Payer: Self-pay

## 2019-09-13 ENCOUNTER — Inpatient Hospital Stay: Payer: Medicare Other | Attending: Oncology

## 2019-09-13 VITALS — BP 150/82 | HR 60 | Temp 98.6°F | Resp 17

## 2019-09-13 DIAGNOSIS — D631 Anemia in chronic kidney disease: Secondary | ICD-10-CM

## 2019-09-13 DIAGNOSIS — I129 Hypertensive chronic kidney disease with stage 1 through stage 4 chronic kidney disease, or unspecified chronic kidney disease: Secondary | ICD-10-CM | POA: Diagnosis present

## 2019-09-13 DIAGNOSIS — N1831 Chronic kidney disease, stage 3a: Secondary | ICD-10-CM

## 2019-09-13 DIAGNOSIS — N189 Chronic kidney disease, unspecified: Secondary | ICD-10-CM

## 2019-09-13 DIAGNOSIS — Z79899 Other long term (current) drug therapy: Secondary | ICD-10-CM | POA: Insufficient documentation

## 2019-09-13 DIAGNOSIS — D509 Iron deficiency anemia, unspecified: Secondary | ICD-10-CM

## 2019-09-13 DIAGNOSIS — N183 Chronic kidney disease, stage 3 unspecified: Secondary | ICD-10-CM | POA: Diagnosis present

## 2019-09-13 LAB — CBC WITH DIFFERENTIAL (CANCER CENTER ONLY)
Abs Immature Granulocytes: 0.01 10*3/uL (ref 0.00–0.07)
Basophils Absolute: 0 10*3/uL (ref 0.0–0.1)
Basophils Relative: 0 %
Eosinophils Absolute: 0.1 10*3/uL (ref 0.0–0.5)
Eosinophils Relative: 1 %
HCT: 29.7 % — ABNORMAL LOW (ref 36.0–46.0)
Hemoglobin: 9.7 g/dL — ABNORMAL LOW (ref 12.0–15.0)
Immature Granulocytes: 0 %
Lymphocytes Relative: 32 %
Lymphs Abs: 2 10*3/uL (ref 0.7–4.0)
MCH: 27.1 pg (ref 26.0–34.0)
MCHC: 32.7 g/dL (ref 30.0–36.0)
MCV: 83 fL (ref 80.0–100.0)
Monocytes Absolute: 0.4 10*3/uL (ref 0.1–1.0)
Monocytes Relative: 6 %
Neutro Abs: 3.8 10*3/uL (ref 1.7–7.7)
Neutrophils Relative %: 61 %
Platelet Count: 176 10*3/uL (ref 150–400)
RBC: 3.58 MIL/uL — ABNORMAL LOW (ref 3.87–5.11)
RDW: 15.4 % (ref 11.5–15.5)
WBC Count: 6.2 10*3/uL (ref 4.0–10.5)
nRBC: 0 % (ref 0.0–0.2)

## 2019-09-13 LAB — IRON AND TIBC
Iron: 68 ug/dL (ref 41–142)
Saturation Ratios: 31 % (ref 21–57)
TIBC: 219 ug/dL — ABNORMAL LOW (ref 236–444)
UIBC: 151 ug/dL (ref 120–384)

## 2019-09-13 LAB — FERRITIN: Ferritin: 206 ng/mL (ref 11–307)

## 2019-09-13 MED ORDER — DARBEPOETIN ALFA 300 MCG/0.6ML IJ SOSY
300.0000 ug | PREFILLED_SYRINGE | Freq: Once | INTRAMUSCULAR | Status: AC
  Administered 2019-09-13: 10:00:00 300 ug via SUBCUTANEOUS

## 2019-09-13 MED ORDER — DARBEPOETIN ALFA 300 MCG/0.6ML IJ SOSY
PREFILLED_SYRINGE | INTRAMUSCULAR | Status: AC
  Filled 2019-09-13: qty 0.6

## 2019-09-30 ENCOUNTER — Ambulatory Visit: Payer: Medicare Other

## 2019-09-30 ENCOUNTER — Other Ambulatory Visit: Payer: Medicare Other

## 2019-10-14 ENCOUNTER — Other Ambulatory Visit: Payer: Self-pay | Admitting: Adult Health

## 2019-10-14 DIAGNOSIS — E039 Hypothyroidism, unspecified: Secondary | ICD-10-CM

## 2019-10-18 ENCOUNTER — Other Ambulatory Visit: Payer: Medicare Other

## 2019-10-18 ENCOUNTER — Inpatient Hospital Stay: Payer: Medicare Other

## 2019-10-18 ENCOUNTER — Ambulatory Visit: Payer: Medicare Other

## 2019-10-18 ENCOUNTER — Inpatient Hospital Stay: Payer: Medicare Other | Attending: Oncology

## 2019-10-18 ENCOUNTER — Other Ambulatory Visit: Payer: Self-pay

## 2019-10-18 VITALS — BP 161/84 | HR 58 | Temp 98.5°F | Resp 18

## 2019-10-18 DIAGNOSIS — N189 Chronic kidney disease, unspecified: Secondary | ICD-10-CM

## 2019-10-18 DIAGNOSIS — N1831 Chronic kidney disease, stage 3a: Secondary | ICD-10-CM

## 2019-10-18 DIAGNOSIS — I129 Hypertensive chronic kidney disease with stage 1 through stage 4 chronic kidney disease, or unspecified chronic kidney disease: Secondary | ICD-10-CM | POA: Insufficient documentation

## 2019-10-18 DIAGNOSIS — D631 Anemia in chronic kidney disease: Secondary | ICD-10-CM | POA: Diagnosis present

## 2019-10-18 DIAGNOSIS — N183 Chronic kidney disease, stage 3 unspecified: Secondary | ICD-10-CM | POA: Diagnosis present

## 2019-10-18 DIAGNOSIS — E611 Iron deficiency: Secondary | ICD-10-CM | POA: Diagnosis not present

## 2019-10-18 DIAGNOSIS — Z79899 Other long term (current) drug therapy: Secondary | ICD-10-CM | POA: Diagnosis not present

## 2019-10-18 DIAGNOSIS — D509 Iron deficiency anemia, unspecified: Secondary | ICD-10-CM

## 2019-10-18 LAB — CBC WITH DIFFERENTIAL (CANCER CENTER ONLY)
Abs Immature Granulocytes: 0.01 10*3/uL (ref 0.00–0.07)
Basophils Absolute: 0 10*3/uL (ref 0.0–0.1)
Basophils Relative: 0 %
Eosinophils Absolute: 0 10*3/uL (ref 0.0–0.5)
Eosinophils Relative: 1 %
HCT: 27.3 % — ABNORMAL LOW (ref 36.0–46.0)
Hemoglobin: 9.1 g/dL — ABNORMAL LOW (ref 12.0–15.0)
Immature Granulocytes: 0 %
Lymphocytes Relative: 41 %
Lymphs Abs: 2.2 10*3/uL (ref 0.7–4.0)
MCH: 27.3 pg (ref 26.0–34.0)
MCHC: 33.3 g/dL (ref 30.0–36.0)
MCV: 82 fL (ref 80.0–100.0)
Monocytes Absolute: 0.3 10*3/uL (ref 0.1–1.0)
Monocytes Relative: 6 %
Neutro Abs: 2.8 10*3/uL (ref 1.7–7.7)
Neutrophils Relative %: 52 %
Platelet Count: 166 10*3/uL (ref 150–400)
RBC: 3.33 MIL/uL — ABNORMAL LOW (ref 3.87–5.11)
RDW: 16.6 % — ABNORMAL HIGH (ref 11.5–15.5)
WBC Count: 5.4 10*3/uL (ref 4.0–10.5)
nRBC: 0 % (ref 0.0–0.2)

## 2019-10-18 LAB — IRON AND TIBC
Iron: 86 ug/dL (ref 41–142)
Saturation Ratios: 38 % (ref 21–57)
TIBC: 225 ug/dL — ABNORMAL LOW (ref 236–444)
UIBC: 139 ug/dL (ref 120–384)

## 2019-10-18 LAB — FERRITIN: Ferritin: 212 ng/mL (ref 11–307)

## 2019-10-18 MED ORDER — DARBEPOETIN ALFA 300 MCG/0.6ML IJ SOSY
PREFILLED_SYRINGE | INTRAMUSCULAR | Status: AC
  Filled 2019-10-18: qty 0.6

## 2019-10-18 MED ORDER — DARBEPOETIN ALFA 300 MCG/0.6ML IJ SOSY
300.0000 ug | PREFILLED_SYRINGE | Freq: Once | INTRAMUSCULAR | Status: AC
  Administered 2019-10-18: 11:00:00 300 ug via SUBCUTANEOUS

## 2019-10-18 NOTE — Patient Instructions (Signed)
Darbepoetin Alfa injection What is this medicine? DARBEPOETIN ALFA (dar be POE e tin AL fa) helps your body make more red blood cells. It is used to treat anemia caused by chronic kidney failure and chemotherapy. This medicine may be used for other purposes; ask your health care provider or pharmacist if you have questions. COMMON BRAND NAME(S): Aranesp What should I tell my health care provider before I take this medicine? They need to know if you have any of these conditions:  blood clotting disorders or history of blood clots  cancer patient not on chemotherapy  cystic fibrosis  heart disease, such as angina, heart failure, or a history of a heart attack  hemoglobin level of 12 g/dL or greater  high blood pressure  low levels of folate, iron, or vitamin B12  seizures  an unusual or allergic reaction to darbepoetin, erythropoietin, albumin, hamster proteins, latex, other medicines, foods, dyes, or preservatives  pregnant or trying to get pregnant  breast-feeding How should I use this medicine? This medicine is for injection into a vein or under the skin. It is usually given by a health care professional in a hospital or clinic setting. If you get this medicine at home, you will be taught how to prepare and give this medicine. Use exactly as directed. Take your medicine at regular intervals. Do not take your medicine more often than directed. It is important that you put your used needles and syringes in a special sharps container. Do not put them in a trash can. If you do not have a sharps container, call your pharmacist or healthcare provider to get one. A special MedGuide will be given to you by the pharmacist with each prescription and refill. Be sure to read this information carefully each time. Talk to your pediatrician regarding the use of this medicine in children. While this medicine may be used in children as young as 1 month of age for selected conditions, precautions do  apply. Overdosage: If you think you have taken too much of this medicine contact a poison control center or emergency room at once. NOTE: This medicine is only for you. Do not share this medicine with others. What if I miss a dose? If you miss a dose, take it as soon as you can. If it is almost time for your next dose, take only that dose. Do not take double or extra doses. What may interact with this medicine? Do not take this medicine with any of the following medications:  epoetin alfa This list may not describe all possible interactions. Give your health care provider a list of all the medicines, herbs, non-prescription drugs, or dietary supplements you use. Also tell them if you smoke, drink alcohol, or use illegal drugs. Some items may interact with your medicine. What should I watch for while using this medicine? Your condition will be monitored carefully while you are receiving this medicine. You may need blood work done while you are taking this medicine. This medicine may cause a decrease in vitamin B6. You should make sure that you get enough vitamin B6 while you are taking this medicine. Discuss the foods you eat and the vitamins you take with your health care professional. What side effects may I notice from receiving this medicine? Side effects that you should report to your doctor or health care professional as soon as possible:  allergic reactions like skin rash, itching or hives, swelling of the face, lips, or tongue  breathing problems  changes in   vision  chest pain  confusion, trouble speaking or understanding  feeling faint or lightheaded, falls  high blood pressure  muscle aches or pains  pain, swelling, warmth in the leg  rapid weight gain  severe headaches  sudden numbness or weakness of the face, arm or leg  trouble walking, dizziness, loss of balance or coordination  seizures (convulsions)  swelling of the ankles, feet, hands  unusually weak or  tired Side effects that usually do not require medical attention (report to your doctor or health care professional if they continue or are bothersome):  diarrhea  fever, chills (flu-like symptoms)  headaches  nausea, vomiting  redness, stinging, or swelling at site where injected This list may not describe all possible side effects. Call your doctor for medical advice about side effects. You may report side effects to FDA at 1-800-FDA-1088. Where should I keep my medicine? Keep out of the reach of children. Store in a refrigerator between 2 and 8 degrees C (36 and 46 degrees F). Do not freeze. Do not shake. Throw away any unused portion if using a single-dose vial. Throw away any unused medicine after the expiration date. NOTE: This sheet is a summary. It may not cover all possible information. If you have questions about this medicine, talk to your doctor, pharmacist, or health care provider.  2020 Elsevier/Gold Standard (2017-10-04 16:44:20)  

## 2019-11-08 ENCOUNTER — Ambulatory Visit: Payer: Medicare Other

## 2019-11-08 ENCOUNTER — Other Ambulatory Visit: Payer: Medicare Other

## 2019-11-21 ENCOUNTER — Telehealth: Payer: Self-pay | Admitting: Oncology

## 2019-11-21 NOTE — Telephone Encounter (Signed)
I talk with patient about reschedule due to weather

## 2019-11-22 ENCOUNTER — Other Ambulatory Visit: Payer: Self-pay

## 2019-11-22 ENCOUNTER — Inpatient Hospital Stay: Payer: Medicare Other

## 2019-11-22 ENCOUNTER — Inpatient Hospital Stay: Payer: Medicare Other | Attending: Oncology

## 2019-11-22 ENCOUNTER — Other Ambulatory Visit: Payer: Medicare Other

## 2019-11-22 ENCOUNTER — Ambulatory Visit: Payer: Medicare Other

## 2019-11-22 VITALS — BP 140/73 | HR 61 | Temp 98.2°F | Resp 19

## 2019-11-22 DIAGNOSIS — D631 Anemia in chronic kidney disease: Secondary | ICD-10-CM | POA: Diagnosis present

## 2019-11-22 DIAGNOSIS — Z79899 Other long term (current) drug therapy: Secondary | ICD-10-CM | POA: Diagnosis not present

## 2019-11-22 DIAGNOSIS — D509 Iron deficiency anemia, unspecified: Secondary | ICD-10-CM

## 2019-11-22 DIAGNOSIS — I129 Hypertensive chronic kidney disease with stage 1 through stage 4 chronic kidney disease, or unspecified chronic kidney disease: Secondary | ICD-10-CM | POA: Insufficient documentation

## 2019-11-22 DIAGNOSIS — N183 Chronic kidney disease, stage 3 unspecified: Secondary | ICD-10-CM | POA: Diagnosis present

## 2019-11-22 DIAGNOSIS — N1831 Chronic kidney disease, stage 3a: Secondary | ICD-10-CM

## 2019-11-22 DIAGNOSIS — E611 Iron deficiency: Secondary | ICD-10-CM | POA: Diagnosis not present

## 2019-11-22 LAB — CBC WITH DIFFERENTIAL (CANCER CENTER ONLY)
Abs Immature Granulocytes: 0.05 10*3/uL (ref 0.00–0.07)
Basophils Absolute: 0 10*3/uL (ref 0.0–0.1)
Basophils Relative: 0 %
Eosinophils Absolute: 0.1 10*3/uL (ref 0.0–0.5)
Eosinophils Relative: 1 %
HCT: 29.4 % — ABNORMAL LOW (ref 36.0–46.0)
Hemoglobin: 9.9 g/dL — ABNORMAL LOW (ref 12.0–15.0)
Immature Granulocytes: 1 %
Lymphocytes Relative: 29 %
Lymphs Abs: 1.9 10*3/uL (ref 0.7–4.0)
MCH: 28 pg (ref 26.0–34.0)
MCHC: 33.7 g/dL (ref 30.0–36.0)
MCV: 83.1 fL (ref 80.0–100.0)
Monocytes Absolute: 0.6 10*3/uL (ref 0.1–1.0)
Monocytes Relative: 9 %
Neutro Abs: 3.9 10*3/uL (ref 1.7–7.7)
Neutrophils Relative %: 60 %
Platelet Count: 220 10*3/uL (ref 150–400)
RBC: 3.54 MIL/uL — ABNORMAL LOW (ref 3.87–5.11)
RDW: 17.4 % — ABNORMAL HIGH (ref 11.5–15.5)
WBC Count: 6.6 10*3/uL (ref 4.0–10.5)
nRBC: 0 % (ref 0.0–0.2)

## 2019-11-22 MED ORDER — DARBEPOETIN ALFA 300 MCG/0.6ML IJ SOSY
PREFILLED_SYRINGE | INTRAMUSCULAR | Status: AC
  Filled 2019-11-22: qty 0.6

## 2019-11-22 MED ORDER — DARBEPOETIN ALFA 300 MCG/0.6ML IJ SOSY
300.0000 ug | PREFILLED_SYRINGE | Freq: Once | INTRAMUSCULAR | Status: AC
  Administered 2019-11-22: 15:00:00 300 ug via SUBCUTANEOUS

## 2019-11-22 NOTE — Patient Instructions (Signed)
Darbepoetin Alfa injection What is this medicine? DARBEPOETIN ALFA (dar be POE e tin AL fa) helps your body make more red blood cells. It is used to treat anemia caused by chronic kidney failure and chemotherapy. This medicine may be used for other purposes; ask your health care provider or pharmacist if you have questions. COMMON BRAND NAME(S): Aranesp What should I tell my health care provider before I take this medicine? They need to know if you have any of these conditions:  blood clotting disorders or history of blood clots  cancer patient not on chemotherapy  cystic fibrosis  heart disease, such as angina, heart failure, or a history of a heart attack  hemoglobin level of 12 g/dL or greater  high blood pressure  low levels of folate, iron, or vitamin B12  seizures  an unusual or allergic reaction to darbepoetin, erythropoietin, albumin, hamster proteins, latex, other medicines, foods, dyes, or preservatives  pregnant or trying to get pregnant  breast-feeding How should I use this medicine? This medicine is for injection into a vein or under the skin. It is usually given by a health care professional in a hospital or clinic setting. If you get this medicine at home, you will be taught how to prepare and give this medicine. Use exactly as directed. Take your medicine at regular intervals. Do not take your medicine more often than directed. It is important that you put your used needles and syringes in a special sharps container. Do not put them in a trash can. If you do not have a sharps container, call your pharmacist or healthcare provider to get one. A special MedGuide will be given to you by the pharmacist with each prescription and refill. Be sure to read this information carefully each time. Talk to your pediatrician regarding the use of this medicine in children. While this medicine may be used in children as young as 1 month of age for selected conditions, precautions do  apply. Overdosage: If you think you have taken too much of this medicine contact a poison control center or emergency room at once. NOTE: This medicine is only for you. Do not share this medicine with others. What if I miss a dose? If you miss a dose, take it as soon as you can. If it is almost time for your next dose, take only that dose. Do not take double or extra doses. What may interact with this medicine? Do not take this medicine with any of the following medications:  epoetin alfa This list may not describe all possible interactions. Give your health care provider a list of all the medicines, herbs, non-prescription drugs, or dietary supplements you use. Also tell them if you smoke, drink alcohol, or use illegal drugs. Some items may interact with your medicine. What should I watch for while using this medicine? Your condition will be monitored carefully while you are receiving this medicine. You may need blood work done while you are taking this medicine. This medicine may cause a decrease in vitamin B6. You should make sure that you get enough vitamin B6 while you are taking this medicine. Discuss the foods you eat and the vitamins you take with your health care professional. What side effects may I notice from receiving this medicine? Side effects that you should report to your doctor or health care professional as soon as possible:  allergic reactions like skin rash, itching or hives, swelling of the face, lips, or tongue  breathing problems  changes in   vision  chest pain  confusion, trouble speaking or understanding  feeling faint or lightheaded, falls  high blood pressure  muscle aches or pains  pain, swelling, warmth in the leg  rapid weight gain  severe headaches  sudden numbness or weakness of the face, arm or leg  trouble walking, dizziness, loss of balance or coordination  seizures (convulsions)  swelling of the ankles, feet, hands  unusually weak or  tired Side effects that usually do not require medical attention (report to your doctor or health care professional if they continue or are bothersome):  diarrhea  fever, chills (flu-like symptoms)  headaches  nausea, vomiting  redness, stinging, or swelling at site where injected This list may not describe all possible side effects. Call your doctor for medical advice about side effects. You may report side effects to FDA at 1-800-FDA-1088. Where should I keep my medicine? Keep out of the reach of children. Store in a refrigerator between 2 and 8 degrees C (36 and 46 degrees F). Do not freeze. Do not shake. Throw away any unused portion if using a single-dose vial. Throw away any unused medicine after the expiration date. NOTE: This sheet is a summary. It may not cover all possible information. If you have questions about this medicine, talk to your doctor, pharmacist, or health care provider.  2020 Elsevier/Gold Standard (2017-10-04 16:44:20)  

## 2019-11-25 LAB — IRON AND TIBC
Iron: 82 ug/dL (ref 41–142)
Saturation Ratios: 35 % (ref 21–57)
TIBC: 231 ug/dL — ABNORMAL LOW (ref 236–444)
UIBC: 149 ug/dL (ref 120–384)

## 2019-11-25 LAB — FERRITIN: Ferritin: 202 ng/mL (ref 11–307)

## 2019-12-09 ENCOUNTER — Telehealth: Payer: Self-pay | Admitting: *Deleted

## 2019-12-09 ENCOUNTER — Encounter: Payer: Self-pay | Admitting: Neurology

## 2019-12-09 ENCOUNTER — Ambulatory Visit: Payer: Medicare Other | Admitting: Neurology

## 2019-12-09 ENCOUNTER — Encounter: Payer: Self-pay | Admitting: *Deleted

## 2019-12-09 NOTE — Telephone Encounter (Signed)
No showed new patient appointment. 

## 2019-12-14 ENCOUNTER — Other Ambulatory Visit: Payer: Self-pay | Admitting: Nurse Practitioner

## 2019-12-20 ENCOUNTER — Inpatient Hospital Stay: Payer: Medicare Other | Attending: Oncology

## 2019-12-20 ENCOUNTER — Inpatient Hospital Stay: Payer: Medicare Other

## 2020-01-16 ENCOUNTER — Other Ambulatory Visit: Payer: Self-pay

## 2020-01-16 DIAGNOSIS — D631 Anemia in chronic kidney disease: Secondary | ICD-10-CM

## 2020-01-17 ENCOUNTER — Other Ambulatory Visit: Payer: Self-pay

## 2020-01-17 ENCOUNTER — Inpatient Hospital Stay: Payer: Medicare Other | Attending: Oncology

## 2020-01-17 ENCOUNTER — Inpatient Hospital Stay: Payer: Medicare Other

## 2020-01-17 VITALS — BP 153/75 | HR 74 | Temp 98.1°F | Resp 18

## 2020-01-17 DIAGNOSIS — I129 Hypertensive chronic kidney disease with stage 1 through stage 4 chronic kidney disease, or unspecified chronic kidney disease: Secondary | ICD-10-CM | POA: Diagnosis present

## 2020-01-17 DIAGNOSIS — D631 Anemia in chronic kidney disease: Secondary | ICD-10-CM

## 2020-01-17 DIAGNOSIS — E611 Iron deficiency: Secondary | ICD-10-CM | POA: Diagnosis not present

## 2020-01-17 DIAGNOSIS — N183 Chronic kidney disease, stage 3 unspecified: Secondary | ICD-10-CM | POA: Insufficient documentation

## 2020-01-17 DIAGNOSIS — Z79899 Other long term (current) drug therapy: Secondary | ICD-10-CM | POA: Diagnosis not present

## 2020-01-17 DIAGNOSIS — N1831 Chronic kidney disease, stage 3a: Secondary | ICD-10-CM

## 2020-01-17 DIAGNOSIS — N189 Chronic kidney disease, unspecified: Secondary | ICD-10-CM

## 2020-01-17 DIAGNOSIS — D509 Iron deficiency anemia, unspecified: Secondary | ICD-10-CM

## 2020-01-17 LAB — CBC WITH DIFFERENTIAL (CANCER CENTER ONLY)
Abs Immature Granulocytes: 0.01 10*3/uL (ref 0.00–0.07)
Basophils Absolute: 0 10*3/uL (ref 0.0–0.1)
Basophils Relative: 0 %
Eosinophils Absolute: 0.1 10*3/uL (ref 0.0–0.5)
Eosinophils Relative: 1 %
HCT: 24.8 % — ABNORMAL LOW (ref 36.0–46.0)
Hemoglobin: 8.2 g/dL — ABNORMAL LOW (ref 12.0–15.0)
Immature Granulocytes: 0 %
Lymphocytes Relative: 46 %
Lymphs Abs: 1.8 10*3/uL (ref 0.7–4.0)
MCH: 27.2 pg (ref 26.0–34.0)
MCHC: 33.1 g/dL (ref 30.0–36.0)
MCV: 82.1 fL (ref 80.0–100.0)
Monocytes Absolute: 0.4 10*3/uL (ref 0.1–1.0)
Monocytes Relative: 10 %
Neutro Abs: 1.7 10*3/uL (ref 1.7–7.7)
Neutrophils Relative %: 43 %
Platelet Count: 124 10*3/uL — ABNORMAL LOW (ref 150–400)
RBC: 3.02 MIL/uL — ABNORMAL LOW (ref 3.87–5.11)
RDW: 15.8 % — ABNORMAL HIGH (ref 11.5–15.5)
WBC Count: 4 10*3/uL (ref 4.0–10.5)
nRBC: 0 % (ref 0.0–0.2)

## 2020-01-17 LAB — IRON AND TIBC
Iron: 77 ug/dL (ref 41–142)
Saturation Ratios: 38 % (ref 21–57)
TIBC: 200 ug/dL — ABNORMAL LOW (ref 236–444)
UIBC: 124 ug/dL (ref 120–384)

## 2020-01-17 LAB — FERRITIN: Ferritin: 289 ng/mL (ref 11–307)

## 2020-01-17 MED ORDER — DARBEPOETIN ALFA 300 MCG/0.6ML IJ SOSY
300.0000 ug | PREFILLED_SYRINGE | Freq: Once | INTRAMUSCULAR | Status: AC
  Administered 2020-01-17: 300 ug via SUBCUTANEOUS

## 2020-02-14 ENCOUNTER — Inpatient Hospital Stay: Payer: Medicare Other | Attending: Oncology

## 2020-02-14 ENCOUNTER — Inpatient Hospital Stay: Payer: Medicare Other | Admitting: Oncology

## 2020-02-14 ENCOUNTER — Inpatient Hospital Stay: Payer: Medicare Other

## 2020-02-14 DIAGNOSIS — I129 Hypertensive chronic kidney disease with stage 1 through stage 4 chronic kidney disease, or unspecified chronic kidney disease: Secondary | ICD-10-CM | POA: Insufficient documentation

## 2020-02-14 DIAGNOSIS — Z79899 Other long term (current) drug therapy: Secondary | ICD-10-CM | POA: Insufficient documentation

## 2020-02-14 DIAGNOSIS — R634 Abnormal weight loss: Secondary | ICD-10-CM | POA: Insufficient documentation

## 2020-02-14 DIAGNOSIS — N183 Chronic kidney disease, stage 3 unspecified: Secondary | ICD-10-CM | POA: Insufficient documentation

## 2020-02-14 DIAGNOSIS — D631 Anemia in chronic kidney disease: Secondary | ICD-10-CM | POA: Insufficient documentation

## 2020-02-21 ENCOUNTER — Inpatient Hospital Stay: Payer: Medicare Other

## 2020-02-21 ENCOUNTER — Other Ambulatory Visit: Payer: Self-pay

## 2020-02-21 ENCOUNTER — Inpatient Hospital Stay (HOSPITAL_BASED_OUTPATIENT_CLINIC_OR_DEPARTMENT_OTHER): Payer: Medicare Other | Admitting: Oncology

## 2020-02-21 VITALS — BP 122/65 | HR 65 | Temp 97.9°F | Resp 18 | Ht 62.5 in | Wt 126.1 lb

## 2020-02-21 DIAGNOSIS — N183 Chronic kidney disease, stage 3 unspecified: Secondary | ICD-10-CM | POA: Diagnosis present

## 2020-02-21 DIAGNOSIS — I129 Hypertensive chronic kidney disease with stage 1 through stage 4 chronic kidney disease, or unspecified chronic kidney disease: Secondary | ICD-10-CM | POA: Diagnosis not present

## 2020-02-21 DIAGNOSIS — D631 Anemia in chronic kidney disease: Secondary | ICD-10-CM | POA: Diagnosis not present

## 2020-02-21 DIAGNOSIS — N1831 Chronic kidney disease, stage 3a: Secondary | ICD-10-CM

## 2020-02-21 DIAGNOSIS — N189 Chronic kidney disease, unspecified: Secondary | ICD-10-CM

## 2020-02-21 DIAGNOSIS — Z79899 Other long term (current) drug therapy: Secondary | ICD-10-CM | POA: Diagnosis not present

## 2020-02-21 DIAGNOSIS — R634 Abnormal weight loss: Secondary | ICD-10-CM | POA: Diagnosis not present

## 2020-02-21 LAB — CMP (CANCER CENTER ONLY)
ALT: 10 U/L (ref 0–44)
AST: 14 U/L — ABNORMAL LOW (ref 15–41)
Albumin: 3.8 g/dL (ref 3.5–5.0)
Alkaline Phosphatase: 96 U/L (ref 38–126)
Anion gap: 10 (ref 5–15)
BUN: 30 mg/dL — ABNORMAL HIGH (ref 8–23)
CO2: 25 mmol/L (ref 22–32)
Calcium: 9.7 mg/dL (ref 8.9–10.3)
Chloride: 110 mmol/L (ref 98–111)
Creatinine: 1.88 mg/dL — ABNORMAL HIGH (ref 0.44–1.00)
GFR, Est AFR Am: 29 mL/min — ABNORMAL LOW (ref >60)
GFR, Estimated: 25 mL/min — ABNORMAL LOW (ref >60)
Glucose, Bld: 93 mg/dL (ref 70–99)
Potassium: 3.6 mmol/L (ref 3.5–5.1)
Sodium: 145 mmol/L (ref 135–145)
Total Bilirubin: 0.4 mg/dL (ref 0.3–1.2)
Total Protein: 7.1 g/dL (ref 6.5–8.1)

## 2020-02-21 LAB — IRON AND TIBC
Iron: 56 ug/dL (ref 41–142)
Saturation Ratios: 28 % (ref 21–57)
TIBC: 196 ug/dL — ABNORMAL LOW (ref 236–444)
UIBC: 140 ug/dL (ref 120–384)

## 2020-02-21 LAB — CBC WITH DIFFERENTIAL (CANCER CENTER ONLY)
Abs Immature Granulocytes: 0.01 10*3/uL (ref 0.00–0.07)
Basophils Absolute: 0 10*3/uL (ref 0.0–0.1)
Basophils Relative: 0 %
Eosinophils Absolute: 0.1 10*3/uL (ref 0.0–0.5)
Eosinophils Relative: 1 %
HCT: 25.5 % — ABNORMAL LOW (ref 36.0–46.0)
Hemoglobin: 8.5 g/dL — ABNORMAL LOW (ref 12.0–15.0)
Immature Granulocytes: 0 %
Lymphocytes Relative: 45 %
Lymphs Abs: 2.2 10*3/uL (ref 0.7–4.0)
MCH: 26.9 pg (ref 26.0–34.0)
MCHC: 33.3 g/dL (ref 30.0–36.0)
MCV: 80.7 fL (ref 80.0–100.0)
Monocytes Absolute: 0.4 10*3/uL (ref 0.1–1.0)
Monocytes Relative: 9 %
Neutro Abs: 2.2 10*3/uL (ref 1.7–7.7)
Neutrophils Relative %: 45 %
Platelet Count: 163 10*3/uL (ref 150–400)
RBC: 3.16 MIL/uL — ABNORMAL LOW (ref 3.87–5.11)
RDW: 15.3 % (ref 11.5–15.5)
WBC Count: 4.9 10*3/uL (ref 4.0–10.5)
nRBC: 0 % (ref 0.0–0.2)

## 2020-02-21 LAB — FERRITIN: Ferritin: 254 ng/mL (ref 11–307)

## 2020-02-21 MED ORDER — DARBEPOETIN ALFA 300 MCG/0.6ML IJ SOSY
300.0000 ug | PREFILLED_SYRINGE | Freq: Once | INTRAMUSCULAR | Status: AC
  Administered 2020-02-21: 300 ug via SUBCUTANEOUS

## 2020-02-21 MED ORDER — DARBEPOETIN ALFA 300 MCG/0.6ML IJ SOSY
PREFILLED_SYRINGE | INTRAMUSCULAR | Status: AC
  Filled 2020-02-21: qty 0.6

## 2020-02-21 NOTE — Patient Instructions (Signed)
Darbepoetin Alfa injection What is this medicine? DARBEPOETIN ALFA (dar be POE e tin AL fa) helps your body make more red blood cells. It is used to treat anemia caused by chronic kidney failure and chemotherapy. This medicine may be used for other purposes; ask your health care provider or pharmacist if you have questions. COMMON BRAND NAME(S): Aranesp What should I tell my health care provider before I take this medicine? They need to know if you have any of these conditions:  blood clotting disorders or history of blood clots  cancer patient not on chemotherapy  cystic fibrosis  heart disease, such as angina, heart failure, or a history of a heart attack  hemoglobin level of 12 g/dL or greater  high blood pressure  low levels of folate, iron, or vitamin B12  seizures  an unusual or allergic reaction to darbepoetin, erythropoietin, albumin, hamster proteins, latex, other medicines, foods, dyes, or preservatives  pregnant or trying to get pregnant  breast-feeding How should I use this medicine? This medicine is for injection into a vein or under the skin. It is usually given by a health care professional in a hospital or clinic setting. If you get this medicine at home, you will be taught how to prepare and give this medicine. Use exactly as directed. Take your medicine at regular intervals. Do not take your medicine more often than directed. It is important that you put your used needles and syringes in a special sharps container. Do not put them in a trash can. If you do not have a sharps container, call your pharmacist or healthcare provider to get one. A special MedGuide will be given to you by the pharmacist with each prescription and refill. Be sure to read this information carefully each time. Talk to your pediatrician regarding the use of this medicine in children. While this medicine may be used in children as young as 1 month of age for selected conditions, precautions do  apply. Overdosage: If you think you have taken too much of this medicine contact a poison control center or emergency room at once. NOTE: This medicine is only for you. Do not share this medicine with others. What if I miss a dose? If you miss a dose, take it as soon as you can. If it is almost time for your next dose, take only that dose. Do not take double or extra doses. What may interact with this medicine? Do not take this medicine with any of the following medications:  epoetin alfa This list may not describe all possible interactions. Give your health care provider a list of all the medicines, herbs, non-prescription drugs, or dietary supplements you use. Also tell them if you smoke, drink alcohol, or use illegal drugs. Some items may interact with your medicine. What should I watch for while using this medicine? Your condition will be monitored carefully while you are receiving this medicine. You may need blood work done while you are taking this medicine. This medicine may cause a decrease in vitamin B6. You should make sure that you get enough vitamin B6 while you are taking this medicine. Discuss the foods you eat and the vitamins you take with your health care professional. What side effects may I notice from receiving this medicine? Side effects that you should report to your doctor or health care professional as soon as possible:  allergic reactions like skin rash, itching or hives, swelling of the face, lips, or tongue  breathing problems  changes in   vision  chest pain  confusion, trouble speaking or understanding  feeling faint or lightheaded, falls  high blood pressure  muscle aches or pains  pain, swelling, warmth in the leg  rapid weight gain  severe headaches  sudden numbness or weakness of the face, arm or leg  trouble walking, dizziness, loss of balance or coordination  seizures (convulsions)  swelling of the ankles, feet, hands  unusually weak or  tired Side effects that usually do not require medical attention (report to your doctor or health care professional if they continue or are bothersome):  diarrhea  fever, chills (flu-like symptoms)  headaches  nausea, vomiting  redness, stinging, or swelling at site where injected This list may not describe all possible side effects. Call your doctor for medical advice about side effects. You may report side effects to FDA at 1-800-FDA-1088. Where should I keep my medicine? Keep out of the reach of children. Store in a refrigerator between 2 and 8 degrees C (36 and 46 degrees F). Do not freeze. Do not shake. Throw away any unused portion if using a single-dose vial. Throw away any unused medicine after the expiration date. NOTE: This sheet is a summary. It may not cover all possible information. If you have questions about this medicine, talk to your doctor, pharmacist, or health care provider.  2020 Elsevier/Gold Standard (2017-10-04 16:44:20)  

## 2020-02-21 NOTE — Progress Notes (Signed)
Hematology and Oncology Follow Up Visit  Susan [name] [id number] 12-Sep-1941 79 y.o. 02/21/2020 8:47 AM Patient, No Pcp Leda Min Mathis Dad, MD   Principle Diagnosis:  50 year old woman with anemia of renal disease diagnosed in 2010.  She has element of iron deficiency as well.   Current therapy:  Aranesp 300 mcg every 4 weeks to keep her hemoglobin above 11.   Interim History:  Susan [name] returns today for a repeat evaluation.  Since the last visit, she reports no major changes in her health.  She relocated briefly to [place] to live with her son but opted to return and currently lives independently.  She does have a caregiver that assists her in some activities of daily living.  She is ambulating with the help of cane without any falls or syncope. Appetite has been fluctuating of lost weight overall.  He denies any chest pain or shortness of breath or difficulty breathing.  She denies any dizziness or lightheadedness.       Medications: Reviewed without changes. Current Outpatient Medications  Medication Sig Dispense Refill  . acetaminophen (TYLENOL) 500 MG tablet Take 500-1,000 mg by mouth every 6 (six) hours as needed (for pain.).    [name] amLODipine (NORVASC) 10 MG tablet TAKE 1/2 TABLET BY MOUTH DAILY AT BEDTIME; (TAKE 1 TABLET IF SBP IS >150) (Patient taking differently: Take 10 mg by mouth at bedtime. (TAKE 1 TABLET IF SBP IS >150)) 90 tablet 0  . aspirin 81 MG chewable tablet Chew 81 mg by mouth daily.     . Cholecalciferol (VITAMIN D-3) 5000 UNITS TABS Take 15,000 Units by mouth daily.     . Darbepoetin Alfa (ARANESP, ALBUMIN FREE,) 300 MCG/ML SOLN Inject 300 mcg into the skin See admin instructions. USE INJECTION EVERY 3 WEEKS AS NEEDED FOR HEMOGLOBIN LESS THAN 11.    . diclofenac (VOLTAREN) 75 MG EC tablet TAKE 1 TABLET(75 MG) BY MOUTH TWICE DAILY 180 tablet 0  . diphenhydrAMINE (BENADRYL) 25 mg capsule One capsule every 6 hours x 3 days, then as needed for symptoms of rash/itching  30 capsule 0  . HYDROcodone-acetaminophen (NORCO/VICODIN) 5-325 MG tablet Take 1 tablet by mouth every 6 (six) hours as needed for moderate pain. 20 tablet 0  . hydroxypropyl methylcellulose / hypromellose (ISOPTO TEARS / GONIOVISC) 2.5 % ophthalmic solution Place 1-2 drops into both eyes 3 (three) times daily as needed for dry eyes.    [name] labetalol (NORMODYNE) 300 MG tablet TAKE 1 TABLET BY MOUTH TWICE DAILY 180 tablet 1  . levothyroxine (SYNTHROID) 100 MCG tablet Take 1 tablet daily on an empty stomach with only water for 30 minutes & no Antacid meds, Calcium or Magnesium for 4 hours & avoid Biotin 90 tablet 3  . Magnesium 500 MG TABS Take 500 mg by mouth every evening.     [name] OMEPRAZOLE PO Take 1 tablet by mouth as needed.     . pantoprazole (PROTONIX) 40 MG tablet TK 1 T PO QD FOR ACID REFLUX  0   No current facility-administered medications for this visit.       Physical Exam:  Blood pressure 122/65, pulse 65, temperature 97.9 F (36.6 C), temperature source Temporal, resp. rate 18, height 5' 2.5" (1.588 m), weight 126 lb 1.6 oz (57.2 kg), SpO2 100 %.   ECOG: 1     General appearance: Comfortable appearing without any discomfort Head: Normocephalic without any trauma Oropharynx: Mucous membranes are moist and pink without any thrush or ulcers. Eyes: Pupils are  equal and round reactive to light. Lymph nodes: No cervical, supraclavicular, inguinal or axillary lymphadenopathy.   Heart:regular rate and rhythm.  S1 and S2 without leg edema. Lung: Clear without any rhonchi or wheezes.  No dullness to percussion. Abdomin: Soft, nontender, nondistended with good bowel sounds.  No hepatosplenomegaly. Musculoskeletal: No joint deformity or effusion.  Full range of motion noted. Neurological: No deficits noted on motor, sensory and deep tendon reflex exam. Skin: No petechial rash or dryness.  Appeared moist.        Lab Results: Lab Results  Component Value Date   WBC 4.0  01/17/2020   HGB 8.2 (L) 01/17/2020   HCT 24.8 (L) 01/17/2020   MCV 82.1 01/17/2020   PLT 124 (L) 01/17/2020     Chemistry      Component Value Date/Time   NA 141 04/12/2019 0804   NA 143 06/30/2017 0846   K 3.6 04/12/2019 0804   K 4.4 06/30/2017 0846   CL 107 04/12/2019 0804   CO2 24 04/12/2019 0804   CO2 24 06/30/2017 0846   BUN 18 04/12/2019 0804   BUN 26.5 (H) 06/30/2017 0846   CREATININE 1.26 (H) 04/12/2019 0804   CREATININE 1.31 (H) 07/02/2018 0950   CREATININE 1.4 (H) 06/30/2017 0846      Component Value Date/Time   CALCIUM 9.5 04/12/2019 0804   CALCIUM 10.1 06/30/2017 0846   ALKPHOS 84 04/12/2019 0804   ALKPHOS 101 06/30/2017 0846   AST 20 04/12/2019 0804   AST 17 06/30/2017 0846   ALT 8 04/12/2019 0804   ALT <6 06/30/2017 0846   BILITOT 0.8 04/12/2019 0804   BILITOT 0.76 06/30/2017 0846      Impression and Plan:   79 year old woman with:   1.  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  She has element of iron deficiency as well.    She has not been receiving Aranesp every 4 weeks to keep her hemoglobin above 11.  Risks and benefits of continuing this treatment were reviewed today.  Potentially changing her frequency to every 3 weeks would be considered given her recent drop in her hemoglobin.  Additional etiology for her anemia is considered at this time including myelodysplastic syndrome and a repeat bone marrow biopsy may be needed if she develops other cytopenias.  Iron studies obtained on January 17, 2020 showed adequate iron levels for the time being and does not require any additional intravenous iron.  We will continue to monitor this closely.  Her hemoglobin today is improving and does not require any transfusion but will receive Aranesp.  2.  Hypertension: Her blood pressure is within normal range at this time.  3.  Weight loss: Prescription for Megace will be available to her to boost her appetite.  Complication associated with  medication including venous thromboembolism and adrenal insufficiency were reviewed.  4.  Follow-up: She will continue to follow monthly for injection and MD follow-up in 6 months.  30  minutes were dedicated to this visit. The time was spent on reviewing laboratory data, discussing treatment options, discussing differential diagnosis and answering questions regarding future plan.         Zola Button MD 5/21/20218:47 AM

## 2020-02-26 ENCOUNTER — Telehealth: Payer: Self-pay | Admitting: Oncology

## 2020-02-26 NOTE — Telephone Encounter (Signed)
Scheduled per 05/21 los, spoke with patient's son and patient will be notified of upcoming appointments.

## 2020-02-27 ENCOUNTER — Ambulatory Visit (INDEPENDENT_AMBULATORY_CARE_PROVIDER_SITE_OTHER): Payer: Medicare Other | Admitting: Neurology

## 2020-02-27 ENCOUNTER — Telehealth: Payer: Self-pay | Admitting: Neurology

## 2020-02-27 ENCOUNTER — Encounter: Payer: Self-pay | Admitting: Neurology

## 2020-02-27 ENCOUNTER — Other Ambulatory Visit: Payer: Self-pay

## 2020-02-27 VITALS — BP 133/78 | HR 73 | Ht 62.5 in | Wt 124.5 lb

## 2020-02-27 DIAGNOSIS — F0390 Unspecified dementia without behavioral disturbance: Secondary | ICD-10-CM | POA: Diagnosis not present

## 2020-02-27 MED ORDER — MEMANTINE HCL 10 MG PO TABS: 10.0000 mg | ORAL_TABLET | Freq: Two times a day (BID) | ORAL | 11 refills | Status: DC

## 2020-02-27 NOTE — Telephone Encounter (Signed)
[employer] medicare order sent to [facility]. No auth they will reach out to the patient to schedule.  
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
## 2020-02-28 LAB — RPR: RPR Ser Ql: NONREACTIVE

## 2020-02-28 LAB — THYROID PANEL WITH TSH
Free Thyroxine Index: 3.2 (ref 1.2–4.9)
T3 Uptake Ratio: 38 % (ref 24–39)
T4, Total: 8.4 ug/dL (ref 4.5–12.0)
TSH: 0.009 u[IU]/mL — ABNORMAL LOW (ref 0.450–4.500)

## 2020-02-28 LAB — VITAMIN B12: Vitamin B-12: 602 pg/mL (ref 232–1245)

## 2020-03-03 ENCOUNTER — Telehealth: Payer: Self-pay | Admitting: Neurology

## 2020-03-03 NOTE — Telephone Encounter (Signed)
I have spoken with the patient. She verbalized understanding of her lab results and will contact Dr. Tamala Julian to have him to address the abnormal lab findings.

## 2020-03-03 NOTE — Telephone Encounter (Signed)
Please call patient, repeat thyroid function on Feb 27, 2020 showed significantly decreased TSH 0.009, with normal total T4, free thyroxine index  She is taking thyroid supplement,  I have forward the lab result to her primary care Dustin Folks, she should contact him for further evaluation, rest of the laboratory evaluation showed no significant abnormalities.

## 2020-03-20 ENCOUNTER — Other Ambulatory Visit: Payer: Self-pay

## 2020-03-20 ENCOUNTER — Inpatient Hospital Stay: Payer: Medicare Other

## 2020-03-20 ENCOUNTER — Inpatient Hospital Stay: Payer: Medicare Other | Attending: Oncology

## 2020-03-20 ENCOUNTER — Telehealth: Payer: Self-pay

## 2020-03-20 VITALS — BP 146/75 | HR 82 | Temp 98.1°F | Resp 18

## 2020-03-20 DIAGNOSIS — I129 Hypertensive chronic kidney disease with stage 1 through stage 4 chronic kidney disease, or unspecified chronic kidney disease: Secondary | ICD-10-CM | POA: Insufficient documentation

## 2020-03-20 DIAGNOSIS — N1831 Chronic kidney disease, stage 3a: Secondary | ICD-10-CM

## 2020-03-20 DIAGNOSIS — N183 Chronic kidney disease, stage 3 unspecified: Secondary | ICD-10-CM | POA: Insufficient documentation

## 2020-03-20 DIAGNOSIS — R634 Abnormal weight loss: Secondary | ICD-10-CM | POA: Insufficient documentation

## 2020-03-20 DIAGNOSIS — D631 Anemia in chronic kidney disease: Secondary | ICD-10-CM | POA: Insufficient documentation

## 2020-03-20 DIAGNOSIS — D509 Iron deficiency anemia, unspecified: Secondary | ICD-10-CM

## 2020-03-20 LAB — CBC WITH DIFFERENTIAL (CANCER CENTER ONLY)
Abs Immature Granulocytes: 0.02 10*3/uL (ref 0.00–0.07)
Basophils Absolute: 0 10*3/uL (ref 0.0–0.1)
Basophils Relative: 0 %
Eosinophils Absolute: 0.2 10*3/uL (ref 0.0–0.5)
Eosinophils Relative: 4 %
HCT: 27.4 % — ABNORMAL LOW (ref 36.0–46.0)
Hemoglobin: 9.1 g/dL — ABNORMAL LOW (ref 12.0–15.0)
Immature Granulocytes: 0 %
Lymphocytes Relative: 44 %
Lymphs Abs: 2.2 10*3/uL (ref 0.7–4.0)
MCH: 26.6 pg (ref 26.0–34.0)
MCHC: 33.2 g/dL (ref 30.0–36.0)
MCV: 80.1 fL (ref 80.0–100.0)
Monocytes Absolute: 0.5 10*3/uL (ref 0.1–1.0)
Monocytes Relative: 10 %
Neutro Abs: 2.1 10*3/uL (ref 1.7–7.7)
Neutrophils Relative %: 42 %
Platelet Count: 130 10*3/uL — ABNORMAL LOW (ref 150–400)
RBC: 3.42 MIL/uL — ABNORMAL LOW (ref 3.87–5.11)
RDW: 15 % (ref 11.5–15.5)
WBC Count: 5 10*3/uL (ref 4.0–10.5)
nRBC: 0 % (ref 0.0–0.2)

## 2020-03-20 LAB — IRON AND TIBC
Iron: 71 ug/dL (ref 41–142)
Saturation Ratios: 33 % (ref 21–57)
TIBC: 216 ug/dL — ABNORMAL LOW (ref 236–444)
UIBC: 145 ug/dL (ref 120–384)

## 2020-03-20 LAB — CMP (CANCER CENTER ONLY)
ALT: 11 U/L (ref 0–44)
AST: 18 U/L (ref 15–41)
Albumin: 3.9 g/dL (ref 3.5–5.0)
Alkaline Phosphatase: 114 U/L (ref 38–126)
Anion gap: 9 (ref 5–15)
BUN: 27 mg/dL — ABNORMAL HIGH (ref 8–23)
CO2: 24 mmol/L (ref 22–32)
Calcium: 10.4 mg/dL — ABNORMAL HIGH (ref 8.9–10.3)
Chloride: 110 mmol/L (ref 98–111)
Creatinine: 1.31 mg/dL — ABNORMAL HIGH (ref 0.44–1.00)
GFR, Est AFR Am: 44 mL/min — ABNORMAL LOW (ref >60)
GFR, Estimated: 38 mL/min — ABNORMAL LOW (ref >60)
Glucose, Bld: 73 mg/dL (ref 70–99)
Potassium: 4 mmol/L (ref 3.5–5.1)
Sodium: 143 mmol/L (ref 135–145)
Total Bilirubin: 0.5 mg/dL (ref 0.3–1.2)
Total Protein: 7.8 g/dL (ref 6.5–8.1)

## 2020-03-20 LAB — SAMPLE TO BLOOD BANK

## 2020-03-20 LAB — FERRITIN: Ferritin: 244 ng/mL (ref 11–307)

## 2020-03-20 MED ORDER — DARBEPOETIN ALFA 300 MCG/0.6ML IJ SOSY
300.0000 ug | PREFILLED_SYRINGE | Freq: Once | INTRAMUSCULAR | Status: AC
  Administered 2020-03-20: 300 ug via SUBCUTANEOUS

## 2020-03-20 NOTE — Telephone Encounter (Addendum)
Hold Clot HGB 9.1 confirmed by Ulice Dash in [facility] lab

## 2020-04-08 ENCOUNTER — Other Ambulatory Visit: Payer: Self-pay

## 2020-04-08 ENCOUNTER — Ambulatory Visit (INDEPENDENT_AMBULATORY_CARE_PROVIDER_SITE_OTHER): Payer: Medicare Other | Admitting: Podiatry

## 2020-04-08 ENCOUNTER — Encounter: Payer: Self-pay | Admitting: Podiatry

## 2020-04-08 DIAGNOSIS — M79674 Pain in right toe(s): Secondary | ICD-10-CM

## 2020-04-08 DIAGNOSIS — Q828 Other specified congenital malformations of skin: Secondary | ICD-10-CM | POA: Diagnosis not present

## 2020-04-08 DIAGNOSIS — M79675 Pain in left toe(s): Secondary | ICD-10-CM | POA: Diagnosis not present

## 2020-04-08 DIAGNOSIS — B351 Tinea unguium: Secondary | ICD-10-CM | POA: Diagnosis not present

## 2020-04-08 DIAGNOSIS — N1831 Chronic kidney disease, stage 3a: Secondary | ICD-10-CM

## 2020-04-08 NOTE — Progress Notes (Signed)
This patient returns to my office for at risk foot care.  This patient requires this care by a professional since this patient will be at risk due to having chronic kidney disease.   This patient is unable to cut nails herself since the patient cannot reach her nails.These nails are painful walking and wearing shoes.  This patient presents for at risk foot care today.  General Appearance  Alert, conversant and in no acute stress.  Vascular  Dorsalis pedis and posterior tibial  pulses are palpable  bilaterally.  Capillary return is within normal limits  bilaterally. Temperature is within normal limits  bilaterally.  Neurologic  Senn-Weinstein monofilament wire test within normal limits  bilaterally. Muscle power within normal limits bilaterally.  Nails Thick disfigured discolored nails with subungual debris  from hallux to fifth toes bilaterally. No evidence of bacterial infection or drainage bilaterally.  Orthopedic  No limitations of motion  feet .  No crepitus or effusions noted.  HAV with contracted digits  B/L.  Skin  normotropic skin noted bilaterally.  No signs of infections or ulcers noted.   Porokeratosis sub 3 left forefoot.  Onychomycosis  Pain in right toes  Pain in left toes  Consent was obtained for treatment procedures.   Mechanical debridement of nails 1-5  bilaterally performed with a nail nipper.  Filed with dremel without incident. Debridement of porokeratosis with #15 blade.  Return office visit   prn                  Told patient to return for periodic foot care and evaluation due to potential at risk complications.   Gardiner Barefoot DPM

## 2020-04-17 ENCOUNTER — Inpatient Hospital Stay: Payer: Medicare Other | Attending: Oncology

## 2020-04-17 ENCOUNTER — Other Ambulatory Visit: Payer: Self-pay

## 2020-04-17 ENCOUNTER — Inpatient Hospital Stay (HOSPITAL_BASED_OUTPATIENT_CLINIC_OR_DEPARTMENT_OTHER): Payer: Medicare Other | Admitting: Oncology

## 2020-04-17 ENCOUNTER — Inpatient Hospital Stay: Payer: Medicare Other

## 2020-04-17 VITALS — BP 107/64 | HR 69 | Temp 97.7°F | Resp 18 | Ht 62.5 in | Wt 127.3 lb

## 2020-04-17 DIAGNOSIS — D631 Anemia in chronic kidney disease: Secondary | ICD-10-CM | POA: Insufficient documentation

## 2020-04-17 DIAGNOSIS — E611 Iron deficiency: Secondary | ICD-10-CM | POA: Diagnosis not present

## 2020-04-17 DIAGNOSIS — Z79899 Other long term (current) drug therapy: Secondary | ICD-10-CM | POA: Insufficient documentation

## 2020-04-17 DIAGNOSIS — N183 Chronic kidney disease, stage 3 unspecified: Secondary | ICD-10-CM | POA: Diagnosis present

## 2020-04-17 DIAGNOSIS — N1831 Chronic kidney disease, stage 3a: Secondary | ICD-10-CM

## 2020-04-17 DIAGNOSIS — R634 Abnormal weight loss: Secondary | ICD-10-CM | POA: Diagnosis not present

## 2020-04-17 DIAGNOSIS — R0981 Nasal congestion: Secondary | ICD-10-CM | POA: Diagnosis not present

## 2020-04-17 DIAGNOSIS — I129 Hypertensive chronic kidney disease with stage 1 through stage 4 chronic kidney disease, or unspecified chronic kidney disease: Secondary | ICD-10-CM | POA: Insufficient documentation

## 2020-04-17 DIAGNOSIS — D509 Iron deficiency anemia, unspecified: Secondary | ICD-10-CM | POA: Diagnosis not present

## 2020-04-17 LAB — CBC WITH DIFFERENTIAL (CANCER CENTER ONLY)
Abs Immature Granulocytes: 0.01 10*3/uL (ref 0.00–0.07)
Basophils Absolute: 0 10*3/uL (ref 0.0–0.1)
Basophils Relative: 0 %
Eosinophils Absolute: 0.1 10*3/uL (ref 0.0–0.5)
Eosinophils Relative: 3 %
HCT: 28 % — ABNORMAL LOW (ref 36.0–46.0)
Hemoglobin: 9.2 g/dL — ABNORMAL LOW (ref 12.0–15.0)
Immature Granulocytes: 0 %
Lymphocytes Relative: 34 %
Lymphs Abs: 1.6 10*3/uL (ref 0.7–4.0)
MCH: 26.7 pg (ref 26.0–34.0)
MCHC: 32.9 g/dL (ref 30.0–36.0)
MCV: 81.2 fL (ref 80.0–100.0)
Monocytes Absolute: 0.5 10*3/uL (ref 0.1–1.0)
Monocytes Relative: 11 %
Neutro Abs: 2.5 10*3/uL (ref 1.7–7.7)
Neutrophils Relative %: 52 %
Platelet Count: 96 10*3/uL — ABNORMAL LOW (ref 150–400)
RBC: 3.45 MIL/uL — ABNORMAL LOW (ref 3.87–5.11)
RDW: 15.5 % (ref 11.5–15.5)
WBC Count: 4.7 10*3/uL (ref 4.0–10.5)
nRBC: 0 % (ref 0.0–0.2)

## 2020-04-17 LAB — CMP (CANCER CENTER ONLY)
ALT: 11 U/L (ref 0–44)
AST: 17 U/L (ref 15–41)
Albumin: 3.7 g/dL (ref 3.5–5.0)
Alkaline Phosphatase: 110 U/L (ref 38–126)
Anion gap: 8 (ref 5–15)
BUN: 38 mg/dL — ABNORMAL HIGH (ref 8–23)
CO2: 21 mmol/L — ABNORMAL LOW (ref 22–32)
Calcium: 10 mg/dL (ref 8.9–10.3)
Chloride: 114 mmol/L — ABNORMAL HIGH (ref 98–111)
Creatinine: 1.81 mg/dL — ABNORMAL HIGH (ref 0.44–1.00)
GFR, Est AFR Am: 30 mL/min — ABNORMAL LOW (ref >60)
GFR, Estimated: 26 mL/min — ABNORMAL LOW (ref >60)
Glucose, Bld: 91 mg/dL (ref 70–99)
Potassium: 4.8 mmol/L (ref 3.5–5.1)
Sodium: 143 mmol/L (ref 135–145)
Total Bilirubin: 0.4 mg/dL (ref 0.3–1.2)
Total Protein: 7.6 g/dL (ref 6.5–8.1)

## 2020-04-17 LAB — SAMPLE TO BLOOD BANK

## 2020-04-17 MED ORDER — DARBEPOETIN ALFA 300 MCG/0.6ML IJ SOSY
300.0000 ug | PREFILLED_SYRINGE | Freq: Once | INTRAMUSCULAR | Status: AC
  Administered 2020-04-17: 300 ug via SUBCUTANEOUS

## 2020-04-17 MED ORDER — DARBEPOETIN ALFA 300 MCG/0.6ML IJ SOSY
PREFILLED_SYRINGE | INTRAMUSCULAR | Status: AC
  Filled 2020-04-17: qty 0.6

## 2020-04-17 NOTE — Patient Instructions (Signed)
Darbepoetin Alfa injection What is this medicine? DARBEPOETIN ALFA (dar be POE e tin AL fa) helps your body make more red blood cells. It is used to treat anemia caused by chronic kidney failure and chemotherapy. This medicine may be used for other purposes; ask your health care provider or pharmacist if you have questions. COMMON BRAND NAME(S): Aranesp What should I tell my health care provider before I take this medicine? They need to know if you have any of these conditions:  blood clotting disorders or history of blood clots  cancer patient not on chemotherapy  cystic fibrosis  heart disease, such as angina, heart failure, or a history of a heart attack  hemoglobin level of 12 g/dL or greater  high blood pressure  low levels of folate, iron, or vitamin B12  seizures  an unusual or allergic reaction to darbepoetin, erythropoietin, albumin, hamster proteins, latex, other medicines, foods, dyes, or preservatives  pregnant or trying to get pregnant  breast-feeding How should I use this medicine? This medicine is for injection into a vein or under the skin. It is usually given by a health care professional in a hospital or clinic setting. If you get this medicine at home, you will be taught how to prepare and give this medicine. Use exactly as directed. Take your medicine at regular intervals. Do not take your medicine more often than directed. It is important that you put your used needles and syringes in a special sharps container. Do not put them in a trash can. If you do not have a sharps container, call your pharmacist or healthcare provider to get one. A special MedGuide will be given to you by the pharmacist with each prescription and refill. Be sure to read this information carefully each time. Talk to your pediatrician regarding the use of this medicine in children. While this medicine may be used in children as young as 1 month of age for selected conditions, precautions do  apply. Overdosage: If you think you have taken too much of this medicine contact a poison control center or emergency room at once. NOTE: This medicine is only for you. Do not share this medicine with others. What if I miss a dose? If you miss a dose, take it as soon as you can. If it is almost time for your next dose, take only that dose. Do not take double or extra doses. What may interact with this medicine? Do not take this medicine with any of the following medications:  epoetin alfa This list may not describe all possible interactions. Give your health care provider a list of all the medicines, herbs, non-prescription drugs, or dietary supplements you use. Also tell them if you smoke, drink alcohol, or use illegal drugs. Some items may interact with your medicine. What should I watch for while using this medicine? Your condition will be monitored carefully while you are receiving this medicine. You may need blood work done while you are taking this medicine. This medicine may cause a decrease in vitamin B6. You should make sure that you get enough vitamin B6 while you are taking this medicine. Discuss the foods you eat and the vitamins you take with your health care professional. What side effects may I notice from receiving this medicine? Side effects that you should report to your doctor or health care professional as soon as possible:  allergic reactions like skin rash, itching or hives, swelling of the face, lips, or tongue  breathing problems  changes in   vision  chest pain  confusion, trouble speaking or understanding  feeling faint or lightheaded, falls  high blood pressure  muscle aches or pains  pain, swelling, warmth in the leg  rapid weight gain  severe headaches  sudden numbness or weakness of the face, arm or leg  trouble walking, dizziness, loss of balance or coordination  seizures (convulsions)  swelling of the ankles, feet, hands  unusually weak or  tired Side effects that usually do not require medical attention (report to your doctor or health care professional if they continue or are bothersome):  diarrhea  fever, chills (flu-like symptoms)  headaches  nausea, vomiting  redness, stinging, or swelling at site where injected This list may not describe all possible side effects. Call your doctor for medical advice about side effects. You may report side effects to FDA at 1-800-FDA-1088. Where should I keep my medicine? Keep out of the reach of children. Store in a refrigerator between 2 and 8 degrees C (36 and 46 degrees F). Do not freeze. Do not shake. Throw away any unused portion if using a single-dose vial. Throw away any unused medicine after the expiration date. NOTE: This sheet is a summary. It may not cover all possible information. If you have questions about this medicine, talk to your doctor, pharmacist, or health care provider.  2020 Elsevier/Gold Standard (2017-10-04 16:44:20)  

## 2020-04-17 NOTE — Progress Notes (Signed)
Hematology and Oncology Follow Up Visit  Susan [name] [id number] 13-Feb-1941 79 y.o. 04/17/2020 12:07 PM Sonia Side., FNPShadad, Mathis Dad, MD   Principle Diagnosis:  29 year old woman with multifactorial anemia with renal disease as well as iron deficiency noted since 2010.    Current therapy:  Aranesp 300 mcg every 4 weeks to keep her hemoglobin above 11.   Interim History:  Susan [name] resents today for a repeat evaluation.  Since the last visit, she reports no major changes in her health.  She has reported improvement in her mobility as well as oral intake and has gained more weight.  She does have some sinus congestion mucus production but overall no shortness of breath cough or wheezing.       Medications: Updated without changes. Current Outpatient Medications  Medication Sig Dispense Refill  . acetaminophen (TYLENOL) 500 MG tablet Take 500-1,000 mg by mouth every 6 (six) hours as needed (for pain.).    [name] amLODipine (NORVASC) 10 MG tablet TAKE 1/2 TABLET BY MOUTH DAILY AT BEDTIME; (TAKE 1 TABLET IF SBP IS >150) (Patient taking differently: Take 10 mg by mouth at bedtime. (TAKE 1 TABLET IF SBP IS >150)) 90 tablet 0  . aspirin 81 MG chewable tablet Chew 81 mg by mouth daily.     . Cholecalciferol (VITAMIN D-3) 5000 UNITS TABS Take 15,000 Units by mouth daily.     . Darbepoetin Alfa (ARANESP, ALBUMIN FREE,) 300 MCG/ML SOLN Inject 300 mcg into the skin See admin instructions. USE INJECTION EVERY 3 WEEKS AS NEEDED FOR HEMOGLOBIN LESS THAN 11.    . diclofenac (VOLTAREN) 75 MG EC tablet TAKE 1 TABLET(75 MG) BY MOUTH TWICE DAILY 180 tablet 0  . diphenhydrAMINE (BENADRYL) 25 mg capsule One capsule every 6 hours x 3 days, then as needed for symptoms of rash/itching 30 capsule 0  . HYDROcodone-acetaminophen (NORCO/VICODIN) 5-325 MG tablet Take 1 tablet by mouth every 6 (six) hours as needed for moderate pain. 20 tablet 0  . hydroxypropyl methylcellulose / hypromellose (ISOPTO TEARS /  GONIOVISC) 2.5 % ophthalmic solution Place 1-2 drops into both eyes 3 (three) times daily as needed for dry eyes.    [name] labetalol (NORMODYNE) 300 MG tablet TAKE 1 TABLET BY MOUTH TWICE DAILY 180 tablet 1  . levothyroxine (SYNTHROID) 100 MCG tablet Take 1 tablet daily on an empty stomach with only water for 30 minutes & no Antacid meds, Calcium or Magnesium for 4 hours & avoid Biotin 90 tablet 3  . Magnesium 500 MG TABS Take 500 mg by mouth every evening.     . memantine (NAMENDA) 10 MG tablet Take 1 tablet (10 mg total) by mouth 2 (two) times daily. 60 tablet 11  . OMEPRAZOLE PO Take 1 tablet by mouth as needed.     . pantoprazole (PROTONIX) 40 MG tablet TK 1 T PO QD FOR ACID REFLUX  0   No current facility-administered medications for this visit.       Physical Exam:  Blood pressure 107/64, pulse 69, temperature 97.7 F (36.5 C), temperature source Temporal, resp. rate 18, height 5' 2.5" (1.588 m), weight 127 lb 4.8 oz (57.7 kg), SpO2 100 %.   ECOG: 1   General appearance: Alert, awake without any distress. Head: Atraumatic without abnormalities Oropharynx: Without any thrush or ulcers. Eyes: No scleral icterus. Lymph nodes: No lymphadenopathy noted in the cervical, supraclavicular, or axillary nodes Heart:regular rate and rhythm, without any murmurs or gallops.   Lung: Clear to auscultation  without any rhonchi, wheezes or dullness to percussion. Abdomin: Soft, nontender without any shifting dullness or ascites. Musculoskeletal: No clubbing or cyanosis. Neurological: No motor or sensory deficits. Skin: No rashes or lesions. =      Lab Results: Lab Results  Component Value Date   WBC 4.7 04/17/2020   HGB 9.2 (L) 04/17/2020   HCT 28.0 (L) 04/17/2020   MCV 81.2 04/17/2020   PLT 96 (L) 04/17/2020     Chemistry      Component Value Date/Time   NA 143 03/20/2020 0919   NA 143 06/30/2017 0846   K 4.0 03/20/2020 0919   K 4.4 06/30/2017 0846   CL 110 03/20/2020 0919    CO2 24 03/20/2020 0919   CO2 24 06/30/2017 0846   BUN 27 (H) 03/20/2020 0919   BUN 26.5 (H) 06/30/2017 0846   CREATININE 1.31 (H) 03/20/2020 0919   CREATININE 1.31 (H) 07/02/2018 0950   CREATININE 1.4 (H) 06/30/2017 0846      Component Value Date/Time   CALCIUM 10.4 (H) 03/20/2020 0919   CALCIUM 10.1 06/30/2017 0846   ALKPHOS 114 03/20/2020 0919   ALKPHOS 101 06/30/2017 0846   AST 18 03/20/2020 0919   AST 17 06/30/2017 0846   ALT 11 03/20/2020 0919   ALT <6 06/30/2017 0846   BILITOT 0.5 03/20/2020 0919   BILITOT 0.76 06/30/2017 0846      Impression and Plan:   79 year old woman with:   1.  Multifactorial anemia related to anemia of renal disease diagnosed in 2010.  Her hemoglobin is improving with Aranesp and I recommended continuing for the time being.  Risks and benefits of continuing this treatments were reviewed today.  Potential complications including hypertension and injection related complications.  He is agreeable to continue at this time.  2.  Hypertension: No issues reported with normal blood pressure at this time.  3.  Weight loss: Improved at this time.  4.  Follow-up: Monthly for Aranesp and MD follow-up in 6 months.  20  minutes were spent on this encounter.  Time was dedicated to reviewing laboratory data, discussing disease status and future plan of care reviewed.         Zola Button MD 7/16/202112:07 PM

## 2020-04-20 ENCOUNTER — Telehealth: Payer: Self-pay | Admitting: Oncology

## 2020-04-20 NOTE — Telephone Encounter (Signed)
Scheduled per 07/16 los, patient has been called and notified.

## 2020-05-22 ENCOUNTER — Other Ambulatory Visit: Payer: Self-pay

## 2020-05-22 ENCOUNTER — Inpatient Hospital Stay: Payer: Medicare Other | Attending: Oncology

## 2020-05-22 ENCOUNTER — Inpatient Hospital Stay: Payer: Medicare Other

## 2020-05-22 VITALS — BP 128/64 | HR 71 | Resp 18

## 2020-05-22 DIAGNOSIS — E611 Iron deficiency: Secondary | ICD-10-CM | POA: Insufficient documentation

## 2020-05-22 DIAGNOSIS — N183 Chronic kidney disease, stage 3 unspecified: Secondary | ICD-10-CM | POA: Diagnosis present

## 2020-05-22 DIAGNOSIS — N1831 Chronic kidney disease, stage 3a: Secondary | ICD-10-CM

## 2020-05-22 DIAGNOSIS — D631 Anemia in chronic kidney disease: Secondary | ICD-10-CM

## 2020-05-22 DIAGNOSIS — I129 Hypertensive chronic kidney disease with stage 1 through stage 4 chronic kidney disease, or unspecified chronic kidney disease: Secondary | ICD-10-CM | POA: Diagnosis not present

## 2020-05-22 DIAGNOSIS — Z79899 Other long term (current) drug therapy: Secondary | ICD-10-CM | POA: Diagnosis not present

## 2020-05-22 LAB — CBC WITH DIFFERENTIAL (CANCER CENTER ONLY)
Abs Immature Granulocytes: 0.01 10*3/uL (ref 0.00–0.07)
Basophils Absolute: 0 10*3/uL (ref 0.0–0.1)
Basophils Relative: 0 %
Eosinophils Absolute: 0.1 10*3/uL (ref 0.0–0.5)
Eosinophils Relative: 2 %
HCT: 26.5 % — ABNORMAL LOW (ref 36.0–46.0)
Hemoglobin: 8.6 g/dL — ABNORMAL LOW (ref 12.0–15.0)
Immature Granulocytes: 0 %
Lymphocytes Relative: 49 %
Lymphs Abs: 1.9 10*3/uL (ref 0.7–4.0)
MCH: 24.9 pg — ABNORMAL LOW (ref 26.0–34.0)
MCHC: 32.5 g/dL (ref 30.0–36.0)
MCV: 76.8 fL — ABNORMAL LOW (ref 80.0–100.0)
Monocytes Absolute: 0.2 10*3/uL (ref 0.1–1.0)
Monocytes Relative: 5 %
Neutro Abs: 1.7 10*3/uL (ref 1.7–7.7)
Neutrophils Relative %: 44 %
Platelet Count: 142 10*3/uL — ABNORMAL LOW (ref 150–400)
RBC: 3.45 MIL/uL — ABNORMAL LOW (ref 3.87–5.11)
RDW: 17 % — ABNORMAL HIGH (ref 11.5–15.5)
WBC Count: 3.9 10*3/uL — ABNORMAL LOW (ref 4.0–10.5)
nRBC: 0 % (ref 0.0–0.2)

## 2020-05-22 LAB — CMP (CANCER CENTER ONLY)
ALT: 20 U/L (ref 0–44)
AST: 27 U/L (ref 15–41)
Albumin: 3.7 g/dL (ref 3.5–5.0)
Alkaline Phosphatase: 106 U/L (ref 38–126)
Anion gap: 8 (ref 5–15)
BUN: 33 mg/dL — ABNORMAL HIGH (ref 8–23)
CO2: 24 mmol/L (ref 22–32)
Calcium: 10.5 mg/dL — ABNORMAL HIGH (ref 8.9–10.3)
Chloride: 109 mmol/L (ref 98–111)
Creatinine: 1.79 mg/dL — ABNORMAL HIGH (ref 0.44–1.00)
GFR, Est AFR Am: 30 mL/min — ABNORMAL LOW (ref >60)
GFR, Estimated: 26 mL/min — ABNORMAL LOW (ref >60)
Glucose, Bld: 97 mg/dL (ref 70–99)
Potassium: 4 mmol/L (ref 3.5–5.1)
Sodium: 141 mmol/L (ref 135–145)
Total Bilirubin: 0.5 mg/dL (ref 0.3–1.2)
Total Protein: 7.8 g/dL (ref 6.5–8.1)

## 2020-05-22 LAB — SAMPLE TO BLOOD BANK

## 2020-05-22 MED ORDER — DARBEPOETIN ALFA 300 MCG/0.6ML IJ SOSY
300.0000 ug | PREFILLED_SYRINGE | Freq: Once | INTRAMUSCULAR | Status: AC
  Administered 2020-05-22: 300 ug via SUBCUTANEOUS

## 2020-05-22 MED ORDER — DARBEPOETIN ALFA 300 MCG/0.6ML IJ SOSY
PREFILLED_SYRINGE | INTRAMUSCULAR | Status: AC
  Filled 2020-05-22: qty 0.6

## 2020-05-22 NOTE — Patient Instructions (Signed)
Darbepoetin Alfa injection What is this medicine? DARBEPOETIN ALFA (dar be POE e tin AL fa) helps your body make more red blood cells. It is used to treat anemia caused by chronic kidney failure and chemotherapy. This medicine may be used for other purposes; ask your health care provider or pharmacist if you have questions. COMMON BRAND NAME(S): Aranesp What should I tell my health care provider before I take this medicine? They need to know if you have any of these conditions:  blood clotting disorders or history of blood clots  cancer patient not on chemotherapy  cystic fibrosis  heart disease, such as angina, heart failure, or a history of a heart attack  hemoglobin level of 12 g/dL or greater  high blood pressure  low levels of folate, iron, or vitamin B12  seizures  an unusual or allergic reaction to darbepoetin, erythropoietin, albumin, hamster proteins, latex, other medicines, foods, dyes, or preservatives  pregnant or trying to get pregnant  breast-feeding How should I use this medicine? This medicine is for injection into a vein or under the skin. It is usually given by a health care professional in a hospital or clinic setting. If you get this medicine at home, you will be taught how to prepare and give this medicine. Use exactly as directed. Take your medicine at regular intervals. Do not take your medicine more often than directed. It is important that you put your used needles and syringes in a special sharps container. Do not put them in a trash can. If you do not have a sharps container, call your pharmacist or healthcare provider to get one. A special MedGuide will be given to you by the pharmacist with each prescription and refill. Be sure to read this information carefully each time. Talk to your pediatrician regarding the use of this medicine in children. While this medicine may be used in children as young as 1 month of age for selected conditions, precautions do  apply. Overdosage: If you think you have taken too much of this medicine contact a poison control center or emergency room at once. NOTE: This medicine is only for you. Do not share this medicine with others. What if I miss a dose? If you miss a dose, take it as soon as you can. If it is almost time for your next dose, take only that dose. Do not take double or extra doses. What may interact with this medicine? Do not take this medicine with any of the following medications:  epoetin alfa This list may not describe all possible interactions. Give your health care provider a list of all the medicines, herbs, non-prescription drugs, or dietary supplements you use. Also tell them if you smoke, drink alcohol, or use illegal drugs. Some items may interact with your medicine. What should I watch for while using this medicine? Your condition will be monitored carefully while you are receiving this medicine. You may need blood work done while you are taking this medicine. This medicine may cause a decrease in vitamin B6. You should make sure that you get enough vitamin B6 while you are taking this medicine. Discuss the foods you eat and the vitamins you take with your health care professional. What side effects may I notice from receiving this medicine? Side effects that you should report to your doctor or health care professional as soon as possible:  allergic reactions like skin rash, itching or hives, swelling of the face, lips, or tongue  breathing problems  changes in   vision  chest pain  confusion, trouble speaking or understanding  feeling faint or lightheaded, falls  high blood pressure  muscle aches or pains  pain, swelling, warmth in the leg  rapid weight gain  severe headaches  sudden numbness or weakness of the face, arm or leg  trouble walking, dizziness, loss of balance or coordination  seizures (convulsions)  swelling of the ankles, feet, hands  unusually weak or  tired Side effects that usually do not require medical attention (report to your doctor or health care professional if they continue or are bothersome):  diarrhea  fever, chills (flu-like symptoms)  headaches  nausea, vomiting  redness, stinging, or swelling at site where injected This list may not describe all possible side effects. Call your doctor for medical advice about side effects. You may report side effects to FDA at 1-800-FDA-1088. Where should I keep my medicine? Keep out of the reach of children. Store in a refrigerator between 2 and 8 degrees C (36 and 46 degrees F). Do not freeze. Do not shake. Throw away any unused portion if using a single-dose vial. Throw away any unused medicine after the expiration date. NOTE: This sheet is a summary. It may not cover all possible information. If you have questions about this medicine, talk to your doctor, pharmacist, or health care provider.  2020 Elsevier/Gold Standard (2017-10-04 16:44:20)  

## 2020-06-02 ENCOUNTER — Encounter: Payer: Self-pay | Admitting: Neurology

## 2020-06-02 ENCOUNTER — Ambulatory Visit (INDEPENDENT_AMBULATORY_CARE_PROVIDER_SITE_OTHER): Payer: Medicare Other | Admitting: Neurology

## 2020-06-02 ENCOUNTER — Telehealth: Payer: Self-pay | Admitting: Neurology

## 2020-06-02 ENCOUNTER — Other Ambulatory Visit: Payer: Self-pay

## 2020-06-02 VITALS — BP 116/67 | HR 59 | Ht 62.0 in | Wt 120.0 lb

## 2020-06-02 DIAGNOSIS — F0390 Unspecified dementia without behavioral disturbance: Secondary | ICD-10-CM

## 2020-06-02 NOTE — Telephone Encounter (Signed)
Mri of the brain ordered by Dr.Yan in May, was not completed, patient still wants it, can it be done?
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
## 2020-06-02 NOTE — Patient Instructions (Signed)
Will get MRI of the brain set up  Continue Namenda at current dosing See you back in 6 months

## 2020-06-04 ENCOUNTER — Other Ambulatory Visit: Payer: Self-pay

## 2020-06-04 ENCOUNTER — Ambulatory Visit
Admission: RE | Admit: 2020-06-04 | Discharge: 2020-06-04 | Disposition: A | Discharge status: Home / Self Care | Payer: Medicare Other | Source: Ambulatory Visit | Attending: Neurology | Admitting: Neurology

## 2020-06-04 DIAGNOSIS — F0390 Unspecified dementia without behavioral disturbance: Secondary | ICD-10-CM

## 2020-06-09 ENCOUNTER — Telehealth: Payer: Self-pay | Admitting: Neurology

## 2020-06-09 DIAGNOSIS — I679 Cerebrovascular disease, unspecified: Secondary | ICD-10-CM

## 2020-06-09 NOTE — Telephone Encounter (Signed)
  IMPRESSION: This MRI of the brain without contrast shows the following: 1.  Mild to moderate generalized cortical atrophy that has progressed compared to the 2017 MRI. 2.  Extensive T2/FLAIR hyperintense foci in the hemispheres, consistent with chronic microvascular ischemic change and just mildly progressed compared to the 2017 MRI. 3.  Hyperintense focus on diffusion-weighted images in the right frontal lobe consistent with a more acute microvascular ischemic lesion. 4.  About 15 chronic microhemorrhages in the gray-white junction.  Many of these are in the right temporal lobe.  These could be due to cerebral amyloid angiopathy.  Please call patient, MRI of the brain showed progression of generalized atrophy, extensive supratentorium small vessel disease, also mild progression compared to 2017  In addition, there is evidence of acute microvascular ischemic lesion at the right frontal lobe,  Evidence of microhemorrhage, consistent with cerebral amyloid angiopathy  I have ordered echocardiogram, ultrasound of carotid artery for further evaluation  She should continue aspirin 81 mg daily  Orders Placed This Encounter  Procedures  . ECHOCARDIOGRAM COMPLETE  . VAS US CAROTID

## 2020-06-09 NOTE — Telephone Encounter (Signed)
I have spoken to the patient and she verbalized understanding of her MRI results.  She is agreeable to the ECHO and carotid ultrasound.

## 2020-06-18 NOTE — Telephone Encounter (Signed)
Echo Approved I sent to Edmon Crape at [place] to schedule .   Can you get this patient scheduled for an echo No auth req. Reference #  [id number] Thanks Hinton Dyer

## 2020-06-19 ENCOUNTER — Inpatient Hospital Stay: Payer: Medicare Other | Attending: Oncology

## 2020-06-19 ENCOUNTER — Inpatient Hospital Stay: Payer: Medicare Other

## 2020-06-24 ENCOUNTER — Ambulatory Visit (HOSPITAL_BASED_OUTPATIENT_CLINIC_OR_DEPARTMENT_OTHER)
Admission: RE | Admit: 2020-06-24 | Discharge: 2020-06-24 | Disposition: A | Discharge status: Home / Self Care | Payer: Medicare Other | Source: Ambulatory Visit | Attending: Neurology | Admitting: Neurology

## 2020-06-24 ENCOUNTER — Ambulatory Visit (HOSPITAL_COMMUNITY)
Admission: RE | Admit: 2020-06-24 | Discharge: 2020-06-24 | Disposition: A | Discharge status: Home / Self Care | Payer: Medicare Other | Source: Ambulatory Visit | Attending: Neurology | Admitting: Neurology

## 2020-06-24 ENCOUNTER — Other Ambulatory Visit: Payer: Self-pay

## 2020-06-24 DIAGNOSIS — E785 Hyperlipidemia, unspecified: Secondary | ICD-10-CM | POA: Diagnosis not present

## 2020-06-24 DIAGNOSIS — F17200 Nicotine dependence, unspecified, uncomplicated: Secondary | ICD-10-CM | POA: Insufficient documentation

## 2020-06-24 DIAGNOSIS — I10 Essential (primary) hypertension: Secondary | ICD-10-CM

## 2020-06-24 DIAGNOSIS — I679 Cerebrovascular disease, unspecified: Secondary | ICD-10-CM

## 2020-06-24 LAB — ECHOCARDIOGRAM COMPLETE
Area-P 1/2: 2.07 cm2
S' Lateral: 2.8 cm

## 2020-06-24 NOTE — Progress Notes (Signed)
Carotid duplex has been completed.   Preliminary results in CV Proc.   Abram Sander 06/24/2020 1:12 PM

## 2020-06-24 NOTE — Progress Notes (Signed)
  Echocardiogram 2D Echocardiogram has been performed.  Susan [name] 06/24/2020, 1:14 PM

## 2020-07-17 ENCOUNTER — Other Ambulatory Visit: Payer: Self-pay

## 2020-07-17 ENCOUNTER — Inpatient Hospital Stay: Payer: Medicare Other

## 2020-07-17 ENCOUNTER — Inpatient Hospital Stay: Payer: Medicare Other | Attending: Oncology

## 2020-07-17 ENCOUNTER — Telehealth: Payer: Self-pay

## 2020-07-17 VITALS — BP 131/73 | HR 65 | Temp 98.5°F | Resp 18

## 2020-07-17 DIAGNOSIS — N1831 Chronic kidney disease, stage 3a: Secondary | ICD-10-CM

## 2020-07-17 DIAGNOSIS — D631 Anemia in chronic kidney disease: Secondary | ICD-10-CM

## 2020-07-17 DIAGNOSIS — N189 Chronic kidney disease, unspecified: Secondary | ICD-10-CM

## 2020-07-17 DIAGNOSIS — N183 Chronic kidney disease, stage 3 unspecified: Secondary | ICD-10-CM | POA: Diagnosis present

## 2020-07-17 DIAGNOSIS — I129 Hypertensive chronic kidney disease with stage 1 through stage 4 chronic kidney disease, or unspecified chronic kidney disease: Secondary | ICD-10-CM | POA: Insufficient documentation

## 2020-07-17 LAB — CBC WITH DIFFERENTIAL (CANCER CENTER ONLY)
Abs Immature Granulocytes: 0.02 10*3/uL (ref 0.00–0.07)
Basophils Absolute: 0 10*3/uL (ref 0.0–0.1)
Basophils Relative: 0 %
Eosinophils Absolute: 0.1 10*3/uL (ref 0.0–0.5)
Eosinophils Relative: 2 %
HCT: 22.9 % — ABNORMAL LOW (ref 36.0–46.0)
Hemoglobin: 7.6 g/dL — ABNORMAL LOW (ref 12.0–15.0)
Immature Granulocytes: 0 %
Lymphocytes Relative: 36 %
Lymphs Abs: 1.7 10*3/uL (ref 0.7–4.0)
MCH: 26.9 pg (ref 26.0–34.0)
MCHC: 33.2 g/dL (ref 30.0–36.0)
MCV: 80.9 fL (ref 80.0–100.0)
Monocytes Absolute: 0.3 10*3/uL (ref 0.1–1.0)
Monocytes Relative: 7 %
Neutro Abs: 2.5 10*3/uL (ref 1.7–7.7)
Neutrophils Relative %: 55 %
Platelet Count: 97 10*3/uL — ABNORMAL LOW (ref 150–400)
RBC: 2.83 MIL/uL — ABNORMAL LOW (ref 3.87–5.11)
RDW: 16.6 % — ABNORMAL HIGH (ref 11.5–15.5)
WBC Count: 4.7 10*3/uL (ref 4.0–10.5)
nRBC: 0 % (ref 0.0–0.2)

## 2020-07-17 LAB — CMP (CANCER CENTER ONLY)
ALT: 14 U/L (ref 0–44)
AST: 20 U/L (ref 15–41)
Albumin: 3.9 g/dL (ref 3.5–5.0)
Alkaline Phosphatase: 122 U/L (ref 38–126)
Anion gap: 6 (ref 5–15)
BUN: 36 mg/dL — ABNORMAL HIGH (ref 8–23)
CO2: 24 mmol/L (ref 22–32)
Calcium: 9.9 mg/dL (ref 8.9–10.3)
Chloride: 114 mmol/L — ABNORMAL HIGH (ref 98–111)
Creatinine: 1.62 mg/dL — ABNORMAL HIGH (ref 0.44–1.00)
GFR, Estimated: 30 mL/min — ABNORMAL LOW (ref >60)
Glucose, Bld: 78 mg/dL (ref 70–99)
Potassium: 3.8 mmol/L (ref 3.5–5.1)
Sodium: 144 mmol/L (ref 135–145)
Total Bilirubin: 0.4 mg/dL (ref 0.3–1.2)
Total Protein: 7.4 g/dL (ref 6.5–8.1)

## 2020-07-17 LAB — SAMPLE TO BLOOD BANK

## 2020-07-17 MED ORDER — DARBEPOETIN ALFA 300 MCG/0.6ML IJ SOSY
PREFILLED_SYRINGE | INTRAMUSCULAR | Status: AC
  Filled 2020-07-17: qty 0.6

## 2020-07-17 MED ORDER — DARBEPOETIN ALFA 300 MCG/0.6ML IJ SOSY
300.0000 ug | PREFILLED_SYRINGE | Freq: Once | INTRAMUSCULAR | Status: AC
  Administered 2020-07-17: 300 ug via SUBCUTANEOUS

## 2020-07-17 NOTE — Progress Notes (Signed)
Spoke to [place] in blood bank to confirm orders for blood received for transfusion on Monday 07/20/20.

## 2020-07-17 NOTE — Patient Instructions (Signed)
Darbepoetin Alfa injection What is this medicine? DARBEPOETIN ALFA (dar be POE e tin AL fa) helps your body make more red blood cells. It is used to treat anemia caused by chronic kidney failure and chemotherapy. This medicine may be used for other purposes; ask your health care provider or pharmacist if you have questions. COMMON BRAND NAME(S): Aranesp What should I tell my health care provider before I take this medicine? They need to know if you have any of these conditions:  blood clotting disorders or history of blood clots  cancer patient not on chemotherapy  cystic fibrosis  heart disease, such as angina, heart failure, or a history of a heart attack  hemoglobin level of 12 g/dL or greater  high blood pressure  low levels of folate, iron, or vitamin B12  seizures  an unusual or allergic reaction to darbepoetin, erythropoietin, albumin, hamster proteins, latex, other medicines, foods, dyes, or preservatives  pregnant or trying to get pregnant  breast-feeding How should I use this medicine? This medicine is for injection into a vein or under the skin. It is usually given by a health care professional in a hospital or clinic setting. If you get this medicine at home, you will be taught how to prepare and give this medicine. Use exactly as directed. Take your medicine at regular intervals. Do not take your medicine more often than directed. It is important that you put your used needles and syringes in a special sharps container. Do not put them in a trash can. If you do not have a sharps container, call your pharmacist or healthcare provider to get one. A special MedGuide will be given to you by the pharmacist with each prescription and refill. Be sure to read this information carefully each time. Talk to your pediatrician regarding the use of this medicine in children. While this medicine may be used in children as young as 1 month of age for selected conditions, precautions do  apply. Overdosage: If you think you have taken too much of this medicine contact a poison control center or emergency room at once. NOTE: This medicine is only for you. Do not share this medicine with others. What if I miss a dose? If you miss a dose, take it as soon as you can. If it is almost time for your next dose, take only that dose. Do not take double or extra doses. What may interact with this medicine? Do not take this medicine with any of the following medications:  epoetin alfa This list may not describe all possible interactions. Give your health care provider a list of all the medicines, herbs, non-prescription drugs, or dietary supplements you use. Also tell them if you smoke, drink alcohol, or use illegal drugs. Some items may interact with your medicine. What should I watch for while using this medicine? Your condition will be monitored carefully while you are receiving this medicine. You may need blood work done while you are taking this medicine. This medicine may cause a decrease in vitamin B6. You should make sure that you get enough vitamin B6 while you are taking this medicine. Discuss the foods you eat and the vitamins you take with your health care professional. What side effects may I notice from receiving this medicine? Side effects that you should report to your doctor or health care professional as soon as possible:  allergic reactions like skin rash, itching or hives, swelling of the face, lips, or tongue  breathing problems  changes in   vision  chest pain  confusion, trouble speaking or understanding  feeling faint or lightheaded, falls  high blood pressure  muscle aches or pains  pain, swelling, warmth in the leg  rapid weight gain  severe headaches  sudden numbness or weakness of the face, arm or leg  trouble walking, dizziness, loss of balance or coordination  seizures (convulsions)  swelling of the ankles, feet, hands  unusually weak or  tired Side effects that usually do not require medical attention (report to your doctor or health care professional if they continue or are bothersome):  diarrhea  fever, chills (flu-like symptoms)  headaches  nausea, vomiting  redness, stinging, or swelling at site where injected This list may not describe all possible side effects. Call your doctor for medical advice about side effects. You may report side effects to FDA at 1-800-FDA-1088. Where should I keep my medicine? Keep out of the reach of children. Store in a refrigerator between 2 and 8 degrees C (36 and 46 degrees F). Do not freeze. Do not shake. Throw away any unused portion if using a single-dose vial. Throw away any unused medicine after the expiration date. NOTE: This sheet is a summary. It may not cover all possible information. If you have questions about this medicine, talk to your doctor, pharmacist, or health care provider.  2020 Elsevier/Gold Standard (2017-10-04 16:44:20)  

## 2020-07-17 NOTE — Telephone Encounter (Signed)
Received a call from the lab that patient's hemoglobin is 7.6 and a sample is being sent to the blood bank. Dr. Alen Blew made aware and per Dr. Alen Blew patient to receive 2 units of blood next week. Patient scheduled for Monday 07/20/20 at 12:30. Patient is aware of appointment time and not to remove blue blood bank bracelet.

## 2020-07-18 LAB — PREPARE RBC (CROSSMATCH)

## 2020-07-20 ENCOUNTER — Other Ambulatory Visit: Payer: Self-pay

## 2020-07-20 ENCOUNTER — Inpatient Hospital Stay: Payer: Medicare Other

## 2020-07-20 DIAGNOSIS — N189 Chronic kidney disease, unspecified: Secondary | ICD-10-CM

## 2020-07-20 DIAGNOSIS — D631 Anemia in chronic kidney disease: Secondary | ICD-10-CM

## 2020-07-20 DIAGNOSIS — I129 Hypertensive chronic kidney disease with stage 1 through stage 4 chronic kidney disease, or unspecified chronic kidney disease: Secondary | ICD-10-CM | POA: Diagnosis not present

## 2020-07-20 LAB — CBC WITH DIFFERENTIAL (CANCER CENTER ONLY)
Abs Immature Granulocytes: 0.01 10*3/uL (ref 0.00–0.07)
Basophils Absolute: 0 10*3/uL (ref 0.0–0.1)
Basophils Relative: 0 %
Eosinophils Absolute: 0.1 10*3/uL (ref 0.0–0.5)
Eosinophils Relative: 2 %
HCT: 21.1 % — ABNORMAL LOW (ref 36.0–46.0)
Hemoglobin: 7.1 g/dL — ABNORMAL LOW (ref 12.0–15.0)
Immature Granulocytes: 0 %
Lymphocytes Relative: 41 %
Lymphs Abs: 1.9 10*3/uL (ref 0.7–4.0)
MCH: 26.7 pg (ref 26.0–34.0)
MCHC: 33.6 g/dL (ref 30.0–36.0)
MCV: 79.3 fL — ABNORMAL LOW (ref 80.0–100.0)
Monocytes Absolute: 0.3 10*3/uL (ref 0.1–1.0)
Monocytes Relative: 6 %
Neutro Abs: 2.3 10*3/uL (ref 1.7–7.7)
Neutrophils Relative %: 51 %
Platelet Count: 92 10*3/uL — ABNORMAL LOW (ref 150–400)
RBC: 2.66 MIL/uL — ABNORMAL LOW (ref 3.87–5.11)
RDW: 16.9 % — ABNORMAL HIGH (ref 11.5–15.5)
WBC Count: 4.6 10*3/uL (ref 4.0–10.5)
nRBC: 0 % (ref 0.0–0.2)

## 2020-07-20 LAB — CMP (CANCER CENTER ONLY)
ALT: 10 U/L (ref 0–44)
AST: 17 U/L (ref 15–41)
Albumin: 3.8 g/dL (ref 3.5–5.0)
Alkaline Phosphatase: 114 U/L (ref 38–126)
Anion gap: 4 — ABNORMAL LOW (ref 5–15)
BUN: 36 mg/dL — ABNORMAL HIGH (ref 8–23)
CO2: 22 mmol/L (ref 22–32)
Calcium: 10 mg/dL (ref 8.9–10.3)
Chloride: 113 mmol/L — ABNORMAL HIGH (ref 98–111)
Creatinine: 1.62 mg/dL — ABNORMAL HIGH (ref 0.44–1.00)
GFR, Estimated: 29 mL/min — ABNORMAL LOW (ref >60)
Glucose, Bld: 96 mg/dL (ref 70–99)
Potassium: 3.9 mmol/L (ref 3.5–5.1)
Sodium: 139 mmol/L (ref 135–145)
Total Bilirubin: 0.4 mg/dL (ref 0.3–1.2)
Total Protein: 7.2 g/dL (ref 6.5–8.1)

## 2020-07-20 LAB — PREPARE RBC (CROSSMATCH)

## 2020-07-20 LAB — ABO/RH: ABO/RH(D): O POS

## 2020-07-20 MED ORDER — SODIUM CHLORIDE 0.9% IV SOLUTION
250.0000 mL | Freq: Once | INTRAVENOUS | Status: AC
  Administered 2020-07-20: 250 mL via INTRAVENOUS
  Filled 2020-07-20: qty 250

## 2020-07-20 MED ORDER — DIPHENHYDRAMINE HCL 25 MG PO CAPS
ORAL_CAPSULE | ORAL | Status: AC
  Filled 2020-07-20: qty 1

## 2020-07-20 MED ORDER — ACETAMINOPHEN 325 MG PO TABS
ORAL_TABLET | ORAL | Status: AC
  Filled 2020-07-20: qty 2

## 2020-07-20 MED ORDER — ACETAMINOPHEN 325 MG PO TABS
650.0000 mg | ORAL_TABLET | Freq: Once | ORAL | Status: AC
  Administered 2020-07-20: 650 mg via ORAL

## 2020-07-20 MED ORDER — DIPHENHYDRAMINE HCL 25 MG PO CAPS
25.0000 mg | ORAL_CAPSULE | Freq: Once | ORAL | Status: AC
  Administered 2020-07-20: 25 mg via ORAL

## 2020-07-20 NOTE — Patient Instructions (Signed)

## 2020-07-21 ENCOUNTER — Other Ambulatory Visit: Payer: Self-pay

## 2020-07-21 ENCOUNTER — Inpatient Hospital Stay: Payer: Medicare Other

## 2020-07-21 DIAGNOSIS — D631 Anemia in chronic kidney disease: Secondary | ICD-10-CM

## 2020-07-21 DIAGNOSIS — I129 Hypertensive chronic kidney disease with stage 1 through stage 4 chronic kidney disease, or unspecified chronic kidney disease: Secondary | ICD-10-CM | POA: Diagnosis not present

## 2020-07-21 DIAGNOSIS — N189 Chronic kidney disease, unspecified: Secondary | ICD-10-CM

## 2020-07-21 LAB — BPAM RBC
Blood Product Expiration Date: 202111112359
Blood Product Expiration Date: 202111182359
ISSUE DATE / TIME: 202110181442
Unit Type and Rh: 5100
Unit Type and Rh: 5100

## 2020-07-21 LAB — TYPE AND SCREEN
ABO/RH(D): O POS
Antibody Screen: NEGATIVE
Unit division: 0
Unit division: 0

## 2020-07-21 MED ORDER — DIPHENHYDRAMINE HCL 25 MG PO CAPS
25.0000 mg | ORAL_CAPSULE | Freq: Once | ORAL | Status: AC
  Administered 2020-07-21: 25 mg via ORAL

## 2020-07-21 MED ORDER — ACETAMINOPHEN 325 MG PO TABS
650.0000 mg | ORAL_TABLET | Freq: Once | ORAL | Status: AC
  Administered 2020-07-21: 650 mg via ORAL

## 2020-07-21 MED ORDER — ACETAMINOPHEN 325 MG PO TABS
ORAL_TABLET | ORAL | Status: AC
  Filled 2020-07-21: qty 2

## 2020-07-21 MED ORDER — DIPHENHYDRAMINE HCL 25 MG PO CAPS
ORAL_CAPSULE | ORAL | Status: AC
  Filled 2020-07-21: qty 1

## 2020-07-21 MED ORDER — SODIUM CHLORIDE 0.9% IV SOLUTION
250.0000 mL | Freq: Once | INTRAVENOUS | Status: AC
  Administered 2020-07-21: 250 mL via INTRAVENOUS
  Filled 2020-07-21: qty 250

## 2020-07-21 NOTE — Patient Instructions (Signed)

## 2020-07-22 LAB — TYPE AND SCREEN
ABO/RH(D): O POS
Antibody Screen: NEGATIVE
Unit division: 0

## 2020-07-22 LAB — BPAM RBC
Blood Product Expiration Date: 202111182359
ISSUE DATE / TIME: 202110191404
Unit Type and Rh: 5100

## 2020-08-21 ENCOUNTER — Inpatient Hospital Stay: Payer: Medicare Other | Attending: Oncology

## 2020-08-21 ENCOUNTER — Inpatient Hospital Stay (HOSPITAL_BASED_OUTPATIENT_CLINIC_OR_DEPARTMENT_OTHER): Payer: Medicare Other | Admitting: Oncology

## 2020-08-21 ENCOUNTER — Other Ambulatory Visit: Payer: Self-pay

## 2020-08-21 ENCOUNTER — Inpatient Hospital Stay: Payer: Medicare Other

## 2020-08-21 VITALS — BP 114/75 | HR 56 | Resp 18

## 2020-08-21 VITALS — BP 107/68 | HR 69 | Temp 97.7°F | Resp 16 | Ht 62.0 in | Wt 129.8 lb

## 2020-08-21 DIAGNOSIS — N183 Chronic kidney disease, stage 3 unspecified: Secondary | ICD-10-CM | POA: Insufficient documentation

## 2020-08-21 DIAGNOSIS — D631 Anemia in chronic kidney disease: Secondary | ICD-10-CM | POA: Insufficient documentation

## 2020-08-21 DIAGNOSIS — D469 Myelodysplastic syndrome, unspecified: Secondary | ICD-10-CM | POA: Insufficient documentation

## 2020-08-21 DIAGNOSIS — Z79899 Other long term (current) drug therapy: Secondary | ICD-10-CM | POA: Diagnosis not present

## 2020-08-21 DIAGNOSIS — N189 Chronic kidney disease, unspecified: Secondary | ICD-10-CM

## 2020-08-21 DIAGNOSIS — I129 Hypertensive chronic kidney disease with stage 1 through stage 4 chronic kidney disease, or unspecified chronic kidney disease: Secondary | ICD-10-CM | POA: Insufficient documentation

## 2020-08-21 DIAGNOSIS — R634 Abnormal weight loss: Secondary | ICD-10-CM | POA: Diagnosis not present

## 2020-08-21 DIAGNOSIS — N1831 Chronic kidney disease, stage 3a: Secondary | ICD-10-CM

## 2020-08-21 LAB — CBC WITH DIFFERENTIAL (CANCER CENTER ONLY)
Abs Immature Granulocytes: 0.01 10*3/uL (ref 0.00–0.07)
Basophils Absolute: 0 10*3/uL (ref 0.0–0.1)
Basophils Relative: 0 %
Eosinophils Absolute: 0.1 10*3/uL (ref 0.0–0.5)
Eosinophils Relative: 2 %
HCT: 25.1 % — ABNORMAL LOW (ref 36.0–46.0)
Hemoglobin: 8.2 g/dL — ABNORMAL LOW (ref 12.0–15.0)
Immature Granulocytes: 0 %
Lymphocytes Relative: 39 %
Lymphs Abs: 1.6 10*3/uL (ref 0.7–4.0)
MCH: 27 pg (ref 26.0–34.0)
MCHC: 32.7 g/dL (ref 30.0–36.0)
MCV: 82.6 fL (ref 80.0–100.0)
Monocytes Absolute: 0.3 10*3/uL (ref 0.1–1.0)
Monocytes Relative: 8 %
Neutro Abs: 2.2 10*3/uL (ref 1.7–7.7)
Neutrophils Relative %: 51 %
Platelet Count: 122 10*3/uL — ABNORMAL LOW (ref 150–400)
RBC: 3.04 MIL/uL — ABNORMAL LOW (ref 3.87–5.11)
RDW: 16.7 % — ABNORMAL HIGH (ref 11.5–15.5)
WBC Count: 4.1 10*3/uL (ref 4.0–10.5)
nRBC: 0 % (ref 0.0–0.2)

## 2020-08-21 LAB — CMP (CANCER CENTER ONLY)
ALT: 8 U/L (ref 0–44)
AST: 17 U/L (ref 15–41)
Albumin: 3.7 g/dL (ref 3.5–5.0)
Alkaline Phosphatase: 112 U/L (ref 38–126)
Anion gap: 7 (ref 5–15)
BUN: 36 mg/dL — ABNORMAL HIGH (ref 8–23)
CO2: 22 mmol/L (ref 22–32)
Calcium: 9.6 mg/dL (ref 8.9–10.3)
Chloride: 114 mmol/L — ABNORMAL HIGH (ref 98–111)
Creatinine: 1.61 mg/dL — ABNORMAL HIGH (ref 0.44–1.00)
GFR, Estimated: 32 mL/min — ABNORMAL LOW (ref >60)
Glucose, Bld: 89 mg/dL (ref 70–99)
Potassium: 3.8 mmol/L (ref 3.5–5.1)
Sodium: 143 mmol/L (ref 135–145)
Total Bilirubin: 0.4 mg/dL (ref 0.3–1.2)
Total Protein: 7.8 g/dL (ref 6.5–8.1)

## 2020-08-21 LAB — SAMPLE TO BLOOD BANK

## 2020-08-21 MED ORDER — DARBEPOETIN ALFA 500 MCG/ML IJ SOSY
500.0000 ug | PREFILLED_SYRINGE | Freq: Once | INTRAMUSCULAR | Status: AC
  Administered 2020-08-21: 500 ug via SUBCUTANEOUS

## 2020-08-21 MED ORDER — DARBEPOETIN ALFA 300 MCG/0.6ML IJ SOSY
PREFILLED_SYRINGE | INTRAMUSCULAR | Status: AC
  Filled 2020-08-21: qty 0.6

## 2020-08-21 MED ORDER — DARBEPOETIN ALFA 300 MCG/0.6ML IJ SOSY: 300.0000 ug | PREFILLED_SYRINGE | Freq: Once | INTRAMUSCULAR | Status: DC

## 2020-08-21 MED ORDER — DARBEPOETIN ALFA 500 MCG/ML IJ SOSY
PREFILLED_SYRINGE | INTRAMUSCULAR | Status: AC
  Filled 2020-08-21: qty 1

## 2020-08-21 NOTE — Progress Notes (Signed)
Hematology and Oncology Follow Up Visit  Susan [name] [id number] 1941/02/02 79 y.o. 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  She has also element of chronic disease myelodysplasia diagnosed in 2010.    Current therapy:  Aranesp 300 mcg every 4 weeks to keep her hemoglobin above 11.   Interim History:  Susan [name] is here for a follow-up visit.  Since her last visit, she reports no major changes in her health.  She did receive 2 units of packed red cell transfusion in October which helped her symptoms at this time.  She continues to live independently without any decline in ability to do so.  She denies any recent hospitalization or illnesses.  She denies excessive fatigue tiredness or syncope.       Medications: Reviewed without changes. Current Outpatient Medications  Medication Sig Dispense Refill  . acetaminophen (TYLENOL) 500 MG tablet Take 500-1,000 mg by mouth every 6 (six) hours as needed (for pain.).    [name] allopurinol (ZYLOPRIM) 300 MG tablet Take 300 mg by mouth daily.    [name] amLODipine (NORVASC) 10 MG tablet TAKE 1/2 TABLET BY MOUTH DAILY AT BEDTIME; (TAKE 1 TABLET IF SBP IS >150) (Patient taking differently: Take 10 mg by mouth at bedtime. (TAKE 1 TABLET IF SBP IS >150)) 90 tablet 0  . aspirin 81 MG chewable tablet Chew 81 mg by mouth daily.     . Cholecalciferol (VITAMIN D-3) 5000 UNITS TABS Take 15,000 Units by mouth daily.     . Darbepoetin Alfa (ARANESP, ALBUMIN FREE,) 300 MCG/ML SOLN Inject 300 mcg into the skin See admin instructions. USE INJECTION EVERY 3 WEEKS AS NEEDED FOR HEMOGLOBIN LESS THAN 11.    . diclofenac (VOLTAREN) 75 MG EC tablet TAKE 1 TABLET(75 MG) BY MOUTH TWICE DAILY 180 tablet 0  . diphenhydrAMINE (BENADRYL) 25 mg capsule One capsule every 6 hours x 3 days, then as needed for symptoms of rash/itching 30 capsule 0  . donepezil (ARICEPT) 10 MG tablet Take 10 mg by  mouth daily.    [name] HYDROcodone-acetaminophen (NORCO/VICODIN) 5-325 MG tablet Take 1 tablet by mouth every 6 (six) hours as needed for moderate pain. 20 tablet 0  . hydroxypropyl methylcellulose / hypromellose (ISOPTO TEARS / GONIOVISC) 2.5 % ophthalmic solution Place 1-2 drops into both eyes 3 (three) times daily as needed for dry eyes.    [name] labetalol (NORMODYNE) 200 MG tablet Take 200 mg by mouth 2 (two) times daily.    [name] labetalol (NORMODYNE) 300 MG tablet TAKE 1 TABLET BY MOUTH TWICE DAILY 180 tablet 1  . levothyroxine (SYNTHROID) 100 MCG tablet Take 1 tablet daily on an empty stomach with only water for 30 minutes & no Antacid meds, Calcium or Magnesium for 4 hours & avoid Biotin 90 tablet 3  . Magnesium 500 MG TABS Take 500 mg by mouth every evening.     . memantine (NAMENDA) 10 MG tablet Take 1 tablet (10 mg total) by mouth 2 (two) times daily. 60 tablet 11  . mirtazapine (REMERON) 15 MG tablet Take 15 mg by mouth at bedtime.    . OMEPRAZOLE PO Take 1 tablet by mouth as needed.     . pantoprazole (PROTONIX) 40 MG tablet TK 1 T PO QD FOR ACID REFLUX  0   No current facility-administered medications for this visit.       Physical Exam:  Blood pressure 107/68,  pulse 69, temperature 97.7 F (36.5 C), temperature source Tympanic, resp. rate 16, height 5\' 2"  (1.575 m), weight 129 lb 12.8 oz (58.9 kg), SpO2 100 %.    ECOG: 1    General appearance: Comfortable appearing without any discomfort Head: Normocephalic without any trauma Oropharynx: Mucous membranes are moist and pink without any thrush or ulcers. Eyes: Pupils are equal and round reactive to light. Lymph nodes: No cervical, supraclavicular, inguinal or axillary lymphadenopathy.   Heart:regular rate and rhythm.  S1 and S2 without leg edema. Lung: Clear without any rhonchi or wheezes.  No dullness to percussion. Abdomin: Soft, nontender, nondistended with good bowel sounds.  No hepatosplenomegaly. Musculoskeletal: No joint  deformity or effusion.  Full range of motion noted. Neurological: No deficits noted on motor, sensory and deep tendon reflex exam. Skin: No petechial rash or dryness.  Appeared moist.        Lab Results: Lab Results  Component Value Date   WBC 4.6 07/20/2020   HGB 7.1 (L) 07/20/2020   HCT 21.1 (L) 07/20/2020   MCV 79.3 (L) 07/20/2020   PLT 92 (L) 07/20/2020     Chemistry      Component Value Date/Time   NA 139 07/20/2020 1207   NA 143 06/30/2017 0846   K 3.9 07/20/2020 1207   K 4.4 06/30/2017 0846   CL 113 (H) 07/20/2020 1207   CO2 22 07/20/2020 1207   CO2 24 06/30/2017 0846   BUN 36 (H) 07/20/2020 1207   BUN 26.5 (H) 06/30/2017 0846   CREATININE 1.62 (H) 07/20/2020 1207   CREATININE 1.31 (H) 07/02/2018 0950   CREATININE 1.4 (H) 06/30/2017 0846      Component Value Date/Time   CALCIUM 10.0 07/20/2020 1207   CALCIUM 10.1 06/30/2017 0846   ALKPHOS 114 07/20/2020 1207   ALKPHOS 101 06/30/2017 0846   AST 17 07/20/2020 1207   AST 17 06/30/2017 0846   ALT 10 07/20/2020 1207   ALT <6 06/30/2017 0846   BILITOT 0.4 07/20/2020 1207   BILITOT 0.76 06/30/2017 0846      Impression and Plan:   79 year old woman with:   1.  Anemia diagnosed in 2010.  Related to chronic renal insufficiency as well as chronic disease.   She is currently on Aranesp with a hemoglobin that has been running lower than previously.  Iron studies in June 2021 were within normal range.  Risks and benefits of continuing Aranesp and the role of packed red cell transfusion were discussed.  She is agreeable to proceed with the Aranesp alone.  Her hemoglobin is above 8 and will reserve transfusion unless her hemoglobin drops below that.  2.  Hypertension: Blood pressure is back within normal range currently.  3.  Weight loss: Her weight is overall stable and she is eating better.  4.  Follow-up: She will continue to receive Aranesp every 4 weeks with packed red cell transfusion as needed.  30   minutes were dedicated to this visit.  The time was spent on reviewing disease status, discussing treatment options and future plan of care reviewed.         Zola Button MD 11/19/20219:52 AM

## 2020-08-21 NOTE — Progress Notes (Signed)
MD increasing Aranesp dose to 500 mcg q4 weeks.  Kennith Center, Pharm.D., CPP 08/21/2020@11 :07 AM

## 2020-08-24 ENCOUNTER — Telehealth: Payer: Self-pay | Admitting: Oncology

## 2020-08-24 NOTE — Telephone Encounter (Signed)
Scheduled per 11/19 los. Pt will receive an updated appt calendar per next visit appt note

## 2020-09-18 ENCOUNTER — Inpatient Hospital Stay: Payer: Medicare Other

## 2020-09-18 ENCOUNTER — Other Ambulatory Visit: Payer: Self-pay

## 2020-09-18 ENCOUNTER — Inpatient Hospital Stay: Payer: Medicare Other | Attending: Oncology

## 2020-09-18 ENCOUNTER — Telehealth: Payer: Self-pay

## 2020-09-18 VITALS — BP 123/73 | HR 60 | Resp 18

## 2020-09-18 DIAGNOSIS — N1831 Chronic kidney disease, stage 3a: Secondary | ICD-10-CM

## 2020-09-18 DIAGNOSIS — D631 Anemia in chronic kidney disease: Secondary | ICD-10-CM | POA: Insufficient documentation

## 2020-09-18 DIAGNOSIS — I129 Hypertensive chronic kidney disease with stage 1 through stage 4 chronic kidney disease, or unspecified chronic kidney disease: Secondary | ICD-10-CM | POA: Insufficient documentation

## 2020-09-18 DIAGNOSIS — N189 Chronic kidney disease, unspecified: Secondary | ICD-10-CM

## 2020-09-18 LAB — CMP (CANCER CENTER ONLY)
ALT: 8 U/L (ref 0–44)
AST: 14 U/L — ABNORMAL LOW (ref 15–41)
Albumin: 3.6 g/dL (ref 3.5–5.0)
Alkaline Phosphatase: 118 U/L (ref 38–126)
Anion gap: 5 (ref 5–15)
BUN: 29 mg/dL — ABNORMAL HIGH (ref 8–23)
CO2: 25 mmol/L (ref 22–32)
Calcium: 9.7 mg/dL (ref 8.9–10.3)
Chloride: 111 mmol/L (ref 98–111)
Creatinine: 1.52 mg/dL — ABNORMAL HIGH (ref 0.44–1.00)
GFR, Estimated: 34 mL/min — ABNORMAL LOW (ref >60)
Glucose, Bld: 84 mg/dL (ref 70–99)
Potassium: 4.3 mmol/L (ref 3.5–5.1)
Sodium: 141 mmol/L (ref 135–145)
Total Bilirubin: 0.5 mg/dL (ref 0.3–1.2)
Total Protein: 7.6 g/dL (ref 6.5–8.1)

## 2020-09-18 LAB — FERRITIN: Ferritin: 81 ng/mL (ref 11–307)

## 2020-09-18 LAB — CBC WITH DIFFERENTIAL (CANCER CENTER ONLY)
Abs Immature Granulocytes: 0.01 10*3/uL (ref 0.00–0.07)
Basophils Absolute: 0 10*3/uL (ref 0.0–0.1)
Basophils Relative: 0 %
Eosinophils Absolute: 0.1 10*3/uL (ref 0.0–0.5)
Eosinophils Relative: 2 %
HCT: 25.9 % — ABNORMAL LOW (ref 36.0–46.0)
Hemoglobin: 8.5 g/dL — ABNORMAL LOW (ref 12.0–15.0)
Immature Granulocytes: 0 %
Lymphocytes Relative: 29 %
Lymphs Abs: 1.5 10*3/uL (ref 0.7–4.0)
MCH: 26.8 pg (ref 26.0–34.0)
MCHC: 32.8 g/dL (ref 30.0–36.0)
MCV: 81.7 fL (ref 80.0–100.0)
Monocytes Absolute: 0.4 10*3/uL (ref 0.1–1.0)
Monocytes Relative: 8 %
Neutro Abs: 3.2 10*3/uL (ref 1.7–7.7)
Neutrophils Relative %: 61 %
Platelet Count: 119 10*3/uL — ABNORMAL LOW (ref 150–400)
RBC: 3.17 MIL/uL — ABNORMAL LOW (ref 3.87–5.11)
RDW: 17.3 % — ABNORMAL HIGH (ref 11.5–15.5)
WBC Count: 5.2 10*3/uL (ref 4.0–10.5)
nRBC: 0 % (ref 0.0–0.2)

## 2020-09-18 LAB — IRON AND TIBC
Iron: 47 ug/dL (ref 41–142)
Saturation Ratios: 19 % — ABNORMAL LOW (ref 21–57)
TIBC: 246 ug/dL (ref 236–444)
UIBC: 200 ug/dL (ref 120–384)

## 2020-09-18 LAB — SAMPLE TO BLOOD BANK

## 2020-09-18 MED ORDER — DARBEPOETIN ALFA 500 MCG/ML IJ SOSY
PREFILLED_SYRINGE | INTRAMUSCULAR | Status: AC
  Filled 2020-09-18: qty 1

## 2020-09-18 MED ORDER — DARBEPOETIN ALFA 500 MCG/ML IJ SOSY
500.0000 ug | PREFILLED_SYRINGE | Freq: Once | INTRAMUSCULAR | Status: AC
  Administered 2020-09-18: 12:00:00 500 ug via SUBCUTANEOUS

## 2020-09-18 NOTE — Telephone Encounter (Signed)
Lab called to report hemoglobin is 8.5 today. Dr. Alen Blew made aware. No further instructions received.

## 2020-10-16 ENCOUNTER — Inpatient Hospital Stay (HOSPITAL_BASED_OUTPATIENT_CLINIC_OR_DEPARTMENT_OTHER): Payer: Medicare Other | Admitting: Oncology

## 2020-10-16 ENCOUNTER — Inpatient Hospital Stay: Payer: Medicare Other

## 2020-10-16 ENCOUNTER — Other Ambulatory Visit: Payer: Self-pay

## 2020-10-16 ENCOUNTER — Inpatient Hospital Stay: Payer: Medicare Other | Attending: Oncology

## 2020-10-16 VITALS — BP 113/87 | HR 65 | Temp 97.6°F | Resp 15 | Ht 62.0 in | Wt 132.0 lb

## 2020-10-16 DIAGNOSIS — D696 Thrombocytopenia, unspecified: Secondary | ICD-10-CM | POA: Insufficient documentation

## 2020-10-16 DIAGNOSIS — N189 Chronic kidney disease, unspecified: Secondary | ICD-10-CM

## 2020-10-16 DIAGNOSIS — D631 Anemia in chronic kidney disease: Secondary | ICD-10-CM | POA: Insufficient documentation

## 2020-10-16 DIAGNOSIS — Z79899 Other long term (current) drug therapy: Secondary | ICD-10-CM | POA: Insufficient documentation

## 2020-10-16 DIAGNOSIS — D469 Myelodysplastic syndrome, unspecified: Secondary | ICD-10-CM | POA: Diagnosis not present

## 2020-10-16 DIAGNOSIS — R634 Abnormal weight loss: Secondary | ICD-10-CM | POA: Insufficient documentation

## 2020-10-16 DIAGNOSIS — N1831 Chronic kidney disease, stage 3a: Secondary | ICD-10-CM

## 2020-10-16 LAB — CBC WITH DIFFERENTIAL (CANCER CENTER ONLY)
Abs Immature Granulocytes: 0 10*3/uL (ref 0.00–0.07)
Basophils Absolute: 0 10*3/uL (ref 0.0–0.1)
Basophils Relative: 0 %
Eosinophils Absolute: 0.1 10*3/uL (ref 0.0–0.5)
Eosinophils Relative: 3 %
HCT: 24.8 % — ABNORMAL LOW (ref 36.0–46.0)
Hemoglobin: 8.1 g/dL — ABNORMAL LOW (ref 12.0–15.0)
Immature Granulocytes: 0 %
Lymphocytes Relative: 45 %
Lymphs Abs: 1.4 10*3/uL (ref 0.7–4.0)
MCH: 26.3 pg (ref 26.0–34.0)
MCHC: 32.7 g/dL (ref 30.0–36.0)
MCV: 80.5 fL (ref 80.0–100.0)
Monocytes Absolute: 0.2 10*3/uL (ref 0.1–1.0)
Monocytes Relative: 7 %
Neutro Abs: 1.4 10*3/uL — ABNORMAL LOW (ref 1.7–7.7)
Neutrophils Relative %: 45 %
Platelet Count: 81 10*3/uL — ABNORMAL LOW (ref 150–400)
RBC: 3.08 MIL/uL — ABNORMAL LOW (ref 3.87–5.11)
RDW: 17.2 % — ABNORMAL HIGH (ref 11.5–15.5)
WBC Count: 3.1 10*3/uL — ABNORMAL LOW (ref 4.0–10.5)
nRBC: 0 % (ref 0.0–0.2)

## 2020-10-16 LAB — CMP (CANCER CENTER ONLY)
ALT: 9 U/L (ref 0–44)
AST: 18 U/L (ref 15–41)
Albumin: 3.7 g/dL (ref 3.5–5.0)
Alkaline Phosphatase: 122 U/L (ref 38–126)
Anion gap: 6 (ref 5–15)
BUN: 31 mg/dL — ABNORMAL HIGH (ref 8–23)
CO2: 24 mmol/L (ref 22–32)
Calcium: 9.7 mg/dL (ref 8.9–10.3)
Chloride: 112 mmol/L — ABNORMAL HIGH (ref 98–111)
Creatinine: 1.72 mg/dL — ABNORMAL HIGH (ref 0.44–1.00)
GFR, Estimated: 30 mL/min — ABNORMAL LOW (ref >60)
Glucose, Bld: 82 mg/dL (ref 70–99)
Potassium: 3.9 mmol/L (ref 3.5–5.1)
Sodium: 142 mmol/L (ref 135–145)
Total Bilirubin: 0.4 mg/dL (ref 0.3–1.2)
Total Protein: 7.6 g/dL (ref 6.5–8.1)

## 2020-10-16 LAB — SAMPLE TO BLOOD BANK

## 2020-10-16 MED ORDER — DARBEPOETIN ALFA 500 MCG/ML IJ SOSY
500.0000 ug | PREFILLED_SYRINGE | Freq: Once | INTRAMUSCULAR | Status: AC
  Administered 2020-10-16: 500 ug via SUBCUTANEOUS

## 2020-10-16 MED ORDER — DARBEPOETIN ALFA 500 MCG/ML IJ SOSY
PREFILLED_SYRINGE | INTRAMUSCULAR | Status: AC
  Filled 2020-10-16: qty 1

## 2020-10-16 NOTE — Progress Notes (Signed)
Hematology and Oncology Follow Up Visit  Susan [name] [id number] [birth year] 80 y.o. 10/16/2020 10:15 AM Sonia Side., FNPSmith, [name], [name]   Principle Diagnosis:  80 year old woman with multifactorial anemia diagnosed in 2010.  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   Current therapy:  Aranesp 500 mcg every 4 weeks to keep her hemoglobin above 11.  Therapy has switched from a 300 mcgs starting in August 21, 2020.  Interim History:  Susan [name] returns today for repeat follow-up.  Since her last visit, she reports improvement in her overall health and performance status.  She is eating better at this time and driving short distances.  She denies any shortness of breath, dyspnea exertion, hematochezia or melena.  She denies any recent hospitalization or illnesses.  She denies any falls or syncope.  She is eating better and gained more weight.       Medications: Updated on review. Current Outpatient Medications  Medication Sig Dispense Refill  . acetaminophen (TYLENOL) 500 MG tablet Take 500-1,000 mg by mouth every 6 (six) hours as needed (for pain.).    [name] allopurinol (ZYLOPRIM) 300 MG tablet Take 300 mg by mouth daily.    [name] amLODipine (NORVASC) 10 MG tablet TAKE 1/2 TABLET BY MOUTH DAILY AT BEDTIME; (TAKE 1 TABLET IF SBP IS >150) (Patient taking differently: Take 10 mg by mouth at bedtime. (TAKE 1 TABLET IF SBP IS >150)) 90 tablet 0  . aspirin 81 MG chewable tablet Chew 81 mg by mouth daily.     . Cholecalciferol (VITAMIN D-3) 5000 UNITS TABS Take 15,000 Units by mouth daily.     . Darbepoetin Alfa (ARANESP, ALBUMIN FREE,) 300 MCG/ML SOLN Inject 300 mcg into the skin See admin instructions. USE INJECTION EVERY 3 WEEKS AS NEEDED FOR HEMOGLOBIN LESS THAN 11.    . diclofenac (VOLTAREN) 75 MG EC tablet TAKE 1 TABLET(75 MG) BY MOUTH TWICE DAILY 180 tablet 0  . diphenhydrAMINE (BENADRYL) 25 mg capsule One capsule every 6 hours x 3 days, then as needed for  symptoms of rash/itching 30 capsule 0  . donepezil (ARICEPT) 10 MG tablet Take 10 mg by mouth daily.    [name] HYDROcodone-acetaminophen (NORCO/VICODIN) 5-325 MG tablet Take 1 tablet by mouth every 6 (six) hours as needed for moderate pain. 20 tablet 0  . hydroxypropyl methylcellulose / hypromellose (ISOPTO TEARS / GONIOVISC) 2.5 % ophthalmic solution Place 1-2 drops into both eyes 3 (three) times daily as needed for dry eyes.    [name] labetalol (NORMODYNE) 200 MG tablet Take 200 mg by mouth 2 (two) times daily.    [name] labetalol (NORMODYNE) 300 MG tablet TAKE 1 TABLET BY MOUTH TWICE DAILY 180 tablet 1  . levothyroxine (SYNTHROID) 100 MCG tablet Take 1 tablet daily on an empty stomach with only water for 30 minutes & no Antacid meds, Calcium or Magnesium for 4 hours & avoid Biotin 90 tablet 3  . Magnesium 500 MG TABS Take 500 mg by mouth every evening.     . memantine (NAMENDA) 10 MG tablet Take 1 tablet (10 mg total) by mouth 2 (two) times daily. 60 tablet 11  . mirtazapine (REMERON) 15 MG tablet Take 15 mg by mouth at bedtime.    . OMEPRAZOLE PO Take 1 tablet by mouth as needed.     . pantoprazole (PROTONIX) 40 MG tablet TK 1 T PO QD FOR ACID REFLUX  0   No current facility-administered medications for this visit.  Physical Exam:  Blood pressure 113/87, pulse 65, temperature 97.6 F (36.4 C), temperature source Tympanic, resp. rate 15, height 5' 2" (1.575 m), weight 132 lb (59.9 kg), SpO2 100 %.    ECOG: 1   General appearance: Alert, awake without any distress. Head: Atraumatic without abnormalities Oropharynx: Without any thrush or ulcers. Eyes: No scleral icterus. Lymph nodes: No lymphadenopathy noted in the cervical, supraclavicular, or axillary nodes Heart:regular rate and rhythm, without any murmurs or gallops.   Lung: Clear to auscultation without any rhonchi, wheezes or dullness to percussion. Abdomin: Soft, nontender without any shifting dullness or ascites. Musculoskeletal:  No clubbing or cyanosis. Neurological: No motor or sensory deficits. Skin: No rashes or lesions.       Lab Results: Lab Results  Component Value Date   WBC 3.1 (L) 10/16/2020   HGB 8.1 (L) 10/16/2020   HCT 24.8 (L) 10/16/2020   MCV 80.5 10/16/2020   PLT 81 (L) 10/16/2020     Chemistry      Component Value Date/Time   NA 141 09/18/2020 1053   NA 143 06/30/2017 0846   K 4.3 09/18/2020 1053   K 4.4 06/30/2017 0846   CL 111 09/18/2020 1053   CO2 25 09/18/2020 1053   CO2 24 06/30/2017 0846   BUN 29 (H) 09/18/2020 1053   BUN 26.5 (H) 06/30/2017 0846   CREATININE 1.52 (H) 09/18/2020 1053   CREATININE 1.31 (H) 07/02/2018 0950   CREATININE 1.4 (H) 06/30/2017 0846      Component Value Date/Time   CALCIUM 9.7 09/18/2020 1053   CALCIUM 10.1 06/30/2017 0846   ALKPHOS 118 09/18/2020 1053   ALKPHOS 101 06/30/2017 0846   AST 14 (L) 09/18/2020 1053   AST 17 06/30/2017 0846   ALT 8 09/18/2020 1053   ALT <6 06/30/2017 0846   BILITOT 0.5 09/18/2020 1053   BILITOT 0.76 06/30/2017 0846      Impression and Plan:   80 year old woman with:   1.  Multifactorial anemia diagnosed in 2010.  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  She is currently on Aranesp 500 mcg with slight improvement in her hemoglobin currently around 8.1.  Risks and benefits of continuing growth factor support were discussed at this time.  The role for additional transfusion were reviewed and at this time not indicated.  Increase the frequency of Aranesp is also a possibility if further drop is noted.  Iron studies in December 2021 showed adequate storage but slightly declining.  Iron replacement may be required.  We will continue to monitor.  2.  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  She has also element of neutropenia as well and a bone marrow biopsy may be indicated in the future if further drop is noted.  He would not be a candidate  for any aggressive measures for MDS other than supportive management.  This would not alter the management approach.  3.  Weight loss: She gained 2 pounds since the last visit and eating better.  4.  Follow-up: Every 4 weeks for Aranesp and MD follow-up in 4 months.  30  minutes were spent on this encounter.  The time was dedicated to reviewing laboratory data, disease status update and outlining future plan of care.         Zola Button MD 1/14/202210:15 AM

## 2020-10-16 NOTE — Patient Instructions (Signed)
Darbepoetin Alfa injection What is this medicine? DARBEPOETIN ALFA (dar be POE e tin AL fa) helps your body make more red blood cells. It is used to treat anemia caused by chronic kidney failure and chemotherapy. This medicine may be used for other purposes; ask your health care provider or pharmacist if you have questions. COMMON BRAND NAME(S): Aranesp What should I tell my health care provider before I take this medicine? They need to know if you have any of these conditions:  blood clotting disorders or history of blood clots  cancer patient not on chemotherapy  cystic fibrosis  heart disease, such as angina, heart failure, or a history of a heart attack  hemoglobin level of 12 g/dL or greater  high blood pressure  low levels of folate, iron, or vitamin B12  seizures  an unusual or allergic reaction to darbepoetin, erythropoietin, albumin, hamster proteins, latex, other medicines, foods, dyes, or preservatives  pregnant or trying to get pregnant  breast-feeding How should I use this medicine? This medicine is for injection into a vein or under the skin. It is usually given by a health care professional in a hospital or clinic setting. If you get this medicine at home, you will be taught how to prepare and give this medicine. Use exactly as directed. Take your medicine at regular intervals. Do not take your medicine more often than directed. It is important that you put your used needles and syringes in a special sharps container. Do not put them in a trash can. If you do not have a sharps container, call your pharmacist or healthcare provider to get one. A special MedGuide will be given to you by the pharmacist with each prescription and refill. Be sure to read this information carefully each time. Talk to your pediatrician regarding the use of this medicine in children. While this medicine may be used in children as young as 1 month of age for selected conditions, precautions do  apply. Overdosage: If you think you have taken too much of this medicine contact a poison control center or emergency room at once. NOTE: This medicine is only for you. Do not share this medicine with others. What if I miss a dose? If you miss a dose, take it as soon as you can. If it is almost time for your next dose, take only that dose. Do not take double or extra doses. What may interact with this medicine? Do not take this medicine with any of the following medications:  epoetin alfa This list may not describe all possible interactions. Give your health care provider a list of all the medicines, herbs, non-prescription drugs, or dietary supplements you use. Also tell them if you smoke, drink alcohol, or use illegal drugs. Some items may interact with your medicine. What should I watch for while using this medicine? Your condition will be monitored carefully while you are receiving this medicine. You may need blood work done while you are taking this medicine. This medicine may cause a decrease in vitamin B6. You should make sure that you get enough vitamin B6 while you are taking this medicine. Discuss the foods you eat and the vitamins you take with your health care professional. What side effects may I notice from receiving this medicine? Side effects that you should report to your doctor or health care professional as soon as possible:  allergic reactions like skin rash, itching or hives, swelling of the face, lips, or tongue  breathing problems  changes in   vision  chest pain  confusion, trouble speaking or understanding  feeling faint or lightheaded, falls  high blood pressure  muscle aches or pains  pain, swelling, warmth in the leg  rapid weight gain  severe headaches  sudden numbness or weakness of the face, arm or leg  trouble walking, dizziness, loss of balance or coordination  seizures (convulsions)  swelling of the ankles, feet, hands  unusually weak or  tired Side effects that usually do not require medical attention (report to your doctor or health care professional if they continue or are bothersome):  diarrhea  fever, chills (flu-like symptoms)  headaches  nausea, vomiting  redness, stinging, or swelling at site where injected This list may not describe all possible side effects. Call your doctor for medical advice about side effects. You may report side effects to FDA at 1-800-FDA-1088. Where should I keep my medicine? Keep out of the reach of children. Store in a refrigerator between 2 and 8 degrees C (36 and 46 degrees F). Do not freeze. Do not shake. Throw away any unused portion if using a single-dose vial. Throw away any unused medicine after the expiration date. NOTE: This sheet is a summary. It may not cover all possible information. If you have questions about this medicine, talk to your doctor, pharmacist, or health care provider.  2021 Elsevier/Gold Standard (2017-10-04 16:44:20)  

## 2020-10-27 ENCOUNTER — Ambulatory Visit
Admission: RE | Admit: 2020-10-27 | Discharge: 2020-10-27 | Disposition: A | Discharge status: Home / Self Care | Payer: Medicare Other | Source: Ambulatory Visit | Attending: Family | Admitting: Family

## 2020-10-27 ENCOUNTER — Other Ambulatory Visit: Payer: Self-pay | Admitting: Family

## 2020-10-27 DIAGNOSIS — G8929 Other chronic pain: Secondary | ICD-10-CM

## 2020-10-27 DIAGNOSIS — M25512 Pain in left shoulder: Secondary | ICD-10-CM

## 2020-11-10 ENCOUNTER — Telehealth: Payer: Self-pay

## 2020-11-10 NOTE — Telephone Encounter (Signed)
Called and informed patient of Dr. Hazeline Junker response below. Patient verbalized understanding.  Reminded patient of lab and injection appointment on 11/13/20 at 10am.

## 2020-11-10 NOTE — Telephone Encounter (Signed)
-----   Message from Wyatt Portela, MD sent at 11/10/2020 10:35 AM EST ----- Regarding: RE: Patient Concern Please keep it as is. Thanks ----- Message ----- From: Teodoro Spray, RN Sent: 11/10/2020  10:20 AM EST To: Wyatt Portela, MD Subject: Patient Concern                                Patient called stating she was seen at her PCP yesterday and received a call today stating she her blood levels were low and she needed a blood transfusion. Patient no complaints of new SOB or increased tiredness, and was unable to state what her blood level was. Patient is scheduled for lab/aranesp injection on 11/13/20.  Would you like patient to have patient come to office sooner or is current appointment date ok?   Please advise.

## 2020-11-13 ENCOUNTER — Other Ambulatory Visit: Payer: Self-pay

## 2020-11-13 ENCOUNTER — Inpatient Hospital Stay: Payer: Medicare Other | Attending: Oncology

## 2020-11-13 ENCOUNTER — Inpatient Hospital Stay: Payer: Medicare Other

## 2020-11-13 VITALS — BP 123/69 | HR 61 | Temp 97.9°F | Resp 18

## 2020-11-13 DIAGNOSIS — D631 Anemia in chronic kidney disease: Secondary | ICD-10-CM | POA: Insufficient documentation

## 2020-11-13 DIAGNOSIS — N189 Chronic kidney disease, unspecified: Secondary | ICD-10-CM

## 2020-11-13 DIAGNOSIS — D696 Thrombocytopenia, unspecified: Secondary | ICD-10-CM | POA: Insufficient documentation

## 2020-11-13 DIAGNOSIS — Z79899 Other long term (current) drug therapy: Secondary | ICD-10-CM | POA: Insufficient documentation

## 2020-11-13 DIAGNOSIS — N1831 Chronic kidney disease, stage 3a: Secondary | ICD-10-CM | POA: Insufficient documentation

## 2020-11-13 LAB — CBC WITH DIFFERENTIAL (CANCER CENTER ONLY)
Abs Immature Granulocytes: 0.01 10*3/uL (ref 0.00–0.07)
Basophils Absolute: 0 10*3/uL (ref 0.0–0.1)
Basophils Relative: 0 %
Eosinophils Absolute: 0.1 10*3/uL (ref 0.0–0.5)
Eosinophils Relative: 3 %
HCT: 21.9 % — ABNORMAL LOW (ref 36.0–46.0)
Hemoglobin: 7.1 g/dL — ABNORMAL LOW (ref 12.0–15.0)
Immature Granulocytes: 0 %
Lymphocytes Relative: 39 %
Lymphs Abs: 1.5 10*3/uL (ref 0.7–4.0)
MCH: 24.7 pg — ABNORMAL LOW (ref 26.0–34.0)
MCHC: 32.4 g/dL (ref 30.0–36.0)
MCV: 76.3 fL — ABNORMAL LOW (ref 80.0–100.0)
Monocytes Absolute: 0.2 10*3/uL (ref 0.1–1.0)
Monocytes Relative: 6 %
Neutro Abs: 2.1 10*3/uL (ref 1.7–7.7)
Neutrophils Relative %: 52 %
Platelet Count: 107 10*3/uL — ABNORMAL LOW (ref 150–400)
RBC: 2.87 MIL/uL — ABNORMAL LOW (ref 3.87–5.11)
RDW: 18.4 % — ABNORMAL HIGH (ref 11.5–15.5)
WBC Count: 4 10*3/uL (ref 4.0–10.5)
nRBC: 0 % (ref 0.0–0.2)

## 2020-11-13 LAB — CMP (CANCER CENTER ONLY)
ALT: 9 U/L (ref 0–44)
AST: 18 U/L (ref 15–41)
Albumin: 3.9 g/dL (ref 3.5–5.0)
Alkaline Phosphatase: 132 U/L — ABNORMAL HIGH (ref 38–126)
Anion gap: 6 (ref 5–15)
BUN: 33 mg/dL — ABNORMAL HIGH (ref 8–23)
CO2: 24 mmol/L (ref 22–32)
Calcium: 9.7 mg/dL (ref 8.9–10.3)
Chloride: 111 mmol/L (ref 98–111)
Creatinine: 1.69 mg/dL — ABNORMAL HIGH (ref 0.44–1.00)
GFR, Estimated: 30 mL/min — ABNORMAL LOW (ref >60)
Glucose, Bld: 87 mg/dL (ref 70–99)
Potassium: 4.2 mmol/L (ref 3.5–5.1)
Sodium: 141 mmol/L (ref 135–145)
Total Bilirubin: 0.3 mg/dL (ref 0.3–1.2)
Total Protein: 7.6 g/dL (ref 6.5–8.1)

## 2020-11-13 LAB — SAMPLE TO BLOOD BANK

## 2020-11-13 LAB — PREPARE RBC (CROSSMATCH)

## 2020-11-13 MED ORDER — DARBEPOETIN ALFA 500 MCG/ML IJ SOSY
PREFILLED_SYRINGE | INTRAMUSCULAR | Status: AC
  Filled 2020-11-13: qty 1

## 2020-11-13 MED ORDER — DARBEPOETIN ALFA 500 MCG/ML IJ SOSY
500.0000 ug | PREFILLED_SYRINGE | Freq: Once | INTRAMUSCULAR | Status: AC
  Administered 2020-11-13: 500 ug via SUBCUTANEOUS

## 2020-11-13 NOTE — Progress Notes (Signed)
Per Dr. Alen Blew patient to receive one unit PRBC's next week. Patient scheduled for Monday 11/16/20 at 2:00. Patient is aware of appointment day and time and not to remove blue blood bank bracelet.

## 2020-11-13 NOTE — Patient Instructions (Signed)
Darbepoetin Alfa injection What is this medicine? DARBEPOETIN ALFA (dar be POE e tin AL fa) helps your body make more red blood cells. It is used to treat anemia caused by chronic kidney failure and chemotherapy. This medicine may be used for other purposes; ask your health care provider or pharmacist if you have questions. COMMON BRAND NAME(S): Aranesp What should I tell my health care provider before I take this medicine? They need to know if you have any of these conditions:  blood clotting disorders or history of blood clots  cancer patient not on chemotherapy  cystic fibrosis  heart disease, such as angina, heart failure, or a history of a heart attack  hemoglobin level of 12 g/dL or greater  high blood pressure  low levels of folate, iron, or vitamin B12  seizures  an unusual or allergic reaction to darbepoetin, erythropoietin, albumin, hamster proteins, latex, other medicines, foods, dyes, or preservatives  pregnant or trying to get pregnant  breast-feeding How should I use this medicine? This medicine is for injection into a vein or under the skin. It is usually given by a health care professional in a hospital or clinic setting. If you get this medicine at home, you will be taught how to prepare and give this medicine. Use exactly as directed. Take your medicine at regular intervals. Do not take your medicine more often than directed. It is important that you put your used needles and syringes in a special sharps container. Do not put them in a trash can. If you do not have a sharps container, call your pharmacist or healthcare provider to get one. A special MedGuide will be given to you by the pharmacist with each prescription and refill. Be sure to read this information carefully each time. Talk to your pediatrician regarding the use of this medicine in children. While this medicine may be used in children as young as 1 month of age for selected conditions, precautions do  apply. Overdosage: If you think you have taken too much of this medicine contact a poison control center or emergency room at once. NOTE: This medicine is only for you. Do not share this medicine with others. What if I miss a dose? If you miss a dose, take it as soon as you can. If it is almost time for your next dose, take only that dose. Do not take double or extra doses. What may interact with this medicine? Do not take this medicine with any of the following medications:  epoetin alfa This list may not describe all possible interactions. Give your health care provider a list of all the medicines, herbs, non-prescription drugs, or dietary supplements you use. Also tell them if you smoke, drink alcohol, or use illegal drugs. Some items may interact with your medicine. What should I watch for while using this medicine? Your condition will be monitored carefully while you are receiving this medicine. You may need blood work done while you are taking this medicine. This medicine may cause a decrease in vitamin B6. You should make sure that you get enough vitamin B6 while you are taking this medicine. Discuss the foods you eat and the vitamins you take with your health care professional. What side effects may I notice from receiving this medicine? Side effects that you should report to your doctor or health care professional as soon as possible:  allergic reactions like skin rash, itching or hives, swelling of the face, lips, or tongue  breathing problems  changes in   vision  chest pain  confusion, trouble speaking or understanding  feeling faint or lightheaded, falls  high blood pressure  muscle aches or pains  pain, swelling, warmth in the leg  rapid weight gain  severe headaches  sudden numbness or weakness of the face, arm or leg  trouble walking, dizziness, loss of balance or coordination  seizures (convulsions)  swelling of the ankles, feet, hands  unusually weak or  tired Side effects that usually do not require medical attention (report to your doctor or health care professional if they continue or are bothersome):  diarrhea  fever, chills (flu-like symptoms)  headaches  nausea, vomiting  redness, stinging, or swelling at site where injected This list may not describe all possible side effects. Call your doctor for medical advice about side effects. You may report side effects to FDA at 1-800-FDA-1088. Where should I keep my medicine? Keep out of the reach of children. Store in a refrigerator between 2 and 8 degrees C (36 and 46 degrees F). Do not freeze. Do not shake. Throw away any unused portion if using a single-dose vial. Throw away any unused medicine after the expiration date. NOTE: This sheet is a summary. It may not cover all possible information. If you have questions about this medicine, talk to your doctor, pharmacist, or health care provider.  2021 Elsevier/Gold Standard (2017-10-04 16:44:20)  

## 2020-11-16 ENCOUNTER — Other Ambulatory Visit: Payer: Self-pay

## 2020-11-16 ENCOUNTER — Inpatient Hospital Stay: Payer: Medicare Other

## 2020-11-16 DIAGNOSIS — N1831 Chronic kidney disease, stage 3a: Secondary | ICD-10-CM | POA: Diagnosis not present

## 2020-11-16 DIAGNOSIS — D631 Anemia in chronic kidney disease: Secondary | ICD-10-CM

## 2020-11-16 DIAGNOSIS — N189 Chronic kidney disease, unspecified: Secondary | ICD-10-CM

## 2020-11-16 MED ORDER — ACETAMINOPHEN 325 MG PO TABS
650.0000 mg | ORAL_TABLET | Freq: Once | ORAL | Status: AC
  Administered 2020-11-16: 650 mg via ORAL

## 2020-11-16 MED ORDER — DIPHENHYDRAMINE HCL 25 MG PO CAPS
25.0000 mg | ORAL_CAPSULE | Freq: Once | ORAL | Status: AC
  Administered 2020-11-16: 25 mg via ORAL

## 2020-11-16 MED ORDER — DIPHENHYDRAMINE HCL 25 MG PO CAPS
ORAL_CAPSULE | ORAL | Status: AC
  Filled 2020-11-16: qty 1

## 2020-11-16 MED ORDER — SODIUM CHLORIDE 0.9% IV SOLUTION
250.0000 mL | Freq: Once | INTRAVENOUS | Status: AC
  Administered 2020-11-16: 250 mL via INTRAVENOUS
  Filled 2020-11-16: qty 250

## 2020-11-16 MED ORDER — ACETAMINOPHEN 325 MG PO TABS
ORAL_TABLET | ORAL | Status: AC
  Filled 2020-11-16: qty 2

## 2020-11-16 NOTE — Patient Instructions (Signed)
Blood Transfusion, Adult °A blood transfusion is a procedure in which you receive blood through an IV tube. You may need this procedure because of: °A bleeding disorder. °An illness. °An injury. °A surgery. °The blood may come from someone else (a donor). You may also be able to donate blood for yourself. The blood given in a transfusion is made up of different types of cells. You may get: °Red blood cells. These carry oxygen to the cells in the body. °White blood cells. These help you fight infections. °Platelets. These help your blood to clot. °Plasma. This is the liquid part of your blood. It carries proteins and other substances through the body. °If you have a clotting disorder, you may also get other types of blood products. °Tell your doctor about: °Any blood disorders you have. °Any reactions you have had during a blood transfusion in the past. °Any allergies you have. °All medicines you are taking, including vitamins, herbs, eye drops, creams, and over-the-counter medicines. °Any surgeries you have had. °Any medical conditions you have. This includes any recent fever or cold symptoms. °Whether you are pregnant or may be pregnant. °What are the risks? °Generally, this is a safe procedure. However, problems may occur. °The most common problems include: °A mild allergic reaction. This includes red, swollen areas of skin (hives) and itching. °Fever or chills. This may be the body's response to new blood cells received. This may happen during or up to 4 hours after the transfusion. °More serious problems may include: °Too much fluid in the lungs. This may cause breathing problems. °A serious allergic reaction. This includes breathing trouble or swelling around the face and lips. °Lung injury. This causes breathing trouble and low oxygen in the blood. This can happen within hours of the transfusion or days later. °Too much iron. This can happen after getting many blood transfusions over a period of time. °An  infection or virus passed through the blood. This is rare. Donated blood is carefully tested before it is given. °Your body's defense system (immune system) trying to attack the new blood cells. This is rare. Symptoms may include fever, chills, nausea, low blood pressure, and low back or chest pain. °Donated cells attacking healthy tissues. This is rare. °What happens before the procedure? °Medicines °Ask your doctor about: °Changing or stopping your normal medicines. This is important. °Taking aspirin and ibuprofen. Do not take these medicines unless your doctor tells you to take them. °Taking over-the-counter medicines, vitamins, herbs, and supplements. °General instructions °Follow instructions from your doctor about what you cannot eat or drink. °You will have a blood test to find out your blood type. The test also finds out what type of blood your body will accept and matches it to the donor type. °If you are going to have a planned surgery, you may be able to donate your own blood. This may be done in case you need a transfusion. °You will have your temperature, blood pressure, and pulse checked. °You may receive medicine to help prevent an allergic reaction. This may be done if you have had a reaction to a transfusion before. This medicine may be given to you by mouth or through an IV tube. °This procedure lasts about 1-4 hours. Plan for the time you need. °What happens during the procedure? °An IV tube will be put into one of your veins. °The bag of donated blood will be attached to your IV tube. Then, the blood will enter through your vein. °Your temperature, blood   pressure, and pulse will be checked often. This is done to find early signs of a transfusion reaction. °Tell your nurse right away if you have any of these symptoms: °Shortness of breath or trouble breathing. °Chest or back pain. °Fever or chills. °Red, swollen areas of skin or itching. °If you have any signs or symptoms of a reaction, your  transfusion will be stopped. You may also be given medicine. °When the transfusion is finished, your IV tube will be taken out. °Pressure may be put on the IV site for a few minutes. °A bandage (dressing) will be put on the IV site. °The procedure may vary among doctors and hospitals.   °What happens after the procedure? °You will be monitored until you leave the hospital or clinic. This includes checking your temperature, blood pressure, pulse, breathing rate, and blood oxygen level. °Your blood may be tested to see how you are responding to the transfusion. °You may be warmed with fluids or blankets. This is done to keep the temperature of your body normal. °If you have your procedure in an outpatient setting, you will be told whom to contact to report any reactions. °Where to find more information °To learn more, visit the [employer]: redcross.org °Summary °A blood transfusion is a procedure in which you are given blood through an IV tube. °The blood may come from someone else (a donor). You may also be able to donate blood for yourself. °The blood you are given is made up of different blood cells. You may receive red blood cells, platelets, plasma, or white blood cells. °Your temperature, blood pressure, and pulse will be checked often. °After the procedure, your blood may be tested to see how you are responding. °This information is not intended to replace advice given to you by your health care provider. Make sure you discuss any questions you have with your health care provider. °Document Revised: 03/14/2019 Document Reviewed: 03/14/2019 °Elsevier Patient Education © 2021 [employer] ° °

## 2020-11-17 ENCOUNTER — Ambulatory Visit: Payer: Medicare Other | Admitting: Podiatry

## 2020-11-17 LAB — TYPE AND SCREEN
ABO/RH(D): O POS
Antibody Screen: NEGATIVE
Unit division: 0

## 2020-11-17 LAB — BPAM RBC
Blood Product Expiration Date: 202203152359
ISSUE DATE / TIME: 202202141448
Unit Type and Rh: 5100

## 2020-11-30 ENCOUNTER — Ambulatory Visit (INDEPENDENT_AMBULATORY_CARE_PROVIDER_SITE_OTHER): Payer: Medicare Other | Admitting: Neurology

## 2020-11-30 ENCOUNTER — Other Ambulatory Visit: Payer: Self-pay

## 2020-11-30 ENCOUNTER — Encounter: Payer: Self-pay | Admitting: Neurology

## 2020-11-30 ENCOUNTER — Ambulatory Visit (INDEPENDENT_AMBULATORY_CARE_PROVIDER_SITE_OTHER): Payer: Medicare Other | Admitting: Podiatry

## 2020-11-30 VITALS — BP 100/64 | HR 64 | Ht 62.0 in | Wt 132.0 lb

## 2020-11-30 DIAGNOSIS — I679 Cerebrovascular disease, unspecified: Secondary | ICD-10-CM | POA: Diagnosis not present

## 2020-11-30 DIAGNOSIS — F0390 Unspecified dementia without behavioral disturbance: Secondary | ICD-10-CM | POA: Diagnosis not present

## 2020-11-30 DIAGNOSIS — B351 Tinea unguium: Secondary | ICD-10-CM

## 2020-11-30 DIAGNOSIS — Q828 Other specified congenital malformations of skin: Secondary | ICD-10-CM

## 2020-11-30 DIAGNOSIS — M79674 Pain in right toe(s): Secondary | ICD-10-CM

## 2020-11-30 DIAGNOSIS — M79675 Pain in left toe(s): Secondary | ICD-10-CM

## 2020-11-30 MED ORDER — MEMANTINE HCL 10 MG PO TABS: 10.0000 mg | ORAL_TABLET | Freq: Two times a day (BID) | ORAL | 11 refills | Status: DC
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
## 2020-11-30 NOTE — Patient Instructions (Signed)
Continue memory medications stay on aspirin 81 mg daily Continue to see your primary doctor for management of vascular risk factors Try to exercise, drink plenty of water See you back in 1 year

## 2020-12-02 NOTE — Progress Notes (Signed)
Subjective: 80 y.o. returns the office today for painful, elongated, thickened toenails which she cannot trim herself and for calluses. Denies any redness or drainage around the nails. Denies any acute changes since last appointment and no new complaints today. Denies any systemic complaints such as fevers, chills, nausea, vomiting.   PCP: Sonia Side., [name]  Objective: AAO 3, NAD DP/PT pulses palpable, CRT less than 3 seconds Nails hypertrophic, dystrophic, elongated, brittle, discolored 10. There is tenderness overlying the nails 1-5 bilaterally. There is no surrounding erythema or drainage along the nail sites. Hyperkeratotic lesions present submetatarsal area bilaterally. No underlying ulceration drainage or signs of infection. No open lesions or pre-ulcerative lesions are identified. No pain with calf compression, swelling, warmth, erythema.  Assessment: Patient presents with symptomatic onychomycosis, hyperkeratotic lesions  Plan: -Treatment options including alternatives, risks, complications were discussed -Nails sharply debrided 10 without complication/bleeding. -Hyperkeratotic lesion sharply debrided x2 without any complications or bleeding today. -Discussed daily foot inspection. If there are any changes, to call the office immediately.  -Follow-up in 3 months or sooner if any problems are to arise. In the meantime, encouraged to call the office with any questions, concerns, changes symptoms.  Celesta Gentile, DPM

## 2020-12-11 ENCOUNTER — Telehealth: Payer: Self-pay | Admitting: Oncology

## 2020-12-11 ENCOUNTER — Inpatient Hospital Stay: Payer: Medicare Other

## 2020-12-11 ENCOUNTER — Other Ambulatory Visit: Payer: Self-pay

## 2020-12-11 ENCOUNTER — Inpatient Hospital Stay: Payer: Medicare Other | Attending: Oncology

## 2020-12-11 VITALS — BP 102/61 | HR 65 | Resp 18

## 2020-12-11 DIAGNOSIS — D631 Anemia in chronic kidney disease: Secondary | ICD-10-CM

## 2020-12-11 DIAGNOSIS — N1831 Chronic kidney disease, stage 3a: Secondary | ICD-10-CM

## 2020-12-11 DIAGNOSIS — N189 Chronic kidney disease, unspecified: Secondary | ICD-10-CM

## 2020-12-11 LAB — CBC WITH DIFFERENTIAL (CANCER CENTER ONLY)
Abs Immature Granulocytes: 0.02 10*3/uL (ref 0.00–0.07)
Basophils Absolute: 0 10*3/uL (ref 0.0–0.1)
Basophils Relative: 0 %
Eosinophils Absolute: 0.1 10*3/uL (ref 0.0–0.5)
Eosinophils Relative: 2 %
HCT: 21.2 % — ABNORMAL LOW (ref 36.0–46.0)
Hemoglobin: 6.9 g/dL — CL (ref 12.0–15.0)
Immature Granulocytes: 0 %
Lymphocytes Relative: 30 %
Lymphs Abs: 1.3 10*3/uL (ref 0.7–4.0)
MCH: 24.6 pg — ABNORMAL LOW (ref 26.0–34.0)
MCHC: 32.5 g/dL (ref 30.0–36.0)
MCV: 75.4 fL — ABNORMAL LOW (ref 80.0–100.0)
Monocytes Absolute: 0.5 10*3/uL (ref 0.1–1.0)
Monocytes Relative: 10 %
Neutro Abs: 2.6 10*3/uL (ref 1.7–7.7)
Neutrophils Relative %: 58 %
Platelet Count: 106 10*3/uL — ABNORMAL LOW (ref 150–400)
RBC: 2.81 MIL/uL — ABNORMAL LOW (ref 3.87–5.11)
RDW: 20.8 % — ABNORMAL HIGH (ref 11.5–15.5)
WBC Count: 4.5 10*3/uL (ref 4.0–10.5)
nRBC: 0 % (ref 0.0–0.2)

## 2020-12-11 LAB — CMP (CANCER CENTER ONLY)
ALT: 8 U/L (ref 0–44)
AST: 15 U/L (ref 15–41)
Albumin: 3.7 g/dL (ref 3.5–5.0)
Alkaline Phosphatase: 124 U/L (ref 38–126)
Anion gap: 5 (ref 5–15)
BUN: 30 mg/dL — ABNORMAL HIGH (ref 8–23)
CO2: 23 mmol/L (ref 22–32)
Calcium: 9.3 mg/dL (ref 8.9–10.3)
Chloride: 113 mmol/L — ABNORMAL HIGH (ref 98–111)
Creatinine: 1.52 mg/dL — ABNORMAL HIGH (ref 0.44–1.00)
GFR, Estimated: 34 mL/min — ABNORMAL LOW (ref >60)
Glucose, Bld: 90 mg/dL (ref 70–99)
Potassium: 3.7 mmol/L (ref 3.5–5.1)
Sodium: 141 mmol/L (ref 135–145)
Total Bilirubin: 0.3 mg/dL (ref 0.3–1.2)
Total Protein: 7.2 g/dL (ref 6.5–8.1)

## 2020-12-11 LAB — SAMPLE TO BLOOD BANK

## 2020-12-11 LAB — PREPARE RBC (CROSSMATCH)

## 2020-12-11 MED ORDER — DARBEPOETIN ALFA 500 MCG/ML IJ SOSY
500.0000 ug | PREFILLED_SYRINGE | Freq: Once | INTRAMUSCULAR | Status: AC
  Administered 2020-12-11: 500 ug via SUBCUTANEOUS

## 2020-12-11 MED ORDER — DARBEPOETIN ALFA 500 MCG/ML IJ SOSY
PREFILLED_SYRINGE | INTRAMUSCULAR | Status: AC
  Filled 2020-12-11: qty 1

## 2020-12-11 NOTE — Progress Notes (Signed)
Patient denies any SOB, heart palpations or fatigue at this time. Appt scheduled for blood transfusion

## 2020-12-11 NOTE — Progress Notes (Signed)
Spoke to Susan [name] in blood bank to confirm orders received for Saturday 12/12/20. Patient is aware not to remove blue blood bank bracelet.

## 2020-12-11 NOTE — Patient Instructions (Signed)
Darbepoetin Alfa injection What is this medicine? DARBEPOETIN ALFA (dar be POE e tin AL fa) helps your body make more red blood cells. It is used to treat anemia caused by chronic kidney failure and chemotherapy. This medicine may be used for other purposes; ask your health care provider or pharmacist if you have questions. COMMON BRAND NAME(S): Aranesp What should I tell my health care provider before I take this medicine? They need to know if you have any of these conditions:  blood clotting disorders or history of blood clots  cancer patient not on chemotherapy  cystic fibrosis  heart disease, such as angina, heart failure, or a history of a heart attack  hemoglobin level of 12 g/dL or greater  high blood pressure  low levels of folate, iron, or vitamin B12  seizures  an unusual or allergic reaction to darbepoetin, erythropoietin, albumin, hamster proteins, latex, other medicines, foods, dyes, or preservatives  pregnant or trying to get pregnant  breast-feeding How should I use this medicine? This medicine is for injection into a vein or under the skin. It is usually given by a health care professional in a hospital or clinic setting. If you get this medicine at home, you will be taught how to prepare and give this medicine. Use exactly as directed. Take your medicine at regular intervals. Do not take your medicine more often than directed. It is important that you put your used needles and syringes in a special sharps container. Do not put them in a trash can. If you do not have a sharps container, call your pharmacist or healthcare provider to get one. A special MedGuide will be given to you by the pharmacist with each prescription and refill. Be sure to read this information carefully each time. Talk to your pediatrician regarding the use of this medicine in children. While this medicine may be used in children as young as 1 month of age for selected conditions, precautions do  apply. Overdosage: If you think you have taken too much of this medicine contact a poison control center or emergency room at once. NOTE: This medicine is only for you. Do not share this medicine with others. What if I miss a dose? If you miss a dose, take it as soon as you can. If it is almost time for your next dose, take only that dose. Do not take double or extra doses. What may interact with this medicine? Do not take this medicine with any of the following medications:  epoetin alfa This list may not describe all possible interactions. Give your health care provider a list of all the medicines, herbs, non-prescription drugs, or dietary supplements you use. Also tell them if you smoke, drink alcohol, or use illegal drugs. Some items may interact with your medicine. What should I watch for while using this medicine? Your condition will be monitored carefully while you are receiving this medicine. You may need blood work done while you are taking this medicine. This medicine may cause a decrease in vitamin B6. You should make sure that you get enough vitamin B6 while you are taking this medicine. Discuss the foods you eat and the vitamins you take with your health care professional. What side effects may I notice from receiving this medicine? Side effects that you should report to your doctor or health care professional as soon as possible:  allergic reactions like skin rash, itching or hives, swelling of the face, lips, or tongue  breathing problems  changes in   vision  chest pain  confusion, trouble speaking or understanding  feeling faint or lightheaded, falls  high blood pressure  muscle aches or pains  pain, swelling, warmth in the leg  rapid weight gain  severe headaches  sudden numbness or weakness of the face, arm or leg  trouble walking, dizziness, loss of balance or coordination  seizures (convulsions)  swelling of the ankles, feet, hands  unusually weak or  tired Side effects that usually do not require medical attention (report to your doctor or health care professional if they continue or are bothersome):  diarrhea  fever, chills (flu-like symptoms)  headaches  nausea, vomiting  redness, stinging, or swelling at site where injected This list may not describe all possible side effects. Call your doctor for medical advice about side effects. You may report side effects to FDA at 1-800-FDA-1088. Where should I keep my medicine? Keep out of the reach of children. Store in a refrigerator between 2 and 8 degrees C (36 and 46 degrees F). Do not freeze. Do not shake. Throw away any unused portion if using a single-dose vial. Throw away any unused medicine after the expiration date. NOTE: This sheet is a summary. It may not cover all possible information. If you have questions about this medicine, talk to your doctor, pharmacist, or health care provider.  2021 Elsevier/Gold Standard (2017-10-04 16:44:20)  

## 2020-12-11 NOTE — Telephone Encounter (Signed)
Scheduled transfusion appointment per 3/11 schedule message. Patient is aware.

## 2020-12-11 NOTE — Progress Notes (Signed)
CRITICAL VALUE STICKER  CRITICAL VALUE: Hemoglobin 6.9  RECEIVER (on-site recipient of call): Wendall Mola, RN  [place] NOTIFIED: 12/11/2020 @ 1050  MESSENGER (representative from lab): Laqueta Due   MD NOTIFIED: Dr. Zola Button   TIME OF NOTIFICATION: 0034  RESPONSE: Acknowledged. Patient to receive 2 units of blood on Saturday 12/12/20. Patient is aware and is aware not to remove blue blood bank bracelet.

## 2020-12-12 ENCOUNTER — Other Ambulatory Visit: Payer: Self-pay

## 2020-12-12 ENCOUNTER — Inpatient Hospital Stay: Payer: Medicare Other

## 2020-12-12 DIAGNOSIS — N1831 Chronic kidney disease, stage 3a: Secondary | ICD-10-CM | POA: Diagnosis not present

## 2020-12-12 DIAGNOSIS — N189 Chronic kidney disease, unspecified: Secondary | ICD-10-CM

## 2020-12-12 MED ORDER — SODIUM CHLORIDE 0.9% IV SOLUTION
250.0000 mL | Freq: Once | INTRAVENOUS | Status: AC
  Administered 2020-12-12: 250 mL via INTRAVENOUS
  Filled 2020-12-12: qty 250

## 2020-12-12 MED ORDER — DIPHENHYDRAMINE HCL 25 MG PO CAPS
25.0000 mg | ORAL_CAPSULE | Freq: Once | ORAL | Status: AC
  Administered 2020-12-12: 25 mg via ORAL

## 2020-12-12 MED ORDER — ACETAMINOPHEN 325 MG PO TABS
650.0000 mg | ORAL_TABLET | Freq: Once | ORAL | Status: AC
  Administered 2020-12-12: 650 mg via ORAL

## 2020-12-12 NOTE — Patient Instructions (Signed)

## 2020-12-13 LAB — BPAM RBC
Blood Product Expiration Date: 202204112359
Blood Product Expiration Date: 202204112359
ISSUE DATE / TIME: 202203120817
ISSUE DATE / TIME: 202203120817
Unit Type and Rh: 5100
Unit Type and Rh: 5100

## 2020-12-13 LAB — TYPE AND SCREEN
ABO/RH(D): O POS
Antibody Screen: NEGATIVE
Unit division: 0
Unit division: 0

## 2021-01-08 ENCOUNTER — Observation Stay (HOSPITAL_COMMUNITY)
Admission: EM | Admit: 2021-01-08 | Discharge: 2021-01-10 | Disposition: A | Discharge status: Home / Self Care | Payer: Medicare Other | Attending: Internal Medicine | Admitting: Internal Medicine

## 2021-01-08 ENCOUNTER — Other Ambulatory Visit: Payer: Self-pay

## 2021-01-08 ENCOUNTER — Observation Stay (HOSPITAL_COMMUNITY): Payer: Medicare Other

## 2021-01-08 ENCOUNTER — Inpatient Hospital Stay: Payer: Medicare Other

## 2021-01-08 ENCOUNTER — Inpatient Hospital Stay: Payer: Medicare Other | Attending: Oncology

## 2021-01-08 ENCOUNTER — Encounter (HOSPITAL_COMMUNITY): Payer: Self-pay | Admitting: Emergency Medicine

## 2021-01-08 ENCOUNTER — Ambulatory Visit (HOSPITAL_BASED_OUTPATIENT_CLINIC_OR_DEPARTMENT_OTHER): Payer: Medicare Other | Admitting: Medical

## 2021-01-08 VITALS — BP 115/68 | HR 58 | Temp 98.2°F | Resp 19

## 2021-01-08 DIAGNOSIS — D631 Anemia in chronic kidney disease: Secondary | ICD-10-CM | POA: Diagnosis not present

## 2021-01-08 DIAGNOSIS — N1832 Chronic kidney disease, stage 3b: Secondary | ICD-10-CM | POA: Insufficient documentation

## 2021-01-08 DIAGNOSIS — K2970 Gastritis, unspecified, without bleeding: Principal | ICD-10-CM | POA: Insufficient documentation

## 2021-01-08 DIAGNOSIS — N183 Chronic kidney disease, stage 3 unspecified: Secondary | ICD-10-CM | POA: Diagnosis not present

## 2021-01-08 DIAGNOSIS — Z79899 Other long term (current) drug therapy: Secondary | ICD-10-CM | POA: Insufficient documentation

## 2021-01-08 DIAGNOSIS — F0390 Unspecified dementia without behavioral disturbance: Secondary | ICD-10-CM | POA: Diagnosis not present

## 2021-01-08 DIAGNOSIS — I679 Cerebrovascular disease, unspecified: Secondary | ICD-10-CM | POA: Insufficient documentation

## 2021-01-08 DIAGNOSIS — D62 Acute posthemorrhagic anemia: Secondary | ICD-10-CM | POA: Diagnosis present

## 2021-01-08 DIAGNOSIS — N189 Chronic kidney disease, unspecified: Secondary | ICD-10-CM

## 2021-01-08 DIAGNOSIS — U071 COVID-19: Secondary | ICD-10-CM | POA: Diagnosis not present

## 2021-01-08 DIAGNOSIS — M109 Gout, unspecified: Secondary | ICD-10-CM | POA: Insufficient documentation

## 2021-01-08 DIAGNOSIS — N1831 Chronic kidney disease, stage 3a: Secondary | ICD-10-CM | POA: Diagnosis present

## 2021-01-08 DIAGNOSIS — Z7982 Long term (current) use of aspirin: Secondary | ICD-10-CM | POA: Insufficient documentation

## 2021-01-08 DIAGNOSIS — D469 Myelodysplastic syndrome, unspecified: Secondary | ICD-10-CM | POA: Insufficient documentation

## 2021-01-08 DIAGNOSIS — I129 Hypertensive chronic kidney disease with stage 1 through stage 4 chronic kidney disease, or unspecified chronic kidney disease: Secondary | ICD-10-CM | POA: Diagnosis not present

## 2021-01-08 DIAGNOSIS — D649 Anemia, unspecified: Secondary | ICD-10-CM

## 2021-01-08 DIAGNOSIS — D61818 Other pancytopenia: Secondary | ICD-10-CM | POA: Insufficient documentation

## 2021-01-08 DIAGNOSIS — D500 Iron deficiency anemia secondary to blood loss (chronic): Secondary | ICD-10-CM | POA: Diagnosis not present

## 2021-01-08 DIAGNOSIS — E039 Hypothyroidism, unspecified: Secondary | ICD-10-CM | POA: Diagnosis not present

## 2021-01-08 DIAGNOSIS — I10 Essential (primary) hypertension: Secondary | ICD-10-CM | POA: Diagnosis present

## 2021-01-08 DIAGNOSIS — R195 Other fecal abnormalities: Secondary | ICD-10-CM | POA: Diagnosis present

## 2021-01-08 DIAGNOSIS — K921 Melena: Secondary | ICD-10-CM

## 2021-01-08 DIAGNOSIS — Z880 Allergy status to penicillin: Secondary | ICD-10-CM | POA: Diagnosis not present

## 2021-01-08 LAB — IRON AND TIBC
Iron: 53 ug/dL (ref 41–142)
Saturation Ratios: 17 % — ABNORMAL LOW (ref 21–57)
TIBC: 319 ug/dL (ref 236–444)
UIBC: 266 ug/dL (ref 120–384)

## 2021-01-08 LAB — CBC WITH DIFFERENTIAL (CANCER CENTER ONLY)
Abs Immature Granulocytes: 0.01 10*3/uL (ref 0.00–0.07)
Basophils Absolute: 0 10*3/uL (ref 0.0–0.1)
Basophils Relative: 0 %
Eosinophils Absolute: 0.1 10*3/uL (ref 0.0–0.5)
Eosinophils Relative: 2 %
HCT: 23.6 % — ABNORMAL LOW (ref 36.0–46.0)
Hemoglobin: 7.7 g/dL — ABNORMAL LOW (ref 12.0–15.0)
Immature Granulocytes: 0 %
Lymphocytes Relative: 42 %
Lymphs Abs: 1.3 10*3/uL (ref 0.7–4.0)
MCH: 25.2 pg — ABNORMAL LOW (ref 26.0–34.0)
MCHC: 32.6 g/dL (ref 30.0–36.0)
MCV: 77.4 fL — ABNORMAL LOW (ref 80.0–100.0)
Monocytes Absolute: 0.3 10*3/uL (ref 0.1–1.0)
Monocytes Relative: 9 %
Neutro Abs: 1.5 10*3/uL — ABNORMAL LOW (ref 1.7–7.7)
Neutrophils Relative %: 47 %
Platelet Count: 97 10*3/uL — ABNORMAL LOW (ref 150–400)
RBC: 3.05 MIL/uL — ABNORMAL LOW (ref 3.87–5.11)
RDW: 20.5 % — ABNORMAL HIGH (ref 11.5–15.5)
WBC Count: 3.2 10*3/uL — ABNORMAL LOW (ref 4.0–10.5)
nRBC: 0 % (ref 0.0–0.2)

## 2021-01-08 LAB — CMP (CANCER CENTER ONLY)
ALT: 10 U/L (ref 0–44)
AST: 20 U/L (ref 15–41)
Albumin: 3.9 g/dL (ref 3.5–5.0)
Alkaline Phosphatase: 135 U/L — ABNORMAL HIGH (ref 38–126)
Anion gap: 11 (ref 5–15)
BUN: 30 mg/dL — ABNORMAL HIGH (ref 8–23)
CO2: 21 mmol/L — ABNORMAL LOW (ref 22–32)
Calcium: 9.3 mg/dL (ref 8.9–10.3)
Chloride: 110 mmol/L (ref 98–111)
Creatinine: 1.73 mg/dL — ABNORMAL HIGH (ref 0.44–1.00)
GFR, Estimated: 29 mL/min — ABNORMAL LOW (ref >60)
Glucose, Bld: 90 mg/dL (ref 70–99)
Potassium: 4 mmol/L (ref 3.5–5.1)
Sodium: 142 mmol/L (ref 135–145)
Total Bilirubin: 0.3 mg/dL (ref 0.3–1.2)
Total Protein: 7.4 g/dL (ref 6.5–8.1)

## 2021-01-08 LAB — SAMPLE TO BLOOD BANK

## 2021-01-08 LAB — FERRITIN: Ferritin: 25 ng/mL (ref 11–307)

## 2021-01-08 LAB — POC OCCULT BLOOD, ED: Fecal Occult Bld: POSITIVE — AB

## 2021-01-08 LAB — RESP PANEL BY RT-PCR (FLU A&B, COVID) ARPGX2
Influenza A by PCR: NEGATIVE
Influenza B by PCR: NEGATIVE
SARS Coronavirus 2 by RT PCR: POSITIVE — AB

## 2021-01-08 MED ORDER — AMLODIPINE BESYLATE 10 MG PO TABS
10.0000 mg | ORAL_TABLET | Freq: Every day | ORAL | Status: DC
  Administered 2021-01-08 – 2021-01-09 (×2): 10 mg via ORAL
  Filled 2021-01-08: qty 2
  Filled 2021-01-08: qty 1

## 2021-01-08 MED ORDER — PANTOPRAZOLE SODIUM 40 MG PO TBEC
40.0000 mg | DELAYED_RELEASE_TABLET | Freq: Two times a day (BID) | ORAL | Status: DC
  Administered 2021-01-08 – 2021-01-10 (×4): 40 mg via ORAL
  Filled 2021-01-08 (×4): qty 1

## 2021-01-08 MED ORDER — ALLOPURINOL 300 MG PO TABS
300.0000 mg | ORAL_TABLET | Freq: Every day | ORAL | Status: DC
  Administered 2021-01-08 – 2021-01-10 (×3): 300 mg via ORAL
  Filled 2021-01-08 (×3): qty 1

## 2021-01-08 MED ORDER — ACETAMINOPHEN 650 MG RE SUPP: 650.0000 mg | Freq: Four times a day (QID) | RECTAL | Status: DC | PRN

## 2021-01-08 MED ORDER — ACETAMINOPHEN 325 MG PO TABS: 650.0000 mg | ORAL_TABLET | Freq: Four times a day (QID) | ORAL | Status: DC | PRN

## 2021-01-08 MED ORDER — DARBEPOETIN ALFA 500 MCG/ML IJ SOSY
500.0000 ug | PREFILLED_SYRINGE | Freq: Once | INTRAMUSCULAR | Status: AC
  Administered 2021-01-08: 500 ug via SUBCUTANEOUS

## 2021-01-08 MED ORDER — ONDANSETRON HCL 4 MG/2ML IJ SOLN
4.0000 mg | Freq: Four times a day (QID) | INTRAMUSCULAR | Status: DC | PRN
Start: 2021-01-08 — End: 2021-01-10

## 2021-01-08 MED ORDER — LEVOTHYROXINE SODIUM 100 MCG PO TABS
100.0000 ug | ORAL_TABLET | Freq: Every day | ORAL | Status: DC
  Administered 2021-01-09 – 2021-01-10 (×2): 100 ug via ORAL
  Filled 2021-01-08 (×2): qty 1

## 2021-01-08 MED ORDER — ONDANSETRON HCL 4 MG PO TABS
4.0000 mg | ORAL_TABLET | Freq: Four times a day (QID) | ORAL | Status: DC | PRN
Start: 2021-01-08 — End: 2021-01-10

## 2021-01-08 MED ORDER — LABETALOL HCL 200 MG PO TABS
200.0000 mg | ORAL_TABLET | Freq: Two times a day (BID) | ORAL | Status: DC
  Administered 2021-01-08 – 2021-01-10 (×4): 200 mg via ORAL
  Filled 2021-01-08 (×4): qty 1

## 2021-01-08 MED ORDER — DONEPEZIL HCL 10 MG PO TABS
10.0000 mg | ORAL_TABLET | Freq: Every day | ORAL | Status: DC
  Administered 2021-01-08 – 2021-01-10 (×3): 10 mg via ORAL
  Filled 2021-01-08 (×3): qty 1

## 2021-01-08 MED ORDER — MIRTAZAPINE 15 MG PO TABS
15.0000 mg | ORAL_TABLET | Freq: Every day | ORAL | Status: DC
  Administered 2021-01-08 – 2021-01-09 (×2): 15 mg via ORAL
  Filled 2021-01-08 (×2): qty 1

## 2021-01-08 MED ORDER — DARBEPOETIN ALFA 500 MCG/ML IJ SOSY
PREFILLED_SYRINGE | INTRAMUSCULAR | Status: AC
  Filled 2021-01-08: qty 1

## 2021-01-08 MED ORDER — MEMANTINE HCL 10 MG PO TABS
10.0000 mg | ORAL_TABLET | Freq: Two times a day (BID) | ORAL | Status: DC
  Administered 2021-01-08 – 2021-01-10 (×4): 10 mg via ORAL
  Filled 2021-01-08 (×4): qty 1

## 2021-01-08 NOTE — ED Notes (Signed)
Patient attempted to sign MSE form, signature pad not working in room.

## 2021-01-08 NOTE — Consult Note (Addendum)
Referring Provider:  EDP Primary Care Physician:  Sonia Side., [name] Primary Gastroenterologist:  Dr. Silverio Decamp  Reason for Consultation:  Anemia and heme positive stools  HPI: Keyonta Madrid [name] is a 80 y.o. female with PMH of chronic, multifactorial anemia who follows with hematology.  Receives Aranesp weekly.  Also has myelodysplasia, HTN, HLD, hypothyroidism.  Reports black stools for the past month with worsening intensity, particularly last evening when she had 3 rather large black stools.  Heme positive here.  Hgb 7.7 grams, which is about her baseline over the past several months.  Iron studies borderline low.  Had unremarkable colonoscopy in 2018.  Says that she had an EGD in [place] several years ago.  She denies abdominal pain.  No heartburn or reflux.  No dysphagia.  Says that she used to be on medicine for GERD but just avoids foods that tend to give her symptoms and she does not have any issues.  Diclofenac is on her medication list but she is unsure if she is taking it.  Colonoscopy 02/10/2017 showed pancolonic diverticulosis and internal hemorrhoids.  Past Medical History:  Diagnosis Date  . Anemia   . Arthritis   . Blood transfusion without reported diagnosis   . GERD (gastroesophageal reflux disease)   . HLD (hyperlipidemia)   . Hypertension   . Hypothyroidism   . Memory loss   . Myelodysplasia   . Vitamin D deficiency     Past Surgical History:  Procedure Laterality Date  . CARPAL TUNNEL RELEASE Right   . DILATION AND CURETTAGE OF UTERUS    . INGUINAL HERNIA REPAIR    . ROTATOR CUFF REPAIR Left   . TRANSANAL HEMORRHOIDAL DEARTERIALIZATION N/A 03/31/2017   Procedure: TRANSANAL HEMORRHOIDAL DEARTERIALIZATION;  Surgeon: Leighton Ruff, MD;  Location: [facility];  Service: General;  Laterality: N/A;  . UMBILICAL HERNIA REPAIR      Prior to Admission medications   Medication Sig Start Date End Date Taking? Authorizing Provider  acetaminophen (TYLENOL) 500 MG tablet  Take 500-1,000 mg by mouth every 6 (six) hours as needed (for pain.).    [provider]  allopurinol (ZYLOPRIM) 300 MG tablet Take 300 mg by mouth daily. 07/20/20   [provider]  amLODipine (NORVASC) 10 MG tablet TAKE 1/2 TABLET BY MOUTH DAILY AT BEDTIME; (TAKE 1 TABLET IF SBP IS >150) Patient taking differently: Take 10 mg by mouth at bedtime. (TAKE 1 TABLET IF SBP IS >150) 07/20/17   [name], NP  aspirin 81 MG chewable tablet Chew 81 mg by mouth daily.     [provider]  Cholecalciferol (VITAMIN D-3) 5000 UNITS TABS Take 15,000 Units by mouth daily.     [provider]  Darbepoetin Alfa 300 MCG/ML SOLN Inject 300 mcg into the skin See admin instructions. USE INJECTION EVERY 3 WEEKS AS NEEDED FOR HEMOGLOBIN LESS THAN 11.    [provider]  diclofenac (VOLTAREN) 75 MG EC tablet TAKE 1 TABLET(75 MG) BY MOUTH TWICE DAILY 08/22/18   [name], NP  diphenhydrAMINE (BENADRYL) 25 mg capsule One capsule every 6 hours x 3 days, then as needed for symptoms of rash/itching 03/25/17   Charlann Lange, PA-C  donepezil (ARICEPT) 10 MG tablet Take 10 mg by mouth daily. 07/20/20   [provider]  HYDROcodone-acetaminophen (NORCO/VICODIN) 5-325 MG tablet Take 1 tablet by mouth every 6 (six) hours as needed for moderate pain. 03/22/18   [name], NP  hydroxypropyl methylcellulose / hypromellose (ISOPTO TEARS /  GONIOVISC) 2.5 % ophthalmic solution Place 1-2 drops into both eyes 3 (three) times daily as needed for dry eyes.    [provider]  labetalol (NORMODYNE) 200 MG tablet Take 200 mg by mouth 2 (two) times daily. 06/23/20   [provider]  labetalol (NORMODYNE) 300 MG tablet TAKE 1 TABLET BY MOUTH TWICE DAILY 10/06/18   Unk Pinto, MD  levothyroxine (SYNTHROID) 100 MCG tablet Take 1 tablet daily on an empty stomach with only water for 30 minutes & no Antacid meds, Calcium or Magnesium for 4 hours & avoid Biotin  10/14/19   Unk Pinto, MD  Magnesium 500 MG TABS Take 500 mg by mouth every evening.    [provider]  memantine (NAMENDA) 10 MG tablet Take 1 tablet (10 mg total) by mouth 2 (two) times daily. 11/30/20   [name], NP  mirtazapine (REMERON) 15 MG tablet Take 15 mg by mouth at bedtime. 07/20/20   [provider]  OMEPRAZOLE PO Take 1 tablet by mouth as needed.     [provider]  pantoprazole (PROTONIX) 40 MG tablet TK 1 T PO QD FOR ACID REFLUX 05/25/18   [provider]    No current facility-administered medications for this encounter.    Allergies as of 01/08/2021 - Review Complete 01/08/2021  Allergen Reaction Noted  . Codeine Anaphylaxis 12/28/2011  . Penicillins Anaphylaxis 12/28/2011  . Bactrim Itching and Swelling 12/28/2011  . Clarithromycin Other (See Comments) 12/28/2011  . Flagyl [metronidazole hcl] Itching and Swelling 12/28/2011  . Food Other (See Comments) 03/24/2017  . Pineapple Itching and Swelling 03/24/2017  . Sulfa antibiotics Itching and Swelling 12/28/2011    Family History  Problem Relation Age of Onset  . Stroke Father   . Hypertension Father   . Other Mother        died at early age  . Stroke Sister   . Coronary artery disease Brother   . Hypertension Sister   . Bladder Cancer Sister   . Colon cancer Neg Hx   . Stomach cancer Neg Hx     Social History   Socioeconomic History  . Marital status: Single    Spouse name: Not on file  . Number of children: 1  . Years of education: college  . Highest education level: Bachelor's degree (e.g., BA, AB, BS)  Occupational History  . Occupation: retired    Fish farm manager: RETIRED  Tobacco Use  . Smoking status: Former Smoker    Types: Cigarettes    Quit date: 10/02/1996    Years since quitting: 24.2  . Smokeless tobacco: Never Used  Vaping Use  . Vaping Use: Never used  Substance and Sexual Activity  . Alcohol use: Yes    Comment: glass of wine at least  once a month.  . Drug use: No  . Sexual activity: Not Currently    Partners: Male  Other Topics Concern  . Not on file  Social History Narrative   Lives at home alone.   Right-handed.   No daily use of caffeine.   Social Determinants of Health   Financial Resource Strain: Not on file  Food Insecurity: Not on file  Transportation Needs: Not on file  Physical Activity: Not on file  Stress: Not on file  Social Connections: Not on file  Intimate Partner Violence: Not on file    Review of Systems: ROS is O/W negative except as mentioned in HPI.  Physical Exam: Vital signs in last 24 hours: Temp:  [  97.9 F (36.6 C)-98.2 F (36.8 C)] 98 F (36.7 C) (04/08 1529) Pulse Rate:  [53-67] 59 (04/08 1529) Resp:  [16-19] 18 (04/08 1529) BP: (115-147)/(63-113) 123/81 (04/08 1529) SpO2:  [98 %-100 %] 100 % (04/08 1529) Weight:  [62.6 kg] 62.6 kg (04/08 1137)   General:  Alert, Well-developed, well-nourished, pleasant and cooperative in NAD Head:  Normocephalic and atraumatic. Eyes:  Sclera clear, no icterus.  Conjunctiva pink. Ears:  Normal auditory acuity. Mouth:  No deformity or lesions.   Lungs:  Clear throughout to auscultation.  No wheezes, crackles, or rhonchi.  Heart:  Regular rate and rhythm; no murmurs, clicks, rubs, or gallops. Abdomen:  Soft, non-distended.  BS present.  Non-tender. Rectal:  Deferred.  Heme positive in the ED.  Msk:  Symmetrical without gross deformities. Pulses:  Normal pulses noted. Extremities:  Without clubbing or edema. Neurologic:  Alert and oriented x 4;  grossly normal neurologically. Skin:  Intact without significant lesions or rashes. Psych:  Alert and cooperative. Normal mood and affect.  Lab Results: Recent Labs    01/08/21 0955  WBC 3.2*  HGB 7.7*  HCT 23.6*  PLT 97*   BMET Recent Labs    01/08/21 0955  NA 142  K 4.0  CL 110  CO2 21*  GLUCOSE 90  BUN 30*  CREATININE 1.73*  CALCIUM 9.3   LFT Recent Labs     01/08/21 0955  PROT 7.4  ALBUMIN 3.9  AST 20  ALT 10  ALKPHOS 135*  BILITOT 0.3   IMPRESSION:  -80 year old female with chronic, multifactorial anemia who follows with hematology.  Receives Aranesp weekly.  Reports black stools for the past month with worsening intensity, particularly last evening.  Heme positive here.  Hgb 7.7 grams, which is about her baseline over the past several months.  Iron studies borderline low.  Had unremarkable colonoscopy in 2018.  EGD several years ago in [place].  Diclofenac is on her list but she is unsure if she is taking it.    PLAN: -Ok for diet today then NPO after midnight. -Tentative plan is for EGD on 4/9. -Monitor Hgb and transfuse if needed. -Pantoprazole 40 mg PO BID.   Laban Emperor. Zehr  01/08/2021, 3:31 PM   Attending physician's note   I have taken a history, examined the patient and reviewed the chart. I agree with the Advanced Practitioner's note, impression and recommendations.  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  She has had on and off melena in the past but she feels it is worse now She has received 4 PRBC transfusions in the past 6 months  She is current with colorectal cancer screening, last colonoscopy in 2018 with no polyps or mass lesions.  She is Covid positive but asymptomatic  Monitor hemoglobin and transfuse if below 7  We will plan for EGD tomorrow, Dr. Ardis Hughs will reevaluate in the a.m. N.p.o. after midnight Continued pantoprazole 40 mg twice daily  The risks and benefits as well as alternatives of endoscopic procedure(s) have been discussed and reviewed. All questions answered. The patient agrees to proceed.    The patient was provided an opportunity to ask questions and all were answered. The patient agreed with the plan and  demonstrated an understanding of the instructions.  Damaris Hippo , MD [phone]

## 2021-01-08 NOTE — ED Triage Notes (Signed)
Sent over from the cancer center for low HMG (7.7) and dark tarry stools x1 month. Patient is alert and oriented. Last BM was evening of 4/7, dark and tarry.

## 2021-01-08 NOTE — ED Provider Notes (Signed)
[facility] DEPT Provider Note   CSN: [id number] Arrival date & time: 01/08/21  1126     History No chief complaint on file.   Susan [name] is a 80 y.o. 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 Intermittent for the last 3-4 weeks.  Initially a few times a week but now more frequent almost daily. Last night she had 3 large black loose stools.  Reports hemorrhoids and occasionally wiping and seeing bright red blood on the toilet paper.  She was scheduled to get Arnesp today but when she reported her black stools provider sent to the ER for further evaluation.  No history of GI bleed, ulcers. She is getting blood transfusions monthly. Last blood transfusion on 3/12.  Does not take iron.  She takes 81 mg aspirin daily. Does not take iron. Has been otherwise feeling well lately. No CP, SOB, light headedness, syncope.  She is not on anticoagulants.   HPI     Past Medical History:  Diagnosis Date  . Anemia   . Arthritis   . Blood transfusion without reported diagnosis   . GERD (gastroesophageal reflux disease)   . HLD (hyperlipidemia)   . Hypertension   . Hypothyroidism   . Memory loss   . Myelodysplasia   . Vitamin D deficiency     Patient Active Problem List   Diagnosis Date Noted  . Anemia 01/08/2021  . Cerebral vascular disease 06/09/2020  . Pain due to onychomycosis of toenails of both feet 04/08/2020  . Porokeratosis 04/08/2020  . Dementia without behavioral disturbance ([place]) 02/27/2020  . 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 BMI 29.0-29.9,adult 09/02/2015  . Encounter for Medicare annual wellness exam 06/02/2015  . 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[place]) 11/03/2014  . CKD, 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) 11/02/2014  . Medication management 01/27/2014  . Hyperlipidemia 08/27/2013  . Essential hypertension   . Hypothyroidism   .  GERD (gastroesophageal reflux disease)   . Vitamin D deficiency   . Hemorrhoids 07/15/2013    Past Surgical History:  Procedure Laterality Date  . CARPAL TUNNEL RELEASE Right   . DILATION AND CURETTAGE OF UTERUS    . INGUINAL HERNIA REPAIR    . ROTATOR CUFF REPAIR Left   . TRANSANAL HEMORRHOIDAL DEARTERIALIZATION N/A 03/31/2017   Procedure: TRANSANAL HEMORRHOIDAL DEARTERIALIZATION;  Surgeon: Leighton Ruff, MD;  Location: [facility];  Service: General;  Laterality: N/A;  . UMBILICAL HERNIA REPAIR       OB History   No obstetric history on file.     Family History  Problem Relation Age of Onset  . Stroke Father   . Hypertension Father   . Other Mother        died at early age  . Stroke Sister   . Coronary artery disease Brother   . Hypertension Sister   . Bladder Cancer Sister   . Colon cancer Neg Hx   . Stomach cancer Neg Hx     Social History   Tobacco Use  . Smoking status: Former Smoker    Types: Cigarettes    Quit date: 10/02/1996    Years since quitting: 24.2  . Smokeless tobacco: Never Used  Vaping Use  . Vaping Use: Never used  Substance Use Topics  . Alcohol use: Yes    Comment: glass of wine at least once a month.  . Drug use: No  Home Medications Prior to Admission medications   Medication Sig Start Date End Date Taking? Authorizing Provider  acetaminophen (TYLENOL) 500 MG tablet Take 500-1,000 mg by mouth every 6 (six) hours as needed (for pain.).    [provider]  allopurinol (ZYLOPRIM) 300 MG tablet Take 300 mg by mouth daily. 07/20/20   [provider]  amLODipine (NORVASC) 10 MG tablet TAKE 1/2 TABLET BY MOUTH DAILY AT BEDTIME; (TAKE 1 TABLET IF SBP IS >150) Patient taking differently: Take 10 mg by mouth at bedtime. (TAKE 1 TABLET IF SBP IS >150) 07/20/17   [name], NP  aspirin 81 MG chewable tablet Chew 81 mg by mouth daily.     [provider]  Cholecalciferol (VITAMIN D-3) 5000 UNITS TABS Take 15,000 Units  by mouth daily.     [provider]  Darbepoetin Alfa 300 MCG/ML SOLN Inject 300 mcg into the skin See admin instructions. USE INJECTION EVERY 3 WEEKS AS NEEDED FOR HEMOGLOBIN LESS THAN 11.    [provider]  diclofenac (VOLTAREN) 75 MG EC tablet TAKE 1 TABLET(75 MG) BY MOUTH TWICE DAILY 08/22/18   [name], NP  diphenhydrAMINE (BENADRYL) 25 mg capsule One capsule every 6 hours x 3 days, then as needed for symptoms of rash/itching 03/25/17   Charlann Lange, PA-C  donepezil (ARICEPT) 10 MG tablet Take 10 mg by mouth daily. 07/20/20   [provider]  HYDROcodone-acetaminophen (NORCO/VICODIN) 5-325 MG tablet Take 1 tablet by mouth every 6 (six) hours as needed for moderate pain. 03/22/18   [name], NP  hydroxypropyl methylcellulose / hypromellose (ISOPTO TEARS / GONIOVISC) 2.5 % ophthalmic solution Place 1-2 drops into both eyes 3 (three) times daily as needed for dry eyes.    [provider]  labetalol (NORMODYNE) 200 MG tablet Take 200 mg by mouth 2 (two) times daily. 06/23/20   [provider]  labetalol (NORMODYNE) 300 MG tablet TAKE 1 TABLET BY MOUTH TWICE DAILY 10/06/18   Unk Pinto, MD  levothyroxine (SYNTHROID) 100 MCG tablet Take 1 tablet daily on an empty stomach with only water for 30 minutes & no Antacid meds, Calcium or Magnesium for 4 hours & avoid Biotin 10/14/19   Unk Pinto, MD  Magnesium 500 MG TABS Take 500 mg by mouth every evening.    [provider]  memantine (NAMENDA) 10 MG tablet Take 1 tablet (10 mg total) by mouth 2 (two) times daily. 11/30/20   [name], NP  mirtazapine (REMERON) 15 MG tablet Take 15 mg by mouth at bedtime. 07/20/20   [provider]  OMEPRAZOLE PO Take 1 tablet by mouth as needed.     [provider]  pantoprazole (PROTONIX) 40 MG tablet TK 1 T PO QD FOR ACID REFLUX 05/25/18   [provider]    Allergies    Codeine, Penicillins, Bactrim,  Clarithromycin, Flagyl [metronidazole hcl], Food, Pineapple, and Sulfa antibiotics  Review of Systems   Review of Systems  Gastrointestinal:       Black tarry stools   All other systems reviewed and are negative.   Physical Exam Updated Vital Signs BP 115/63 (BP Location: Left Arm)   Pulse 65   Temp 97.9 F (36.6 C) (Oral)   Resp 16   Ht 5\' 2"  (1.575 m)   Wt 62.6 kg   SpO2 100%   BMI 25.24 kg/m   Physical Exam Vitals and nursing note reviewed.  Constitutional:      Appearance: She is well-developed.  Comments: Non toxic in NAD  HENT:     Head: Normocephalic and atraumatic.     Nose: Nose normal.  Eyes:     Conjunctiva/sclera: Conjunctivae normal.  Cardiovascular:     Rate and Rhythm: Normal rate and regular rhythm.  Pulmonary:     Effort: Pulmonary effort is normal.     Breath sounds: Normal breath sounds.  Abdominal:     General: Bowel sounds are normal.     Palpations: Abdomen is soft.     Tenderness: There is no abdominal tenderness.     Comments: No G/R/R. No suprapubic or CVA tenderness. Negative Murphy's and McBurney's. Active BS to lower quadrants.   Genitourinary:    Rectum: Guaiac result positive.     Comments:  RN at bedside during exam. Several external non tender hemorrhoids noted, some appear erythematous but no active bright red blood. Normal rectal tone. Dark brown/black stool on finger.  Musculoskeletal:        General: Normal range of motion.     Cervical back: Normal range of motion.  Skin:    General: Skin is warm and dry.     Capillary Refill: Capillary refill takes less than 2 seconds.  Neurological:     Mental Status: She is alert.  Psychiatric:        Behavior: Behavior normal.     ED Results / Procedures / Treatments   Labs (all labs ordered are listed, but only abnormal results are displayed) Labs Reviewed  POC OCCULT BLOOD, ED - Abnormal; Notable for the following components:      Result Value   Fecal Occult Bld POSITIVE  (*)    All other components within normal limits  RESP PANEL BY RT-PCR (FLU A&B, COVID) [id number]    EKG None  Radiology No results found.  Procedures Procedures   Medications Ordered in ED Medications - No data to display  ED Course  I have reviewed the triage vital signs and the nursing notes.  Pertinent labs & imaging results that were available during my care of the patient were reviewed by me and considered in my medical decision making (see chart for details).  Clinical Course as of 01/08/21 1445  Fri Jan 08, 2021  1310 Fecal Occult Blood, POC(!): POSITIVE [CG]    Clinical Course User Index [CG] Arlean Hopping   MDM Rules/Calculators/A&P                          80 yo F with history of anemia requiring monthly transfusions (last 3/12, 2 units PRBCs) and Arsnep here for black tarry stools for 3-4 weeks now becoming more frequent. No red flags like light headedness, syncope, CP, SOB, abdominal pain. No history of GIB. Takes 81 mg daily aspirin. Exam reveals very dark brown/black stool, irritated non bleeding hemorrhoids. Positive hemoccult. HD stable. No abdominal tenderness. Asymptomatic.   EMR triage and nursing notes reviewed  Labs obtained at cancer/hematology center reviewed  Hemoglobin 7.7 from 6.9 one month ago. She received 2 units PRBCs on 3/12.  PLT 97 from 106. Creatinine 1.73. GFR 29.   Given chronic anemia, age, 3 large black BMs last night with hemoglobin 7.7 feel it is reasonable to bring patient in for observation, GI consult and maybe EGD urgently.  It may take longer to discharge with OP follow up. Will consult medicine and GI.   Discussed with hospitalist Dr Loleta Books who requests GI consult.   I spoke  with Janett Billow with [facility]. She will see patient - states not unreasonable to bring patient in for observation.    1440: Update - Spoke to Dr Loleta Books who spoke to Dr Silverio Decamp GI directly who feels patient can be worked up as outpatient and  discharged. Dr Loleta Books will come see patient in the ED and determine disposition.  Discussed with EDP [place].   Final Clinical Impression(s) / ED Diagnoses Final diagnoses:  Heme positive stool    Rx / DC Orders ED Discharge Orders    None       Kinnie Feil, PA-C 01/08/21 1446    Sherwood Gambler, MD 01/14/21 [phone]

## 2021-01-08 NOTE — Progress Notes (Signed)
1035 Pt arrived for an arnesp injection. Stated she is having frequent black, tarry stools. Stated she doesn't see Dr. Keene Breath until 1 month. RN informed Sandi Mealy, PA, who spoke to MD and is at injection room to evaluate pt. Decision made to check pt into ED for further eval.

## 2021-01-08 NOTE — Progress Notes (Signed)
Report called to Joellen Jersey, Therapist, sports. Patient being transferred to 1422 due to being COVID +.

## 2021-01-08 NOTE — Progress Notes (Signed)
Critical called for positive COVID test. Myrene Buddy, MD notified and bed request placed. Notified patient of results and necessary precautions.

## 2021-01-08 NOTE — Progress Notes (Signed)
[facility] Progress Note   Susan [name] [id number] 02-22-1941 80 y.o.  Susan [name] is managed by Dr. 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 Alen Blew and is treated with supportive care with Aranesp and transfusions as needed.  She is neck scheduled to see Dr. Alen Blew in follow-up on 02/05/2021.  Melena: Patient reports that she has had ongoing melena for weeks to months.  She reports that she had brought this to the attention of her primary care provider who did not intervene.  Her hemoglobin today was 7.7.  She did receive Aranesp today.  Plans were for her to receive blood.  Based on her report of melena she was taken to the emergency room for evaluation and management.  Please see After Visit Summary for patient specific instructions.  Future Appointments  Date Time Provider [place]  02/05/2021  9:30 AM [facility] None  02/05/2021 10:00 AM Wyatt Portela, MD [facility] None  02/05/2021 10:45 AM [facility] [place] FLUSH [facility] None  03/03/2021 10:15 AM Gardiner Barefoot, DPM [facility]  03/12/2021 10:00 AM [facility] None  03/12/2021 10:45 AM [facility] [place] FLUSH [facility] None  04/09/2021 10:00 AM [facility] None  04/09/2021 10:45 AM [facility] [place] FLUSH [facility] None  05/14/2021 10:00 AM [facility] None  05/14/2021 10:45 AM [facility] [place] FLUSH [facility] None  06/11/2021 10:00 AM [facility] None  06/11/2021 10:45 AM [facility] [place] FLUSH [facility] None  07/16/2021  10:00 AM [facility] None  07/16/2021 10:45 AM [facility] [place] FLUSH [facility] None  11/30/2021  8:15 AM [name], NP [facility] None    No orders of the defined types were placed in this encounter.      Subjective:   Patient ID:  Susan [name] is a 80 y.o. (DOB 07-28-1941) female.  Chief Complaint: No chief complaint on file.   HPI Susan [name]  is a 80 y.o. female with a diagnosis of anemia secondary to renal disease and myelodysplasia.  Patient is managed by Dr. Alen Blew and is treated with supportive care.  She receives transfusions as needed and Aranesp as indicated.  She was seen today and in the flush room where she was given Aranesp.  Her hemoglobin today was 7.7.  She reported to the nurse there that she has been having ongoing melena.  She reports that she has dark tarry stools.  She reports that she is told this to her primary care provider who has not intervened today according to her report.  She reports having some weakness but denies dizziness or orthostasis.  She is agreeable to be transferred ported to the emergency room for evaluation and management.    Medications: I have reviewed the patient's current medications.  Allergies:  Allergies  Allergen Reactions  . Codeine Anaphylaxis  . Penicillins Anaphylaxis  . Bactrim Itching and Swelling  . Clarithromycin Other (See Comments)    "Caused skin to peel off my hand."  . Flagyl [Metronidazole Hcl] Itching and Swelling  . Food Other (See Comments)    EATS WHITE RICE ONLY (ALLERGIC REACTION TO YELLOW OR BROWN RICE THAT  RESULTED IN HOSPITALIZATION)  . Pineapple Itching and Swelling    SWELLING OF EYES  . Sulfa Antibiotics Itching and Swelling    Past Medical History:  Diagnosis Date  . Anemia   . Arthritis   . Blood transfusion without reported diagnosis   . GERD (gastroesophageal reflux disease)   . HLD (hyperlipidemia)   . Hypertension   . Hypothyroidism   . Memory loss   . Myelodysplasia    . Vitamin D deficiency     Past Surgical History:  Procedure Laterality Date  . CARPAL TUNNEL RELEASE Right   . DILATION AND CURETTAGE OF UTERUS    . INGUINAL HERNIA REPAIR    . ROTATOR CUFF REPAIR Left   . TRANSANAL HEMORRHOIDAL DEARTERIALIZATION N/A 03/31/2017   Procedure: TRANSANAL HEMORRHOIDAL DEARTERIALIZATION;  Surgeon: Leighton Ruff, MD;  Location: [facility];  Service: General;  Laterality: N/A;  . UMBILICAL HERNIA REPAIR      Family History  Problem Relation Age of Onset  . Stroke Father   . Hypertension Father   . Other Mother        died at early age  . Stroke Sister   . Coronary artery disease Brother   . Hypertension Sister   . Bladder Cancer Sister   . Colon cancer Neg Hx   . Stomach cancer Neg Hx     Social History   Socioeconomic History  . Marital status: Single    Spouse name: Not on file  . Number of children: 1  . Years of education: college  . Highest education level: Bachelor's degree (e.g., BA, AB, BS)  Occupational History  . Occupation: retired    Fish farm manager: RETIRED  Tobacco Use  . Smoking status: Former Smoker    Types: Cigarettes    Quit date: 10/02/1996    Years since quitting: 24.2  . Smokeless tobacco: Never Used  Vaping Use  . Vaping Use: Never used  Substance and Sexual Activity  . Alcohol use: Yes    Comment: glass of wine at least once a month.  . Drug use: No  . Sexual activity: Not Currently    Partners: Male  Other Topics Concern  . Not on file  Social History Narrative   Lives at home alone.   Right-handed.   No daily use of caffeine.   Social Determinants of Health   Financial Resource Strain: Not on file  Food Insecurity: Not on file  Transportation Needs: Not on file  Physical Activity: Not on file  Stress: Not on file  Social Connections: Not on file  Intimate Partner Violence: Not on file    Past Medical History, Surgical history, Social history, and Family history were reviewed and updated as appropriate.    Please see review of systems for further details on the patient's review from today.   Review of Systems:  Review of Systems  Constitutional: Negative for chills, diaphoresis and fever.  HENT: Negative for trouble swallowing and voice change.   Respiratory: Negative for cough, chest tightness, shortness of breath and wheezing.   Cardiovascular: Negative for chest pain and palpitations.  Gastrointestinal: Negative for abdominal pain, constipation, diarrhea, nausea and vomiting.       Dark tarry stools  Musculoskeletal: Negative for back pain and myalgias.  Neurological: Negative for dizziness, light-headedness and headaches.    Objective:   Physical Exam:  There were no vitals taken for this visit. ECOG: 1  Physical Exam Constitutional:      General: She is not in  acute distress.    Appearance: She is not diaphoretic.  HENT:     Head: Normocephalic and atraumatic.  Eyes:     General: No scleral icterus.       Right eye: No discharge.        Left eye: No discharge.     Conjunctiva/sclera: Conjunctivae normal.  Cardiovascular:     Rate and Rhythm: Normal rate and regular rhythm.     Heart sounds: Normal heart sounds. No murmur heard. No friction rub. No gallop.   Pulmonary:     Effort: Pulmonary effort is normal. No respiratory distress.     Breath sounds: Normal breath sounds. No wheezing or rales.  Abdominal:     General: Bowel sounds are normal. There is no distension.     Tenderness: There is no abdominal tenderness. There is no guarding.  Skin:    General: Skin is warm and dry.     Findings: No erythema or rash.  Neurological:     Mental Status: She is alert.     Coordination: Coordination normal.     Gait: Gait normal.  Psychiatric:        Mood and Affect: Mood normal.        Behavior: Behavior normal.        Thought Content: Thought content normal.        Judgment: Judgment normal.     Lab Review:     Component Value Date/Time   NA 142 01/08/2021  0955   NA 143 06/30/2017 0846   K 4.0 01/08/2021 0955   K 4.4 06/30/2017 0846   CL 110 01/08/2021 0955   CO2 21 (L) 01/08/2021 0955   CO2 24 06/30/2017 0846   GLUCOSE 90 01/08/2021 0955   GLUCOSE 89 06/30/2017 0846   BUN 30 (H) 01/08/2021 0955   BUN 26.5 (H) 06/30/2017 0846   CREATININE 1.73 (H) 01/08/2021 0955   CREATININE 1.31 (H) 07/02/2018 0950   CREATININE 1.4 (H) 06/30/2017 0846   CALCIUM 9.3 01/08/2021 0955   CALCIUM 10.1 06/30/2017 0846   PROT 7.4 01/08/2021 0955   PROT 7.6 06/30/2017 0846   ALBUMIN 3.9 01/08/2021 0955   ALBUMIN 4.1 06/30/2017 0846   AST 20 01/08/2021 0955   AST 17 06/30/2017 0846   ALT 10 01/08/2021 0955   ALT <6 06/30/2017 0846   ALKPHOS 135 (H) 01/08/2021 0955   ALKPHOS 101 06/30/2017 0846   BILITOT 0.3 01/08/2021 0955   BILITOT 0.76 06/30/2017 0846   GFRNONAA 29 (L) 01/08/2021 0955   GFRNONAA 39 (L) 07/02/2018 0950   GFRAA 30 (L) 05/22/2020 1123   GFRAA 45 (L) 07/02/2018 0950       Component Value Date/Time   WBC 3.2 (L) 01/08/2021 0955   WBC 4.1 08/24/2018 1041   RBC 3.05 (L) 01/08/2021 0955   HGB 7.7 (L) 01/08/2021 0955   HGB 10.3 (L) 09/22/2017 0813   HCT 23.6 (L) 01/08/2021 0955   HCT 30.4 (L) 09/22/2017 0813   PLT 97 (L) 01/08/2021 0955   PLT 140 (L) 09/22/2017 0813   MCV 77.4 (L) 01/08/2021 0955   MCV 84.3 09/22/2017 0813   MCH 25.2 (L) 01/08/2021 0955   MCHC 32.6 01/08/2021 0955   RDW 20.5 (H) 01/08/2021 0955   RDW 15.2 (H) 09/22/2017 0813   LYMPHSABS 1.3 01/08/2021 0955   LYMPHSABS 1.6 09/22/2017 0813   MONOABS 0.3 01/08/2021 0955   MONOABS 0.2 09/22/2017 0813   EOSABS 0.1 01/08/2021 0955   EOSABS 0.1 09/22/2017  0813   BASOSABS 0.0 01/08/2021 0955   BASOSABS 0.0 09/22/2017 0813   -------------------------------  Imaging from last 24 hours (if applicable):  Radiology interpretation: No results found.      This case was discussed Dr. Alen Blew. He expressed agreement with my management of this patient.

## 2021-01-08 NOTE — H&P (Addendum)
History and Physical  Patient Name: Laquanta Hummel Dues     PNT:[id number]    DOB: 1941-05-28    DOA: 01/08/2021 PCP: Sonia Side., [name]  Patient coming from: [facility] > ER  Chief Complaint: Melena      HPI: Dorsie Sethi Radovich is a 80 y.o. 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  Patient has no history of GI bleed or ulcers, but has hx advanced diverticulosis and did have an episode a few years ago of self-limited "dark stools", resolved without intervention.  Recently, in the last 4 months, the patient has required monthyl blood transfusions, presumed by her Hematologist due to her advanced anemia of combined renal disease and mild MDS.    In the last 4 weeks, she has had "dark stools". At first these were occasional, then "daily".  Now this week, they are after every time she eats, and last night, after eating she had "large dark diarrhea".  During this time, the patient has not had accelerating or new dyspnea, dyspnea on exertion, fainting, or fatigue more than her baseline. Today at her routine Hematology appointment, she mentioned this to staff, and was sent to the ER for evaluation.    In the ER: Hgb 7.7 g/dL, slightly lower tahn her recent baseline ~8 g/dL.  Rectal exam with "dark brown stool" and was FOBT+.  No active bleeding. HR and BP stable.                  ROS: Review of Systems  Constitutional: Positive for malaise/fatigue (chronic, longer than 6 months). Negative for chills and fever.  Respiratory: Negative for shortness of breath.   Cardiovascular: Negative for chest pain.  Gastrointestinal: Positive for melena. Negative for abdominal pain, blood in stool, constipation, diarrhea, nausea and vomiting.  Neurological: Negative  for loss of consciousness.  All other systems reviewed and are negative.         Past Medical History:  Diagnosis Date  . Anemia   . Arthritis   . Blood transfusion without reported diagnosis   . GERD (gastroesophageal reflux disease)   . HLD (hyperlipidemia)   . Hypertension   . Hypothyroidism   . Memory loss   . Myelodysplasia   . Vitamin D deficiency     Past Surgical History:  Procedure Laterality Date  . CARPAL TUNNEL RELEASE Right   . DILATION AND CURETTAGE OF UTERUS    . INGUINAL HERNIA REPAIR    . ROTATOR CUFF REPAIR Left   . TRANSANAL HEMORRHOIDAL DEARTERIALIZATION N/A 03/31/2017   Procedure: TRANSANAL HEMORRHOIDAL DEARTERIALIZATION;  Surgeon: Leighton Ruff, MD;  Location: [facility];  Service: General;  Laterality: N/A;  . UMBILICAL HERNIA REPAIR      Social History: Patient lives alone in a senior community.  Has a caregiver that checks on her daily.  Has a son in [place].  The patient walks with a cane, can ambulate 1-2 blocks at baseline.  Nonsmoker.  Denies alcohol.    Allergies  Allergen Reactions  . Codeine Anaphylaxis  . Penicillins Anaphylaxis  . Bactrim Itching and Swelling  . Clarithromycin Other (See Comments)    "Caused skin to peel off my hand."  . Flagyl [Metronidazole Hcl] Itching and Swelling  . Food Other (See Comments)  EATS WHITE RICE ONLY (ALLERGIC REACTION TO YELLOW OR BROWN RICE THAT RESULTED IN HOSPITALIZATION)  . Pineapple Itching and Swelling    SWELLING OF EYES  . Sulfa Antibiotics Itching and Swelling    Family history: family history includes Bladder Cancer in her sister; Coronary artery disease in her brother; Hypertension in her father and sister; Other in her mother; Stroke in her father and sister.  Prior to Admission medications   Medication Sig Start Date End Date Taking? Authorizing Provider  acetaminophen (TYLENOL) 500 MG tablet Take 500-1,000 mg by mouth every 6 (six) hours as needed (for pain.).    [provider]  allopurinol (ZYLOPRIM) 300 MG tablet Take 300 mg by mouth daily. 07/20/20   [provider]  amLODipine (NORVASC) 10 MG tablet TAKE 1/2 TABLET BY MOUTH DAILY AT BEDTIME; (TAKE 1 TABLET IF SBP IS >150) Patient taking differently: Take 10 mg by mouth at bedtime. (TAKE 1 TABLET IF SBP IS >150) 07/20/17   [name], NP  aspirin 81 MG chewable tablet Chew 81 mg by mouth daily.     [provider]  Cholecalciferol (VITAMIN D-3) 5000 UNITS TABS Take 15,000 Units by mouth daily.     [provider]  Darbepoetin Alfa 300 MCG/ML SOLN Inject 300 mcg into the skin See admin instructions. USE INJECTION EVERY 3 WEEKS AS NEEDED FOR HEMOGLOBIN LESS THAN 11.    [provider]  diphenhydrAMINE (BENADRYL) 25 mg capsule One capsule every 6 hours x 3 days, then as needed for symptoms of rash/itching 03/25/17   Charlann Lange, PA-C  donepezil (ARICEPT) 10 MG tablet Take 10 mg by mouth daily. 07/20/20   [provider]  hydroxypropyl methylcellulose / hypromellose (ISOPTO TEARS / GONIOVISC) 2.5 % ophthalmic solution Place 1-2 drops into both eyes 3 (three) times daily as needed for dry eyes.    [provider]  labetalol (NORMODYNE) 200 MG tablet Take 200 mg by mouth 2 (two) times daily. 06/23/20   [provider]  labetalol (NORMODYNE) 300 MG tablet TAKE 1 TABLET BY MOUTH TWICE DAILY 10/06/18   Unk Pinto, MD  levothyroxine (SYNTHROID) 100 MCG tablet Take 1 tablet daily on an empty stomach with only water for 30 minutes & no Antacid meds, Calcium or Magnesium for 4 hours & avoid Biotin 10/14/19   Unk Pinto, MD  Magnesium 500 MG TABS Take 500 mg by mouth every evening.    [provider]  memantine (NAMENDA) 10 MG tablet Take 1 tablet (10 mg total) by mouth 2 (two) times daily. 11/30/20   [name], NP  mirtazapine (REMERON) 15 MG tablet Take 15 mg by mouth at bedtime. 07/20/20   [provider]  OMEPRAZOLE  PO Take 1 tablet by mouth as needed.     [provider]  pantoprazole (PROTONIX) 40 MG tablet TK 1 T PO QD FOR ACID REFLUX 05/25/18   [provider]       Physical Exam: BP 123/81 (BP Location: Right Arm)   Pulse (!) 59   Temp 98 F (36.7 C) (Oral)   Resp 18   Ht 5\' 2"  (1.575 m)   Wt 62.6 kg   SpO2 100%   BMI 25.24 kg/m  General appearance: Thin elderly adult female, alert and in no acute distress.   Eyes: Anicteric, conjunctiva pink, lids and lashes normal. PERRL.    ENT: No nasal deformity, discharge, epistaxis.  Hearing normal.  Lpis normal, dentition in good repair. OP moist  without lesions.   Neck: No neck masses.  Trachea midline.  No thyromegaly/tenderness. Lymph: No cervical or supraclavicular lymphadenopathy. Skin: Warm and dry.  No jaundice.  No suspicious rashes or lesions. Cardiac: RRR, nl S1-S2, no murmurs appreciated.  Capillary refill is brisk.  JVP not visible.  No LE edema.  Radial pulses 2+ and symmetric. Respiratory: Normal respiratory rate and rhythm.  CTAB without rales or wheezes. Abdomen: Abdomen soft.  No TTP or guarding. No ascites, distension, hepatosplenomegaly.   MSK: No deformities or effusions of the large joints of the upper or lower extremities bilaterally.  No cyanosis or clubbing. Neuro: Cranial nerves normal.  Sensation intact to light touch. Speech is fluent.  Muscle strength 5-/5 and symmetric.    Psych: Sensorium intact and responding to questions, attention normal.  Behavior appropriate.  Affect normal.  Judgment and insight appear normal to me, without significant memory loss.     Labs on Admission:  I have personally reviewed following labs and imaging studies: CBC: Recent Labs  Lab 01/08/21 0955  WBC 3.2*  NEUTROABS 1.5*  HGB 7.7*  HCT 23.6*  MCV 77.4*  PLT 97*   Basic Metabolic Panel: Recent Labs  Lab 01/08/21 0955  NA 142  K 4.0  CL 110  CO2 21*  GLUCOSE 90  BUN 30*  CREATININE 1.73*  CALCIUM 9.3    GFR: Estimated Creatinine Clearance: 22.2 mL/min (A) (by C-G formula based on SCr of 1.73 mg/dL (H)).  Liver Function Tests: Recent Labs  Lab 01/08/21 0955  AST 20  ALT 10  ALKPHOS 135*  BILITOT 0.3  PROT 7.4  ALBUMIN 3.9   Anemia Panel: Recent Labs    01/08/21 0954  FERRITIN 25  TIBC 319  IRON 53          Assessment/Plan   Anemia of renal disease and myelodysplasia and likely GI blood loss Melena  The patient has mild MDS and renal anemia followed by Dr. Alen Blew, transfusion dependent for the last 4-6 months.    In the last month, she has had accelerating melena, and last night had a large melenic stool.  Low dose aspirin only, no anticoagulation and no NSAIDs.  Here, her Hgb is close to her baseline, and she is hemodynamically stable; as such, I do not think she is actively bleeding, nor requires IV PPI. -Start oral PPI BID -COnsult GI Dr. Silverio Decamp, appreciate cares -NPO at midnight for possible EGD tomorrow    Pancytopenia Mild thrombocytopenia and leukopenia in setting of MDS diagnosed by her Hematologist.    Reivew of her results tab shows a clear Hgb trend DOWN since 2020.   -Recommend close interval follow up with Dr. Alen Blew after discharge     Hypertension -Continue amlodipine and labetalol  Hypothyroidism  -Continue levothyroxine  CKD IIIb  Cr at baseline 1.5-1.7  Gout  -Continue allopurinol  Dementia  -Continue mirtazapine, namenda, aricept    ADDENDUM: Incidental COVID positive Asymptomatic, no hypoxia. -Obtain CXR -Patient's preference is to avoid less researched therapies with possible risks of anaphylaxis, which I think includes bebtelovimab, which we have very little experience with here; she also has no symptoms and I believe even remdesivir 3 days is not warranted  -If CXR has findings, will revisit remdesivir         DVT prophylaxis: SCDs given ?slow bleed  Code Status: FULL, discsused with son, POA  Family  Communication: Son by phone  Disposition Plan: Anticipate observation overnight.  If bleeding does not accelerate and  Hgb is stable in AM, will attempt EGD and discharge home after pending results.  If EGD is precluded by endoscopy suite timing, but patient has no furhter bleeding or hemodynamic instability, will discharge home with close outpatient GI follow up pending GI recommendations.   Consults called: GI Dr. Silverio Decamp Admission status: OBS   At the point of initial evaluation, it is my clinical opinion that admission for OBSERVATION is reasonable and necessary because the patient's presenting complaints in the context of their chronic conditions represent sufficient risk of deterioration or significant morbidity to constitute reasonable grounds for close observation in the hospital setting, but that the patient may be medically stable for discharge from the hospital within 24 to 48 hours.    Medical decision making: Patient seen at 3:37 PM on 01/08/2021.  The patient was discussed with Carmon Sails, Dr. Silverio Decamp, Alonza Bogus.  What exists of the patient's chart was reviewed in depth and summarized above.  Clinical condition: stable.        [facility] Please page though [place] or Epic secure chat:  For password, contact charge nurse

## 2021-01-08 NOTE — H&P (View-Only) (Signed)
Referring Provider:  [facility] Primary Care Physician:  Sonia Side., [name] Primary Gastroenterologist:  Dr. Silverio Decamp  Reason for Consultation:  Anemia and heme positive stools  HPI: Susan [name] is a 80 y.o. female with PMH of chronic, multifactorial anemia who follows with hematology.  Receives Aranesp weekly.  Also has myelodysplasia, HTN, HLD, hypothyroidism.  Reports black stools for the past month with worsening intensity, particularly last evening when she had 3 rather large black stools.  Heme positive here.  Hgb 7.7 grams, which is about her baseline over the past several months.  Iron studies borderline low.  Had unremarkable colonoscopy in 2018.  Says that she had an EGD in [place] several years ago.  She denies abdominal pain.  No heartburn or reflux.  No dysphagia.  Says that she used to be on medicine for GERD but just avoids foods that tend to give her symptoms and she does not have any issues.  Diclofenac is on her medication list but she is unsure if she is taking it.  Colonoscopy 02/10/2017 showed pancolonic diverticulosis and internal hemorrhoids.  Past Medical History:  Diagnosis Date  . Anemia   . Arthritis   . Blood transfusion without reported diagnosis   . GERD (gastroesophageal reflux disease)   . HLD (hyperlipidemia)   . Hypertension   . Hypothyroidism   . Memory loss   . Myelodysplasia   . Vitamin D deficiency     Past Surgical History:  Procedure Laterality Date  . CARPAL TUNNEL RELEASE Right   . DILATION AND CURETTAGE OF UTERUS    . INGUINAL HERNIA REPAIR    . ROTATOR CUFF REPAIR Left   . TRANSANAL HEMORRHOIDAL DEARTERIALIZATION N/A 03/31/2017   Procedure: TRANSANAL HEMORRHOIDAL DEARTERIALIZATION;  Surgeon: Leighton Ruff, MD;  Location: [facility];  Service: General;  Laterality: N/A;  . UMBILICAL HERNIA REPAIR      Prior to Admission medications   Medication Sig Start Date End Date Taking? Authorizing Provider  acetaminophen (TYLENOL) 500 MG tablet  Take 500-1,000 mg by mouth every 6 (six) hours as needed (for pain.).    [provider]  allopurinol (ZYLOPRIM) 300 MG tablet Take 300 mg by mouth daily. 07/20/20   [provider]  amLODipine (NORVASC) 10 MG tablet TAKE 1/2 TABLET BY MOUTH DAILY AT BEDTIME; (TAKE 1 TABLET IF SBP IS >150) Patient taking differently: Take 10 mg by mouth at bedtime. (TAKE 1 TABLET IF SBP IS >150) 07/20/17   [name], NP  aspirin 81 MG chewable tablet Chew 81 mg by mouth daily.     [provider]  Cholecalciferol (VITAMIN D-3) 5000 UNITS TABS Take 15,000 Units by mouth daily.     [provider]  Darbepoetin Alfa 300 MCG/ML SOLN Inject 300 mcg into the skin See admin instructions. USE INJECTION EVERY 3 WEEKS AS NEEDED FOR HEMOGLOBIN LESS THAN 11.    [provider]  diclofenac (VOLTAREN) 75 MG EC tablet TAKE 1 TABLET(75 MG) BY MOUTH TWICE DAILY 08/22/18   [name], NP  diphenhydrAMINE (BENADRYL) 25 mg capsule One capsule every 6 hours x 3 days, then as needed for symptoms of rash/itching 03/25/17   Charlann Lange, PA-C  donepezil (ARICEPT) 10 MG tablet Take 10 mg by mouth daily. 07/20/20   [provider]  HYDROcodone-acetaminophen (NORCO/VICODIN) 5-325 MG tablet Take 1 tablet by mouth every 6 (six) hours as needed for moderate pain. 03/22/18   [name], NP  hydroxypropyl methylcellulose / hypromellose (ISOPTO TEARS /  GONIOVISC) 2.5 % ophthalmic solution Place 1-2 drops into both eyes 3 (three) times daily as needed for dry eyes.    [provider]  labetalol (NORMODYNE) 200 MG tablet Take 200 mg by mouth 2 (two) times daily. 06/23/20   [provider]  labetalol (NORMODYNE) 300 MG tablet TAKE 1 TABLET BY MOUTH TWICE DAILY 10/06/18   Unk Pinto, MD  levothyroxine (SYNTHROID) 100 MCG tablet Take 1 tablet daily on an empty stomach with only water for 30 minutes & no Antacid meds, Calcium or Magnesium for 4 hours & avoid Biotin  10/14/19   Unk Pinto, MD  Magnesium 500 MG TABS Take 500 mg by mouth every evening.    [provider]  memantine (NAMENDA) 10 MG tablet Take 1 tablet (10 mg total) by mouth 2 (two) times daily. 11/30/20   [name], NP  mirtazapine (REMERON) 15 MG tablet Take 15 mg by mouth at bedtime. 07/20/20   [provider]  OMEPRAZOLE PO Take 1 tablet by mouth as needed.     [provider]  pantoprazole (PROTONIX) 40 MG tablet TK 1 T PO QD FOR ACID REFLUX 05/25/18   [provider]    No current facility-administered medications for this encounter.    Allergies as of 01/08/2021 - Review Complete 01/08/2021  Allergen Reaction Noted  . Codeine Anaphylaxis 12/28/2011  . Penicillins Anaphylaxis 12/28/2011  . Bactrim Itching and Swelling 12/28/2011  . Clarithromycin Other (See Comments) 12/28/2011  . Flagyl [metronidazole hcl] Itching and Swelling 12/28/2011  . Food Other (See Comments) 03/24/2017  . Pineapple Itching and Swelling 03/24/2017  . Sulfa antibiotics Itching and Swelling 12/28/2011    Family History  Problem Relation Age of Onset  . Stroke Father   . Hypertension Father   . Other Mother        died at early age  . Stroke Sister   . Coronary artery disease Brother   . Hypertension Sister   . Bladder Cancer Sister   . Colon cancer Neg Hx   . Stomach cancer Neg Hx     Social History   Socioeconomic History  . Marital status: Single    Spouse name: Not on file  . Number of children: 1  . Years of education: college  . Highest education level: Bachelor's degree (e.g., BA, AB, BS)  Occupational History  . Occupation: retired    Fish farm manager: RETIRED  Tobacco Use  . Smoking status: Former Smoker    Types: Cigarettes    Quit date: 10/02/1996    Years since quitting: 24.2  . Smokeless tobacco: Never Used  Vaping Use  . Vaping Use: Never used  Substance and Sexual Activity  . Alcohol use: Yes    Comment: glass of wine at least  once a month.  . Drug use: No  . Sexual activity: Not Currently    Partners: Male  Other Topics Concern  . Not on file  Social History Narrative   Lives at home alone.   Right-handed.   No daily use of caffeine.   Social Determinants of Health   Financial Resource Strain: Not on file  Food Insecurity: Not on file  Transportation Needs: Not on file  Physical Activity: Not on file  Stress: Not on file  Social Connections: Not on file  Intimate Partner Violence: Not on file    Review of Systems: ROS is O/W negative except as mentioned in HPI.  Physical Exam: Vital signs in last 24 hours: Temp:  [  97.9 F (36.6 C)-98.2 F (36.8 C)] 98 F (36.7 C) (04/08 1529) Pulse Rate:  [53-67] 59 (04/08 1529) Resp:  [16-19] 18 (04/08 1529) BP: (115-147)/(63-113) 123/81 (04/08 1529) SpO2:  [98 %-100 %] 100 % (04/08 1529) Weight:  [62.6 kg] 62.6 kg (04/08 1137)   General:  Alert, Well-developed, well-nourished, pleasant and cooperative in NAD Head:  Normocephalic and atraumatic. Eyes:  Sclera clear, no icterus.  Conjunctiva pink. Ears:  Normal auditory acuity. Mouth:  No deformity or lesions.   Lungs:  Clear throughout to auscultation.  No wheezes, crackles, or rhonchi.  Heart:  Regular rate and rhythm; no murmurs, clicks, rubs, or gallops. Abdomen:  Soft, non-distended.  BS present.  Non-tender. Rectal:  Deferred.  Heme positive in the ED.  Msk:  Symmetrical without gross deformities. Pulses:  Normal pulses noted. Extremities:  Without clubbing or edema. Neurologic:  Alert and oriented x 4;  grossly normal neurologically. Skin:  Intact without significant lesions or rashes. Psych:  Alert and cooperative. Normal mood and affect.  Lab Results: Recent Labs    01/08/21 0955  WBC 3.2*  HGB 7.7*  HCT 23.6*  PLT 97*   BMET Recent Labs    01/08/21 0955  NA 142  K 4.0  CL 110  CO2 21*  GLUCOSE 90  BUN 30*  CREATININE 1.73*  CALCIUM 9.3   LFT Recent Labs     01/08/21 0955  PROT 7.4  ALBUMIN 3.9  AST 20  ALT 10  ALKPHOS 135*  BILITOT 0.3   IMPRESSION:  -80 year old female with chronic, multifactorial anemia who follows with hematology.  Receives Aranesp weekly.  Reports black stools for the past month with worsening intensity, particularly last evening.  Heme positive here.  Hgb 7.7 grams, which is about her baseline over the past several months.  Iron studies borderline low.  Had unremarkable colonoscopy in 2018.  EGD several years ago in [place].  Diclofenac is on her list but she is unsure if she is taking it.    PLAN: -Ok for diet today then NPO after midnight. -Tentative plan is for EGD on 4/9. -Monitor Hgb and transfuse if needed. -Pantoprazole 40 mg PO BID.   Laban Emperor. Zehr  01/08/2021, 3:31 PM   Attending physician's note   I have taken a history, examined the patient and reviewed the chart. I agree with the Advanced Practitioner's note, impression and recommendations.  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  She has had on and off melena in the past but she feels it is worse now She has received 4 PRBC transfusions in the past 6 months  She is current with colorectal cancer screening, last colonoscopy in 2018 with no polyps or mass lesions.  She is Covid positive but asymptomatic  Monitor hemoglobin and transfuse if below 7  We will plan for EGD tomorrow, Dr. Ardis Hughs will reevaluate in the a.m. N.p.o. after midnight Continued pantoprazole 40 mg twice daily  The risks and benefits as well as alternatives of endoscopic procedure(s) have been discussed and reviewed. All questions answered. The patient agrees to proceed.    The patient was provided an opportunity to ask questions and all were answered. The patient agreed with the plan and  demonstrated an understanding of the instructions.  Damaris Hippo , MD [phone]

## 2021-01-09 ENCOUNTER — Observation Stay (HOSPITAL_COMMUNITY): Payer: Medicare Other | Admitting: Anesthesiology

## 2021-01-09 ENCOUNTER — Encounter (HOSPITAL_COMMUNITY)
Admission: EM | Disposition: A | Discharge status: Home / Self Care | Payer: Self-pay | Source: Home / Self Care | Attending: Emergency Medicine

## 2021-01-09 ENCOUNTER — Encounter (HOSPITAL_COMMUNITY): Payer: Self-pay | Admitting: Family Medicine

## 2021-01-09 DIAGNOSIS — K2970 Gastritis, unspecified, without bleeding: Secondary | ICD-10-CM | POA: Diagnosis not present

## 2021-01-09 DIAGNOSIS — N189 Chronic kidney disease, unspecified: Secondary | ICD-10-CM | POA: Diagnosis not present

## 2021-01-09 DIAGNOSIS — N1832 Chronic kidney disease, stage 3b: Secondary | ICD-10-CM | POA: Diagnosis not present

## 2021-01-09 DIAGNOSIS — I129 Hypertensive chronic kidney disease with stage 1 through stage 4 chronic kidney disease, or unspecified chronic kidney disease: Secondary | ICD-10-CM | POA: Diagnosis not present

## 2021-01-09 DIAGNOSIS — D500 Iron deficiency anemia secondary to blood loss (chronic): Secondary | ICD-10-CM

## 2021-01-09 DIAGNOSIS — E039 Hypothyroidism, unspecified: Secondary | ICD-10-CM

## 2021-01-09 DIAGNOSIS — K921 Melena: Secondary | ICD-10-CM | POA: Diagnosis not present

## 2021-01-09 DIAGNOSIS — U071 COVID-19: Secondary | ICD-10-CM | POA: Diagnosis not present

## 2021-01-09 DIAGNOSIS — I679 Cerebrovascular disease, unspecified: Secondary | ICD-10-CM | POA: Diagnosis not present

## 2021-01-09 DIAGNOSIS — D469 Myelodysplastic syndrome, unspecified: Secondary | ICD-10-CM

## 2021-01-09 DIAGNOSIS — F0390 Unspecified dementia without behavioral disturbance: Secondary | ICD-10-CM

## 2021-01-09 DIAGNOSIS — D631 Anemia in chronic kidney disease: Secondary | ICD-10-CM

## 2021-01-09 DIAGNOSIS — I10 Essential (primary) hypertension: Secondary | ICD-10-CM

## 2021-01-09 HISTORY — PX: BIOPSY: SHX5522

## 2021-01-09 HISTORY — PX: ESOPHAGOGASTRODUODENOSCOPY (EGD) WITH PROPOFOL: SHX5813

## 2021-01-09 LAB — CBC
HCT: 22.5 % — ABNORMAL LOW (ref 36.0–46.0)
Hemoglobin: 7.1 g/dL — ABNORMAL LOW (ref 12.0–15.0)
MCH: 24.9 pg — ABNORMAL LOW (ref 26.0–34.0)
MCHC: 31.6 g/dL (ref 30.0–36.0)
MCV: 78.9 fL — ABNORMAL LOW (ref 80.0–100.0)
Platelets: 99 10*3/uL — ABNORMAL LOW (ref 150–400)
RBC: 2.85 MIL/uL — ABNORMAL LOW (ref 3.87–5.11)
RDW: 20.6 % — ABNORMAL HIGH (ref 11.5–15.5)
WBC: 4 10*3/uL (ref 4.0–10.5)
nRBC: 0 % (ref 0.0–0.2)

## 2021-01-09 LAB — BASIC METABOLIC PANEL
Anion gap: 7 (ref 5–15)
BUN: 32 mg/dL — ABNORMAL HIGH (ref 8–23)
CO2: 23 mmol/L (ref 22–32)
Calcium: 9.3 mg/dL (ref 8.9–10.3)
Chloride: 111 mmol/L (ref 98–111)
Creatinine, Ser: 1.51 mg/dL — ABNORMAL HIGH (ref 0.44–1.00)
GFR, Estimated: 35 mL/min — ABNORMAL LOW (ref >60)
Glucose, Bld: 82 mg/dL (ref 70–99)
Potassium: 4 mmol/L (ref 3.5–5.1)
Sodium: 141 mmol/L (ref 135–145)

## 2021-01-09 LAB — PREPARE RBC (CROSSMATCH)

## 2021-01-09 SURGERY — ESOPHAGOGASTRODUODENOSCOPY (EGD) WITH PROPOFOL
Anesthesia: Monitor Anesthesia Care

## 2021-01-09 MED ORDER — LACTATED RINGERS IV SOLN: INTRAVENOUS | Status: DC

## 2021-01-09 MED ORDER — SODIUM CHLORIDE 0.9% IV SOLUTION: Freq: Once | INTRAVENOUS | Status: AC

## 2021-01-09 MED ORDER — SODIUM CHLORIDE 0.9 % IV SOLN: INTRAVENOUS | Status: DC

## 2021-01-09 MED ORDER — PROPOFOL 10 MG/ML IV BOLUS
INTRAVENOUS | Status: DC | PRN
  Administered 2021-01-09: 250 ug/kg/min via INTRAVENOUS

## 2021-01-09 MED ORDER — LACTATED RINGERS IV SOLN: INTRAVENOUS | Status: DC | PRN

## 2021-01-09 SURGICAL SUPPLY — 15 items

## 2021-01-09 NOTE — Anesthesia Postprocedure Evaluation (Signed)
Anesthesia Post Note  Patient: Susan [name]  Procedure(s) Performed: ESOPHAGOGASTRODUODENOSCOPY (EGD) WITH PROPOFOL (N/A ) BIOPSY     Patient location during evaluation: Endoscopy Anesthesia Type: MAC Level of consciousness: awake and alert Pain management: pain level controlled Vital Signs Assessment: post-procedure vital signs reviewed and stable Respiratory status: spontaneous breathing, nonlabored ventilation, respiratory function stable and patient connected to nasal cannula oxygen Cardiovascular status: stable and blood pressure returned to baseline Postop Assessment: no apparent nausea or vomiting Anesthetic complications: no   No complications documented.  Last Vitals:  Vitals:   01/09/21 1128 01/09/21 1354  BP: 114/69 129/67  Pulse: (!) 56 (!) 58  Resp: 17 15  Temp: 36.5 C 37 C  SpO2: 100% 100%    Last Pain:  Vitals:   01/09/21 1354  TempSrc: Oral  PainSc:                  Mizael Sagar

## 2021-01-09 NOTE — Anesthesia Preprocedure Evaluation (Addendum)
Anesthesia Evaluation  Patient identified by MRN, date of birth, ID band Patient awake    Reviewed: Allergy & Precautions, NPO status , Patient's Chart, lab work & pertinent test results  History of Anesthesia Complications Negative for: history of anesthetic complications  Airway Mallampati: II  TM Distance: >3 FB Neck ROM: Full    Dental  (+) Edentulous Upper, Edentulous Lower   Pulmonary neg shortness of breath, neg COPD, neg recent URI, former smoker,  Covid-19 Nucleic Acid Test Results Lab Results      Component                Value               Date                      SARSCOV2NAA              POSITIVE (A)        01/08/2021              breath sounds clear to auscultation       Cardiovascular hypertension, Pt. on medications  Rhythm:Regular  1. Left ventricular ejection fraction, by estimation, is 60 to 65%. The  left ventricle has normal function. The left ventricle has no regional  wall motion abnormalities. Left ventricular diastolic parameters were  normal.  2. Right ventricular systolic function is normal. The right ventricular  size is normal. Tricuspid regurgitation signal is inadequate for assessing  PA pressure.  3. The mitral valve is normal in structure. Trivial mitral valve  regurgitation. No evidence of mitral stenosis.  4. The aortic valve is normal in structure. Aortic valve regurgitation is  trivial. No aortic stenosis is present.  5. The inferior vena cava is dilated in size with <50% respiratory  variability, suggesting right atrial pressure of 15 mmHg.    Neuro/Psych PSYCHIATRIC DISORDERS Dementia negative neurological ROS     GI/Hepatic GERD  Medicated,?gi bleed as source of acute anemia   Endo/Other  Hypothyroidism   Renal/GU Renal diseaseLab Results      Component                Value               Date                      CREATININE               1.51 (H)            01/09/2021           Lab Results      Component                Value               Date                      K                        4.0                 01/09/2021                Musculoskeletal  (+) Arthritis ,   Abdominal   Peds  Hematology  (+) Blood dyscrasia, anemia , Lab Results      Component  Value               Date                      WBC                      4.0                 01/09/2021                HGB                      7.1 (L)             01/09/2021                HCT                      22.5 (L)            01/09/2021                MCV                      78.9 (L)            01/09/2021                PLT                      99 (L)              01/09/2021          '   Anesthesia Other Findings   Reproductive/Obstetrics                           Anesthesia Physical Anesthesia Plan  ASA: III  Anesthesia Plan: MAC   Post-op Pain Management:    Induction: Intravenous  PONV Risk Score and Plan: 2 and Propofol infusion and Treatment may vary due to age or medical condition  Airway Management Planned: Nasal Cannula  Additional Equipment: None  Intra-op Plan:   Post-operative Plan:   Informed Consent: I have reviewed the patients History and Physical, chart, labs and discussed the procedure including the risks, benefits and alternatives for the proposed anesthesia with the patient or authorized representative who has indicated his/her understanding and acceptance.     Dental advisory given  Plan Discussed with: CRNA  Anesthesia Plan Comments:         Anesthesia Quick Evaluation

## 2021-01-09 NOTE — Op Note (Signed)
[facility] Patient Name: Susan [name] Procedure Date: 01/09/2021 MRN: [id number] Attending MD: Milus Banister , MD Date of Birth: 1941-07-31 CSN: [id number] Age: 80 Admit Type: Inpatient Procedure:                Upper GI endoscopy Indications:              dark stools recent, chronic multifactorial anemia,                            Hb 7.7 yesterday which is around her usual (8-9                            range) Providers:                Milus Banister, MD, Grace Isaac, RN, William Dalton, Technician Referring MD:              Medicines:                Monitored Anesthesia Care Complications:            No immediate complications. Estimated blood loss:                            None. Estimated Blood Loss:     Estimated blood loss: none. Procedure:                Pre-Anesthesia Assessment:                           - Prior to the procedure, a History and Physical                            was performed, and patient medications and                            allergies were reviewed. The patient's tolerance of                            previous anesthesia was also reviewed. The risks                            and benefits of the procedure and the sedation                            options and risks were discussed with the patient.                            All questions were answered, and informed consent                            was obtained. Prior Anticoagulants: The patient has                            taken no previous  anticoagulant or antiplatelet                            agents. ASA Grade Assessment: III - A patient with                            severe systemic disease. After reviewing the risks                            and benefits, the patient was deemed in                            satisfactory condition to undergo the procedure.                           After obtaining informed consent, the endoscope was                             passed under direct vision. Throughout the                            procedure, the patient's blood pressure, pulse, and                            oxygen saturations were monitored continuously. The                            [id number]-[id number] ([id number]) was introduced through the                            mouth, and advanced to the second part of duodenum.                            The upper GI endoscopy was accomplished without                            difficulty. The patient tolerated the procedure                            well. Scope In: Scope Out: Findings:      The GE junction was friable in a spoke like pattern, and there was a       small amount of red blood at the site.      Mild inflammation characterized by erythema, friability and granularity       was found in the entire examined stomach. Biopsies were taken with a       cold forceps for histology.      The exam was otherwise without abnormality. Impression:               - The GE junction was friable in a spoke like                            pattern, and there was a small amount of red blood  at the site. This may represent reflux esophagitis.                           - Mild, non-specific gastritis. Biopsied to check                            for H. pylori Moderate Sedation:      Not Applicable - Patient had care per Anesthesia. Recommendation:           - Return patient to [facility] for ongoing care.                           - I will order solid food.                           - Unclear etiology of her dark stools recently but                            her admitting Hb was pretty close to her usual. She                            had colonoscopy 4 years ago and I don't think that                            needs to be repeated now unless she has more                            obvious, persistent GI bleeding.                           - Will change PPI to BID orally  and repeat CBC                            tomorrow AM. If no overt, convincing GI bleeding                            then she is probably OK for d/c tomorrow. Procedure Code(s):        --- Professional ---                           [phone], Esophagogastroduodenoscopy, flexible,                            transoral; with biopsy, single or multiple Diagnosis Code(s):        --- Professional ---                           K29.70, Gastritis, unspecified, without bleeding                           K92.1, Melena (includes Hematochezia) CPT copyright 2019 [employer]. All rights reserved. The codes documented in this report are preliminary and upon coder review may  be revised to meet current compliance requirements. Melene Plan  Ardis Hughs, MD 01/09/2021 10:56:30 AM This report has been signed electronically. Number of Addenda: 0

## 2021-01-09 NOTE — Progress Notes (Signed)
PROGRESS NOTE    Susan [name]  BOF:[id number] DOB: 11/10/1940 DOA: 01/08/2021 PCP: Sonia Side., [name]   Brief Narrative:  Susan [name] is a 80 y.o. F  from home with history of hypothyroidism, HTN, CKD IIIb baseline Cr 1.5-1.7, gout, and chronic anemia of chronic disease in the setting of CKD plus MDS who [facility] for routine Aranesp infusion, incidentally noted 1 month progressive melena to nursing there, and was sent to the ER. Patient has no history of GI bleed or ulcers, but has hx advanced diverticulosis and did have an episode a few years ago of self-limited "dark stools" that resolved without intervention.  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  Patient's dark stools transition from occasional to daily over the past few weeks and patient notes worsening black more watery diarrhea over the past few days.  In the ED FOBT was noted to be positive, GI was called for consult and hospitalist was called for admission.  Assessment & Plan:   Principal Problem:   Anemia Active Problems:   Essential hypertension   Hypothyroidism   CKD, 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)   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HCC)   Anemia of renal disease   Dementia without behavioral disturbance (HCC)   Cerebral vascular disease   Anemia of renal disease and myelodysplasia and likely GI blood loss Melena  - Known history of CKD 3B and MDS being followed by hematology with Dr. Alen Blew, transfusion dependent for the last 4-6 months.   - EGD pending this morning - Transfuse 1 unit PRBC given downtrending hemoglobin currently 7.1 - follow am labs - Continue PPI   Pancytopenia - In the setting of MDS followed by Dr. Alen Blew her hematologist -outpatient follow-up as scheduled  Hypertension -Well-controlled on home medications: amlodipine and  labetalol  Hypothyroidism  -Continue levothyroxine  CKD IIIb  - Cr at baseline 1.5-1.7  Gout  -Continue allopurinol  Dementia  -Continue mirtazapine, namenda, aricept  Incidental COVID positive - Asymptomatic, no hypoxia. -Chest x-ray without opacifications -Continue to hold any treatment given if symptomatic, possibly resolving/previous infection  DVT prophylaxis: SCDs only given concern for ongoing acute bleeding as above  Code Status: FULL  Family Communication: Son to be updated by patient  Status is: Inpatient  Dispo: The patient is from: Home              Anticipated d/c is to: Home              Anticipated d/c date is: 24 to 48 hours              Patient currently not medically stable for discharge given ongoing need for transfusion evaluation and possible further intervention or imaging.  Consultants:   GI  Procedures:   Upper endoscopy  Antimicrobials:  None  Subjective: No acute issues or events overnight, patient feels quite well but somewhat fatigued denies nausea vomiting diarrhea constipation headache fevers chills chest pain or shortness of breath.  Objective: Vitals:   01/08/21 1503 01/08/21 1529 01/08/21 1903 01/09/21 0609  BP: 129/70 123/81 123/62 110/64  Pulse: 66 (!) 59 62 (!) 59  Resp: 18 18 18 18   Temp:  98 F (36.7 C) 98.3 F (36.8 C) 98.3 F (36.8 C)  TempSrc:  Oral Oral   SpO2: 100% 100% 100% 100%  Weight:  Height:        Intake/Output Summary (Last 24 hours) at 01/09/2021 0724 Last data filed at 01/08/2021 1737 Gross per 24 hour  Intake 240 ml  Output --  Net 240 ml   Filed Weights   01/08/21 1137  Weight: 62.6 kg    Examination:  General:  Pleasantly resting in bed, No acute distress. HEENT:  Normocephalic atraumatic.  Sclerae nonicteric, noninjected.  Extraocular movements intact bilaterally. Neck:  Without mass or deformity.  Trachea is midline. Lungs:  Clear to auscultate bilaterally without rhonchi,  wheeze, or rales. Heart:  Regular rate and rhythm.  Without murmurs, rubs, or gallops. Abdomen:  Soft, nontender, nondistended.  Without guarding or rebound. Extremities: Without cyanosis, clubbing, edema, or obvious deformity. Vascular:  Dorsalis pedis and posterior tibial pulses palpable bilaterally. Skin:  Warm and dry, no erythema, no ulcerations.   Data Reviewed: I have personally reviewed following labs and imaging studies  CBC: Recent Labs  Lab 01/08/21 0955 01/09/21 0315  WBC 3.2* 4.0  NEUTROABS 1.5*  --   HGB 7.7* 7.1*  HCT 23.6* 22.5*  MCV 77.4* 78.9*  PLT 97* 99*   Basic Metabolic Panel: Recent Labs  Lab 01/08/21 0955 01/09/21 0315  NA 142 141  K 4.0 4.0  CL 110 111  CO2 21* 23  GLUCOSE 90 82  BUN 30* 32*  CREATININE 1.73* 1.51*  CALCIUM 9.3 9.3   GFR: Estimated Creatinine Clearance: 25.4 mL/min (A) (by C-G formula based on SCr of 1.51 mg/dL (H)). Liver Function Tests: Recent Labs  Lab 01/08/21 0955  AST 20  ALT 10  ALKPHOS 135*  BILITOT 0.3  PROT 7.4  ALBUMIN 3.9   No results for input(s): LIPASE, AMYLASE in the last 168 hours. No results for input(s): AMMONIA in the last 168 hours. Coagulation Profile: No results for input(s): INR, PROTIME in the last 168 hours. Cardiac Enzymes: No results for input(s): CKTOTAL, CKMB, CKMBINDEX, TROPONINI in the last 168 hours. BNP (last 3 results) No results for input(s): PROBNP in the last 8760 hours. HbA1C: No results for input(s): HGBA1C in the last 72 hours. CBG: No results for input(s): GLUCAP in the last 168 hours. Lipid Profile: No results for input(s): CHOL, HDL, LDLCALC, TRIG, CHOLHDL, LDLDIRECT in the last 72 hours. Thyroid Function Tests: No results for input(s): TSH, T4TOTAL, FREET4, T3FREE, THYROIDAB in the last 72 hours. Anemia Panel: Recent Labs    01/08/21 0954  FERRITIN 25  TIBC 319  IRON 53   Sepsis Labs: No results for input(s): PROCALCITON, LATICACIDVEN in the last 168  hours.  Recent Results (from the past 240 hour(s))  Resp Panel by RT-PCR (Flu A&B, Covid) Nasopharyngeal Swab     Status: Abnormal   Collection Time: 01/08/21  2:00 PM   Specimen: Nasopharyngeal Swab; Nasopharyngeal(NP) swabs in vial transport medium  Result Value Ref Range Status   SARS Coronavirus 2 by RT PCR POSITIVE (A) NEGATIVE Final    Comment: RESULT CALLED TO, READ BACK BY AND VERIFIED WITH: Clearence Cheek. RN @1621  ON 04.08.2022 BY COHEN,K (NOTE) SARS-CoV-2 target nucleic acids are DETECTED.  The SARS-CoV-2 RNA is generally detectable in upper respiratory specimens during the acute phase of infection. Positive results are indicative of the presence of the identified virus, but do not rule out bacterial infection or co-infection with other pathogens not detected by the test. Clinical correlation with patient history and other diagnostic information is necessary to determine patient infection status. The expected result is Negative.  Fact Sheet for  Patients: EntrepreneurPulse.com.au  Fact Sheet for Healthcare Providers: IncredibleEmployment.be  This test is not yet approved or cleared by the [place] FDA and  has been authorized for detection and/or diagnosis of SARS-CoV-2 by FDA under an Emergency Use Authorization (EUA).  This EUA will remain in effect (meaning this  test can be used) for the duration of  the COVID-19 declaration under Section 564(b)(1) of the Act, 21 U.S.C. section 360bbb-3(b)(1), unless the authorization is terminated or revoked sooner.     Influenza A by PCR NEGATIVE NEGATIVE Final   Influenza B by PCR NEGATIVE NEGATIVE Final    Comment: (NOTE) The Xpert Xpress SARS-CoV-2/FLU/RSV plus assay is intended as an aid in the diagnosis of influenza from Nasopharyngeal swab specimens and should not be used as a sole basis for treatment. Nasal washings and aspirates are unacceptable for Xpert Xpress  SARS-CoV-2/FLU/RSV testing.  Fact Sheet for Patients: EntrepreneurPulse.com.au  Fact Sheet for Healthcare Providers: IncredibleEmployment.be  This test is not yet approved or cleared by the [place] FDA and has been authorized for detection and/or diagnosis of SARS-CoV-2 by FDA under an Emergency Use Authorization (EUA). This EUA will remain in effect (meaning this test can be used) for the duration of the COVID-19 declaration under Section 564(b)(1) of the Act, 21 U.S.C. section 360bbb-3(b)(1), unless the authorization is terminated or revoked.  Performed at [facility], [place] [address] [place], [place] [address]          Radiology Studies: DG CHEST PORT 1 VIEW  Result Date: 01/08/2021 CLINICAL DATA:  COVID-19. EXAM: PORTABLE CHEST 1 VIEW COMPARISON:  09/05/2017 FINDINGS: Mild cardiomegaly. Unchanged mediastinal contours with aortic atherosclerosis and tortuosity. Linear retrocardiac opacities typical of atelectasis or scarring. No focal airspace disease suspicious for COVID 19 pneumonia. No pulmonary edema, large pleural effusion or pneumothorax. The bones are under mineralized. Surgical anchor in left proximal humerus. IMPRESSION: 1. Mild cardiomegaly.  Aortic Atherosclerosis (ICD10-I70.0). 2. Linear retrocardiac opacities typical of atelectasis or scarring. No findings to suggest COVID-19 pneumonia. Electronically Signed   By: Keith Rake M.D.   On: 01/08/2021 17:40        Scheduled Meds: . allopurinol  300 mg Oral Daily  . amLODipine  10 mg Oral QHS  . donepezil  10 mg Oral Daily  . labetalol  200 mg Oral BID  . levothyroxine  100 mcg Oral Q0600  . memantine  10 mg Oral BID  . mirtazapine  15 mg Oral QHS  . pantoprazole  40 mg Oral BID AC   Continuous Infusions:   LOS: 0 days    Time spent: 25min    Sharad Vaneaton C Irais Mottram, DO [facility]  If 7PM-7AM, please contact  night-coverage [email or web address]  01/09/2021, 7:24 AM

## 2021-01-09 NOTE — Interval H&P Note (Signed)
History and Physical Interval Note:  01/09/2021 10:02 AM  Susan [name]  has presented today for surgery, with the diagnosis of Anemia and black stools.  The various methods of treatment have been discussed with the patient and family. After consideration of risks, benefits and other options for treatment, the patient has consented to  Procedure(s): ESOPHAGOGASTRODUODENOSCOPY (EGD) WITH PROPOFOL (N/A) as a surgical intervention.  The patient's history has been reviewed, patient examined, no change in status, stable for surgery.  I have reviewed the patient's chart and labs.  Questions were answered to the patient's satisfaction.     Milus Banister

## 2021-01-09 NOTE — Anesthesia Procedure Notes (Addendum)
Procedure Name: MAC Date/Time: 01/09/2021 10:32 AM Performed by: Lissa Morales, CRNA Pre-anesthesia Checklist: Patient identified, Emergency Drugs available, Suction available and Patient being monitored Patient Re-evaluated:Patient Re-evaluated prior to induction Oxygen Delivery Method: Simple face mask Preoxygenation: pom mask. Placement Confirmation: positive ETCO2

## 2021-01-09 NOTE — Transfer of Care (Signed)
Immediate Anesthesia Transfer of Care Note  Patient: Susan [name] Sitter  Procedure(s) Performed: ESOPHAGOGASTRODUODENOSCOPY (EGD) WITH PROPOFOL (N/A ) BIOPSY  Patient Location: PACU  Anesthesia Type:MAC  Level of Consciousness: sedated and patient cooperative  Airway & Oxygen Therapy: Patient Spontanous Breathing and Patient connected to face mask oxygen  Post-op Assessment: Report given to RN and Post -op Vital signs reviewed and stable  Post vital signs: stable  Last Vitals:  Vitals Value Taken Time  BP    Temp    Pulse    Resp    SpO2      Last Pain:  Vitals:   01/09/21 1024  TempSrc: Temporal  PainSc: 0-No pain         Complications: No complications documented.

## 2021-01-09 NOTE — Evaluation (Signed)
Physical Therapy Evaluation Only Patient Details Name: Susan [name] MRN: [id number] DOB: 1941/06/03 Today's Date: 01/09/2021   History of Present Illness  Susan [name] is a 80 y.o. F PMH: dementia, hypothyroidism, HTN, CKD IIIb, gout, anemia, myelodysplasia, presents from cancer center due to 1 month melena reports. Incidentally asymptomatic covid+ on 01/08/21.    Clinical Impression  Pt ambulating around room without LOB, able to complete sudden stops, speed changes, turns with SPC. Pt denies numbness/tingling throughout BLE. Pt on RA with SpO2 100% and no SOB noted. Pt states at baseline has personal aide who lives near by who comes over daily to "check up on me", but also can cook and clean the house if needed. Pt reports feeling at baseline, no acute PT needs identified. Will sign off at this time.    Follow Up Recommendations No PT follow up    Equipment Recommendations  None recommended by PT    Recommendations for Other Services       Precautions / Restrictions Precautions Precautions: None Restrictions Weight Bearing Restrictions: No      Mobility  Bed Mobility Overal bed mobility: Independent   Transfers Overall transfer level: Independent   Ambulation/Gait Ambulation/Gait assistance: Modified independent (Device/Increase time) Gait Distance (Feet): 50 Feet Assistive device: Straight cane Gait Pattern/deviations: WFL(Within Functional Limits) Gait velocity: decreased   General Gait Details: limited to ambulation in room due to covid+, able to complete turns, direction changes, speed change, sudden stop without LOB while using SPC, no SOB, on RA with SpO2 100%  Stairs            Wheelchair Mobility    Modified Rankin (Stroke Patients Only)       Balance Overall balance assessment: Modified Independent          Pertinent Vitals/Pain Pain Assessment: No/denies pain    Home Living Family/patient expects to be discharged to:: Private  residence Living Arrangements: Alone Available Help at Discharge: Personal care attendant;Available PRN/intermittently Type of Home: Apartment Home Access: Level entry     Home Layout: One level Home Equipment: Cane - single point      Prior Function Level of Independence: Independent with assistive device(s)  Comments: Pt reports using SPC for household and community ambulation when gout in ankle flares, independent with ADLs, has personal aide that assists with household chores as needed, denies falls.     Hand Dominance        Extremity/Trunk Assessment   Upper Extremity Assessment Upper Extremity Assessment: Overall WFL for tasks assessed    Lower Extremity Assessment Lower Extremity Assessment: Overall WFL for tasks assessed (AROM WNL, strength 4+/5, denies numbness/tingling throughout BLE)    Cervical / Trunk Assessment Cervical / Trunk Assessment: Normal  Communication   Communication: No difficulties  Cognition Arousal/Alertness: Awake/alert Behavior During Therapy: WFL for tasks assessed/performed Overall Cognitive Status: Within Functional Limits for tasks assessed     General Comments      Exercises     Assessment/Plan    PT Assessment Patent does not need any further PT services  PT Problem List         PT Treatment Interventions      PT Goals (Current goals can be found in the Care Plan section)  Acute Rehab PT Goals Patient Stated Goal: return home PT Goal Formulation: All assessment and education complete, DC therapy    Frequency     Barriers to discharge        Co-evaluation  AM-PAC PT "6 Clicks" Mobility  Outcome Measure Help needed turning from your back to your side while in a flat bed without using bedrails?: None Help needed moving from lying on your back to sitting on the side of a flat bed without using bedrails?: None Help needed moving to and from a bed to a chair (including a wheelchair)?: None Help  needed standing up from a chair using your arms (e.g., wheelchair or bedside chair)?: None Help needed to walk in hospital room?: None Help needed climbing 3-5 steps with a railing? : None 6 Click Score: 24    End of Session   Activity Tolerance: Patient tolerated treatment well Patient left: in bed;with call bell/phone within reach;with bed alarm set Nurse Communication: Mobility status PT Visit Diagnosis: Other abnormalities of gait and mobility (R26.89)    Time: 2244-9753 PT Time Calculation (min) (ACUTE ONLY): 12 min   Charges:   PT Evaluation $PT Eval Low Complexity: 1 Low           Tori Loryn Haacke PT, DPT 01/09/21, 4:03 PM

## 2021-01-10 DIAGNOSIS — N1832 Chronic kidney disease, stage 3b: Secondary | ICD-10-CM | POA: Diagnosis not present

## 2021-01-10 DIAGNOSIS — D500 Iron deficiency anemia secondary to blood loss (chronic): Secondary | ICD-10-CM | POA: Diagnosis not present

## 2021-01-10 DIAGNOSIS — I679 Cerebrovascular disease, unspecified: Secondary | ICD-10-CM | POA: Diagnosis not present

## 2021-01-10 DIAGNOSIS — N189 Chronic kidney disease, unspecified: Secondary | ICD-10-CM | POA: Diagnosis not present

## 2021-01-10 LAB — TYPE AND SCREEN
ABO/RH(D): O POS
Antibody Screen: NEGATIVE
Unit division: 0

## 2021-01-10 LAB — BPAM RBC
Blood Product Expiration Date: 202204292359
ISSUE DATE / TIME: 202204091615
Unit Type and Rh: 5100

## 2021-01-10 LAB — CBC
HCT: 26.5 % — ABNORMAL LOW (ref 36.0–46.0)
Hemoglobin: 8.8 g/dL — ABNORMAL LOW (ref 12.0–15.0)
MCH: 25.6 pg — ABNORMAL LOW (ref 26.0–34.0)
MCHC: 33.2 g/dL (ref 30.0–36.0)
MCV: 77 fL — ABNORMAL LOW (ref 80.0–100.0)
Platelets: 96 10*3/uL — ABNORMAL LOW (ref 150–400)
RBC: 3.44 MIL/uL — ABNORMAL LOW (ref 3.87–5.11)
RDW: 19.3 % — ABNORMAL HIGH (ref 11.5–15.5)
WBC: 4.2 10*3/uL (ref 4.0–10.5)
nRBC: 0 % (ref 0.0–0.2)

## 2021-01-10 LAB — GLUCOSE, CAPILLARY: Glucose-Capillary: 76 mg/dL (ref 70–99)

## 2021-01-10 MED ORDER — PANTOPRAZOLE SODIUM 40 MG PO TBEC: 40.0000 mg | DELAYED_RELEASE_TABLET | Freq: Two times a day (BID) | ORAL | 0 refills | Status: DC

## 2021-01-10 NOTE — Discharge Summary (Signed)
Physician Discharge Summary  Susan [name] CHY:[id number] DOB: December 30, 1940 DOA: 01/08/2021  PCP: Sonia Side., [name]  Admit date: 01/08/2021 Discharge date: 01/10/2021  Admitted From: Home Disposition: Home  Recommendations for Outpatient Follow-up:  1. Follow up with PCP in 1-2 weeks 2. 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 Patient has no history of GI bleed or ulcers, but has hx advanced diverticulosis and did have an episode a few years ago of self-limited "dark stools" that resolved without intervention.  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  Patient's dark stools transition from occasional to daily over the past few weeks and patient notes worsening black more watery diarrhea over the past few days.  In the ED FOBT was noted to be positive, GI was called for consult and hospitalist was called for admission.  Patient admitted as above with notably low hemoglobin, black stool concerning for upper GI bleed.  Patient had upper endoscopy on 01/09/2021 without overt signs of bleeding but friable, inflamed, granularity noted throughout the stomach, biopsies are currently pending.  Given no overt findings for bleeding and stable hemoglobin after transfusion patient otherwise stable and agreeable for discharge home.  Close follow-up with PCP, hematology and  GI in the next few weeks as scheduled to reevaluate patient's hemoglobin as well as need for further transfusion or evaluation.  Lower endoscopy was considered but given patient's recent history this was deferred and if necessary can be done as an outpatient given her otherwise stable nature.  Patient otherwise stable and agreeable for discharge home, awaiting caretaker for transport.  Discharge Diagnoses:  Principal Problem:   Anemia Active Problems:   Essential hypertension   Hypothyroidism   CKD, 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)   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HCC)   Anemia of renal disease   Dementia without behavioral disturbance (HCC)   Cerebral vascular disease    Discharge Instructions  Discharge Instructions    Call MD for:  extreme fatigue   Complete by: As directed    Diet - low sodium heart healthy   Complete by: As directed    Increase activity slowly   Complete by: As directed      Allergies as of 01/10/2021      Reactions   Codeine Anaphylaxis   Penicillins Anaphylaxis   Bactrim Itching, Swelling   Clarithromycin Other (See Comments)   "Caused skin to peel off my hand."   Flagyl [metronidazole Hcl] Itching, Swelling   Food Other (See Comments)   EATS WHITE RICE ONLY (ALLERGIC REACTION TO YELLOW OR BROWN RICE THAT RESULTED IN HOSPITALIZATION)   Pineapple Itching, Swelling   SWELLING OF EYES   Sulfa Antibiotics Itching, Swelling      Medication List    STOP taking these medications   aspirin 81 MG chewable tablet     TAKE these medications   acetaminophen 500 MG tablet Commonly known as: TYLENOL  Take 500-1,000 mg by mouth every 6 (six) hours as needed (for pain.).   allopurinol 300 MG tablet Commonly known as: ZYLOPRIM Take 300 mg by mouth daily.   amLODipine 10 MG tablet Commonly known as: NORVASC TAKE 1/2 TABLET BY MOUTH DAILY AT BEDTIME; (TAKE 1 TABLET IF SBP IS >150) What changed:   how much to take  how to take this  when to take  this  additional instructions   Darbepoetin Alfa 300 MCG/ML Soln Inject 300 mcg into the skin See admin instructions. USE INJECTION EVERY 3 WEEKS AS NEEDED FOR HEMOGLOBIN LESS THAN 11.   diphenhydrAMINE 25 mg capsule Commonly known as: BENADRYL One capsule every 6 hours x 3 days, then as needed for symptoms of rash/itching   donepezil 10 MG tablet Commonly known as: ARICEPT Take 10 mg by mouth daily.   fluticasone 50 MCG/ACT nasal spray Commonly known as: FLONASE Place 1 spray into both nostrils in the morning and at bedtime.   hydroxypropyl methylcellulose / hypromellose 2.5 % ophthalmic solution Commonly known as: ISOPTO TEARS / GONIOVISC Place 1-2 drops into both eyes 3 (three) times daily as needed for dry eyes.   labetalol 200 MG tablet Commonly known as: NORMODYNE Take 200 mg by mouth 2 (two) times daily.   levothyroxine 75 MCG tablet Commonly known as: SYNTHROID Take 75 mcg by mouth every morning.   memantine 10 MG tablet Commonly known as: Namenda Take 1 tablet (10 mg total) by mouth 2 (two) times daily.   mirtazapine 15 MG tablet Commonly known as: REMERON Take 15 mg by mouth at bedtime.   montelukast 10 MG tablet Commonly known as: SINGULAIR SMARTSIG:1 Tablet(s) By Mouth Every Evening   pantoprazole 40 MG tablet Commonly known as: PROTONIX Take 1 tablet (40 mg total) by mouth 2 (two) times daily before a meal. What changed: See the new instructions.   Vitamin D-3 125 MCG (5000 UT) Tabs Take 15,000 Units by mouth daily.   vitamin E 200 UNIT capsule Take 200 Units by mouth daily.       Allergies  Allergen Reactions  . Codeine Anaphylaxis  . Penicillins Anaphylaxis  . Bactrim Itching and Swelling  . Clarithromycin Other (See Comments)    "Caused skin to peel off my hand."  . Flagyl [Metronidazole Hcl] Itching and Swelling  . Food Other (See Comments)    EATS WHITE RICE ONLY (ALLERGIC REACTION TO YELLOW OR BROWN RICE THAT RESULTED IN  HOSPITALIZATION)  . Pineapple Itching and Swelling    SWELLING OF EYES  . Sulfa Antibiotics Itching and Swelling    Consultations:  GI, Dr. Ardis Hughs   Procedures/Studies: DG CHEST PORT 1 VIEW  Result Date: 01/08/2021 CLINICAL DATA:  COVID-19. EXAM: PORTABLE CHEST 1 VIEW COMPARISON:  09/05/2017 FINDINGS: Mild cardiomegaly. Unchanged mediastinal contours with aortic atherosclerosis and tortuosity. Linear retrocardiac opacities typical of atelectasis or scarring. No focal airspace disease suspicious for COVID 19 pneumonia. No pulmonary edema, large pleural effusion or pneumothorax. The bones are under mineralized. Surgical anchor in left proximal humerus. IMPRESSION: 1. Mild cardiomegaly.  Aortic Atherosclerosis (ICD10-I70.0). 2. Linear retrocardiac opacities typical of atelectasis or scarring. No findings to suggest COVID-19 pneumonia. Electronically Signed   By: Keith Rake M.D.   On: 01/08/2021 17:40     Subjective: No acute issues or events overnight denies nausea vomiting diarrhea constipation headache fevers or chills.   Discharge Exam: Vitals:   01/09/21 1637 01/09/21 2011  BP: 115/66 (!) 150/79  Pulse: (!) 59 61  Resp: 16 17  Temp: 98.1 F (36.7 C) 98.2 F (36.8 C)  SpO2: 100% 100%   Vitals:   01/09/21 1354 01/09/21 1558 01/09/21 1637 01/09/21 2011  BP: 129/67 112/69 115/66 (!) 150/79  Pulse: (!) 58 67 (!) 59 61  Resp: 15 16 16 17   Temp: 98.6 F (37 C) 97.9 F (36.6 C) 98.1 F (36.7 C) 98.2 F (36.8 C)  TempSrc: Oral Oral Oral Oral  SpO2: 100% 100% 100% 100%  Weight:      Height:        General: Pt is alert, awake, not in acute distress Cardiovascular: RRR, S1/S2 +, no rubs, no gallops Respiratory: CTA bilaterally, no wheezing, no rhonchi Abdominal: Soft, NT, ND, bowel sounds + Extremities: no edema, no cyanosis    The results of significant diagnostics from this hospitalization (including imaging, microbiology, ancillary and laboratory) are listed below  for reference.     Microbiology: Recent Results (from the past 240 hour(s))  Resp Panel by RT-PCR (Flu A&B, Covid) Nasopharyngeal Swab     Status: Abnormal   Collection Time: 01/08/21  2:00 PM   Specimen: Nasopharyngeal Swab; Nasopharyngeal(NP) swabs in vial transport medium  Result Value Ref Range Status   SARS Coronavirus 2 by RT PCR POSITIVE (A) NEGATIVE Final    Comment: RESULT CALLED TO, READ BACK BY AND VERIFIED WITH: FREEMAN,K. RN @1621  ON 04.08.2022 BY COHEN,K (NOTE) SARS-CoV-2 target nucleic acids are DETECTED.  The SARS-CoV-2 RNA is generally detectable in upper respiratory specimens during the acute phase of infection. Positive results are indicative of the presence of the identified virus, but do not rule out bacterial infection or co-infection with other pathogens not detected by the test. Clinical correlation with patient history and other diagnostic information is necessary to determine patient infection status. The expected result is Negative.  Fact Sheet for Patients: EntrepreneurPulse.com.au  Fact Sheet for Healthcare Providers: IncredibleEmployment.be  This test is not yet approved or cleared by the [place] FDA and  has been authorized for detection and/or diagnosis of SARS-CoV-2 by FDA under an Emergency Use Authorization (EUA).  This EUA will remain in effect (meaning this  test can be used) for the duration of  the COVID-19 declaration under Section 564(b)(1) of the Act, 21 U.S.C. section 360bbb-3(b)(1), unless the authorization is terminated or revoked sooner.     Influenza A by PCR NEGATIVE NEGATIVE Final   Influenza B by PCR NEGATIVE NEGATIVE Final    Comment: (NOTE) The Xpert Xpress SARS-CoV-2/FLU/RSV plus assay is intended as an aid in the diagnosis of influenza from Nasopharyngeal swab specimens and should not be used as a sole basis for treatment. Nasal washings and aspirates are unacceptable for Xpert  Xpress SARS-CoV-2/FLU/RSV testing.  Fact Sheet for Patients: EntrepreneurPulse.com.au  Fact Sheet for Healthcare Providers: IncredibleEmployment.be  This test is not yet approved or cleared by the [place] FDA and has been authorized for detection and/or diagnosis of SARS-CoV-2 by FDA under an Emergency Use Authorization (EUA). This EUA will remain in effect (meaning this test can be used) for the duration of the COVID-19 declaration under Section 564(b)(1) of the Act, 21 U.S.C. section 360bbb-3(b)(1), unless the authorization is terminated or revoked.  Performed at [facility], [place] [address], [place], [place] [address]      Labs: BNP (last 3 results) No results for input(s): BNP in the last 8760 hours. Basic Metabolic Panel: Recent Labs  Lab 01/08/21 0955 01/09/21 0315  NA 142 141  K 4.0  4.0  CL 110 111  CO2 21* 23  GLUCOSE 90 82  BUN 30* 32*  CREATININE 1.73* 1.51*  CALCIUM 9.3 9.3   Liver Function Tests: Recent Labs  Lab 01/08/21 0955  AST 20  ALT 10  ALKPHOS 135*  BILITOT 0.3  PROT 7.4  ALBUMIN 3.9   No results for input(s): LIPASE, AMYLASE in the last 168 hours. No results for input(s): AMMONIA in the last 168 hours. CBC: Recent Labs  Lab 01/08/21 0955 01/09/21 0315 01/10/21 0254  WBC 3.2* 4.0 4.2  NEUTROABS 1.5*  --   --   HGB 7.7* 7.1* 8.8*  HCT 23.6* 22.5* 26.5*  MCV 77.4* 78.9* 77.0*  PLT 97* 99* 96*   Cardiac Enzymes: No results for input(s): CKTOTAL, CKMB, CKMBINDEX, TROPONINI in the last 168 hours. BNP: Invalid input(s): POCBNP CBG: Recent Labs  Lab 01/10/21 0815  GLUCAP 76   D-Dimer No results for input(s): DDIMER in the last 72 hours. Hgb A1c No results for input(s): HGBA1C in the last 72 hours. Lipid Profile No results for input(s): CHOL, HDL, LDLCALC, TRIG, CHOLHDL, LDLDIRECT in the last 72 hours. Thyroid function studies No results for input(s): TSH, T4TOTAL,  T3FREE, THYROIDAB in the last 72 hours.  Invalid input(s): FREET3 Anemia work up Recent Labs    01/08/21 0954  FERRITIN 25  TIBC 319  IRON 53   Urinalysis    Component Value Date/Time   COLORURINE DARK YELLOW 11/07/2017 1455   APPEARANCEUR CLOUDY (A) 11/07/2017 1455   LABSPEC 1.025 11/07/2017 1455   PHURINE < OR = 5.0 11/07/2017 1455   GLUCOSEU NEGATIVE 11/07/2017 1455   HGBUR NEGATIVE 11/07/2017 1455   BILIRUBINUR NEGATIVE 04/11/2016 1114   KETONESUR TRACE (A) 11/07/2017 1455   PROTEINUR 2+ (A) 11/07/2017 1455   UROBILINOGEN 1.0 07/07/2015 1917   NITRITE NEGATIVE 11/07/2017 1455   LEUKOCYTESUR NEGATIVE 11/07/2017 1455   Sepsis Labs Invalid input(s): PROCALCITONIN,  WBC,  LACTICIDVEN Microbiology Recent Results (from the past 240 hour(s))  Resp Panel by RT-PCR (Flu A&B, Covid) Nasopharyngeal Swab     Status: Abnormal   Collection Time: 01/08/21  2:00 PM   Specimen: Nasopharyngeal Swab; Nasopharyngeal(NP) swabs in vial transport medium  Result Value Ref Range Status   SARS Coronavirus 2 by RT PCR POSITIVE (A) NEGATIVE Final    Comment: RESULT CALLED TO, READ BACK BY AND VERIFIED WITH: FREEMAN,K. RN @1621  ON 04.08.2022 BY COHEN,K (NOTE) SARS-CoV-2 target nucleic acids are DETECTED.  The SARS-CoV-2 RNA is generally detectable in upper respiratory specimens during the acute phase of infection. Positive results are indicative of the presence of the identified virus, but do not rule out bacterial infection or co-infection with other pathogens not detected by the test. Clinical correlation with patient history and other diagnostic information is necessary to determine patient infection status. The expected result is Negative.  Fact Sheet for Patients: EntrepreneurPulse.com.au  Fact Sheet for Healthcare Providers: IncredibleEmployment.be  This test is not yet approved or cleared by the [place] FDA and  has been authorized for  detection and/or diagnosis of SARS-CoV-2 by FDA under an Emergency Use Authorization (EUA).  This EUA will remain in effect (meaning this  test can be used) for the duration of  the COVID-19 declaration under Section 564(b)(1) of the Act, 21 U.S.C. section 360bbb-3(b)(1), unless the authorization is terminated or revoked sooner.     Influenza A by PCR NEGATIVE NEGATIVE Final   Influenza B by PCR NEGATIVE NEGATIVE Final    Comment: (NOTE)  The Xpert Xpress SARS-CoV-2/FLU/RSV plus assay is intended as an aid in the diagnosis of influenza from Nasopharyngeal swab specimens and should not be used as a sole basis for treatment. Nasal washings and aspirates are unacceptable for Xpert Xpress SARS-CoV-2/FLU/RSV testing.  Fact Sheet for Patients: EntrepreneurPulse.com.au  Fact Sheet for Healthcare Providers: IncredibleEmployment.be  This test is not yet approved or cleared by the [place] FDA and has been authorized for detection and/or diagnosis of SARS-CoV-2 by FDA under an Emergency Use Authorization (EUA). This EUA will remain in effect (meaning this test can be used) for the duration of the COVID-19 declaration under Section 564(b)(1) of the Act, 21 U.S.C. section 360bbb-3(b)(1), unless the authorization is terminated or revoked.  Performed at [facility], [place] [address], [place], [place] [address]      Time coordinating discharge: Over 30 minutes  SIGNED:   Little Ishikawa, DO [facility] 01/10/2021, 12:05 PM Pager   If 7PM-7AM, please contact night-coverage [email or web address]

## 2021-01-10 NOTE — Progress Notes (Signed)
Signout rec'd and EGD report by Dr. Ardis Hughs reviewed.  Hgb stable, no reported overt GI bleeding  Patient reportedly for discharge today.  I elected not to see her since stable and COVID positive.  No routine GI office follow up needed.  - H. Loletha Carrow, MD

## 2021-01-10 NOTE — Plan of Care (Signed)
Plan of care reviewed with pt. Pt alert and oriented and no acute distress. Pt to be discharged to home via personal vehicle. Pt caregiver to pick her up. Pt discharge instructions reviewed with pt and pt verbalizes understanding and cooperation.  Problem: Education: Goal: Knowledge of General Education information will improve Description: Including pain rating scale, medication(s)/side effects and non-pharmacologic comfort measures 01/10/2021 0936 by Barrington Ellison, RN Outcome: Adequate for Discharge 01/10/2021 0936 by Barrington Ellison, RN Outcome: Adequate for Discharge   Problem: Clinical Measurements: Goal: Will remain free from infection 01/10/2021 0936 by Barrington Ellison, RN Outcome: Adequate for Discharge 01/10/2021 0936 by Barrington Ellison, RN Outcome: Adequate for Discharge Goal: Diagnostic test results will improve 01/10/2021 0936 by Barrington Ellison, RN Outcome: Adequate for Discharge 01/10/2021 0936 by Barrington Ellison, RN Outcome: Adequate for Discharge   Problem: Elimination: Goal: Will not experience complications related to bowel motility 01/10/2021 0936 by Barrington Ellison, RN Outcome: Adequate for Discharge 01/10/2021 0936 by Barrington Ellison, RN Outcome: Adequate for Discharge Goal: Will not experience complications related to urinary retention 01/10/2021 0936 by Barrington Ellison, RN Outcome: Adequate for Discharge 01/10/2021 0936 by Barrington Ellison, RN Outcome: Adequate for Discharge   Problem: Pain Managment: Goal: General experience of comfort will improve 01/10/2021 0936 by Barrington Ellison, RN Outcome: Adequate for Discharge 01/10/2021 0936 by Barrington Ellison, RN Outcome: Adequate for Discharge   Problem: Safety: Goal: Ability to remain free from injury will improve 01/10/2021 0936 by Barrington Ellison, RN Outcome: Adequate for Discharge 01/10/2021 0936 by Barrington Ellison, RN Outcome: Adequate for Discharge   Problem: Skin Integrity: Goal: Risk for  impaired skin integrity will decrease 01/10/2021 0936 by Barrington Ellison, RN Outcome: Adequate for Discharge 01/10/2021 0936 by Barrington Ellison, RN Outcome: Adequate for Discharge

## 2021-01-11 ENCOUNTER — Encounter (HOSPITAL_COMMUNITY): Payer: Self-pay | Admitting: Gastroenterology

## 2021-01-12 LAB — SURGICAL PATHOLOGY

## 2021-02-05 ENCOUNTER — Other Ambulatory Visit: Payer: Self-pay

## 2021-02-05 ENCOUNTER — Inpatient Hospital Stay: Payer: Medicare Other

## 2021-02-05 ENCOUNTER — Inpatient Hospital Stay: Payer: Medicare Other | Attending: Oncology | Admitting: Oncology

## 2021-02-05 VITALS — BP 100/63 | HR 64 | Temp 98.1°F | Resp 18

## 2021-02-05 VITALS — BP 113/71 | HR 60 | Temp 97.7°F | Resp 17 | Ht 62.0 in | Wt 140.8 lb

## 2021-02-05 DIAGNOSIS — Z79899 Other long term (current) drug therapy: Secondary | ICD-10-CM | POA: Insufficient documentation

## 2021-02-05 DIAGNOSIS — N189 Chronic kidney disease, unspecified: Secondary | ICD-10-CM

## 2021-02-05 DIAGNOSIS — N1831 Chronic kidney disease, stage 3a: Secondary | ICD-10-CM | POA: Diagnosis present

## 2021-02-05 DIAGNOSIS — D469 Myelodysplastic syndrome, unspecified: Secondary | ICD-10-CM | POA: Insufficient documentation

## 2021-02-05 DIAGNOSIS — R634 Abnormal weight loss: Secondary | ICD-10-CM | POA: Insufficient documentation

## 2021-02-05 DIAGNOSIS — D631 Anemia in chronic kidney disease: Secondary | ICD-10-CM

## 2021-02-05 DIAGNOSIS — D696 Thrombocytopenia, unspecified: Secondary | ICD-10-CM | POA: Diagnosis not present

## 2021-02-05 LAB — CBC WITH DIFFERENTIAL (CANCER CENTER ONLY)
Abs Immature Granulocytes: 0 10*3/uL (ref 0.00–0.07)
Basophils Absolute: 0 10*3/uL (ref 0.0–0.1)
Basophils Relative: 0 %
Eosinophils Absolute: 0.1 10*3/uL (ref 0.0–0.5)
Eosinophils Relative: 2 %
HCT: 24.6 % — ABNORMAL LOW (ref 36.0–46.0)
Hemoglobin: 7.9 g/dL — ABNORMAL LOW (ref 12.0–15.0)
Immature Granulocytes: 0 %
Lymphocytes Relative: 48 %
Lymphs Abs: 1.6 10*3/uL (ref 0.7–4.0)
MCH: 24.3 pg — ABNORMAL LOW (ref 26.0–34.0)
MCHC: 32.1 g/dL (ref 30.0–36.0)
MCV: 75.7 fL — ABNORMAL LOW (ref 80.0–100.0)
Monocytes Absolute: 0.2 10*3/uL (ref 0.1–1.0)
Monocytes Relative: 7 %
Neutro Abs: 1.4 10*3/uL — ABNORMAL LOW (ref 1.7–7.7)
Neutrophils Relative %: 43 %
Platelet Count: 95 10*3/uL — ABNORMAL LOW (ref 150–400)
RBC: 3.25 MIL/uL — ABNORMAL LOW (ref 3.87–5.11)
RDW: 19.7 % — ABNORMAL HIGH (ref 11.5–15.5)
WBC Count: 3.3 10*3/uL — ABNORMAL LOW (ref 4.0–10.5)
nRBC: 0 % (ref 0.0–0.2)

## 2021-02-05 LAB — CMP (CANCER CENTER ONLY)
ALT: 9 U/L (ref 0–44)
AST: 22 U/L (ref 15–41)
Albumin: 4.1 g/dL (ref 3.5–5.0)
Alkaline Phosphatase: 134 U/L — ABNORMAL HIGH (ref 38–126)
Anion gap: 9 (ref 5–15)
BUN: 42 mg/dL — ABNORMAL HIGH (ref 8–23)
CO2: 25 mmol/L (ref 22–32)
Calcium: 9.7 mg/dL (ref 8.9–10.3)
Chloride: 109 mmol/L (ref 98–111)
Creatinine: 1.76 mg/dL — ABNORMAL HIGH (ref 0.44–1.00)
GFR, Estimated: 29 mL/min — ABNORMAL LOW (ref >60)
Glucose, Bld: 79 mg/dL (ref 70–99)
Potassium: 4.3 mmol/L (ref 3.5–5.1)
Sodium: 143 mmol/L (ref 135–145)
Total Bilirubin: 0.4 mg/dL (ref 0.3–1.2)
Total Protein: 7.8 g/dL (ref 6.5–8.1)

## 2021-02-05 LAB — SAMPLE TO BLOOD BANK

## 2021-02-05 MED ORDER — DARBEPOETIN ALFA 500 MCG/ML IJ SOSY
500.0000 ug | PREFILLED_SYRINGE | Freq: Once | INTRAMUSCULAR | Status: AC
  Administered 2021-02-05: 500 ug via SUBCUTANEOUS

## 2021-02-05 MED ORDER — DARBEPOETIN ALFA 500 MCG/ML IJ SOSY
PREFILLED_SYRINGE | INTRAMUSCULAR | Status: AC
  Filled 2021-02-05: qty 1

## 2021-02-05 NOTE — Patient Instructions (Signed)
Darbepoetin Alfa injection What is this medicine? DARBEPOETIN ALFA (dar be POE e tin AL fa) helps your body make more red blood cells. It is used to treat anemia caused by chronic kidney failure and chemotherapy. This medicine may be used for other purposes; ask your health care provider or pharmacist if you have questions. COMMON BRAND NAME(S): Aranesp What should I tell my health care provider before I take this medicine? They need to know if you have any of these conditions:  blood clotting disorders or history of blood clots  cancer patient not on chemotherapy  cystic fibrosis  heart disease, such as angina, heart failure, or a history of a heart attack  hemoglobin level of 12 g/dL or greater  high blood pressure  low levels of folate, iron, or vitamin B12  seizures  an unusual or allergic reaction to darbepoetin, erythropoietin, albumin, hamster proteins, latex, other medicines, foods, dyes, or preservatives  pregnant or trying to get pregnant  breast-feeding How should I use this medicine? This medicine is for injection into a vein or under the skin. It is usually given by a health care professional in a hospital or clinic setting. If you get this medicine at home, you will be taught how to prepare and give this medicine. Use exactly as directed. Take your medicine at regular intervals. Do not take your medicine more often than directed. It is important that you put your used needles and syringes in a special sharps container. Do not put them in a trash can. If you do not have a sharps container, call your pharmacist or healthcare provider to get one. A special MedGuide will be given to you by the pharmacist with each prescription and refill. Be sure to read this information carefully each time. Talk to your pediatrician regarding the use of this medicine in children. While this medicine may be used in children as young as 1 month of age for selected conditions, precautions do  apply. Overdosage: If you think you have taken too much of this medicine contact a poison control center or emergency room at once. NOTE: This medicine is only for you. Do not share this medicine with others. What if I miss a dose? If you miss a dose, take it as soon as you can. If it is almost time for your next dose, take only that dose. Do not take double or extra doses. What may interact with this medicine? Do not take this medicine with any of the following medications:  epoetin alfa This list may not describe all possible interactions. Give your health care provider a list of all the medicines, herbs, non-prescription drugs, or dietary supplements you use. Also tell them if you smoke, drink alcohol, or use illegal drugs. Some items may interact with your medicine. What should I watch for while using this medicine? Your condition will be monitored carefully while you are receiving this medicine. You may need blood work done while you are taking this medicine. This medicine may cause a decrease in vitamin B6. You should make sure that you get enough vitamin B6 while you are taking this medicine. Discuss the foods you eat and the vitamins you take with your health care professional. What side effects may I notice from receiving this medicine? Side effects that you should report to your doctor or health care professional as soon as possible:  allergic reactions like skin rash, itching or hives, swelling of the face, lips, or tongue  breathing problems  changes in   vision  chest pain  confusion, trouble speaking or understanding  feeling faint or lightheaded, falls  high blood pressure  muscle aches or pains  pain, swelling, warmth in the leg  rapid weight gain  severe headaches  sudden numbness or weakness of the face, arm or leg  trouble walking, dizziness, loss of balance or coordination  seizures (convulsions)  swelling of the ankles, feet, hands  unusually weak or  tired Side effects that usually do not require medical attention (report to your doctor or health care professional if they continue or are bothersome):  diarrhea  fever, chills (flu-like symptoms)  headaches  nausea, vomiting  redness, stinging, or swelling at site where injected This list may not describe all possible side effects. Call your doctor for medical advice about side effects. You may report side effects to FDA at 1-800-FDA-1088. Where should I keep my medicine? Keep out of the reach of children. Store in a refrigerator between 2 and 8 degrees C (36 and 46 degrees F). Do not freeze. Do not shake. Throw away any unused portion if using a single-dose vial. Throw away any unused medicine after the expiration date. NOTE: This sheet is a summary. It may not cover all possible information. If you have questions about this medicine, talk to your doctor, pharmacist, or health care provider.  2021 Elsevier/Gold Standard (2017-10-04 16:44:20)  

## 2021-02-05 NOTE — Progress Notes (Signed)
Hematology and Oncology Follow Up Visit  Susan [name] [id number] November 23, 1940 80 y.o. 02/05/2021 9:20 AM Susan [name]., FNPSmith, Susan [name]., [name]   Principle Diagnosis:  80 year old woman with anemia related to chronic kidney disease as well as myelodysplasia diagnosed in 2010.    Current therapy:  Aranesp 500 mcg every 4 weeks to keep her hemoglobin above 11.    Interim History:  Susan [name] presents today for repeat evaluation.  Since the last visit, she reports no major changes in her health.  She continues to tolerate Aranesp without any complaints.  She denies any nausea, vomiting or abdominal pain.  She denies excessive fatigue, tiredness or weakness.  She denies any dyspnea on exertion.  She denies any hematochezia or melena.       Medications: Unchanged on review. Current Outpatient Medications  Medication Sig Dispense Refill  . acetaminophen (TYLENOL) 500 MG tablet Take 500-1,000 mg by mouth every 6 (six) hours as needed (for pain.).    [name] allopurinol (ZYLOPRIM) 300 MG tablet Take 300 mg by mouth daily.    [name] amLODipine (NORVASC) 10 MG tablet TAKE 1/2 TABLET BY MOUTH DAILY AT BEDTIME; (TAKE 1 TABLET IF SBP IS >150) (Patient taking differently: Take 10 mg by mouth at bedtime. (TAKE 1 TABLET IF SBP IS >150)) 90 tablet 0  . Cholecalciferol (VITAMIN D-3) 5000 UNITS TABS Take 15,000 Units by mouth daily.     . Darbepoetin Alfa 300 MCG/ML SOLN Inject 300 mcg into the skin See admin instructions. USE INJECTION EVERY 3 WEEKS AS NEEDED FOR HEMOGLOBIN LESS THAN 11.    . diphenhydrAMINE (BENADRYL) 25 mg capsule One capsule every 6 hours x 3 days, then as needed for symptoms of rash/itching 30 capsule 0  . donepezil (ARICEPT) 10 MG tablet Take 10 mg by mouth daily.    . fluticasone (FLONASE) 50 MCG/ACT nasal spray Place 1 spray into both nostrils in the morning and at bedtime.    . hydroxypropyl methylcellulose / hypromellose (ISOPTO TEARS / GONIOVISC) 2.5 % ophthalmic solution Place 1-2 drops  into both eyes 3 (three) times daily as needed for dry eyes.    [name] labetalol (NORMODYNE) 200 MG tablet Take 200 mg by mouth 2 (two) times daily.    [name] levothyroxine (SYNTHROID) 75 MCG tablet Take 75 mcg by mouth every morning.    . memantine (NAMENDA) 10 MG tablet Take 1 tablet (10 mg total) by mouth 2 (two) times daily. 60 tablet 11  . mirtazapine (REMERON) 15 MG tablet Take 15 mg by mouth at bedtime.    . montelukast (SINGULAIR) 10 MG tablet SMARTSIG:1 Tablet(s) By Mouth Every Evening    . pantoprazole (PROTONIX) 40 MG tablet Take 1 tablet (40 mg total) by mouth 2 (two) times daily before a meal. 60 tablet 0  . vitamin E 200 UNIT capsule Take 200 Units by mouth daily.     No current facility-administered medications for this visit.       Physical Exam:  Blood pressure 113/71, pulse 60, temperature 97.7 F (36.5 C), temperature source Tympanic, resp. rate 17, height 5\' 2"  (1.575 m), weight 140 lb 12.8 oz (63.9 kg), SpO2 100 %.     ECOG: 1   General appearance: Comfortable appearing without any discomfort Head: Normocephalic without any trauma Oropharynx: Mucous membranes are moist and pink without any thrush or ulcers. Eyes: Pupils are equal and round reactive to light. Lymph nodes: No cervical, supraclavicular, inguinal or axillary lymphadenopathy.   Heart:regular rate and  rhythm.  S1 and S2 without leg edema. Lung: Clear without any rhonchi or wheezes.  No dullness to percussion. Abdomin: Soft, nontender, nondistended with good bowel sounds.  No hepatosplenomegaly. Musculoskeletal: No joint deformity or effusion.  Full range of motion noted. Neurological: No deficits noted on motor, sensory and deep tendon reflex exam. Skin: No petechial rash or dryness.  Appeared moist.        Lab Results: Lab Results  Component Value Date   WBC 4.2 01/10/2021   HGB 8.8 (L) 01/10/2021   HCT 26.5 (L) 01/10/2021   MCV 77.0 (L) 01/10/2021   PLT 96 (L) 01/10/2021     Chemistry       Component Value Date/Time   NA 141 01/09/2021 0315   NA 143 06/30/2017 0846   K 4.0 01/09/2021 0315   K 4.4 06/30/2017 0846   CL 111 01/09/2021 0315   CO2 23 01/09/2021 0315   CO2 24 06/30/2017 0846   BUN 32 (H) 01/09/2021 0315   BUN 26.5 (H) 06/30/2017 0846   CREATININE 1.51 (H) 01/09/2021 0315   CREATININE 1.73 (H) 01/08/2021 0955   CREATININE 1.31 (H) 07/02/2018 0950   CREATININE 1.4 (H) 06/30/2017 0846      Component Value Date/Time   CALCIUM 9.3 01/09/2021 0315   CALCIUM 10.1 06/30/2017 0846   ALKPHOS 135 (H) 01/08/2021 0955   ALKPHOS 101 06/30/2017 0846   AST 20 01/08/2021 0955   AST 17 06/30/2017 0846   ALT 10 01/08/2021 0955   ALT <6 06/30/2017 0846   BILITOT 0.3 01/08/2021 0955   BILITOT 0.76 06/30/2017 0846      Impression and Plan:   80 year old woman with:   1.  Anemia related to chronic kidney disease and element of myelodysplasia diagnosed in 2010.  She is currently on supportive care with Aranesp and transfusion.  Her disease status was updated at this time and treatment choices were reviewed.  Risks and benefits of continuing Aranesp at the current dose were discussed.  Potential complications including thromboembolism, hypertension, and others were reiterated.  She is agreeable to continue at this time.  I will update her iron studies with the next visit and replace iron with infusion if needed.  2.  Thrombocytopenia: No active bleeding noted at this time with platelet count remains mildly low.  Platelet count continues to be close to 100 without any changes.  3.  Weight loss: She is eating better and gained close to 10 pounds since the last visit.  4.  Follow-up: She will continue monthly Aranesp injection and MD follow-up in 4 months.  30  minutes were dedicated to this visit.  The time was spent on reviewing laboratory data, disease status update and future plan of care discussion.         Zola Button MD 5/6/20229:20 AM

## 2021-03-03 ENCOUNTER — Ambulatory Visit (INDEPENDENT_AMBULATORY_CARE_PROVIDER_SITE_OTHER): Payer: Medicare Other | Admitting: Podiatry

## 2021-03-03 ENCOUNTER — Other Ambulatory Visit: Payer: Self-pay

## 2021-03-03 ENCOUNTER — Encounter: Payer: Self-pay | Admitting: Podiatry

## 2021-03-03 DIAGNOSIS — M79674 Pain in right toe(s): Secondary | ICD-10-CM

## 2021-03-03 DIAGNOSIS — Q828 Other specified congenital malformations of skin: Secondary | ICD-10-CM

## 2021-03-03 DIAGNOSIS — M79675 Pain in left toe(s): Secondary | ICD-10-CM | POA: Diagnosis not present

## 2021-03-03 DIAGNOSIS — N1831 Chronic kidney disease, stage 3a: Secondary | ICD-10-CM | POA: Diagnosis not present

## 2021-03-03 DIAGNOSIS — B351 Tinea unguium: Secondary | ICD-10-CM

## 2021-03-03 NOTE — Progress Notes (Signed)
This patient returns to my office for at risk foot care.  This patient requires this care by a professional since this patient will be at risk due to having chronic kidney disease.   This patient is unable to cut nails herself since the patient cannot reach her nails.These nails are painful walking and wearing shoes.  This patient presents for at risk foot care today.  General Appearance  Alert, conversant and in no acute stress.  Vascular  Dorsalis pedis and posterior tibial  pulses are palpable  bilaterally.  Capillary return is within normal limits  bilaterally. Temperature is within normal limits  bilaterally.  Neurologic  Senn-Weinstein monofilament wire test within normal limits  bilaterally. Muscle power within normal limits bilaterally.  Nails Thick disfigured discolored nails with subungual debris  from hallux to fifth toes bilaterally. No evidence of bacterial infection or drainage bilaterally.  Orthopedic  No limitations of motion  feet .  No crepitus or effusions noted.  HAV with contracted digits  B/L.  Skin  normotropic skin noted bilaterally.  No signs of infections or ulcers noted.   Porokeratosis sub 3 left forefoot.  Onychomycosis  Pain in right toes  Pain in left toes  Consent was obtained for treatment procedures.   Mechanical debridement of nails 1-5  bilaterally performed with a nail nipper.  Filed with dremel without incident. Debridement of porokeratosis with #15 blade.  Return office visit   prn                  Told patient to return for periodic foot care and evaluation due to potential at risk complications.   Gardiner Barefoot DPM

## 2021-03-09 NOTE — Progress Notes (Signed)
Pt was seen at [facility] today/ Dustin Folks NP. Office called and reported pt's Hgb as 6.7. Office encouraged pt to go to ER but pt refused. Pt stated she has an appointment on Friday 03/12/21 at [facility]. Will request lab work to be faxed here and update Dr Alen Blew for next steps.

## 2021-03-10 ENCOUNTER — Telehealth: Payer: Self-pay

## 2021-03-10 DIAGNOSIS — N189 Chronic kidney disease, unspecified: Secondary | ICD-10-CM

## 2021-03-10 NOTE — Telephone Encounter (Signed)
Contacted pt to let her know she has an appointment on Saturday 03/13/21 for 2 units of blood . Pt verbalized understanding.

## 2021-03-12 ENCOUNTER — Inpatient Hospital Stay: Payer: Medicare Other

## 2021-03-12 ENCOUNTER — Inpatient Hospital Stay: Payer: Medicare Other | Attending: Oncology

## 2021-03-12 ENCOUNTER — Other Ambulatory Visit: Payer: Self-pay

## 2021-03-12 ENCOUNTER — Inpatient Hospital Stay (HOSPITAL_BASED_OUTPATIENT_CLINIC_OR_DEPARTMENT_OTHER): Payer: Medicare Other | Admitting: Oncology

## 2021-03-12 ENCOUNTER — Inpatient Hospital Stay (HOSPITAL_COMMUNITY)
Admission: EM | Admit: 2021-03-12 | Discharge: 2021-03-16 | DRG: 357 | Disposition: A | Discharge status: Home / Self Care | Payer: Medicare Other | Attending: Internal Medicine | Admitting: Internal Medicine

## 2021-03-12 ENCOUNTER — Encounter (HOSPITAL_COMMUNITY): Payer: Self-pay

## 2021-03-12 ENCOUNTER — Telehealth: Payer: Self-pay

## 2021-03-12 VITALS — BP 108/54 | HR 59 | Resp 18

## 2021-03-12 VITALS — BP 100/68 | HR 57 | Temp 98.3°F | Resp 17 | Wt 142.5 lb

## 2021-03-12 DIAGNOSIS — R531 Weakness: Secondary | ICD-10-CM | POA: Insufficient documentation

## 2021-03-12 DIAGNOSIS — K5731 Diverticulosis of large intestine without perforation or abscess with bleeding: Principal | ICD-10-CM | POA: Diagnosis present

## 2021-03-12 DIAGNOSIS — D469 Myelodysplastic syndrome, unspecified: Secondary | ICD-10-CM

## 2021-03-12 DIAGNOSIS — I1310 Hypertensive heart and chronic kidney disease without heart failure, with stage 1 through stage 4 chronic kidney disease, or unspecified chronic kidney disease: Secondary | ICD-10-CM | POA: Diagnosis present

## 2021-03-12 DIAGNOSIS — N189 Chronic kidney disease, unspecified: Secondary | ICD-10-CM

## 2021-03-12 DIAGNOSIS — F0390 Unspecified dementia without behavioral disturbance: Secondary | ICD-10-CM | POA: Diagnosis present

## 2021-03-12 DIAGNOSIS — J302 Other seasonal allergic rhinitis: Secondary | ICD-10-CM

## 2021-03-12 DIAGNOSIS — R69 Illness, unspecified: Secondary | ICD-10-CM

## 2021-03-12 DIAGNOSIS — N183 Chronic kidney disease, stage 3 unspecified: Secondary | ICD-10-CM | POA: Diagnosis present

## 2021-03-12 DIAGNOSIS — N1832 Chronic kidney disease, stage 3b: Secondary | ICD-10-CM | POA: Diagnosis present

## 2021-03-12 DIAGNOSIS — K219 Gastro-esophageal reflux disease without esophagitis: Secondary | ICD-10-CM | POA: Diagnosis present

## 2021-03-12 DIAGNOSIS — K921 Melena: Secondary | ICD-10-CM | POA: Insufficient documentation

## 2021-03-12 DIAGNOSIS — D509 Iron deficiency anemia, unspecified: Secondary | ICD-10-CM

## 2021-03-12 DIAGNOSIS — D631 Anemia in chronic kidney disease: Secondary | ICD-10-CM

## 2021-03-12 DIAGNOSIS — I10 Essential (primary) hypertension: Secondary | ICD-10-CM | POA: Diagnosis present

## 2021-03-12 DIAGNOSIS — J309 Allergic rhinitis, unspecified: Secondary | ICD-10-CM | POA: Diagnosis present

## 2021-03-12 DIAGNOSIS — E785 Hyperlipidemia, unspecified: Secondary | ICD-10-CM | POA: Diagnosis present

## 2021-03-12 DIAGNOSIS — D649 Anemia, unspecified: Secondary | ICD-10-CM

## 2021-03-12 DIAGNOSIS — D62 Acute posthemorrhagic anemia: Secondary | ICD-10-CM | POA: Diagnosis present

## 2021-03-12 DIAGNOSIS — D696 Thrombocytopenia, unspecified: Secondary | ICD-10-CM | POA: Insufficient documentation

## 2021-03-12 DIAGNOSIS — Z87891 Personal history of nicotine dependence: Secondary | ICD-10-CM

## 2021-03-12 DIAGNOSIS — Z791 Long term (current) use of non-steroidal anti-inflammatories (NSAID): Secondary | ICD-10-CM

## 2021-03-12 DIAGNOSIS — K2950 Unspecified chronic gastritis without bleeding: Secondary | ICD-10-CM | POA: Diagnosis present

## 2021-03-12 DIAGNOSIS — K922 Gastrointestinal hemorrhage, unspecified: Secondary | ICD-10-CM | POA: Diagnosis not present

## 2021-03-12 DIAGNOSIS — K2100 Gastro-esophageal reflux disease with esophagitis, without bleeding: Secondary | ICD-10-CM | POA: Diagnosis present

## 2021-03-12 DIAGNOSIS — Z823 Family history of stroke: Secondary | ICD-10-CM

## 2021-03-12 DIAGNOSIS — Z8249 Family history of ischemic heart disease and other diseases of the circulatory system: Secondary | ICD-10-CM

## 2021-03-12 DIAGNOSIS — E039 Hypothyroidism, unspecified: Secondary | ICD-10-CM | POA: Diagnosis present

## 2021-03-12 DIAGNOSIS — Z8616 Personal history of COVID-19: Secondary | ICD-10-CM

## 2021-03-12 DIAGNOSIS — R5383 Other fatigue: Secondary | ICD-10-CM | POA: Insufficient documentation

## 2021-03-12 DIAGNOSIS — K648 Other hemorrhoids: Secondary | ICD-10-CM | POA: Diagnosis present

## 2021-03-12 DIAGNOSIS — K625 Hemorrhage of anus and rectum: Secondary | ICD-10-CM | POA: Diagnosis not present

## 2021-03-12 DIAGNOSIS — N1831 Chronic kidney disease, stage 3a: Secondary | ICD-10-CM

## 2021-03-12 DIAGNOSIS — Z79899 Other long term (current) drug therapy: Secondary | ICD-10-CM | POA: Insufficient documentation

## 2021-03-12 DIAGNOSIS — Z8052 Family history of malignant neoplasm of bladder: Secondary | ICD-10-CM

## 2021-03-12 DIAGNOSIS — Z20822 Contact with and (suspected) exposure to covid-19: Secondary | ICD-10-CM | POA: Diagnosis present

## 2021-03-12 LAB — POC OCCULT BLOOD, ED: Fecal Occult Bld: POSITIVE — AB

## 2021-03-12 LAB — CBC WITH DIFFERENTIAL (CANCER CENTER ONLY)
Abs Immature Granulocytes: 0.03 10*3/uL (ref 0.00–0.07)
Basophils Absolute: 0 10*3/uL (ref 0.0–0.1)
Basophils Relative: 0 %
Eosinophils Absolute: 0.1 10*3/uL (ref 0.0–0.5)
Eosinophils Relative: 1 %
HCT: 19.5 % — ABNORMAL LOW (ref 36.0–46.0)
Hemoglobin: 6.2 g/dL — CL (ref 12.0–15.0)
Immature Granulocytes: 0 %
Lymphocytes Relative: 17 %
Lymphs Abs: 1.3 10*3/uL (ref 0.7–4.0)
MCH: 24.1 pg — ABNORMAL LOW (ref 26.0–34.0)
MCHC: 31.8 g/dL (ref 30.0–36.0)
MCV: 75.9 fL — ABNORMAL LOW (ref 80.0–100.0)
Monocytes Absolute: 0.4 10*3/uL (ref 0.1–1.0)
Monocytes Relative: 5 %
Neutro Abs: 5.8 10*3/uL (ref 1.7–7.7)
Neutrophils Relative %: 77 %
Platelet Count: 127 10*3/uL — ABNORMAL LOW (ref 150–400)
RBC: 2.57 MIL/uL — ABNORMAL LOW (ref 3.87–5.11)
RDW: 21.9 % — ABNORMAL HIGH (ref 11.5–15.5)
WBC Count: 7.6 10*3/uL (ref 4.0–10.5)
nRBC: 0 % (ref 0.0–0.2)

## 2021-03-12 LAB — COMPREHENSIVE METABOLIC PANEL
ALT: 15 U/L (ref 0–44)
AST: 20 U/L (ref 15–41)
Albumin: 4 g/dL (ref 3.5–5.0)
Alkaline Phosphatase: 155 U/L — ABNORMAL HIGH (ref 38–126)
Anion gap: 10 (ref 5–15)
BUN: 44 mg/dL — ABNORMAL HIGH (ref 8–23)
CO2: 21 mmol/L — ABNORMAL LOW (ref 22–32)
Calcium: 9.2 mg/dL (ref 8.9–10.3)
Chloride: 109 mmol/L (ref 98–111)
Creatinine, Ser: 1.74 mg/dL — ABNORMAL HIGH (ref 0.44–1.00)
GFR, Estimated: 29 mL/min — ABNORMAL LOW (ref >60)
Glucose, Bld: 185 mg/dL — ABNORMAL HIGH (ref 70–99)
Potassium: 4.6 mmol/L (ref 3.5–5.1)
Sodium: 140 mmol/L (ref 135–145)
Total Bilirubin: 0.6 mg/dL (ref 0.3–1.2)
Total Protein: 7.8 g/dL (ref 6.5–8.1)

## 2021-03-12 LAB — CMP (CANCER CENTER ONLY)
ALT: 11 U/L (ref 0–44)
AST: 15 U/L (ref 15–41)
Albumin: 3.5 g/dL (ref 3.5–5.0)
Alkaline Phosphatase: 171 U/L — ABNORMAL HIGH (ref 38–126)
Anion gap: 9 (ref 5–15)
BUN: 41 mg/dL — ABNORMAL HIGH (ref 8–23)
CO2: 24 mmol/L (ref 22–32)
Calcium: 9.3 mg/dL (ref 8.9–10.3)
Chloride: 111 mmol/L (ref 98–111)
Creatinine: 1.93 mg/dL — ABNORMAL HIGH (ref 0.44–1.00)
GFR, Estimated: 26 mL/min — ABNORMAL LOW (ref >60)
Glucose, Bld: 100 mg/dL — ABNORMAL HIGH (ref 70–99)
Potassium: 3.8 mmol/L (ref 3.5–5.1)
Sodium: 144 mmol/L (ref 135–145)
Total Bilirubin: 0.3 mg/dL (ref 0.3–1.2)
Total Protein: 7.2 g/dL (ref 6.5–8.1)

## 2021-03-12 LAB — IRON AND TIBC
Iron: 45 ug/dL (ref 41–142)
Saturation Ratios: 15 % — ABNORMAL LOW (ref 21–57)
TIBC: 306 ug/dL (ref 236–444)
UIBC: 262 ug/dL (ref 120–384)

## 2021-03-12 LAB — CBC WITH DIFFERENTIAL/PLATELET
Abs Immature Granulocytes: 0.05 10*3/uL (ref 0.00–0.07)
Basophils Absolute: 0 10*3/uL (ref 0.0–0.1)
Basophils Relative: 0 %
Eosinophils Absolute: 0 10*3/uL (ref 0.0–0.5)
Eosinophils Relative: 0 %
HCT: 27.9 % — ABNORMAL LOW (ref 36.0–46.0)
Hemoglobin: 9.1 g/dL — ABNORMAL LOW (ref 12.0–15.0)
Immature Granulocytes: 1 %
Lymphocytes Relative: 8 %
Lymphs Abs: 0.6 10*3/uL — ABNORMAL LOW (ref 0.7–4.0)
MCH: 26.1 pg (ref 26.0–34.0)
MCHC: 32.6 g/dL (ref 30.0–36.0)
MCV: 80.2 fL (ref 80.0–100.0)
Monocytes Absolute: 0 10*3/uL — ABNORMAL LOW (ref 0.1–1.0)
Monocytes Relative: 0 %
Neutro Abs: 7.2 10*3/uL (ref 1.7–7.7)
Neutrophils Relative %: 91 %
Platelets: 115 10*3/uL — ABNORMAL LOW (ref 150–400)
RBC: 3.48 MIL/uL — ABNORMAL LOW (ref 3.87–5.11)
RDW: 19.9 % — ABNORMAL HIGH (ref 11.5–15.5)
WBC: 7.9 10*3/uL (ref 4.0–10.5)
nRBC: 0 % (ref 0.0–0.2)

## 2021-03-12 LAB — PROTIME-INR
INR: 1.2 (ref 0.8–1.2)
Prothrombin Time: 14.7 seconds (ref 11.4–15.2)

## 2021-03-12 LAB — LIPASE, BLOOD: Lipase: 75 U/L — ABNORMAL HIGH (ref 11–51)

## 2021-03-12 LAB — FERRITIN: Ferritin: 19 ng/mL (ref 11–307)

## 2021-03-12 LAB — PREPARE RBC (CROSSMATCH)

## 2021-03-12 LAB — SAMPLE TO BLOOD BANK

## 2021-03-12 MED ORDER — SODIUM CHLORIDE 0.9% IV SOLUTION
250.0000 mL | Freq: Once | INTRAVENOUS | Status: AC
Start: 2021-03-12 — End: 2021-03-12
  Administered 2021-03-12: 250 mL via INTRAVENOUS
  Filled 2021-03-12: qty 250

## 2021-03-12 MED ORDER — METHYLPREDNISOLONE SODIUM SUCC 40 MG IJ SOLR
40.0000 mg | Freq: Once | INTRAMUSCULAR | Status: AC
  Administered 2021-03-12: 40 mg via INTRAVENOUS

## 2021-03-12 MED ORDER — METHYLPREDNISOLONE SODIUM SUCC 40 MG IJ SOLR
INTRAMUSCULAR | Status: AC
  Filled 2021-03-12: qty 1

## 2021-03-12 MED ORDER — ACETAMINOPHEN 325 MG PO TABS
ORAL_TABLET | ORAL | Status: AC
  Filled 2021-03-12: qty 2

## 2021-03-12 MED ORDER — ACETAMINOPHEN 325 MG PO TABS: 650.0000 mg | ORAL_TABLET | Freq: Four times a day (QID) | ORAL | Status: DC | PRN

## 2021-03-12 MED ORDER — ACETAMINOPHEN 325 MG PO TABS
650.0000 mg | ORAL_TABLET | Freq: Once | ORAL | Status: AC
Start: 2021-03-12 — End: 2021-03-12
  Administered 2021-03-12: 650 mg via ORAL

## 2021-03-12 MED ORDER — DARBEPOETIN ALFA 500 MCG/ML IJ SOSY
500.0000 ug | PREFILLED_SYRINGE | Freq: Once | INTRAMUSCULAR | Status: AC
  Administered 2021-03-12: 500 ug via SUBCUTANEOUS

## 2021-03-12 MED ORDER — ACETAMINOPHEN 650 MG RE SUPP: 650.0000 mg | Freq: Four times a day (QID) | RECTAL | Status: DC | PRN

## 2021-03-12 MED ORDER — SODIUM CHLORIDE 0.9 % IV SOLN
Freq: Once | INTRAVENOUS | Status: DC
  Filled 2021-03-12: qty 250

## 2021-03-12 MED ORDER — DARBEPOETIN ALFA 500 MCG/ML IJ SOSY
PREFILLED_SYRINGE | INTRAMUSCULAR | Status: AC
  Filled 2021-03-12: qty 1

## 2021-03-12 NOTE — H&P (Signed)
History and Physical    PLEASE NOTE THAT DRAGON DICTATION SOFTWARE WAS USED IN THE CONSTRUCTION OF THIS NOTE.   Susan [name] XAJ:[id number] DOB: 1941-02-10 DOA: 03/12/2021  PCP: Sonia Side., [name] Patient coming from: home   I have personally briefly reviewed patient's old medical records in [place]  Chief Complaint: Bright red blood per rectum  HPI: Susan [name] is a 80 y.o. female with medical history significant for chronic anemia with baseline hemoglobin 7-9, myelodysplastic syndrome, GERD, hypertension, stage IIIb chronic kidney disease with baseline creatinine 1.5-1.8, who is admitted to [facility] on 03/12/2021 with suspected acute lower GI bleed after presenting from home to [facility] ED complaining of bright red blood per rectum.   The patient reports at least 3 episodes of hematochezia over the last 24 hours, with first such episode occurring on the evening of 03/11/2021.  In the setting, and in the context of a history of chronic anemia requiring intermittent PRBC transfusions, the patient notified her outpatient hematologist, Dr. Alen Blew, who ordered CBC earlier this morning, with ensuing results notable for hemoglobin of 6.2 relative to most recent prior value of 7.9 on 02/05/2021.  Received transfusion of 2 units PRBC at the outpatient transfusion center earlier today, during which she experienced 2 additional episodes of hematochezia, prompting Dr. Alen Blew to recommend that the patient present to the emergency department for further evaluation and management of suspected gastrointestinal bleed.  Subsequent to these 2 episodes of hematochezia at the transfusion center, patient denies any ensuing episodes of hematochezia.  Denies any recent melena, nausea, vomiting, abdominal pain, or hematemesis.  No recent trauma.  Denies any associated chest pain, shortness of breath, palpitations, dizziness, presyncope, or syncope.  No recent worsening of edema in the lower extremities.   No recent subjective fever, chills, rigors, or generalized myalgias.  In terms of alcohol consumption, the patient reports that she typically consumes 1 to 2 glasses of wine per month, without any recent increase in frequency relative to this baseline.  Denies any known history of underlying liver disease.  She also reports a history of GERD, for which she is on Protonix twice daily as an outpatient.  The patient reports that she is on a daily baby aspirin at home, but otherwise denies any use of blood thinners on an outpatient basis.  In the setting of osteoarthritis, she reports that she takes a daily Aleve, but no additional NSAID use.  Denies any recent use of systemic corticosteroids, bisphosphonate medications, or supplemental potassium.  Additional notable outpatient medications include scheduled labetalol.  It appears that she underwent EGD on 01/09/2021, although she does not recall the timing of her most recent colonoscopy.  Of note, per chart review, the patient appears to have a history of hemorrhoids.  She was diagnosed with COVID 19 and 2022, with positive test performed on 01/08/2021.  She denies any recent cough, shortness of breath, wheezing.  She also denies any recent headache, neck stiffness, rhinitis, dysuria, gross materia, or change in urinary urgency/frequency.  No known recent COVID-19 exposures.     ED Course:  Vital signs in the ED were notable for the following: Tetramex 97.7, heart rate 60-68; blood pressure 108/75 -136/79; respiratory rate 16-18; oxygen saturation 98 to 100% on room air.  Labs were notable for the following: BMP was notable for the following: Sodium 140, bicarbonate 21, anion gap 10, BUN 44, relative to most recent prior BUN value of 42 on 02/05/2021, creatinine 1.74 relative  to most recent prior value of 1.76 on 02/05/2021.  CBC performed in the ED this evening was notable for the following: Hemoglobin 9.1, relative to value of 6.2 performed this morning prior to  transfusion of 2 units PBC at outpatient infusion center, as further detailed above, and relative to most recent prior hemoglobin of 7.9 on 02/05/2021.  Hemoglobin checked in the ED this evening was associate with normocytic/normochromic findings and an elevated RDW of 20.  INR 1.2.  Type and screen performed in the ED this evening.  Rectal exam associated with fecal occult positive findings.  As the patient is less than 90 days from positive COVID test performed on 01/08/2021, with resolution of respiratory symptoms that she was experiencing at time, there does not appear to be an indication to repeat COVID-19 testing at this time.  The EDP gastroenterologist, with additional recs to follow.  Subsequently, the patient was admitted to the PCU for further evaluation management of suspected acute lower gastrointestinal bleed as well as acute on chronic anemia.    Review of Systems: As per HPI otherwise 10 point review of systems negative.   Past Medical History:  Diagnosis Date   Anemia    Arthritis    Blood transfusion without reported diagnosis    GERD (gastroesophageal reflux disease)    HLD (hyperlipidemia)    Hypertension    Hypothyroidism    Memory loss    Myelodysplasia    Vitamin D deficiency     Past Surgical History:  Procedure Laterality Date   BIOPSY  01/09/2021   Procedure: BIOPSY;  Surgeon: Milus Banister, MD;  Location: [facility];  Service: Endoscopy;;   CARPAL TUNNEL RELEASE Right    DILATION AND CURETTAGE OF UTERUS     ESOPHAGOGASTRODUODENOSCOPY (EGD) WITH PROPOFOL N/A 01/09/2021   Procedure: ESOPHAGOGASTRODUODENOSCOPY (EGD) WITH PROPOFOL;  Surgeon: Milus Banister, MD;  Location: [facility] ENDOSCOPY;  Service: Endoscopy;  Laterality: N/A;   INGUINAL HERNIA REPAIR     ROTATOR CUFF REPAIR Left    TRANSANAL HEMORRHOIDAL DEARTERIALIZATION N/A 03/31/2017   Procedure: TRANSANAL HEMORRHOIDAL DEARTERIALIZATION;  Surgeon: Leighton Ruff, MD;  Location: [facility] ORS;  Service: General;   Laterality: N/A;   UMBILICAL HERNIA REPAIR      Social History:  reports that she quit smoking about 24 years ago. Her smoking use included cigarettes. She has never used smokeless tobacco. She reports current alcohol use. She reports that she does not use drugs.   Allergies  Allergen Reactions   Codeine Anaphylaxis   Penicillins Anaphylaxis   Bactrim Itching and Swelling   Clarithromycin Other (See Comments)    "Caused skin to peel off my hand."   Flagyl [Metronidazole Hcl] Itching and Swelling   Food Other (See Comments)    EATS WHITE RICE ONLY (ALLERGIC REACTION TO YELLOW OR BROWN RICE THAT RESULTED IN HOSPITALIZATION)   Pineapple Itching and Swelling    SWELLING OF EYES   Sulfa Antibiotics Itching and Swelling    Family History  Problem Relation Age of Onset   Stroke Father    Hypertension Father    Other Mother        died at early age   Stroke Sister    Coronary artery disease Brother    Hypertension Sister    Bladder Cancer Sister    Colon cancer Neg Hx    Stomach cancer Neg Hx     Family history reviewed and not pertinent    Prior to Admission medications   Medication Sig  Start Date End Date Taking? Authorizing Provider  acetaminophen (TYLENOL) 500 MG tablet Take 500-1,000 mg by mouth every 6 (six) hours as needed (for pain.).    [provider]  allopurinol (ZYLOPRIM) 300 MG tablet Take 300 mg by mouth daily. 07/20/20   [provider]  amLODipine (NORVASC) 10 MG tablet TAKE 1/2 TABLET BY MOUTH DAILY AT BEDTIME; (TAKE 1 TABLET IF SBP IS >150) Patient taking differently: Take 10 mg by mouth at bedtime. (TAKE 1 TABLET IF SBP IS >150) 07/20/17   [name], NP  Cholecalciferol (VITAMIN D-3) 5000 UNITS TABS Take 15,000 Units by mouth daily.     [provider]  Darbepoetin Alfa 300 MCG/ML SOLN Inject 300 mcg into the skin See admin instructions. USE INJECTION EVERY 3 WEEKS AS NEEDED FOR HEMOGLOBIN LESS THAN 11.    [provider]  diphenhydrAMINE (BENADRYL) 25 mg capsule One capsule every 6 hours x 3 days, then as needed for symptoms of rash/itching 03/25/17   Charlann Lange, PA-C  donepezil (ARICEPT) 10 MG tablet Take 10 mg by mouth daily. 07/20/20   [provider]  fluticasone (FLONASE) 50 MCG/ACT nasal spray Place 1 spray into both nostrils in the morning and at bedtime. 12/17/20   [provider]  hydroxypropyl methylcellulose / hypromellose (ISOPTO TEARS / GONIOVISC) 2.5 % ophthalmic solution Place 1-2 drops into both eyes 3 (three) times daily as needed for dry eyes.    [provider]  labetalol (NORMODYNE) 200 MG tablet Take 200 mg by mouth 2 (two) times daily. 06/23/20   [provider]  levothyroxine (SYNTHROID) 75 MCG tablet Take 75 mcg by mouth every morning. 12/18/20   [provider]  memantine (NAMENDA) 10 MG tablet Take 1 tablet (10 mg total) by mouth 2 (two) times daily. 11/30/20   [name], NP  mirtazapine (REMERON) 15 MG tablet Take 15 mg by mouth at bedtime. 07/20/20   [provider]  montelukast (SINGULAIR) 10 MG tablet SMARTSIG:1 Tablet(s) By Mouth Every Evening 12/14/20   [provider]  pantoprazole (PROTONIX) 40 MG tablet Take 1 tablet (40 mg total) by mouth 2 (two) times daily before a meal. 01/10/21   Little Ishikawa, MD  vitamin E 200 UNIT capsule Take 200 Units by mouth daily.    [provider]     Objective    Physical Exam: Vitals:   03/12/21 2115 03/12/21 2130 03/12/21 2145 03/12/21 2215  BP: 117/78 126/74 118/68 106/67  Pulse: 66 68 62 63  Resp: 17  18 17   Temp:      TempSrc:      SpO2: 100% 100% 99% 99%    General: appears to be stated age; alert, oriented Skin: warm, dry, no rash Head:  AT/[place] Mouth:  Oral mucosa membranes appear dry, normal dentition Neck: supple; trachea midline Heart:  RRR; did not appreciate any M/R/G Lungs: CTAB, did not appreciate any wheezes, rales, or  rhonchi Abdomen: + BS; soft, ND, NT Vascular: 2+ pedal pulses b/l; 2+ radial pulses b/l Extremities: no peripheral edema, no muscle wasting Neuro: strength and sensation intact in upper and lower extremities b/l   Labs on Admission: I have personally reviewed following labs and imaging studies  CBC: Recent Labs  Lab 03/12/21 1032 03/12/21 2016  WBC 7.6 7.9  NEUTROABS 5.8 7.2  HGB 6.2* 9.1*  HCT 19.5* 27.9*  MCV 75.9* 80.2  PLT 127* 810*   Basic Metabolic Panel: Recent Labs  Lab 03/12/21 1032  03/12/21 2016  NA 144 140  K 3.8 4.6  CL 111 109  CO2 24 21*  GLUCOSE 100* 185*  BUN 41* 44*  CREATININE 1.93* 1.74*  CALCIUM 9.3 9.2   GFR: Estimated Creatinine Clearance: 22.4 mL/min (A) (by C-G formula based on SCr of 1.74 mg/dL (H)). Liver Function Tests: Recent Labs  Lab 03/12/21 1032 03/12/21 2016  AST 15 20  ALT 11 15  ALKPHOS 171* 155*  BILITOT 0.3 0.6  PROT 7.2 7.8  ALBUMIN 3.5 4.0   Recent Labs  Lab 03/12/21 2016  LIPASE 75*   No results for input(s): AMMONIA in the last 168 hours. Coagulation Profile: Recent Labs  Lab 03/12/21 2016  INR 1.2   Cardiac Enzymes: No results for input(s): CKTOTAL, CKMB, CKMBINDEX, TROPONINI in the last 168 hours. BNP (last 3 results) No results for input(s): PROBNP in the last 8760 hours. HbA1C: No results for input(s): HGBA1C in the last 72 hours. CBG: No results for input(s): GLUCAP in the last 168 hours. Lipid Profile: No results for input(s): CHOL, HDL, LDLCALC, TRIG, CHOLHDL, LDLDIRECT in the last 72 hours. Thyroid Function Tests: No results for input(s): TSH, T4TOTAL, FREET4, T3FREE, THYROIDAB in the last 72 hours. Anemia Panel: Recent Labs    03/12/21 1032  FERRITIN 19  TIBC 306  IRON 45   Urine analysis:    Component Value Date/Time   COLORURINE DARK YELLOW 11/07/2017 1455   APPEARANCEUR CLOUDY (A) 11/07/2017 1455   LABSPEC 1.025 11/07/2017 1455   PHURINE < OR = 5.0 11/07/2017 1455   GLUCOSEU  NEGATIVE 11/07/2017 1455   HGBUR NEGATIVE 11/07/2017 1455   BILIRUBINUR NEGATIVE 04/11/2016 1114   KETONESUR TRACE (A) 11/07/2017 1455   PROTEINUR 2+ (A) 11/07/2017 1455   UROBILINOGEN 1.0 07/07/2015 1917   NITRITE NEGATIVE 11/07/2017 1455   LEUKOCYTESUR NEGATIVE 11/07/2017 1455    Radiological Exams on Admission: No results found.    Assessment/Plan   Ileana Chalupa Mckown is a 80 y.o. female with medical history significant for chronic anemia with baseline hemoglobin 7-9, myelodysplastic syndrome, GERD, hypertension, stage IIIb chronic kidney disease with baseline creatinine 1.5-1.8, who is admitted to [facility] on 03/12/2021 with suspected acute lower GI bleed after presenting from home to [facility] ED complaining of bright red blood per rectum.    Principal Problem:   Acute lower GI bleeding Active Problems:   Essential hypertension   Hypothyroidism   GERD (gastroesophageal reflux disease)   CKD, 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)   Acute on chronic anemia   Allergic rhinitis      #) Acute lower GI bleed: diagnosis on the basis of 3 episodes of hematochezia over the last 24 hours with fecal occult blood positive finding in the ED today, in addition to findings of acute on chronic anemia with elevated RDW prompting transfusion of 2 units PRBC at outpatient transfusion center earlier today, as further quantified below.  Presenting BUN of 44 is stable relative to most recent prior value 42 on 02/05/2021, further decreasing likelihood of an upper gastrointestinal source, particularly given her relative asymptomatic nature as well as hemodynamic stability with a hemoglobin initially of 6.   Outpatient hemoglobin earlier this morning found to be 6.2 prompting transfusion of 2 units PRBC at outpatient transfusion center, with repeat CBC performed upon arrival in the emergency department this evening conferring interval increase in hemoglobin to 9.1.  As the patient appears hemodynamically stable with  normotensive/nontachycardic vital signs in the absence of any additional symptoms  at this time, will refrain from additional PRBC transfusion for now, will closely monitoring ensuing hemoglobin trend via every 4 hour H&H values.  Presenting INR found to be 1.2, and the patient was typed and screened in the ED today.  She is on a daily baby aspirin patient, but otherwise no additional blood thinners.  No reported history of alcohol abuse.  No known history of liver disease to warrant SBP prophylaxis.  Of note, patient does report that she takes a daily Aleve as an outpatient, no outpatient use of NSAIDs.  Denies any recent abdominal pain.  Differential includes diverticular bleed, colorectal malignancy, bleeding colonic polyps, AVM's, hemorrhoids.  Given the suspected lower GI source, there does not appear to be an indication for initiation of Protonix drip.  EDP consulted on-call GI physician, with additional recs to follow.       Plan: NPO. Refraining from pharmacologic DVT prophylaxis. Monitor on telemetry. Monitor continuous pulse-ox. Maintain at least 2 large bore IV's. Check INR in the AM. Q4H H&H's have been ordered through9 AM on 03/13/21. Will closely monitor these ensuing Hgb levels and correlate these data points with the patient's overall clinical picture including vital signs to determine need for subsequent transfusion.  GI consulted, as above, with additional recs to follow.  Hold home daily baby aspirin.  Hold home Aleve.      #) Acute blood loss anemia superimposed on chronic anemia: In the setting of a history of chronic anemia with myelodysplastic syndrome, baseline hemoglobin noted to be in the range of 7-9, with most recent prior value found to be 7.9 on 02/05/2021 . in the setting of suspected acute lower GI bleed, as above, initial tenderness 22 found to be 6.2, with greater than anticipated improvement in hemoglobin following interval transfusion of 2 units PBC as an outpatient,  with presenting hemoglobin noted to be 9.1.  At this time, patient appears hemodynamically stable and asymptomatic, as further described above.  Iron studies added on to pretransfusion labs, with results currently pending.   Plan: work-up and management for presenting suspected acute lower GI bleed, as above, including close monitoring of Q4H H&H's, with clinical evaluation for determination of need for blood transfusion, as further described above. Monitor on telemetry. Monitor continuous pulse-ox. NPO. Refraining from pharmacologic DVT prophylaxis. Check INR in the morning.  Follow for result of iron studies added onto pretransfusion labs to evaluate for indication for iron transfusion.  Hold home aspirin and Aleve, as above.  Gastroenterology consulted, with additional recommendations to follow.      #) GERD: Documented history of such for which the patient is on Protonix 40 mg p.o. twice daily as an outpatient.  Plan: In the setting of n.p.o. status, will hold.  Rather, have ordered Protonix 40 mg IV twice daily, with first dose to occur this evening.      #) Essential hypertension: Outpatient hypertensive regimen includes Norvasc as well as labetalol.  In setting of suspected acute lower gastrointestinal bleed as well as associated acute on chronic anemia, will hold home antihypertensive medications for now.  Plan: Hold home blood pressure medications for now, as above.  Close monitoring of ensuing blood pressure via routine vital signs.      #) Acquired hypothyroidism: On Synthroid as an outpatient.  Plan: In setting of current n.p.o. status, will hold home Synthroid for now.      #) Allergic rhinitis: On scheduled Flonase as an outpatient. Plan: Continue home Flonase.       #)  Stage IIIb chronic kidney disease: Associated with baseline creatinine range of 1.5-1.8, with presenting serum creatinine found to be consistent with this range.  Plan: Monitor strict I's and  O's and daily weights, and attempt to avoid nephrotoxic agents.  Repeat BMP in the morning.       DVT prophylaxis: SCDs Code Status: Full code Family Communication: none Disposition Plan: Per Rounding Team;  Consults called: on-call GI consulted, as above Admission status: Observation, PCU     Of note, this patient was added by me to the following Admit List/Treatment Team: [email or web address].      PLEASE NOTE THAT DRAGON DICTATION SOFTWARE WAS USED IN THE CONSTRUCTION OF THIS NOTE.   [facility] Pager [phone] From [place]  Otherwise, please contact night-coverage  [email or web address] Password [facility]   03/12/2021, 10:35 PM

## 2021-03-12 NOTE — Patient Instructions (Signed)
Darbepoetin Alfa injection What is this medication? DARBEPOETIN ALFA (dar be POE e tin AL fa) helps your body make more red blood cells. It is used to treat anemia caused by chronic kidney failure and chemotherapy. This medicine may be used for other purposes; ask your health care provider or pharmacist if you have questions. COMMON BRAND NAME(S): Aranesp What should I tell my care team before I take this medication? They need to know if you have any of these conditions: blood clotting disorders or history of blood clots cancer patient not on chemotherapy cystic fibrosis heart disease, such as angina, heart failure, or a history of a heart attack hemoglobin level of 12 g/dL or greater high blood pressure low levels of folate, iron, or vitamin B12 seizures an unusual or allergic reaction to darbepoetin, erythropoietin, albumin, hamster proteins, latex, other medicines, foods, dyes, or preservatives pregnant or trying to get pregnant breast-feeding How should I use this medication? This medicine is for injection into a vein or under the skin. It is usually given by a health care professional in a hospital or clinic setting. If you get this medicine at home, you will be taught how to prepare and give this medicine. Use exactly as directed. Take your medicine at regular intervals. Do not take your medicine more often than directed. It is important that you put your used needles and syringes in a special sharps container. Do not put them in a trash can. If you do not have a sharps container, call your pharmacist or healthcare provider to get one. A special MedGuide will be given to you by the pharmacist with each prescription and refill. Be sure to read this information carefully each time. Talk to your pediatrician regarding the use of this medicine in children. While this medicine may be used in children as young as 1 month of age for selected conditions, precautions do apply. Overdosage: If you  think you have taken too much of this medicine contact a poison control center or emergency room at once. NOTE: This medicine is only for you. Do not share this medicine with others. What if I miss a dose? If you miss a dose, take it as soon as you can. If it is almost time for your next dose, take only that dose. Do not take double or extra doses. What may interact with this medication? Do not take this medicine with any of the following medications: epoetin alfa This list may not describe all possible interactions. Give your health care provider a list of all the medicines, herbs, non-prescription drugs, or dietary supplements you use. Also tell them if you smoke, drink alcohol, or use illegal drugs. Some items may interact with your medicine. What should I watch for while using this medication? Your condition will be monitored carefully while you are receiving this medicine. You may need blood work done while you are taking this medicine. This medicine may cause a decrease in vitamin B6. You should make sure that you get enough vitamin B6 while you are taking this medicine. Discuss the foods you eat and the vitamins you take with your health care professional. What side effects may I notice from receiving this medication? Side effects that you should report to your doctor or health care professional as soon as possible: allergic reactions like skin rash, itching or hives, swelling of the face, lips, or tongue breathing problems changes in vision chest pain confusion, trouble speaking or understanding feeling faint or lightheaded, falls high blood pressure   muscle aches or pains pain, swelling, warmth in the leg rapid weight gain severe headaches sudden numbness or weakness of the face, arm or leg trouble walking, dizziness, loss of balance or coordination seizures (convulsions) swelling of the ankles, feet, hands unusually weak or tired Side effects that usually do not require medical  attention (report to your doctor or health care professional if they continue or are bothersome): diarrhea fever, chills (flu-like symptoms) headaches nausea, vomiting redness, stinging, or swelling at site where injected This list may not describe all possible side effects. Call your doctor for medical advice about side effects. You may report side effects to FDA at 1-800-FDA-1088. Where should I keep my medication? Keep out of the reach of children. Store in a refrigerator between 2 and 8 degrees C (36 and 46 degrees F). Do not freeze. Do not shake. Throw away any unused portion if using a single-dose vial. Throw away any unused medicine after the expiration date. NOTE: This sheet is a summary. It may not cover all possible information. If you have questions about this medicine, talk to your doctor, pharmacist, or health care provider.  2022 Elsevier/Gold Standard (2017-10-04 16:44:20)  

## 2021-03-12 NOTE — Progress Notes (Signed)
Brief note regarding plan, with full H&P to follow:  80 year old female with multifactorial chronic anemia requiring intermittent transfusions, is admitted with acute lower gastrointestinal bleed after presenting from outpatient transfusion center at the instruction of her heme-onc provider for further evaluation of 1 day of hematochezia.  The patient reports an episode of hematochezia on the evening of 03/11/2021.  Ensuing outpatient labs on 03/12/2021 reflected acute on chronic anemia with hemoglobin at that time noted to be 6.2, prompting patient to receive 2 units PRBC at outpatient infusion center earlier today.  However, while at the infusion center, the patient reportedly experienced 2 additional episodes of hematochezia, prompting her heme-onc physician, Dr. Alen Blew, to instruct the patient to present to the emergency department for further evaluation and management of the above.  She is currently asymptomatic and appears hemodynamically stable.  The EDP has consulted on - call gastroenterology, with associated recommendations pending at this time. Will closely monitor ensuing hemoglobin trend via every 4 hour H&H's ordered through 9 AM tomorrow morning.  Patient has been typed and screened.  NPO.  SCDs.  Refraining from pharmacologic DVT prophylaxis.     Babs Bertin, DO Hospitalist

## 2021-03-12 NOTE — Progress Notes (Signed)
ED charge RN notified of completion of 2nd unit of blood. pt to bed 7, report to be given to [place], Therapist, sports. Pt IV left in place and pt notified family of plans and she is agreeable.

## 2021-03-12 NOTE — Telephone Encounter (Signed)
CRITICAL VALUE STICKER  CRITICAL VALUE: Hgb = 6.2  RECEIVER (on-site recipient of call): Yetta Glassman, [place] NOTIFIED: 03/12/21 at 10:40am  MESSENGER (representative from lab): Pam  MD NOTIFIED: Dr. Alen Blew  TIME OF NOTIFICATION: 03/12/21 at 10:42am  RESPONSE: Provider is aware. Pt is to have blood transfusion 6/11 and provider to see the pt per pt request.

## 2021-03-12 NOTE — Progress Notes (Signed)
Took pt. to the restroom, pt had moderate amount of bright red blood in urine and stool. Pt. states she has been weak. Pt. to receive 2 units of blood. Dr. Alen Blew notified. Per Dr. Alen Blew, ok to run blood at 300cc/hour. Pt. up to restroom a second time and again moderate amount bright red blood noted in urine and stool. Dr. Alen Blew notified and pt. to go to ED after transfusion. Ed Camera operator notified and be to be available after 2nd unit of blood transfusion completed.

## 2021-03-12 NOTE — ED Triage Notes (Signed)
Pt was at cancer center today for blood transfusion. Pt had bright red blood in stool beginning last night. Pt received 2 units of blood at cancer center. Pt endorses dizziness and generalized weakness. Pt has chronic anemia. Hemoglobin was 6.9 prior to transfusion.

## 2021-03-12 NOTE — Progress Notes (Signed)
Hematology and Oncology Follow Up Visit  Susan [name] [id number] 04-29-1941 80 y.o. 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    Current therapy:  Aranesp 500 mcg every 4 weeks to keep her hemoglobin above 11.    Interim History:  Susan [name] returns today for repeat evaluation.  Since the last visit, she reports no major changes in her health.  She does report some more fatigue tiredness and weakness but still able to attend to most activities of daily living.  She does ambulate with the help of a cane without any falls or syncope.  He denies any chest pain palpitation or dyspnea on exertion.  She has reported occasional hematochezia in the last 2 days with predominantly blood on the tissue paper.  She denies any hemoptysis or melena.       Medications: Updated on review. Current Outpatient Medications  Medication Sig Dispense Refill   acetaminophen (TYLENOL) 500 MG tablet Take 500-1,000 mg by mouth every 6 (six) hours as needed (for pain.).     allopurinol (ZYLOPRIM) 300 MG tablet Take 300 mg by mouth daily.     amLODipine (NORVASC) 10 MG tablet TAKE 1/2 TABLET BY MOUTH DAILY AT BEDTIME; (TAKE 1 TABLET IF SBP IS >150) (Patient taking differently: Take 10 mg by mouth at bedtime. (TAKE 1 TABLET IF SBP IS >150)) 90 tablet 0   Cholecalciferol (VITAMIN D-3) 5000 UNITS TABS Take 15,000 Units by mouth daily.      Darbepoetin Alfa 300 MCG/ML SOLN Inject 300 mcg into the skin See admin instructions. USE INJECTION EVERY 3 WEEKS AS NEEDED FOR HEMOGLOBIN LESS THAN 11.     diphenhydrAMINE (BENADRYL) 25 mg capsule One capsule every 6 hours x 3 days, then as needed for symptoms of rash/itching 30 capsule 0   donepezil (ARICEPT) 10 MG tablet Take 10 mg by mouth daily.     fluticasone (FLONASE) 50 MCG/ACT nasal spray Place 1 spray into  both nostrils in the morning and at bedtime.     hydroxypropyl methylcellulose / hypromellose (ISOPTO TEARS / GONIOVISC) 2.5 % ophthalmic solution Place 1-2 drops into both eyes 3 (three) times daily as needed for dry eyes.     labetalol (NORMODYNE) 200 MG tablet Take 200 mg by mouth 2 (two) times daily.     levothyroxine (SYNTHROID) 75 MCG tablet Take 75 mcg by mouth every morning.     memantine (NAMENDA) 10 MG tablet Take 1 tablet (10 mg total) by mouth 2 (two) times daily. 60 tablet 11   mirtazapine (REMERON) 15 MG tablet Take 15 mg by mouth at bedtime.     montelukast (SINGULAIR) 10 MG tablet SMARTSIG:1 Tablet(s) By Mouth Every Evening     pantoprazole (PROTONIX) 40 MG tablet Take 1 tablet (40 mg total) by mouth 2 (two) times daily before a meal. 60 tablet 0   vitamin E 200 UNIT capsule Take 200 Units by mouth daily.     No current facility-administered medications for this visit.       Physical Exam:    Blood pressure 100/68, pulse (!) 57, temperature 98.3 F (36.8 C), temperature source Oral, resp. rate 17, weight 142 lb 8 oz (64.6 kg), SpO2 100 %.    ECOG: 1     General appearance: Alert, awake without any distress. Head: Atraumatic without abnormalities Oropharynx: Without any thrush  or ulcers. Eyes: No scleral icterus. Lymph nodes: No lymphadenopathy noted in the cervical, supraclavicular, or axillary nodes Heart:regular rate and rhythm, without any murmurs or gallops.   Lung: Clear to auscultation without any rhonchi, wheezes or dullness to percussion. Abdomin: Soft, nontender without any shifting dullness or ascites. Musculoskeletal: No clubbing or cyanosis. Neurological: No motor or sensory deficits. Skin: No rashes or lesions.         Lab Results: Lab Results  Component Value Date   WBC 7.6 03/12/2021   HGB 6.2 (LL) 03/12/2021   HCT 19.5 (L) 03/12/2021   MCV 75.9 (L) 03/12/2021   PLT 127 (L) 03/12/2021     Chemistry      Component Value  Date/Time   NA 143 02/05/2021 0914   NA 143 06/30/2017 0846   K 4.3 02/05/2021 0914   K 4.4 06/30/2017 0846   CL 109 02/05/2021 0914   CO2 25 02/05/2021 0914   CO2 24 06/30/2017 0846   BUN 42 (H) 02/05/2021 0914   BUN 26.5 (H) 06/30/2017 0846   CREATININE 1.76 (H) 02/05/2021 0914   CREATININE 1.31 (H) 07/02/2018 0950   CREATININE 1.4 (H) 06/30/2017 0846      Component Value Date/Time   CALCIUM 9.7 02/05/2021 0914   CALCIUM 10.1 06/30/2017 0846   ALKPHOS 134 (H) 02/05/2021 0914   ALKPHOS 101 06/30/2017 0846   AST 22 02/05/2021 0914   AST 17 06/30/2017 0846   ALT 9 02/05/2021 0914   ALT <6 06/30/2017 0846   BILITOT 0.4 02/05/2021 0914   BILITOT 0.76 06/30/2017 0846      Impression and Plan:   80 year old woman with:   1.  Multifactorial anemia related to chronic kidney disease and element of myelodysplasia diagnosed in 2010.    She has been receiving supportive care with packed red cell transfusion as well as growth factor support as needed.  CBC from today showed a hemoglobin of 6.2 with low MCV and white cell count of 7.6 and a platelet count of 127.   She has reported episodes of hematochezia but appears to be limited at this time without any evidence of melena.Laboratory work-up from today is currently pending including repeat iron studies and chemistries    From a management standpoint, the plan is to proceed with packed red cell transfusion for supportive care at this time.  She also received Aranesp today.  Additional intravenous iron infusion could be arranged for pending her iron levels.  I also gave her the option for urgent evaluation in the emergency department but she appears to be stable hemodynamically and feels reasonably fair and can wait till tomorrow to be managed outpatient.  2.  Thrombocytopenia: Platelet count is 127 without any active bleeding.  3.  Hematochezia: Appears to be self-limited and she has been evaluated by GI in the past.  I recommended  repeat evaluation at this time.  4.  Follow-up: I recommended continue monthly Aranesp and MD follow-up as scheduled.  30  minutes were spent on this encounter.  The time was dedicated to reviewing laboratory data, disease status update and outlining future plan of care.         Zola Button MD 6/10/202210:42 AM

## 2021-03-12 NOTE — Patient Instructions (Signed)
Blood Transfusion, Adult, Care After This sheet gives you information about how to care for yourself after your procedure. Your doctor may also give you more specific instructions. If youhave problems or questions, contact your doctor. What can I expect after the procedure? After the procedure, it is common to have: Bruising and soreness at the IV site. A fever or chills on the day of the procedure. This may be your body's response to the new blood cells received. A headache. Follow these instructions at home: Insertion site care     Follow instructions from your doctor about how to take care of your insertion site. This is where an IV tube was put into your vein. Make sure you: Wash your hands with soap and water before and after you change your bandage (dressing). If you cannot use soap and water, use hand sanitizer. Change your bandage as told by your doctor. Check your insertion site every day for signs of infection. Check for: Redness, swelling, or pain. Bleeding from the site. Warmth. Pus or a bad smell. General instructions Take over-the-counter and prescription medicines only as told by your doctor. Rest as told by your doctor. Go back to your normal activities as told by your doctor. Keep all follow-up visits as told by your doctor. This is important. Contact a doctor if: You have itching or red, swollen areas of skin (hives). You feel worried or nervous (anxious). You feel weak after doing your normal activities. You have redness, swelling, warmth, or pain around the insertion site. You have blood coming from the insertion site, and the blood does not stop with pressure. You have pus or a bad smell coming from the insertion site. Get help right away if: You have signs of a serious reaction. This may be coming from an allergy or the body's defense system (immune system). Signs include: Trouble breathing or shortness of breath. Swelling of the face or feeling warm  (flushed). Fever or chills. Head, chest, or back pain. Dark pee (urine) or blood in the pee. Widespread rash. Fast heartbeat. Feeling dizzy or light-headed. You may receive your blood transfusion in an outpatient setting. If so, youwill be told whom to contact to report any reactions. These symptoms may be an emergency. Do not wait to see if the symptoms will go away. Get medical help right away. Call your local emergency services (911 in the [place] Do not drive yourself to the hospital. Summary Bruising and soreness at the IV site are common. Check your insertion site every day for signs of infection. Rest as told by your doctor. Go back to your normal activities as told by your doctor. Get help right away if you have signs of a serious reaction. This information is not intended to replace advice given to you by your health care provider. Make sure you discuss any questions you have with your healthcare provider. Document Revised: 03/14/2019 Document Reviewed: 03/14/2019 Elsevier Patient Education  2022 Elsevier Inc.  

## 2021-03-12 NOTE — ED Provider Notes (Signed)
[facility] DEPT Provider Note   CSN: [id number] Arrival date & time: 03/12/21  1814     History Chief Complaint  Patient presents with   Rectal Bleeding    Susan [name] is a 80 y.o. female.  HPI Patient has history of multifactorial anemia with chronic kidney disease and myelodysplasia.  She routinely goes to the transfusion center for packed red blood cells and growth factor as needed.  She was seen today and initial hemoglobin was 6.7.  Patient was infused 2 units today.  Patient reports that last night she started having bloody bowel movements.  She reports that she was seeing clots as well as red blood.  She reports that when she was at the transfusion center this happened 2 more times.  She reports she filled the toilet with quite a bit of blood and clot.  She reports she started to feel lightheaded.  She was subsequently referred to the emergency department for further evaluation and monitoring.    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   BMI 29.0-29.9,adult 09/02/2015   Encounter for Medicare annual wellness exam 06/02/2015   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[place]) 11/03/2014   CKD, 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) 11/02/2014   Medication management 01/27/2014   Hyperlipidemia 08/27/2013   Essential hypertension    Hypothyroidism    GERD (gastroesophageal reflux disease)    Vitamin D deficiency    Hemorrhoids 07/15/2013     Past Surgical History:  Procedure Laterality Date   BIOPSY  01/09/2021   Procedure: BIOPSY;  Surgeon: Milus Banister, MD;  Location: [facility];  Service: Endoscopy;;   CARPAL TUNNEL RELEASE Right    DILATION AND CURETTAGE OF UTERUS     ESOPHAGOGASTRODUODENOSCOPY (EGD) WITH PROPOFOL N/A 01/09/2021   Procedure: ESOPHAGOGASTRODUODENOSCOPY (EGD) WITH PROPOFOL;  Surgeon: Milus Banister, MD;  Location: [facility];  Service: Endoscopy;  Laterality: N/A;   INGUINAL HERNIA REPAIR     ROTATOR CUFF REPAIR Left    TRANSANAL HEMORRHOIDAL DEARTERIALIZATION N/A 03/31/2017   Procedure: TRANSANAL HEMORRHOIDAL DEARTERIALIZATION;  Surgeon: Leighton Ruff, MD;  Location: [facility] ORS;  Service: General;  Laterality: N/A;   UMBILICAL HERNIA REPAIR       OB History   No obstetric history on file.     Family History  Problem Relation Age of Onset   Stroke Father    Hypertension Father    Other Mother        died at early age   Stroke Sister    Coronary artery disease Brother    Hypertension Sister    Bladder Cancer Sister    Colon cancer Neg Hx    Stomach cancer Neg Hx     Social History   Tobacco Use   Smoking status: Former    Pack years: 0.00    Types: Cigarettes    Quit date: 10/02/1996    Years  since quitting: 24.4   Smokeless tobacco: Never  Vaping Use   Vaping Use: Never used  Substance Use Topics   Alcohol use: Yes    Comment: glass of wine at least once a month.   Drug use: No    Home Medications Prior to Admission medications   Medication Sig Start Date End Date Taking? Authorizing Provider  acetaminophen (TYLENOL) 500 MG tablet Take 500-1,000 mg by mouth every 6 (six) hours as needed (for pain.).    [provider]  allopurinol (ZYLOPRIM) 300 MG tablet Take 300 mg by mouth daily. 07/20/20   [provider]  amLODipine (NORVASC) 10 MG tablet TAKE 1/2 TABLET BY MOUTH DAILY AT BEDTIME; (TAKE 1 TABLET IF SBP IS >150) Patient taking differently: Take 10  mg by mouth at bedtime. (TAKE 1 TABLET IF SBP IS >150) 07/20/17   [name], NP  Cholecalciferol (VITAMIN D-3) 5000 UNITS TABS Take 15,000 Units by mouth daily.     [provider]  Darbepoetin Alfa 300 MCG/ML SOLN Inject 300 mcg into the skin See admin instructions. USE INJECTION EVERY 3 WEEKS AS NEEDED FOR HEMOGLOBIN LESS THAN 11.    [provider]  diphenhydrAMINE (BENADRYL) 25 mg capsule One capsule every 6 hours x 3 days, then as needed for symptoms of rash/itching 03/25/17   Charlann Lange, PA-C  donepezil (ARICEPT) 10 MG tablet Take 10 mg by mouth daily. 07/20/20   [provider]  fluticasone (FLONASE) 50 MCG/ACT nasal spray Place 1 spray into both nostrils in the morning and at bedtime. 12/17/20   [provider]  hydroxypropyl methylcellulose / hypromellose (ISOPTO TEARS / GONIOVISC) 2.5 % ophthalmic solution Place 1-2 drops into both eyes 3 (three) times daily as needed for dry eyes.    [provider]  labetalol (NORMODYNE) 200 MG tablet Take 200 mg by mouth 2 (two) times daily. 06/23/20   [provider]  levothyroxine (SYNTHROID) 75 MCG tablet Take 75 mcg by mouth every morning. 12/18/20   [provider]  memantine (NAMENDA) 10 MG tablet Take 1 tablet (10 mg total) by mouth 2 (two) times daily. 11/30/20   [name], NP  mirtazapine (REMERON) 15 MG tablet Take 15 mg by mouth at bedtime. 07/20/20   [provider]  montelukast (SINGULAIR) 10 MG tablet SMARTSIG:1 Tablet(s) By Mouth Every Evening 12/14/20   [provider]  pantoprazole (PROTONIX) 40 MG tablet Take 1 tablet (40 mg total) by mouth 2 (two) times daily before a meal. 01/10/21   Little Ishikawa, MD  vitamin E 200 UNIT capsule Take 200 Units by mouth daily.    [provider]    Allergies    Codeine, Penicillins, Bactrim, Clarithromycin, Flagyl [metronidazole hcl], Food, Pineapple, and Sulfa antibiotics  Review of Systems    Review of Systems 10 systems are negative except as per HPI Physical Exam Updated Vital Signs BP 118/68   Pulse 62   Temp 97.7 F (36.5 C) (Oral)   Resp 18   SpO2 99%   Physical Exam Constitutional:      General: She is not in acute distress.    Appearance: She is well-developed.  HENT:     Head: Normocephalic and atraumatic.     Mouth/Throat:     Pharynx: Oropharynx is clear.  Eyes:     Extraocular Movements: Extraocular movements intact.  Cardiovascular:     Rate and Rhythm: Normal rate and regular rhythm.     Heart sounds: Normal heart  sounds.  Pulmonary:     Effort: Pulmonary effort is normal.     Breath sounds: Normal breath sounds.  Abdominal:     General: Bowel sounds are normal. There is no distension.     Palpations: Abdomen is soft.     Tenderness: There is no abdominal tenderness.  Genitourinary:    Comments: Rectal exam has some small amounts of reddish clot present mixed with darker stool. Musculoskeletal:        General: No swelling or tenderness.     Cervical back: Neck supple.     Right lower leg: No edema.     Left lower leg: No edema.  Skin:    General: Skin is warm and dry.  Neurological:     General: No focal deficit present.     Mental Status: She is alert and oriented to person, place, and time.     GCS: GCS eye subscore is 4. GCS verbal subscore is 5. GCS motor subscore is 6.    ED Results / Procedures / Treatments   Labs (all labs ordered are listed, but only abnormal results are displayed) Labs Reviewed  COMPREHENSIVE METABOLIC PANEL - Abnormal; Notable for the following components:      Result Value   CO2 21 (*)    Glucose, Bld 185 (*)    BUN 44 (*)    Creatinine, Ser 1.74 (*)    Alkaline Phosphatase 155 (*)    GFR, Estimated 29 (*)    All other components within normal limits  LIPASE, BLOOD - Abnormal; Notable for the following components:   Lipase 75 (*)    All other components within normal limits  CBC WITH  DIFFERENTIAL/PLATELET - Abnormal; Notable for the following components:   RBC 3.48 (*)    Hemoglobin 9.1 (*)    HCT 27.9 (*)    RDW 19.9 (*)    Platelets 115 (*)    Lymphs Abs 0.6 (*)    Monocytes Absolute 0.0 (*)    All other components within normal limits  POC OCCULT BLOOD, ED - Abnormal; Notable for the following components:   Fecal Occult Bld POSITIVE (*)    All other components within normal limits  PROTIME-INR  URINALYSIS, ROUTINE W REFLEX MICROSCOPIC  TYPE AND SCREEN    EKG None  Radiology No results found.  Procedures Procedures   Medications Ordered in ED Medications - No data to display  ED Course  I have reviewed the triage vital signs and the nursing notes.  Pertinent labs & imaging results that were available during my care of the patient were reviewed by me and considered in my medical decision making (see chart for details).    MDM Rules/Calculators/A&P                          Patient presents as outlined.  She has been having bloody bowel movements since yesterday evening.  She did start today with a hemoglobin of 6.2 and was transfused at the transfusion center.  The transfusion center she went on to have 2 more episodes of bloody bowel movement including clots.  At that point she was referred to the emergency department for further evaluation and observation.  Vital signs and mental status are clear.  Hemoglobin is at 9.1.  Patient does not need additional transfusion.  Will admit for observation overnight for recurrent GI bleeding with known chronic anemia, multifactorial requiring recurrent blood transfusions. Final Clinical Impression(s) / ED  Diagnoses Final diagnoses:  Lower GI bleed  Severe comorbid illness    Rx / DC Orders ED Discharge Orders     None        Charlesetta Shanks, MD 03/12/21 2220

## 2021-03-13 ENCOUNTER — Observation Stay (HOSPITAL_COMMUNITY): Payer: Medicare Other

## 2021-03-13 ENCOUNTER — Encounter (HOSPITAL_COMMUNITY): Payer: Self-pay | Admitting: Internal Medicine

## 2021-03-13 DIAGNOSIS — E785 Hyperlipidemia, unspecified: Secondary | ICD-10-CM | POA: Diagnosis present

## 2021-03-13 DIAGNOSIS — J309 Allergic rhinitis, unspecified: Secondary | ICD-10-CM | POA: Diagnosis present

## 2021-03-13 DIAGNOSIS — E039 Hypothyroidism, unspecified: Secondary | ICD-10-CM | POA: Diagnosis present

## 2021-03-13 DIAGNOSIS — D649 Anemia, unspecified: Secondary | ICD-10-CM | POA: Diagnosis present

## 2021-03-13 DIAGNOSIS — D469 Myelodysplastic syndrome, unspecified: Secondary | ICD-10-CM | POA: Diagnosis present

## 2021-03-13 DIAGNOSIS — D62 Acute posthemorrhagic anemia: Secondary | ICD-10-CM | POA: Diagnosis present

## 2021-03-13 DIAGNOSIS — Z8616 Personal history of COVID-19: Secondary | ICD-10-CM | POA: Diagnosis not present

## 2021-03-13 DIAGNOSIS — D631 Anemia in chronic kidney disease: Secondary | ICD-10-CM | POA: Diagnosis present

## 2021-03-13 DIAGNOSIS — Z8052 Family history of malignant neoplasm of bladder: Secondary | ICD-10-CM | POA: Diagnosis not present

## 2021-03-13 DIAGNOSIS — K2950 Unspecified chronic gastritis without bleeding: Secondary | ICD-10-CM | POA: Diagnosis present

## 2021-03-13 DIAGNOSIS — K922 Gastrointestinal hemorrhage, unspecified: Secondary | ICD-10-CM | POA: Diagnosis not present

## 2021-03-13 DIAGNOSIS — K625 Hemorrhage of anus and rectum: Secondary | ICD-10-CM | POA: Diagnosis present

## 2021-03-13 DIAGNOSIS — N1832 Chronic kidney disease, stage 3b: Secondary | ICD-10-CM | POA: Diagnosis present

## 2021-03-13 DIAGNOSIS — Z20822 Contact with and (suspected) exposure to covid-19: Secondary | ICD-10-CM | POA: Diagnosis present

## 2021-03-13 DIAGNOSIS — I1310 Hypertensive heart and chronic kidney disease without heart failure, with stage 1 through stage 4 chronic kidney disease, or unspecified chronic kidney disease: Secondary | ICD-10-CM | POA: Diagnosis present

## 2021-03-13 DIAGNOSIS — K2100 Gastro-esophageal reflux disease with esophagitis, without bleeding: Secondary | ICD-10-CM | POA: Diagnosis present

## 2021-03-13 DIAGNOSIS — Z791 Long term (current) use of non-steroidal anti-inflammatories (NSAID): Secondary | ICD-10-CM | POA: Diagnosis not present

## 2021-03-13 DIAGNOSIS — Z87891 Personal history of nicotine dependence: Secondary | ICD-10-CM | POA: Diagnosis not present

## 2021-03-13 DIAGNOSIS — Z8249 Family history of ischemic heart disease and other diseases of the circulatory system: Secondary | ICD-10-CM | POA: Diagnosis not present

## 2021-03-13 DIAGNOSIS — F0390 Unspecified dementia without behavioral disturbance: Secondary | ICD-10-CM | POA: Diagnosis present

## 2021-03-13 DIAGNOSIS — K648 Other hemorrhoids: Secondary | ICD-10-CM | POA: Diagnosis present

## 2021-03-13 DIAGNOSIS — K5731 Diverticulosis of large intestine without perforation or abscess with bleeding: Secondary | ICD-10-CM | POA: Diagnosis present

## 2021-03-13 DIAGNOSIS — Z823 Family history of stroke: Secondary | ICD-10-CM | POA: Diagnosis not present

## 2021-03-13 HISTORY — PX: IR ANGIOGRAM VISCERAL SELECTIVE: IMG657

## 2021-03-13 HISTORY — PX: IR ANGIOGRAM FOLLOW UP STUDY: IMG697

## 2021-03-13 HISTORY — PX: IR EMBO ART  VEN HEMORR LYMPH EXTRAV  INC GUIDE ROADMAPPING: IMG5450

## 2021-03-13 HISTORY — PX: IR US GUIDE VASC ACCESS RIGHT: IMG2390

## 2021-03-13 LAB — TYPE AND SCREEN
ABO/RH(D): O POS
Antibody Screen: POSITIVE
DAT, IgG: NEGATIVE
Donor AG Type: NEGATIVE
Donor AG Type: NEGATIVE
PT AG Type: NEGATIVE
Unit division: 0
Unit division: 0

## 2021-03-13 LAB — COMPREHENSIVE METABOLIC PANEL
ALT: 12 U/L (ref 0–44)
AST: 15 U/L (ref 15–41)
Albumin: 3.6 g/dL (ref 3.5–5.0)
Alkaline Phosphatase: 139 U/L — ABNORMAL HIGH (ref 38–126)
Anion gap: 8 (ref 5–15)
BUN: 43 mg/dL — ABNORMAL HIGH (ref 8–23)
CO2: 22 mmol/L (ref 22–32)
Calcium: 9.2 mg/dL (ref 8.9–10.3)
Chloride: 112 mmol/L — ABNORMAL HIGH (ref 98–111)
Creatinine, Ser: 1.58 mg/dL — ABNORMAL HIGH (ref 0.44–1.00)
GFR, Estimated: 33 mL/min — ABNORMAL LOW (ref >60)
Glucose, Bld: 135 mg/dL — ABNORMAL HIGH (ref 70–99)
Potassium: 4.6 mmol/L (ref 3.5–5.1)
Sodium: 142 mmol/L (ref 135–145)
Total Bilirubin: 0.4 mg/dL (ref 0.3–1.2)
Total Protein: 6.9 g/dL (ref 6.5–8.1)

## 2021-03-13 LAB — URINALYSIS, ROUTINE W REFLEX MICROSCOPIC
Bilirubin Urine: NEGATIVE
Glucose, UA: NEGATIVE mg/dL
Hgb urine dipstick: NEGATIVE
Ketones, ur: NEGATIVE mg/dL
Leukocytes,Ua: NEGATIVE
Nitrite: NEGATIVE
Protein, ur: NEGATIVE mg/dL
Specific Gravity, Urine: 1.018 (ref 1.005–1.030)
pH: 5 (ref 5.0–8.0)

## 2021-03-13 LAB — BPAM RBC
Blood Product Expiration Date: 202207122359
Blood Product Expiration Date: 202207122359
ISSUE DATE / TIME: 202206101416
ISSUE DATE / TIME: 202206101416
Unit Type and Rh: 5100
Unit Type and Rh: 5100

## 2021-03-13 LAB — MAGNESIUM
Magnesium: 2.3 mg/dL (ref 1.7–2.4)
Magnesium: 2.3 mg/dL (ref 1.7–2.4)

## 2021-03-13 LAB — CBC
HCT: 25.5 % — ABNORMAL LOW (ref 36.0–46.0)
HCT: 24.9 % — ABNORMAL LOW (ref 36.0–46.0)
Hemoglobin: 8.3 g/dL — ABNORMAL LOW (ref 12.0–15.0)
Hemoglobin: 8 g/dL — ABNORMAL LOW (ref 12.0–15.0)
MCH: 25.9 pg — ABNORMAL LOW (ref 26.0–34.0)
MCH: 26.4 pg (ref 26.0–34.0)
MCHC: 32.5 g/dL (ref 30.0–36.0)
MCHC: 32.1 g/dL (ref 30.0–36.0)
MCV: 79.4 fL — ABNORMAL LOW (ref 80.0–100.0)
MCV: 82.2 fL (ref 80.0–100.0)
Platelets: 101 10*3/uL — ABNORMAL LOW (ref 150–400)
Platelets: 115 10*3/uL — ABNORMAL LOW (ref 150–400)
RBC: 3.21 MIL/uL — ABNORMAL LOW (ref 3.87–5.11)
RBC: 3.03 MIL/uL — ABNORMAL LOW (ref 3.87–5.11)
RDW: 20 % — ABNORMAL HIGH (ref 11.5–15.5)
RDW: 20.7 % — ABNORMAL HIGH (ref 11.5–15.5)
WBC: 5.3 10*3/uL (ref 4.0–10.5)
WBC: 8.1 10*3/uL (ref 4.0–10.5)
nRBC: 0 % (ref 0.0–0.2)
nRBC: 0 % (ref 0.0–0.2)

## 2021-03-13 LAB — PROTIME-INR
INR: 1.2 (ref 0.8–1.2)
Prothrombin Time: 14.8 seconds (ref 11.4–15.2)

## 2021-03-13 LAB — SARS CORONAVIRUS 2 (TAT 6-24 HRS): SARS Coronavirus 2: NEGATIVE

## 2021-03-13 LAB — HEMOGLOBIN AND HEMATOCRIT, BLOOD
HCT: 27.5 % — ABNORMAL LOW (ref 36.0–46.0)
Hemoglobin: 9 g/dL — ABNORMAL LOW (ref 12.0–15.0)

## 2021-03-13 MED ORDER — LEVOTHYROXINE SODIUM 75 MCG PO TABS
75.0000 ug | ORAL_TABLET | Freq: Every day | ORAL | Status: DC
  Administered 2021-03-14 – 2021-03-16 (×3): 75 ug via ORAL
  Filled 2021-03-13 (×3): qty 1

## 2021-03-13 MED ORDER — ALLOPURINOL 300 MG PO TABS
300.0000 mg | ORAL_TABLET | Freq: Every day | ORAL | Status: DC
  Administered 2021-03-13 – 2021-03-16 (×4): 300 mg via ORAL
  Filled 2021-03-13 (×5): qty 1

## 2021-03-13 MED ORDER — MIDAZOLAM HCL 2 MG/2ML IJ SOLN
INTRAMUSCULAR | Status: AC
  Filled 2021-03-13: qty 2

## 2021-03-13 MED ORDER — FENTANYL CITRATE (PF) 100 MCG/2ML IJ SOLN
INTRAMUSCULAR | Status: AC
  Filled 2021-03-13: qty 2

## 2021-03-13 MED ORDER — SODIUM CHLORIDE (PF) 0.9 % IJ SOLN
INTRAMUSCULAR | Status: AC
  Filled 2021-03-13: qty 50

## 2021-03-13 MED ORDER — DONEPEZIL HCL 10 MG PO TABS
10.0000 mg | ORAL_TABLET | Freq: Every day | ORAL | Status: DC
  Administered 2021-03-13 – 2021-03-15 (×3): 10 mg via ORAL
  Filled 2021-03-13 (×4): qty 1

## 2021-03-13 MED ORDER — PANTOPRAZOLE SODIUM 40 MG IV SOLR
40.0000 mg | Freq: Two times a day (BID) | INTRAVENOUS | Status: DC
  Administered 2021-03-13 (×3): 40 mg via INTRAVENOUS
  Filled 2021-03-13 (×3): qty 40

## 2021-03-13 MED ORDER — FLUTICASONE PROPIONATE 50 MCG/ACT NA SUSP
1.0000 | Freq: Every day | NASAL | Status: DC
  Administered 2021-03-16: 1 via NASAL
  Filled 2021-03-13: qty 16

## 2021-03-13 MED ORDER — IOHEXOL 350 MG/ML SOLN
100.0000 mL | Freq: Once | INTRAVENOUS | Status: AC | PRN
  Administered 2021-03-13: 80 mL via INTRAVENOUS

## 2021-03-13 MED ORDER — SODIUM CHLORIDE 0.9 % IV SOLN: INTRAVENOUS | Status: DC

## 2021-03-13 MED ORDER — IOHEXOL 300 MG/ML  SOLN
50.0000 mL | Freq: Once | INTRAMUSCULAR | Status: AC | PRN
  Administered 2021-03-13: 40 mL via INTRA_ARTERIAL

## 2021-03-13 MED ORDER — IOHEXOL 300 MG/ML  SOLN
50.0000 mL | Freq: Once | INTRAMUSCULAR | Status: AC | PRN
  Administered 2021-03-13: 15 mL via INTRA_ARTERIAL

## 2021-03-13 MED ORDER — MEMANTINE HCL 10 MG PO TABS
10.0000 mg | ORAL_TABLET | Freq: Every day | ORAL | Status: DC
  Administered 2021-03-13 – 2021-03-16 (×4): 10 mg via ORAL
  Filled 2021-03-13 (×4): qty 1

## 2021-03-13 MED ORDER — LIDOCAINE HCL 1 % IJ SOLN
INTRAMUSCULAR | Status: AC
  Filled 2021-03-13: qty 20

## 2021-03-13 MED ORDER — MIDAZOLAM HCL 2 MG/2ML IJ SOLN
INTRAMUSCULAR | Status: AC | PRN
  Administered 2021-03-13: 1 mg via INTRAVENOUS
  Administered 2021-03-13: 0.5 mg via INTRAVENOUS

## 2021-03-13 MED ORDER — IOHEXOL 300 MG/ML  SOLN
50.0000 mL | Freq: Once | INTRAMUSCULAR | Status: AC | PRN
  Administered 2021-03-13: 10 mL via INTRA_ARTERIAL

## 2021-03-13 MED ORDER — FENTANYL CITRATE (PF) 100 MCG/2ML IJ SOLN
INTRAMUSCULAR | Status: AC | PRN
  Administered 2021-03-13: 25 ug via INTRAVENOUS

## 2021-03-13 NOTE — Progress Notes (Addendum)
PROGRESS NOTE    Susan [name]  IEP:[id number] DOB: 1941/03/30 DOA: 03/12/2021 PCP: Sonia Side., [name]  Brief Narrative: 80 year old female with history of chronic anemia, MDS, CKD 3 with baseline creatinine of 1.5-1.8 presented to the ED after multiple episodes of bright red blood per rectum x 2 days. -Early in the day of admission she saw Dr. Alen Blew her hematologist for routine follow-up, that day she had an episode of hematochezia, in the office she was noted to have a hemoglobin of 6.2 and subsequently transfused 2 units of PRBC at the cancer center, while at the cancer center she had 2 more episodes of hematochezia and subsequently sent to the ED. she denies any abdominal pain, denies fevers or chills -In the ED her hemoglobin was 9.1, repeat at 8.3,, creatinine was 1.5  Assessment & Plan:   Presumed diverticular bleeding Acute blood loss anemia -Multiple episodes overnight and this morning -Urgent CTA abdomen pelvis today, IV fluids for renal protection -Prior colonoscopy in 01/2017 with pandiverticulosis and internal hemorrhoids -Monitor hemoglobin closely -Transfused 2 units of PRBC yesterday -Admit to stepdown unit  CKD 3a -baseline creatinine 1.5-1.8, creatinine now 1.5, unfortunately needs CTA due to Acute severe lower GI bleeding, will hydrate today, monitor urine output and kidney function closely  Mild Dementia -continue aricept  GERD -Continue PPI  Essential hypertension -Hold amlodipine and labetalol  Hypothyroidism -Continue Synthroid  H/o MDS Chronic anemia -followed by Dr.Shadad  DVT prophylaxis:SCDs Code Status: Full Code Family Communication:none at bedside, will update family Disposition Plan:  Status is: Observation  The patient will require care spanning > 2 midnights and should be moved to inpatient because: Inpatient level of care appropriate due to severity of illness  Dispo: The patient is from: Home              Anticipated d/c is to:  Home              Patient currently is not medically stable to d/c.   Difficult to place patient No        Consultants:  [place] [facility] [facility]  Procedures:   Antimicrobials:    Subjective: 2 episodes of bright red blood per rectum this morning  Objective: Vitals:   03/13/21 0822 03/13/21 0830 03/13/21 0845 03/13/21 0930  BP: (!) 138/97 (!) 141/68 123/74 132/86  Pulse: 65 63  71  Resp: 16     Temp:      TempSrc:      SpO2: 100% 100%  100%   No intake or output data in the 24 hours ending 03/13/21 1028 There were no vitals filed for this visit.  Examination:  General exam: Pleasant elderly female, laying in bed, anxious, AAOx3, no distress HEENT: No JVD CVS S1-S2, regular rate rhythm Lungs: Clear bilaterally Abdomen: Soft, nontender, nondistended, bowel sounds present Extremities: No edema Skin: No rashes on exposed skin Psych: Appropriate mood and affect   Data Reviewed:   CBC: Recent Labs  Lab 03/12/21 1032 03/12/21 2016 03/13/21 0121 03/13/21 0500  WBC 7.6 7.9  --  5.3  NEUTROABS 5.8 7.2  --   --   HGB 6.2* 9.1* 9.0* 8.3*  HCT 19.5* 27.9* 27.5* 25.5*  MCV 75.9* 80.2  --  79.4*  PLT 127* 115*  --  660*   Basic Metabolic Panel: Recent Labs  Lab 03/12/21 1032 03/12/21 2016 03/13/21 0500  NA 144 140 142  K 3.8 4.6 4.6  CL 111 109 112*  CO2 24 21* 22  GLUCOSE 100* 185* 135*  BUN 41* 44* 43*  CREATININE 1.93* 1.74* 1.58*  CALCIUM 9.3 9.2 9.2  MG  --  2.3 2.3   GFR: Estimated Creatinine Clearance: 24.6 mL/min (A) (by C-G formula based on SCr of 1.58 mg/dL (H)). Liver Function Tests: Recent Labs  Lab 03/12/21 1032 03/12/21 2016 03/13/21 0500  AST 15 20 15   ALT 11 15 12   ALKPHOS 171* 155* 139*  BILITOT 0.3 0.6 0.4  PROT 7.2 7.8 6.9  ALBUMIN 3.5 4.0 3.6   Recent Labs  Lab 03/12/21 2016  LIPASE 75*   No results for input(s): AMMONIA in the last 168 hours. Coagulation Profile: Recent Labs  Lab 03/12/21 2016 03/13/21 0500   INR 1.2 1.2   Cardiac Enzymes: No results for input(s): CKTOTAL, CKMB, CKMBINDEX, TROPONINI in the last 168 hours. BNP (last 3 results) No results for input(s): PROBNP in the last 8760 hours. HbA1C: No results for input(s): HGBA1C in the last 72 hours. CBG: No results for input(s): GLUCAP in the last 168 hours. Lipid Profile: No results for input(s): CHOL, HDL, LDLCALC, TRIG, CHOLHDL, LDLDIRECT in the last 72 hours. Thyroid Function Tests: No results for input(s): TSH, T4TOTAL, FREET4, T3FREE, THYROIDAB in the last 72 hours. Anemia Panel: Recent Labs    03/12/21 1032  FERRITIN 19  TIBC 306  IRON 45   Urine analysis:    Component Value Date/Time   COLORURINE YELLOW 03/13/2021 0429   APPEARANCEUR CLEAR 03/13/2021 0429   LABSPEC 1.018 03/13/2021 0429   PHURINE 5.0 03/13/2021 0429   GLUCOSEU NEGATIVE 03/13/2021 0429   HGBUR NEGATIVE 03/13/2021 0429   BILIRUBINUR NEGATIVE 03/13/2021 0429   KETONESUR NEGATIVE 03/13/2021 0429   PROTEINUR NEGATIVE 03/13/2021 0429   UROBILINOGEN 1.0 07/07/2015 1917   NITRITE NEGATIVE 03/13/2021 0429   LEUKOCYTESUR NEGATIVE 03/13/2021 0429   Sepsis Labs: @LABRCNTIP (procalcitonin:4,lacticidven:4)  ) Recent Results (from the past 240 hour(s))  SARS CORONAVIRUS 2 (TAT 6-24 HRS) Nasopharyngeal Nasopharyngeal Swab     Status: None   Collection Time: 03/12/21 10:47 PM   Specimen: Nasopharyngeal Swab  Result Value Ref Range Status   SARS Coronavirus 2 NEGATIVE NEGATIVE Final    Comment: (NOTE) SARS-CoV-2 target nucleic acids are NOT DETECTED.  The SARS-CoV-2 RNA is generally detectable in upper and lower respiratory specimens during the acute phase of infection. Negative results do not preclude SARS-CoV-2 infection, do not rule out co-infections with other pathogens, and should not be used as the sole basis for treatment or other patient management decisions. Negative results must be combined with clinical observations, patient history, and  epidemiological information. The expected result is Negative.  Fact Sheet for Patients: SugarRoll.be  Fact Sheet for Healthcare Providers: https://www.woods-mathews.com/  This test is not yet approved or cleared by the [place] FDA and  has been authorized for detection and/or diagnosis of SARS-CoV-2 by FDA under an Emergency Use Authorization (EUA). This EUA will remain  in effect (meaning this test can be used) for the duration of the COVID-19 declaration under Se ction 564(b)(1) of the Act, 21 U.S.C. section 360bbb-3(b)(1), unless the authorization is terminated or revoked sooner.  Performed at [facility], [place] [address] [place], [place] [address]     Scheduled Meds:  fluticasone  1 spray Each Nare Daily   pantoprazole (PROTONIX) IV  40 mg Intravenous Q12H   sodium chloride (PF)       Continuous Infusions:  sodium chloride       LOS: 0 days  Time spent: 76min  Domenic Polite, MD [facility]   03/13/2021, 10:28 AM

## 2021-03-13 NOTE — Consult Note (Signed)
Referring Provider:   [facility], Dr. Broadus John        Primary Care Physician:  Sonia Side., [name] Primary Gastroenterologist: Dr. Silverio Decamp           Reason for Consultation: Hematochezia                 ASSESSMENT /  PLAN   80 y.o. female with a past medical history of multifactorial anemia related to CKD and MDS, history of GERD, diverticulosis, symptomatic hemorrhoids and rectal prolapse, hypertension, hypothyroidism and arthritis who was admitted after hematology follow-up visit where she reported hematochezia.  Hematochezia/lower GI bleeding -- presumed acute colonic diverticular hemorrhage.  Known extensive diverticulosis throughout the colon at last colonoscopy.  Still with episodes of bleeding this morning --STAT CT-angio abd/pelvis in attempt to localize bleeding, if + IR consult.  Discussed with Dr. Broadus John who will hydrate today for renal protection.  Crt is 1.5 (baseline 1.7) --Hgb did respond to 2u pRBC yesterday, was 9 now 8.3.  Hgb every 6 hours, transfer to maintain > 7.0 --Hold NSAIDs --Given extensive nature of diverticulosis emergent colonoscopy is low yield to localize bleeding.   --We will follow  2. Recent mild esophagitis and gastritis -- seen at EGD in April 2022.  Daily PPI sufficient.  This presentation is more c/w LGI bleeding.   HPI:     Susan [name] is a 80 y.o. female with a past medical history of multifactorial anemia related to CKD and MDS, history of GERD, diverticulosis, symptomatic hemorrhoids and rectal prolapse, hypertension, hypothyroidism and arthritis who was admitted after hematology follow-up visit where she reported hematochezia.  Patient with history of multifactorial anemia including MDS in the setting of chronic kidney disease.  She has been seeing Dr. Alen Blew and intermittently receiving packed red cell transfusions and growth factor (Aranesp).  Patient had a colonoscopy on 02/10/2017 with Dr. Silverio Decamp.  This revealed  pancolonic diverticulosis and internal hemorrhoids. Patient also had an upper endoscopy performed by Dr. Ardis Hughs on 01/09/2021 to evaluate chronic anemia but also darker stools at that time.  EGD revealed mild esophagitis and gastritis.  Gastric biopsy showed mild chronic gastritis without H. pylori.  Hematochezia, painless with the exception of lower abd bloating sensation before each episode, started Thursday night at home.  Initially red blood seen with wiping but progressed to maroon stools with clots throughout the night.  Was seen in hematology and received 2 u pRBC.  Here and this am she has had 2-3 further episodes of hematochezia.  Currently no abd pain, n, vomiting.  No chest pain or dyspnea.  Of note she was prescribed and started meloxicam 7.5 mg BID about 1 week ago for arthritis.  She wonders if this contributed to her bleeding.   Past Medical History:  Diagnosis Date   Anemia    Arthritis    Blood transfusion without reported diagnosis    GERD (gastroesophageal reflux disease)    HLD (hyperlipidemia)    Hypertension    Hypothyroidism    Memory loss    Myelodysplasia    Vitamin D deficiency     Past Surgical History:  Procedure Laterality Date   BIOPSY  01/09/2021   Procedure: BIOPSY;  Surgeon: Milus Banister, MD;  Location: [facility];  Service: Endoscopy;;   CARPAL TUNNEL RELEASE Right    DILATION AND CURETTAGE OF UTERUS     ESOPHAGOGASTRODUODENOSCOPY (EGD) WITH PROPOFOL N/A 01/09/2021   Procedure: ESOPHAGOGASTRODUODENOSCOPY (EGD) WITH PROPOFOL;  Surgeon: Ardis Hughs,  Melene Plan, MD;  Location: [facility];  Service: Endoscopy;  Laterality: N/A;   INGUINAL HERNIA REPAIR     ROTATOR CUFF REPAIR Left    TRANSANAL HEMORRHOIDAL DEARTERIALIZATION N/A 03/31/2017   Procedure: TRANSANAL HEMORRHOIDAL DEARTERIALIZATION;  Surgeon: Leighton Ruff, MD;  Location: [facility];  Service: General;  Laterality: N/A;   UMBILICAL HERNIA REPAIR      Prior to Admission medications   Medication Sig  Start Date End Date Taking? Authorizing Provider  acetaminophen (TYLENOL) 500 MG tablet Take 500-1,000 mg by mouth every 6 (six) hours as needed (for pain.).    [provider]  allopurinol (ZYLOPRIM) 300 MG tablet Take 300 mg by mouth daily. 07/20/20   [provider]  amLODipine (NORVASC) 10 MG tablet TAKE 1/2 TABLET BY MOUTH DAILY AT BEDTIME; (TAKE 1 TABLET IF SBP IS >150) Patient taking differently: Take 10 mg by mouth at bedtime. (TAKE 1 TABLET IF SBP IS >150) 07/20/17   [name], NP  Cholecalciferol (VITAMIN D-3) 5000 UNITS TABS Take 15,000 Units by mouth daily.     [provider]  Darbepoetin Alfa 300 MCG/ML SOLN Inject 300 mcg into the skin See admin instructions. USE INJECTION EVERY 3 WEEKS AS NEEDED FOR HEMOGLOBIN LESS THAN 11.    [provider]  diphenhydrAMINE (BENADRYL) 25 mg capsule One capsule every 6 hours x 3 days, then as needed for symptoms of rash/itching 03/25/17   Charlann Lange, PA-C  donepezil (ARICEPT) 10 MG tablet Take 10 mg by mouth daily. 07/20/20   [provider]  fluticasone (FLONASE) 50 MCG/ACT nasal spray Place 1 spray into both nostrils in the morning and at bedtime. 12/17/20   [provider]  hydroxypropyl methylcellulose / hypromellose (ISOPTO TEARS / GONIOVISC) 2.5 % ophthalmic solution Place 1-2 drops into both eyes 3 (three) times daily as needed for dry eyes.    [provider]  labetalol (NORMODYNE) 200 MG tablet Take 200 mg by mouth 2 (two) times daily. 06/23/20   [provider]  levothyroxine (SYNTHROID) 75 MCG tablet Take 75 mcg by mouth every morning. 12/18/20   [provider]  memantine (NAMENDA) 10 MG tablet Take 1 tablet (10 mg total) by mouth 2 (two) times daily. 11/30/20   [name], NP  mirtazapine (REMERON) 15 MG tablet Take 15 mg by mouth at bedtime. 07/20/20   [provider]  montelukast (SINGULAIR) 10 MG tablet SMARTSIG:1 Tablet(s) By Mouth Every  Evening 12/14/20   [provider]  pantoprazole (PROTONIX) 40 MG tablet Take 1 tablet (40 mg total) by mouth 2 (two) times daily before a meal. 01/10/21   Little Ishikawa, MD  vitamin E 200 UNIT capsule Take 200 Units by mouth daily.    [provider]    Current Facility-Administered Medications  Medication Dose Route Frequency Provider Last Rate Last Admin   acetaminophen (TYLENOL) tablet 650 mg  650 mg Oral Q6H PRN Howerter, Justin B, DO       Or   acetaminophen (TYLENOL) suppository 650 mg  650 mg Rectal Q6H PRN Howerter, Justin B, DO       fluticasone (FLONASE) 50 MCG/ACT nasal spray 1 spray  1 spray Each Nare Daily Howerter, Justin B, DO       pantoprazole (PROTONIX) injection 40 mg  40 mg Intravenous Q12H Howerter, Justin B, DO   40 mg at 03/13/21 0126   Current Outpatient Medications  Medication Sig Dispense Refill   acetaminophen (TYLENOL) 500 MG tablet Take  500-1,000 mg by mouth every 6 (six) hours as needed (for pain.).     allopurinol (ZYLOPRIM) 300 MG tablet Take 300 mg by mouth daily.     amLODipine (NORVASC) 10 MG tablet TAKE 1/2 TABLET BY MOUTH DAILY AT BEDTIME; (TAKE 1 TABLET IF SBP IS >150) (Patient taking differently: Take 10 mg by mouth at bedtime. (TAKE 1 TABLET IF SBP IS >150)) 90 tablet 0   Cholecalciferol (VITAMIN D-3) 5000 UNITS TABS Take 15,000 Units by mouth daily.      Darbepoetin Alfa 300 MCG/ML SOLN Inject 300 mcg into the skin See admin instructions. USE INJECTION EVERY 3 WEEKS AS NEEDED FOR HEMOGLOBIN LESS THAN 11.     diphenhydrAMINE (BENADRYL) 25 mg capsule One capsule every 6 hours x 3 days, then as needed for symptoms of rash/itching 30 capsule 0   donepezil (ARICEPT) 10 MG tablet Take 10 mg by mouth daily.     fluticasone (FLONASE) 50 MCG/ACT nasal spray Place 1 spray into both nostrils in the morning and at bedtime.     hydroxypropyl methylcellulose / hypromellose (ISOPTO TEARS / GONIOVISC) 2.5 % ophthalmic solution Place 1-2 drops  into both eyes 3 (three) times daily as needed for dry eyes.     labetalol (NORMODYNE) 200 MG tablet Take 200 mg by mouth 2 (two) times daily.     levothyroxine (SYNTHROID) 75 MCG tablet Take 75 mcg by mouth every morning.     memantine (NAMENDA) 10 MG tablet Take 1 tablet (10 mg total) by mouth 2 (two) times daily. 60 tablet 11   mirtazapine (REMERON) 15 MG tablet Take 15 mg by mouth at bedtime.     montelukast (SINGULAIR) 10 MG tablet SMARTSIG:1 Tablet(s) By Mouth Every Evening     pantoprazole (PROTONIX) 40 MG tablet Take 1 tablet (40 mg total) by mouth 2 (two) times daily before a meal. 60 tablet 0   vitamin E 200 UNIT capsule Take 200 Units by mouth daily.      Allergies as of 03/12/2021 - Review Complete 03/12/2021  Allergen Reaction Noted   Codeine Anaphylaxis 12/28/2011   Penicillins Anaphylaxis 12/28/2011   Bactrim Itching and Swelling 12/28/2011   Clarithromycin Other (See Comments) 12/28/2011   Flagyl [metronidazole hcl] Itching and Swelling 12/28/2011   Food Other (See Comments) 03/24/2017   Pineapple Itching and Swelling 03/24/2017   Sulfa antibiotics Itching and Swelling 12/28/2011    Family History  Problem Relation Age of Onset   Stroke Father    Hypertension Father    Other Mother        died at early age   Stroke Sister    Coronary artery disease Brother    Hypertension Sister    Bladder Cancer Sister    Colon cancer Neg Hx    Stomach cancer Neg Hx     Social History   Tobacco Use   Smoking status: Former    Pack years: 0.00    Types: Cigarettes    Quit date: 10/02/1996    Years since quitting: 24.4   Smokeless tobacco: Never  Vaping Use   Vaping Use: Never used  Substance Use Topics   Alcohol use: Yes    Comment: glass of wine at least once a month.   Drug use: No    Review of Systems: All systems reviewed and negative except where noted in HPI.  Physical Exam: Vital signs in last 24 hours: Temp:  [97.7 F (36.5 C)] 97.7 F (36.5 C)  (06/10 1904) Pulse Rate:  [  62-72] 65 (06/11 0822) Resp:  [13-18] 16 (06/11 0822) BP: (106-148)/(66-97) 138/97 (06/11 0822) SpO2:  [98 %-100 %] 100 % (06/11 0822)   General:   Awake, alert, NAD Psych:  Pleasant, cooperative. Normal mood and affect. Eyes:  Pupils equal, sclera clear, no icterus.    Lungs:  Clear throughout to auscultation.   No wheezes, crackles, or rhonchi.  Heart:  Regular rhythm and rate; soft sem, no lower extremity edema Abdomen:  Soft, non-distended, nontender, BS active, no palp mass   Rectal:  Deferred  Msk:  Symmetrical without gross deformities. . Neurologic:  Alert and  oriented x4;  grossly normal neurologically. Skin:  Intact without significant lesions or rashes.   Intake/Output from previous day: No intake/output data recorded. Intake/Output this shift: No intake/output data recorded.  Lab Results: Recent Labs    03/12/21 1032 03/12/21 2016 03/13/21 0121 03/13/21 0500  WBC 7.6 7.9  --  5.3  HGB 6.2* 9.1* 9.0* 8.3*  HCT 19.5* 27.9* 27.5* 25.5*  PLT 127* 115*  --  101*   BMET Recent Labs    03/12/21 1032 03/12/21 2016 03/13/21 0500  NA 144 140 142  K 3.8 4.6 4.6  CL 111 109 112*  CO2 24 21* 22  GLUCOSE 100* 185* 135*  BUN 41* 44* 43*  CREATININE 1.93* 1.74* 1.58*  CALCIUM 9.3 9.2 9.2   LFT Recent Labs    03/13/21 0500  PROT 6.9  ALBUMIN 3.6  AST 15  ALT 12  ALKPHOS 139*  BILITOT 0.4   PT/INR Recent Labs    03/12/21 2016 03/13/21 0500  LABPROT 14.7 14.8  INR 1.2 1.2   Hepatitis Panel No results for input(s): HEPBSAG, HCVAB, HEPAIGM, HEPBIGM in the last 72 hours.   . CBC Latest Ref Rng & Units 03/13/2021 03/13/2021 03/12/2021  WBC 4.0 - 10.5 K/uL 5.3 - 7.9  Hemoglobin 12.0 - 15.0 g/dL 8.3(L) 9.0(L) 9.1(L)  Hematocrit 36.0 - 46.0 % 25.5(L) 27.5(L) 27.9(L)  Platelets 150 - 400 K/uL 101(L) - 115(L)    . CMP Latest Ref Rng & Units 03/13/2021 03/12/2021 03/12/2021  Glucose 70 - 99 mg/dL 135(H) 185(H) 100(H)  BUN 8 - 23  mg/dL 43(H) 44(H) 41(H)  Creatinine 0.44 - 1.00 mg/dL 1.58(H) 1.74(H) 1.93(H)  Sodium 135 - 145 mmol/L 142 140 144  Potassium 3.5 - 5.1 mmol/L 4.6 4.6 3.8  Chloride 98 - 111 mmol/L 112(H) 109 111  CO2 22 - 32 mmol/L 22 21(L) 24  Calcium 8.9 - 10.3 mg/dL 9.2 9.2 9.3  Total Protein 6.5 - 8.1 g/dL 6.9 7.8 7.2  Total Bilirubin 0.3 - 1.2 mg/dL 0.4 0.6 0.3  Alkaline Phos 38 - 126 U/L 139(H) 155(H) 171(H)  AST 15 - 41 U/L 15 20 15   ALT 0 - 44 U/L 12 15 11    Studies/Results: No results found.  Principal Problem:   Acute lower GI bleeding Active Problems:   Essential hypertension   Hypothyroidism   GERD (gastroesophageal reflux disease)   CKD, 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)   Acute on chronic anemia   Allergic rhinitis    Joyice Magda M. Luay Balding, M.D. @  03/13/2021, 8:50 AM

## 2021-03-13 NOTE — ED Notes (Signed)
Patient had 2 bright red blood stool this morning. Susan [name], M.D at bedside and aware.

## 2021-03-13 NOTE — Progress Notes (Signed)
CT-A abd/pelvis done and I reviewed the images with Dr. Geroge Baseman with IR. Study is + for active bleeding from diverticulum in the right colon. IR will pursue selective angiography for active diverticular hemorrhage. I appreciate their help GI will follow

## 2021-03-13 NOTE — Sedation Documentation (Signed)
Patient transported back to emergency dept, pending bed for admission. Madina RN at the bedside to receive report. Groin site assessed. Clean, dry and intact. No drainage noted from dressing. Soft to palpation, no hematoma noted. Distal pulses intact via palpation +1. V/s WNL

## 2021-03-13 NOTE — H&P (Signed)
Chief Complaint: GI Bleed  Referring Physician(s): Lovell Sheehan Pyrtle  Supervising Physician: Corrie Mckusick  Patient Status: [facility] - In-pt  History of Present Illness: Susan [name] is a 80 y.o. female with medical issues including chronic anemia with baseline hemoglobin 7-9, myelodysplastic syndrome, GERD, hypertension, stage IIIb chronic kidney disease with baseline creatinine 1.5-1.8.  She presented to [facility] ED on 03/12/2021 with suspected acute lower GI bleed.    She reported at least 3 episodes of hematochezia over the last 24 hours, with first such episode occurring on the evening of 03/11/2021.    She notified her outpatient hematologist, Dr. Alen Blew, who ordered CBC the morning of 6/10.   Hemoglobin was 6.2 so she received transfusion of 2 units PRBC at the outpatient transfusion center.  During the infusion she had 2 additional episodes of hematochezia.  Dr. Alen Blew instructed her to go the ED further evaluation of suspected GI bleed.     CTA showed= Positive for active diverticular bleed arising from the proximal ascending colon in the right lower quadrant.  We are asked to perform mesenteric angiography with possible embolization.  She is NPO.  Past Medical History:  Diagnosis Date   Anemia    Arthritis    Blood transfusion without reported diagnosis    GERD (gastroesophageal reflux disease)    HLD (hyperlipidemia)    Hypertension    Hypothyroidism    Memory loss    Myelodysplasia    Vitamin D deficiency     Past Surgical History:  Procedure Laterality Date   BIOPSY  01/09/2021   Procedure: BIOPSY;  Surgeon: Milus Banister, MD;  Location: [facility];  Service: Endoscopy;;   CARPAL TUNNEL RELEASE Right    DILATION AND CURETTAGE OF UTERUS     ESOPHAGOGASTRODUODENOSCOPY (EGD) WITH PROPOFOL N/A 01/09/2021   Procedure: ESOPHAGOGASTRODUODENOSCOPY (EGD) WITH PROPOFOL;  Surgeon: Milus Banister, MD;  Location: [facility];  Service:  Endoscopy;  Laterality: N/A;   INGUINAL HERNIA REPAIR     ROTATOR CUFF REPAIR Left    TRANSANAL HEMORRHOIDAL DEARTERIALIZATION N/A 03/31/2017   Procedure: TRANSANAL HEMORRHOIDAL DEARTERIALIZATION;  Surgeon: Leighton Ruff, MD;  Location: [facility];  Service: General;  Laterality: N/A;   UMBILICAL HERNIA REPAIR      Allergies: Codeine, Penicillins, Bactrim, Clarithromycin, Flagyl [metronidazole hcl], Food, Pineapple, and Sulfa antibiotics  Medications: Prior to Admission medications   Medication Sig Start Date End Date Taking? Authorizing Provider  acetaminophen (TYLENOL) 500 MG tablet Take 500-1,000 mg by mouth every 6 (six) hours as needed (for pain.).   Yes [provider]  allopurinol (ZYLOPRIM) 300 MG tablet Take 300 mg by mouth daily. 07/20/20  Yes [provider]  amLODipine (NORVASC) 10 MG tablet TAKE 1/2 TABLET BY MOUTH DAILY AT BEDTIME; (TAKE 1 TABLET IF SBP IS >150) Patient taking differently: Take 10 mg by mouth at bedtime. 07/20/17  Yes [name], NP  aspirin 81 MG chewable tablet Chew 81 mg by mouth daily.   Yes [provider]  donepezil (ARICEPT) 10 MG tablet Take 10 mg by mouth at bedtime. 07/20/20  Yes [provider]  famotidine (PEPCID) 20 MG tablet Take 20 mg by mouth 2 (two) times daily. 03/08/21  Yes [provider]  labetalol (NORMODYNE) 200 MG tablet Take 200 mg by mouth 2 (two) times daily. 06/23/20  Yes [provider]  levothyroxine (SYNTHROID) 75 MCG tablet Take 75 mcg by mouth every morning. 12/18/20  Yes [provider]  meloxicam (MOBIC) 7.5 MG tablet Take 7.5 mg by mouth 2 (two) times daily as needed for pain. 02/26/21  Yes [provider]  memantine (NAMENDA) 10 MG tablet Take 1 tablet (10 mg total) by mouth 2 (two) times daily. 11/30/20  Yes [name], NP  mirtazapine (REMERON) 15 MG tablet Take 15 mg by mouth at bedtime. 07/20/20  Yes [provider]  montelukast (SINGULAIR)  10 MG tablet Take 10 mg by mouth at bedtime.   Yes [provider]  naproxen sodium (ALEVE) 220 MG tablet Take 220 mg by mouth daily.   Yes [provider]  pantoprazole (PROTONIX) 40 MG tablet Take 1 tablet (40 mg total) by mouth 2 (two) times daily before a meal. 01/10/21  Yes Little Ishikawa, MD  diphenhydrAMINE (BENADRYL) 25 mg capsule One capsule every 6 hours x 3 days, then as needed for symptoms of rash/itching Patient not taking: Reported on 03/13/2021 03/25/17   Charlann Lange, PA-C     Family History  Problem Relation Age of Onset   Stroke Father    Hypertension Father    Other Mother        died at early age   Stroke Sister    Coronary artery disease Brother    Hypertension Sister    Bladder Cancer Sister    Colon cancer Neg Hx    Stomach cancer Neg Hx     Social History   Socioeconomic History   Marital status: Single    Spouse name: Not on file   Number of children: 1   Years of education: college   Highest education level: Bachelor's degree (e.g., BA, AB, BS)  Occupational History   Occupation: retired    Fish farm manager: RETIRED  Tobacco Use   Smoking status: Former    Pack years: 0.00    Types: Cigarettes    Quit date: 10/02/1996    Years since quitting: 24.4   Smokeless tobacco: Never  Vaping Use   Vaping Use: Never used  Substance and Sexual Activity   Alcohol use: Yes    Comment: glass of wine at least once a month.   Drug use: No   Sexual activity: Not Currently    Partners: Male  Other Topics Concern   Not on file  Social History Narrative   Lives at home alone.   Right-handed.   No daily use of caffeine.   Social Determinants of Health   Financial Resource Strain: Not on file  Food Insecurity: Not on file  Transportation Needs: Not on file  Physical Activity: Not on file  Stress: Not on file  Social Connections: Not on file     Review of Systems: A 12 point ROS discussed and pertinent positives are indicated in the  HPI above.  All other systems are negative.  Review of Systems  Vital Signs: BP 132/86   Pulse 71   Temp 97.7 F (36.5 C) (Oral)   Resp 16   SpO2 100%   Physical Exam Vitals reviewed.  Constitutional:      Appearance: Normal appearance.  HENT:     Head: Normocephalic and atraumatic.  Eyes:     Extraocular Movements: Extraocular movements intact.  Cardiovascular:     Rate and Rhythm: Normal rate and regular rhythm.  Pulmonary:     Effort: Pulmonary effort is normal. No respiratory distress.     Breath sounds: Normal breath sounds.  Abdominal:     General: There is no distension.  Palpations: Abdomen is soft.     Tenderness: There is no abdominal tenderness.  Musculoskeletal:        General: Normal range of motion.  Skin:    General: Skin is warm and dry.  Neurological:     General: No focal deficit present.     Mental Status: She is alert and oriented to person, place, and time.  Psychiatric:        Mood and Affect: Mood normal.        Behavior: Behavior normal.        Thought Content: Thought content normal.        Judgment: Judgment normal.    Imaging: CT Angio Abd/Pel w/ and/or w/o  Result Date: 03/13/2021 CLINICAL DATA:  80 year old female with suspected lower GI bleeding. 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 EXAM: CTA ABDOMEN AND PELVIS WITHOUT AND WITH CONTRAST TECHNIQUE: Multidetector CT imaging of the abdomen and pelvis was performed using the standard protocol during bolus administration of intravenous contrast. Multiplanar reconstructed images and MIPs were obtained and reviewed to evaluate the vascular anatomy. CONTRAST:  31mL OMNIPAQUE IOHEXOL 350 MG/ML SOLN COMPARISON:  CT abdomen/pelvis 01/05/2016 FINDINGS: VASCULAR Aorta: Normal caliber aorta without aneurysm, dissection, vasculitis or significant stenosis. Scattered atherosclerotic vascular calcifications. Celiac: Patent without evidence of aneurysm,  dissection, vasculitis or significant stenosis. SMA: Patent without evidence of aneurysm, dissection, vasculitis or significant stenosis. Renals: Both renal arteries are patent without evidence of aneurysm, dissection, vasculitis, fibromuscular dysplasia or significant stenosis. IMA: Patent without evidence of aneurysm, dissection, vasculitis or significant stenosis. Inflow: Patent without evidence of aneurysm, dissection, vasculitis or significant stenosis. Proximal Outflow: Bilateral common femoral and visualized portions of the superficial and profunda femoral arteries are patent without evidence of aneurysm, dissection, vasculitis or significant stenosis. Veins: No focal venous abnormality. Review of the MIP images confirms the above findings. NON-VASCULAR Lower chest: No acute abnormality.  Cardiomegaly. Hepatobiliary: No focal liver abnormality is seen. No gallstones, gallbladder wall thickening, or biliary dilatation. Pancreas: Unremarkable. No pancreatic ductal dilatation or surrounding inflammatory changes. Spleen: Normal in size without focal abnormality. Adrenals/Urinary Tract: Adrenal glands are normal in appearance. No hydronephrosis, nephrolithiasis or enhancing renal mass. Exophytic simple cysts from both kidneys. The ureters and bladder are unremarkable. Stomach/Bowel: Extensive colonic diverticulosis. Extravasation of arterial phase contrast present within a diverticulum arising from the medial aspect of the ascending colon (image 114 series 6) with evidence of blooming of the extravasated contrast on the venous phase images. Findings are consistent with acute diverticular bleed. There is extensive colonic diverticulosis most profound in the sigmoid colon. No evidence of active inflammation to suggest acute diverticulitis. No focal bowel wall thickening or evidence of obstruction. Unremarkable stomach and proximal small bowel. Normal appendix. Lymphatic: No suspicious lymphadenopathy. Reproductive:  Uterus and bilateral adnexa are unremarkable. Other: Trace free fluid in the pelvis is abnormal in a post reproductive age female. The fluid is low in attenuation. Musculoskeletal: No acute fracture or aggressive appearing lytic or blastic osseous lesion. Chronic L1 compression fracture with out significant change in height loss. Advanced multilevel degenerative disc disease. Grade 1 anterolisthesis of L5 on S1. Lower lumbar facet arthropathy again noted. IMPRESSION: 1. Positive for active diverticular bleed arising from the proximal ascending colon in the right lower quadrant. 2. Extensive colonic diverticulosis without evidence of active inflammation. 3. Small volume free fluid in the anatomic pelvis is nonspecific but abnormal for a post reproductive age female. 4. Tortuous and atherosclerotic abdominal aorta. Aortic Atherosclerosis (  ICD10-I70.0). 5. Cardiomegaly. 6. Stable chronic L1 compression fracture. 7. Multilevel degenerative disc disease with grade 1 anterolisthesis of L5 on S1. These results were discussed in person at the time of interpretation on 03/13/2021 at 11:48 am to provider JAY PYRTLE , who verbally acknowledged these results. Electronically Signed   By: Jacqulynn Cadet M.D.   On: 03/13/2021 11:48    Labs:  CBC: Recent Labs    02/05/21 0914 03/12/21 1032 03/12/21 2016 03/13/21 0121 03/13/21 0500  WBC 3.3* 7.6 7.9  --  5.3  HGB 7.9* 6.2* 9.1* 9.0* 8.3*  HCT 24.6* 19.5* 27.9* 27.5* 25.5*  PLT 95* 127* 115*  --  101*    COAGS: Recent Labs    03/12/21 2016 03/13/21 0500  INR 1.2 1.2    BMP: Recent Labs    03/20/20 0919 04/17/20 1135 05/22/20 1123 07/17/20 1033 02/05/21 0914 03/12/21 1032 03/12/21 2016 03/13/21 0500  NA 143 143 141   < > 143 144 140 142  K 4.0 4.8 4.0   < > 4.3 3.8 4.6 4.6  CL 110 114* 109   < > 109 111 109 112*  CO2 24 21* 24   < > 25 24 21* 22  GLUCOSE 73 91 97   < > 79 100* 185* 135*  BUN 27* 38* 33*   < > 42* 41* 44* 43*  CALCIUM 10.4*  10.0 10.5*   < > 9.7 9.3 9.2 9.2  CREATININE 1.31* 1.81* 1.79*   < > 1.76* 1.93* 1.74* 1.58*  GFRNONAA 38* 26* 26*   < > 29* 26* 29* 33*  GFRAA 44* 30* 30*  --   --   --   --   --    < > = values in this interval not displayed.    LIVER FUNCTION TESTS: Recent Labs    02/05/21 0914 03/12/21 1032 03/12/21 2016 03/13/21 0500  BILITOT 0.4 0.3 0.6 0.4  AST 22 15 20 15   ALT 9 11 15 12   ALKPHOS 134* 171* 155* 139*  PROT 7.8 7.2 7.8 6.9  ALBUMIN 4.1 3.5 4.0 3.6    TUMOR MARKERS: No results for input(s): AFPTM, CEA, CA199, CHROMGRNA in the last 8760 hours.  Assessment and Plan:  Acute GI Bleed -> CTA = active diverticular bleed arising from the proximal ascending colon in the right lower quadrant.  Images reviewed by Dr. Earleen Newport.  Will proceed with urgent mesenteric angiography with possible embolization today.  The Risks and benefits of embolization were discussed with the patient including, but not limited to bleeding, infection, vascular injury, post operative pain, or contrast induced renal failure.  This procedure involves the use of X-rays and because of the nature of the planned procedure, it is possible that we will have prolonged use of X-ray fluoroscopy.  Potential radiation risks to you include (but are not limited to) the following: - A slightly elevated risk for cancer several years later in life. This risk is typically less than 0.5% percent. This risk is low in comparison to the normal incidence of human cancer, which is 33% for women and 50% for men according to the [place]. - Radiation induced injury can include skin redness, resembling a rash, tissue breakdown / ulcers and hair loss (which can be temporary or permanent).   The likelihood of either of these occurring depends on the difficulty of the procedure and whether you are sensitive to radiation due to previous procedures, disease, or genetic conditions.   IF your  procedure requires a  prolonged use of radiation, you will be notified and given written instructions for further action.  It is your responsibility to monitor the irradiated area for the 2 weeks following the procedure and to notify your physician if you are concerned that you have suffered a radiation induced injury.    All of the patient's questions were answered, patient is agreeable to proceed. Consent signed and in chart.  Thank you for this interesting consult.  I greatly enjoyed meeting Lucila J Hobby and look forward to participating in their care.  A copy of this report was sent to the requesting provider on this date.  Electronically Signed: Murrell Redden, PA-C   03/13/2021, 12:21 PM      I spent a total of 40 Minutes in face to face in clinical consultation, greater than 50% of which was counseling/coordinating care for embo for GI Bleed.

## 2021-03-13 NOTE — Sedation Documentation (Signed)
5 Fr Exoseal deployed right groin

## 2021-03-13 NOTE — Procedures (Signed)
Interventional Radiology Procedure Note  Procedure:  [place] guided right CFA access, deployment of exoseal for hemostasis Mesenteric angiogram of the SMA, iliocolic artery, cecal branches.  Coil embolization of cecal branch contributing to acute extravasation of contrast Exoseal for hemostasis .  Complications: None  Recommendations:  - Right hip straight x 4 hours - log roll for bedpan ok - expect further hematochezia for 24-48 hours as the colon clears - Trend H&H - Do not submerge hip for 7 days - Routine wound care - VIR to follow   Signed,  Dulcy Fanny. Earleen Newport, DO

## 2021-03-13 NOTE — ED Notes (Signed)
Gave bedside report to Combs, Therapist, sports

## 2021-03-13 NOTE — ED Notes (Signed)
Patient transported to IR 

## 2021-03-14 DIAGNOSIS — K5731 Diverticulosis of large intestine without perforation or abscess with bleeding: Principal | ICD-10-CM

## 2021-03-14 LAB — CBC
HCT: 20.3 % — ABNORMAL LOW (ref 36.0–46.0)
Hemoglobin: 6.7 g/dL — CL (ref 12.0–15.0)
MCH: 26.6 pg (ref 26.0–34.0)
MCHC: 33 g/dL (ref 30.0–36.0)
MCV: 80.6 fL (ref 80.0–100.0)
Platelets: 109 10*3/uL — ABNORMAL LOW (ref 150–400)
RBC: 2.52 MIL/uL — ABNORMAL LOW (ref 3.87–5.11)
RDW: 20.8 % — ABNORMAL HIGH (ref 11.5–15.5)
WBC: 6.6 10*3/uL (ref 4.0–10.5)
nRBC: 0 % (ref 0.0–0.2)

## 2021-03-14 LAB — BASIC METABOLIC PANEL
Anion gap: 6 (ref 5–15)
BUN: 40 mg/dL — ABNORMAL HIGH (ref 8–23)
CO2: 22 mmol/L (ref 22–32)
Calcium: 9.1 mg/dL (ref 8.9–10.3)
Chloride: 113 mmol/L — ABNORMAL HIGH (ref 98–111)
Creatinine, Ser: 1.42 mg/dL — ABNORMAL HIGH (ref 0.44–1.00)
GFR, Estimated: 37 mL/min — ABNORMAL LOW (ref >60)
Glucose, Bld: 79 mg/dL (ref 70–99)
Potassium: 4.3 mmol/L (ref 3.5–5.1)
Sodium: 141 mmol/L (ref 135–145)

## 2021-03-14 LAB — HEMOGLOBIN AND HEMATOCRIT, BLOOD
HCT: 30.9 % — ABNORMAL LOW (ref 36.0–46.0)
Hemoglobin: 10.1 g/dL — ABNORMAL LOW (ref 12.0–15.0)

## 2021-03-14 LAB — PREPARE RBC (CROSSMATCH)

## 2021-03-14 MED ORDER — SODIUM CHLORIDE 0.9% IV SOLUTION
Freq: Once | INTRAVENOUS | Status: AC
Start: 2021-03-14 — End: 2021-03-14

## 2021-03-14 MED ORDER — PANTOPRAZOLE SODIUM 40 MG PO TBEC
40.0000 mg | DELAYED_RELEASE_TABLET | Freq: Every day | ORAL | Status: DC
  Administered 2021-03-14 – 2021-03-15 (×2): 40 mg via ORAL
  Filled 2021-03-14 (×2): qty 1

## 2021-03-14 NOTE — Progress Notes (Addendum)
PROGRESS NOTE    Susan [name]  KWI:[id number] DOB: 1941-04-19 DOA: 03/12/2021 PCP: Sonia Side., [name]  Brief Narrative: 80 year old female with history of chronic anemia, MDS, CKD 3 with baseline creatinine of 1.5-1.8 presented to the ED after multiple episodes of bright red blood per rectum x 2 days. -Early in the day of admission she saw Dr. Alen Blew her hematologist for routine follow-up, that day she had an episode of hematochezia, in the office she was noted to have a hemoglobin of 6.2 and subsequently transfused 2 units of PRBC at the cancer center, while at the cancer center she had 2 more episodes of hematochezia and subsequently sent to the ED. she denies any abdominal pain, denies fevers or chills -In the ED her hemoglobin was 9.1, repeat at 8.3,, creatinine was 1.5  Assessment & Plan:   Presumed diverticular bleeding Acute blood loss anemia -Transfused 2 units of PRBC on 6/10 -Urgent CTA abdomen pelvis 6/11, ,, noted to have active bleeding from diverticulum in the right colon, underwent selective angiography with coil embolization of cecal branch of SMA yesterday  -No further active bleeding, scant dark blood -likely old  -Hemoglobin down to 6.7 today we will transfuse 2 units of PRBC, likely still equilibrating  -Prior colonoscopy in 01/2017 with pandiverticulosis and internal hemorrhoids -Monitor hemoglobin closely -Advance diet  CKD 3a -baseline creatinine 1.5-1.8, creatinine now 1.4, got contrast load for CTA and angiography yesterday, fortunately creatinine is stable , discontinue IV fluids, getting blood today  -Monitor urine output and kidney function closely  Mild Dementia -continue aricept  GERD -Continue PPI  Essential hypertension -Hold amlodipine and labetalol  Hypothyroidism -Continue Synthroid  H/o MDS Chronic anemia -followed by Dr.Shadad  DVT prophylaxis:SCDs Code Status: Full Code Family Communication: No family at bedside, called and updated  son Disposition Plan:  Status is:  inpatient because: Inpatient level of care appropriate due to severity of illness  Dispo: The patient is from: Home              Anticipated d/c is to: Home              Patient currently is not medically stable to d/c.   Difficult to place patient No   Consultants:  [facility]  Procedures:   Antimicrobials: Procedure: Dr.Wagner IR 6/11 [place] guided right CFA access, deployment of exoseal for hemostasis Mesenteric angiogram of the SMA, iliocolic artery, cecal branches. Coil embolization of cecal branch contributing to acute extravasation of contrast   Subjective: -no fresh bleeding, small amount of dark stool x1  Objective: Vitals:   03/14/21 0001 03/14/21 0217 03/14/21 0440 03/14/21 0757  BP: 120/69  138/83 140/67  Pulse: 60  73 61  Resp: 16  20 17   Temp: 97.8 F (36.6 C)  97.8 F (36.6 C) 97.6 F (36.4 C)  TempSrc: Oral  Oral   SpO2: 100%  100% 100%  Weight:  51 kg    Height:        Intake/Output Summary (Last 24 hours) at 03/14/2021 1124 Last data filed at 03/13/2021 1856 Gross per 24 hour  Intake 617.5 ml  Output --  Net 617.5 ml   Filed Weights   03/13/21 1638 03/14/21 0217  Weight: 63 kg 51 kg    Examination:  General exam: Gen: Awake, Alert, Oriented X 2, no distress HEENT: no JVD Lungs: Good air movement bilaterally, CTAB CVS: S1S2/RRR Abd: soft, Non tender, non distended, BS present Extremities: No edema Skin: no new rashes  on exposed skin Psych: Appropriate mood and affect   Data Reviewed:   CBC: Recent Labs  Lab 03/12/21 1032 03/12/21 2016 03/13/21 0121 03/13/21 0500 03/13/21 1842 03/14/21 0519  WBC 7.6 7.9  --  5.3 8.1 6.6  NEUTROABS 5.8 7.2  --   --   --   --   HGB 6.2* 9.1* 9.0* 8.3* 8.0* 6.7*  HCT 19.5* 27.9* 27.5* 25.5* 24.9* 20.3*  MCV 75.9* 80.2  --  79.4* 82.2 80.6  PLT 127* 115*  --  101* 115* 798*   Basic Metabolic Panel: Recent Labs  Lab 03/12/21 1032 03/12/21 2016  03/13/21 0500 03/14/21 0519  NA 144 140 142 141  K 3.8 4.6 4.6 4.3  CL 111 109 112* 113*  CO2 24 21* 22 22  GLUCOSE 100* 185* 135* 79  BUN 41* 44* 43* 40*  CREATININE 1.93* 1.74* 1.58* 1.42*  CALCIUM 9.3 9.2 9.2 9.1  MG  --  2.3 2.3  --    GFR: Estimated Creatinine Clearance: 24.6 mL/min (A) (by C-G formula based on SCr of 1.42 mg/dL (H)). Liver Function Tests: Recent Labs  Lab 03/12/21 1032 03/12/21 2016 03/13/21 0500  AST 15 20 15   ALT 11 15 12   ALKPHOS 171* 155* 139*  BILITOT 0.3 0.6 0.4  PROT 7.2 7.8 6.9  ALBUMIN 3.5 4.0 3.6   Recent Labs  Lab 03/12/21 2016  LIPASE 75*   No results for input(s): AMMONIA in the last 168 hours. Coagulation Profile: Recent Labs  Lab 03/12/21 2016 03/13/21 0500  INR 1.2 1.2   Cardiac Enzymes: No results for input(s): CKTOTAL, CKMB, CKMBINDEX, TROPONINI in the last 168 hours. BNP (last 3 results) No results for input(s): PROBNP in the last 8760 hours. HbA1C: No results for input(s): HGBA1C in the last 72 hours. CBG: No results for input(s): GLUCAP in the last 168 hours. Lipid Profile: No results for input(s): CHOL, HDL, LDLCALC, TRIG, CHOLHDL, LDLDIRECT in the last 72 hours. Thyroid Function Tests: No results for input(s): TSH, T4TOTAL, FREET4, T3FREE, THYROIDAB in the last 72 hours. Anemia Panel: Recent Labs    03/12/21 1032  FERRITIN 19  TIBC 306  IRON 45   Urine analysis:    Component Value Date/Time   COLORURINE YELLOW 03/13/2021 0429   APPEARANCEUR CLEAR 03/13/2021 0429   LABSPEC 1.018 03/13/2021 0429   PHURINE 5.0 03/13/2021 0429   GLUCOSEU NEGATIVE 03/13/2021 0429   HGBUR NEGATIVE 03/13/2021 0429   BILIRUBINUR NEGATIVE 03/13/2021 0429   KETONESUR NEGATIVE 03/13/2021 0429   PROTEINUR NEGATIVE 03/13/2021 0429   UROBILINOGEN 1.0 07/07/2015 1917   NITRITE NEGATIVE 03/13/2021 0429   LEUKOCYTESUR NEGATIVE 03/13/2021 0429   Sepsis Labs: @LABRCNTIP (procalcitonin:4,lacticidven:4)  ) Recent Results (from  the past 240 hour(s))  SARS CORONAVIRUS 2 (TAT 6-24 HRS) Nasopharyngeal Nasopharyngeal Swab     Status: None   Collection Time: 03/12/21 10:47 PM   Specimen: Nasopharyngeal Swab  Result Value Ref Range Status   SARS Coronavirus 2 NEGATIVE NEGATIVE Final    Comment: (NOTE) SARS-CoV-2 target nucleic acids are NOT DETECTED.  The SARS-CoV-2 RNA is generally detectable in upper and lower respiratory specimens during the acute phase of infection. Negative results do not preclude SARS-CoV-2 infection, do not rule out co-infections with other pathogens, and should not be used as the sole basis for treatment or other patient management decisions. Negative results must be combined with clinical observations, patient history, and epidemiological information. The expected result is Negative.  Fact Sheet for Patients: SugarRoll.be  Fact  Sheet for Healthcare Providers: https://www.woods-mathews.com/  This test is not yet approved or cleared by the [place] FDA and  has been authorized for detection and/or diagnosis of SARS-CoV-2 by FDA under an Emergency Use Authorization (EUA). This EUA will remain  in effect (meaning this test can be used) for the duration of the COVID-19 declaration under Se ction 564(b)(1) of the Act, 21 U.S.C. section 360bbb-3(b)(1), unless the authorization is terminated or revoked sooner.  Performed at [facility], [place] [address] [place], [place] [address]     Scheduled Meds:  sodium chloride   Intravenous Once   allopurinol  300 mg Oral Daily   donepezil  10 mg Oral QHS   fluticasone  1 spray Each Nare Daily   levothyroxine  75 mcg Oral Q0600   memantine  10 mg Oral Daily   pantoprazole  40 mg Oral Q1200   Continuous Infusions:   LOS: 1 day    Time spent: 58min  Domenic Polite, MD [facility]   03/14/2021, 11:24 AM

## 2021-03-14 NOTE — Progress Notes (Addendum)
Referring Physician(s): Lovell Sheehan Pyrtle  Supervising Physician: Corrie Mckusick  Patient Status:  [facility] - In-pt  Chief Complaint:  Follow up embo for GI bleed  Brief History:  Susan [name] is a 80 y.o. female with medical issues including chronic anemia with baseline hemoglobin 7-9, myelodysplastic syndrome, GERD, hypertension, stage IIIb chronic kidney disease with baseline creatinine 1.5-1.8.   She presented to [facility] ED on 03/12/2021 with suspected acute lower GI bleed.    She reported at least 3 episodes of hematochezia over the last 24 hours, with first such episode occurring on the evening of 03/11/2021.     She notified her outpatient hematologist, Dr. Alen Blew, who ordered CBC the morning of 6/10.   Hemoglobin was 6.2 so she received transfusion of 2 units PRBC at the outpatient transfusion center.   During the infusion she had 2 additional episodes of hematochezia.   Dr. Alen Blew instructed her to go the ED further evaluation of suspected GI bleed.      CTA showed= Positive for active diverticular bleed arising from the proximal ascending colon in the right lower quadrant.   Yesterday she underwent mesenteric angiography with embolization of a cecal branch contributing to acute extravasation of contrast.  Subjective:  Feeling better. No fresh blood in stool per patient.   Allergies: Codeine, Penicillins, Bactrim, Clarithromycin, Flagyl [metronidazole hcl], Food, Pineapple, and Sulfa antibiotics  Medications: Prior to Admission medications   Medication Sig Start Date End Date Taking? Authorizing Provider  acetaminophen (TYLENOL) 500 MG tablet Take 500-1,000 mg by mouth every 6 (six) hours as needed (for pain.).   Yes [provider]  allopurinol (ZYLOPRIM) 300 MG tablet Take 300 mg by mouth daily. 07/20/20  Yes [provider]  amLODipine (NORVASC) 10 MG tablet TAKE 1/2 TABLET BY MOUTH DAILY AT BEDTIME; (TAKE 1 TABLET IF SBP IS  >150) Patient taking differently: Take 10 mg by mouth at bedtime. 07/20/17  Yes [name], NP  aspirin 81 MG chewable tablet Chew 81 mg by mouth daily.   Yes [provider]  donepezil (ARICEPT) 10 MG tablet Take 10 mg by mouth at bedtime. 07/20/20  Yes [provider]  famotidine (PEPCID) 20 MG tablet Take 20 mg by mouth 2 (two) times daily. 03/08/21  Yes [provider]  labetalol (NORMODYNE) 200 MG tablet Take 200 mg by mouth 2 (two) times daily. 06/23/20  Yes [provider]  levothyroxine (SYNTHROID) 75 MCG tablet Take 75 mcg by mouth every morning. 12/18/20  Yes [provider]  meloxicam (MOBIC) 7.5 MG tablet Take 7.5 mg by mouth 2 (two) times daily as needed for pain. 02/26/21  Yes [provider]  memantine (NAMENDA) 10 MG tablet Take 1 tablet (10 mg total) by mouth 2 (two) times daily. 11/30/20  Yes [name], NP  mirtazapine (REMERON) 15 MG tablet Take 15 mg by mouth at bedtime. 07/20/20  Yes [provider]  montelukast (SINGULAIR) 10 MG tablet Take 10 mg by mouth at bedtime.   Yes [provider]  naproxen sodium (ALEVE) 220 MG tablet Take 220 mg by mouth daily.   Yes [provider]  pantoprazole (PROTONIX) 40 MG tablet Take 1 tablet (40 mg total) by mouth 2 (two) times daily before a meal. 01/10/21  Yes Little Ishikawa, MD  diphenhydrAMINE (BENADRYL) 25 mg capsule One capsule every 6 hours x 3 days, then as needed for symptoms of rash/itching Patient not taking: Reported on 03/13/2021 03/25/17  Charlann Lange, PA-C     Vital Signs: BP 119/67   Pulse 65   Temp 98.2 F (36.8 C) (Oral)   Resp 13   Ht 5\' 2"  (1.575 m)   Wt 51 kg   SpO2 100%   BMI 20.56 kg/m   Physical Exam Vitals reviewed.  Constitutional:      Appearance: Normal appearance.  Cardiovascular:     Rate and Rhythm: Normal rate.  Pulmonary:     Effort: Pulmonary effort is normal. No respiratory distress.  Abdominal:      Palpations: Abdomen is soft.  Musculoskeletal:     Comments: Right CFA access site looks good, no pseudoaneurysm, no hematoma.  Neurological:     General: No focal deficit present.     Mental Status: She is alert and oriented to person, place, and time.  Psychiatric:        Mood and Affect: Mood normal.        Behavior: Behavior normal.        Thought Content: Thought content normal.        Judgment: Judgment normal.    Imaging: IR Angiogram Visceral Selective  Result Date: 03/13/2021 INDICATION: 80 year old female presents with acute lower GI bleeding, isolated to the right colon on recent CT angiogram, with attempt for angiogram and embolization EXAM: ULTRASOUND-GUIDED ACCESS RIGHT COMMON FEMORAL ARTERY MESENTERIC ANGIOGRAM INCLUDING SMA, ILEOCOLIC ARTERY, AND BRANCHES OF THE COLIC ARTERIES COIL EMBOLIZATION COLIC ARTERY EXOSEAL FOR HEMOSTASIS MEDICATIONS: NONE ANESTHESIA/SEDATION: Moderate (conscious) sedation was employed during this procedure. A total of Versed 1.5 mg and Fentanyl 25 mcg was administered intravenously. Moderate Sedation Time: 50 minutes. The patient's level of consciousness and vital signs were monitored continuously by radiology nursing throughout the procedure under my direct supervision. CONTRAST:  65 cc FLUOROSCOPY TIME:  Fluoroscopy Time: 10 minutes 18 seconds (2,075 mGy). COMPLICATIONS: None PROCEDURE: Informed consent was obtained from the patient following explanation of the procedure, risks, benefits and alternatives. The patient understands, agrees and consents for the procedure. All questions were addressed. A time out was performed prior to the initiation of the procedure. Maximal barrier sterile technique utilized including caps, mask, sterile gowns, sterile gloves, large sterile drape, hand hygiene, and Betadine prep. Ultrasound survey of the right inguinal region was performed with images stored and sent to PACs, confirming patency of the vessel. A micropuncture  needle was used access the right common femoral artery under ultrasound. With excellent arterial blood flow returned, and an .018 micro wire was passed through the needle, observed enter the abdominal aorta under fluoroscopy. The needle was removed, and a micropuncture sheath was placed over the wire. The inner dilator and wire were removed, and an 035 Bentson wire was advanced under fluoroscopy into the abdominal aorta. The sheath was removed and a standard 5 [place] vascular sheath was placed. The dilator was removed and the sheath was flushed. C2 Cobra catheter advanced on the Bentson wire into the abdominal aorta. Wire was removed and the catheter was used to engage the SMA. Angiogram was performed. A high-flow Renegade catheter was then advanced with a coaxial 14 fathom wire into the SMA, ileocolic artery. Angiogram was performed. The coaxial catheter wire were then further advanced into the colic branches, targeting a site of observed extravasation of contrast, correlating to the prior CT. Multiple obliquity, angiogram performed for isolation of the branch artery. Once we were confident that the microcatheter was within the branch artery, we attempted to navigate the catheter to a distal position. Given  the tortuosity, only a proximal position within the terminal branch could be achieved. Coil embolization was then performed with a single 3 mm x 4 cm low-profile penumbra Ruby coil. After deployment of the coil and withdrawal of the catheter angiogram confirmed no further flow or evidence of extravasation at the site. All catheters wires were removed. Exoseal was deployed for hemostasis. IMPRESSION: Status post ultrasound guided access right common femoral artery for mesenteric angiogram including SMA, ileocolic artery, and super selective catheter position into the colic arteries, for isolation of hemorrhagic colic artery branch and coil embolization. Exoseal for hemostasis. Signed, Dulcy Fanny. Dellia Nims, RPVI  [facility] Electronically Signed   By: Corrie Mckusick D.O.   On: 03/13/2021 14:37   IR Angiogram Follow Up Study  Result Date: 03/13/2021 INDICATION: 80 year old female presents with acute lower GI bleeding, isolated to the right colon on recent CT angiogram, with attempt for angiogram and embolization EXAM: ULTRASOUND-GUIDED ACCESS RIGHT COMMON FEMORAL ARTERY MESENTERIC ANGIOGRAM INCLUDING SMA, ILEOCOLIC ARTERY, AND BRANCHES OF THE COLIC ARTERIES COIL EMBOLIZATION COLIC ARTERY EXOSEAL FOR HEMOSTASIS MEDICATIONS: NONE ANESTHESIA/SEDATION: Moderate (conscious) sedation was employed during this procedure. A total of Versed 1.5 mg and Fentanyl 25 mcg was administered intravenously. Moderate Sedation Time: 50 minutes. The patient's level of consciousness and vital signs were monitored continuously by radiology nursing throughout the procedure under my direct supervision. CONTRAST:  65 cc FLUOROSCOPY TIME:  Fluoroscopy Time: 10 minutes 18 seconds (2,075 mGy). COMPLICATIONS: None PROCEDURE: Informed consent was obtained from the patient following explanation of the procedure, risks, benefits and alternatives. The patient understands, agrees and consents for the procedure. All questions were addressed. A time out was performed prior to the initiation of the procedure. Maximal barrier sterile technique utilized including caps, mask, sterile gowns, sterile gloves, large sterile drape, hand hygiene, and Betadine prep. Ultrasound survey of the right inguinal region was performed with images stored and sent to PACs, confirming patency of the vessel. A micropuncture needle was used access the right common femoral artery under ultrasound. With excellent arterial blood flow returned, and an .018 micro wire was passed through the needle, observed enter the abdominal aorta under fluoroscopy. The needle was removed, and a micropuncture sheath was placed over the wire. The  inner dilator and wire were removed, and an 035 Bentson wire was advanced under fluoroscopy into the abdominal aorta. The sheath was removed and a standard 5 [place] vascular sheath was placed. The dilator was removed and the sheath was flushed. C2 Cobra catheter advanced on the Bentson wire into the abdominal aorta. Wire was removed and the catheter was used to engage the SMA. Angiogram was performed. A high-flow Renegade catheter was then advanced with a coaxial 14 fathom wire into the SMA, ileocolic artery. Angiogram was performed. The coaxial catheter wire were then further advanced into the colic branches, targeting a site of observed extravasation of contrast, correlating to the prior CT. Multiple obliquity, angiogram performed for isolation of the branch artery. Once we were confident that the microcatheter was within the branch artery, we attempted to navigate the catheter to a distal position. Given the tortuosity, only a proximal position within the terminal branch could be achieved. Coil embolization was then performed with a single 3 mm x 4 cm low-profile penumbra Ruby coil. After deployment of the coil and withdrawal of the catheter angiogram confirmed no further flow or evidence of extravasation at the site. All catheters wires were removed. Exoseal was deployed for hemostasis.  IMPRESSION: Status post ultrasound guided access right common femoral artery for mesenteric angiogram including SMA, ileocolic artery, and super selective catheter position into the colic arteries, for isolation of hemorrhagic colic artery branch and coil embolization. Exoseal for hemostasis. Signed, Dulcy Fanny. Dellia Nims, RPVI [facility] Electronically Signed   By: Corrie Mckusick D.O.   On: 03/13/2021 14:37   IR US Guide Vasc Access Right  Result Date: 03/13/2021 INDICATION: 80 year old female presents with acute lower GI bleeding, isolated to the right colon on recent  CT angiogram, with attempt for angiogram and embolization EXAM: ULTRASOUND-GUIDED ACCESS RIGHT COMMON FEMORAL ARTERY MESENTERIC ANGIOGRAM INCLUDING SMA, ILEOCOLIC ARTERY, AND BRANCHES OF THE COLIC ARTERIES COIL EMBOLIZATION COLIC ARTERY EXOSEAL FOR HEMOSTASIS MEDICATIONS: NONE ANESTHESIA/SEDATION: Moderate (conscious) sedation was employed during this procedure. A total of Versed 1.5 mg and Fentanyl 25 mcg was administered intravenously. Moderate Sedation Time: 50 minutes. The patient's level of consciousness and vital signs were monitored continuously by radiology nursing throughout the procedure under my direct supervision. CONTRAST:  65 cc FLUOROSCOPY TIME:  Fluoroscopy Time: 10 minutes 18 seconds (2,075 mGy). COMPLICATIONS: None PROCEDURE: Informed consent was obtained from the patient following explanation of the procedure, risks, benefits and alternatives. The patient understands, agrees and consents for the procedure. All questions were addressed. A time out was performed prior to the initiation of the procedure. Maximal barrier sterile technique utilized including caps, mask, sterile gowns, sterile gloves, large sterile drape, hand hygiene, and Betadine prep. Ultrasound survey of the right inguinal region was performed with images stored and sent to PACs, confirming patency of the vessel. A micropuncture needle was used access the right common femoral artery under ultrasound. With excellent arterial blood flow returned, and an .018 micro wire was passed through the needle, observed enter the abdominal aorta under fluoroscopy. The needle was removed, and a micropuncture sheath was placed over the wire. The inner dilator and wire were removed, and an 035 Bentson wire was advanced under fluoroscopy into the abdominal aorta. The sheath was removed and a standard 5 [place] vascular sheath was placed. The dilator was removed and the sheath was flushed. C2 Cobra catheter advanced on the Bentson wire into the  abdominal aorta. Wire was removed and the catheter was used to engage the SMA. Angiogram was performed. A high-flow Renegade catheter was then advanced with a coaxial 14 fathom wire into the SMA, ileocolic artery. Angiogram was performed. The coaxial catheter wire were then further advanced into the colic branches, targeting a site of observed extravasation of contrast, correlating to the prior CT. Multiple obliquity, angiogram performed for isolation of the branch artery. Once we were confident that the microcatheter was within the branch artery, we attempted to navigate the catheter to a distal position. Given the tortuosity, only a proximal position within the terminal branch could be achieved. Coil embolization was then performed with a single 3 mm x 4 cm low-profile penumbra Ruby coil. After deployment of the coil and withdrawal of the catheter angiogram confirmed no further flow or evidence of extravasation at the site. All catheters wires were removed. Exoseal was deployed for hemostasis. IMPRESSION: Status post ultrasound guided access right common femoral artery for mesenteric angiogram including SMA, ileocolic artery, and super selective catheter position into the colic arteries, for isolation of hemorrhagic colic artery branch and coil embolization. Exoseal for hemostasis. Signed, Dulcy Fanny. Dellia Nims, [place] [facility]iology Electronically Signed   By: Corrie Mckusick D.O.  On: 03/13/2021 14:37   IR EMBO ART  VEN HEMORR LYMPH EXTRAV  INC GUIDE ROADMAPPING  Result Date: 03/13/2021 INDICATION: 80 year old female presents with acute lower GI bleeding, isolated to the right colon on recent CT angiogram, with attempt for angiogram and embolization EXAM: ULTRASOUND-GUIDED ACCESS RIGHT COMMON FEMORAL ARTERY MESENTERIC ANGIOGRAM INCLUDING SMA, ILEOCOLIC ARTERY, AND BRANCHES OF THE COLIC ARTERIES COIL EMBOLIZATION COLIC ARTERY EXOSEAL FOR HEMOSTASIS  MEDICATIONS: NONE ANESTHESIA/SEDATION: Moderate (conscious) sedation was employed during this procedure. A total of Versed 1.5 mg and Fentanyl 25 mcg was administered intravenously. Moderate Sedation Time: 50 minutes. The patient's level of consciousness and vital signs were monitored continuously by radiology nursing throughout the procedure under my direct supervision. CONTRAST:  65 cc FLUOROSCOPY TIME:  Fluoroscopy Time: 10 minutes 18 seconds (2,075 mGy). COMPLICATIONS: None PROCEDURE: Informed consent was obtained from the patient following explanation of the procedure, risks, benefits and alternatives. The patient understands, agrees and consents for the procedure. All questions were addressed. A time out was performed prior to the initiation of the procedure. Maximal barrier sterile technique utilized including caps, mask, sterile gowns, sterile gloves, large sterile drape, hand hygiene, and Betadine prep. Ultrasound survey of the right inguinal region was performed with images stored and sent to PACs, confirming patency of the vessel. A micropuncture needle was used access the right common femoral artery under ultrasound. With excellent arterial blood flow returned, and an .018 micro wire was passed through the needle, observed enter the abdominal aorta under fluoroscopy. The needle was removed, and a micropuncture sheath was placed over the wire. The inner dilator and wire were removed, and an 035 Bentson wire was advanced under fluoroscopy into the abdominal aorta. The sheath was removed and a standard 5 [place] vascular sheath was placed. The dilator was removed and the sheath was flushed. C2 Cobra catheter advanced on the Bentson wire into the abdominal aorta. Wire was removed and the catheter was used to engage the SMA. Angiogram was performed. A high-flow Renegade catheter was then advanced with a coaxial 14 fathom wire into the SMA, ileocolic artery. Angiogram was performed. The coaxial catheter wire  were then further advanced into the colic branches, targeting a site of observed extravasation of contrast, correlating to the prior CT. Multiple obliquity, angiogram performed for isolation of the branch artery. Once we were confident that the microcatheter was within the branch artery, we attempted to navigate the catheter to a distal position. Given the tortuosity, only a proximal position within the terminal branch could be achieved. Coil embolization was then performed with a single 3 mm x 4 cm low-profile penumbra Ruby coil. After deployment of the coil and withdrawal of the catheter angiogram confirmed no further flow or evidence of extravasation at the site. All catheters wires were removed. Exoseal was deployed for hemostasis. IMPRESSION: Status post ultrasound guided access right common femoral artery for mesenteric angiogram including SMA, ileocolic artery, and super selective catheter position into the colic arteries, for isolation of hemorrhagic colic artery branch and coil embolization. Exoseal for hemostasis. Signed, Dulcy Fanny. Dellia Nims, RPVI [facility] Electronically Signed   By: Corrie Mckusick D.O.   On: 03/13/2021 14:37   CT Angio Abd/Pel w/ and/or w/o  Result Date: 03/13/2021 CLINICAL DATA:  80 year old female with suspected lower GI bleeding. 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 EXAM: CTA ABDOMEN AND PELVIS WITHOUT AND WITH CONTRAST TECHNIQUE: Multidetector CT imaging of the abdomen and pelvis was performed  using the standard protocol during bolus administration of intravenous contrast. Multiplanar reconstructed images and MIPs were obtained and reviewed to evaluate the vascular anatomy. CONTRAST:  3mL OMNIPAQUE IOHEXOL 350 MG/ML SOLN COMPARISON:  CT abdomen/pelvis 01/05/2016 FINDINGS: VASCULAR Aorta: Normal caliber aorta without aneurysm, dissection, vasculitis or significant  stenosis. Scattered atherosclerotic vascular calcifications. Celiac: Patent without evidence of aneurysm, dissection, vasculitis or significant stenosis. SMA: Patent without evidence of aneurysm, dissection, vasculitis or significant stenosis. Renals: Both renal arteries are patent without evidence of aneurysm, dissection, vasculitis, fibromuscular dysplasia or significant stenosis. IMA: Patent without evidence of aneurysm, dissection, vasculitis or significant stenosis. Inflow: Patent without evidence of aneurysm, dissection, vasculitis or significant stenosis. Proximal Outflow: Bilateral common femoral and visualized portions of the superficial and profunda femoral arteries are patent without evidence of aneurysm, dissection, vasculitis or significant stenosis. Veins: No focal venous abnormality. Review of the MIP images confirms the above findings. NON-VASCULAR Lower chest: No acute abnormality.  Cardiomegaly. Hepatobiliary: No focal liver abnormality is seen. No gallstones, gallbladder wall thickening, or biliary dilatation. Pancreas: Unremarkable. No pancreatic ductal dilatation or surrounding inflammatory changes. Spleen: Normal in size without focal abnormality. Adrenals/Urinary Tract: Adrenal glands are normal in appearance. No hydronephrosis, nephrolithiasis or enhancing renal mass. Exophytic simple cysts from both kidneys. The ureters and bladder are unremarkable. Stomach/Bowel: Extensive colonic diverticulosis. Extravasation of arterial phase contrast present within a diverticulum arising from the medial aspect of the ascending colon (image 114 series 6) with evidence of blooming of the extravasated contrast on the venous phase images. Findings are consistent with acute diverticular bleed. There is extensive colonic diverticulosis most profound in the sigmoid colon. No evidence of active inflammation to suggest acute diverticulitis. No focal bowel wall thickening or evidence of obstruction. Unremarkable  stomach and proximal small bowel. Normal appendix. Lymphatic: No suspicious lymphadenopathy. Reproductive: Uterus and bilateral adnexa are unremarkable. Other: Trace free fluid in the pelvis is abnormal in a post reproductive age female. The fluid is low in attenuation. Musculoskeletal: No acute fracture or aggressive appearing lytic or blastic osseous lesion. Chronic L1 compression fracture with out significant change in height loss. Advanced multilevel degenerative disc disease. Grade 1 anterolisthesis of L5 on S1. Lower lumbar facet arthropathy again noted. IMPRESSION: 1. Positive for active diverticular bleed arising from the proximal ascending colon in the right lower quadrant. 2. Extensive colonic diverticulosis without evidence of active inflammation. 3. Small volume free fluid in the anatomic pelvis is nonspecific but abnormal for a post reproductive age female. 4. Tortuous and atherosclerotic abdominal aorta. Aortic Atherosclerosis (ICD10-I70.0). 5. Cardiomegaly. 6. Stable chronic L1 compression fracture. 7. Multilevel degenerative disc disease with grade 1 anterolisthesis of L5 on S1. These results were discussed in person at the time of interpretation on 03/13/2021 at 11:48 am to provider JAY PYRTLE , who verbally acknowledged these results. Electronically Signed   By: Jacqulynn Cadet M.D.   On: 03/13/2021 11:48    Labs:  CBC: Recent Labs    03/12/21 2016 03/13/21 0121 03/13/21 0500 03/13/21 1842 03/14/21 0519  WBC 7.9  --  5.3 8.1 6.6  HGB 9.1* 9.0* 8.3* 8.0* 6.7*  HCT 27.9* 27.5* 25.5* 24.9* 20.3*  PLT 115*  --  101* 115* 109*    COAGS: Recent Labs    03/12/21 2016 03/13/21 0500  INR 1.2 1.2    BMP: Recent Labs    03/20/20 0919 04/17/20 1135 05/22/20 1123 07/17/20 1033 03/12/21 1032 03/12/21 2016 03/13/21 0500 03/14/21 0519  NA 143 143 141   < >  144 140 142 141  K 4.0 4.8 4.0   < > 3.8 4.6 4.6 4.3  CL 110 114* 109   < > 111 109 112* 113*  CO2 24 21* 24   < > 24  21* 22 22  GLUCOSE 73 91 97   < > 100* 185* 135* 79  BUN 27* 38* 33*   < > 41* 44* 43* 40*  CALCIUM 10.4* 10.0 10.5*   < > 9.3 9.2 9.2 9.1  CREATININE 1.31* 1.81* 1.79*   < > 1.93* 1.74* 1.58* 1.42*  GFRNONAA 38* 26* 26*   < > 26* 29* 33* 37*  GFRAA 44* 30* 30*  --   --   --   --   --    < > = values in this interval not displayed.    LIVER FUNCTION TESTS: Recent Labs    02/05/21 0914 03/12/21 1032 03/12/21 2016 03/13/21 0500  BILITOT 0.4 0.3 0.6 0.4  AST 22 15 20 15   ALT 9 11 15 12   ALKPHOS 134* 171* 155* 139*  PROT 7.8 7.2 7.8 6.9  ALBUMIN 4.1 3.5 4.0 3.6    Assessment and Plan:  GI bleed = s/p mesenteric angiography and embolization of a cecal branch contributing to acute extravasation of contrast.  Hgb down to 6.6. Getting PRBCs. Could be secondary to hemodilution or still equilibrating as bleeding is stopped.   Normal Saline had been going at 75 mL/ hr, has since been stopped.   Can remove CFA access site dressing tomorrow. Ok to shower but do not submerge x 1 week.  Will sign off.  Electronically Signed: Murrell Redden, PA-C 03/14/2021, 12:53 PM    I spent a total of 15 Minutes at the the patient's bedside AND on the patient's hospital floor or unit, greater than 50% of which was counseling/coordinating care for f/u GI bleed.

## 2021-03-14 NOTE — Progress Notes (Signed)
Progress Note   Subjective  Patient reports feeling much better today, no abdominal pain Had a bowel movement with no fresh or maroon stool, small amounts of dark stool patient states stool looking more normal No nausea or vomiting   Objective  Vital signs in last 24 hours: Temp:  [97.6 F (36.4 C)-98.6 F (37 C)] 97.6 F (36.4 C) (06/12 0757) Pulse Rate:  [59-73] 61 (06/12 0757) Resp:  [10-20] 17 (06/12 0757) BP: (120-146)/(64-99) 140/67 (06/12 0757) SpO2:  [99 %-100 %] 100 % (06/12 0757) Weight:  [51 kg-63 kg] 51 kg (06/12 0217) Last BM Date: 03/13/21  Gen: awake, alert, NAD HEENT: anicteric  CV: RRR, no mrg Pulm: CTA b/l Abd: soft, NT/ND, +BS throughout Ext: no c/c/e Neuro: nonfocal   Intake/Output from previous day: 06/11 0701 - 06/12 0700 In: 617.5 [I.V.:617.5] Out: -  Intake/Output this shift: No intake/output data recorded.  Lab Results: Recent Labs    03/13/21 0500 03/13/21 1842 03/14/21 0519  WBC 5.3 8.1 6.6  HGB 8.3* 8.0* 6.7*  HCT 25.5* 24.9* 20.3*  PLT 101* 115* 109*   BMET Recent Labs    03/12/21 2016 03/13/21 0500 03/14/21 0519  NA 140 142 141  K 4.6 4.6 4.3  CL 109 112* 113*  CO2 21* 22 22  GLUCOSE 185* 135* 79  BUN 44* 43* 40*  CREATININE 1.74* 1.58* 1.42*  CALCIUM 9.2 9.2 9.1   LFT Recent Labs    03/13/21 0500  PROT 6.9  ALBUMIN 3.6  AST 15  ALT 12  ALKPHOS 139*  BILITOT 0.4   PT/INR Recent Labs    03/12/21 2016 03/13/21 0500  LABPROT 14.7 14.8  INR 1.2 1.2   Hepatitis Panel No results for input(s): HEPBSAG, HCVAB, HEPAIGM, HEPBIGM in the last 72 hours.  Studies/Results: IR Angiogram Visceral Selective  Result Date: 03/13/2021 INDICATION: 80 year old female presents with acute lower GI bleeding, isolated to the right colon on recent CT angiogram, with attempt for angiogram and embolization EXAM: ULTRASOUND-GUIDED ACCESS RIGHT COMMON FEMORAL ARTERY MESENTERIC ANGIOGRAM INCLUDING SMA, ILEOCOLIC ARTERY, AND  BRANCHES OF THE COLIC ARTERIES COIL EMBOLIZATION COLIC ARTERY EXOSEAL FOR HEMOSTASIS MEDICATIONS: NONE ANESTHESIA/SEDATION: Moderate (conscious) sedation was employed during this procedure. A total of Versed 1.5 mg and Fentanyl 25 mcg was administered intravenously. Moderate Sedation Time: 50 minutes. The patient's level of consciousness and vital signs were monitored continuously by radiology nursing throughout the procedure under my direct supervision. CONTRAST:  65 cc FLUOROSCOPY TIME:  Fluoroscopy Time: 10 minutes 18 seconds (2,075 mGy). COMPLICATIONS: None PROCEDURE: Informed consent was obtained from the patient following explanation of the procedure, risks, benefits and alternatives. The patient understands, agrees and consents for the procedure. All questions were addressed. A time out was performed prior to the initiation of the procedure. Maximal barrier sterile technique utilized including caps, mask, sterile gowns, sterile gloves, large sterile drape, hand hygiene, and Betadine prep. Ultrasound survey of the right inguinal region was performed with images stored and sent to PACs, confirming patency of the vessel. A micropuncture needle was used access the right common femoral artery under ultrasound. With excellent arterial blood flow returned, and an .018 micro wire was passed through the needle, observed enter the abdominal aorta under fluoroscopy. The needle was removed, and a micropuncture sheath was placed over the wire. The inner dilator and wire were removed, and an 035 Bentson wire was advanced under fluoroscopy into the abdominal aorta. The sheath was removed and a standard 5 [place] vascular sheath  was placed. The dilator was removed and the sheath was flushed. C2 Cobra catheter advanced on the Bentson wire into the abdominal aorta. Wire was removed and the catheter was used to engage the SMA. Angiogram was performed. A high-flow Renegade catheter was then advanced with a coaxial 14 fathom wire  into the SMA, ileocolic artery. Angiogram was performed. The coaxial catheter wire were then further advanced into the colic branches, targeting a site of observed extravasation of contrast, correlating to the prior CT. Multiple obliquity, angiogram performed for isolation of the branch artery. Once we were confident that the microcatheter was within the branch artery, we attempted to navigate the catheter to a distal position. Given the tortuosity, only a proximal position within the terminal branch could be achieved. Coil embolization was then performed with a single 3 mm x 4 cm low-profile penumbra Ruby coil. After deployment of the coil and withdrawal of the catheter angiogram confirmed no further flow or evidence of extravasation at the site. All catheters wires were removed. Exoseal was deployed for hemostasis. IMPRESSION: Status post ultrasound guided access right common femoral artery for mesenteric angiogram including SMA, ileocolic artery, and super selective catheter position into the colic arteries, for isolation of hemorrhagic colic artery branch and coil embolization. Exoseal for hemostasis. Signed, Dulcy Fanny. Dellia Nims, RPVI [facility] Radiology Electronically Signed   By: Corrie Mckusick D.O.   On: 03/13/2021 14:37   IR Angiogram Follow Up Study  Result Date: 03/13/2021 INDICATION: 80 year old female presents with acute lower GI bleeding, isolated to the right colon on recent CT angiogram, with attempt for angiogram and embolization EXAM: ULTRASOUND-GUIDED ACCESS RIGHT COMMON FEMORAL ARTERY MESENTERIC ANGIOGRAM INCLUDING SMA, ILEOCOLIC ARTERY, AND BRANCHES OF THE COLIC ARTERIES COIL EMBOLIZATION COLIC ARTERY EXOSEAL FOR HEMOSTASIS MEDICATIONS: NONE ANESTHESIA/SEDATION: Moderate (conscious) sedation was employed during this procedure. A total of Versed 1.5 mg and Fentanyl 25 mcg was administered intravenously. Moderate Sedation Time: 50 minutes. The  patient's level of consciousness and vital signs were monitored continuously by radiology nursing throughout the procedure under my direct supervision. CONTRAST:  65 cc FLUOROSCOPY TIME:  Fluoroscopy Time: 10 minutes 18 seconds (2,075 mGy). COMPLICATIONS: None PROCEDURE: Informed consent was obtained from the patient following explanation of the procedure, risks, benefits and alternatives. The patient understands, agrees and consents for the procedure. All questions were addressed. A time out was performed prior to the initiation of the procedure. Maximal barrier sterile technique utilized including caps, mask, sterile gowns, sterile gloves, large sterile drape, hand hygiene, and Betadine prep. Ultrasound survey of the right inguinal region was performed with images stored and sent to PACs, confirming patency of the vessel. A micropuncture needle was used access the right common femoral artery under ultrasound. With excellent arterial blood flow returned, and an .018 micro wire was passed through the needle, observed enter the abdominal aorta under fluoroscopy. The needle was removed, and a micropuncture sheath was placed over the wire. The inner dilator and wire were removed, and an 035 Bentson wire was advanced under fluoroscopy into the abdominal aorta. The sheath was removed and a standard 5 [place] vascular sheath was placed. The dilator was removed and the sheath was flushed. C2 Cobra catheter advanced on the Bentson wire into the abdominal aorta. Wire was removed and the catheter was used to engage the SMA. Angiogram was performed. A high-flow Renegade catheter was then advanced with a coaxial 14 fathom wire into the SMA, ileocolic artery. Angiogram was performed. The coaxial catheter  wire were then further advanced into the colic branches, targeting a site of observed extravasation of contrast, correlating to the prior CT. Multiple obliquity, angiogram performed for isolation of the branch artery. Once we  were confident that the microcatheter was within the branch artery, we attempted to navigate the catheter to a distal position. Given the tortuosity, only a proximal position within the terminal branch could be achieved. Coil embolization was then performed with a single 3 mm x 4 cm low-profile penumbra Ruby coil. After deployment of the coil and withdrawal of the catheter angiogram confirmed no further flow or evidence of extravasation at the site. All catheters wires were removed. Exoseal was deployed for hemostasis. IMPRESSION: Status post ultrasound guided access right common femoral artery for mesenteric angiogram including SMA, ileocolic artery, and super selective catheter position into the colic arteries, for isolation of hemorrhagic colic artery branch and coil embolization. Exoseal for hemostasis. Signed, Dulcy Fanny. Dellia Nims, RPVI [facility] Radiology Electronically Signed   By: Corrie Mckusick D.O.   On: 03/13/2021 14:37   IR US Guide Vasc Access Right  Result Date: 03/13/2021 INDICATION: 80 year old female presents with acute lower GI bleeding, isolated to the right colon on recent CT angiogram, with attempt for angiogram and embolization EXAM: ULTRASOUND-GUIDED ACCESS RIGHT COMMON FEMORAL ARTERY MESENTERIC ANGIOGRAM INCLUDING SMA, ILEOCOLIC ARTERY, AND BRANCHES OF THE COLIC ARTERIES COIL EMBOLIZATION COLIC ARTERY EXOSEAL FOR HEMOSTASIS MEDICATIONS: NONE ANESTHESIA/SEDATION: Moderate (conscious) sedation was employed during this procedure. A total of Versed 1.5 mg and Fentanyl 25 mcg was administered intravenously. Moderate Sedation Time: 50 minutes. The patient's level of consciousness and vital signs were monitored continuously by radiology nursing throughout the procedure under my direct supervision. CONTRAST:  65 cc FLUOROSCOPY TIME:  Fluoroscopy Time: 10 minutes 18 seconds (2,075 mGy). COMPLICATIONS: None PROCEDURE: Informed consent was obtained  from the patient following explanation of the procedure, risks, benefits and alternatives. The patient understands, agrees and consents for the procedure. All questions were addressed. A time out was performed prior to the initiation of the procedure. Maximal barrier sterile technique utilized including caps, mask, sterile gowns, sterile gloves, large sterile drape, hand hygiene, and Betadine prep. Ultrasound survey of the right inguinal region was performed with images stored and sent to PACs, confirming patency of the vessel. A micropuncture needle was used access the right common femoral artery under ultrasound. With excellent arterial blood flow returned, and an .018 micro wire was passed through the needle, observed enter the abdominal aorta under fluoroscopy. The needle was removed, and a micropuncture sheath was placed over the wire. The inner dilator and wire were removed, and an 035 Bentson wire was advanced under fluoroscopy into the abdominal aorta. The sheath was removed and a standard 5 [place] vascular sheath was placed. The dilator was removed and the sheath was flushed. C2 Cobra catheter advanced on the Bentson wire into the abdominal aorta. Wire was removed and the catheter was used to engage the SMA. Angiogram was performed. A high-flow Renegade catheter was then advanced with a coaxial 14 fathom wire into the SMA, ileocolic artery. Angiogram was performed. The coaxial catheter wire were then further advanced into the colic branches, targeting a site of observed extravasation of contrast, correlating to the prior CT. Multiple obliquity, angiogram performed for isolation of the branch artery. Once we were confident that the microcatheter was within the branch artery, we attempted to navigate the catheter to a distal position. Given the tortuosity, only a proximal position  within the terminal branch could be achieved. Coil embolization was then performed with a single 3 mm x 4 cm low-profile penumbra  Ruby coil. After deployment of the coil and withdrawal of the catheter angiogram confirmed no further flow or evidence of extravasation at the site. All catheters wires were removed. Exoseal was deployed for hemostasis. IMPRESSION: Status post ultrasound guided access right common femoral artery for mesenteric angiogram including SMA, ileocolic artery, and super selective catheter position into the colic arteries, for isolation of hemorrhagic colic artery branch and coil embolization. Exoseal for hemostasis. Signed, Dulcy Fanny. Dellia Nims, RPVI [facility] Radiology Electronically Signed   By: Corrie Mckusick D.O.   On: 03/13/2021 14:37   IR EMBO ART  VEN HEMORR LYMPH EXTRAV  INC GUIDE ROADMAPPING  Result Date: 03/13/2021 INDICATION: 80 year old female presents with acute lower GI bleeding, isolated to the right colon on recent CT angiogram, with attempt for angiogram and embolization EXAM: ULTRASOUND-GUIDED ACCESS RIGHT COMMON FEMORAL ARTERY MESENTERIC ANGIOGRAM INCLUDING SMA, ILEOCOLIC ARTERY, AND BRANCHES OF THE COLIC ARTERIES COIL EMBOLIZATION COLIC ARTERY EXOSEAL FOR HEMOSTASIS MEDICATIONS: NONE ANESTHESIA/SEDATION: Moderate (conscious) sedation was employed during this procedure. A total of Versed 1.5 mg and Fentanyl 25 mcg was administered intravenously. Moderate Sedation Time: 50 minutes. The patient's level of consciousness and vital signs were monitored continuously by radiology nursing throughout the procedure under my direct supervision. CONTRAST:  65 cc FLUOROSCOPY TIME:  Fluoroscopy Time: 10 minutes 18 seconds (2,075 mGy). COMPLICATIONS: None PROCEDURE: Informed consent was obtained from the patient following explanation of the procedure, risks, benefits and alternatives. The patient understands, agrees and consents for the procedure. All questions were addressed. A time out was performed prior to the initiation of the procedure. Maximal barrier sterile  technique utilized including caps, mask, sterile gowns, sterile gloves, large sterile drape, hand hygiene, and Betadine prep. Ultrasound survey of the right inguinal region was performed with images stored and sent to PACs, confirming patency of the vessel. A micropuncture needle was used access the right common femoral artery under ultrasound. With excellent arterial blood flow returned, and an .018 micro wire was passed through the needle, observed enter the abdominal aorta under fluoroscopy. The needle was removed, and a micropuncture sheath was placed over the wire. The inner dilator and wire were removed, and an 035 Bentson wire was advanced under fluoroscopy into the abdominal aorta. The sheath was removed and a standard 5 [place] vascular sheath was placed. The dilator was removed and the sheath was flushed. C2 Cobra catheter advanced on the Bentson wire into the abdominal aorta. Wire was removed and the catheter was used to engage the SMA. Angiogram was performed. A high-flow Renegade catheter was then advanced with a coaxial 14 fathom wire into the SMA, ileocolic artery. Angiogram was performed. The coaxial catheter wire were then further advanced into the colic branches, targeting a site of observed extravasation of contrast, correlating to the prior CT. Multiple obliquity, angiogram performed for isolation of the branch artery. Once we were confident that the microcatheter was within the branch artery, we attempted to navigate the catheter to a distal position. Given the tortuosity, only a proximal position within the terminal branch could be achieved. Coil embolization was then performed with a single 3 mm x 4 cm low-profile penumbra Ruby coil. After deployment of the coil and withdrawal of the catheter angiogram confirmed no further flow or evidence of extravasation at the site. All catheters wires were removed. Exoseal was deployed for  hemostasis. IMPRESSION: Status post ultrasound guided access right  common femoral artery for mesenteric angiogram including SMA, ileocolic artery, and super selective catheter position into the colic arteries, for isolation of hemorrhagic colic artery branch and coil embolization. Exoseal for hemostasis. Signed, Dulcy Fanny. Dellia Nims, RPVI [facility] Radiology Electronically Signed   By: Corrie Mckusick D.O.   On: 03/13/2021 14:37   CT Angio Abd/Pel w/ and/or w/o  Result Date: 03/13/2021 CLINICAL DATA:  80 year old female with suspected lower GI bleeding. 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 EXAM: CTA ABDOMEN AND PELVIS WITHOUT AND WITH CONTRAST TECHNIQUE: Multidetector CT imaging of the abdomen and pelvis was performed using the standard protocol during bolus administration of intravenous contrast. Multiplanar reconstructed images and MIPs were obtained and reviewed to evaluate the vascular anatomy. CONTRAST:  72mL OMNIPAQUE IOHEXOL 350 MG/ML SOLN COMPARISON:  CT abdomen/pelvis 01/05/2016 FINDINGS: VASCULAR Aorta: Normal caliber aorta without aneurysm, dissection, vasculitis or significant stenosis. Scattered atherosclerotic vascular calcifications. Celiac: Patent without evidence of aneurysm, dissection, vasculitis or significant stenosis. SMA: Patent without evidence of aneurysm, dissection, vasculitis or significant stenosis. Renals: Both renal arteries are patent without evidence of aneurysm, dissection, vasculitis, fibromuscular dysplasia or significant stenosis. IMA: Patent without evidence of aneurysm, dissection, vasculitis or significant stenosis. Inflow: Patent without evidence of aneurysm, dissection, vasculitis or significant stenosis. Proximal Outflow: Bilateral common femoral and visualized portions of the superficial and profunda femoral arteries are patent without evidence of aneurysm, dissection, vasculitis or significant stenosis. Veins: No focal venous  abnormality. Review of the MIP images confirms the above findings. NON-VASCULAR Lower chest: No acute abnormality.  Cardiomegaly. Hepatobiliary: No focal liver abnormality is seen. No gallstones, gallbladder wall thickening, or biliary dilatation. Pancreas: Unremarkable. No pancreatic ductal dilatation or surrounding inflammatory changes. Spleen: Normal in size without focal abnormality. Adrenals/Urinary Tract: Adrenal glands are normal in appearance. No hydronephrosis, nephrolithiasis or enhancing renal mass. Exophytic simple cysts from both kidneys. The ureters and bladder are unremarkable. Stomach/Bowel: Extensive colonic diverticulosis. Extravasation of arterial phase contrast present within a diverticulum arising from the medial aspect of the ascending colon (image 114 series 6) with evidence of blooming of the extravasated contrast on the venous phase images. Findings are consistent with acute diverticular bleed. There is extensive colonic diverticulosis most profound in the sigmoid colon. No evidence of active inflammation to suggest acute diverticulitis. No focal bowel wall thickening or evidence of obstruction. Unremarkable stomach and proximal small bowel. Normal appendix. Lymphatic: No suspicious lymphadenopathy. Reproductive: Uterus and bilateral adnexa are unremarkable. Other: Trace free fluid in the pelvis is abnormal in a post reproductive age female. The fluid is low in attenuation. Musculoskeletal: No acute fracture or aggressive appearing lytic or blastic osseous lesion. Chronic L1 compression fracture with out significant change in height loss. Advanced multilevel degenerative disc disease. Grade 1 anterolisthesis of L5 on S1. Lower lumbar facet arthropathy again noted. IMPRESSION: 1. Positive for active diverticular bleed arising from the proximal ascending colon in the right lower quadrant. 2. Extensive colonic diverticulosis without evidence of active inflammation. 3. Small volume free fluid in  the anatomic pelvis is nonspecific but abnormal for a post reproductive age female. 4. Tortuous and atherosclerotic abdominal aorta. Aortic Atherosclerosis (ICD10-I70.0). 5. Cardiomegaly. 6. Stable chronic L1 compression fracture. 7. Multilevel degenerative disc disease with grade 1 anterolisthesis of L5 on S1. These results were discussed in person at the time of interpretation on 03/13/2021 at 11:48 am to provider Rhiannan Kievit , who verbally acknowledged  these results. Electronically Signed   By: Jacqulynn Cadet M.D.   On: 03/13/2021 11:48      Assessment & Recommendations  80 year old with history of multifactorial anemia related to MDS and CKD, history of GERD, diverticulosis, symptomatic hemorrhoids, hypertension, hypothyroidism and arthritis admitted with diverticular hemorrhage status post coil embolization in the right colon.  Diverticular hemorrhage --status post coil embolization yesterday with IR after positive CT angiography.  Appreciate the help of Dr. Earleen Newport.  Hemoglobin has declined today but there is no clinical evidence of ongoing bleeding and she is feeling better reporting stools that look more normal.  I do expect stool to contain all blood for the next several bowel movements.  Any bright blood or maroon blood would represent rebleeding. --2 units of packed red cells ordered now --Follow-up result of transfusion, monitor hemoglobin --I will advance to soft diet --No plans for colonoscopy at this time given successful embolization --GI will sign off but be available, please call if questions        LOS: 1 day   Lajuan Lines Kyng Matlock  03/14/2021, 10:03 AM [phone]

## 2021-03-15 LAB — CBC
HCT: 30.7 % — ABNORMAL LOW (ref 36.0–46.0)
Hemoglobin: 10.2 g/dL — ABNORMAL LOW (ref 12.0–15.0)
MCH: 27.9 pg (ref 26.0–34.0)
MCHC: 33.2 g/dL (ref 30.0–36.0)
MCV: 84.1 fL (ref 80.0–100.0)
Platelets: 111 10*3/uL — ABNORMAL LOW (ref 150–400)
RBC: 3.65 MIL/uL — ABNORMAL LOW (ref 3.87–5.11)
RDW: 19.3 % — ABNORMAL HIGH (ref 11.5–15.5)
WBC: 7.5 10*3/uL (ref 4.0–10.5)
nRBC: 0 % (ref 0.0–0.2)

## 2021-03-15 LAB — BPAM RBC
Blood Product Expiration Date: 202207142359
Blood Product Expiration Date: 202207142359
ISSUE DATE / TIME: 202206121212
ISSUE DATE / TIME: 202206121612
Unit Type and Rh: 5100
Unit Type and Rh: 5100

## 2021-03-15 LAB — TYPE AND SCREEN
ABO/RH(D): O POS
Antibody Screen: POSITIVE
Donor AG Type: NEGATIVE
Donor AG Type: NEGATIVE
Unit division: 0
Unit division: 0

## 2021-03-15 LAB — BASIC METABOLIC PANEL
Anion gap: 7 (ref 5–15)
BUN: 34 mg/dL — ABNORMAL HIGH (ref 8–23)
CO2: 23 mmol/L (ref 22–32)
Calcium: 9.3 mg/dL (ref 8.9–10.3)
Chloride: 110 mmol/L (ref 98–111)
Creatinine, Ser: 1.47 mg/dL — ABNORMAL HIGH (ref 0.44–1.00)
GFR, Estimated: 36 mL/min — ABNORMAL LOW (ref >60)
Glucose, Bld: 91 mg/dL (ref 70–99)
Potassium: 3.9 mmol/L (ref 3.5–5.1)
Sodium: 140 mmol/L (ref 135–145)

## 2021-03-15 MED ORDER — LABETALOL HCL 100 MG PO TABS
100.0000 mg | ORAL_TABLET | Freq: Two times a day (BID) | ORAL | Status: DC
  Administered 2021-03-15 – 2021-03-16 (×3): 100 mg via ORAL
  Filled 2021-03-15 (×3): qty 1

## 2021-03-15 NOTE — Evaluation (Signed)
Physical Therapy Evaluation Only Patient Details Name: Susan [name] MRN: [id number] DOB: 1941-03-03 Today's Date: 03/15/2021   History of Present Illness  Susan [name] is a 80 y.o. female  who is admitted to [facility] on 03/12/2021 with suspected acute lower GI bleed after presenting from home to [facility] ED complaining of bright red blood per rectum. CT-active diverticular bleed arising from the proximal ascending colon in the right lower quadrant; s/p coil embolization 03/13/21 with IR. 2 units of blood transfused on 6/10 and again on 6/12. PMH: chronic anemia, myelodysplastic syndrome, GERD, HTN, CKD stage IIIb  Clinical Impression  Pt ambulates to restroom without AD, completing toileting without assistance. Pt ambulate in hallway with SPC, good steadiness, no LOB, cadence WFL. Encouraged pt to spend time OOB, ambulate with nursing and pt verbalizes understanding. Pt at baseline, no acute care PT needs identified, will sign off at this time.    Follow Up Recommendations No PT follow up    Equipment Recommendations  None recommended by PT    Recommendations for Other Services       Precautions / Restrictions Precautions Precautions: None Restrictions Weight Bearing Restrictions: No      Mobility  Bed Mobility Overal bed mobility: Modified Independent     Transfers Overall transfer level: Modified independent Equipment used: Straight cane     Ambulation/Gait Ambulation/Gait assistance: Supervision;Modified independent (Device/Increase time) Gait Distance (Feet): 180 Feet Assistive device: Straight cane;None Gait Pattern/deviations: WFL(Within Functional Limits) Gait velocity: WFL   General Gait Details: Pt ambulates around room without SPC, supv for safety; pt ambulates in hallway with SPC, making turns, navigating past obstacles, good steadiness and no LOB  Stairs            Wheelchair Mobility    Modified Rankin (Stroke Patients Only)        Balance Overall balance assessment: No apparent balance deficits (not formally assessed)              Pertinent Vitals/Pain Pain Assessment: No/denies pain    Home Living Family/patient expects to be discharged to:: Private residence Living Arrangements: Alone Available Help at Discharge: Personal care attendant;Available PRN/intermittently Type of Home: Apartment (Seniors only apartments- [place]) Home Access: Level entry     Home Layout: One level Home Equipment: [place] - single point      Prior Function Level of Independence: Independent with assistive device(s)         Comments: Pt reports using SPC for household and community ambulation when gout in ankle flares, independent with ADLs, has personal aide that assists with household chores as needed, denies falls.     Hand Dominance        Extremity/Trunk Assessment   Upper Extremity Assessment Upper Extremity Assessment: Overall WFL for tasks assessed    Lower Extremity Assessment Lower Extremity Assessment: Overall WFL for tasks assessed    Cervical / Trunk Assessment Cervical / Trunk Assessment: Normal  Communication   Communication: No difficulties  Cognition Arousal/Alertness: Awake/alert Behavior During Therapy: WFL for tasks assessed/performed Overall Cognitive Status: Within Functional Limits for tasks assessed           General Comments      Exercises     Assessment/Plan    PT Assessment Patent does not need any further PT services  PT Problem List         PT Treatment Interventions      PT Goals (Current goals can be found in the Care Plan section)  Acute Rehab PT Goals Patient Stated Goal: home once better PT Goal Formulation: All assessment and education complete, DC therapy    Frequency     Barriers to discharge        Co-evaluation               AM-PAC PT "6 Clicks" Mobility  Outcome Measure Help needed turning from your back to your side while in a flat  bed without using bedrails?: None Help needed moving from lying on your back to sitting on the side of a flat bed without using bedrails?: None Help needed moving to and from a bed to a chair (including a wheelchair)?: None Help needed standing up from a chair using your arms (e.g., wheelchair or bedside chair)?: None Help needed to walk in hospital room?: None Help needed climbing 3-5 steps with a railing? : None 6 Click Score: 24    End of Session Equipment Utilized During Treatment: Gait belt Activity Tolerance: Patient tolerated treatment well Patient left: in chair;with call bell/phone within reach Nurse Communication: Mobility status PT Visit Diagnosis: Other abnormalities of gait and mobility (R26.89)    Time: 1330-1346 PT Time Calculation (min) (ACUTE ONLY): 16 min   Charges:   PT Evaluation $PT Eval Low Complexity: 1 Low           Tori Teylor Wolven PT, DPT 03/15/21, 2:17 PM

## 2021-03-15 NOTE — Progress Notes (Signed)
PROGRESS NOTE    Susan [name]  VZC:[id number] DOB: [birth year] DOA: 03/12/2021 PCP: Sonia Side., [name]  Brief Narrative: 80 year old female with history of chronic anemia, MDS, CKD 3 with baseline creatinine of 1.5-1.8 presented to the ED after multiple episodes of bright red blood per rectum x 2 days. -Early in the day of admission she saw Dr. Alen Blew her hematologist for routine follow-up, that day she had an episode of hematochezia, in the office she was noted to have a hemoglobin of 6.2 and subsequently transfused 2 units of PRBC at the cancer center, while at the cancer center she had 2 more episodes of hematochezia and subsequently sent to the ED. she denies any abdominal pain, denies fevers or chills -In the ED her hemoglobin was 9.1, repeat at 8.3,, creatinine was 1.5  Assessment & Plan:   Presumed diverticular bleeding Acute blood loss anemia -Urgent CTA abdomen pelvis 6/11, noted to have active bleeding from diverticulum in the right colon, underwent selective angiography with coil embolization of cecal branch of SMA 6/11 -No further active bleeding, likely old blood overnight -Transfused 2 units of PRBC on 6/10 and 2 units on 6/12  -Prior colonoscopy in 01/2017 with pandiverticulosis and internal hemorrhoids -Discharge planning, PT OT eval  CKD 3a -baseline creatinine 1.5-1.8, creatinine now 1.4, got contrast load for CTA and angiogram -Fortunately creatinine remains stable  Mild Dementia -continue aricept  GERD -Continue PPI  Essential hypertension -Amlodipine on hold, restart labetalol at a lower dose  Hypothyroidism -Continue Synthroid  H/o MDS Chronic anemia -followed by Dr.Shadad  DVT prophylaxis:SCDs Code Status: Full Code Family Communication: No family at bedside, called and updated son yesterday Disposition Plan:  Status is:  inpatient because: Inpatient level of care appropriate due to severity of illness  Dispo: The patient is from: Home               Anticipated d/c is to: Home              Patient currently is not medically stable to d/c.   Difficult to place patient No   Consultants:  [facility]  Procedures:   Antimicrobials: Procedure: Dr.Wagner IR 6/11 [place] guided right CFA access, deployment of exoseal for hemostasis Mesenteric angiogram of the SMA, iliocolic artery, cecal branches. Coil embolization of cecal branch contributing to acute extravasation of contrast   Subjective: -Feels well, had some dark stools overnight, none today  Objective: Vitals:   03/14/21 1926 03/14/21 2127 03/15/21 0500 03/15/21 0529  BP: (!) 141/87 134/73  120/66  Pulse: 77 78  75  Resp: 17 18  16   Temp: (!) 97.5 F (36.4 C) 98.2 F (36.8 C)  98.1 F (36.7 C)  TempSrc: Oral Oral  Oral  SpO2:  97%  99%  Weight:   54.8 kg   Height:        Intake/Output Summary (Last 24 hours) at 03/15/2021 1124 Last data filed at 03/15/2021 0900 Gross per 24 hour  Intake 1484 ml  Output --  Net 1484 ml   Filed Weights   03/13/21 1638 03/14/21 0217 03/15/21 0500  Weight: 63 kg 51 kg 54.8 kg    Examination: Gen: Awake, Alert, Oriented X 3, no distress HEENT: no JVD Lungs: Clear CVS: S1S2/RRR Abd: soft, Non tender, non distended, BS present Extremities: No edema Skin: no new rashes on exposed skin   Data Reviewed:   CBC: Recent Labs  Lab 03/12/21 1032 03/12/21 2016 03/13/21 0121 03/13/21 0500 03/13/21 1842 03/14/21  2706 03/14/21 2117 03/15/21 0505  WBC 7.6 7.9  --  5.3 8.1 6.6  --  7.5  NEUTROABS 5.8 7.2  --   --   --   --   --   --   HGB 6.2* 9.1*   < > 8.3* 8.0* 6.7* 10.1* 10.2*  HCT 19.5* 27.9*   < > 25.5* 24.9* 20.3* 30.9* 30.7*  MCV 75.9* 80.2  --  79.4* 82.2 80.6  --  84.1  PLT 127* 115*  --  101* 115* 109*  --  111*   < > = values in this interval not displayed.   Basic Metabolic Panel: Recent Labs  Lab 03/12/21 1032 03/12/21 2016 03/13/21 0500 03/14/21 0519 03/15/21 0505  NA 144 140 142 141 140  K  3.8 4.6 4.6 4.3 3.9  CL 111 109 112* 113* 110  CO2 24 21* 22 22 23   GLUCOSE 100* 185* 135* 79 91  BUN 41* 44* 43* 40* 34*  CREATININE 1.93* 1.74* 1.58* 1.42* 1.47*  CALCIUM 9.3 9.2 9.2 9.1 9.3  MG  --  2.3 2.3  --   --    GFR: Estimated Creatinine Clearance: 23.7 mL/min (A) (by C-G formula based on SCr of 1.47 mg/dL (H)). Liver Function Tests: Recent Labs  Lab 03/12/21 1032 03/12/21 2016 03/13/21 0500  AST 15 20 15   ALT 11 15 12   ALKPHOS 171* 155* 139*  BILITOT 0.3 0.6 0.4  PROT 7.2 7.8 6.9  ALBUMIN 3.5 4.0 3.6   Recent Labs  Lab 03/12/21 2016  LIPASE 75*   No results for input(s): AMMONIA in the last 168 hours. Coagulation Profile: Recent Labs  Lab 03/12/21 2016 03/13/21 0500  INR 1.2 1.2   Cardiac Enzymes: No results for input(s): CKTOTAL, CKMB, CKMBINDEX, TROPONINI in the last 168 hours. BNP (last 3 results) No results for input(s): PROBNP in the last 8760 hours. HbA1C: No results for input(s): HGBA1C in the last 72 hours. CBG: No results for input(s): GLUCAP in the last 168 hours. Lipid Profile: No results for input(s): CHOL, HDL, LDLCALC, TRIG, CHOLHDL, LDLDIRECT in the last 72 hours. Thyroid Function Tests: No results for input(s): TSH, T4TOTAL, FREET4, T3FREE, THYROIDAB in the last 72 hours. Anemia Panel: No results for input(s): VITAMINB12, FOLATE, FERRITIN, TIBC, IRON, RETICCTPCT in the last 72 hours.  Urine analysis:    Component Value Date/Time   COLORURINE YELLOW 03/13/2021 0429   APPEARANCEUR CLEAR 03/13/2021 0429   LABSPEC 1.018 03/13/2021 0429   PHURINE 5.0 03/13/2021 0429   GLUCOSEU NEGATIVE 03/13/2021 0429   HGBUR NEGATIVE 03/13/2021 0429   BILIRUBINUR NEGATIVE 03/13/2021 0429   KETONESUR NEGATIVE 03/13/2021 0429   PROTEINUR NEGATIVE 03/13/2021 0429   UROBILINOGEN 1.0 07/07/2015 1917   NITRITE NEGATIVE 03/13/2021 0429   LEUKOCYTESUR NEGATIVE 03/13/2021 0429   Sepsis Labs: @LABRCNTIP (procalcitonin:4,lacticidven:4)  ) Recent  Results (from the past 240 hour(s))  SARS CORONAVIRUS 2 (TAT 6-24 HRS) Nasopharyngeal Nasopharyngeal Swab     Status: None   Collection Time: 03/12/21 10:47 PM   Specimen: Nasopharyngeal Swab  Result Value Ref Range Status   SARS Coronavirus 2 NEGATIVE NEGATIVE Final    Comment: (NOTE) SARS-CoV-2 target nucleic acids are NOT DETECTED.  The SARS-CoV-2 RNA is generally detectable in upper and lower respiratory specimens during the acute phase of infection. Negative results do not preclude SARS-CoV-2 infection, do not rule out co-infections with other pathogens, and should not be used as the sole basis for treatment or other patient management decisions. Negative results  must be combined with clinical observations, patient history, and epidemiological information. The expected result is Negative.  Fact Sheet for Patients: SugarRoll.be  Fact Sheet for Healthcare Providers: https://www.woods-mathews.com/  This test is not yet approved or cleared by the [place] FDA and  has been authorized for detection and/or diagnosis of SARS-CoV-2 by FDA under an Emergency Use Authorization (EUA). This EUA will remain  in effect (meaning this test can be used) for the duration of the COVID-19 declaration under Se ction 564(b)(1) of the Act, 21 U.S.C. section 360bbb-3(b)(1), unless the authorization is terminated or revoked sooner.  Performed at [facility], [place] [address], [place], [place] [address]     Scheduled Meds:  allopurinol  300 mg Oral Daily   donepezil  10 mg Oral QHS   fluticasone  1 spray Each Nare Daily   levothyroxine  75 mcg Oral Q0600   memantine  10 mg Oral Daily   pantoprazole  40 mg Oral Q1200   Continuous Infusions:   LOS: 2 days    Time spent: 80min  Domenic Polite, MD [facility]   03/15/2021, 11:24 AM

## 2021-03-16 LAB — CBC
HCT: 30 % — ABNORMAL LOW (ref 36.0–46.0)
Hemoglobin: 9.8 g/dL — ABNORMAL LOW (ref 12.0–15.0)
MCH: 27.7 pg (ref 26.0–34.0)
MCHC: 32.7 g/dL (ref 30.0–36.0)
MCV: 84.7 fL (ref 80.0–100.0)
Platelets: 112 10*3/uL — ABNORMAL LOW (ref 150–400)
RBC: 3.54 MIL/uL — ABNORMAL LOW (ref 3.87–5.11)
RDW: 19.5 % — ABNORMAL HIGH (ref 11.5–15.5)
WBC: 7.8 10*3/uL (ref 4.0–10.5)
nRBC: 0 % (ref 0.0–0.2)

## 2021-03-16 LAB — BASIC METABOLIC PANEL
Anion gap: 6 (ref 5–15)
BUN: 29 mg/dL — ABNORMAL HIGH (ref 8–23)
CO2: 25 mmol/L (ref 22–32)
Calcium: 8.9 mg/dL (ref 8.9–10.3)
Chloride: 111 mmol/L (ref 98–111)
Creatinine, Ser: 1.58 mg/dL — ABNORMAL HIGH (ref 0.44–1.00)
GFR, Estimated: 33 mL/min — ABNORMAL LOW (ref >60)
Glucose, Bld: 96 mg/dL (ref 70–99)
Potassium: 3.8 mmol/L (ref 3.5–5.1)
Sodium: 142 mmol/L (ref 135–145)

## 2021-03-16 NOTE — Discharge Summary (Signed)
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 -Early in the day of admission she saw Dr. Alen Blew her hematologist for routine follow-up, that day she had an episode of hematochezia, in the office she was noted to have a hemoglobin of 6.2 and subsequently transfused 2 units of PRBC at the cancer center, while at the cancer center she had 2 more episodes of hematochezia and subsequently sent to the ED. she denies any abdominal pain, denies fevers or chills -In the ED her hemoglobin was 9.1, repeat at 8.3,, creatinine was 1.5    Hospital Course:   Acute diverticular bleeding Acute blood loss anemia -Admitted with multiple episodes of hematochezia, hemoglobin was 6.2 on the day of admission, urgent CTA abdomen pelvis 6/11, noted to have active bleeding from diverticulum in the right colon, underwent selective angiography with coil embolization of cecal branch of SMA on 6/11 by Dr. Earleen Newport (IR) -No further active bleeding -Transfused 2 units of PRBC on 6/10 and 2 units on 6/12  -Prior colonoscopy in 01/2017 with  pandiverticulosis and internal hemorrhoids -Hemoglobin improved and stable now, no further bleeding -Discharged home in a stable condition, advised to avoid NSAIDs   CKD 3a -baseline creatinine 1.5-1.8,  got contrast load for CTA and angiogram -Fortunately creatinine remains stable, creatinine 1.5 at discharge   Mild Dementia -continue aricept   GERD -Continue PPI   Essential hypertension -Amlodipine and labetalol resumed   Hypothyroidism -Continue Synthroid   H/o MDS Chronic anemia -followed by Dr.Shadad  Discharge Exam: Vitals:   03/15/21 2131 03/16/21 0626  BP: 133/70 129/73  Pulse: 66   Resp: 16   Temp: 98.2 F (36.8 C) 98.6 F (37 C)  SpO2: 97% 98%    General: AAOx3 Cardiovascular: S1S2/RRR Respiratory: CTAB  Discharge Instructions   Discharge Instructions     Diet - low sodium heart healthy   Complete by: As directed    Discharge wound care:   Complete by: As directed    routine   Increase activity slowly   Complete by: As directed       Allergies as of 03/16/2021       Reactions   Codeine Anaphylaxis   Penicillins Anaphylaxis   Bactrim Itching, Swelling   Clarithromycin Other (See Comments)   "Caused skin to peel off my hand."   Flagyl [metronidazole Hcl] Itching, Swelling   Food Other (See Comments)   EATS WHITE RICE ONLY (ALLERGIC REACTION TO YELLOW OR BROWN RICE THAT RESULTED IN HOSPITALIZATION)   Pineapple Itching, Swelling   SWELLING OF EYES   Sulfa Antibiotics Itching, Swelling  Medication List     STOP taking these medications    diphenhydrAMINE 25 mg capsule Commonly known as: BENADRYL   meloxicam 7.5 MG tablet Commonly known as: MOBIC   naproxen sodium 220 MG tablet Commonly known as: ALEVE       TAKE these medications    acetaminophen 500 MG tablet Commonly known as: TYLENOL Take 500-1,000 mg by mouth every 6 (six) hours as needed (for pain.).   allopurinol 300 MG tablet Commonly known as:  ZYLOPRIM Take 300 mg by mouth daily.   aspirin 81 MG chewable tablet Chew 81 mg by mouth daily.   donepezil 10 MG tablet Commonly known as: ARICEPT Take 10 mg by mouth at bedtime.   famotidine 20 MG tablet Commonly known as: PEPCID Take 20 mg by mouth 2 (two) times daily.   labetalol 200 MG tablet Commonly known as: NORMODYNE Take 200 mg by mouth 2 (two) times daily.   levothyroxine 75 MCG tablet Commonly known as: SYNTHROID Take 75 mcg by mouth every morning.   memantine 10 MG tablet Commonly known as: Namenda Take 1 tablet (10 mg total) by mouth 2 (two) times daily.   mirtazapine 15 MG tablet Commonly known as: REMERON Take 15 mg by mouth at bedtime.   montelukast 10 MG tablet Commonly known as: SINGULAIR Take 10 mg by mouth at bedtime.   pantoprazole 40 MG tablet Commonly known as: PROTONIX Take 1 tablet (40 mg total) by mouth 2 (two) times daily before a meal.       ASK your doctor about these medications    amLODipine 10 MG tablet Commonly known as: NORVASC TAKE 1/2 TABLET BY MOUTH DAILY AT BEDTIME; (TAKE 1 TABLET IF SBP IS >150)               Discharge Care Instructions  (From admission, onward)           Start     Ordered   03/16/21 0000  Discharge wound care:       Comments: routine   03/16/21 1025           Allergies  Allergen Reactions   Codeine Anaphylaxis   Penicillins Anaphylaxis   Bactrim Itching and Swelling   Clarithromycin Other (See Comments)    "Caused skin to peel off my hand."   Flagyl [Metronidazole Hcl] Itching and Swelling   Food Other (See Comments)    EATS WHITE RICE ONLY (ALLERGIC REACTION TO YELLOW OR BROWN RICE THAT RESULTED IN HOSPITALIZATION)   Pineapple Itching and Swelling    SWELLING OF EYES   Sulfa Antibiotics Itching and Swelling      The results of significant diagnostics from this hospitalization (including imaging, microbiology, ancillary and laboratory) are listed below for reference.     Significant Diagnostic Studies: IR Angiogram Visceral Selective  Result Date: 03/13/2021 INDICATION: 80 year old female presents with acute lower GI bleeding, isolated to the right colon on recent CT angiogram, with attempt for angiogram and embolization EXAM: ULTRASOUND-GUIDED ACCESS RIGHT COMMON FEMORAL ARTERY MESENTERIC ANGIOGRAM INCLUDING SMA, ILEOCOLIC ARTERY, AND BRANCHES OF THE COLIC ARTERIES COIL EMBOLIZATION COLIC ARTERY EXOSEAL FOR HEMOSTASIS MEDICATIONS: NONE ANESTHESIA/SEDATION: Moderate (conscious) sedation was employed during this procedure. A total of Versed 1.5 mg and Fentanyl 25 mcg was administered intravenously. Moderate Sedation Time: 50 minutes. The patient's level of consciousness and vital signs were monitored continuously by radiology nursing throughout the procedure under my direct supervision. CONTRAST:  65 cc FLUOROSCOPY TIME:  Fluoroscopy Time: 10 minutes 18 seconds (  2,075 mGy). COMPLICATIONS: None PROCEDURE: Informed consent was obtained from the patient following explanation of the procedure, risks, benefits and alternatives. The patient understands, agrees and consents for the procedure. All questions were addressed. A time out was performed prior to the initiation of the procedure. Maximal barrier sterile technique utilized including caps, mask, sterile gowns, sterile gloves, large sterile drape, hand hygiene, and Betadine prep. Ultrasound survey of the right inguinal region was performed with images stored and sent to PACs, confirming patency of the vessel. A micropuncture needle was used access the right common femoral artery under ultrasound. With excellent arterial blood flow returned, and an .018 micro wire was passed through the needle, observed enter the abdominal aorta under fluoroscopy. The needle was removed, and a micropuncture sheath was placed over the wire. The inner dilator and wire were removed, and an 035 Bentson wire was advanced under fluoroscopy into the  abdominal aorta. The sheath was removed and a standard 5 [place] vascular sheath was placed. The dilator was removed and the sheath was flushed. C2 Cobra catheter advanced on the Bentson wire into the abdominal aorta. Wire was removed and the catheter was used to engage the SMA. Angiogram was performed. A high-flow Renegade catheter was then advanced with a coaxial 14 fathom wire into the SMA, ileocolic artery. Angiogram was performed. The coaxial catheter wire were then further advanced into the colic branches, targeting a site of observed extravasation of contrast, correlating to the prior CT. Multiple obliquity, angiogram performed for isolation of the branch artery. Once we were confident that the microcatheter was within the branch artery, we attempted to navigate the catheter to a distal position. Given the tortuosity, only a proximal position within the terminal branch could be achieved. Coil embolization was then performed with a single 3 mm x 4 cm low-profile penumbra Ruby coil. After deployment of the coil and withdrawal of the catheter angiogram confirmed no further flow or evidence of extravasation at the site. All catheters wires were removed. Exoseal was deployed for hemostasis. IMPRESSION: Status post ultrasound guided access right common femoral artery for mesenteric angiogram including SMA, ileocolic artery, and super selective catheter position into the colic arteries, for isolation of hemorrhagic colic artery branch and coil embolization. Exoseal for hemostasis. Signed, Dulcy Fanny. Dellia Nims, RPVI [facility] Radiology Electronically Signed   By: Corrie Mckusick D.O.   On: 03/13/2021 14:37   IR Angiogram Follow Up Study  Result Date: 03/13/2021 INDICATION: 80 year old female presents with acute lower GI bleeding, isolated to the right colon on recent CT angiogram, with attempt for angiogram and embolization EXAM: ULTRASOUND-GUIDED ACCESS RIGHT COMMON  FEMORAL ARTERY MESENTERIC ANGIOGRAM INCLUDING SMA, ILEOCOLIC ARTERY, AND BRANCHES OF THE COLIC ARTERIES COIL EMBOLIZATION COLIC ARTERY EXOSEAL FOR HEMOSTASIS MEDICATIONS: NONE ANESTHESIA/SEDATION: Moderate (conscious) sedation was employed during this procedure. A total of Versed 1.5 mg and Fentanyl 25 mcg was administered intravenously. Moderate Sedation Time: 50 minutes. The patient's level of consciousness and vital signs were monitored continuously by radiology nursing throughout the procedure under my direct supervision. CONTRAST:  65 cc FLUOROSCOPY TIME:  Fluoroscopy Time: 10 minutes 18 seconds (2,075 mGy). COMPLICATIONS: None PROCEDURE: Informed consent was obtained from the patient following explanation of the procedure, risks, benefits and alternatives. The patient understands, agrees and consents for the procedure. All questions were addressed. A time out was performed prior to the initiation of the procedure. Maximal barrier sterile technique utilized including caps, mask, sterile gowns, sterile gloves, large sterile drape, hand  hygiene, and Betadine prep. Ultrasound survey of the right inguinal region was performed with images stored and sent to PACs, confirming patency of the vessel. A micropuncture needle was used access the right common femoral artery under ultrasound. With excellent arterial blood flow returned, and an .018 micro wire was passed through the needle, observed enter the abdominal aorta under fluoroscopy. The needle was removed, and a micropuncture sheath was placed over the wire. The inner dilator and wire were removed, and an 035 Bentson wire was advanced under fluoroscopy into the abdominal aorta. The sheath was removed and a standard 5 [place] vascular sheath was placed. The dilator was removed and the sheath was flushed. C2 Cobra catheter advanced on the Bentson wire into the abdominal aorta. Wire was removed and the catheter was used to engage the SMA. Angiogram was performed. A  high-flow Renegade catheter was then advanced with a coaxial 14 fathom wire into the SMA, ileocolic artery. Angiogram was performed. The coaxial catheter wire were then further advanced into the colic branches, targeting a site of observed extravasation of contrast, correlating to the prior CT. Multiple obliquity, angiogram performed for isolation of the branch artery. Once we were confident that the microcatheter was within the branch artery, we attempted to navigate the catheter to a distal position. Given the tortuosity, only a proximal position within the terminal branch could be achieved. Coil embolization was then performed with a single 3 mm x 4 cm low-profile penumbra Ruby coil. After deployment of the coil and withdrawal of the catheter angiogram confirmed no further flow or evidence of extravasation at the site. All catheters wires were removed. Exoseal was deployed for hemostasis. IMPRESSION: Status post ultrasound guided access right common femoral artery for mesenteric angiogram including SMA, ileocolic artery, and super selective catheter position into the colic arteries, for isolation of hemorrhagic colic artery branch and coil embolization. Exoseal for hemostasis. Signed, Dulcy Fanny. Dellia Nims, RPVI [facility] Radiology Electronically Signed   By: Corrie Mckusick D.O.   On: 03/13/2021 14:37   IR US Guide Vasc Access Right  Result Date: 03/13/2021 INDICATION: 80 year old female presents with acute lower GI bleeding, isolated to the right colon on recent CT angiogram, with attempt for angiogram and embolization EXAM: ULTRASOUND-GUIDED ACCESS RIGHT COMMON FEMORAL ARTERY MESENTERIC ANGIOGRAM INCLUDING SMA, ILEOCOLIC ARTERY, AND BRANCHES OF THE COLIC ARTERIES COIL EMBOLIZATION COLIC ARTERY EXOSEAL FOR HEMOSTASIS MEDICATIONS: NONE ANESTHESIA/SEDATION: Moderate (conscious) sedation was employed during this procedure. A total of Versed 1.5 mg and Fentanyl 25  mcg was administered intravenously. Moderate Sedation Time: 50 minutes. The patient's level of consciousness and vital signs were monitored continuously by radiology nursing throughout the procedure under my direct supervision. CONTRAST:  65 cc FLUOROSCOPY TIME:  Fluoroscopy Time: 10 minutes 18 seconds (2,075 mGy). COMPLICATIONS: None PROCEDURE: Informed consent was obtained from the patient following explanation of the procedure, risks, benefits and alternatives. The patient understands, agrees and consents for the procedure. All questions were addressed. A time out was performed prior to the initiation of the procedure. Maximal barrier sterile technique utilized including caps, mask, sterile gowns, sterile gloves, large sterile drape, hand hygiene, and Betadine prep. Ultrasound survey of the right inguinal region was performed with images stored and sent to PACs, confirming patency of the vessel. A micropuncture needle was used access the right common femoral artery under ultrasound. With excellent arterial blood flow returned, and an .018 micro wire was passed through the needle, observed enter the abdominal aorta under fluoroscopy.  The needle was removed, and a micropuncture sheath was placed over the wire. The inner dilator and wire were removed, and an 035 Bentson wire was advanced under fluoroscopy into the abdominal aorta. The sheath was removed and a standard 5 [place] vascular sheath was placed. The dilator was removed and the sheath was flushed. C2 Cobra catheter advanced on the Bentson wire into the abdominal aorta. Wire was removed and the catheter was used to engage the SMA. Angiogram was performed. A high-flow Renegade catheter was then advanced with a coaxial 14 fathom wire into the SMA, ileocolic artery. Angiogram was performed. The coaxial catheter wire were then further advanced into the colic branches, targeting a site of observed extravasation of contrast, correlating to the prior CT. Multiple  obliquity, angiogram performed for isolation of the branch artery. Once we were confident that the microcatheter was within the branch artery, we attempted to navigate the catheter to a distal position. Given the tortuosity, only a proximal position within the terminal branch could be achieved. Coil embolization was then performed with a single 3 mm x 4 cm low-profile penumbra Ruby coil. After deployment of the coil and withdrawal of the catheter angiogram confirmed no further flow or evidence of extravasation at the site. All catheters wires were removed. Exoseal was deployed for hemostasis. IMPRESSION: Status post ultrasound guided access right common femoral artery for mesenteric angiogram including SMA, ileocolic artery, and super selective catheter position into the colic arteries, for isolation of hemorrhagic colic artery branch and coil embolization. Exoseal for hemostasis. Signed, Dulcy Fanny. Dellia Nims, RPVI [facility] Radiology Electronically Signed   By: Corrie Mckusick D.O.   On: 03/13/2021 14:37   IR EMBO ART  VEN HEMORR LYMPH EXTRAV  INC GUIDE ROADMAPPING  Result Date: 03/13/2021 INDICATION: 80 year old female presents with acute lower GI bleeding, isolated to the right colon on recent CT angiogram, with attempt for angiogram and embolization EXAM: ULTRASOUND-GUIDED ACCESS RIGHT COMMON FEMORAL ARTERY MESENTERIC ANGIOGRAM INCLUDING SMA, ILEOCOLIC ARTERY, AND BRANCHES OF THE COLIC ARTERIES COIL EMBOLIZATION COLIC ARTERY EXOSEAL FOR HEMOSTASIS MEDICATIONS: NONE ANESTHESIA/SEDATION: Moderate (conscious) sedation was employed during this procedure. A total of Versed 1.5 mg and Fentanyl 25 mcg was administered intravenously. Moderate Sedation Time: 50 minutes. The patient's level of consciousness and vital signs were monitored continuously by radiology nursing throughout the procedure under my direct supervision. CONTRAST:  65 cc FLUOROSCOPY TIME:  Fluoroscopy  Time: 10 minutes 18 seconds (2,075 mGy). COMPLICATIONS: None PROCEDURE: Informed consent was obtained from the patient following explanation of the procedure, risks, benefits and alternatives. The patient understands, agrees and consents for the procedure. All questions were addressed. A time out was performed prior to the initiation of the procedure. Maximal barrier sterile technique utilized including caps, mask, sterile gowns, sterile gloves, large sterile drape, hand hygiene, and Betadine prep. Ultrasound survey of the right inguinal region was performed with images stored and sent to PACs, confirming patency of the vessel. A micropuncture needle was used access the right common femoral artery under ultrasound. With excellent arterial blood flow returned, and an .018 micro wire was passed through the needle, observed enter the abdominal aorta under fluoroscopy. The needle was removed, and a micropuncture sheath was placed over the wire. The inner dilator and wire were removed, and an 035 Bentson wire was advanced under fluoroscopy into the abdominal aorta. The sheath was removed and a standard 5 [place] vascular sheath was placed. The dilator was removed and the sheath was flushed.  C2 Cobra catheter advanced on the Bentson wire into the abdominal aorta. Wire was removed and the catheter was used to engage the SMA. Angiogram was performed. A high-flow Renegade catheter was then advanced with a coaxial 14 fathom wire into the SMA, ileocolic artery. Angiogram was performed. The coaxial catheter wire were then further advanced into the colic branches, targeting a site of observed extravasation of contrast, correlating to the prior CT. Multiple obliquity, angiogram performed for isolation of the branch artery. Once we were confident that the microcatheter was within the branch artery, we attempted to navigate the catheter to a distal position. Given the tortuosity, only a proximal position within the terminal branch  could be achieved. Coil embolization was then performed with a single 3 mm x 4 cm low-profile penumbra Ruby coil. After deployment of the coil and withdrawal of the catheter angiogram confirmed no further flow or evidence of extravasation at the site. All catheters wires were removed. Exoseal was deployed for hemostasis. IMPRESSION: Status post ultrasound guided access right common femoral artery for mesenteric angiogram including SMA, ileocolic artery, and super selective catheter position into the colic arteries, for isolation of hemorrhagic colic artery branch and coil embolization. Exoseal for hemostasis. Signed, Dulcy Fanny. Dellia Nims, RPVI [facility] Radiology Electronically Signed   By: Corrie Mckusick D.O.   On: 03/13/2021 14:37   CT Angio Abd/Pel w/ and/or w/o  Result Date: 03/13/2021 CLINICAL DATA:  80 year old female with suspected lower GI bleeding. 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 EXAM: CTA ABDOMEN AND PELVIS WITHOUT AND WITH CONTRAST TECHNIQUE: Multidetector CT imaging of the abdomen and pelvis was performed using the standard protocol during bolus administration of intravenous contrast. Multiplanar reconstructed images and MIPs were obtained and reviewed to evaluate the vascular anatomy. CONTRAST:  30mL OMNIPAQUE IOHEXOL 350 MG/ML SOLN COMPARISON:  CT abdomen/pelvis 01/05/2016 FINDINGS: VASCULAR Aorta: Normal caliber aorta without aneurysm, dissection, vasculitis or significant stenosis. Scattered atherosclerotic vascular calcifications. Celiac: Patent without evidence of aneurysm, dissection, vasculitis or significant stenosis. SMA: Patent without evidence of aneurysm, dissection, vasculitis or significant stenosis. Renals: Both renal arteries are patent without evidence of aneurysm, dissection, vasculitis, fibromuscular dysplasia or significant stenosis. IMA: Patent without evidence of aneurysm,  dissection, vasculitis or significant stenosis. Inflow: Patent without evidence of aneurysm, dissection, vasculitis or significant stenosis. Proximal Outflow: Bilateral common femoral and visualized portions of the superficial and profunda femoral arteries are patent without evidence of aneurysm, dissection, vasculitis or significant stenosis. Veins: No focal venous abnormality. Review of the MIP images confirms the above findings. NON-VASCULAR Lower chest: No acute abnormality.  Cardiomegaly. Hepatobiliary: No focal liver abnormality is seen. No gallstones, gallbladder wall thickening, or biliary dilatation. Pancreas: Unremarkable. No pancreatic ductal dilatation or surrounding inflammatory changes. Spleen: Normal in size without focal abnormality. Adrenals/Urinary Tract: Adrenal glands are normal in appearance. No hydronephrosis, nephrolithiasis or enhancing renal mass. Exophytic simple cysts from both kidneys. The ureters and bladder are unremarkable. Stomach/Bowel: Extensive colonic diverticulosis. Extravasation of arterial phase contrast present within a diverticulum arising from the medial aspect of the ascending colon (image 114 series 6) with evidence of blooming of the extravasated contrast on the venous phase images. Findings are consistent with acute diverticular bleed. There is extensive colonic diverticulosis most profound in the sigmoid colon. No evidence of active inflammation to suggest acute diverticulitis. No focal bowel wall thickening or evidence of obstruction. Unremarkable stomach and proximal small bowel. Normal appendix. Lymphatic: No suspicious lymphadenopathy. Reproductive: Uterus and  bilateral adnexa are unremarkable. Other: Trace free fluid in the pelvis is abnormal in a post reproductive age female. The fluid is low in attenuation. Musculoskeletal: No acute fracture or aggressive appearing lytic or blastic osseous lesion. Chronic L1 compression fracture with out significant change in  height loss. Advanced multilevel degenerative disc disease. Grade 1 anterolisthesis of L5 on S1. Lower lumbar facet arthropathy again noted. IMPRESSION: 1. Positive for active diverticular bleed arising from the proximal ascending colon in the right lower quadrant. 2. Extensive colonic diverticulosis without evidence of active inflammation. 3. Small volume free fluid in the anatomic pelvis is nonspecific but abnormal for a post reproductive age female. 4. Tortuous and atherosclerotic abdominal aorta. Aortic Atherosclerosis (ICD10-I70.0). 5. Cardiomegaly. 6. Stable chronic L1 compression fracture. 7. Multilevel degenerative disc disease with grade 1 anterolisthesis of L5 on S1. These results were discussed in person at the time of interpretation on 03/13/2021 at 11:48 am to provider JAY PYRTLE , who verbally acknowledged these results. Electronically Signed   By: Jacqulynn Cadet M.D.   On: 03/13/2021 11:48    Microbiology: Recent Results (from the past 240 hour(s))  SARS CORONAVIRUS 2 (TAT 6-24 HRS) Nasopharyngeal Nasopharyngeal Swab     Status: None   Collection Time: 03/12/21 10:47 PM   Specimen: Nasopharyngeal Swab  Result Value Ref Range Status   SARS Coronavirus 2 NEGATIVE NEGATIVE Final    Comment: (NOTE) SARS-CoV-2 target nucleic acids are NOT DETECTED.  The SARS-CoV-2 RNA is generally detectable in upper and lower respiratory specimens during the acute phase of infection. Negative results do not preclude SARS-CoV-2 infection, do not rule out co-infections with other pathogens, and should not be used as the sole basis for treatment or other patient management decisions. Negative results must be combined with clinical observations, patient history, and epidemiological information. The expected result is Negative.  Fact Sheet for Patients: SugarRoll.be  Fact Sheet for Healthcare Providers: https://www.woods-mathews.com/  This test is not yet  approved or cleared by the [place] FDA and  has been authorized for detection and/or diagnosis of SARS-CoV-2 by FDA under an Emergency Use Authorization (EUA). This EUA will remain  in effect (meaning this test can be used) for the duration of the COVID-19 declaration under Se ction 564(b)(1) of the Act, 21 U.S.C. section 360bbb-3(b)(1), unless the authorization is terminated or revoked sooner.  Performed at [facility], [address], [place], [place] [address]      Labs: Basic Metabolic Panel: Recent Labs  Lab 03/12/21 2016 03/13/21 0500 03/14/21 0519 03/15/21 0505 03/16/21 0450  NA 140 142 141 140 142  K 4.6 4.6 4.3 3.9 3.8  CL 109 112* 113* 110 111  CO2 21* 22 22 23 25   GLUCOSE 185* 135* 79 91 96  BUN 44* 43* 40* 34* 29*  CREATININE 1.74* 1.58* 1.42* 1.47* 1.58*  CALCIUM 9.2 9.2 9.1 9.3 8.9  MG 2.3 2.3  --   --   --    Liver Function Tests: Recent Labs  Lab 03/12/21 1032 03/12/21 2016 03/13/21 0500  AST 15 20 15   ALT 11 15 12   ALKPHOS 171* 155* 139*  BILITOT 0.3 0.6 0.4  PROT 7.2 7.8 6.9  ALBUMIN 3.5 4.0 3.6   Recent Labs  Lab 03/12/21 2016  LIPASE 75*   No results for input(s): AMMONIA in the last 168 hours. CBC: Recent Labs  Lab 03/12/21 1032 03/12/21 1032 03/12/21 2016 03/13/21 0121 03/13/21 0500 03/13/21 1842 03/14/21 0519 03/14/21 2117 03/15/21 0505 03/16/21  0450  WBC 7.6   < > 7.9  --  5.3 8.1 6.6  --  7.5 7.8  NEUTROABS 5.8  --  7.2  --   --   --   --   --   --   --   HGB 6.2*  --  9.1*   < > 8.3* 8.0* 6.7* 10.1* 10.2* 9.8*  HCT 19.5*  --  27.9*   < > 25.5* 24.9* 20.3* 30.9* 30.7* 30.0*  MCV 75.9*  --  80.2  --  79.4* 82.2 80.6  --  84.1 84.7  PLT 127*   < > 115*  --  101* 115* 109*  --  111* 112*   < > = values in this interval not displayed.   Cardiac Enzymes: No results for input(s): CKTOTAL, CKMB, CKMBINDEX, TROPONINI in the last 168 hours. BNP: BNP (last 3 results) No results for input(s): BNP in the last 8760  hours.  ProBNP (last 3 results) No results for input(s): PROBNP in the last 8760 hours.  CBG: No results for input(s): GLUCAP in the last 168 hours.     Signed:  Domenic Polite MD.  [facility] 03/16/2021, 11:21 AM

## 2021-03-16 NOTE — Evaluation (Signed)
Occupational Therapy Evaluation Patient Details Name: Susan [name] MRN: [id number] DOB: 19-Aug-1941 Today's Date: 03/16/2021    History of Present Illness Susan [name] is a 80 y.o. female  who is admitted to [facility] on 03/12/2021 with suspected acute lower GI bleed after presenting from home to [facility] ED complaining of bright red blood per rectum. CT-active diverticular bleed arising from the proximal ascending colon in the right lower quadrant; s/p coil embolization 03/13/21 with IR. 2 units of blood transfused on 6/10 and again on 6/12. PMH: chronic anemia, myelodysplastic syndrome, GERD, HTN, CKD stage IIIb   Clinical Impression   Patient lives alone in a senior apartment complex, no steps to enter. Patient is independent with ADL tasks, has friends/caregiver that assist with IADL tasks such as cleaning, paying bills. Patient uses a cane for ambulation as needed. Currently patient is at her baseline, able to perform LB dressing, toilet transfer, peri care, sink side hand hygiene all without any physical assistance. No further acute OT needs, will sign off. Please re-consult if new needs arise.    Follow Up Recommendations  No OT follow up    Equipment Recommendations  None recommended by OT       Precautions / Restrictions Precautions Precautions: None Restrictions Weight Bearing Restrictions: No      Mobility Bed Mobility Overal bed mobility: Modified Independent                  Transfers Overall transfer level: Modified independent Equipment used: Straight cane                  Balance Overall balance assessment: No apparent balance deficits (not formally assessed)                                         ADL either performed or assessed with clinical judgement   ADL Overall ADL's : Independent                                       General ADL Comments: patient is able to perform LB dressing, functional  ambulation with cane, transfer on/off toilet and perform sink side hand hygiene without any assistance      Pertinent Vitals/Pain Pain Assessment: No/denies pain     Hand Dominance Right   Extremity/Trunk Assessment Upper Extremity Assessment Upper Extremity Assessment: Overall WFL for tasks assessed   Lower Extremity Assessment Lower Extremity Assessment: Defer to PT evaluation   Cervical / Trunk Assessment Cervical / Trunk Assessment: Normal   Communication Communication Communication: No difficulties   Cognition Arousal/Alertness: Awake/alert Behavior During Therapy: WFL for tasks assessed/performed Overall Cognitive Status: Within Functional Limits for tasks assessed                                                Home Living Family/patient expects to be discharged to:: Private residence Living Arrangements: Alone Available Help at Discharge: Friend(s);Personal care attendant;Available PRN/intermittently Type of Home: Apartment (senior living apartments- [place]) Home Access: Level entry     Home Layout: One level     Bathroom Shower/Tub: Teacher, early years/pre: Handicapped height  Home Equipment: Kasandra Knudsen - single point;Shower seat;Walker - 2 wheels;Walker - 4 wheels;Grab bars - tub/shower;Wheelchair - manual          Prior Functioning/Environment Level of Independence: Independent with assistive device(s)        Comments: Pt reports using SPC for household and community ambulation when gout in ankle flares, independent with ADLs, has personal aide that assists with household chores as needed, denies falls.        OT Problem List: Decreased activity tolerance         OT Goals(Current goals can be found in the care plan section) Acute Rehab OT Goals Patient Stated Goal: home once better OT Goal Formulation: All assessment and education complete, DC therapy      AM-PAC OT "6 Clicks" Daily Activity     Outcome  Measure Help from another person eating meals?: None Help from another person taking care of personal grooming?: None Help from another person toileting, which includes using toliet, bedpan, or urinal?: None Help from another person bathing (including washing, rinsing, drying)?: None Help from another person to put on and taking off regular upper body clothing?: None Help from another person to put on and taking off regular lower body clothing?: None 6 Click Score: 24   End of Session Equipment Utilized During Treatment: Other (comment) (cane) Nurse Communication: Mobility status  Activity Tolerance: Patient tolerated treatment well Patient left: in chair;with call bell/phone within reach  OT Visit Diagnosis: Other abnormalities of gait and mobility (R26.89)                Time: 2376-2831 OT Time Calculation (min): 13 min Charges:  OT General Charges $OT Visit: 1 Visit OT Evaluation $OT Eval Low Complexity: 1 Low  Delbert Phenix OT OT pager: [place] 03/16/2021, 9:33 AM

## 2021-03-16 NOTE — Progress Notes (Signed)
Patient ready for discharge, discharged order placed by MD. VSS. Discharge instructions given and explained to patient and she endorses no additional questions at this time. IV and Tele removed.  Patient transported downstairs via wheelchair to Son.

## 2021-04-09 ENCOUNTER — Inpatient Hospital Stay: Payer: Medicare Other | Attending: Oncology

## 2021-04-09 ENCOUNTER — Inpatient Hospital Stay: Payer: Medicare Other

## 2021-04-16 ENCOUNTER — Telehealth: Payer: Self-pay

## 2021-04-16 NOTE — Telephone Encounter (Signed)
Called Pt to advise her that Dr. Alen Blew recommends that she move forward with her Lab & Injection appointment set for 8/12. Patient verbalized understanding.

## 2021-04-16 NOTE — Telephone Encounter (Signed)
-----   Message from Wyatt Portela, MD sent at 04/16/2021 11:42 AM EDT ----- Yes. Thanks ----- Message ----- From: Burna Mortimer, CMA Sent: 04/16/2021  11:40 AM EDT To: Wyatt Portela, MD  Dr. Alen Blew,  Pt was admitted on 03/12/21 for lower GI Bleed. Pt is calling today requesting to know if you would still like her to proceed with her lab and Aranesp injection on 8/12. Please advise.  Thank you, Myriam Jacobson

## 2021-05-03 ENCOUNTER — Encounter (HOSPITAL_COMMUNITY): Payer: Self-pay

## 2021-05-03 ENCOUNTER — Other Ambulatory Visit: Payer: Self-pay

## 2021-05-03 ENCOUNTER — Emergency Department (HOSPITAL_COMMUNITY): Payer: Medicare Other

## 2021-05-03 ENCOUNTER — Inpatient Hospital Stay (HOSPITAL_COMMUNITY)
Admission: EM | Admit: 2021-05-03 | Discharge: 2021-05-06 | DRG: 378 | Disposition: A | Discharge status: Home / Self Care | Payer: Medicare Other | Attending: Internal Medicine | Admitting: Internal Medicine

## 2021-05-03 DIAGNOSIS — R55 Syncope and collapse: Secondary | ICD-10-CM | POA: Diagnosis present

## 2021-05-03 DIAGNOSIS — K449 Diaphragmatic hernia without obstruction or gangrene: Secondary | ICD-10-CM | POA: Diagnosis present

## 2021-05-03 DIAGNOSIS — R300 Dysuria: Secondary | ICD-10-CM | POA: Diagnosis present

## 2021-05-03 DIAGNOSIS — D631 Anemia in chronic kidney disease: Secondary | ICD-10-CM | POA: Diagnosis present

## 2021-05-03 DIAGNOSIS — I129 Hypertensive chronic kidney disease with stage 1 through stage 4 chronic kidney disease, or unspecified chronic kidney disease: Secondary | ICD-10-CM | POA: Diagnosis not present

## 2021-05-03 DIAGNOSIS — I10 Essential (primary) hypertension: Secondary | ICD-10-CM | POA: Diagnosis not present

## 2021-05-03 DIAGNOSIS — Z79890 Hormone replacement therapy (postmenopausal): Secondary | ICD-10-CM

## 2021-05-03 DIAGNOSIS — D696 Thrombocytopenia, unspecified: Secondary | ICD-10-CM | POA: Diagnosis present

## 2021-05-03 DIAGNOSIS — K2970 Gastritis, unspecified, without bleeding: Secondary | ICD-10-CM

## 2021-05-03 DIAGNOSIS — Z7982 Long term (current) use of aspirin: Secondary | ICD-10-CM | POA: Diagnosis not present

## 2021-05-03 DIAGNOSIS — D649 Anemia, unspecified: Secondary | ICD-10-CM

## 2021-05-03 DIAGNOSIS — N1832 Chronic kidney disease, stage 3b: Secondary | ICD-10-CM | POA: Diagnosis present

## 2021-05-03 DIAGNOSIS — K922 Gastrointestinal hemorrhage, unspecified: Secondary | ICD-10-CM | POA: Diagnosis present

## 2021-05-03 DIAGNOSIS — Z8249 Family history of ischemic heart disease and other diseases of the circulatory system: Secondary | ICD-10-CM

## 2021-05-03 DIAGNOSIS — M1990 Unspecified osteoarthritis, unspecified site: Secondary | ICD-10-CM | POA: Diagnosis present

## 2021-05-03 DIAGNOSIS — N179 Acute kidney failure, unspecified: Secondary | ICD-10-CM | POA: Diagnosis not present

## 2021-05-03 DIAGNOSIS — D6959 Other secondary thrombocytopenia: Secondary | ICD-10-CM | POA: Diagnosis present

## 2021-05-03 DIAGNOSIS — Z20822 Contact with and (suspected) exposure to covid-19: Secondary | ICD-10-CM | POA: Diagnosis not present

## 2021-05-03 DIAGNOSIS — K5520 Angiodysplasia of colon without hemorrhage: Secondary | ICD-10-CM | POA: Diagnosis present

## 2021-05-03 DIAGNOSIS — D62 Acute posthemorrhagic anemia: Secondary | ICD-10-CM | POA: Diagnosis not present

## 2021-05-03 DIAGNOSIS — D469 Myelodysplastic syndrome, unspecified: Secondary | ICD-10-CM | POA: Diagnosis not present

## 2021-05-03 DIAGNOSIS — E039 Hypothyroidism, unspecified: Secondary | ICD-10-CM | POA: Diagnosis not present

## 2021-05-03 DIAGNOSIS — E861 Hypovolemia: Secondary | ICD-10-CM | POA: Diagnosis present

## 2021-05-03 DIAGNOSIS — F0390 Unspecified dementia without behavioral disturbance: Secondary | ICD-10-CM | POA: Diagnosis present

## 2021-05-03 DIAGNOSIS — K921 Melena: Secondary | ICD-10-CM

## 2021-05-03 DIAGNOSIS — I440 Atrioventricular block, first degree: Secondary | ICD-10-CM | POA: Diagnosis present

## 2021-05-03 DIAGNOSIS — B3781 Candidal esophagitis: Secondary | ICD-10-CM | POA: Diagnosis not present

## 2021-05-03 DIAGNOSIS — Z8052 Family history of malignant neoplasm of bladder: Secondary | ICD-10-CM | POA: Diagnosis not present

## 2021-05-03 DIAGNOSIS — Z87891 Personal history of nicotine dependence: Secondary | ICD-10-CM

## 2021-05-03 DIAGNOSIS — Z823 Family history of stroke: Secondary | ICD-10-CM

## 2021-05-03 DIAGNOSIS — K2951 Unspecified chronic gastritis with bleeding: Secondary | ICD-10-CM | POA: Diagnosis not present

## 2021-05-03 DIAGNOSIS — Z79899 Other long term (current) drug therapy: Secondary | ICD-10-CM

## 2021-05-03 DIAGNOSIS — E785 Hyperlipidemia, unspecified: Secondary | ICD-10-CM | POA: Diagnosis present

## 2021-05-03 DIAGNOSIS — K219 Gastro-esophageal reflux disease without esophagitis: Secondary | ICD-10-CM | POA: Diagnosis present

## 2021-05-03 LAB — HEPATIC FUNCTION PANEL
ALT: 9 U/L (ref 0–44)
AST: 18 U/L (ref 15–41)
Albumin: 3.8 g/dL (ref 3.5–5.0)
Alkaline Phosphatase: 100 U/L (ref 38–126)
Bilirubin, Direct: 0.1 mg/dL (ref 0.0–0.2)
Indirect Bilirubin: 0.3 mg/dL (ref 0.3–0.9)
Total Bilirubin: 0.4 mg/dL (ref 0.3–1.2)
Total Protein: 7.1 g/dL (ref 6.5–8.1)

## 2021-05-03 LAB — PREPARE RBC (CROSSMATCH)

## 2021-05-03 LAB — CBC WITH DIFFERENTIAL/PLATELET
Abs Immature Granulocytes: 0.02 10*3/uL (ref 0.00–0.07)
Basophils Absolute: 0 10*3/uL (ref 0.0–0.1)
Basophils Relative: 0 %
Eosinophils Absolute: 0 10*3/uL (ref 0.0–0.5)
Eosinophils Relative: 1 %
HCT: 18 % — ABNORMAL LOW (ref 36.0–46.0)
Hemoglobin: 5.8 g/dL — CL (ref 12.0–15.0)
Immature Granulocytes: 0 %
Lymphocytes Relative: 27 %
Lymphs Abs: 1.6 10*3/uL (ref 0.7–4.0)
MCH: 26.6 pg (ref 26.0–34.0)
MCHC: 32.2 g/dL (ref 30.0–36.0)
MCV: 82.6 fL (ref 80.0–100.0)
Monocytes Absolute: 0.3 10*3/uL (ref 0.1–1.0)
Monocytes Relative: 5 %
Neutro Abs: 4 10*3/uL (ref 1.7–7.7)
Neutrophils Relative %: 67 %
Platelets: 92 10*3/uL — ABNORMAL LOW (ref 150–400)
RBC: 2.18 MIL/uL — ABNORMAL LOW (ref 3.87–5.11)
RDW: 18.6 % — ABNORMAL HIGH (ref 11.5–15.5)
WBC: 6 10*3/uL (ref 4.0–10.5)
nRBC: 0 % (ref 0.0–0.2)

## 2021-05-03 LAB — BASIC METABOLIC PANEL
Anion gap: 6 (ref 5–15)
BUN: 43 mg/dL — ABNORMAL HIGH (ref 8–23)
CO2: 22 mmol/L (ref 22–32)
Calcium: 9.4 mg/dL (ref 8.9–10.3)
Chloride: 111 mmol/L (ref 98–111)
Creatinine, Ser: 2.46 mg/dL — ABNORMAL HIGH (ref 0.44–1.00)
GFR, Estimated: 19 mL/min — ABNORMAL LOW (ref >60)
Glucose, Bld: 99 mg/dL (ref 70–99)
Potassium: 3.9 mmol/L (ref 3.5–5.1)
Sodium: 139 mmol/L (ref 135–145)

## 2021-05-03 LAB — RESP PANEL BY RT-PCR (FLU A&B, COVID) ARPGX2
Influenza A by PCR: NEGATIVE
Influenza B by PCR: NEGATIVE
SARS Coronavirus 2 by RT PCR: NEGATIVE

## 2021-05-03 LAB — POC OCCULT BLOOD, ED: Fecal Occult Bld: POSITIVE — AB

## 2021-05-03 MED ORDER — SODIUM CHLORIDE 0.9 % IV SOLN: INTRAVENOUS | Status: AC

## 2021-05-03 MED ORDER — ONDANSETRON HCL 4 MG PO TABS: 4.0000 mg | ORAL_TABLET | Freq: Four times a day (QID) | ORAL | Status: DC | PRN

## 2021-05-03 MED ORDER — SODIUM CHLORIDE 0.9% FLUSH
3.0000 mL | Freq: Two times a day (BID) | INTRAVENOUS | Status: DC
  Administered 2021-05-04 – 2021-05-06 (×4): 3 mL via INTRAVENOUS

## 2021-05-03 MED ORDER — MEMANTINE HCL 10 MG PO TABS
10.0000 mg | ORAL_TABLET | Freq: Two times a day (BID) | ORAL | Status: DC
  Administered 2021-05-03 – 2021-05-06 (×6): 10 mg via ORAL
  Filled 2021-05-03: qty 1
  Filled 2021-05-03: qty 2
  Filled 2021-05-03 (×4): qty 1

## 2021-05-03 MED ORDER — SODIUM CHLORIDE 0.9% IV SOLUTION: Freq: Once | INTRAVENOUS | Status: DC

## 2021-05-03 MED ORDER — PANTOPRAZOLE SODIUM 40 MG IV SOLR: 40.0000 mg | Freq: Two times a day (BID) | INTRAVENOUS | Status: DC

## 2021-05-03 MED ORDER — ONDANSETRON HCL 4 MG/2ML IJ SOLN: 4.0000 mg | Freq: Four times a day (QID) | INTRAMUSCULAR | Status: DC | PRN

## 2021-05-03 MED ORDER — MIRTAZAPINE 15 MG PO TABS
15.0000 mg | ORAL_TABLET | Freq: Every day | ORAL | Status: DC
  Administered 2021-05-03 – 2021-05-05 (×3): 15 mg via ORAL
  Filled 2021-05-03: qty 1
  Filled 2021-05-03: qty 2
  Filled 2021-05-03: qty 1

## 2021-05-03 MED ORDER — LEVOTHYROXINE SODIUM 50 MCG PO TABS
75.0000 ug | ORAL_TABLET | Freq: Every day | ORAL | Status: DC
  Administered 2021-05-04 – 2021-05-06 (×3): 75 ug via ORAL
  Filled 2021-05-03 (×4): qty 1

## 2021-05-03 MED ORDER — DONEPEZIL HCL 10 MG PO TABS
10.0000 mg | ORAL_TABLET | Freq: Every day | ORAL | Status: DC
  Administered 2021-05-04 – 2021-05-06 (×3): 10 mg via ORAL
  Filled 2021-05-03 (×3): qty 1

## 2021-05-03 MED ORDER — PANTOPRAZOLE SODIUM 40 MG IV SOLR: 40.0000 mg | Freq: Once | INTRAVENOUS | Status: DC

## 2021-05-03 MED ORDER — SODIUM CHLORIDE 0.9 % IV SOLN: 10.0000 mL/h | Freq: Once | INTRAVENOUS | Status: DC

## 2021-05-03 MED ORDER — PANTOPRAZOLE INFUSION (NEW) - SIMPLE MED
8.0000 mg/h | INTRAVENOUS | Status: DC
  Administered 2021-05-03 – 2021-05-04 (×4): 8 mg/h via INTRAVENOUS
  Filled 2021-05-03 (×2): qty 80
  Filled 2021-05-03: qty 100
  Filled 2021-05-03: qty 80

## 2021-05-03 MED ORDER — PANTOPRAZOLE 80MG IVPB - SIMPLE MED
80.0000 mg | Freq: Once | INTRAVENOUS | Status: DC
Start: 2021-05-03 — End: 2021-05-04
  Filled 2021-05-03: qty 100

## 2021-05-03 NOTE — ED Notes (Signed)
PureWick placed on patient. 

## 2021-05-03 NOTE — H&P (Addendum)
History and Physical    Susan [name] TOI:[id number] DOB: 23-Sep-1941 DOA: 05/03/2021  PCP: Sonia Side., [name]   Patient coming from: Home   Chief Complaint: Black stool with clots, passed out   HPI: Susan [name] is a 80 y.o. female with medical history significant for myelodysplasia, hypertension, hypothyroidism, and memory loss, now presenting to the emergency department reporting black stool with clots and a syncopal episode yesterday.  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  She reports no bleeding until 04/30/2021 when she noted her stool was black.  She has continued to have black stool, also some clots, and had a syncopal episode yesterday when she was getting dressed for church.  She denies any abdominal pain or denies nausea or vomiting.  She takes a baby aspirin but no other NSAID and has been taking twice daily Protonix as well as Pepcid.  EGD in April with esophagitis and gastritis.  Colonoscopy in 2018 with pan diverticulosis and internal hemorrhoids.  She lives alone. Her son in [place] talks to her daily and wants her to move to [place].   ED Course: Upon arrival to the ED, patient is found to be afebrile, saturating well on room air, and with stable blood pressure.  EKG features sinus rhythm with first-degree AV nodal block.  Head CT negative for acute intracranial abnormality.  Chemistry panel notable for BUN of 43 and creatinine 2.46.  CBC with hemoglobin 5.8 and platelets 92,000.  Fecal occult blood testing is positive.  Patient was started on IV Protonix in the emergency department, 2 units of RBC were ordered for transfusion, GI was consulted by the ED  physician, and hospitalists asked to admit.  Review of Systems:  All other systems reviewed and apart from HPI, are negative.  Past Medical History:  Diagnosis Date   Anemia    Arthritis    Blood transfusion without reported diagnosis    GERD (gastroesophageal reflux disease)    HLD (hyperlipidemia)    Hypertension    Hypothyroidism    Memory loss    Myelodysplasia    Vitamin D deficiency     Past Surgical History:  Procedure Laterality Date   BIOPSY  01/09/2021   Procedure: BIOPSY;  Surgeon: Milus Banister, MD;  Location: [facility] ENDOSCOPY;  Service: Endoscopy;;   CARPAL TUNNEL RELEASE Right    DILATION AND CURETTAGE OF UTERUS     ESOPHAGOGASTRODUODENOSCOPY (EGD) WITH PROPOFOL N/A 01/09/2021   Procedure: ESOPHAGOGASTRODUODENOSCOPY (EGD) WITH PROPOFOL;  Surgeon: Milus Banister, MD;  Location: [facility] ENDOSCOPY;  Service: Endoscopy;  Laterality: N/A;   INGUINAL HERNIA REPAIR     IR ANGIOGRAM FOLLOW UP STUDY  03/13/2021   IR ANGIOGRAM VISCERAL SELECTIVE  03/13/2021   IR EMBO ART  VEN HEMORR LYMPH EXTRAV  INC GUIDE ROADMAPPING  03/13/2021   IR US GUIDE VASC ACCESS RIGHT  03/13/2021   ROTATOR CUFF REPAIR Left    TRANSANAL HEMORRHOIDAL DEARTERIALIZATION N/A 03/31/2017   Procedure: TRANSANAL HEMORRHOIDAL DEARTERIALIZATION;  Surgeon: Leighton Ruff, MD;  Location: [facility] ORS;  Service: General;  Laterality: N/A;   UMBILICAL HERNIA REPAIR      Social History:   reports that she quit smoking about  24 years ago. Her smoking use included cigarettes. She has never used smokeless tobacco. She reports current alcohol use. She reports that she does not use drugs.  Allergies  Allergen Reactions   Codeine Anaphylaxis   Penicillins Anaphylaxis   Bactrim Itching and Swelling   Clarithromycin Other (See Comments)    "Caused skin to peel off my hand."   Flagyl [Metronidazole Hcl] Itching and Swelling   Food Other (See Comments)    EATS WHITE RICE ONLY (ALLERGIC REACTION TO YELLOW OR BROWN RICE THAT  RESULTED IN HOSPITALIZATION)   Pineapple Itching and Swelling    SWELLING OF EYES   Sulfa Antibiotics Itching and Swelling    Family History  Problem Relation Age of Onset   Stroke Father    Hypertension Father    Other Mother        died at early age   Stroke Sister    Coronary artery disease Brother    Hypertension Sister    Bladder Cancer Sister    Colon cancer Neg Hx    Stomach cancer Neg Hx      Prior to Admission medications   Medication Sig Start Date End Date Taking? Authorizing Provider  acetaminophen (TYLENOL) 500 MG tablet Take 500 mg by mouth every 6 (six) hours as needed for moderate pain (for pain.).   Yes [provider]  allopurinol (ZYLOPRIM) 300 MG tablet Take 300 mg by mouth daily. 07/20/20   [provider]  amLODipine (NORVASC) 10 MG tablet TAKE 1/2 TABLET BY MOUTH DAILY AT BEDTIME; (TAKE 1 TABLET IF SBP IS >150) Patient taking differently: Take 10 mg by mouth at bedtime. 07/20/17   [name], NP  aspirin 81 MG chewable tablet Chew 81 mg by mouth daily.    [provider]  donepezil (ARICEPT) 10 MG tablet Take 10 mg by mouth at bedtime. 07/20/20   [provider]  famotidine (PEPCID) 20 MG tablet Take 20 mg by mouth 2 (two) times daily. 03/08/21   [provider]  labetalol (NORMODYNE) 200 MG tablet Take 200 mg by mouth 2 (two) times daily. 06/23/20   [provider]  levothyroxine (SYNTHROID) 75 MCG tablet Take 75 mcg by mouth every morning. 12/18/20   [provider]  memantine (NAMENDA) 10 MG tablet Take 1 tablet (10 mg total) by mouth 2 (two) times daily. 11/30/20   [name], NP  mirtazapine (REMERON) 15 MG tablet Take 15 mg by mouth at bedtime. 07/20/20   [provider]  montelukast (SINGULAIR) 10 MG tablet Take 10 mg by mouth at bedtime.    [provider]  pantoprazole (PROTONIX) 40 MG tablet Take 1 tablet (40 mg total) by mouth 2 (two) times daily before a meal.  01/10/21   Little Ishikawa, MD    Physical Exam: Vitals:   05/03/21 1752 05/03/21 1800 05/03/21 1900  BP: 118/68  115/67  Pulse: 80  72  Resp: 18  20  Temp: 98.6 F (37 C)    TempSrc: Oral    SpO2: 100%  97%  Weight:  62.6 kg   Height:  5\' 2"  (1.575 m)     Constitutional: NAD, calm  Eyes: PERTLA, lids and conjunctivae normal ENMT: Mucous membranes are moist. Posterior pharynx clear of any exudate or lesions.   Neck: supple, no masses  Respiratory: clear to auscultation bilaterally, no wheezing, no crackles.   Cardiovascular: S1 & S2 heard, regular rate and rhythm. No extremity edema.  Abdomen: No distension,  no tenderness, soft. Bowel sounds active.  Musculoskeletal: no clubbing / cyanosis. No joint deformity upper and lower extremities.   Skin: no significant rashes, lesions, ulcers. Warm, dry, well-perfused. Neurologic: CN 2-12 grossly intact. Sensation intact. Moving all extremities.  Psychiatric: Alert and oriented to person, place, and situation. Very pleasant and cooperative.    Labs and Imaging on Admission: I have personally reviewed following labs and imaging studies  CBC: Recent Labs  Lab 05/03/21 1751  WBC 6.0  NEUTROABS 4.0  HGB 5.8*  HCT 18.0*  MCV 82.6  PLT 92*   Basic Metabolic Panel: Recent Labs  Lab 05/03/21 1751  NA 139  K 3.9  CL 111  CO2 22  GLUCOSE 99  BUN 43*  CREATININE 2.46*  CALCIUM 9.4   GFR: Estimated Creatinine Clearance: 15.6 mL/min (A) (by C-G formula based on SCr of 2.46 mg/dL (H)). Liver Function Tests: Recent Labs  Lab 05/03/21 1751  AST 18  ALT 9  ALKPHOS 100  BILITOT 0.4  PROT 7.1  ALBUMIN 3.8   No results for input(s): LIPASE, AMYLASE in the last 168 hours. No results for input(s): AMMONIA in the last 168 hours. Coagulation Profile: No results for input(s): INR, PROTIME in the last 168 hours. Cardiac Enzymes: No results for input(s): CKTOTAL, CKMB, CKMBINDEX, TROPONINI in the last 168 hours. BNP (last 3  results) No results for input(s): PROBNP in the last 8760 hours. HbA1C: No results for input(s): HGBA1C in the last 72 hours. CBG: No results for input(s): GLUCAP in the last 168 hours. Lipid Profile: No results for input(s): CHOL, HDL, LDLCALC, TRIG, CHOLHDL, LDLDIRECT in the last 72 hours. Thyroid Function Tests: No results for input(s): TSH, T4TOTAL, FREET4, T3FREE, THYROIDAB in the last 72 hours. Anemia Panel: No results for input(s): VITAMINB12, FOLATE, FERRITIN, TIBC, IRON, RETICCTPCT in the last 72 hours. Urine analysis:    Component Value Date/Time   COLORURINE YELLOW 03/13/2021 0429   APPEARANCEUR CLEAR 03/13/2021 0429   LABSPEC 1.018 03/13/2021 0429   PHURINE 5.0 03/13/2021 0429   GLUCOSEU NEGATIVE 03/13/2021 0429   HGBUR NEGATIVE 03/13/2021 0429   BILIRUBINUR NEGATIVE 03/13/2021 0429   KETONESUR NEGATIVE 03/13/2021 0429   PROTEINUR NEGATIVE 03/13/2021 0429   UROBILINOGEN 1.0 07/07/2015 1917   NITRITE NEGATIVE 03/13/2021 0429   LEUKOCYTESUR NEGATIVE 03/13/2021 0429   Sepsis Labs: @LABRCNTIP (procalcitonin:4,lacticidven:4) )No results found for this or any previous visit (from the past 240 hour(s)).   Radiological Exams on Admission: CT HEAD WO CONTRAST (5MM)  Result Date: 05/03/2021 CLINICAL DATA:  Fall, syncope, dizziness, weakness EXAM: CT HEAD WITHOUT CONTRAST TECHNIQUE: Contiguous axial images were obtained from the base of the skull through the vertex without intravenous contrast. COMPARISON:  03/08/2016, 06/04/2020 FINDINGS: Brain: No evidence of acute infarction, hemorrhage, hydrocephalus, extra-axial collection or mass lesion/mass effect. Extensive low-density changes within the periventricular and subcortical white matter compatible with chronic microvascular ischemic change. Mild diffuse cerebral volume loss. Vascular: Atherosclerotic calcifications involving the large vessels of the skull base. No unexpected hyperdense vessel. Skull: Normal. Negative for fracture  or focal lesion. Sinuses/Orbits: No acute finding. Other: Negative for scalp hematoma. IMPRESSION: 1. No acute intracranial findings. 2. Chronic microvascular ischemic change and cerebral volume loss. Electronically Signed   By: Davina Poke D.O.   On: 05/03/2021 18:52    EKG: Independently reviewed. Sinus rhythm, 1st degree AV block.   Assessment/Plan   1. Acute GI bleeding; symptomatic anemia  - Pt reports black stool but also clots, has known severe diverticulosis, had  esophagitis & gastritis on EGD in April 2022  - Hgb is 5.8 on admission, down from 9.8 six wks ago  - ED reports melena on DRE  - IV PPI was started in ED and 2 units RBC were ordered for transfusion   - Keep NPO, continue IV PPI, check serial CBC, follow-up GI recommendations    2. Syncope   - She had lightheadedness when standing and syncopal episode yesterday in setting of GIB  - SR with no arrhythmia, normal rate, no high-grade blocks on EKG  - Likely related to GIB/hypovolemia  - Transfuse and hydrate with IVF, continue cardiac monitoring    3. AKI superimposed on CKD IIIb  - SCr is 2.46 on admission, up from baseline closer to 1.4 or 1.5  - Likely acute prerenal azotemia in setting of GIB  - Transfuse as above, start IVF hydration, renally-dose medications, monitor   4. MDS; thrombocytopenia  - Platelets 92k on admission, Hgb down to 5.8 in setting GIB  - Keep platelets >50,000 while bleeding   5. Hypertension  - Treat as-needed only for now    6. Hypothyroidism - Continue Synthroid    7. Dementia   - Delirium precautions   ADDENDUM: Complained of dysuria am after admission. No systemic signs of infection. Plan to check UA and culture.    DVT prophylaxis: SCDs  Code Status: Full  Level of Care: Level of care: Telemetry Family Communication: Son updated by phone  Disposition Plan:  Patient is from: Home  Anticipated d/c is to: TBD Anticipated d/c date is: 05/05/21 Patient currently: Pending  blood transfusion, GI consultation, stable H&H  Consults called: GI consulted by ED physician  Admission status: Inpatient     Vianne Bulls, MD [facility]  05/03/2021, 7:58 PM

## 2021-05-03 NOTE — ED Triage Notes (Signed)
[facility] reports pt coming from home c/o rectal bleeding that has been going on since Friday. States it got worse today with passing large clots. Pt had a syncopal episode yesterday. Pt c/o dizziness and weakness in her legs.

## 2021-05-03 NOTE — ED Provider Notes (Signed)
[facility] DEPT Provider Note   CSN: [id number] Arrival date & time: 05/03/21  1727     History Chief Complaint  Patient presents with   Rectal Bleeding    Susan [name] is a 80 y.o. female.  The history is provided by the patient.  Rectal Bleeding Quality:  Black and tarry Amount:  Moderate Duration:  4 days Timing:  Intermittent Chronicity:  New Context: spontaneously   Similar prior episodes: yes   Worsened by:  Nothing Ineffective treatments:  None tried Associated symptoms: dizziness and loss of consciousness (?)   Associated symptoms: no abdominal pain, no fever and no vomiting       Past Medical History:  Diagnosis Date   Anemia    Arthritis    Blood transfusion without reported diagnosis    GERD (gastroesophageal reflux disease)    HLD (hyperlipidemia)    Hypertension    Hypothyroidism    Memory loss    Myelodysplasia    Vitamin D deficiency     Patient Active Problem List   Diagnosis Date Noted   Acute on chronic anemia 03/13/2021   Allergic rhinitis 03/13/2021   Acute lower GI bleeding 03/12/2021   Anemia 01/08/2021   Cerebral vascular disease 06/09/2020   Pain due to onychomycosis of toenails of both feet 04/08/2020   Porokeratosis 04/08/2020   Dementia without behavioral disturbance ([place]) 02/27/2020   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   BMI 29.0-29.9,adult 09/02/2015   Encounter for Medicare annual wellness exam 06/02/2015   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[place]) 11/03/2014   CKD, 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) 11/02/2014   Medication management 01/27/2014   Hyperlipidemia 08/27/2013   Essential hypertension    Hypothyroidism    GERD (gastroesophageal reflux disease)    Vitamin D deficiency    Hemorrhoids 07/15/2013    Past Surgical History:  Procedure Laterality Date   BIOPSY  01/09/2021   Procedure: BIOPSY;  Surgeon: Milus Banister, MD;  Location: [facility];  Service: Endoscopy;;   CARPAL TUNNEL RELEASE  Right    DILATION AND CURETTAGE OF UTERUS     ESOPHAGOGASTRODUODENOSCOPY (EGD) WITH PROPOFOL N/A 01/09/2021   Procedure: ESOPHAGOGASTRODUODENOSCOPY (EGD) WITH PROPOFOL;  Surgeon: Milus Banister, MD;  Location: [facility];  Service: Endoscopy;  Laterality: N/A;   INGUINAL HERNIA REPAIR     IR ANGIOGRAM FOLLOW UP STUDY  03/13/2021   IR ANGIOGRAM VISCERAL SELECTIVE  03/13/2021   IR EMBO ART  VEN HEMORR LYMPH EXTRAV  INC GUIDE ROADMAPPING  03/13/2021   IR US GUIDE VASC ACCESS RIGHT  03/13/2021   ROTATOR CUFF REPAIR Left    TRANSANAL HEMORRHOIDAL DEARTERIALIZATION N/A 03/31/2017   Procedure: TRANSANAL HEMORRHOIDAL DEARTERIALIZATION;  Surgeon: Leighton Ruff, MD;  Location: [facility];  Service: General;  Laterality: N/A;   UMBILICAL HERNIA REPAIR       OB History   No obstetric history on file.     Family History  Problem Relation Age of Onset   Stroke Father    Hypertension Father    Other Mother        died at early age   Stroke Sister    Coronary artery disease Brother    Hypertension Sister    Bladder Cancer Sister    Colon cancer Neg Hx    Stomach cancer Neg Hx     Social History   Tobacco Use   Smoking status: Former    Types: Cigarettes    Quit date: 10/02/1996    Years  since quitting: 24.6   Smokeless tobacco: Never  Vaping Use   Vaping Use: Never used  Substance Use Topics   Alcohol use: Yes    Comment: glass of wine at least once a month.   Drug use: No    Home Medications Prior to Admission medications   Medication Sig Start Date End Date Taking? Authorizing Provider  acetaminophen (TYLENOL) 500 MG tablet Take 500-1,000 mg by mouth every 6 (six) hours as needed (for pain.).    [provider]  allopurinol (ZYLOPRIM) 300 MG tablet Take 300 mg by mouth daily. 07/20/20   [provider]  amLODipine (NORVASC) 10 MG tablet TAKE 1/2 TABLET BY MOUTH DAILY AT BEDTIME; (TAKE 1 TABLET IF SBP IS >150) Patient taking differently: Take 10 mg by mouth at  bedtime. 07/20/17   [name], NP  aspirin 81 MG chewable tablet Chew 81 mg by mouth daily.    [provider]  donepezil (ARICEPT) 10 MG tablet Take 10 mg by mouth at bedtime. 07/20/20   [provider]  famotidine (PEPCID) 20 MG tablet Take 20 mg by mouth 2 (two) times daily. 03/08/21   [provider]  labetalol (NORMODYNE) 200 MG tablet Take 200 mg by mouth 2 (two) times daily. 06/23/20   [provider]  levothyroxine (SYNTHROID) 75 MCG tablet Take 75 mcg by mouth every morning. 12/18/20   [provider]  memantine (NAMENDA) 10 MG tablet Take 1 tablet (10 mg total) by mouth 2 (two) times daily. 11/30/20   [name], NP  mirtazapine (REMERON) 15 MG tablet Take 15 mg by mouth at bedtime. 07/20/20   [provider]  montelukast (SINGULAIR) 10 MG tablet Take 10 mg by mouth at bedtime.    [provider]  pantoprazole (PROTONIX) 40 MG tablet Take 1 tablet (40 mg total) by mouth 2 (two) times daily before a meal. 01/10/21   Little Ishikawa, MD    Allergies    Codeine, Penicillins, Bactrim, Clarithromycin, Flagyl [metronidazole hcl], Food, Pineapple, and Sulfa antibiotics  Review of Systems   Review of Systems  Constitutional:  Negative for chills and fever.  HENT:  Negative for ear pain and sore throat.   Eyes:  Negative for pain and visual disturbance.  Respiratory:  Negative for cough and shortness of breath.   Cardiovascular:  Negative for chest pain and palpitations.  Gastrointestinal:  Positive for blood in stool and hematochezia. Negative for abdominal pain, constipation, diarrhea, nausea and vomiting.  Genitourinary:  Negative for dysuria and hematuria.  Musculoskeletal:  Negative for arthralgias and back pain.  Skin:  Negative for color change and rash.  Neurological:  Positive for dizziness and loss of consciousness (?). Negative for seizures and syncope.  All other systems reviewed and are  negative.  Physical Exam Updated Vital Signs  ED Triage Vitals  Enc Vitals Group     BP 05/03/21 1752 118/68     Pulse Rate 05/03/21 1752 80     Resp 05/03/21 1752 18     Temp 05/03/21 1752 98.6 F (37 C)     Temp Source 05/03/21 1752 Oral     SpO2 05/03/21 1752 100 %     Weight 05/03/21 1800 138 lb (62.6 kg)     Height 05/03/21 1800 5\' 2"  (1.575 m)     Head Circumference --      Peak Flow --      Pain Score 05/03/21 1752 0     Pain Loc --  Pain Edu? --      Excl. in [place]? --     Physical Exam Vitals and nursing note reviewed.  Constitutional:      General: She is not in acute distress.    Appearance: She is well-developed.  HENT:     Head: Normocephalic and atraumatic.     Nose: Nose normal.     Mouth/Throat:     Mouth: Mucous membranes are moist.  Eyes:     Conjunctiva/sclera: Conjunctivae normal.     Pupils: Pupils are equal, round, and reactive to light.  Cardiovascular:     Rate and Rhythm: Normal rate and regular rhythm.     Heart sounds: No murmur heard. Pulmonary:     Effort: Pulmonary effort is normal. No respiratory distress.     Breath sounds: Normal breath sounds.  Abdominal:     Palpations: Abdomen is soft.     Tenderness: There is no abdominal tenderness.  Genitourinary:    Rectum: Guaiac result positive (black stool on exam).  Musculoskeletal:     Cervical back: Neck supple.  Skin:    General: Skin is warm and dry.     Capillary Refill: Capillary refill takes less than 2 seconds.  Neurological:     General: No focal deficit present.     Mental Status: She is alert.  Psychiatric:        Mood and Affect: Mood normal.    ED Results / Procedures / Treatments   Labs (all labs ordered are listed, but only abnormal results are displayed) Labs Reviewed  CBC WITH DIFFERENTIAL/PLATELET - Abnormal; Notable for the following components:      Result Value   RBC 2.18 (*)    Hemoglobin 5.8 (*)    HCT 18.0 (*)    RDW 18.6 (*)    Platelets 92 (*)     All other components within normal limits  POC OCCULT BLOOD, ED - Abnormal; Notable for the following components:   Fecal Occult Bld POSITIVE (*)    All other components within normal limits  RESP PANEL BY RT-PCR (FLU A&B, COVID) ARPGX2  BASIC METABOLIC PANEL  HEPATIC FUNCTION PANEL  TYPE AND SCREEN  PREPARE RBC (CROSSMATCH)    EKG EKG Interpretation  Date/Time:  Monday May 03 2021 18:35:28 EDT Ventricular Rate:  71 PR Interval:  244 QRS Duration: 93 QT Interval:  405 QTC Calculation: 441 R Axis:   25 Text Interpretation: Sinus rhythm Prolonged PR interval Confirmed by Lennice Sites ([id number]) on 05/03/2021 6:44:43 PM  Radiology CT HEAD WO CONTRAST (5MM)  Result Date: 05/03/2021 CLINICAL DATA:  Fall, syncope, dizziness, weakness EXAM: CT HEAD WITHOUT CONTRAST TECHNIQUE: Contiguous axial images were obtained from the base of the skull through the vertex without intravenous contrast. COMPARISON:  03/08/2016, 06/04/2020 FINDINGS: Brain: No evidence of acute infarction, hemorrhage, hydrocephalus, extra-axial collection or mass lesion/mass effect. Extensive low-density changes within the periventricular and subcortical white matter compatible with chronic microvascular ischemic change. Mild diffuse cerebral volume loss. Vascular: Atherosclerotic calcifications involving the large vessels of the skull base. No unexpected hyperdense vessel. Skull: Normal. Negative for fracture or focal lesion. Sinuses/Orbits: No acute finding. Other: Negative for scalp hematoma. IMPRESSION: 1. No acute intracranial findings. 2. Chronic microvascular ischemic change and cerebral volume loss. Electronically Signed   By: Davina Poke D.O.   On: 05/03/2021 18:52    Procedures .Critical Care  Date/Time: 05/03/2021 7:14 PM Performed by: Lennice Sites, DO Authorized by: Lennice Sites, DO   Critical care provider  statement:    Critical care time (minutes):  45   Critical care was necessary to treat or  prevent imminent or life-threatening deterioration of the following conditions:  Circulatory failure   Critical care was time spent personally by me on the following activities:  Blood draw for specimens, development of treatment plan with patient or surrogate, discussions with primary provider, discussions with consultants, evaluation of patient's response to treatment, examination of patient, obtaining history from patient or surrogate, ordering and performing treatments and interventions, ordering and review of laboratory studies, ordering and review of radiographic studies, pulse oximetry, re-evaluation of patient's condition and review of old charts   I assumed direction of critical care for this patient from another provider in my specialty: no     Care discussed with: admitting provider     Medications Ordered in ED Medications  pantoprazole (PROTONIX) 80 mg /NS 100 mL IVPB (has no administration in time range)  pantoprozole (PROTONIX) 80 mg /NS 100 mL infusion (8 mg/hr Intravenous New Bag/Given 05/03/21 1851)  pantoprazole (PROTONIX) injection 40 mg (has no administration in time range)  0.9 %  sodium chloride infusion (has no administration in time range)    ED Course  I have reviewed the triage vital signs and the nursing notes.  Pertinent labs & imaging results that were available during my care of the patient were reviewed by me and considered in my medical decision making (see chart for details).    MDM Rules/Calculators/A&P                           Susan [name] is here with GI bleed.  Normal vitals.  No fever.  She has had dark stools with some passing of clots the last 3 to 4 days.  She thinks that she had a syncopal episode yesterday and hit her head.  She is not on any blood thinners.  She has had recent GI bleeds this past year.  Overall she has melanotic stool on exam.  No abdominal pain.  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  She has also had  possibly an upper GI bleed.  We will start her on IV Protonix bolus and infusion.  We will get basic labs.  I have sent a message to her GI group, messaged Dr. Silverio Decamp with [facility].   Hemoglobin is 5.8.  Otherwise blood work is unremarkable.  We will transfuse her 2 units of blood and have her admitted to medicine for further care.  Hemodynamically stable throughout my care.  This chart was dictated using voice recognition software.  Despite best efforts to proofread,  errors can occur which can change the documentation meaning.   Final Clinical Impression(s) / ED Diagnoses Final diagnoses:  Acute GI bleeding    Rx / DC Orders ED Discharge Orders     None        Lennice Sites, DO 05/03/21 1915

## 2021-05-03 NOTE — ED Notes (Signed)
Patient was given 2 warm blankets and socks were put on her feet.

## 2021-05-04 ENCOUNTER — Inpatient Hospital Stay (HOSPITAL_COMMUNITY): Payer: Medicare Other | Admitting: Anesthesiology

## 2021-05-04 ENCOUNTER — Encounter (HOSPITAL_COMMUNITY): Payer: Self-pay | Admitting: Family Medicine

## 2021-05-04 ENCOUNTER — Encounter (HOSPITAL_COMMUNITY)
Admission: EM | Disposition: A | Discharge status: Home / Self Care | Payer: Self-pay | Source: Home / Self Care | Attending: Internal Medicine

## 2021-05-04 DIAGNOSIS — K2970 Gastritis, unspecified, without bleeding: Secondary | ICD-10-CM

## 2021-05-04 DIAGNOSIS — K5520 Angiodysplasia of colon without hemorrhage: Secondary | ICD-10-CM

## 2021-05-04 DIAGNOSIS — K922 Gastrointestinal hemorrhage, unspecified: Secondary | ICD-10-CM | POA: Diagnosis not present

## 2021-05-04 DIAGNOSIS — K921 Melena: Secondary | ICD-10-CM

## 2021-05-04 HISTORY — PX: HOT HEMOSTASIS: SHX5433

## 2021-05-04 HISTORY — PX: ENTEROSCOPY: SHX5533

## 2021-05-04 HISTORY — PX: BIOPSY: SHX5522

## 2021-05-04 HISTORY — PX: SUBMUCOSAL TATTOO INJECTION: SHX6856

## 2021-05-04 LAB — URINALYSIS, ROUTINE W REFLEX MICROSCOPIC
Bilirubin Urine: NEGATIVE
Glucose, UA: NEGATIVE mg/dL
Ketones, ur: NEGATIVE mg/dL
Nitrite: NEGATIVE
Protein, ur: 30 mg/dL — AB
Specific Gravity, Urine: 1.012 (ref 1.005–1.030)
WBC, UA: >50 WBC/hpf — ABNORMAL HIGH (ref 0–5)
pH: 5 (ref 5.0–8.0)

## 2021-05-04 LAB — CBC
HCT: 28 % — ABNORMAL LOW (ref 36.0–46.0)
HCT: 27.7 % — ABNORMAL LOW (ref 36.0–46.0)
Hemoglobin: 9.2 g/dL — ABNORMAL LOW (ref 12.0–15.0)
Hemoglobin: 9.3 g/dL — ABNORMAL LOW (ref 12.0–15.0)
MCH: 26.2 pg (ref 26.0–34.0)
MCH: 26.6 pg (ref 26.0–34.0)
MCHC: 32.9 g/dL (ref 30.0–36.0)
MCHC: 33.6 g/dL (ref 30.0–36.0)
MCV: 79.8 fL — ABNORMAL LOW (ref 80.0–100.0)
MCV: 79.4 fL — ABNORMAL LOW (ref 80.0–100.0)
Platelets: 87 10*3/uL — ABNORMAL LOW (ref 150–400)
Platelets: 88 10*3/uL — ABNORMAL LOW (ref 150–400)
RBC: 3.51 MIL/uL — ABNORMAL LOW (ref 3.87–5.11)
RBC: 3.49 MIL/uL — ABNORMAL LOW (ref 3.87–5.11)
RDW: 17.6 % — ABNORMAL HIGH (ref 11.5–15.5)
RDW: 18 % — ABNORMAL HIGH (ref 11.5–15.5)
WBC: 5.6 10*3/uL (ref 4.0–10.5)
WBC: 6.6 10*3/uL (ref 4.0–10.5)
nRBC: 0 % (ref 0.0–0.2)
nRBC: 0 % (ref 0.0–0.2)

## 2021-05-04 LAB — BASIC METABOLIC PANEL
Anion gap: 6 (ref 5–15)
BUN: 34 mg/dL — ABNORMAL HIGH (ref 8–23)
CO2: 23 mmol/L (ref 22–32)
Calcium: 9.5 mg/dL (ref 8.9–10.3)
Chloride: 111 mmol/L (ref 98–111)
Creatinine, Ser: 1.99 mg/dL — ABNORMAL HIGH (ref 0.44–1.00)
GFR, Estimated: 25 mL/min — ABNORMAL LOW (ref >60)
Glucose, Bld: 84 mg/dL (ref 70–99)
Potassium: 3.8 mmol/L (ref 3.5–5.1)
Sodium: 140 mmol/L (ref 135–145)

## 2021-05-04 SURGERY — ENTEROSCOPY
Anesthesia: Monitor Anesthesia Care

## 2021-05-04 MED ORDER — PROPOFOL 500 MG/50ML IV EMUL
INTRAVENOUS | Status: DC | PRN
  Administered 2021-05-04: 125 ug/kg/min via INTRAVENOUS

## 2021-05-04 MED ORDER — PANTOPRAZOLE SODIUM 40 MG IV SOLR
40.0000 mg | Freq: Two times a day (BID) | INTRAVENOUS | Status: DC
  Administered 2021-05-04 – 2021-05-06 (×5): 40 mg via INTRAVENOUS
  Filled 2021-05-04 (×5): qty 40

## 2021-05-04 MED ORDER — CIPROFLOXACIN IN D5W 400 MG/200ML IV SOLN
400.0000 mg | INTRAVENOUS | Status: DC
  Administered 2021-05-04 – 2021-05-06 (×3): 400 mg via INTRAVENOUS
  Filled 2021-05-04 (×3): qty 200

## 2021-05-04 MED ORDER — ALBUMIN HUMAN 5 % IV SOLN
INTRAVENOUS | Status: AC
  Filled 2021-05-04: qty 250

## 2021-05-04 MED ORDER — PROPOFOL 10 MG/ML IV BOLUS
INTRAVENOUS | Status: DC | PRN
  Administered 2021-05-04 (×4): 20 mg via INTRAVENOUS

## 2021-05-04 MED ORDER — LIDOCAINE 2% (20 MG/ML) 5 ML SYRINGE
INTRAMUSCULAR | Status: DC | PRN
  Administered 2021-05-04: 100 mg via INTRAVENOUS

## 2021-05-04 MED ORDER — SODIUM CHLORIDE 0.9 % IV SOLN
INTRAVENOUS | Status: DC | PRN
  Administered 2021-05-04: 1000 mL via INTRAVENOUS

## 2021-05-04 MED ORDER — EPHEDRINE SULFATE-NACL 50-0.9 MG/10ML-% IV SOSY
PREFILLED_SYRINGE | INTRAVENOUS | Status: DC | PRN
  Administered 2021-05-04: 5 mg via INTRAVENOUS

## 2021-05-04 MED ORDER — ALBUMIN HUMAN 5 % IV SOLN
12.5000 g | Freq: Once | INTRAVENOUS | Status: AC
  Administered 2021-05-04: 12.5 g via INTRAVENOUS

## 2021-05-04 MED ORDER — SODIUM CHLORIDE 0.9 % IV SOLN: INTRAVENOUS | Status: DC

## 2021-05-04 MED ORDER — SPOT INK MARKER SYRINGE KIT
PACK | SUBMUCOSAL | Status: DC | PRN
  Administered 2021-05-04: 2 mL via SUBMUCOSAL

## 2021-05-04 NOTE — Transfer of Care (Addendum)
Immediate Anesthesia Transfer of Care Note  Patient: Susan [name]  Procedure(s) Performed: ENTEROSCOPY HOT HEMOSTASIS (ARGON PLASMA COAGULATION/BICAP) SUBMUCOSAL TATTOO INJECTION BIOPSY  Patient Location: Endoscopy Unit  Anesthesia Type:MAC  Level of Consciousness: drowsy  Airway & Oxygen Therapy: Patient Spontanous Breathing and Patient connected to face mask oxygen  Post-op Assessment: Report given to RN and Post -op Vital signs reviewed and unstable, Anesthesiologist notified  Post vital signs: Reviewed and unstable  Last Vitals:  Vitals Value Taken Time  BP 89/54 05/04/21 1030  Temp    Pulse 64 05/04/21 1030  Resp 15 05/04/21 1030  SpO2 100 % 05/04/21 1030  Vitals shown include unvalidated device data.  Last Pain:  Vitals:   05/04/21 0922  TempSrc: Oral  PainSc: 0-No pain         Complications: No notable events documented.

## 2021-05-04 NOTE — ED Notes (Signed)
Blood received from lab and placed on pt's RN desk. Notified RN

## 2021-05-04 NOTE — Progress Notes (Signed)
Pharmacy Antibiotic Note  Susan [name] is a 80 y.o. female admitted on 05/03/2021 with GIB and UTI.  Pharmacy has been consulted for Cipro dosing. AoCKD, SCr 2.46 on admit improved to 1.99 with CrCl < 30 Antibiotic allergies:  PCN (anaphylaxis), Sulfa/bactrim (itching, swelling), clarithromycin (skin peeling), Flagyl (itching, swelling) No history of any cephalosporins given through [facility].   Baseline QTcB 441  Plan: Cipro 400mg  IV q24h  Follow up renal function, culture results, and clinical course.    Height: 5\' 2"  (157.5 cm) Weight: 62.6 kg (138 lb) IBW/kg (Calculated) : 50.1  Temp (24hrs), Avg:98.1 F (36.7 C), Min:97.9 F (36.6 C), Max:98.6 F (37 C)  Recent Labs  Lab 05/03/21 1751 05/04/21 0414 05/04/21 0805  WBC 6.0 5.6 6.6  CREATININE 2.46* 1.99*  --     Estimated Creatinine Clearance: 19.3 mL/min (A) (by C-G formula based on SCr of 1.99 mg/dL (H)).    Allergies  Allergen Reactions   Codeine Anaphylaxis   Penicillins Anaphylaxis   Bactrim Itching and Swelling   Clarithromycin Other (See Comments)    "Caused skin to peel off my hand."   Flagyl [Metronidazole Hcl] Itching and Swelling   Food Other (See Comments)    EATS WHITE RICE ONLY (ALLERGIC REACTION TO YELLOW OR BROWN RICE THAT RESULTED IN HOSPITALIZATION)   Pineapple Itching and Swelling    SWELLING OF EYES   Sulfa Antibiotics Itching and Swelling    Antimicrobials this admission: 8/2 Cipro >>   Dose adjustments this admission:   Microbiology results: 8/2 Covid, influenza: neg 8/2 UCx:    Thank you for allowing pharmacy to be a part of this patient's care.  Gretta Arab PharmD, BCPS Clinical Pharmacist [facility] main pharmacy [phone] 05/04/2021 8:55 AM

## 2021-05-04 NOTE — Progress Notes (Signed)
Pt arrives to recovery. BP 89/54. Dr. Valma Cava made aware. VO for Albumin given. Albumin up a/o.

## 2021-05-04 NOTE — Op Note (Addendum)
[facility] Patient Name: Susan [name] Procedure Date: 05/04/2021 MRN: [id number] Attending MD: Ladene Artist , MD Date of Birth: Aug 22, 1941 CSN: [id number] Age: 80 Admit Type: Inpatient Procedure:                Small bowel enteroscopy Indications:              [place] Providers:                Pricilla Riffle. Fuller Plan, MD, Baird Cancer, RN, Tyna Jaksch Technician Referring MD:             [place] Medicines:                Monitored Anesthesia Care Complications:            No immediate complications. Estimated Blood Loss:     Estimated blood loss was minimal. Procedure:                Pre-Anesthesia Assessment:                           - Prior to the procedure, a History and Physical                            was performed, and patient medications and                            allergies were reviewed. The patient's tolerance of                            previous anesthesia was also reviewed. The risks                            and benefits of the procedure and the sedation                            options and risks were discussed with the patient.                            All questions were answered, and informed consent                            was obtained. Prior Anticoagulants: The patient has                            taken no previous anticoagulant or antiplatelet                            agents. ASA Grade Assessment: III - A patient with                            severe systemic disease. After reviewing the risks  and benefits, the patient was deemed in                            satisfactory condition to undergo the procedure.                           After obtaining informed consent, the endoscope was                            passed under direct vision. Throughout the                            procedure, the patient's blood pressure, pulse, and                            oxygen saturations were  monitored continuously. The                            [id number]-Q180 ([id number]) Olympus enteroscope was                            introduced through the mouth and advanced to the                            duodenum. Due to scope looping unable to advance                            beyond the duodenum and the ultraslim enteroscope                            was withdrawn. The [id number]-HQ190L ([id number]) Olympus                            colonoscope was introduced through the mouth and                            advanced to the proximal jejunum. The small bowel                            enteroscopy was accomplished without difficulty.                            The patient tolerated the procedure well. Scope In: Scope Out: Findings:      Localized, white plaques were found in the proximal esophagus. Biopsies       were taken with a cold forceps for histology.      The exam of the esophagus was otherwise normal.      A single localized spot with bleeding was found in the cardia. Focal       bleeding gastritis or possible AVM. Coagulation for hemostasis using       argon plasma (gastric settings) was successful.      A small hiatal hernia was present.      Diffuse moderate inflammation characterized by congestion (edema),       erythema and granularity  was found in the gastric fundus and in the       gastric body. Biopsies were taken with a cold forceps for histology.      The exam of the stomach was otherwise normal.      There was no evidence of significant pathology in the entire examined       duodenum.      A single 4 mm angiodysplastic lesion with no bleeding was found in the       proximal jejunum. Coagulation for bleeding prevention using argon plasma       (right colon settings) was successful.      A tattoo was placed in the proximal jejunum at the distal extent of the       exam. The tattoo site appeared normal. Area was tattooed with an       injection of 2 mL of Spot (carbon black).       Exam of the jejunum was otherwise normal. Impression:               - Esophageal plaques were found, suspicious for                            candidiasis. Biopsied.                           - A single spot with bleeding in the gastric                            cardia. Treated with argon plasma coagulation (APC).                           - Small hiatal hernia.                           - Gastritis. Biopsied.                           - Normal examined duodenum.                           - A single non-bleeding angiodysplastic lesion in                            the proximal jejunum. Treated with argon plasma                            coagulation (APC).                           - A tattoo was placed in the proximal jejunum at                            the distal extent of the exam. The tattoo site                            appeared normal. Recommendation:           - Return patient to hospital [facility] for ongoing care.                           -  Gastric cardia and proximal jejunum findings are                            potential sources of acute bleed.                           - Clear liquid diet for now.                           - Await pathology results.                           - Change pantoprazole to 40 mg IV bid for now and                            then PO qd at discharge.                           - Otherwise continue present medications.                           - Outpatient GI follow up with Dr. Silverio Decamp. Procedure Code(s):        --- Professional ---                           [phone], 58, Small intestinal endoscopy, enteroscopy                            beyond second portion of duodenum, not including                            ileum; with control of bleeding (eg, injection,                            bipolar cautery, unipolar cautery, laser, heater                            probe, stapler, plasma coagulator)                           44361, Small intestinal  endoscopy, enteroscopy                            beyond second portion of duodenum, not including                            ileum; with biopsy, single or multiple                           44799, Unlisted procedure, small intestine Diagnosis Code(s):        --- Professional ---                           K22.9, Disease of esophagus, unspecified  K92.2, Gastrointestinal hemorrhage, unspecified                           K44.9, Diaphragmatic hernia without obstruction or                            gangrene                           K29.70, Gastritis, unspecified, without bleeding                           K55.20, Angiodysplasia of colon without hemorrhage                           K92.1, Melena (includes Hematochezia) CPT copyright 2019 [employer] Medical Association. All rights reserved. The codes documented in this report are preliminary and upon coder review may  be revised to meet current compliance requirements. Ladene Artist, MD 05/04/2021 10:41:43 AM This report has been signed electronically. Number of Addenda: 0

## 2021-05-04 NOTE — Anesthesia Postprocedure Evaluation (Signed)
Anesthesia Post Note  Patient: Susan [name]  Procedure(s) Performed: ENTEROSCOPY HOT HEMOSTASIS (ARGON PLASMA COAGULATION/BICAP) SUBMUCOSAL TATTOO INJECTION BIOPSY     Patient location during evaluation: Endoscopy Anesthesia Type: MAC Level of consciousness: awake and alert Pain management: pain level controlled Vital Signs Assessment: post-procedure vital signs reviewed and stable Respiratory status: spontaneous breathing, nonlabored ventilation, respiratory function stable and patient connected to nasal cannula oxygen Cardiovascular status: blood pressure returned to baseline and stable Postop Assessment: no apparent nausea or vomiting Anesthetic complications: no   No notable events documented.  Last Vitals:  Vitals:   05/04/21 1040 05/04/21 1050  BP: 106/63 121/65  Pulse: 60 62  Resp: 15 15  Temp:    SpO2: 100% 99%    Last Pain:  Vitals:   05/04/21 1050  TempSrc:   PainSc: 0-No pain                 Barnet Glasgow

## 2021-05-04 NOTE — Anesthesia Preprocedure Evaluation (Addendum)
Anesthesia Evaluation  Patient identified by MRN, date of birth, ID band Patient awake    Reviewed: Allergy & Precautions, NPO status , Patient's Chart, lab work & pertinent test results  Airway Mallampati: I  TM Distance: >3 FB Neck ROM: Full    Dental no notable dental hx. (+) Edentulous Upper, Edentulous Lower   Pulmonary former smoker,    Pulmonary exam normal breath sounds clear to auscultation       Cardiovascular hypertension, Normal cardiovascular exam Rhythm:Regular Rate:Normal     Neuro/Psych negative neurological ROS     GI/Hepatic Neg liver ROS, GERD  ,  Endo/Other  Hypothyroidism   Renal/GU Renal diseaseLab Results      Component                Value               Date                      CREATININE               1.99 (H)            05/04/2021                BUN                      34 (H)              05/04/2021                NA                       140                 05/04/2021                K                        3.8                 05/04/2021                CL                       111                 05/04/2021                CO2                      23                  05/04/2021                Musculoskeletal  (+) Arthritis ,   Abdominal   Peds  Hematology  (+) anemia , Myelodysplasia  Lab Results      Component                Value               Date                      WBC                      6.6  05/04/2021                HGB                      9.3 (L)             05/04/2021                HCT                      27.7 (L)            05/04/2021                MCV                      79.4 (L)            05/04/2021                PLT                      88 (L)              05/04/2021              Anesthesia Other Findings All: Codeine PCN Bactrim Biaxin  Reproductive/Obstetrics                            Anesthesia Physical Anesthesia  Plan  ASA: 3 and emergent  Anesthesia Plan: MAC   Post-op Pain Management:    Induction:   PONV Risk Score and Plan: Treatment may vary due to age or medical condition  Airway Management Planned: Natural Airway  Additional Equipment: None  Intra-op Plan:   Post-operative Plan:   Informed Consent: I have reviewed the patients History and Physical, chart, labs and discussed the procedure including the risks, benefits and alternatives for the proposed anesthesia with the patient or authorized representative who has indicated his/her understanding and acceptance.     Dental advisory given  Plan Discussed with: CRNA and Anesthesiologist  Anesthesia Plan Comments:        Anesthesia Quick Evaluation

## 2021-05-04 NOTE — H&P (View-Only) (Signed)
Referring Provider: Dr. Dessa Phi  Primary Care Physician:  Sonia Side., [name] Primary Gastroenterologist:  Dr. Silverio Decamp   Reason for Consultation:  GI bleed, melena and anemia   HPI: Susan [name] is a 80 y.o. female with a past medical history of arthritis, hypertension, hypothyroidism, anemia, thrombocytopenia, myelodysplasia, CKD stage III, mild dementia, GERD, UGI bleed/melena 01/2021 and diverticular bleed 03/2021.  She felt well until Saturday 05/01/2021 when she started passing 2 to 3 black colored stools. Sunday 7/31 she felt weak in her legs and passed 2 or 3 more black solid stools. She developed generalized weakness and she fell at home and hit the back of her head.  She did not lose consciousness.  No associated chest pain, palpitations or shortness of breath.  Monday 8/1 she passed 1 black stool with dark black-colored clots, she felt cold and continued to have weakness in her legs so she presented to [facility] ED 05/03/2021 for further evaluation.  Labs in the ED showed a hemoglobin level of 5.8 (Hg 9.8 on 6/14).  Hematocrit 18.0.  Platelet 92.  BUN 99 (BUN 96 on 6/14).  Creatinine 2.46 (Cr 1.59 on 6/14).  LFTs were normal.  SARS coronavirus 2 negative. On rectal exam by the ED physician she had evidence of melenic stool.  She was transfused 2 units of PRBCs.  Posttransfusion hemoglobin level 9.2.  She received Protonix IV bolus followed by infusion 8 mg/hr. A GI consult was requested for further evaluation.   No further melenic stools since admission.  She is not on any anticoagulation.  She denies taking ASA since she was discharged from the hospital 03/2021 with a diverticular bleed.  No NSAID use.  No nausea or vomiting.  No dysphagia or heartburn.  No upper or lower abdominal pain.  He reported taking pantoprazole 40 mg p.o. twice daily as prescribed.  Her appetite has been good.  No weight loss.  No fever, sweats or chills.  She lives at home alone and her son lives in  [place].  She has a history of mild dementia but she is cognitively grossly intact at this time.  No family at the bedside.  She has a history of upper and lower GI bleeds. She was admitted to the hospital 01/08/2021 with upper GI bleed with melena and anemia. Admission Hg 7.7 -> 7.1. She underwent an EGD 01/09/2021 in the hospital due to upper GI bleeding which identified a friable area at the GE junction and there was a small amount of red blood at the site possibly representing reflux esophagitis and nonspecific gastritis.  Biopsies were negative for H. Pylori.  She was discharged home 01/10/2021 on Pantoprazole 40 mg p.o. twice daily  She was admitted to the hospital 03/12/2021 after she reported having an episode of hematochezia and a hemoglobin level was 6.2 done at Dr. Hazeline Junker office.  She was transfused 2 units of PRBCs at the cancer center and she had 2 episodes of hematochezia while she was there so she was sent to the ED for further evaluation.  A repeat hemoglobin level was 9.1.  CTA of the abdomen pelvis 6/11 identified active bleeding from the diverticulum in the right colon and she subsequently underwent angiography with coil embolization of the cecal branch of the SMA by Dr. Earleen Newport.  Her hemoglobin level dropped to 6.7 on 612 and she received another 2 units of PRBCs without further hematochezia and she was discharged home on 03/16/2021.  Her hemoglobin level  was 9.8 at the time of discharge.  She underwent a colonoscopy 02/10/2017 which identified severe diverticulosis in the sigmoid, descending, transverse and ascending colon.  Peridiverticular erythema was seen with some narrowing of the colon associated with a diverticular opening.  Head CT 05/03/2021: 1. No acute intracranial findings. 2. Chronic microvascular ischemic change and cerebral volume loss.  Abdominal/pelvic CT angiogram 03/13/2021: 1. Positive for active diverticular bleed arising from the proximal ascending colon in the  right lower quadrant. 2. Extensive colonic diverticulosis without evidence of active inflammation. 3. Small volume free fluid in the anatomic pelvis is nonspecific but abnormal for a post reproductive age female. 4. Tortuous and atherosclerotic abdominal aorta. Aortic Atherosclerosis (ICD10-I70.0). 5. Cardiomegaly. 6. Stable chronic L1 compression fracture. 7. Multilevel degenerative disc disease with grade 1 anterolisthesis of L5 on S1.   EGD 01/09/2021: - The GE junction was friable in a spoke like pattern, and there was a small amount of red blood at the site. This may represent reflux esophagitis. - Mild, non-specific gastritis. Biopsied to check for H. Pylori A. GASTRIC, BIOPSY:  - Gastric antral and oxyntic mucosa with mild chronic gastritis  - Warthin Starry stain is negative for Helicobacter pylori   Colonoscopy 02/10/2017: - Severe diverticulosis in the sigmoid colon, in the descending colon, in the transverse colon and in the ascending colon. There was narrowing of the colon in association with the diverticular opening. There was evidence of diverticular spasm. Peri-diverticular erythema was seen. There was evidence of an impacted diverticulum. Petechia were visualized in association with the diverticular opening. - Non-bleeding internal hemorrhoids. - The examination was otherwise normal. - No specimens collected.  Past Medical History:  Diagnosis Date   Anemia    Arthritis    Blood transfusion without reported diagnosis    GERD (gastroesophageal reflux disease)    HLD (hyperlipidemia)    Hypertension    Hypothyroidism    Memory loss    Myelodysplasia    Vitamin D deficiency     Past Surgical History:  Procedure Laterality Date   BIOPSY  01/09/2021   Procedure: BIOPSY;  Surgeon: Milus Banister, MD;  Location: [facility];  Service: Endoscopy;;   CARPAL TUNNEL RELEASE Right    DILATION AND CURETTAGE OF UTERUS     ESOPHAGOGASTRODUODENOSCOPY (EGD) WITH  PROPOFOL N/A 01/09/2021   Procedure: ESOPHAGOGASTRODUODENOSCOPY (EGD) WITH PROPOFOL;  Surgeon: Milus Banister, MD;  Location: [facility];  Service: Endoscopy;  Laterality: N/A;   INGUINAL HERNIA REPAIR     IR ANGIOGRAM FOLLOW UP STUDY  03/13/2021   IR ANGIOGRAM VISCERAL SELECTIVE  03/13/2021   IR EMBO ART  VEN HEMORR LYMPH EXTRAV  INC GUIDE ROADMAPPING  03/13/2021   IR US GUIDE VASC ACCESS RIGHT  03/13/2021   ROTATOR CUFF REPAIR Left    TRANSANAL HEMORRHOIDAL DEARTERIALIZATION N/A 03/31/2017   Procedure: TRANSANAL HEMORRHOIDAL DEARTERIALIZATION;  Surgeon: Leighton Ruff, MD;  Location: [facility];  Service: General;  Laterality: N/A;   UMBILICAL HERNIA REPAIR      Prior to Admission medications   Medication Sig Start Date End Date Taking? Authorizing Provider  acetaminophen (TYLENOL) 500 MG tablet Take 500 mg by mouth every 6 (six) hours as needed for moderate pain (for pain.).   Yes [provider]  allopurinol (ZYLOPRIM) 300 MG tablet Take 300 mg by mouth daily. 07/20/20  Yes [provider]  amLODipine (NORVASC) 10 MG tablet TAKE 1/2 TABLET BY MOUTH DAILY AT BEDTIME; (TAKE 1 TABLET IF SBP IS >150) Patient taking differently:  Take 10 mg by mouth daily. 07/20/17  Yes [name], NP  aspirin 81 MG chewable tablet Chew 81 mg by mouth daily.   Yes [provider]  donepezil (ARICEPT) 10 MG tablet Take 10 mg by mouth daily. 07/20/20  Yes [provider]  famotidine (PEPCID) 20 MG tablet Take 20 mg by mouth 2 (two) times daily. 03/08/21  Yes [provider]  labetalol (NORMODYNE) 200 MG tablet Take 200 mg by mouth 2 (two) times daily. 06/23/20  Yes [provider]  levothyroxine (SYNTHROID) 75 MCG tablet Take 75 mcg by mouth every morning. 12/18/20  Yes [provider]  memantine (NAMENDA) 10 MG tablet Take 1 tablet (10 mg total) by mouth 2 (two) times daily. 11/30/20  Yes [name], NP  mirtazapine (REMERON) 15 MG tablet Take 15 mg by  mouth at bedtime. 07/20/20  Yes [provider]  montelukast (SINGULAIR) 10 MG tablet Take 10 mg by mouth at bedtime.   Yes [provider]  pantoprazole (PROTONIX) 40 MG tablet Take 1 tablet (40 mg total) by mouth 2 (two) times daily before a meal. 01/10/21  Yes Little Ishikawa, MD  traMADol (ULTRAM) 50 MG tablet Take 50 mg by mouth every 6 (six) hours as needed for moderate pain.   Yes [provider]    Current Facility-Administered Medications  Medication Dose Route Frequency Provider Last Rate Last Admin   0.9 %  sodium chloride infusion (Manually program via Guardrails IV Fluids)   Intravenous Once Opyd, Ilene Qua, MD       0.9 %  sodium chloride infusion   Intravenous Continuous Opyd, Ilene Qua, MD 75 mL/hr at 05/04/21 0415 New Bag at 05/04/21 0415   donepezil (ARICEPT) tablet 10 mg  10 mg Oral Daily Opyd, Ilene Qua, MD       levothyroxine (SYNTHROID) tablet 75 mcg  75 mcg Oral Q0600 Vianne Bulls, MD   75 mcg at 05/04/21 0529   memantine (NAMENDA) tablet 10 mg  10 mg Oral BID Vianne Bulls, MD   10 mg at 05/03/21 2334   mirtazapine (REMERON) tablet 15 mg  15 mg Oral QHS Opyd, Ilene Qua, MD   15 mg at 05/03/21 2336   ondansetron (ZOFRAN) tablet 4 mg  4 mg Oral Q6H PRN Opyd, Ilene Qua, MD       Or   ondansetron (ZOFRAN) injection 4 mg  4 mg Intravenous Q6H PRN Opyd, Ilene Qua, MD       pantoprazole (PROTONIX) 80 mg /NS 100 mL IVPB  80 mg Intravenous Once Opyd, Ilene Qua, MD       [START ON 05/07/2021] pantoprazole (PROTONIX) injection 40 mg  40 mg Intravenous Q12H Opyd, Ilene Qua, MD       pantoprozole (PROTONIX) 80 mg /NS 100 mL infusion  8 mg/hr Intravenous Continuous Opyd, Ilene Qua, MD 10 mL/hr at 05/04/21 0200 8 mg/hr at 05/04/21 0200   sodium chloride flush (NS) 0.9 % injection 3 mL  3 mL Intravenous Q12H Opyd, Ilene Qua, MD       Current Outpatient Medications  Medication Sig Dispense Refill   acetaminophen (TYLENOL) 500 MG tablet Take 500 mg by  mouth every 6 (six) hours as needed for moderate pain (for pain.).     allopurinol (ZYLOPRIM) 300 MG tablet Take 300 mg by mouth daily.     amLODipine (NORVASC) 10 MG tablet TAKE 1/2 TABLET BY MOUTH DAILY AT BEDTIME; (TAKE 1 TABLET IF SBP IS >  150) (Patient taking differently: Take 10 mg by mouth daily.) 90 tablet 0   aspirin 81 MG chewable tablet Chew 81 mg by mouth daily.     donepezil (ARICEPT) 10 MG tablet Take 10 mg by mouth daily.     famotidine (PEPCID) 20 MG tablet Take 20 mg by mouth 2 (two) times daily.     labetalol (NORMODYNE) 200 MG tablet Take 200 mg by mouth 2 (two) times daily.     levothyroxine (SYNTHROID) 75 MCG tablet Take 75 mcg by mouth every morning.     memantine (NAMENDA) 10 MG tablet Take 1 tablet (10 mg total) by mouth 2 (two) times daily. 60 tablet 11   mirtazapine (REMERON) 15 MG tablet Take 15 mg by mouth at bedtime.     montelukast (SINGULAIR) 10 MG tablet Take 10 mg by mouth at bedtime.     pantoprazole (PROTONIX) 40 MG tablet Take 1 tablet (40 mg total) by mouth 2 (two) times daily before a meal. 60 tablet 0   traMADol (ULTRAM) 50 MG tablet Take 50 mg by mouth every 6 (six) hours as needed for moderate pain.      Allergies as of 05/03/2021 - Review Complete 05/03/2021  Allergen Reaction Noted   Codeine Anaphylaxis 12/28/2011   Penicillins Anaphylaxis 12/28/2011   Bactrim Itching and Swelling 12/28/2011   Clarithromycin Other (See Comments) 12/28/2011   Flagyl [metronidazole hcl] Itching and Swelling 12/28/2011   Food Other (See Comments) 03/24/2017   Pineapple Itching and Swelling 03/24/2017   Sulfa antibiotics Itching and Swelling 12/28/2011    Family History  Problem Relation Age of Onset   Stroke Father    Hypertension Father    Other Mother        died at early age   Stroke Sister    Coronary artery disease Brother    Hypertension Sister    Bladder Cancer Sister    Colon cancer Neg Hx    Stomach cancer Neg Hx     Social History    Socioeconomic History   Marital status: Single    Spouse name: Not on file   Number of children: 1   Years of education: college   Highest education level: Bachelor's degree (e.g., BA, AB, BS)  Occupational History   Occupation: retired    Fish farm manager: RETIRED  Tobacco Use   Smoking status: Former    Types: Cigarettes    Quit date: 10/02/1996    Years since quitting: 24.6   Smokeless tobacco: Never  Vaping Use   Vaping Use: Never used  Substance and Sexual Activity   Alcohol use: Yes    Comment: glass of wine at least once a month.   Drug use: No   Sexual activity: Not Currently    Partners: Male  Other Topics Concern   Not on file  Social History Narrative   Lives at home alone.   Right-handed.   No daily use of caffeine.   Social Determinants of Health   Financial Resource Strain: Not on file  Food Insecurity: Not on file  Transportation Needs: Not on file  Physical Activity: Not on file  Stress: Not on file  Social Connections: Not on file  Intimate Partner Violence: Not on file    Review of Systems: Gen: Denies fever, sweats or chills. No weight loss.  CV: Denies chest pain, palpitations or edema. Resp: Denies cough, shortness of breath of hemoptysis.  GI: See HPI GU : Denies urinary burning, blood in urine, increased urinary  frequency or incontinence. MS: Denies joint pain, muscles aches or weakness. Derm: Denies rash, itchiness, skin lesions or unhealing ulcers. Psych: + Mild dementia.  Denies depression or memory loss. Heme: Denies easy bruising, bleeding. Neuro:  Denies headaches, dizziness or paresthesias. Endo:  Denies any problems with DM, thyroid or adrenal function.  Physical Exam: Vital signs in last 24 hours: Temp:  [97.9 F (36.6 C)-98.6 F (37 C)] 98.1 F (36.7 C) (08/02 0307) Pulse Rate:  [58-81] 69 (08/02 0724) Resp:  [12-21] 15 (08/02 0724) BP: (107-137)/(65-98) 124/70 (08/02 0500) SpO2:  [97 %-100 %] 100 % (08/02 0724) Weight:   [62.6 kg] 62.6 kg (08/01 1800)   General:  Alert 80 year old female in no acute distress. Head:  Normocephalic and atraumatic. Eyes:  No scleral icterus. Conjunctiva pink. Ears:  Normal auditory acuity. Nose:  No deformity, discharge or lesions. Mouth:  Dentition intact. No ulcers or lesions.  Neck:  Supple. No lymphadenopathy or thyromegaly.  Lungs: Breath sounds clear throughout. Heart: Regular rate and rhythm, no murmurs. Abdomen: Soft, nondistended.  Nontender.  Positive bowel sounds all 4 quadrants. Rectal: Deferred.  Melenic stool per ED physician. + FOBT. Musculoskeletal:  Symmetrical without gross deformities.  Pulses:  Normal pulses noted. Extremities:  Without clubbing or edema. Neurologic:  Alert and  oriented x4. No focal deficits.  Skin:  Intact without significant lesions or rashes. Psych:  Alert and cooperative. Normal mood and affect.  Intake/Output from previous day: 08/01 0701 - 08/02 0700 In: 703.6 [I.V.:22.1; Blood:681.5] Out: 700 [Urine:700] Intake/Output this shift: No intake/output data recorded.  Lab Results: Recent Labs    05/03/21 1751 05/04/21 0414  WBC 6.0 5.6  HGB 5.8* 9.2*  HCT 18.0* 28.0*  PLT 92* 87*   BMET Recent Labs    05/03/21 1751 05/04/21 0414  NA 139 140  K 3.9 3.8  CL 111 111  CO2 22 23  GLUCOSE 99 84  BUN 43* 34*  CREATININE 2.46* 1.99*  CALCIUM 9.4 9.5   LFT Recent Labs    05/03/21 1751  PROT 7.1  ALBUMIN 3.8  AST 18  ALT 9  ALKPHOS 100  BILITOT 0.4  BILIDIR 0.1  IBILI 0.3    Studies/Results: CT HEAD WO CONTRAST (5MM)  Result Date: 05/03/2021 CLINICAL DATA:  Fall, syncope, dizziness, weakness EXAM: CT HEAD WITHOUT CONTRAST TECHNIQUE: Contiguous axial images were obtained from the base of the skull through the vertex without intravenous contrast. COMPARISON:  03/08/2016, 06/04/2020 FINDINGS: Brain: No evidence of acute infarction, hemorrhage, hydrocephalus, extra-axial collection or mass lesion/mass effect.  Extensive low-density changes within the periventricular and subcortical white matter compatible with chronic microvascular ischemic change. Mild diffuse cerebral volume loss. Vascular: Atherosclerotic calcifications involving the large vessels of the skull base. No unexpected hyperdense vessel. Skull: Normal. Negative for fracture or focal lesion. Sinuses/Orbits: No acute finding. Other: Negative for scalp hematoma. IMPRESSION: 1. No acute intracranial findings. 2. Chronic microvascular ischemic change and cerebral volume loss. Electronically Signed   By: Davina Poke D.O.   On: 05/03/2021 18:52    IMPRESSION/PLAN:  80 year old female with a history of myelodysplasia, UGI bleed 01/2021 and diverticular bleed s/p angiography with coil embolization of a colic branch of the SMA 03/2021 admitted to the hospital 05/03/2021 with recurrent upper GI bleed/melena. Admission Hg 5.8 ( Hg 9.8 on 6/14). BUN 99 (baseline BUN 96 on 6/14).  Transfused 2 units PRBCs.  Hemoglobin stable this morning at 9.3.  No further melenic stools since admission.  She is hemodynamically stable. -  NPO -Small bowel enteroscopy this am with Dr. Fuller Plan.  Enteroscopy benefits and risks discussed including risk with sedation, risk of bleeding, perforation and infection  -Continue Pantoprazole 8 mg an hour infusion -IV fluids per the hospitalist -Monitor H&H close -Monitor patient for active GI bleeding -Further recommendations to be determined after enteroscopy completed  Acute on chronic anemia, multifactorial: CKD and recurrent GI bleed in setting of myelodysplasia and thrombocytopenia (PLT 88). She is on Aranasep Q 4 weeks and PRBC transfusions as needed followed by hematologist Dr. Alen Blew  AKI on CKD. Creatinine 2.46 (Cr 1.59 on 6/14).   -IV fluids per the hospitalist    Noralyn Pick  05/04/2021, 08:59Am     Attending Physician Note   I have taken a history, reviewed the chart and examined the patient. I personally  saw the patient and performed a substantive portion of this encounter, including a complete performance of at least one of the key components, in conjunction with the APP. I agree with the APP's note, impression and recommendations.   Impression/Recommendation:  Acute onset of melena with history of a possible UGI bleed in April and diverticular bleed s/p angiography with coil embolization of a colic artery branch of the SMA in June. No further stools since admission. She is hemodynamically stable. Possible recurrent diverticular bleed. R/O UGI tract source. SBE today to further evaluate.   Anemia -  ABL on chronic anemia related to myelodysplasia, CKD. Admission Hgb=5.8. Transfused 2 units PRBCs and Hgb=9.3 this morning.  Trend CBC.  AKI on CKD   Lucio Edward, MD [facility] See Shea Evans, [facility], for our on call provider

## 2021-05-04 NOTE — Consult Note (Addendum)
Referring Provider: Dr. Dessa Phi  Primary Care Physician:  Sonia Side., [name] Primary Gastroenterologist:  Dr. Silverio Decamp   Reason for Consultation:  GI bleed, melena and anemia   HPI: Susan [name] is a 80 y.o. female with a past medical history of arthritis, hypertension, hypothyroidism, anemia, thrombocytopenia, myelodysplasia, CKD stage III, mild dementia, GERD, UGI bleed/melena 01/2021 and diverticular bleed 03/2021.  She felt well until Saturday 05/01/2021 when she started passing 2 to 3 black colored stools. Sunday 7/31 she felt weak in her legs and passed 2 or 3 more black solid stools. She developed generalized weakness and she fell at home and hit the back of her head.  She did not lose consciousness.  No associated chest pain, palpitations or shortness of breath.  Monday 8/1 she passed 1 black stool with dark black-colored clots, she felt cold and continued to have weakness in her legs so she presented to [facility] ED 05/03/2021 for further evaluation.  Labs in the ED showed a hemoglobin level of 5.8 (Hg 9.8 on 6/14).  Hematocrit 18.0.  Platelet 92.  BUN 99 (BUN 96 on 6/14).  Creatinine 2.46 (Cr 1.59 on 6/14).  LFTs were normal.  SARS coronavirus 2 negative. On rectal exam by the ED physician she had evidence of melenic stool.  She was transfused 2 units of PRBCs.  Posttransfusion hemoglobin level 9.2.  She received Protonix IV bolus followed by infusion 8 mg/hr. A GI consult was requested for further evaluation.   No further melenic stools since admission.  She is not on any anticoagulation.  She denies taking ASA since she was discharged from the hospital 03/2021 with a diverticular bleed.  No NSAID use.  No nausea or vomiting.  No dysphagia or heartburn.  No upper or lower abdominal pain.  He reported taking pantoprazole 40 mg p.o. twice daily as prescribed.  Her appetite has been good.  No weight loss.  No fever, sweats or chills.  She lives at home alone and her son lives in  [place].  She has a history of mild dementia but she is cognitively grossly intact at this time.  No family at the bedside.  She has a history of upper and lower GI bleeds. She was admitted to the hospital 01/08/2021 with upper GI bleed with melena and anemia. Admission Hg 7.7 -> 7.1. She underwent an EGD 01/09/2021 in the hospital due to upper GI bleeding which identified a friable area at the GE junction and there was a small amount of red blood at the site possibly representing reflux esophagitis and nonspecific gastritis.  Biopsies were negative for H. Pylori.  She was discharged home 01/10/2021 on Pantoprazole 40 mg p.o. twice daily  She was admitted to the hospital 03/12/2021 after she reported having an episode of hematochezia and a hemoglobin level was 6.2 done at Dr. Hazeline Junker office.  She was transfused 2 units of PRBCs at the cancer center and she had 2 episodes of hematochezia while she was there so she was sent to the ED for further evaluation.  A repeat hemoglobin level was 9.1.  CTA of the abdomen pelvis 6/11 identified active bleeding from the diverticulum in the right colon and she subsequently underwent angiography with coil embolization of the cecal branch of the SMA by Dr. Earleen Newport.  Her hemoglobin level dropped to 6.7 on 612 and she received another 2 units of PRBCs without further hematochezia and she was discharged home on 03/16/2021.  Her hemoglobin level  was 9.8 at the time of discharge.  She underwent a colonoscopy 02/10/2017 which identified severe diverticulosis in the sigmoid, descending, transverse and ascending colon.  Peridiverticular erythema was seen with some narrowing of the colon associated with a diverticular opening.  Head CT 05/03/2021: 1. No acute intracranial findings. 2. Chronic microvascular ischemic change and cerebral volume loss.  Abdominal/pelvic CT angiogram 03/13/2021: 1. Positive for active diverticular bleed arising from the proximal ascending colon in the  right lower quadrant. 2. Extensive colonic diverticulosis without evidence of active inflammation. 3. Small volume free fluid in the anatomic pelvis is nonspecific but abnormal for a post reproductive age female. 4. Tortuous and atherosclerotic abdominal aorta. Aortic Atherosclerosis (ICD10-I70.0). 5. Cardiomegaly. 6. Stable chronic L1 compression fracture. 7. Multilevel degenerative disc disease with grade 1 anterolisthesis of L5 on S1.   EGD 01/09/2021: - The GE junction was friable in a spoke like pattern, and there was a small amount of red blood at the site. This may represent reflux esophagitis. - Mild, non-specific gastritis. Biopsied to check for H. Pylori A. GASTRIC, BIOPSY:  - Gastric antral and oxyntic mucosa with mild chronic gastritis  - Warthin Starry stain is negative for Helicobacter pylori   Colonoscopy 02/10/2017: - Severe diverticulosis in the sigmoid colon, in the descending colon, in the transverse colon and in the ascending colon. There was narrowing of the colon in association with the diverticular opening. There was evidence of diverticular spasm. Peri-diverticular erythema was seen. There was evidence of an impacted diverticulum. Petechia were visualized in association with the diverticular opening. - Non-bleeding internal hemorrhoids. - The examination was otherwise normal. - No specimens collected.  Past Medical History:  Diagnosis Date   Anemia    Arthritis    Blood transfusion without reported diagnosis    GERD (gastroesophageal reflux disease)    HLD (hyperlipidemia)    Hypertension    Hypothyroidism    Memory loss    Myelodysplasia    Vitamin D deficiency     Past Surgical History:  Procedure Laterality Date   BIOPSY  01/09/2021   Procedure: BIOPSY;  Surgeon: Milus Banister, MD;  Location: [facility];  Service: Endoscopy;;   CARPAL TUNNEL RELEASE Right    DILATION AND CURETTAGE OF UTERUS     ESOPHAGOGASTRODUODENOSCOPY (EGD) WITH  PROPOFOL N/A 01/09/2021   Procedure: ESOPHAGOGASTRODUODENOSCOPY (EGD) WITH PROPOFOL;  Surgeon: Milus Banister, MD;  Location: [facility];  Service: Endoscopy;  Laterality: N/A;   INGUINAL HERNIA REPAIR     IR ANGIOGRAM FOLLOW UP STUDY  03/13/2021   IR ANGIOGRAM VISCERAL SELECTIVE  03/13/2021   IR EMBO ART  VEN HEMORR LYMPH EXTRAV  INC GUIDE ROADMAPPING  03/13/2021   IR US GUIDE VASC ACCESS RIGHT  03/13/2021   ROTATOR CUFF REPAIR Left    TRANSANAL HEMORRHOIDAL DEARTERIALIZATION N/A 03/31/2017   Procedure: TRANSANAL HEMORRHOIDAL DEARTERIALIZATION;  Surgeon: Leighton Ruff, MD;  Location: [facility];  Service: General;  Laterality: N/A;   UMBILICAL HERNIA REPAIR      Prior to Admission medications   Medication Sig Start Date End Date Taking? Authorizing Provider  acetaminophen (TYLENOL) 500 MG tablet Take 500 mg by mouth every 6 (six) hours as needed for moderate pain (for pain.).   Yes [provider]  allopurinol (ZYLOPRIM) 300 MG tablet Take 300 mg by mouth daily. 07/20/20  Yes [provider]  amLODipine (NORVASC) 10 MG tablet TAKE 1/2 TABLET BY MOUTH DAILY AT BEDTIME; (TAKE 1 TABLET IF SBP IS >150) Patient taking differently:  Take 10 mg by mouth daily. 07/20/17  Yes [name], NP  aspirin 81 MG chewable tablet Chew 81 mg by mouth daily.   Yes [provider]  donepezil (ARICEPT) 10 MG tablet Take 10 mg by mouth daily. 07/20/20  Yes [provider]  famotidine (PEPCID) 20 MG tablet Take 20 mg by mouth 2 (two) times daily. 03/08/21  Yes [provider]  labetalol (NORMODYNE) 200 MG tablet Take 200 mg by mouth 2 (two) times daily. 06/23/20  Yes [provider]  levothyroxine (SYNTHROID) 75 MCG tablet Take 75 mcg by mouth every morning. 12/18/20  Yes [provider]  memantine (NAMENDA) 10 MG tablet Take 1 tablet (10 mg total) by mouth 2 (two) times daily. 11/30/20  Yes [name], NP  mirtazapine (REMERON) 15 MG tablet Take 15 mg by  mouth at bedtime. 07/20/20  Yes [provider]  montelukast (SINGULAIR) 10 MG tablet Take 10 mg by mouth at bedtime.   Yes [provider]  pantoprazole (PROTONIX) 40 MG tablet Take 1 tablet (40 mg total) by mouth 2 (two) times daily before a meal. 01/10/21  Yes Little Ishikawa, MD  traMADol (ULTRAM) 50 MG tablet Take 50 mg by mouth every 6 (six) hours as needed for moderate pain.   Yes [provider]    Current Facility-Administered Medications  Medication Dose Route Frequency Provider Last Rate Last Admin   0.9 %  sodium chloride infusion (Manually program via Guardrails IV Fluids)   Intravenous Once Opyd, Ilene Qua, MD       0.9 %  sodium chloride infusion   Intravenous Continuous Opyd, Ilene Qua, MD 75 mL/hr at 05/04/21 0415 New Bag at 05/04/21 0415   donepezil (ARICEPT) tablet 10 mg  10 mg Oral Daily Opyd, Ilene Qua, MD       levothyroxine (SYNTHROID) tablet 75 mcg  75 mcg Oral Q0600 Vianne Bulls, MD   75 mcg at 05/04/21 0529   memantine (NAMENDA) tablet 10 mg  10 mg Oral BID Vianne Bulls, MD   10 mg at 05/03/21 2334   mirtazapine (REMERON) tablet 15 mg  15 mg Oral QHS Opyd, Ilene Qua, MD   15 mg at 05/03/21 2336   ondansetron (ZOFRAN) tablet 4 mg  4 mg Oral Q6H PRN Opyd, Ilene Qua, MD       Or   ondansetron (ZOFRAN) injection 4 mg  4 mg Intravenous Q6H PRN Opyd, Ilene Qua, MD       pantoprazole (PROTONIX) 80 mg /NS 100 mL IVPB  80 mg Intravenous Once Opyd, Ilene Qua, MD       [START ON 05/07/2021] pantoprazole (PROTONIX) injection 40 mg  40 mg Intravenous Q12H Opyd, Ilene Qua, MD       pantoprozole (PROTONIX) 80 mg /NS 100 mL infusion  8 mg/hr Intravenous Continuous Opyd, Ilene Qua, MD 10 mL/hr at 05/04/21 0200 8 mg/hr at 05/04/21 0200   sodium chloride flush (NS) 0.9 % injection 3 mL  3 mL Intravenous Q12H Opyd, Ilene Qua, MD       Current Outpatient Medications  Medication Sig Dispense Refill   acetaminophen (TYLENOL) 500 MG tablet Take 500 mg by  mouth every 6 (six) hours as needed for moderate pain (for pain.).     allopurinol (ZYLOPRIM) 300 MG tablet Take 300 mg by mouth daily.     amLODipine (NORVASC) 10 MG tablet TAKE 1/2 TABLET BY MOUTH DAILY AT BEDTIME; (TAKE 1 TABLET IF SBP IS >  150) (Patient taking differently: Take 10 mg by mouth daily.) 90 tablet 0   aspirin 81 MG chewable tablet Chew 81 mg by mouth daily.     donepezil (ARICEPT) 10 MG tablet Take 10 mg by mouth daily.     famotidine (PEPCID) 20 MG tablet Take 20 mg by mouth 2 (two) times daily.     labetalol (NORMODYNE) 200 MG tablet Take 200 mg by mouth 2 (two) times daily.     levothyroxine (SYNTHROID) 75 MCG tablet Take 75 mcg by mouth every morning.     memantine (NAMENDA) 10 MG tablet Take 1 tablet (10 mg total) by mouth 2 (two) times daily. 60 tablet 11   mirtazapine (REMERON) 15 MG tablet Take 15 mg by mouth at bedtime.     montelukast (SINGULAIR) 10 MG tablet Take 10 mg by mouth at bedtime.     pantoprazole (PROTONIX) 40 MG tablet Take 1 tablet (40 mg total) by mouth 2 (two) times daily before a meal. 60 tablet 0   traMADol (ULTRAM) 50 MG tablet Take 50 mg by mouth every 6 (six) hours as needed for moderate pain.      Allergies as of 05/03/2021 - Review Complete 05/03/2021  Allergen Reaction Noted   Codeine Anaphylaxis 12/28/2011   Penicillins Anaphylaxis 12/28/2011   Bactrim Itching and Swelling 12/28/2011   Clarithromycin Other (See Comments) 12/28/2011   Flagyl [metronidazole hcl] Itching and Swelling 12/28/2011   Food Other (See Comments) 03/24/2017   Pineapple Itching and Swelling 03/24/2017   Sulfa antibiotics Itching and Swelling 12/28/2011    Family History  Problem Relation Age of Onset   Stroke Father    Hypertension Father    Other Mother        died at early age   Stroke Sister    Coronary artery disease Brother    Hypertension Sister    Bladder Cancer Sister    Colon cancer Neg Hx    Stomach cancer Neg Hx     Social History    Socioeconomic History   Marital status: Single    Spouse name: Not on file   Number of children: 1   Years of education: college   Highest education level: Bachelor's degree (e.g., BA, AB, BS)  Occupational History   Occupation: retired    Fish farm manager: RETIRED  Tobacco Use   Smoking status: Former    Types: Cigarettes    Quit date: 10/02/1996    Years since quitting: 24.6   Smokeless tobacco: Never  Vaping Use   Vaping Use: Never used  Substance and Sexual Activity   Alcohol use: Yes    Comment: glass of wine at least once a month.   Drug use: No   Sexual activity: Not Currently    Partners: Male  Other Topics Concern   Not on file  Social History Narrative   Lives at home alone.   Right-handed.   No daily use of caffeine.   Social Determinants of Health   Financial Resource Strain: Not on file  Food Insecurity: Not on file  Transportation Needs: Not on file  Physical Activity: Not on file  Stress: Not on file  Social Connections: Not on file  Intimate Partner Violence: Not on file    Review of Systems: Gen: Denies fever, sweats or chills. No weight loss.  CV: Denies chest pain, palpitations or edema. Resp: Denies cough, shortness of breath of hemoptysis.  GI: See HPI GU : Denies urinary burning, blood in urine, increased urinary  frequency or incontinence. MS: Denies joint pain, muscles aches or weakness. Derm: Denies rash, itchiness, skin lesions or unhealing ulcers. Psych: + Mild dementia.  Denies depression or memory loss. Heme: Denies easy bruising, bleeding. Neuro:  Denies headaches, dizziness or paresthesias. Endo:  Denies any problems with DM, thyroid or adrenal function.  Physical Exam: Vital signs in last 24 hours: Temp:  [97.9 F (36.6 C)-98.6 F (37 C)] 98.1 F (36.7 C) (08/02 0307) Pulse Rate:  [58-81] 69 (08/02 0724) Resp:  [12-21] 15 (08/02 0724) BP: (107-137)/(65-98) 124/70 (08/02 0500) SpO2:  [97 %-100 %] 100 % (08/02 0724) Weight:   [62.6 kg] 62.6 kg (08/01 1800)   General:  Alert 80 year old female in no acute distress. Head:  Normocephalic and atraumatic. Eyes:  No scleral icterus. Conjunctiva pink. Ears:  Normal auditory acuity. Nose:  No deformity, discharge or lesions. Mouth:  Dentition intact. No ulcers or lesions.  Neck:  Supple. No lymphadenopathy or thyromegaly.  Lungs: Breath sounds clear throughout. Heart: Regular rate and rhythm, no murmurs. Abdomen: Soft, nondistended.  Nontender.  Positive bowel sounds all 4 quadrants. Rectal: Deferred.  Melenic stool per ED physician. + FOBT. Musculoskeletal:  Symmetrical without gross deformities.  Pulses:  Normal pulses noted. Extremities:  Without clubbing or edema. Neurologic:  Alert and  oriented x4. No focal deficits.  Skin:  Intact without significant lesions or rashes. Psych:  Alert and cooperative. Normal mood and affect.  Intake/Output from previous day: 08/01 0701 - 08/02 0700 In: 703.6 [I.V.:22.1; Blood:681.5] Out: 700 [Urine:700] Intake/Output this shift: No intake/output data recorded.  Lab Results: Recent Labs    05/03/21 1751 05/04/21 0414  WBC 6.0 5.6  HGB 5.8* 9.2*  HCT 18.0* 28.0*  PLT 92* 87*   BMET Recent Labs    05/03/21 1751 05/04/21 0414  NA 139 140  K 3.9 3.8  CL 111 111  CO2 22 23  GLUCOSE 99 84  BUN 43* 34*  CREATININE 2.46* 1.99*  CALCIUM 9.4 9.5   LFT Recent Labs    05/03/21 1751  PROT 7.1  ALBUMIN 3.8  AST 18  ALT 9  ALKPHOS 100  BILITOT 0.4  BILIDIR 0.1  IBILI 0.3    Studies/Results: CT HEAD WO CONTRAST (5MM)  Result Date: 05/03/2021 CLINICAL DATA:  Fall, syncope, dizziness, weakness EXAM: CT HEAD WITHOUT CONTRAST TECHNIQUE: Contiguous axial images were obtained from the base of the skull through the vertex without intravenous contrast. COMPARISON:  03/08/2016, 06/04/2020 FINDINGS: Brain: No evidence of acute infarction, hemorrhage, hydrocephalus, extra-axial collection or mass lesion/mass effect.  Extensive low-density changes within the periventricular and subcortical white matter compatible with chronic microvascular ischemic change. Mild diffuse cerebral volume loss. Vascular: Atherosclerotic calcifications involving the large vessels of the skull base. No unexpected hyperdense vessel. Skull: Normal. Negative for fracture or focal lesion. Sinuses/Orbits: No acute finding. Other: Negative for scalp hematoma. IMPRESSION: 1. No acute intracranial findings. 2. Chronic microvascular ischemic change and cerebral volume loss. Electronically Signed   By: Davina Poke D.O.   On: 05/03/2021 18:52    IMPRESSION/PLAN:  80 year old female with a history of myelodysplasia, UGI bleed 01/2021 and diverticular bleed s/p angiography with coil embolization of a colic branch of the SMA 03/2021 admitted to the hospital 05/03/2021 with recurrent upper GI bleed/melena. Admission Hg 5.8 ( Hg 9.8 on 6/14). BUN 99 (baseline BUN 96 on 6/14).  Transfused 2 units PRBCs.  Hemoglobin stable this morning at 9.3.  No further melenic stools since admission.  She is hemodynamically stable. -  NPO -Small bowel enteroscopy this am with Dr. Fuller Plan.  Enteroscopy benefits and risks discussed including risk with sedation, risk of bleeding, perforation and infection  -Continue Pantoprazole 8 mg an hour infusion -IV fluids per the hospitalist -Monitor H&H close -Monitor patient for active GI bleeding -Further recommendations to be determined after enteroscopy completed  Acute on chronic anemia, multifactorial: CKD and recurrent GI bleed in setting of myelodysplasia and thrombocytopenia (PLT 88). She is on Aranasep Q 4 weeks and PRBC transfusions as needed followed by hematologist Dr. Alen Blew  AKI on CKD. Creatinine 2.46 (Cr 1.59 on 6/14).   -IV fluids per the hospitalist    Susan [name]  05/04/2021, 08:59Am     Attending Physician Note   I have taken a history, reviewed the chart and examined the patient. I personally  saw the patient and performed a substantive portion of this encounter, including a complete performance of at least one of the key components, in conjunction with the APP. I agree with the APP's note, impression and recommendations.   Impression/Recommendation:  Acute onset of melena with history of a possible UGI bleed in April and diverticular bleed s/p angiography with coil embolization of a colic artery branch of the SMA in June. No further stools since admission. She is hemodynamically stable. Possible recurrent diverticular bleed. R/O UGI tract source. SBE today to further evaluate.   Anemia -  ABL on chronic anemia related to myelodysplasia, CKD. Admission Hgb=5.8. Transfused 2 units PRBCs and Hgb=9.3 this morning.  Trend CBC.  AKI on CKD   Lucio Edward, MD [facility] See Shea Evans, [facility], for our on call provider

## 2021-05-04 NOTE — Progress Notes (Signed)
Pt's BP much improved. Dr. Valma Cava at bedside to re-eval. Report called to [facility] on floor. Pt cleared to return to room. NAD. No complaints voiced.

## 2021-05-04 NOTE — Interval H&P Note (Signed)
History and Physical Interval Note:  05/04/2021 9:39 AM  Susan [name]  has presented today for surgery, with the diagnosis of GI bleed, melena, anemia.  The various methods of treatment have been discussed with the patient and family. After consideration of risks, benefits and other options for treatment, the patient has consented to  Procedure(s): ENTEROSCOPY (N/A) as a surgical intervention.  The patient's history has been reviewed, patient examined, no change in status, stable for surgery.  I have reviewed the patient's chart and labs.  Questions were answered to the patient's satisfaction.     Pricilla Riffle. Fuller Plan

## 2021-05-04 NOTE — Anesthesia Procedure Notes (Signed)
Date/Time: 05/04/2021 10:44 AM Performed by: Sharlette Dense, CRNA Oxygen Delivery Method: Simple face mask

## 2021-05-04 NOTE — Evaluation (Signed)
Physical Therapy Evaluation Patient Details Name: Susan [name] MRN: [id number] DOB: 09/11/1941 Today's Date: 05/04/2021   History of Present Illness  80 y.o. female with medical history significant for myelodysplasia, hypertension, hypothyroidism, and memory loss, now presented to ED  reporting black stool with clots and a syncopal episode yesterday.  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  Pt agreeable to amb, fatigues easily (much more rapidly than her baseline per pt). Will continue to follow in acute setting. Recommend HHPT   Pt currently with functional limitations due to the deficits listed below (see PT Problem List). Pt will benefit from skilled PT to increase their independence and safety with mobility to allow discharge to the venue listed below.       Follow Up Recommendations Home health PT    Equipment Recommendations  None recommended by PT    Recommendations for Other Services       Precautions / Restrictions Precautions Precautions: Fall Restrictions Weight Bearing Restrictions: No      Mobility  Bed Mobility Overal bed mobility: Needs Assistance Bed Mobility: Supine to Sit;Sit to Supine     Supine to sit: Supervision Sit to supine: Supervision   General bed mobility comments: for safety/lines, incr time    Transfers Overall transfer level: Needs assistance Equipment used: Rolling walker (2 wheeled) Transfers: Sit to/from [facility] Sit to Stand: Min guard Stand pivot transfers: Min guard       General transfer comment: cues for hand placement, to control descent  Ambulation/Gait Ambulation/Gait assistance: Min guard;Min assist Gait Distance (Feet): 80 Feet Assistive device: Rolling walker (2 wheeled) Gait Pattern/deviations: Step-through pattern;Decreased stride length;Trunk flexed;Narrow base of support Gait  velocity: decr   General Gait Details: min to min/guard to steady with turns, fatigues easily  Stairs            Wheelchair Mobility    Modified Rankin (Stroke Patients Only)       Balance Overall balance assessment: Needs assistance Sitting-balance support: No upper extremity supported;Feet supported Sitting balance-Leahy Scale: Good       Standing balance-Leahy Scale: Fair                               Pertinent Vitals/Pain Pain Assessment: No/denies pain    Home Living Family/patient expects to be discharged to:: Private residence Living Arrangements: Alone Available Help at Discharge: Friend(s);Personal care attendant;Available PRN/intermittently Type of Home: Apartment (senior living) Home Access: Level entry     Home Layout: One level Home Equipment: Cane - single point;Shower seat;Walker - 2 wheels;Walker - 4 wheels;Grab bars - tub/shower;Wheelchair - manual      Prior Function Level of Independence: Independent with assistive device(s)         Comments: Pt reports using SPC for household and community ambulation. independent with ADLs, has personal aide that assists with household chores as needed, denies falls. 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, water quality        Extremity/Trunk Assessment   Upper Extremity Assessment Upper Extremity Assessment: Overall WFL for tasks assessed    Lower Extremity Assessment Lower Extremity Assessment: Overall WFL for tasks assessed       Communication   Communication: No difficulties  Cognition Arousal/Alertness: Awake/alert Behavior During Therapy: WFL for tasks assessed/performed Overall Cognitive Status: No family/caregiver present to determine baseline cognitive  functioning                                 General Comments: hx memory loss      General Comments      Exercises     Assessment/Plan    PT Assessment Patient needs continued PT services  PT  Problem List Decreased mobility;Decreased activity tolerance;Decreased balance       PT Treatment Interventions DME instruction;Therapeutic activities;Gait training;Functional mobility training;Therapeutic exercise;Patient/family education    PT Goals (Current goals can be found in the Care Plan section)  Acute Rehab PT Goals Patient Stated Goal: get some food PT Goal Formulation: With patient Time For Goal Achievement: 05/18/21 Potential to Achieve Goals: Good    Frequency Min 3X/week   Barriers to discharge        Co-evaluation               AM-PAC PT "6 Clicks" Mobility  Outcome Measure Help needed turning from your back to your side while in a flat bed without using bedrails?: None Help needed moving from lying on your back to sitting on the side of a flat bed without using bedrails?: A Little Help needed moving to and from a bed to a chair (including a wheelchair)?: A Little Help needed standing up from a chair using your arms (e.g., wheelchair or bedside chair)?: A Little Help needed to walk in hospital room?: A Little Help needed climbing 3-5 steps with a railing? : A Lot 6 Click Score: 18    End of Session Equipment Utilized During Treatment: Gait belt Activity Tolerance: Patient tolerated treatment well;Patient limited by fatigue Patient left: with call bell/phone within reach;in bed (no alarm on arrival)   PT Visit Diagnosis: Other abnormalities of gait and mobility (R26.89);Difficulty in walking, not elsewhere classified (R26.2)    Time: 9678-9381 PT Time Calculation (min) (ACUTE ONLY): 18 min   Charges:   PT Evaluation $PT Eval Low Complexity: [place], PT  Acute Rehab Dept ([place]) [phone] Pager [phone]  05/04/2021   [facility] 05/04/2021, 4:56 PM

## 2021-05-04 NOTE — Progress Notes (Signed)
PROGRESS NOTE    Susan [name]  TJQ:[id number] DOB: [birth year] DOA: 05/03/2021 PCP: Sonia Side., [name]     Brief Narrative:  Susan [name] is a 80 y.o. female with medical history significant for myelodysplasia, hypertension, hypothyroidism, and memory loss, now presenting to the emergency department reporting black stool with clots and a syncopal episode yesterday.  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  She reports no bleeding until 04/30/2021 when she noted her stool was black.  She has continued to have black stool, also some clots, and had a ?syncopal episode yesterday when she was getting dressed for church.  Denies any aspirin or NSAID use recently.  New events last 24 hours / Subjective: This morning, patient denies having any further melanotic stools since admission.  Denies any abdominal pain, chest pain, palpitations, shortness of breath, cough.  Had some dizziness and lightheadedness as well as weakness prior to admission.  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  Treated with APC -Continue IV PPI twice daily for  now, transition to p.o. daily at discharge -Clear liquid diet -Trend CBC  Possibly syncope -Patient denies losing consciousness on my examination today -Continue telemetry  AKI on CKD stage IIIb -Baseline creatinine 1.4 -Improving, continue IVF, continue to trend BMP  MDS, thrombocytopenia -Continue to monitor CBC  Hypothyroidism -Continue Synthroid  Mild memory loss -Delirium precaution -Continue aricept, namenda, remeron   Dysuria -Urine culture is pending -Empiric Cipro   DVT prophylaxis:  SCDs Start: 05/03/21 1957  Code Status:     Code Status Orders  (From admission, onward)           Start     Ordered   05/03/21 1957  Full code  Continuous        05/03/21 1958           Code Status History     Date Active Date Inactive Code Status Order ID Comments User Context   03/12/2021 2232 03/16/2021 1708 Full Code [id number]  Rhetta Mura, DO ED   01/08/2021 1537 01/10/2021 1836 Full Code [id number]  Edwin Dada, MD Inpatient      Family Communication: None at bedside Disposition Plan:  Status is: Inpatient  Remains inpatient appropriate because:Inpatient level of care appropriate due to severity of illness  Dispo: The patient is from: Home              Anticipated d/c is to: Home              Patient currently is not medically stable to d/c.   Difficult to place patient No       Antimicrobials:  Anti-infectives (  From admission, onward)    Start     Dose/Rate Route Frequency Ordered Stop   05/04/21 1000  ciprofloxacin (CIPRO) IVPB 400 mg        400 mg 200 mL/hr over 60 Minutes Intravenous Every 24 hours 05/04/21 0900          Objective: Vitals:   05/04/21 1030 05/04/21 1040 05/04/21 1050 05/04/21 1120  BP: (!) 89/54 106/63 121/65 126/76  Pulse: 62 60 62 60  Resp: 14 15 15 18   Temp: 97.8 F (36.6 C)   (!) 97.5 F (36.4 C)  TempSrc: Oral   Oral  SpO2: 100% 100% 99% 98%  Weight:      Height:        Intake/Output  Summary (Last 24 hours) at 05/04/2021 1214 Last data filed at 05/04/2021 1031 Gross per 24 hour  Intake 1203.63 ml  Output 1050 ml  Net 153.63 ml   Filed Weights   05/03/21 1800  Weight: 62.6 kg    Examination:  General exam: Appears calm and comfortable  Respiratory system: Clear to auscultation. Respiratory effort normal. No respiratory distress. No conversational dyspnea.  Cardiovascular system: S1 & S2 heard, RRR. No murmurs. No pedal edema. Gastrointestinal system: Abdomen is nondistended, soft and nontender. Normal bowel sounds heard. Central nervous system: Alert and oriented. No focal neurological deficits. Speech clear.  Extremities: Symmetric in appearance  Skin: No rashes, lesions or ulcers on exposed skin  Psychiatry: Judgement and insight appear normal. Mood & affect appropriate.   Data Reviewed: I have personally reviewed following labs and imaging studies  CBC: Recent Labs  Lab 05/03/21 1751 05/04/21 0414 05/04/21 0805  WBC 6.0 5.6 6.6  NEUTROABS 4.0  --   --   HGB 5.8* 9.2* 9.3*  HCT 18.0* 28.0* 27.7*  MCV 82.6 79.8* 79.4*  PLT 92* 87* 88*   Basic Metabolic Panel: Recent Labs  Lab 05/03/21 1751 05/04/21 0414  NA 139 140  K 3.9 3.8  CL 111 111  CO2 22 23  GLUCOSE 99 84  BUN 43* 34*  CREATININE 2.46* 1.99*  CALCIUM 9.4 9.5   GFR: Estimated Creatinine Clearance: 19.3 mL/min (A) (by C-G formula based on SCr of 1.99 mg/dL (H)). Liver Function Tests: Recent Labs  Lab 05/03/21 1751  AST 18  ALT 9  ALKPHOS 100  BILITOT 0.4  PROT 7.1  ALBUMIN 3.8   No results for input(s): LIPASE, AMYLASE in the last 168 hours. No results for input(s): AMMONIA in the last 168 hours. Coagulation Profile: No results for input(s): INR, PROTIME in the last 168 hours. Cardiac Enzymes: No results for input(s): CKTOTAL, CKMB, CKMBINDEX, TROPONINI in the last 168 hours. BNP (last 3 results) No results for input(s): PROBNP in the last 8760 hours. HbA1C: No results  for input(s): HGBA1C in the last 72 hours. CBG: No results for input(s): GLUCAP in the last 168 hours. Lipid Profile: No results for input(s): CHOL, HDL, LDLCALC, TRIG, CHOLHDL, LDLDIRECT in the last 72 hours. Thyroid Function Tests: No results for input(s): TSH, T4TOTAL, FREET4, T3FREE, THYROIDAB in the last 72 hours. Anemia Panel: No results for input(s): VITAMINB12, FOLATE, FERRITIN, TIBC, IRON, RETICCTPCT in the last 72 hours. Sepsis Labs: No results for input(s): PROCALCITON, LATICACIDVEN in the last 168 hours.  Recent Results (from the past 240 hour(s))  Resp Panel by RT-PCR (Flu A&B, Covid) Nasopharyngeal Swab     Status: None   Collection Time: 05/03/21  7:57 PM   Specimen: Nasopharyngeal Swab; Nasopharyngeal(NP) swabs in  vial transport medium  Result Value Ref Range Status   SARS Coronavirus 2 by RT PCR NEGATIVE NEGATIVE Final    Comment: (NOTE) SARS-CoV-2 target nucleic acids are NOT DETECTED.  The SARS-CoV-2 RNA is generally detectable in upper respiratory specimens during the acute phase of infection. The lowest concentration of SARS-CoV-2 viral copies this assay can detect is 138 copies/mL. A negative result does not preclude SARS-Cov-2 infection and should not be used as the sole basis for treatment or other patient management decisions. A negative result may occur with  improper specimen collection/handling, submission of specimen other than nasopharyngeal swab, presence of viral mutation(s) within the areas targeted by this assay, and inadequate number of viral copies(<138 copies/mL). A negative result must be combined with clinical observations, patient history, and epidemiological information. The expected result is Negative.  Fact Sheet for Patients:  EntrepreneurPulse.com.au  Fact Sheet for Healthcare Providers:  IncredibleEmployment.be  This test is no t yet approved or cleared by the [place] FDA and  has been  authorized for detection and/or diagnosis of SARS-CoV-2 by FDA under an Emergency Use Authorization (EUA). This EUA will remain  in effect (meaning this test can be used) for the duration of the COVID-19 declaration under Section 564(b)(1) of the Act, 21 U.S.C.section 360bbb-3(b)(1), unless the authorization is terminated  or revoked sooner.       Influenza A by PCR NEGATIVE NEGATIVE Final   Influenza B by PCR NEGATIVE NEGATIVE Final    Comment: (NOTE) The Xpert Xpress SARS-CoV-2/FLU/RSV plus assay is intended as an aid in the diagnosis of influenza from Nasopharyngeal swab specimens and should not be used as a sole basis for treatment. Nasal washings and aspirates are unacceptable for Xpert Xpress SARS-CoV-2/FLU/RSV testing.  Fact Sheet for Patients: EntrepreneurPulse.com.au  Fact Sheet for Healthcare Providers: IncredibleEmployment.be  This test is not yet approved or cleared by the [place] FDA and has been authorized for detection and/or diagnosis of SARS-CoV-2 by FDA under an Emergency Use Authorization (EUA). This EUA will remain in effect (meaning this test can be used) for the duration of the COVID-19 declaration under Section 564(b)(1) of the Act, 21 U.S.C. section 360bbb-3(b)(1), unless the authorization is terminated or revoked.  Performed at [facility], [place] [address] [place], [place] [address]       Radiology Studies: CT HEAD WO CONTRAST (5MM)  Result Date: 05/03/2021 CLINICAL DATA:  Fall, syncope, dizziness, weakness EXAM: CT HEAD WITHOUT CONTRAST TECHNIQUE: Contiguous axial images were obtained from the base of the skull through the vertex without intravenous contrast. COMPARISON:  03/08/2016, 06/04/2020 FINDINGS: Brain: No evidence of acute infarction, hemorrhage, hydrocephalus, extra-axial collection or mass lesion/mass effect. Extensive low-density changes within the periventricular and subcortical  white matter compatible with chronic microvascular ischemic change. Mild diffuse cerebral volume loss. Vascular: Atherosclerotic calcifications involving the large vessels of the skull base. No unexpected hyperdense vessel. Skull: Normal. Negative for fracture or focal lesion. Sinuses/Orbits: No acute finding. Other: Negative for scalp hematoma. IMPRESSION: 1. No acute intracranial findings. 2. Chronic microvascular ischemic change and cerebral volume loss. Electronically Signed   By: Davina Poke D.O.   On: 05/03/2021 18:52      Scheduled Meds:  sodium chloride   Intravenous Once   donepezil  10 mg Oral Daily   levothyroxine  75 mcg Oral Q0600   memantine  10 mg Oral BID   mirtazapine  15 mg Oral QHS   pantoprazole (PROTONIX) IV  40 mg Intravenous Q12H  sodium chloride flush  3 mL Intravenous Q12H   Continuous Infusions:  sodium chloride 75 mL/hr at 05/04/21 9292   ciprofloxacin 400 mg (05/04/21 1146)     LOS: 1 day      Time spent: 30 minutes   Dessa Phi, DO [facility] 05/04/2021, 12:14 PM   Available via Epic secure chat 7am-7pm After these hours, please refer to coverage provider listed on [email or web address]

## 2021-05-05 ENCOUNTER — Encounter (HOSPITAL_COMMUNITY): Payer: Self-pay | Admitting: Gastroenterology

## 2021-05-05 DIAGNOSIS — K922 Gastrointestinal hemorrhage, unspecified: Secondary | ICD-10-CM | POA: Diagnosis not present

## 2021-05-05 LAB — TYPE AND SCREEN
ABO/RH(D): O POS
Antibody Screen: POSITIVE
DAT, IgG: NEGATIVE
Donor AG Type: NEGATIVE
Donor AG Type: NEGATIVE
Unit division: 0
Unit division: 0

## 2021-05-05 LAB — CBC
HCT: 28.1 % — ABNORMAL LOW (ref 36.0–46.0)
Hemoglobin: 9.4 g/dL — ABNORMAL LOW (ref 12.0–15.0)
MCH: 26.6 pg (ref 26.0–34.0)
MCHC: 33.5 g/dL (ref 30.0–36.0)
MCV: 79.6 fL — ABNORMAL LOW (ref 80.0–100.0)
Platelets: 81 10*3/uL — ABNORMAL LOW (ref 150–400)
RBC: 3.53 MIL/uL — ABNORMAL LOW (ref 3.87–5.11)
RDW: 18.1 % — ABNORMAL HIGH (ref 11.5–15.5)
WBC: 8.1 10*3/uL (ref 4.0–10.5)
nRBC: 0 % (ref 0.0–0.2)

## 2021-05-05 LAB — BASIC METABOLIC PANEL
Anion gap: 8 (ref 5–15)
BUN: 20 mg/dL (ref 8–23)
CO2: 23 mmol/L (ref 22–32)
Calcium: 9.7 mg/dL (ref 8.9–10.3)
Chloride: 109 mmol/L (ref 98–111)
Creatinine, Ser: 1.5 mg/dL — ABNORMAL HIGH (ref 0.44–1.00)
GFR, Estimated: 35 mL/min — ABNORMAL LOW (ref >60)
Glucose, Bld: 93 mg/dL (ref 70–99)
Potassium: 3.7 mmol/L (ref 3.5–5.1)
Sodium: 140 mmol/L (ref 135–145)

## 2021-05-05 LAB — BPAM RBC
Blood Product Expiration Date: 202208272359
Blood Product Expiration Date: 202208282359
ISSUE DATE / TIME: 202208012102
ISSUE DATE / TIME: 202208020026
Unit Type and Rh: 9500
Unit Type and Rh: 9500

## 2021-05-05 LAB — SURGICAL PATHOLOGY

## 2021-05-05 MED ORDER — FLUCONAZOLE 100 MG PO TABS
200.0000 mg | ORAL_TABLET | Freq: Once | ORAL | Status: AC
  Administered 2021-05-05: 200 mg via ORAL
  Filled 2021-05-05: qty 2

## 2021-05-05 MED ORDER — FLUCONAZOLE 100 MG PO TABS
100.0000 mg | ORAL_TABLET | Freq: Every day | ORAL | Status: DC
  Administered 2021-05-06: 100 mg via ORAL
  Filled 2021-05-05: qty 1

## 2021-05-05 MED ORDER — FLUCONAZOLE 100 MG PO TABS: 400.0000 mg | ORAL_TABLET | Freq: Once | ORAL | Status: DC

## 2021-05-05 MED ORDER — ACETAMINOPHEN 325 MG PO TABS
650.0000 mg | ORAL_TABLET | ORAL | Status: DC | PRN
  Administered 2021-05-05: 650 mg via ORAL
  Filled 2021-05-05: qty 2

## 2021-05-05 MED ORDER — POLYETHYLENE GLYCOL 3350 17 G PO PACK
17.0000 g | PACK | Freq: Every day | ORAL | Status: DC
  Administered 2021-05-05 – 2021-05-06 (×2): 17 g via ORAL
  Filled 2021-05-05 (×2): qty 1

## 2021-05-05 MED ORDER — FLUCONAZOLE 100 MG PO TABS: 200.0000 mg | ORAL_TABLET | Freq: Every day | ORAL | Status: DC

## 2021-05-05 NOTE — Progress Notes (Signed)
PHARMACY NOTE:  ANTIMICROBIAL RENAL DOSAGE ADJUSTMENT  Current antimicrobial regimen includes a mismatch between antimicrobial dosage and estimated renal function.  As per policy approved by the Pharmacy & Therapeutics and Medical Executive Committees, the antimicrobial dosage will be adjusted accordingly.  Current antimicrobial dosage:  Fluconazole 400 mg PO loading dose followed by 200 mg PO daily x13 days  Indication: Candida esophagitis  Renal Function:  Estimated Creatinine Clearance: 25.6 mL/min (A) (by C-G formula based on SCr of 1.5 mg/dL (H)).    Antimicrobial dosage has been changed to:  50% dose reduction for CrCl < 50 mL/min  Fluconazole 200 mg PO loading dose followed  by 100 mg PO daily x13 days  Thank you for allowing pharmacy to be a part of this patient's care.  [place], [facility] 05/05/2021 4:28 PM

## 2021-05-05 NOTE — Progress Notes (Signed)
MD notified that patient complained of should pain and has nothing PRN ordered. MD added Tylenol PRN.

## 2021-05-05 NOTE — Evaluation (Signed)
Occupational Therapy Evaluation Patient Details Name: Susan [name] MRN: [id number] DOB: 06-21-1941 Today's Date: 05/05/2021    History of Present Illness 80 y.o. female with medical history significant for myelodysplasia, hypertension, hypothyroidism, and memory loss, now presented to ED  reporting black stool with clots and a syncopal episode yesterday.  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 On evaluation she demonstrates ability to perform bed transfers, ambulate without DME in room and perform ADLs without assistance. Patient had no overt loss of balance. Patient alert to self, year, situation, and place. She was close on the date. Patient appears to be at her baseline in regards to functional abilities. She reports she has a neighbor a block away that looks in on her. Would recommend that someone stay with her the first night to make sure all is well at night since she lives alone due to last night's episode of confusion.  No OT needs at this time.    Follow Up Recommendations  No OT follow up    Equipment Recommendations  None recommended by OT    Recommendations for Other Services       Precautions / Restrictions Precautions Precautions: Fall Restrictions Weight Bearing Restrictions: No      Mobility Bed Mobility Overal bed mobility: Modified Independent                  Transfers Overall transfer level: Needs assistance Equipment used: None Transfers: Sit to/from Stand;Stand Pivot Transfers Sit to Stand: Supervision Stand pivot transfers: Supervision       General transfer comment: supervision to stand and ambulate to bathroom, perform toielt transfer, stand at sink and ambulate back to recliner. No overt loss of balance. No unsafe behaviors.    Balance Overall balance assessment: Mild deficits  observed, not formally tested   Sitting balance-Leahy Scale: Good       Standing balance-Leahy Scale: Fair                             ADL either performed or assessed with clinical judgement   ADL Overall ADL's : Independent                                             Vision Patient Visual Report: No change from baseline       Perception     Praxis      Pertinent Vitals/Pain Pain Assessment: No/denies pain     Hand Dominance Right   Extremity/Trunk Assessment Upper Extremity Assessment Upper Extremity Assessment: Overall WFL for tasks assessed   Lower Extremity Assessment Lower Extremity Assessment: Overall WFL for tasks assessed   Cervical / Trunk Assessment Cervical / Trunk Assessment: Normal   Communication Communication Communication: No difficulties   Cognition Arousal/Alertness: Awake/alert Behavior During Therapy: WFL for tasks assessed/performed Overall Cognitive Status: Within Functional Limits for tasks assessed                                 General Comments: hx memory loss. Alert to self, hospital, year and close to month.   General Comments  Exercises     Shoulder Instructions      Home Living Family/patient expects to be discharged to:: Private residence Living Arrangements: Alone Available Help at Discharge: Friend(s);Personal care attendant;Available PRN/intermittently Type of Home: Apartment (senior living) Home Access: Level entry     Home Layout: One level               Home Equipment: Cane - single point;Shower seat;Walker - 2 wheels;Walker - 4 wheels;Grab bars - tub/shower;Wheelchair - manual          Prior Functioning/Environment Level of Independence: Independent with assistive device(s)        Comments: Pt reports using SPC for household and community ambulation. independent with ADLs, has personal aide that assists with household chores as needed, denies  falls. states she does laundry everyday        OT Problem List:        OT Treatment/Interventions:      OT Goals(Current goals can be found in the care plan section) Acute Rehab OT Goals Patient Stated Goal: go home OT Goal Formulation: All assessment and education complete, DC therapy  OT Frequency:     Barriers to D/C:            Co-evaluation              AM-PAC OT "6 Clicks" Daily Activity     Outcome Measure Help from another person eating meals?: None Help from another person taking care of personal grooming?: None Help from another person toileting, which includes using toliet, bedpan, or urinal?: None Help from another person bathing (including washing, rinsing, drying)?: None Help from another person to put on and taking off regular upper body clothing?: None Help from another person to put on and taking off regular lower body clothing?: None 6 Click Score: 24   End of Session Nurse Communication:  (okay to see)  Activity Tolerance: Patient tolerated treatment well Patient left: in chair;with call bell/phone within reach;with chair alarm set  OT Visit Diagnosis: Muscle weakness (generalized) (M62.81)                Time: 1425-1440 OT Time Calculation (min): 15 min Charges:  OT General Charges $OT Visit: 1 Visit OT Evaluation $OT Eval Low Complexity: 1 Low  Madgie Dhaliwal, OTR/L [place]  Office [phone] Pager: [place] 05/05/2021, 3:00 PM

## 2021-05-05 NOTE — Progress Notes (Signed)
[facility]  Pathology results returned showing Candida esophagitis.  Recommend treatment with Fluconazole 400 mg on day 1 then 200 mg daily for 13 more days.  We will put orders in the chart.  Patient will need to be discharged home with Fluconazole.  Ellouise Newer, PA-C

## 2021-05-05 NOTE — Progress Notes (Addendum)
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  This morning patient tells me she is doing well and has no complaints.  She has not had a bowel movement since time of admission and tells me she requires milk of magnesia at home.  Does tell me she feels somewhat bloated and distended in her abdomen.  Per nursing staff no further melena.   Objective   Vital signs in last 24 hours: Temp:  [97.5 F (36.4 C)-98.6 F (37 C)] 98 F (36.7 C) (08/03 0621) Pulse Rate:  [60-90] 66 (08/03 0621) Resp:  [13-18] 16 (08/03 0621) BP: (89-141)/(54-87) 118/68 (08/03 0621) SpO2:  [98 %-100 %] 99 % (08/03 0621) Last BM Date:  (PTA) General:    AA female in NAD Heart:  Regular rate and rhythm; no murmurs Lungs: Respirations even and unlabored, lungs CTA bilaterally Abdomen:  Soft, nontender and nondistended. Normal bowel sounds. Psych:  Cooperative. Normal mood and affect.  Intake/Output from previous day: 08/02 0701 - 08/03 0700 In: 1300.6 [P.O.:480; I.V.:620.6; IV Piggyback:200] Out: 1350 [Urine:1350]   Lab Results: Recent Labs    05/04/21 0414 05/04/21 0805 05/05/21 0513  WBC 5.6 6.6 8.1  HGB 9.2* 9.3* 9.4*  HCT 28.0* 27.7* 28.1*  PLT 87* 88* 81*   BMET Recent Labs    05/03/21 1751 05/04/21 0414 05/05/21 0513  NA 139 140 140  K 3.9 3.8 3.7  CL 111 111 109  CO2 22 23 23   GLUCOSE 99 84 93  BUN 43* 34* 20  CREATININE 2.46* 1.99* 1.50*  CALCIUM 9.4 9.5 9.7   LFT Recent Labs    05/03/21 1751  PROT 7.1  ALBUMIN 3.8  AST 18  ALT 9  ALKPHOS 100  BILITOT 0.4  BILIDIR 0.1  IBILI 0.3    Studies/Results: CT HEAD WO CONTRAST (5MM)  Result Date: 05/03/2021 CLINICAL DATA:  Fall, syncope, dizziness, weakness EXAM: CT HEAD WITHOUT CONTRAST TECHNIQUE: Contiguous axial images were obtained from the base of the skull through the vertex without  intravenous contrast. COMPARISON:  03/08/2016, 06/04/2020 FINDINGS: Brain: No evidence of acute infarction, hemorrhage, hydrocephalus, extra-axial collection or mass lesion/mass effect. Extensive low-density changes within the periventricular and subcortical white matter compatible with chronic microvascular ischemic change. Mild diffuse cerebral volume loss. Vascular: Atherosclerotic calcifications involving the large vessels of the skull base. No unexpected hyperdense vessel. Skull: Normal. Negative for fracture or focal lesion. Sinuses/Orbits: No acute finding. Other: Negative for scalp hematoma. IMPRESSION: 1. No acute intracranial findings. 2. Chronic microvascular ischemic change and cerebral volume loss. Electronically Signed   By: Davina Poke D.O.   On: 05/03/2021 18:52    EGD 05/04/2021 with Dr. Fuller Plan Impression: - Esophageal plaques were found, suspicious for                           candidiasis. Biopsied.                           - A single spot with bleeding in the gastric                           cardia. Treated with argon plasma coagulation (APC).                           -  Small hiatal hernia.                           - Gastritis. Biopsied.                           - Normal examined duodenum.                           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 Treated with argon plasma                           coagulation (APC).                           - A tattoo was placed in the proximal jejunum at                           the distal extent of the exam. The tattoo site                           appeared normal. Recommendation:           - Return patient to [facility] for ongoing care.                           - Gastric cardia and proximal jejunum findings are                           potential sources of acute bleed.                           - Clear liquid diet for now.                           - Await  pathology results.                           - Change pantoprazole to 40 mg IV bid for now and                           then PO qd at discharge.                           - Otherwise continue present medications.                           - Outpatient GI follow up with Dr. Silverio Decamp.   Assessment / Plan:   Assessment: 1.  Upper GI bleed: History of myelodysplasia, upper GI bleed 4/22 and diverticular bleed status post angiography with coil embolization of a colic branch of the SMA 6/22 admitted to the hospital 05/03/2021 with recurrent upper GI bleed/melena, admission hemoglobin 5.8--> 2 units PRBCs--> 9.3--> 9.4 overnight, enteroscopy yesterday with APC of a jejunal AVM  and a bleeding gastric cardia thought to be the source 2.  Acute on chronic anemia: Multifactorial with history of CKD and recurrent GI bleeding in setting of myelodysplasia and thrombocytopenia, currently on Aranasep every 4 weeks and PRBC transfusions as needed followed by hematologist Dr. Alen Blew 3.  AKI on CKD   Plan: 1.  Continue Pantoprazole 40 mg IV twice daily for now and changed to p.o. daily at discharge 2.  Pathology results are pending but patient may benefit from treatment of presumed candidiasis, we will alert patient when we have results 3.  Continue to monitor hemoglobin with transfusion as needed less than 7 while patient is hospitalized 4. Added Miralax qd 5.  We will arrange for follow-up with Dr. Silverio Decamp.  We will likely sign off.  Thank you for your kind consultation.   LOS: 2 days   Levin Erp  05/05/2021, 10:03 AM     Attending Physician Note   I have taken an interval history, reviewed the chart and examined the patient. I personally saw the patient and performed a substantive portion of this encounter, including a complete performance of at least one of the key components, in conjunction with the APP. I agree with the APP's note, impression and recommendations.   Resolved GI bleed felt  secondary to gastric cardia lesion and/or jejunal AVM. Soft diet for now and resume prior diet in 3 days. Change to pantoprazole 40 mg po qd at discharge. Avoid ASA/NSAIDs for 1 week.   R/O gastritis and esophageal candidiasis. Biopsies pending.   ABL on chronic anemia. As above.   OK for discharge from GI standpoint. Outpatient GI follow up with Dr. Silverio Decamp. Outpatient follow up with Dr. Alen Blew as planned. GI signing off.    Lucio Edward, MD [facility], [facility], for our on call provider

## 2021-05-05 NOTE — Progress Notes (Signed)
Progress Note    Ikram Riebe Ley   YIR:[id number]  DOB: 05/19/1941  DOA: 05/03/2021     2  PCP: Sonia Side., [name]  CC: dark stools  Hospital Course: Ellawyn Wogan Carrick is a 80 y.o. female with medical history significant for myelodysplasia, hypertension, hypothyroidism, and memory loss, now presenting to the emergency department reporting black stool with clots and a syncopal episode.  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  She reports no bleeding until 04/30/2021 when she noted her stool was black.  She has continued to have black stool, also some clots, and had a ?syncopal episode when she was getting dressed for church.  Denies any aspirin or NSAID use recently.  She underwent EGD on 05/04/21: "single localized spot with bleeding was found in the cardia. Focal bleeding gastritis or possible AVM. Coagulation for hemostasis using argon plasma (gastric settings) was successful.Diffuse moderate inflammation characterized by congestion (edema), erythema and granularity was found in the gastric fundus and in the gastric body."  Interval History:  No events overnight. Resting comfortably in bed this am.   ROS: Constitutional: negative for chills and fevers, Respiratory: negative for cough and sputum, Cardiovascular: negative for chest pain, and Gastrointestinal: negative for abdominal pain  Assessment & Plan: Acute GI bleed -Transfused 2 unit packed red blood cells 8/1 -GI consulted, status post enteroscopy 8/2 which showed angiodysplastic lesion in gastric cardia and proximal jejunum.  Treated with APC -Continue IV PPI twice daily for now, transition to p.o. daily at  discharge - Monitor Hgb futher overnight; if remains stable will d/c home Thus  Candida esophagitis - Pathology results returned from EGD - Started on fluconazole for total of 14-day course per GI  Possibly syncope -Continue telemetry  AKI on CKD stage IIIb -Baseline creatinine 1.4 - improved with IVF  MDS, thrombocytopenia -Continue to monitor CBC  Hypothyroidism -Continue Synthroid  Mild memory loss -Delirium precaution -Continue aricept, namenda, remeron   Dysuria -Urine culture is pending -Empiric Cipro    Old records reviewed in assessment of this patient  Antimicrobials:   DVT prophylaxis: SCDs Start: 05/03/21 1957   Code Status:   Code Status: Full Code Family Communication:   Disposition Plan: Status is: Inpatient  Remains inpatient appropriate because:Inpatient level of care appropriate due to severity of illness  Dispo: The patient is from: Home              Anticipated d/c is to: Home              Patient currently is not medically stable to d/c.   Difficult to place patient No      Risk of unplanned readmission score: Unplanned Admission- Pilot do not use: 18.32   Objective: Blood pressure 137/74, pulse 67, temperature 97.8 F (36.6 C), temperature source Oral, resp. rate 16, height 5\' 2"  (1.575 m), weight 62.6 kg, SpO2 100 %.  Examination: General appearance: alert, cooperative, and no distress Head: Normocephalic, without obvious abnormality, atraumatic Eyes:  EOMI Lungs: clear to auscultation bilaterally Heart: regular rate and rhythm and S1, S2 normal Abdomen: normal findings: bowel sounds normal and soft, non-tender Extremities:  no edema Skin: mobility and turgor normal Neurologic:  Grossly normal  Consultants:  GI  Procedures:  EGD, 8/2  Data Reviewed: I have personally reviewed following labs and imaging studies Results for orders placed or performed during the hospital encounter of 05/03/21 (from the past 24 hour(s))   CBC     Status: Abnormal   Collection Time: 05/05/21  5:13 AM  Result Value Ref Range   WBC 8.1 4.0 - 10.5 K/uL   RBC 3.53 (L) 3.87 - 5.11 MIL/uL   Hemoglobin 9.4 (L) 12.0 - 15.0 g/dL   HCT 28.1 (L) 36.0 - 46.0 %   MCV 79.6 (L) 80.0 - 100.0 fL   MCH 26.6 26.0 - 34.0 pg   MCHC 33.5 30.0 - 36.0 g/dL   RDW 18.1 (H) 11.5 - 15.5 %   Platelets 81 (L) 150 - 400 K/uL   nRBC 0.0 0.0 - 0.2 %  Basic metabolic panel     Status: Abnormal   Collection Time: 05/05/21  5:13 AM  Result Value Ref Range   Sodium 140 135 - 145 mmol/L   Potassium 3.7 3.5 - 5.1 mmol/L   Chloride 109 98 - 111 mmol/L   CO2 23 22 - 32 mmol/L   Glucose, Bld 93 70 - 99 mg/dL   BUN 20 8 - 23 mg/dL   Creatinine, Ser 1.50 (H) 0.44 - 1.00 mg/dL   Calcium 9.7 8.9 - 10.3 mg/dL   GFR, Estimated 35 (L) >60 mL/min   Anion gap 8 5 - 15    Recent Results (from the past 240 hour(s))  Resp Panel by RT-PCR (Flu A&B, Covid) Nasopharyngeal Swab     Status: None   Collection Time: 05/03/21  7:57 PM   Specimen: Nasopharyngeal Swab; Nasopharyngeal(NP) swabs in vial transport medium  Result Value Ref Range Status   SARS Coronavirus 2 by RT PCR NEGATIVE NEGATIVE Final    Comment: (NOTE) SARS-CoV-2 target nucleic acids are NOT DETECTED.  The SARS-CoV-2 RNA is generally detectable in upper respiratory specimens during the acute phase of infection. The lowest concentration of SARS-CoV-2 viral copies this assay can detect is 138 copies/mL. A negative result does not preclude SARS-Cov-2 infection and should not be used as the sole basis for treatment or other patient management decisions. A negative result may occur with  improper specimen collection/handling, submission of specimen other than nasopharyngeal swab, presence of viral mutation(s) within the areas targeted by this assay, and inadequate number of viral copies(<138 copies/mL). A negative result must be combined with clinical observations, patient history, and  epidemiological information. The expected result is Negative.  Fact Sheet for Patients:  EntrepreneurPulse.com.au  Fact Sheet for Healthcare Providers:  IncredibleEmployment.be  This test is no t yet approved or cleared by the [place] FDA and  has been authorized for detection and/or diagnosis of SARS-CoV-2 by FDA under an Emergency Use Authorization (EUA). This EUA will remain  in effect (meaning this test can be used) for the duration of the COVID-19 declaration under Section 564(b)(1) of the Act, 21 U.S.C.section 360bbb-3(b)(1), unless the authorization is terminated  or revoked sooner.       Influenza A by PCR NEGATIVE NEGATIVE Final   Influenza B by PCR NEGATIVE NEGATIVE Final    Comment: (NOTE) The Xpert Xpress SARS-CoV-2/FLU/RSV plus assay is intended as an aid in the diagnosis of influenza from Nasopharyngeal swab specimens and should not be used as a sole basis for treatment. Nasal washings and aspirates are unacceptable for Xpert Xpress SARS-CoV-2/FLU/RSV testing.  Fact Sheet for Patients: EntrepreneurPulse.com.au  Fact Sheet for Healthcare Providers: IncredibleEmployment.be  This test is not yet approved or cleared by the [place] FDA and has been authorized for detection and/or diagnosis of SARS-CoV-2 by FDA under an Emergency Use Authorization (EUA). This EUA will remain in effect (meaning this test can be used) for the duration of the COVID-19 declaration under Section 564(b)(1) of the Act, 21 U.S.C. section 360bbb-3(b)(1), unless the authorization is terminated or revoked.  Performed at [facility], [place] [address] [place], [place] [address]   Urine Culture     Status: Abnormal (Preliminary result)   Collection Time: 05/04/21  5:31 AM   Specimen: Urine, Clean Catch  Result Value Ref Range Status   Specimen Description   Final    URINE, CLEAN  CATCH Performed at [facility], [place] [address] [place], [place] [address]    Special Requests   Final    NONE Performed at [facility], [place] [address] [place], [place] [address]    Culture >=100,000 COLONIES/mL ESCHERICHIA COLI (A)  Final   Report Status PENDING  Incomplete     Radiology Studies: CT HEAD WO CONTRAST (5MM)  Result Date: 05/03/2021 CLINICAL DATA:  Fall, syncope, dizziness, weakness EXAM: CT HEAD WITHOUT CONTRAST TECHNIQUE: Contiguous axial images were obtained from the base of the skull through the vertex without intravenous contrast. COMPARISON:  03/08/2016, 06/04/2020 FINDINGS: Brain: No evidence of acute infarction, hemorrhage, hydrocephalus, extra-axial collection or mass lesion/mass effect. Extensive low-density changes within the periventricular and subcortical white matter compatible with chronic microvascular ischemic change. Mild diffuse cerebral volume loss. Vascular: Atherosclerotic calcifications involving the large vessels of the skull base. No unexpected hyperdense vessel. Skull: Normal. Negative for fracture or focal lesion. Sinuses/Orbits: No acute finding. Other: Negative for scalp hematoma. IMPRESSION: 1. No acute intracranial findings. 2. Chronic microvascular ischemic change and cerebral volume loss. Electronically Signed   By: Davina Poke D.O.   On: 05/03/2021 18:52   CT HEAD WO CONTRAST (5MM)  Final Result      Scheduled Meds:  sodium chloride   Intravenous Once   donepezil  10 mg Oral Daily   [START ON 05/06/2021] fluconazole  200 mg Oral Daily   fluconazole  400 mg Oral Once   levothyroxine  75 mcg Oral Q0600   memantine  10 mg Oral BID   mirtazapine  15 mg Oral QHS   pantoprazole (PROTONIX) IV  40 mg Intravenous Q12H   polyethylene glycol  17 g Oral Daily   sodium chloride flush  3 mL Intravenous Q12H   PRN Meds: sodium chloride, acetaminophen, ondansetron **OR** ondansetron (ZOFRAN) IV Continuous  Infusions:  sodium chloride 1,000 mL (05/04/21 1700)   ciprofloxacin 400 mg (05/05/21 0949)     LOS: 2 days  Time spent: Greater than 50% of the 35 minute visit was spent in counseling/coordination of care for the patient as laid out in the A&P.   [name], MD [facility] 05/05/2021, 4:26 PM

## 2021-05-06 DIAGNOSIS — K922 Gastrointestinal hemorrhage, unspecified: Secondary | ICD-10-CM | POA: Diagnosis not present

## 2021-05-06 LAB — CBC WITH DIFFERENTIAL/PLATELET
Abs Immature Granulocytes: 0.07 10*3/uL (ref 0.00–0.07)
Basophils Absolute: 0 10*3/uL (ref 0.0–0.1)
Basophils Relative: 0 %
Eosinophils Absolute: 0 10*3/uL (ref 0.0–0.5)
Eosinophils Relative: 1 %
HCT: 28.1 % — ABNORMAL LOW (ref 36.0–46.0)
Hemoglobin: 9 g/dL — ABNORMAL LOW (ref 12.0–15.0)
Immature Granulocytes: 1 %
Lymphocytes Relative: 25 %
Lymphs Abs: 1.5 10*3/uL (ref 0.7–4.0)
MCH: 25.8 pg — ABNORMAL LOW (ref 26.0–34.0)
MCHC: 32 g/dL (ref 30.0–36.0)
MCV: 80.5 fL (ref 80.0–100.0)
Monocytes Absolute: 0.4 10*3/uL (ref 0.1–1.0)
Monocytes Relative: 7 %
Neutro Abs: 4.1 10*3/uL (ref 1.7–7.7)
Neutrophils Relative %: 66 %
Platelets: 87 10*3/uL — ABNORMAL LOW (ref 150–400)
RBC: 3.49 MIL/uL — ABNORMAL LOW (ref 3.87–5.11)
RDW: 18.1 % — ABNORMAL HIGH (ref 11.5–15.5)
WBC: 6.1 10*3/uL (ref 4.0–10.5)
nRBC: 0 % (ref 0.0–0.2)

## 2021-05-06 LAB — BASIC METABOLIC PANEL
Anion gap: 10 (ref 5–15)
BUN: 27 mg/dL — ABNORMAL HIGH (ref 8–23)
CO2: 22 mmol/L (ref 22–32)
Calcium: 9.6 mg/dL (ref 8.9–10.3)
Chloride: 108 mmol/L (ref 98–111)
Creatinine, Ser: 1.66 mg/dL — ABNORMAL HIGH (ref 0.44–1.00)
GFR, Estimated: 31 mL/min — ABNORMAL LOW (ref >60)
Glucose, Bld: 90 mg/dL (ref 70–99)
Potassium: 3.8 mmol/L (ref 3.5–5.1)
Sodium: 140 mmol/L (ref 135–145)

## 2021-05-06 LAB — URINE CULTURE: Culture: >=100000 — AB

## 2021-05-06 LAB — MAGNESIUM: Magnesium: 2 mg/dL (ref 1.7–2.4)

## 2021-05-06 MED ORDER — PANTOPRAZOLE SODIUM 40 MG PO TBEC: 40.0000 mg | DELAYED_RELEASE_TABLET | Freq: Every day | ORAL | 3 refills | Status: DC

## 2021-05-06 MED ORDER — FLUCONAZOLE 100 MG PO TABS: 100.0000 mg | ORAL_TABLET | Freq: Every day | ORAL | 0 refills | Status: AC

## 2021-05-06 NOTE — Discharge Instructions (Signed)
Hold aspirin for 1 week then resume.   Soft for 3 days then okay to resume normal diet

## 2021-05-06 NOTE — TOC Transition Note (Signed)
Transition of Care [facility]) - CM/SW Discharge Note   Patient Details  Name: Susan [name] MRN: [id number] Date of Birth: [birth year]  [facility] CM/SW Contact:  Malikye Reppond, Marjie Skiff, RN Phone Number: 05/06/2021, 11:15 AM   Clinical Narrative:    Came to pt room to discuss DC planning and pt has already discharged. Per RN she was picked up by her friend and was A&O.          Readmission Risk Interventions No flowsheet data found.

## 2021-05-06 NOTE — Discharge Summary (Addendum)
Physician Discharge Summary   Susan [name] Well YFV:[id number] DOB: [birth year] DOA: 05/03/2021  PCP: Sonia Side., [name]  Admit date: 05/03/2021 Discharge date: 05/06/2021   Admitted From: home Disposition:  home Discharging physician: [name], MD  Recommendations for Outpatient Follow-up:  Follow up with GI as needed    Patient discharged to home in Discharge Condition: stable Risk of unplanned readmission score: Unplanned Admission- Pilot do not use: 20.55  CODE STATUS: Full Diet recommendation:  Diet Orders (From admission, onward)     Start     Ordered   05/05/21 1159  DIET SOFT Room service appropriate? No; Fluid consistency: Thin  Diet effective now       Question Answer Comment  Room service appropriate? No   Fluid consistency: Thin      05/05/21 1158            Hospital Course:  Susan [name] is a 80 y.o. female with medical history significant for myelodysplasia, hypertension, hypothyroidism, and memory loss, now presenting to the emergency department reporting black stool with clots and a syncopal episode.  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  She reports no bleeding until 04/30/2021 when she noted her stool was black.  She has continued to have black stool, also some clots, and had a ?syncopal episode when she was getting dressed for church.  Denies any aspirin or NSAID use recently.   She underwent EGD on 05/04/21: "single localized spot with bleeding was found in the cardia. Focal bleeding gastritis or possible AVM. 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  Treated with APC -Continue IV PPI twice daily for now, transition to p.o. daily at discharge - Hgb stable, 9 g/dL at discharge   Candida esophagitis - Pathology results returned from EGD - Started on fluconazole for total of 14-day course per GI - dose was renally adjusted   Possible syncope - no further symptoms   AKI on CKD stage IIIb -Baseline creatinine 1.4 - improved with IVF  MDS, thrombocytopenia -Continue to monitor CBC  Hypothyroidism -Continue Synthroid  Mild memory loss -Delirium precaution -Continue aricept, namenda, remeron   Dysuria -Empiric Cipro, 3 day course completed inpatient    The patient's chronic medical conditions were treated accordingly per the patient's home medication regimen except as noted.  On day of discharge, patient was felt deemed stable for discharge. Patient/family member advised to call PCP or come back to ER if needed.   Principal Diagnosis: Acute GI bleeding  Discharge Diagnoses: Active Hospital Problems   Diagnosis Date Noted   Acute GI bleeding 05/03/2021   Gastritis without bleeding    Melena    Syncope 05/03/2021   Acute renal failure superimposed on stage 3b chronic kidney disease ([place]) 05/03/2021   Thrombocytopenia ([place]) 05/03/2021   Symptomatic anemia 01/08/2021   Dementia without behavioral disturbance ([place]) 02/27/2020  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[place]) 11/03/2014   Hypothyroidism    Essential hypertension     Resolved Hospital Problems  No resolved problems to display.    Discharge Instructions     Increase activity slowly   Complete by: As directed       Allergies as of 05/06/2021       Reactions    Codeine Anaphylaxis   Penicillins Anaphylaxis   Bactrim Itching, Swelling   Clarithromycin Other (See Comments)   "Caused skin to peel off my hand."   Flagyl [metronidazole Hcl] Itching, Swelling   Food Other (See Comments)   EATS WHITE RICE ONLY (ALLERGIC REACTION TO YELLOW OR BROWN RICE THAT RESULTED IN HOSPITALIZATION)   Pineapple Itching, Swelling   SWELLING OF EYES   Sulfa Antibiotics Itching, Swelling        Medication List     TAKE these medications    acetaminophen 500 MG tablet Commonly known as: TYLENOL Take 500 mg by mouth every 6 (six) hours as needed for moderate pain (for pain.).   allopurinol 300 MG tablet Commonly known as: ZYLOPRIM Take 300 mg by mouth daily.   amLODipine 10 MG tablet Commonly known as: NORVASC TAKE 1/2 TABLET BY MOUTH DAILY AT BEDTIME; (TAKE 1 TABLET IF SBP IS >150) What changed:  how much to take how to take this when to take this additional instructions   aspirin 81 MG chewable tablet Chew 81 mg by mouth daily.   donepezil 10 MG tablet Commonly known as: ARICEPT Take 10 mg by mouth daily.   famotidine 20 MG tablet Commonly known as: PEPCID Take 20 mg by mouth 2 (two) times daily.   fluconazole 100 MG tablet Commonly known as: DIFLUCAN Take 1 tablet (100 mg total) by mouth daily for 12 days. Start taking on: May 07, 2021   labetalol 200 MG tablet Commonly known as: NORMODYNE Take 200 mg by mouth 2 (two) times daily.   levothyroxine 75 MCG tablet Commonly known as: SYNTHROID Take 75 mcg by mouth every morning.   memantine 10 MG tablet Commonly known as: Namenda Take 1 tablet (10 mg total) by mouth 2 (two) times daily.   mirtazapine 15 MG tablet Commonly known as: REMERON Take 15 mg by mouth at bedtime.   montelukast 10 MG tablet Commonly known as: SINGULAIR Take 10 mg by mouth at bedtime.   pantoprazole 40 MG tablet Commonly known as: PROTONIX Take 1 tablet (40 mg total) by mouth daily. What changed:  when to take this   traMADol 50 MG tablet Commonly known as: ULTRAM Take 50 mg by mouth every 6 (six) hours as needed for moderate pain.        Follow-up Information     Mauri Pole, MD. Schedule an appointment as soon as possible for a visit in 2 week(s).   Specialty: Gastroenterology Contact information: [place] [id number] [phone]                Allergies  Allergen Reactions   Codeine Anaphylaxis   Penicillins Anaphylaxis   Bactrim Itching and Swelling   Clarithromycin Other (See Comments)    "Caused skin to peel off my hand."   Flagyl [Metronidazole Hcl] Itching and Swelling   Food Other (See Comments)    EATS WHITE RICE ONLY (ALLERGIC REACTION TO YELLOW OR BROWN RICE THAT RESULTED IN HOSPITALIZATION)   Pineapple Itching and Swelling    SWELLING OF EYES   Sulfa Antibiotics Itching and Swelling  Consultations: GI  Discharge Exam: BP 120/76 (BP Location: Left Arm)   Pulse 68   Temp 98.3 F (36.8 C) (Oral)   Resp 16   Ht 5\' 2"  (1.575 m)   Wt 62.6 kg   SpO2 100%   BMI 25.24 kg/m  General appearance: alert, cooperative, and no distress Head: Normocephalic, without obvious abnormality, atraumatic Eyes:  EOMI Lungs: clear to auscultation bilaterally Heart: regular rate and rhythm and S1, S2 normal Abdomen: normal findings: bowel sounds normal and soft, non-tender Extremities:  no edema Skin: mobility and turgor normal Neurologic: Grossly normal  The results of significant diagnostics from this hospitalization (including imaging, microbiology, ancillary and laboratory) are listed below for reference.   Microbiology: Recent Results (from the past 240 hour(s))  Resp Panel by RT-PCR (Flu A&B, Covid) Nasopharyngeal Swab     Status: None   Collection Time: 05/03/21  7:57 PM   Specimen: Nasopharyngeal Swab; Nasopharyngeal(NP) swabs in vial transport medium  Result Value Ref Range Status   SARS Coronavirus 2 by RT PCR  NEGATIVE NEGATIVE Final    Comment: (NOTE) SARS-CoV-2 target nucleic acids are NOT DETECTED.  The SARS-CoV-2 RNA is generally detectable in upper respiratory specimens during the acute phase of infection. The lowest concentration of SARS-CoV-2 viral copies this assay can detect is 138 copies/mL. A negative result does not preclude SARS-Cov-2 infection and should not be used as the sole basis for treatment or other patient management decisions. A negative result may occur with  improper specimen collection/handling, submission of specimen other than nasopharyngeal swab, presence of viral mutation(s) within the areas targeted by this assay, and inadequate number of viral copies(<138 copies/mL). A negative result must be combined with clinical observations, patient history, and epidemiological information. The expected result is Negative.  Fact Sheet for Patients:  EntrepreneurPulse.com.au  Fact Sheet for Healthcare Providers:  IncredibleEmployment.be  This test is no t yet approved or cleared by the [place] FDA and  has been authorized for detection and/or diagnosis of SARS-CoV-2 by FDA under an Emergency Use Authorization (EUA). This EUA will remain  in effect (meaning this test can be used) for the duration of the COVID-19 declaration under Section 564(b)(1) of the Act, 21 U.S.C.section 360bbb-3(b)(1), unless the authorization is terminated  or revoked sooner.       Influenza A by PCR NEGATIVE NEGATIVE Final   Influenza B by PCR NEGATIVE NEGATIVE Final    Comment: (NOTE) The Xpert Xpress SARS-CoV-2/FLU/RSV plus assay is intended as an aid in the diagnosis of influenza from Nasopharyngeal swab specimens and should not be used as a sole basis for treatment. Nasal washings and aspirates are unacceptable for Xpert Xpress SARS-CoV-2/FLU/RSV testing.  Fact Sheet for Patients: EntrepreneurPulse.com.au  Fact Sheet for  Healthcare Providers: IncredibleEmployment.be  This test is not yet approved or cleared by the [place] FDA and has been authorized for detection and/or diagnosis of SARS-CoV-2 by FDA under an Emergency Use Authorization (EUA). This EUA will remain in effect (meaning this test can be used) for the duration of the COVID-19 declaration under Section 564(b)(1) of the Act, 21 U.S.C. section 360bbb-3(b)(1), unless the authorization is terminated or revoked.  Performed at [facility], [place] [address], [place], [place] [address]   Urine Culture     Status: Abnormal   Collection Time: 05/04/21  5:31 AM   Specimen: Urine, Clean Catch  Result Value Ref Range Status   Specimen Description   Final    URINE, CLEAN CATCH Performed  at [facility], [place] [address] [place], [place] [address]    Special Requests   Final    NONE Performed at [facility], [place] [address] [place], [place] [address]    Culture >=100,000 COLONIES/mL ESCHERICHIA COLI (A)  Final   Report Status 05/06/2021 FINAL  Final   Organism ID, Bacteria ESCHERICHIA COLI (A)  Final      Susceptibility   Escherichia coli - MIC*    AMPICILLIN <=2 SENSITIVE Sensitive     CEFAZOLIN <=4 SENSITIVE Sensitive     CEFEPIME <=0.12 SENSITIVE Sensitive     CEFTRIAXONE <=0.25 SENSITIVE Sensitive     CIPROFLOXACIN <=0.25 SENSITIVE Sensitive     GENTAMICIN <=1 SENSITIVE Sensitive     IMIPENEM <=0.25 SENSITIVE Sensitive     NITROFURANTOIN <=16 SENSITIVE Sensitive     TRIMETH/SULFA <=20 SENSITIVE Sensitive     AMPICILLIN/SULBACTAM <=2 SENSITIVE Sensitive     PIP/TAZO <=4 SENSITIVE Sensitive     * >=100,000 COLONIES/mL ESCHERICHIA COLI     Labs: BNP (last 3 results) No results for input(s): BNP in the last 8760 hours. Basic Metabolic Panel: Recent Labs  Lab 05/03/21 1751 05/04/21 0414 05/05/21 0513 05/06/21 0540  NA 139 140 140 140  K 3.9 3.8 3.7 3.8   CL 111 111 109 108  CO2 22 23 23 22   GLUCOSE 99 84 93 90  BUN 43* 34* 20 27*  CREATININE 2.46* 1.99* 1.50* 1.66*  CALCIUM 9.4 9.5 9.7 9.6  MG  --   --   --  2.0   Liver Function Tests: Recent Labs  Lab 05/03/21 1751  AST 18  ALT 9  ALKPHOS 100  BILITOT 0.4  PROT 7.1  ALBUMIN 3.8   No results for input(s): LIPASE, AMYLASE in the last 168 hours. No results for input(s): AMMONIA in the last 168 hours. CBC: Recent Labs  Lab 05/03/21 1751 05/04/21 0414 05/04/21 0805 05/05/21 0513 05/06/21 0540  WBC 6.0 5.6 6.6 8.1 6.1  NEUTROABS 4.0  --   --   --  4.1  HGB 5.8* 9.2* 9.3* 9.4* 9.0*  HCT 18.0* 28.0* 27.7* 28.1* 28.1*  MCV 82.6 79.8* 79.4* 79.6* 80.5  PLT 92* 87* 88* 81* 87*   Cardiac Enzymes: No results for input(s): CKTOTAL, CKMB, CKMBINDEX, TROPONINI in the last 168 hours. BNP: Invalid input(s): POCBNP CBG: No results for input(s): GLUCAP in the last 168 hours. D-Dimer No results for input(s): DDIMER in the last 72 hours. Hgb A1c No results for input(s): HGBA1C in the last 72 hours. Lipid Profile No results for input(s): CHOL, HDL, LDLCALC, TRIG, CHOLHDL, LDLDIRECT in the last 72 hours. Thyroid function studies No results for input(s): TSH, T4TOTAL, T3FREE, THYROIDAB in the last 72 hours.  Invalid input(s): FREET3 Anemia work up No results for input(s): VITAMINB12, FOLATE, FERRITIN, TIBC, IRON, RETICCTPCT in the last 72 hours. Urinalysis    Component Value Date/Time   COLORURINE YELLOW 05/04/2021 0531   APPEARANCEUR CLOUDY (A) 05/04/2021 0531   LABSPEC 1.012 05/04/2021 0531   PHURINE 5.0 05/04/2021 0531   GLUCOSEU NEGATIVE 05/04/2021 0531   HGBUR SMALL (A) 05/04/2021 0531   BILIRUBINUR NEGATIVE 05/04/2021 0531   KETONESUR NEGATIVE 05/04/2021 0531   PROTEINUR 30 (A) 05/04/2021 0531   UROBILINOGEN 1.0 07/07/2015 1917   NITRITE NEGATIVE 05/04/2021 0531   LEUKOCYTESUR LARGE (A) 05/04/2021 0531   Sepsis Labs Invalid input(s): PROCALCITONIN,  WBC,   LACTICIDVEN Microbiology Recent Results (from the past 240 hour(s))  Resp Panel  by RT-PCR (Flu A&B, Covid) Nasopharyngeal Swab     Status: None   Collection Time: 05/03/21  7:57 PM   Specimen: Nasopharyngeal Swab; Nasopharyngeal(NP) swabs in vial transport medium  Result Value Ref Range Status   SARS Coronavirus 2 by RT PCR NEGATIVE NEGATIVE Final    Comment: (NOTE) SARS-CoV-2 target nucleic acids are NOT DETECTED.  The SARS-CoV-2 RNA is generally detectable in upper respiratory specimens during the acute phase of infection. The lowest concentration of SARS-CoV-2 viral copies this assay can detect is 138 copies/mL. A negative result does not preclude SARS-Cov-2 infection and should not be used as the sole basis for treatment or other patient management decisions. A negative result may occur with  improper specimen collection/handling, submission of specimen other than nasopharyngeal swab, presence of viral mutation(s) within the areas targeted by this assay, and inadequate number of viral copies(<138 copies/mL). A negative result must be combined with clinical observations, patient history, and epidemiological information. The expected result is Negative.  Fact Sheet for Patients:  EntrepreneurPulse.com.au  Fact Sheet for Healthcare Providers:  IncredibleEmployment.be  This test is no t yet approved or cleared by the [place] FDA and  has been authorized for detection and/or diagnosis of SARS-CoV-2 by FDA under an Emergency Use Authorization (EUA). This EUA will remain  in effect (meaning this test can be used) for the duration of the COVID-19 declaration under Section 564(b)(1) of the Act, 21 U.S.C.section 360bbb-3(b)(1), unless the authorization is terminated  or revoked sooner.       Influenza A by PCR NEGATIVE NEGATIVE Final   Influenza B by PCR NEGATIVE NEGATIVE Final    Comment: (NOTE) The Xpert Xpress SARS-CoV-2/FLU/RSV plus  assay is intended as an aid in the diagnosis of influenza from Nasopharyngeal swab specimens and should not be used as a sole basis for treatment. Nasal washings and aspirates are unacceptable for Xpert Xpress SARS-CoV-2/FLU/RSV testing.  Fact Sheet for Patients: EntrepreneurPulse.com.au  Fact Sheet for Healthcare Providers: IncredibleEmployment.be  This test is not yet approved or cleared by the [place] FDA and has been authorized for detection and/or diagnosis of SARS-CoV-2 by FDA under an Emergency Use Authorization (EUA). This EUA will remain in effect (meaning this test can be used) for the duration of the COVID-19 declaration under Section 564(b)(1) of the Act, 21 U.S.C. section 360bbb-3(b)(1), unless the authorization is terminated or revoked.  Performed at [facility], [place] [address], [place], [place] [address]   Urine Culture     Status: Abnormal   Collection Time: 05/04/21  5:31 AM   Specimen: Urine, Clean Catch  Result Value Ref Range Status   Specimen Description   Final    URINE, CLEAN CATCH Performed at [facility], [place] [address], [place], [place] [address]    Special Requests   Final    NONE Performed at [facility], [place] [address] [place], [place] [address]    Culture >=100,000 COLONIES/mL ESCHERICHIA COLI (A)  Final   Report Status 05/06/2021 FINAL  Final   Organism ID, Bacteria ESCHERICHIA COLI (A)  Final      Susceptibility   Escherichia coli - MIC*    AMPICILLIN <=2 SENSITIVE Sensitive     CEFAZOLIN <=4 SENSITIVE Sensitive     CEFEPIME <=0.12 SENSITIVE Sensitive     CEFTRIAXONE <=0.25 SENSITIVE Sensitive     CIPROFLOXACIN <=0.25 SENSITIVE Sensitive     GENTAMICIN <=1 SENSITIVE Sensitive     IMIPENEM <=0.25 SENSITIVE Sensitive  NITROFURANTOIN <=16 SENSITIVE Sensitive     TRIMETH/SULFA <=20 SENSITIVE Sensitive     AMPICILLIN/SULBACTAM <=2  SENSITIVE Sensitive     PIP/TAZO <=4 SENSITIVE Sensitive     * >=100,000 COLONIES/mL ESCHERICHIA COLI    Procedures/Studies: CT HEAD WO CONTRAST (5MM)  Result Date: 05/03/2021 CLINICAL DATA:  Fall, syncope, dizziness, weakness EXAM: CT HEAD WITHOUT CONTRAST TECHNIQUE: Contiguous axial images were obtained from the base of the skull through the vertex without intravenous contrast. COMPARISON:  03/08/2016, 06/04/2020 FINDINGS: Brain: No evidence of acute infarction, hemorrhage, hydrocephalus, extra-axial collection or mass lesion/mass effect. Extensive low-density changes within the periventricular and subcortical white matter compatible with chronic microvascular ischemic change. Mild diffuse cerebral volume loss. Vascular: Atherosclerotic calcifications involving the large vessels of the skull base. No unexpected hyperdense vessel. Skull: Normal. Negative for fracture or focal lesion. Sinuses/Orbits: No acute finding. Other: Negative for scalp hematoma. IMPRESSION: 1. No acute intracranial findings. 2. Chronic microvascular ischemic change and cerebral volume loss. Electronically Signed   By: Davina Poke D.O.   On: 05/03/2021 18:52     Time coordinating discharge: Over 30 minutes    [name], MD  [facility] 05/06/2021, 2:48 PM

## 2021-05-14 ENCOUNTER — Inpatient Hospital Stay: Payer: Medicare Other

## 2021-05-14 ENCOUNTER — Inpatient Hospital Stay: Payer: Medicare Other | Attending: Oncology

## 2021-05-14 ENCOUNTER — Other Ambulatory Visit: Payer: Self-pay

## 2021-05-14 VITALS — BP 94/61 | HR 52 | Resp 14

## 2021-05-14 DIAGNOSIS — D631 Anemia in chronic kidney disease: Secondary | ICD-10-CM | POA: Diagnosis present

## 2021-05-14 DIAGNOSIS — N1831 Chronic kidney disease, stage 3a: Secondary | ICD-10-CM | POA: Insufficient documentation

## 2021-05-14 DIAGNOSIS — N189 Chronic kidney disease, unspecified: Secondary | ICD-10-CM

## 2021-05-14 LAB — CBC WITH DIFFERENTIAL (CANCER CENTER ONLY)
Abs Immature Granulocytes: 0.01 10*3/uL (ref 0.00–0.07)
Basophils Absolute: 0 10*3/uL (ref 0.0–0.1)
Basophils Relative: 0 %
Eosinophils Absolute: 0.1 10*3/uL (ref 0.0–0.5)
Eosinophils Relative: 1 %
HCT: 25.8 % — ABNORMAL LOW (ref 36.0–46.0)
Hemoglobin: 8.5 g/dL — ABNORMAL LOW (ref 12.0–15.0)
Immature Granulocytes: 0 %
Lymphocytes Relative: 35 %
Lymphs Abs: 1.5 10*3/uL (ref 0.7–4.0)
MCH: 26.3 pg (ref 26.0–34.0)
MCHC: 32.9 g/dL (ref 30.0–36.0)
MCV: 79.9 fL — ABNORMAL LOW (ref 80.0–100.0)
Monocytes Absolute: 0.3 10*3/uL (ref 0.1–1.0)
Monocytes Relative: 6 %
Neutro Abs: 2.4 10*3/uL (ref 1.7–7.7)
Neutrophils Relative %: 58 %
Platelet Count: 121 10*3/uL — ABNORMAL LOW (ref 150–400)
RBC: 3.23 MIL/uL — ABNORMAL LOW (ref 3.87–5.11)
RDW: 17.7 % — ABNORMAL HIGH (ref 11.5–15.5)
WBC Count: 4.2 10*3/uL (ref 4.0–10.5)
nRBC: 0 % (ref 0.0–0.2)

## 2021-05-14 LAB — CMP (CANCER CENTER ONLY)
ALT: 12 U/L (ref 0–44)
AST: 17 U/L (ref 15–41)
Albumin: 4 g/dL (ref 3.5–5.0)
Alkaline Phosphatase: 126 U/L (ref 38–126)
Anion gap: 9 (ref 5–15)
BUN: 30 mg/dL — ABNORMAL HIGH (ref 8–23)
CO2: 21 mmol/L — ABNORMAL LOW (ref 22–32)
Calcium: 9.8 mg/dL (ref 8.9–10.3)
Chloride: 112 mmol/L — ABNORMAL HIGH (ref 98–111)
Creatinine: 2.34 mg/dL — ABNORMAL HIGH (ref 0.44–1.00)
GFR, Estimated: 20 mL/min — ABNORMAL LOW (ref >60)
Glucose, Bld: 104 mg/dL — ABNORMAL HIGH (ref 70–99)
Potassium: 4.1 mmol/L (ref 3.5–5.1)
Sodium: 142 mmol/L (ref 135–145)
Total Bilirubin: 0.4 mg/dL (ref 0.3–1.2)
Total Protein: 7.8 g/dL (ref 6.5–8.1)

## 2021-05-14 LAB — SAMPLE TO BLOOD BANK

## 2021-05-14 MED ORDER — DARBEPOETIN ALFA 500 MCG/ML IJ SOSY
500.0000 ug | PREFILLED_SYRINGE | Freq: Once | INTRAMUSCULAR | Status: AC
  Administered 2021-05-14: 500 ug via SUBCUTANEOUS
  Filled 2021-05-14: qty 1

## 2021-05-14 NOTE — Patient Instructions (Signed)
Darbepoetin Alfa injection What is this medication? DARBEPOETIN ALFA (dar be POE e tin AL fa) helps your body make more red blood cells. It is used to treat anemia caused by chronic kidney failure and chemotherapy. This medicine may be used for other purposes; ask your health care provider or pharmacist if you have questions. COMMON BRAND NAME(S): Aranesp What should I tell my care team before I take this medication? They need to know if you have any of these conditions: blood clotting disorders or history of blood clots cancer patient not on chemotherapy cystic fibrosis heart disease, such as angina, heart failure, or a history of a heart attack hemoglobin level of 12 g/dL or greater high blood pressure low levels of folate, iron, or vitamin B12 seizures an unusual or allergic reaction to darbepoetin, erythropoietin, albumin, hamster proteins, latex, other medicines, foods, dyes, or preservatives pregnant or trying to get pregnant breast-feeding How should I use this medication? This medicine is for injection into a vein or under the skin. It is usually given by a health care professional in a hospital or clinic setting. If you get this medicine at home, you will be taught how to prepare and give this medicine. Use exactly as directed. Take your medicine at regular intervals. Do not take your medicine more often than directed. It is important that you put your used needles and syringes in a special sharps container. Do not put them in a trash can. If you do not have a sharps container, call your pharmacist or healthcare provider to get one. A special MedGuide will be given to you by the pharmacist with each prescription and refill. Be sure to read this information carefully each time. Talk to your pediatrician regarding the use of this medicine in children. While this medicine may be used in children as young as 1 month of age for selected conditions, precautions do apply. Overdosage: If you  think you have taken too much of this medicine contact a poison control center or emergency room at once. NOTE: This medicine is only for you. Do not share this medicine with others. What if I miss a dose? If you miss a dose, take it as soon as you can. If it is almost time for your next dose, take only that dose. Do not take double or extra doses. What may interact with this medication? Do not take this medicine with any of the following medications: epoetin alfa This list may not describe all possible interactions. Give your health care provider a list of all the medicines, herbs, non-prescription drugs, or dietary supplements you use. Also tell them if you smoke, drink alcohol, or use illegal drugs. Some items may interact with your medicine. What should I watch for while using this medication? Your condition will be monitored carefully while you are receiving this medicine. You may need blood work done while you are taking this medicine. This medicine may cause a decrease in vitamin B6. You should make sure that you get enough vitamin B6 while you are taking this medicine. Discuss the foods you eat and the vitamins you take with your health care professional. What side effects may I notice from receiving this medication? Side effects that you should report to your doctor or health care professional as soon as possible: allergic reactions like skin rash, itching or hives, swelling of the face, lips, or tongue breathing problems changes in vision chest pain confusion, trouble speaking or understanding feeling faint or lightheaded, falls high blood pressure   muscle aches or pains pain, swelling, warmth in the leg rapid weight gain severe headaches sudden numbness or weakness of the face, arm or leg trouble walking, dizziness, loss of balance or coordination seizures (convulsions) swelling of the ankles, feet, hands unusually weak or tired Side effects that usually do not require medical  attention (report to your doctor or health care professional if they continue or are bothersome): diarrhea fever, chills (flu-like symptoms) headaches nausea, vomiting redness, stinging, or swelling at site where injected This list may not describe all possible side effects. Call your doctor for medical advice about side effects. You may report side effects to FDA at 1-800-FDA-1088. Where should I keep my medication? Keep out of the reach of children. Store in a refrigerator between 2 and 8 degrees C (36 and 46 degrees F). Do not freeze. Do not shake. Throw away any unused portion if using a single-dose vial. Throw away any unused medicine after the expiration date. NOTE: This sheet is a summary. It may not cover all possible information. If you have questions about this medicine, talk to your doctor, pharmacist, or health care provider.  2022 Elsevier/Gold Standard (2017-10-04 16:44:20)  

## 2021-06-02 ENCOUNTER — Ambulatory Visit (INDEPENDENT_AMBULATORY_CARE_PROVIDER_SITE_OTHER): Payer: Medicare Other | Admitting: Podiatry

## 2021-06-02 ENCOUNTER — Other Ambulatory Visit: Payer: Self-pay

## 2021-06-02 ENCOUNTER — Encounter: Payer: Self-pay | Admitting: Podiatry

## 2021-06-02 DIAGNOSIS — M79674 Pain in right toe(s): Secondary | ICD-10-CM | POA: Diagnosis not present

## 2021-06-02 DIAGNOSIS — M79675 Pain in left toe(s): Secondary | ICD-10-CM | POA: Diagnosis not present

## 2021-06-02 DIAGNOSIS — N1831 Chronic kidney disease, stage 3a: Secondary | ICD-10-CM | POA: Diagnosis not present

## 2021-06-02 DIAGNOSIS — B351 Tinea unguium: Secondary | ICD-10-CM

## 2021-06-02 NOTE — Progress Notes (Signed)
This patient returns to my office for at risk foot care.  This patient requires this care by a professional since this patient will be at risk due to having chronic kidney disease.   This patient is unable to cut nails herself since the patient cannot reach her nails.These nails are painful walking and wearing shoes.  This patient presents for at risk foot care today.  General Appearance  Alert, conversant and in no acute stress.  Vascular  Dorsalis pedis and posterior tibial  pulses are palpable  bilaterally.  Capillary return is within normal limits  bilaterally. Temperature is within normal limits  bilaterally.  Neurologic  Senn-Weinstein monofilament wire test within normal limits  bilaterally. Muscle power within normal limits bilaterally.  Nails Thick disfigured discolored nails with subungual debris  from hallux to fifth toes bilaterally. No evidence of bacterial infection or drainage bilaterally.  Orthopedic  No limitations of motion  feet .  No crepitus or effusions noted.  HAV with contracted digits  B/L.  Skin  normotropic skin noted bilaterally.  No signs of infections or ulcers noted.   Porokeratosis sub 3 left forefoot asymptomatic.  Onychomycosis  Pain in right toes  Pain in left toes  Consent was obtained for treatment procedures.   Mechanical debridement of nails 1-5  bilaterally performed with a nail nipper.  Filed with dremel without incident. Debridement of porokeratosis with #15 blade.  Return office visit   p3 months                  Told patient to return for periodic foot care and evaluation due to potential at risk complications.   Gardiner Barefoot DPM

## 2021-06-03 ENCOUNTER — Encounter: Payer: Self-pay | Admitting: Physician Assistant

## 2021-06-03 ENCOUNTER — Ambulatory Visit (INDEPENDENT_AMBULATORY_CARE_PROVIDER_SITE_OTHER): Payer: Medicare Other | Admitting: Physician Assistant

## 2021-06-03 VITALS — BP 110/60 | HR 72 | Ht 61.5 in | Wt 140.0 lb

## 2021-06-03 DIAGNOSIS — D649 Anemia, unspecified: Secondary | ICD-10-CM | POA: Diagnosis not present

## 2021-06-03 NOTE — Progress Notes (Signed)
Reviewed and agree with documentation and assessment and plan. K. Veena Filipe Greathouse , MD   

## 2021-06-03 NOTE — Progress Notes (Signed)
Chief Complaint: Follow-up hospitalization for GI bleed  HPI:    Susan [name] is an 80 year old African-American female with a past medical history of reflux, myelodysplasia, CKD stage III, mild dementia, GERD, upper GI bleed/melena 4/22 and diverticular bleed 6/22 and others listed below, known to Dr. Silverio Decamp, who presents clinic today for follow-up after recent hospitalization for GI bleed.    05/04/2021 patient consulted by our service for GI bleed with melena and anemia.  At that time discussed her history of an upper GI bleed 4/22 and diverticular bleed status post angiography with coil in position of a colic branch of the SMA 6/22.  At admission her hemoglobin was 5.8, BUN 99, transfused 2 units PRBCs, hemoglobin increased to 9.3.  Patient was scheduled for small bowel enteroscopy.    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  At that time patient was changed to Pantoprazole 40 mg twice daily.  Pathology returned with Candida esophagitis.  Patient started on fluconazole 400 mg on day 1, then 200 mg for 13 more days.  MiraLAX had also been added daily for some constipation.    05/14/2021 CBC with a hemoglobin of 8.5 (9 on 8/4).  CMP with a BUN of 30 (28/3/22). Aranesp infusion that day at cancer center.    Today, patient presents to clinic accompanied by her husband who assists with history.  She tells me that she is doing just fine and has no complaints.  She "thanks the Lord", that she is having no further bleeding.  Her bowel movements are normal.  Continues on Pantoprazole 40 mg twice a day and Pepcid 20 mg twice a day.    Denies fever, chills, weight loss, further bleeding or abdominal pain.  Previous GI work-up: Abdominal/pelvic CT angiogram 03/13/2021: 1. Positive  for active diverticular bleed arising from the proximal ascending colon in the right lower quadrant. 2. Extensive colonic diverticulosis without evidence of active inflammation. 3. Small volume free fluid in the anatomic pelvis is nonspecific but abnormal for a post reproductive age female. 4. Tortuous and atherosclerotic abdominal aorta. Aortic Atherosclerosis (ICD10-I70.0). 5. Cardiomegaly. 6. Stable chronic L1 compression fracture. 7. Multilevel degenerative disc disease with grade 1 anterolisthesis of L5 on S1.   EGD 01/09/2021: - The GE junction was friable in a spoke like pattern, and there was a small amount of red blood at the site. This may represent reflux esophagitis. - Mild, non-specific gastritis. Biopsied to check for H. Pylori A. GASTRIC, BIOPSY:  - Gastric antral and oxyntic mucosa with mild chronic gastritis  - Warthin Starry stain is negative for Helicobacter pylori    Colonoscopy 02/10/2017: - Severe diverticulosis in the sigmoid colon, in the descending colon, in the transverse colon and in the ascending colon. There was narrowing of the colon in association with the diverticular opening. There was evidence of diverticular spasm. Peri-diverticular erythema was seen. There was evidence of an impacted diverticulum. Petechia were visualized in association with the diverticular opening. - Non-bleeding internal hemorrhoids. - The examination was otherwise normal. - No specimens collected.   Past Medical History:  Diagnosis Date   Anemia    Arthritis    Blood transfusion without reported diagnosis    GERD (gastroesophageal reflux disease)    GI bleed    HLD (hyperlipidemia)  Hypertension    Hypothyroidism    Memory loss    Myelodysplasia    Vitamin D deficiency     Past Surgical History:  Procedure Laterality Date   BIOPSY  01/09/2021   Procedure: BIOPSY;  Surgeon: Milus Banister, MD;  Location: [facility];  Service: Endoscopy;;   BIOPSY  05/04/2021    Procedure: BIOPSY;  Surgeon: Ladene Artist, MD;  Location: [facility];  Service: Endoscopy;;   CARPAL TUNNEL RELEASE Right    DILATION AND CURETTAGE OF UTERUS     ENTEROSCOPY N/A 05/04/2021   Procedure: ENTEROSCOPY;  Surgeon: Ladene Artist, MD;  Location: [facility];  Service: Endoscopy;  Laterality: N/A;   ESOPHAGOGASTRODUODENOSCOPY (EGD) WITH PROPOFOL N/A 01/09/2021   Procedure: ESOPHAGOGASTRODUODENOSCOPY (EGD) WITH PROPOFOL;  Surgeon: Milus Banister, MD;  Location: [facility];  Service: Endoscopy;  Laterality: N/A;   HOT HEMOSTASIS N/A 05/04/2021   Procedure: HOT HEMOSTASIS (ARGON PLASMA COAGULATION/BICAP);  Surgeon: Ladene Artist, MD;  Location: [facility];  Service: Endoscopy;  Laterality: N/A;   INGUINAL HERNIA REPAIR     IR ANGIOGRAM FOLLOW UP STUDY  03/13/2021   IR ANGIOGRAM VISCERAL SELECTIVE  03/13/2021   IR EMBO ART  VEN HEMORR LYMPH EXTRAV  INC GUIDE ROADMAPPING  03/13/2021   IR US GUIDE VASC ACCESS RIGHT  03/13/2021   ROTATOR CUFF REPAIR Left    SUBMUCOSAL TATTOO INJECTION  05/04/2021   Procedure: SUBMUCOSAL TATTOO INJECTION;  Surgeon: Ladene Artist, MD;  Location: [facility];  Service: Endoscopy;;   TRANSANAL HEMORRHOIDAL DEARTERIALIZATION N/A 03/31/2017   Procedure: TRANSANAL HEMORRHOIDAL DEARTERIALIZATION;  Surgeon: Leighton Ruff, MD;  Location: [facility];  Service: General;  Laterality: N/A;   UMBILICAL HERNIA REPAIR      Current Outpatient Medications  Medication Sig Dispense Refill   acetaminophen (TYLENOL) 500 MG tablet Take 500 mg by mouth every 6 (six) hours as needed for moderate pain (for pain.).     allopurinol (ZYLOPRIM) 300 MG tablet Take 300 mg by mouth daily.     amLODipine (NORVASC) 10 MG tablet TAKE 1/2 TABLET BY MOUTH DAILY AT BEDTIME; (TAKE 1 TABLET IF SBP IS >150) 90 tablet 0   aspirin 81 MG chewable tablet Chew 81 mg by mouth daily.     donepezil (ARICEPT) 10 MG tablet Take 10 mg by mouth daily.     famotidine (PEPCID) 20 MG tablet Take 20 mg  by mouth 2 (two) times daily.     labetalol (NORMODYNE) 200 MG tablet Take 200 mg by mouth 2 (two) times daily.     levothyroxine (SYNTHROID) 75 MCG tablet Take 75 mcg by mouth every morning.     memantine (NAMENDA) 10 MG tablet Take 1 tablet (10 mg total) by mouth 2 (two) times daily. 60 tablet 11   mirtazapine (REMERON) 15 MG tablet Take 15 mg by mouth at bedtime.     montelukast (SINGULAIR) 10 MG tablet Take 10 mg by mouth at bedtime.     pantoprazole (PROTONIX) 40 MG tablet Take 1 tablet (40 mg total) by mouth daily. 30 tablet 3   traMADol (ULTRAM) 50 MG tablet Take 50 mg by mouth every 6 (six) hours as needed for moderate pain.     No current facility-administered medications for this visit.    Allergies as of 06/03/2021 - Review Complete 06/03/2021  Allergen Reaction Noted   Codeine Anaphylaxis 12/28/2011   Penicillins Anaphylaxis 12/28/2011   Bactrim Itching and Swelling 12/28/2011   Clarithromycin Other (See Comments) 12/28/2011  Flagyl [metronidazole hcl] Itching and Swelling 12/28/2011   Food Other (See Comments) 03/24/2017   Pineapple Itching and Swelling 03/24/2017   Sulfa antibiotics Itching and Swelling 12/28/2011    Family History  Problem Relation Age of Onset   Stroke Father    Hypertension Father    Other Mother        died at early age   Stroke Sister    Coronary artery disease Brother    Hypertension Sister    Bladder Cancer Sister    Colon cancer Neg Hx    Stomach cancer Neg Hx     Social History   Socioeconomic History   Marital status: Single    Spouse name: Not on file   Number of children: 1   Years of education: college   Highest education level: Bachelor's degree (e.g., BA, AB, BS)  Occupational History   Occupation: retired    Fish farm manager: RETIRED  Tobacco Use   Smoking status: Former    Types: Cigarettes    Quit date: 10/02/1996    Years since quitting: 24.6   Smokeless tobacco: Never  Vaping Use   Vaping Use: Never used  Substance  and Sexual Activity   Alcohol use: Yes    Comment: glass of wine at least once a month.   Drug use: No   Sexual activity: Not Currently    Partners: Male  Other Topics Concern   Not on file  Social History Narrative   Lives at home alone.   Right-handed.   No daily use of caffeine.   Social Determinants of Health   Financial Resource Strain: Not on file  Food Insecurity: Not on file  Transportation Needs: Not on file  Physical Activity: Not on file  Stress: Not on file  Social Connections: Not on file  Intimate Partner Violence: Not on file    Review of Systems:    Constitutional: No weight loss, fever or chills Cardiovascular: No chest pain Respiratory: No SOB  Gastrointestinal: See HPI and otherwise negative   Physical Exam:  Vital signs: BP 110/60 (BP Location: Left Arm, Patient Position: Sitting, Cuff Size: Normal)   Pulse 72   Ht 5' 1.5" (1.562 m)   Wt 140 lb (63.5 kg)   BMI 26.02 kg/m   Constitutional:   Pleasant AA female appears to be in NAD, Well developed, Well nourished, alert and cooperative Respiratory: Respirations even and unlabored. Lungs clear to auscultation bilaterally.   No wheezes, crackles, or rhonchi.  Cardiovascular: Normal S1, S2. No MRG. Regular rate and rhythm. No peripheral edema, cyanosis or pallor.  Gastrointestinal:  Soft, nondistended, nontender. No rebound or guarding. Normal bowel sounds. No appreciable masses or hepatomegaly. Rectal:  Not performed.  Psychiatric:  Demonstrates good judgement and reason without abnormal affect or behaviors.  RELEVANT LABS AND IMAGING: CBC    Component Value Date/Time   WBC 4.2 05/14/2021 0943   WBC 6.1 05/06/2021 0540   RBC 3.23 (L) 05/14/2021 0943   HGB 8.5 (L) 05/14/2021 0943   HGB 10.3 (L) 09/22/2017 0813   HCT 25.8 (L) 05/14/2021 0943   HCT 30.4 (L) 09/22/2017 0813   PLT 121 (L) 05/14/2021 0943   PLT 140 (L) 09/22/2017 0813   MCV 79.9 (L) 05/14/2021 0943   MCV 84.3 09/22/2017 0813    MCH 26.3 05/14/2021 0943   MCHC 32.9 05/14/2021 0943   RDW 17.7 (H) 05/14/2021 0943   RDW 15.2 (H) 09/22/2017 0813   LYMPHSABS 1.5 05/14/2021 0943   LYMPHSABS  1.6 09/22/2017 0813   MONOABS 0.3 05/14/2021 0943   MONOABS 0.2 09/22/2017 0813   EOSABS 0.1 05/14/2021 0943   EOSABS 0.1 09/22/2017 0813   BASOSABS 0.0 05/14/2021 0943   BASOSABS 0.0 09/22/2017 0813    CMP     Component Value Date/Time   NA 142 05/14/2021 0943   NA 143 06/30/2017 0846   K 4.1 05/14/2021 0943   K 4.4 06/30/2017 0846   CL 112 (H) 05/14/2021 0943   CO2 21 (L) 05/14/2021 0943   CO2 24 06/30/2017 0846   GLUCOSE 104 (H) 05/14/2021 0943   GLUCOSE 89 06/30/2017 0846   BUN 30 (H) 05/14/2021 0943   BUN 26.5 (H) 06/30/2017 0846   CREATININE 2.34 (H) 05/14/2021 0943   CREATININE 1.31 (H) 07/02/2018 0950   CREATININE 1.4 (H) 06/30/2017 0846   CALCIUM 9.8 05/14/2021 0943   CALCIUM 10.1 06/30/2017 0846   PROT 7.8 05/14/2021 0943   PROT 7.6 06/30/2017 0846   ALBUMIN 4.0 05/14/2021 0943   ALBUMIN 4.1 06/30/2017 0846   AST 17 05/14/2021 0943   AST 17 06/30/2017 0846   ALT 12 05/14/2021 0943   ALT <6 06/30/2017 0846   ALKPHOS 126 05/14/2021 0943   ALKPHOS 101 06/30/2017 0846   BILITOT 0.4 05/14/2021 0943   BILITOT 0.76 06/30/2017 0846   GFRNONAA 20 (L) 05/14/2021 0943   GFRNONAA 39 (L) 07/02/2018 0950   GFRAA 30 (L) 05/22/2020 1123   GFRAA 45 (L) 07/02/2018 0950    Assessment: 1.  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  Continue to follow the cancer center for regular labs 2.  Continue Pantoprazole 40 mg twice daily 3.  Continue Famotidine 20 mg twice daily 4.  Patient to follow in clinic with [place] as needed.  Ellouise Newer, PA-C [facility] 06/03/2021, 11:06 AM  Cc: Sonia Side., [name]

## 2021-06-03 NOTE — Patient Instructions (Addendum)
Follow up as needed.  If you are age 82 or older, your body mass index should be between 23-30. Your Body mass index is 26.02 kg/m. If this is out of the aforementioned range listed, please consider follow up with your Primary Care Provider.  If you are age 75 or younger, your body mass index should be between 19-25. Your Body mass index is 26.02 kg/m. If this is out of the aformentioned range listed, please consider follow up with your Primary Care Provider.   __________________________________________________________  The [facility] providers would like to encourage you to use [facility] to communicate with providers for non-urgent requests or questions.  Due to long hold times on the telephone, sending your provider a message by [facility] may be a faster and more efficient way to get a response.  Please allow 48 business hours for a response.  Please remember that this is for non-urgent requests.

## 2021-06-09 ENCOUNTER — Telehealth: Payer: Self-pay | Admitting: Oncology

## 2021-06-11 ENCOUNTER — Other Ambulatory Visit: Payer: Self-pay

## 2021-06-11 ENCOUNTER — Inpatient Hospital Stay: Payer: Medicare Other

## 2021-06-11 ENCOUNTER — Inpatient Hospital Stay: Payer: Medicare Other | Attending: Oncology

## 2021-06-11 VITALS — BP 124/75 | HR 74 | Temp 98.5°F | Resp 14

## 2021-06-11 DIAGNOSIS — D469 Myelodysplastic syndrome, unspecified: Secondary | ICD-10-CM | POA: Insufficient documentation

## 2021-06-11 DIAGNOSIS — D631 Anemia in chronic kidney disease: Secondary | ICD-10-CM | POA: Insufficient documentation

## 2021-06-11 DIAGNOSIS — N1831 Chronic kidney disease, stage 3a: Secondary | ICD-10-CM | POA: Insufficient documentation

## 2021-06-11 DIAGNOSIS — E611 Iron deficiency: Secondary | ICD-10-CM | POA: Diagnosis not present

## 2021-06-11 DIAGNOSIS — N189 Chronic kidney disease, unspecified: Secondary | ICD-10-CM

## 2021-06-11 LAB — CMP (CANCER CENTER ONLY)
ALT: 9 U/L (ref 0–44)
AST: 19 U/L (ref 15–41)
Albumin: 4.1 g/dL (ref 3.5–5.0)
Alkaline Phosphatase: 128 U/L — ABNORMAL HIGH (ref 38–126)
Anion gap: 10 (ref 5–15)
BUN: 25 mg/dL — ABNORMAL HIGH (ref 8–23)
CO2: 24 mmol/L (ref 22–32)
Calcium: 10.2 mg/dL (ref 8.9–10.3)
Chloride: 109 mmol/L (ref 98–111)
Creatinine: 1.8 mg/dL — ABNORMAL HIGH (ref 0.44–1.00)
GFR, Estimated: 28 mL/min — ABNORMAL LOW (ref >60)
Glucose, Bld: 86 mg/dL (ref 70–99)
Potassium: 4.3 mmol/L (ref 3.5–5.1)
Sodium: 143 mmol/L (ref 135–145)
Total Bilirubin: 0.4 mg/dL (ref 0.3–1.2)
Total Protein: 8.4 g/dL — ABNORMAL HIGH (ref 6.5–8.1)

## 2021-06-11 LAB — CBC WITH DIFFERENTIAL (CANCER CENTER ONLY)
Abs Immature Granulocytes: 0.01 10*3/uL (ref 0.00–0.07)
Basophils Absolute: 0 10*3/uL (ref 0.0–0.1)
Basophils Relative: 0 %
Eosinophils Absolute: 0 10*3/uL (ref 0.0–0.5)
Eosinophils Relative: 1 %
HCT: 23.3 % — ABNORMAL LOW (ref 36.0–46.0)
Hemoglobin: 7.6 g/dL — ABNORMAL LOW (ref 12.0–15.0)
Immature Granulocytes: 0 %
Lymphocytes Relative: 33 %
Lymphs Abs: 1.2 10*3/uL (ref 0.7–4.0)
MCH: 25.4 pg — ABNORMAL LOW (ref 26.0–34.0)
MCHC: 32.6 g/dL (ref 30.0–36.0)
MCV: 77.9 fL — ABNORMAL LOW (ref 80.0–100.0)
Monocytes Absolute: 0.2 10*3/uL (ref 0.1–1.0)
Monocytes Relative: 5 %
Neutro Abs: 2.3 10*3/uL (ref 1.7–7.7)
Neutrophils Relative %: 61 %
Platelet Count: 95 10*3/uL — ABNORMAL LOW (ref 150–400)
RBC: 2.99 MIL/uL — ABNORMAL LOW (ref 3.87–5.11)
RDW: 19 % — ABNORMAL HIGH (ref 11.5–15.5)
WBC Count: 3.7 10*3/uL — ABNORMAL LOW (ref 4.0–10.5)
nRBC: 0 % (ref 0.0–0.2)

## 2021-06-11 LAB — SAMPLE TO BLOOD BANK

## 2021-06-11 MED ORDER — DARBEPOETIN ALFA 500 MCG/ML IJ SOSY
500.0000 ug | PREFILLED_SYRINGE | Freq: Once | INTRAMUSCULAR | Status: AC
  Administered 2021-06-11: 500 ug via SUBCUTANEOUS
  Filled 2021-06-11: qty 1

## 2021-06-21 ENCOUNTER — Other Ambulatory Visit: Payer: Self-pay

## 2021-06-21 ENCOUNTER — Encounter (HOSPITAL_COMMUNITY): Payer: Self-pay

## 2021-06-21 ENCOUNTER — Ambulatory Visit (INDEPENDENT_AMBULATORY_CARE_PROVIDER_SITE_OTHER): Payer: Medicare Other

## 2021-06-21 ENCOUNTER — Ambulatory Visit (HOSPITAL_COMMUNITY)
Admission: EM | Admit: 2021-06-21 | Discharge: 2021-06-21 | Disposition: A | Discharge status: Home / Self Care | Payer: Medicare Other | Attending: Physician Assistant | Admitting: Physician Assistant

## 2021-06-21 DIAGNOSIS — Z87891 Personal history of nicotine dependence: Secondary | ICD-10-CM | POA: Diagnosis not present

## 2021-06-21 DIAGNOSIS — Z79890 Hormone replacement therapy (postmenopausal): Secondary | ICD-10-CM | POA: Diagnosis not present

## 2021-06-21 DIAGNOSIS — R0981 Nasal congestion: Secondary | ICD-10-CM | POA: Diagnosis present

## 2021-06-21 DIAGNOSIS — J069 Acute upper respiratory infection, unspecified: Secondary | ICD-10-CM | POA: Insufficient documentation

## 2021-06-21 DIAGNOSIS — Z79899 Other long term (current) drug therapy: Secondary | ICD-10-CM | POA: Diagnosis not present

## 2021-06-21 DIAGNOSIS — Z20822 Contact with and (suspected) exposure to covid-19: Secondary | ICD-10-CM | POA: Diagnosis not present

## 2021-06-21 DIAGNOSIS — N1832 Chronic kidney disease, stage 3b: Secondary | ICD-10-CM | POA: Diagnosis not present

## 2021-06-21 DIAGNOSIS — Z7982 Long term (current) use of aspirin: Secondary | ICD-10-CM | POA: Diagnosis not present

## 2021-06-21 DIAGNOSIS — R059 Cough, unspecified: Secondary | ICD-10-CM | POA: Insufficient documentation

## 2021-06-21 DIAGNOSIS — I129 Hypertensive chronic kidney disease with stage 1 through stage 4 chronic kidney disease, or unspecified chronic kidney disease: Secondary | ICD-10-CM | POA: Diagnosis not present

## 2021-06-21 LAB — POC INFLUENZA A AND B ANTIGEN (URGENT CARE ONLY)
INFLUENZA A ANTIGEN, POC: NEGATIVE
INFLUENZA B ANTIGEN, POC: NEGATIVE

## 2021-06-21 MED ORDER — ALBUTEROL SULFATE HFA 108 (90 BASE) MCG/ACT IN AERS
INHALATION_SPRAY | RESPIRATORY_TRACT | Status: AC
  Filled 2021-06-21: qty 6.7

## 2021-06-21 MED ORDER — ALBUTEROL SULFATE HFA 108 (90 BASE) MCG/ACT IN AERS
2.0000 | INHALATION_SPRAY | Freq: Once | RESPIRATORY_TRACT | Status: AC
  Administered 2021-06-21: 2 via RESPIRATORY_TRACT

## 2021-06-21 MED ORDER — BENZONATATE 100 MG PO CAPS: 100.0000 mg | ORAL_CAPSULE | Freq: Three times a day (TID) | ORAL | 0 refills | Status: DC

## 2021-06-21 NOTE — Discharge Instructions (Addendum)
Your flu test was negative.  Your x-ray did not show any signs of infection which is great news.  I am concerned that you might have COVID.  Please send this off for testing we will contact you if this is positive.  Please use over-the-counter medications including Mucinex and Tessalon for symptom relief.  If you have any worsening symptoms including high fever, worsening cough, shortness of breath, nausea/vomiting, chest pain you need to be seen immediately.  Please follow-up with either our clinic or your primary care provider within 2 to 3 days to ensure symptom improvement.  If you are positive for COVID and interested in starting antivirals please let our nurse know when she calls you with your results.

## 2021-06-21 NOTE — ED Triage Notes (Signed)
Pt reports low appetite, nasal congestion and cough x 3 days.

## 2021-06-21 NOTE — ED Provider Notes (Signed)
[place]    CSN: [id number] Arrival date & time: 06/21/21  1558      History   Chief Complaint Chief Complaint  Patient presents with   Cough   Nasal Congestion    HPI Susan [name] is a 80 y.o. female.   Patient presents today with a 3-day history of URI symptoms.  She reports associated nasal congestion, cough, fatigue, decreased appetite.  She denies any chest pain, shortness of breath, nausea, vomiting.  She has not tried any over-the-counter medication for symptom management.  She did this to her doctor and was tested for COVID-19 on Thursday which was the day before symptoms began and was negative at that time.  She denies history of having COVID in the past.  She is up-to-date on COVID-19 vaccinations including 2 boosters.  She reports significant nasal congestion that is interfering with her ability to perform daily activities including sleep through the night.  She denies any history of chronic lung condition including asthma, COPD, smoking.  She denies history of diabetes or heart disease but does have a history of chronic kidney disease.    Past Medical History:  Diagnosis Date   Anemia    Arthritis    Blood transfusion without reported diagnosis    GERD (gastroesophageal reflux disease)    GI bleed    HLD (hyperlipidemia)    Hypertension    Hypothyroidism    Memory loss    Myelodysplasia    Vitamin D deficiency     Patient Active Problem List   Diagnosis Date Noted   Gastritis without bleeding    Melena    Acute GI bleeding 05/03/2021   Syncope 05/03/2021   Acute renal failure superimposed on stage 3b chronic kidney disease ([place]) 05/03/2021   Thrombocytopenia ([place]) 05/03/2021   Acute on chronic anemia 03/13/2021   Allergic rhinitis 03/13/2021   Acute lower GI bleeding 03/12/2021   Symptomatic anemia 01/08/2021   Cerebral vascular disease 06/09/2020   Pain due to onychomycosis of toenails of both feet 04/08/2020   Porokeratosis 04/08/2020    Dementia without behavioral disturbance ([place]) 02/27/2020   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   BMI 29.0-29.9,adult 09/02/2015   Encounter for Medicare annual wellness exam 06/02/2015   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   CKD, 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) 11/02/2014   Medication management 01/27/2014   Hyperlipidemia 08/27/2013   Essential hypertension    Hypothyroidism    GERD (gastroesophageal reflux disease)    Vitamin D deficiency    Hemorrhoids 07/15/2013    Past Surgical History:  Procedure Laterality Date   BIOPSY  01/09/2021   Procedure: BIOPSY;  Surgeon: Milus Banister, MD;  Location: [facility];  Service: Endoscopy;;   BIOPSY  05/04/2021   Procedure: BIOPSY;  Surgeon: Ladene Artist, MD;  Location: [facility];  Service: Endoscopy;;   CARPAL TUNNEL RELEASE Right    DILATION AND CURETTAGE OF UTERUS     ENTEROSCOPY N/A 05/04/2021   Procedure: ENTEROSCOPY;  Surgeon: Ladene Artist, MD;  Location: [facility];  Service: Endoscopy;  Laterality: N/A;   ESOPHAGOGASTRODUODENOSCOPY (EGD) WITH PROPOFOL N/A 01/09/2021   Procedure: ESOPHAGOGASTRODUODENOSCOPY (EGD) WITH PROPOFOL;  Surgeon: Milus Banister, MD;  Location: [facility];  Service: Endoscopy;  Laterality: N/A;   HOT HEMOSTASIS N/A 05/04/2021   Procedure: HOT HEMOSTASIS (ARGON PLASMA COAGULATION/BICAP);  Surgeon: Ladene Artist, MD;  Location: [facility];  Service: Endoscopy;  Laterality: N/A;   INGUINAL HERNIA REPAIR  IR ANGIOGRAM FOLLOW UP STUDY  03/13/2021   IR ANGIOGRAM VISCERAL SELECTIVE  03/13/2021   IR EMBO ART  VEN HEMORR LYMPH EXTRAV  INC GUIDE ROADMAPPING  03/13/2021   IR US GUIDE VASC ACCESS RIGHT  03/13/2021   ROTATOR CUFF REPAIR Left    SUBMUCOSAL TATTOO INJECTION  05/04/2021   Procedure: SUBMUCOSAL TATTOO INJECTION;  Surgeon: Ladene Artist, MD;  Location: [facility];  Service: Endoscopy;;   TRANSANAL HEMORRHOIDAL DEARTERIALIZATION N/A 03/31/2017   Procedure: TRANSANAL HEMORRHOIDAL  DEARTERIALIZATION;  Surgeon: Leighton Ruff, MD;  Location: [facility];  Service: General;  Laterality: N/A;   UMBILICAL HERNIA REPAIR      OB History   No obstetric history on file.      Home Medications    Prior to Admission medications   Medication Sig Start Date End Date Taking? Authorizing Provider  benzonatate (TESSALON) 100 MG capsule Take 1 capsule (100 mg total) by mouth every 8 (eight) hours. 06/21/21  Yes Tyhir Schwan, Derry Skill, PA-C  acetaminophen (TYLENOL) 500 MG tablet Take 500 mg by mouth every 6 (six) hours as needed for moderate pain (for pain.).    [provider]  allopurinol (ZYLOPRIM) 300 MG tablet Take 300 mg by mouth daily. 07/20/20   [provider]  amLODipine (NORVASC) 10 MG tablet TAKE 1/2 TABLET BY MOUTH DAILY AT BEDTIME; (TAKE 1 TABLET IF SBP IS >150) 07/20/17   [name], NP  aspirin 81 MG chewable tablet Chew 81 mg by mouth daily.    [provider]  donepezil (ARICEPT) 10 MG tablet Take 10 mg by mouth daily. 07/20/20   [provider]  famotidine (PEPCID) 20 MG tablet Take 20 mg by mouth 2 (two) times daily. 03/08/21   [provider]  labetalol (NORMODYNE) 200 MG tablet Take 200 mg by mouth 2 (two) times daily. 06/23/20   [provider]  levothyroxine (SYNTHROID) 75 MCG tablet Take 75 mcg by mouth every morning. 12/18/20   [provider]  memantine (NAMENDA) 10 MG tablet Take 1 tablet (10 mg total) by mouth 2 (two) times daily. 11/30/20   [name], NP  mirtazapine (REMERON) 15 MG tablet Take 15 mg by mouth at bedtime. 07/20/20   [provider]  montelukast (SINGULAIR) 10 MG tablet Take 10 mg by mouth at bedtime.    [provider]  pantoprazole (PROTONIX) 40 MG tablet Take 1 tablet (40 mg total) by mouth daily. 05/06/21   [name], MD  traMADol (ULTRAM) 50 MG tablet Take 50 mg by mouth every 6 (six) hours as needed for moderate pain.    [provider]    Family  History Family History  Problem Relation Age of Onset   Stroke Father    Hypertension Father    Other Mother        died at early age   Stroke Sister    Coronary artery disease Brother    Hypertension Sister    Bladder Cancer Sister    Colon cancer Neg Hx    Stomach cancer Neg Hx     Social History Social History   Tobacco Use   Smoking status: Former    Types: Cigarettes    Quit date: 10/02/1996    Years since quitting: 24.7   Smokeless tobacco: Never  Vaping Use   Vaping Use: Never used  Substance Use Topics   Alcohol use: Yes    Comment: glass of wine at least once a month.   Drug  use: No     Allergies   Codeine, Penicillins, Bactrim, Clarithromycin, Flagyl [metronidazole hcl], Food, Pineapple, and Sulfa antibiotics   Review of Systems Review of Systems  Constitutional:  Positive for activity change, appetite change and fatigue. Negative for fever.  HENT:  Positive for congestion. Negative for sinus pressure, sneezing and sore throat.   Respiratory:  Positive for cough. Negative for shortness of breath.   Cardiovascular:  Negative for chest pain.  Gastrointestinal:  Negative for abdominal pain, diarrhea, nausea and vomiting.  Musculoskeletal:  Negative for arthralgias and myalgias.  Neurological:  Positive for headaches. Negative for dizziness and light-headedness.    Physical Exam Triage Vital Signs ED Triage Vitals  Enc Vitals Group     BP 06/21/21 1726 128/74     Pulse Rate 06/21/21 1726 70     Resp 06/21/21 1726 20     Temp 06/21/21 1726 98.2 F (36.8 C)     Temp Source 06/21/21 1726 Oral     SpO2 06/21/21 1726 98 %     Weight --      Height --      Head Circumference --      Peak Flow --      Pain Score 06/21/21 1725 0     Pain Loc --      Pain Edu? --      Excl. in [place]? --    No data found.  Updated Vital Signs BP 128/74 (BP Location: Right Arm)   Pulse 70   Temp 98.2 F (36.8 C) (Oral)   Resp 20   SpO2 98%   Visual Acuity Right  Eye Distance:   Left Eye Distance:   Bilateral Distance:    Right Eye Near:   Left Eye Near:    Bilateral Near:     Physical Exam Vitals reviewed.  Constitutional:      General: She is awake. She is not in acute distress.    Appearance: Normal appearance. She is well-developed. She is not ill-appearing.     Comments: Very pleasant female appears stated age in no acute distress sitting comfortably in exam room  HENT:     Head: Normocephalic and atraumatic.     Right Ear: Tympanic membrane, ear canal and external ear normal. Tympanic membrane is not erythematous or bulging.     Left Ear: Tympanic membrane, ear canal and external ear normal. Tympanic membrane is not erythematous or bulging.     Nose:     Right Sinus: No maxillary sinus tenderness or frontal sinus tenderness.     Left Sinus: No maxillary sinus tenderness or frontal sinus tenderness.     Mouth/Throat:     Pharynx: Uvula midline. No oropharyngeal exudate or posterior oropharyngeal erythema.  Cardiovascular:     Rate and Rhythm: Normal rate and regular rhythm.     Heart sounds: Normal heart sounds, S1 normal and S2 normal. No murmur heard. Pulmonary:     Effort: Pulmonary effort is normal.     Breath sounds: Examination of the right-lower field reveals rales. Examination of the left-lower field reveals rales. Rales present. No wheezing or rhonchi.     Comments: Rales noted in bilateral lower legs Psychiatric:        Behavior: Behavior is cooperative.     UC Treatments / Results  Labs (all labs ordered are listed, but only abnormal results are displayed) Labs Reviewed  SARS CORONAVIRUS 2 (TAT 6-24 HRS)  POC INFLUENZA A AND B ANTIGEN (URGENT CARE ONLY)  EKG   Radiology DG Chest 2 View  Result Date: 06/21/2021 CLINICAL DATA:  Cough. EXAM: CHEST - 2 VIEW COMPARISON:  January 08, 2021 FINDINGS: The lungs are mildly hyperinflated. Chronic appearing increased lung markings are seen with mild, stable left basilar  linear scarring. There is no evidence of a pleural effusion or pneumothorax. The heart size and mediastinal contours are within normal limits. A radiopaque surgical screw is seen within the left humeral head. The visualized skeletal structures are otherwise unremarkable. IMPRESSION: Stable exam without acute cardiopulmonary disease. Electronically Signed   By: Virgina Norfolk M.D.   On: 06/21/2021 18:35    Procedures Procedures (including critical care time)  Medications Ordered in UC Medications  albuterol (VENTOLIN HFA) 108 (90 Base) MCG/ACT inhaler 2 puff (2 puffs Inhalation Given 06/21/21 1841)    Initial Impression / Assessment and Plan / UC Course  I have reviewed the triage vital signs and the nursing notes.  Pertinent labs & imaging results that were available during my care of the patient were reviewed by me and considered in my medical decision making (see chart for details).      Discussed likely viral etiology given short duration of symptoms.  Patient was tested for flu which was negative today.  COVID-19 test is pending.  She was given albuterol with improvement of symptoms.  Chest x-ray obtained given exam showed no acute abnormality.  Patient was given Tessalon for cough.  She was instructed to use over-the-counter medications including Mucinex and Tylenol for symptom relief.  No evidence of acute infection that warrant initiation of antibiotics today.  Discussed that she is eligible for oral antivirals given advanced age and comorbidities and so if she is interested in starting these medications in the future if she does positive for COVID-19 she is to inform our nursing staff and we can arrange this for her.  Due to history of chronic kidney disease she would need to use molnupiravir as calculated creatinine clearance from 06/11/2021 was 24.57 mL/min.  Discussed alarm symptoms that warrant emergent evaluation.  Strict return precautions given to which she expressed  understanding.  Final Clinical Impressions(s) / UC Diagnoses   Final diagnoses:  Upper respiratory tract infection, unspecified type  Nasal congestion  Cough     Discharge Instructions      Your flu test was negative.  Your x-ray did not show any signs of infection which is great news.  I am concerned that you might have COVID.  Please send this off for testing we will contact you if this is positive.  Please use over-the-counter medications including Mucinex and Tessalon for symptom relief.  If you have any worsening symptoms including high fever, worsening cough, shortness of breath, nausea/vomiting, chest pain you need to be seen immediately.  Please follow-up with either our clinic or your primary care provider within 2 to 3 days to ensure symptom improvement.  If you are positive for COVID and interested in starting antivirals please let our nurse know when she calls you with your results.     ED Prescriptions     Medication Sig Dispense Auth. Provider   benzonatate (TESSALON) 100 MG capsule Take 1 capsule (100 mg total) by mouth every 8 (eight) hours. 21 capsule Meika Earll K, PA-C      PDMP not reviewed this encounter.   Terrilee Croak, PA-C 06/21/21 1908

## 2021-06-22 LAB — SARS CORONAVIRUS 2 (TAT 6-24 HRS): SARS Coronavirus 2: NEGATIVE

## 2021-07-16 ENCOUNTER — Inpatient Hospital Stay: Payer: Medicare Other

## 2021-07-16 ENCOUNTER — Other Ambulatory Visit: Payer: Self-pay

## 2021-07-16 ENCOUNTER — Inpatient Hospital Stay: Payer: Medicare Other | Attending: Oncology | Admitting: Oncology

## 2021-07-16 ENCOUNTER — Other Ambulatory Visit: Payer: Medicare Other

## 2021-07-16 ENCOUNTER — Other Ambulatory Visit: Payer: Self-pay | Admitting: *Deleted

## 2021-07-16 ENCOUNTER — Ambulatory Visit: Payer: Medicare Other

## 2021-07-16 VITALS — BP 102/68 | HR 74 | Temp 97.8°F | Resp 16 | Ht 61.5 in | Wt 133.5 lb

## 2021-07-16 DIAGNOSIS — R5383 Other fatigue: Secondary | ICD-10-CM | POA: Diagnosis not present

## 2021-07-16 DIAGNOSIS — D696 Thrombocytopenia, unspecified: Secondary | ICD-10-CM | POA: Diagnosis not present

## 2021-07-16 DIAGNOSIS — N1831 Chronic kidney disease, stage 3a: Secondary | ICD-10-CM | POA: Diagnosis not present

## 2021-07-16 DIAGNOSIS — D631 Anemia in chronic kidney disease: Secondary | ICD-10-CM

## 2021-07-16 DIAGNOSIS — R0609 Other forms of dyspnea: Secondary | ICD-10-CM | POA: Diagnosis not present

## 2021-07-16 DIAGNOSIS — Z79899 Other long term (current) drug therapy: Secondary | ICD-10-CM | POA: Insufficient documentation

## 2021-07-16 DIAGNOSIS — N189 Chronic kidney disease, unspecified: Secondary | ICD-10-CM | POA: Diagnosis not present

## 2021-07-16 LAB — CBC WITH DIFFERENTIAL (CANCER CENTER ONLY)
Abs Immature Granulocytes: 0.01 10*3/uL (ref 0.00–0.07)
Basophils Absolute: 0 10*3/uL (ref 0.0–0.1)
Basophils Relative: 0 %
Eosinophils Absolute: 0 10*3/uL (ref 0.0–0.5)
Eosinophils Relative: 1 %
HCT: 18.6 % — ABNORMAL LOW (ref 36.0–46.0)
Hemoglobin: 5.8 g/dL — CL (ref 12.0–15.0)
Immature Granulocytes: 0 %
Lymphocytes Relative: 33 %
Lymphs Abs: 1.3 10*3/uL (ref 0.7–4.0)
MCH: 23.2 pg — ABNORMAL LOW (ref 26.0–34.0)
MCHC: 31.2 g/dL (ref 30.0–36.0)
MCV: 74.4 fL — ABNORMAL LOW (ref 80.0–100.0)
Monocytes Absolute: 0.3 10*3/uL (ref 0.1–1.0)
Monocytes Relative: 8 %
Neutro Abs: 2.3 10*3/uL (ref 1.7–7.7)
Neutrophils Relative %: 58 %
Platelet Count: 131 10*3/uL — ABNORMAL LOW (ref 150–400)
RBC: 2.5 MIL/uL — ABNORMAL LOW (ref 3.87–5.11)
RDW: 19.9 % — ABNORMAL HIGH (ref 11.5–15.5)
WBC Count: 4 10*3/uL (ref 4.0–10.5)
nRBC: 0 % (ref 0.0–0.2)

## 2021-07-16 LAB — CMP (CANCER CENTER ONLY)
ALT: 8 U/L (ref 0–44)
AST: 16 U/L (ref 15–41)
Albumin: 3.8 g/dL (ref 3.5–5.0)
Alkaline Phosphatase: 105 U/L (ref 38–126)
Anion gap: 9 (ref 5–15)
BUN: 24 mg/dL — ABNORMAL HIGH (ref 8–23)
CO2: 23 mmol/L (ref 22–32)
Calcium: 9.4 mg/dL (ref 8.9–10.3)
Chloride: 108 mmol/L (ref 98–111)
Creatinine: 1.52 mg/dL — ABNORMAL HIGH (ref 0.44–1.00)
GFR, Estimated: 34 mL/min — ABNORMAL LOW (ref >60)
Glucose, Bld: 96 mg/dL (ref 70–99)
Potassium: 3.9 mmol/L (ref 3.5–5.1)
Sodium: 140 mmol/L (ref 135–145)
Total Bilirubin: 0.4 mg/dL (ref 0.3–1.2)
Total Protein: 7.8 g/dL (ref 6.5–8.1)

## 2021-07-16 LAB — PREPARE RBC (CROSSMATCH)

## 2021-07-16 LAB — SAMPLE TO BLOOD BANK

## 2021-07-16 MED ORDER — DARBEPOETIN ALFA 500 MCG/ML IJ SOSY
500.0000 ug | PREFILLED_SYRINGE | Freq: Once | INTRAMUSCULAR | Status: AC
  Administered 2021-07-16: 500 ug via SUBCUTANEOUS
  Filled 2021-07-16: qty 1

## 2021-07-16 NOTE — Progress Notes (Signed)
Hematology and Oncology Follow Up Visit  Susan [name] [id number] 12-Apr-1941 80 y.o. 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     Current therapy:  Aranesp 500 mcg every 4 weeks to keep her hemoglobin above 11.    Interim History:  [name] is here for a follow-up visit.  Since last visit, she reported excessive fatigue and tiredness.  She has denied any hematochezia, melena or hemoptysis.  She does report dyspnea on exertion but no chest pain or shortness of breath at rest.  She did report near syncopal episode.  Her performance status remains marginal at this time.     Medications: Reviewed without changes. Current Outpatient Medications  Medication Sig Dispense Refill   acetaminophen (TYLENOL) 500 MG tablet Take 500 mg by mouth every 6 (six) hours as needed for moderate pain (for pain.).     allopurinol (ZYLOPRIM) 300 MG tablet Take 300 mg by mouth daily.     amLODipine (NORVASC) 10 MG tablet TAKE 1/2 TABLET BY MOUTH DAILY AT BEDTIME; (TAKE 1 TABLET IF SBP IS >150) 90 tablet 0   aspirin 81 MG chewable tablet Chew 81 mg by mouth daily.     benzonatate (TESSALON) 100 MG capsule Take 1 capsule (100 mg total) by mouth every 8 (eight) hours. 21 capsule 0   donepezil (ARICEPT) 10 MG tablet Take 10 mg by mouth daily.     famotidine (PEPCID) 20 MG tablet Take 20 mg by mouth 2 (two) times daily.     labetalol (NORMODYNE) 200 MG tablet Take 200 mg by mouth 2 (two) times daily.     levothyroxine (SYNTHROID) 75 MCG tablet Take 75 mcg by mouth every morning.     memantine (NAMENDA) 10 MG tablet Take 1 tablet (10 mg total) by mouth 2 (two) times daily. 60 tablet 11   mirtazapine (REMERON) 15 MG tablet Take 15 mg by mouth at bedtime.     montelukast (SINGULAIR) 10 MG tablet Take 10 mg by mouth at bedtime.     pantoprazole (PROTONIX) 40 MG tablet Take 1 tablet (40 mg  total) by mouth daily. 30 tablet 3   traMADol (ULTRAM) 50 MG tablet Take 50 mg by mouth every 6 (six) hours as needed for moderate pain.     No current facility-administered medications for this visit.       Physical Exam:     Blood pressure 102/68, pulse 74, temperature 97.8 F (36.6 C), temperature source Oral, resp. rate 16, height 5' 1.5" (1.562 m), weight 133 lb 8 oz (60.6 kg), SpO2 100 %.    ECOG: 1    General appearance: Comfortable appearing without any discomfort Head: Normocephalic without any trauma Oropharynx: Mucous membranes are moist and pink without any thrush or ulcers. Eyes: Pupils are equal and round reactive to light. Lymph nodes: No cervical, supraclavicular, inguinal or axillary lymphadenopathy.   Heart:regular rate and rhythm.  S1 and S2 without leg edema. Lung: Clear without any rhonchi or wheezes.  No dullness to percussion. Abdomin: Soft, nontender, nondistended with good bowel sounds.  No hepatosplenomegaly. Musculoskeletal: No joint deformity or effusion.  Full range of motion noted. Neurological: No deficits noted on motor, sensory and deep tendon reflex exam. Skin: No petechial rash or dryness.  Appeared moist.          Lab Results: Lab Results  Component Value Date  WBC 3.7 (L) 06/11/2021   HGB 7.6 (L) 06/11/2021   HCT 23.3 (L) 06/11/2021   MCV 77.9 (L) 06/11/2021   PLT 95 (L) 06/11/2021     Chemistry      Component Value Date/Time   NA 143 06/11/2021 1045   NA 143 06/30/2017 0846   K 4.3 06/11/2021 1045   K 4.4 06/30/2017 0846   CL 109 06/11/2021 1045   CO2 24 06/11/2021 1045   CO2 24 06/30/2017 0846   BUN 25 (H) 06/11/2021 1045   BUN 26.5 (H) 06/30/2017 0846   CREATININE 1.80 (H) 06/11/2021 1045   CREATININE 1.31 (H) 07/02/2018 0950   CREATININE 1.4 (H) 06/30/2017 0846      Component Value Date/Time   CALCIUM 10.2 06/11/2021 1045   CALCIUM 10.1 06/30/2017 0846   ALKPHOS 128 (H) 06/11/2021 1045   ALKPHOS 101  06/30/2017 0846   AST 19 06/11/2021 1045   AST 17 06/30/2017 0846   ALT 9 06/11/2021 1045   ALT <6 06/30/2017 0846   BILITOT 0.4 06/11/2021 1045   BILITOT 0.76 06/30/2017 0846      Impression and Plan:   80 year old woman with:   1.  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  She has element of myelodysplasia and possible iron deficiency as well.  He is currently on Aranesp without any major complications.  Risks and benefits of continuing this treatment were reviewed at this time.  Complications including venous thromboembolism and hypertension were reviewed.  She will receive injection today and will change the frequency to every 2 weeks given her recent drop in hemoglobin.  We will also update iron studies and replace as needed.  Her hemoglobin today is down again without any signs or symptoms of bleeding.  It is unclear the nature of her worsening anemia.  She could have a worsening myelodysplastic syndrome that is affecting her recovery of red cell production.  Repeating a bone marrow biopsy would be reasonable although it is unclear I will alter our management.  She is a marginal candidate for any additional therapy.  I recommended continued supportive care at this time.      2.  Thrombocytopenia: Close to normal range at this time.  Is not contributing to bleeding at this time.  3.  Hematochezia: None reported and will continue to monitor.  4.  Follow-up: We will be in the next 24 hours to receive packed red cell transfusion every 2 weeks for laboratory monitoring.  30  minutes were dedicated to this encounter.  The time was spent reviewing laboratory data, disease status update, treatment choices and future plan of care review.         Zola Button MD 10/14/202210:46 AM

## 2021-07-16 NOTE — Progress Notes (Signed)
CRITICAL VALUE STICKER  CRITICAL VALUE: Hgb 5.8  RECEIVER (on-site recipient of call): Velna Ochs RN  DATE & TIME NOTIFIED: 07/16/21 @ 1048  MESSENGER (representative from lab): Lelan Pons  MD NOTIFIED: Dr. Alen Blew  TIME OF NOTIFICATION: 5051  RESPONSE:  Order received for 2 units PRBC's

## 2021-07-16 NOTE — Progress Notes (Signed)
Patient HGB level 5.8 MD directed to provide patient with injection.

## 2021-07-17 ENCOUNTER — Inpatient Hospital Stay: Payer: Medicare Other

## 2021-07-17 DIAGNOSIS — D631 Anemia in chronic kidney disease: Secondary | ICD-10-CM

## 2021-07-17 DIAGNOSIS — N189 Chronic kidney disease, unspecified: Secondary | ICD-10-CM

## 2021-07-17 DIAGNOSIS — N1831 Chronic kidney disease, stage 3a: Secondary | ICD-10-CM | POA: Diagnosis not present

## 2021-07-17 MED ORDER — DIPHENHYDRAMINE HCL 25 MG PO CAPS
25.0000 mg | ORAL_CAPSULE | Freq: Once | ORAL | Status: AC
  Administered 2021-07-17: 25 mg via ORAL

## 2021-07-17 MED ORDER — ACETAMINOPHEN 325 MG PO TABS
650.0000 mg | ORAL_TABLET | Freq: Once | ORAL | Status: AC
Start: 2021-07-17 — End: 2021-07-17
  Administered 2021-07-17: 650 mg via ORAL

## 2021-07-17 MED ORDER — SODIUM CHLORIDE 0.9% IV SOLUTION
250.0000 mL | Freq: Once | INTRAVENOUS | Status: AC
  Administered 2021-07-17: 250 mL via INTRAVENOUS

## 2021-07-17 MED ORDER — DIPHENHYDRAMINE HCL 25 MG PO CAPS
ORAL_CAPSULE | ORAL | Status: AC
  Filled 2021-07-17: qty 1

## 2021-07-17 NOTE — Patient Instructions (Signed)
Blood Transfusion, Adult, Care After This sheet gives you information about how to care for yourself after your procedure. Your doctor may also give you more specific instructions. If you have problems or questions, contact your doctor. What can I expect after the procedure? After the procedure, it is common to have: Bruising and soreness at the IV site. A headache. Follow these instructions at home: Insertion site care   Follow instructions from your doctor about how to take care of your insertion site. This is where an IV tube was put into your vein. Make sure you: Wash your hands with soap and water before and after you change your bandage (dressing). If you cannot use soap and water, use hand sanitizer. Change your bandage as told by your doctor. Check your insertion site every day for signs of infection. Check for: Redness, swelling, or pain. Bleeding from the site. Warmth. Pus or a bad smell. General instructions Take over-the-counter and prescription medicines only as told by your doctor. Rest as told by your doctor. Go back to your normal activities as told by your doctor. Keep all follow-up visits as told by your doctor. This is important. Contact a doctor if: You have itching or red, swollen areas of skin (hives). You feel worried or nervous (anxious). You feel weak after doing your normal activities. You have redness, swelling, warmth, or pain around the insertion site. You have blood coming from the insertion site, and the blood does not stop with pressure. You have pus or a bad smell coming from the insertion site. Get help right away if: You have signs of a serious reaction. This may be coming from an allergy or the body's defense system (immune system). Signs include: Trouble breathing or shortness of breath. Swelling of the face or feeling warm (flushed). Fever or chills. Head, chest, or back pain. Dark pee (urine) or blood in the pee. Widespread rash. Fast  heartbeat. Feeling dizzy or light-headed. You may receive your blood transfusion in an outpatient setting. If so, you will be told whom to contact to report any reactions. These symptoms may be an emergency. Do not wait to see if the symptoms will go away. Get medical help right away. Call your local emergency services (911 in the [place] Do not drive yourself to the hospital. Summary Bruising and soreness at the IV site are common. Check your insertion site every day for signs of infection. Rest as told by your doctor. Go back to your normal activities as told by your doctor. Get help right away if you have signs of a serious reaction. This information is not intended to replace advice given to you by your health care provider. Make sure you discuss any questions you have with your health care provider. Document Revised: 01/14/2021 Document Reviewed: 03/14/2019 Elsevier Patient Education  2022 [employer].  

## 2021-07-19 LAB — TYPE AND SCREEN
ABO/RH(D): O POS
Antibody Screen: POSITIVE
Donor AG Type: NEGATIVE
Donor AG Type: NEGATIVE
Unit division: 0
Unit division: 0

## 2021-07-19 LAB — BPAM RBC
Blood Product Expiration Date: 202211132359
Blood Product Expiration Date: 202211132359
ISSUE DATE / TIME: 202210150907
ISSUE DATE / TIME: 202210150907
Unit Type and Rh: 5100
Unit Type and Rh: 5100

## 2021-07-20 ENCOUNTER — Other Ambulatory Visit: Payer: Self-pay | Admitting: Student

## 2021-07-20 DIAGNOSIS — Z9181 History of falling: Secondary | ICD-10-CM

## 2021-07-30 ENCOUNTER — Inpatient Hospital Stay: Payer: Medicare Other

## 2021-07-30 ENCOUNTER — Other Ambulatory Visit: Payer: Self-pay

## 2021-07-30 VITALS — BP 136/73 | HR 67 | Temp 98.4°F | Resp 18

## 2021-07-30 DIAGNOSIS — N1831 Chronic kidney disease, stage 3a: Secondary | ICD-10-CM

## 2021-07-30 DIAGNOSIS — D631 Anemia in chronic kidney disease: Secondary | ICD-10-CM

## 2021-07-30 DIAGNOSIS — N189 Chronic kidney disease, unspecified: Secondary | ICD-10-CM

## 2021-07-30 LAB — CMP (CANCER CENTER ONLY)
ALT: 6 U/L (ref 0–44)
AST: 15 U/L (ref 15–41)
Albumin: 3.9 g/dL (ref 3.5–5.0)
Alkaline Phosphatase: 124 U/L (ref 38–126)
Anion gap: 9 (ref 5–15)
BUN: 23 mg/dL (ref 8–23)
CO2: 23 mmol/L (ref 22–32)
Calcium: 9.9 mg/dL (ref 8.9–10.3)
Chloride: 109 mmol/L (ref 98–111)
Creatinine: 1.84 mg/dL — ABNORMAL HIGH (ref 0.44–1.00)
GFR, Estimated: 27 mL/min — ABNORMAL LOW (ref >60)
Glucose, Bld: 118 mg/dL — ABNORMAL HIGH (ref 70–99)
Potassium: 4 mmol/L (ref 3.5–5.1)
Sodium: 141 mmol/L (ref 135–145)
Total Bilirubin: 0.5 mg/dL (ref 0.3–1.2)
Total Protein: 8.2 g/dL — ABNORMAL HIGH (ref 6.5–8.1)

## 2021-07-30 LAB — FERRITIN: Ferritin: 22 ng/mL (ref 11–307)

## 2021-07-30 LAB — CBC WITH DIFFERENTIAL (CANCER CENTER ONLY)
Abs Immature Granulocytes: 0.01 10*3/uL (ref 0.00–0.07)
Basophils Absolute: 0 10*3/uL (ref 0.0–0.1)
Basophils Relative: 0 %
Eosinophils Absolute: 0 10*3/uL (ref 0.0–0.5)
Eosinophils Relative: 1 %
HCT: 25.8 % — ABNORMAL LOW (ref 36.0–46.0)
Hemoglobin: 8.4 g/dL — ABNORMAL LOW (ref 12.0–15.0)
Immature Granulocytes: 0 %
Lymphocytes Relative: 33 %
Lymphs Abs: 1.5 10*3/uL (ref 0.7–4.0)
MCH: 24.3 pg — ABNORMAL LOW (ref 26.0–34.0)
MCHC: 32.6 g/dL (ref 30.0–36.0)
MCV: 74.8 fL — ABNORMAL LOW (ref 80.0–100.0)
Monocytes Absolute: 0.3 10*3/uL (ref 0.1–1.0)
Monocytes Relative: 7 %
Neutro Abs: 2.7 10*3/uL (ref 1.7–7.7)
Neutrophils Relative %: 59 %
Platelet Count: 146 10*3/uL — ABNORMAL LOW (ref 150–400)
RBC: 3.45 MIL/uL — ABNORMAL LOW (ref 3.87–5.11)
RDW: 21.3 % — ABNORMAL HIGH (ref 11.5–15.5)
WBC Count: 4.6 10*3/uL (ref 4.0–10.5)
nRBC: 0 % (ref 0.0–0.2)

## 2021-07-30 LAB — IRON AND TIBC
Iron: 26 ug/dL — ABNORMAL LOW (ref 41–142)
Saturation Ratios: 10 % — ABNORMAL LOW (ref 21–57)
TIBC: 265 ug/dL (ref 236–444)
UIBC: 239 ug/dL (ref 120–384)

## 2021-07-30 LAB — SAMPLE TO BLOOD BANK

## 2021-07-30 MED ORDER — DARBEPOETIN ALFA 500 MCG/ML IJ SOSY
500.0000 ug | PREFILLED_SYRINGE | Freq: Once | INTRAMUSCULAR | Status: AC
  Administered 2021-07-30: 500 ug via SUBCUTANEOUS
  Filled 2021-07-30: qty 1

## 2021-07-30 NOTE — Patient Instructions (Signed)
Darbepoetin Alfa injection What is this medication? DARBEPOETIN ALFA (dar be POE e tin AL fa) helps your body make more red blood cells. It is used to treat anemia caused by chronic kidney failure and chemotherapy. This medicine may be used for other purposes; ask your health care provider or pharmacist if you have questions. COMMON BRAND NAME(S): Aranesp What should I tell my care team before I take this medication? They need to know if you have any of these conditions: blood clotting disorders or history of blood clots cancer patient not on chemotherapy cystic fibrosis heart disease, such as angina, heart failure, or a history of a heart attack hemoglobin level of 12 g/dL or greater high blood pressure low levels of folate, iron, or vitamin B12 seizures an unusual or allergic reaction to darbepoetin, erythropoietin, albumin, hamster proteins, latex, other medicines, foods, dyes, or preservatives pregnant or trying to get pregnant breast-feeding How should I use this medication? This medicine is for injection into a vein or under the skin. It is usually given by a health care professional in a hospital or clinic setting. If you get this medicine at home, you will be taught how to prepare and give this medicine. Use exactly as directed. Take your medicine at regular intervals. Do not take your medicine more often than directed. It is important that you put your used needles and syringes in a special sharps container. Do not put them in a trash can. If you do not have a sharps container, call your pharmacist or healthcare provider to get one. A special MedGuide will be given to you by the pharmacist with each prescription and refill. Be sure to read this information carefully each time. Talk to your pediatrician regarding the use of this medicine in children. While this medicine may be used in children as young as 1 month of age for selected conditions, precautions do apply. Overdosage: If you  think you have taken too much of this medicine contact a poison control center or emergency room at once. NOTE: This medicine is only for you. Do not share this medicine with others. What if I miss a dose? If you miss a dose, take it as soon as you can. If it is almost time for your next dose, take only that dose. Do not take double or extra doses. What may interact with this medication? Do not take this medicine with any of the following medications: epoetin alfa This list may not describe all possible interactions. Give your health care provider a list of all the medicines, herbs, non-prescription drugs, or dietary supplements you use. Also tell them if you smoke, drink alcohol, or use illegal drugs. Some items may interact with your medicine. What should I watch for while using this medication? Your condition will be monitored carefully while you are receiving this medicine. You may need blood work done while you are taking this medicine. This medicine may cause a decrease in vitamin B6. You should make sure that you get enough vitamin B6 while you are taking this medicine. Discuss the foods you eat and the vitamins you take with your health care professional. What side effects may I notice from receiving this medication? Side effects that you should report to your doctor or health care professional as soon as possible: allergic reactions like skin rash, itching or hives, swelling of the face, lips, or tongue breathing problems changes in vision chest pain confusion, trouble speaking or understanding feeling faint or lightheaded, falls high blood pressure   muscle aches or pains pain, swelling, warmth in the leg rapid weight gain severe headaches sudden numbness or weakness of the face, arm or leg trouble walking, dizziness, loss of balance or coordination seizures (convulsions) swelling of the ankles, feet, hands unusually weak or tired Side effects that usually do not require medical  attention (report to your doctor or health care professional if they continue or are bothersome): diarrhea fever, chills (flu-like symptoms) headaches nausea, vomiting redness, stinging, or swelling at site where injected This list may not describe all possible side effects. Call your doctor for medical advice about side effects. You may report side effects to FDA at 1-800-FDA-1088. Where should I keep my medication? Keep out of the reach of children. Store in a refrigerator between 2 and 8 degrees C (36 and 46 degrees F). Do not freeze. Do not shake. Throw away any unused portion if using a single-dose vial. Throw away any unused medicine after the expiration date. NOTE: This sheet is a summary. It may not cover all possible information. If you have questions about this medicine, talk to your doctor, pharmacist, or health care provider.  2022 Elsevier/Gold Standard (2017-10-04 16:44:20)  

## 2021-08-05 ENCOUNTER — Other Ambulatory Visit: Payer: Self-pay | Admitting: Student

## 2021-08-05 DIAGNOSIS — Z9181 History of falling: Secondary | ICD-10-CM

## 2021-08-06 ENCOUNTER — Ambulatory Visit
Admission: RE | Admit: 2021-08-06 | Discharge: 2021-08-06 | Disposition: A | Discharge status: Home / Self Care | Payer: Medicare Other | Source: Ambulatory Visit | Attending: Student | Admitting: Student

## 2021-08-06 ENCOUNTER — Other Ambulatory Visit: Payer: Self-pay

## 2021-08-06 DIAGNOSIS — Z9181 History of falling: Secondary | ICD-10-CM

## 2021-08-13 ENCOUNTER — Inpatient Hospital Stay: Payer: Medicare Other

## 2021-08-13 ENCOUNTER — Telehealth: Payer: Self-pay | Admitting: *Deleted

## 2021-08-13 ENCOUNTER — Inpatient Hospital Stay: Payer: Medicare Other | Attending: Oncology

## 2021-08-13 ENCOUNTER — Other Ambulatory Visit: Payer: Self-pay

## 2021-08-13 DIAGNOSIS — Z79899 Other long term (current) drug therapy: Secondary | ICD-10-CM | POA: Diagnosis not present

## 2021-08-13 DIAGNOSIS — N189 Chronic kidney disease, unspecified: Secondary | ICD-10-CM

## 2021-08-13 DIAGNOSIS — D631 Anemia in chronic kidney disease: Secondary | ICD-10-CM | POA: Insufficient documentation

## 2021-08-13 DIAGNOSIS — K921 Melena: Secondary | ICD-10-CM | POA: Insufficient documentation

## 2021-08-13 DIAGNOSIS — D696 Thrombocytopenia, unspecified: Secondary | ICD-10-CM | POA: Diagnosis not present

## 2021-08-13 DIAGNOSIS — N1831 Chronic kidney disease, stage 3a: Secondary | ICD-10-CM | POA: Diagnosis not present

## 2021-08-13 LAB — CBC WITH DIFFERENTIAL (CANCER CENTER ONLY)
Abs Immature Granulocytes: 0.01 10*3/uL (ref 0.00–0.07)
Basophils Absolute: 0 10*3/uL (ref 0.0–0.1)
Basophils Relative: 0 %
Eosinophils Absolute: 0 10*3/uL (ref 0.0–0.5)
Eosinophils Relative: 1 %
HCT: 25.8 % — ABNORMAL LOW (ref 36.0–46.0)
Hemoglobin: 8 g/dL — ABNORMAL LOW (ref 12.0–15.0)
Immature Granulocytes: 0 %
Lymphocytes Relative: 34 %
Lymphs Abs: 1.4 10*3/uL (ref 0.7–4.0)
MCH: 22.9 pg — ABNORMAL LOW (ref 26.0–34.0)
MCHC: 31 g/dL (ref 30.0–36.0)
MCV: 73.9 fL — ABNORMAL LOW (ref 80.0–100.0)
Monocytes Absolute: 0.4 10*3/uL (ref 0.1–1.0)
Monocytes Relative: 10 %
Neutro Abs: 2.3 10*3/uL (ref 1.7–7.7)
Neutrophils Relative %: 55 %
Platelet Count: 125 10*3/uL — ABNORMAL LOW (ref 150–400)
RBC: 3.49 MIL/uL — ABNORMAL LOW (ref 3.87–5.11)
RDW: 21.4 % — ABNORMAL HIGH (ref 11.5–15.5)
WBC Count: 4.1 10*3/uL (ref 4.0–10.5)
nRBC: 0 % (ref 0.0–0.2)

## 2021-08-13 LAB — IRON AND TIBC
Iron: 19 ug/dL — ABNORMAL LOW (ref 41–142)
Saturation Ratios: 7 % — ABNORMAL LOW (ref 21–57)
TIBC: 250 ug/dL (ref 236–444)
UIBC: 231 ug/dL (ref 120–384)

## 2021-08-13 LAB — CMP (CANCER CENTER ONLY)
ALT: 7 U/L (ref 0–44)
AST: 14 U/L — ABNORMAL LOW (ref 15–41)
Albumin: 3.8 g/dL (ref 3.5–5.0)
Alkaline Phosphatase: 111 U/L (ref 38–126)
Anion gap: 9 (ref 5–15)
BUN: 23 mg/dL (ref 8–23)
CO2: 23 mmol/L (ref 22–32)
Calcium: 9.2 mg/dL (ref 8.9–10.3)
Chloride: 110 mmol/L (ref 98–111)
Creatinine: 1.41 mg/dL — ABNORMAL HIGH (ref 0.44–1.00)
GFR, Estimated: 37 mL/min — ABNORMAL LOW (ref >60)
Glucose, Bld: 84 mg/dL (ref 70–99)
Potassium: 3.8 mmol/L (ref 3.5–5.1)
Sodium: 142 mmol/L (ref 135–145)
Total Bilirubin: 0.5 mg/dL (ref 0.3–1.2)
Total Protein: 7.9 g/dL (ref 6.5–8.1)

## 2021-08-13 LAB — SAMPLE TO BLOOD BANK

## 2021-08-13 LAB — FERRITIN: Ferritin: 16 ng/mL (ref 11–307)

## 2021-08-13 NOTE — Telephone Encounter (Signed)
Patient's Hgb is 8.1 today, no blood transfusion needed per Dr. Alen Blew.  Patient informed by infusion RN.

## 2021-08-20 ENCOUNTER — Telehealth: Payer: Self-pay | Admitting: *Deleted

## 2021-08-20 NOTE — Telephone Encounter (Signed)
Susan [name] called to see if she has any more appts scheduled this week. Nothing scheduled until 12/1.  States she is feeling weak. Message to scheduler to ger her on schedule for labs and possible transfusion early nest week. Instructed to go to the ED if feeling worse this weekend

## 2021-08-24 ENCOUNTER — Inpatient Hospital Stay: Payer: Medicare Other

## 2021-08-24 ENCOUNTER — Other Ambulatory Visit: Payer: Self-pay

## 2021-08-24 ENCOUNTER — Other Ambulatory Visit: Payer: Self-pay | Admitting: *Deleted

## 2021-08-24 VITALS — BP 128/82 | HR 74 | Temp 98.1°F | Resp 18

## 2021-08-24 DIAGNOSIS — N189 Chronic kidney disease, unspecified: Secondary | ICD-10-CM

## 2021-08-24 DIAGNOSIS — D631 Anemia in chronic kidney disease: Secondary | ICD-10-CM

## 2021-08-24 DIAGNOSIS — N1831 Chronic kidney disease, stage 3a: Secondary | ICD-10-CM | POA: Diagnosis not present

## 2021-08-24 LAB — CBC WITH DIFFERENTIAL (CANCER CENTER ONLY)
Abs Immature Granulocytes: 0.01 10*3/uL (ref 0.00–0.07)
Basophils Absolute: 0 10*3/uL (ref 0.0–0.1)
Basophils Relative: 0 %
Eosinophils Absolute: 0 10*3/uL (ref 0.0–0.5)
Eosinophils Relative: 1 %
HCT: 25.6 % — ABNORMAL LOW (ref 36.0–46.0)
Hemoglobin: 7.9 g/dL — ABNORMAL LOW (ref 12.0–15.0)
Immature Granulocytes: 0 %
Lymphocytes Relative: 36 %
Lymphs Abs: 1.3 10*3/uL (ref 0.7–4.0)
MCH: 22.2 pg — ABNORMAL LOW (ref 26.0–34.0)
MCHC: 30.9 g/dL (ref 30.0–36.0)
MCV: 71.9 fL — ABNORMAL LOW (ref 80.0–100.0)
Monocytes Absolute: 0.3 10*3/uL (ref 0.1–1.0)
Monocytes Relative: 7 %
Neutro Abs: 2 10*3/uL (ref 1.7–7.7)
Neutrophils Relative %: 56 %
Platelet Count: 132 10*3/uL — ABNORMAL LOW (ref 150–400)
RBC: 3.56 MIL/uL — ABNORMAL LOW (ref 3.87–5.11)
RDW: 21.4 % — ABNORMAL HIGH (ref 11.5–15.5)
WBC Count: 3.6 10*3/uL — ABNORMAL LOW (ref 4.0–10.5)
nRBC: 0 % (ref 0.0–0.2)

## 2021-08-24 LAB — CMP (CANCER CENTER ONLY)
ALT: 5 U/L (ref 0–44)
AST: 14 U/L — ABNORMAL LOW (ref 15–41)
Albumin: 3.7 g/dL (ref 3.5–5.0)
Alkaline Phosphatase: 118 U/L (ref 38–126)
Anion gap: 8 (ref 5–15)
BUN: 18 mg/dL (ref 8–23)
CO2: 26 mmol/L (ref 22–32)
Calcium: 9.4 mg/dL (ref 8.9–10.3)
Chloride: 108 mmol/L (ref 98–111)
Creatinine: 1.4 mg/dL — ABNORMAL HIGH (ref 0.44–1.00)
GFR, Estimated: 38 mL/min — ABNORMAL LOW (ref >60)
Glucose, Bld: 86 mg/dL (ref 70–99)
Potassium: 3.9 mmol/L (ref 3.5–5.1)
Sodium: 142 mmol/L (ref 135–145)
Total Bilirubin: 0.5 mg/dL (ref 0.3–1.2)
Total Protein: 8 g/dL (ref 6.5–8.1)

## 2021-08-24 LAB — SAMPLE TO BLOOD BANK

## 2021-08-24 MED ORDER — DARBEPOETIN ALFA 500 MCG/ML IJ SOSY
500.0000 ug | PREFILLED_SYRINGE | Freq: Once | INTRAMUSCULAR | Status: AC
  Administered 2021-08-24: 500 ug via SUBCUTANEOUS
  Filled 2021-08-24: qty 1

## 2021-08-24 NOTE — Progress Notes (Signed)
Patient arrived for labs today to determine the need for Blood or Platelets.  Lab results reviewed with Dr Julien Nordmann in [facility].  He advised no blood or platelets for hgb 7.9  Patient is due for Aranesp.  Injection given.  Patient will get reevaluated next week with Labs and possible infusion.

## 2021-09-02 ENCOUNTER — Telehealth: Payer: Self-pay | Admitting: *Deleted

## 2021-09-02 ENCOUNTER — Inpatient Hospital Stay: Payer: Medicare Other

## 2021-09-02 ENCOUNTER — Inpatient Hospital Stay: Payer: Medicare Other | Attending: Oncology

## 2021-09-02 DIAGNOSIS — N1831 Chronic kidney disease, stage 3a: Secondary | ICD-10-CM | POA: Insufficient documentation

## 2021-09-02 DIAGNOSIS — D631 Anemia in chronic kidney disease: Secondary | ICD-10-CM | POA: Insufficient documentation

## 2021-09-02 NOTE — Telephone Encounter (Signed)
Attempted to call patient twice regarding missed appts. No answer/no voice mail

## 2021-09-03 ENCOUNTER — Inpatient Hospital Stay: Payer: Medicare Other

## 2021-09-03 ENCOUNTER — Other Ambulatory Visit: Payer: Self-pay

## 2021-09-03 ENCOUNTER — Other Ambulatory Visit: Payer: Self-pay | Admitting: *Deleted

## 2021-09-03 ENCOUNTER — Other Ambulatory Visit: Payer: Self-pay | Admitting: Oncology

## 2021-09-03 ENCOUNTER — Ambulatory Visit: Payer: Medicare Other | Admitting: Podiatry

## 2021-09-03 DIAGNOSIS — D631 Anemia in chronic kidney disease: Secondary | ICD-10-CM

## 2021-09-03 DIAGNOSIS — N1831 Chronic kidney disease, stage 3a: Secondary | ICD-10-CM | POA: Diagnosis present

## 2021-09-03 DIAGNOSIS — D509 Iron deficiency anemia, unspecified: Secondary | ICD-10-CM

## 2021-09-03 DIAGNOSIS — N189 Chronic kidney disease, unspecified: Secondary | ICD-10-CM

## 2021-09-03 LAB — CBC WITH DIFFERENTIAL (CANCER CENTER ONLY)
Abs Immature Granulocytes: 0.01 10*3/uL (ref 0.00–0.07)
Basophils Absolute: 0 10*3/uL (ref 0.0–0.1)
Basophils Relative: 0 %
Eosinophils Absolute: 0 10*3/uL (ref 0.0–0.5)
Eosinophils Relative: 1 %
HCT: 25.2 % — ABNORMAL LOW (ref 36.0–46.0)
Hemoglobin: 8.2 g/dL — ABNORMAL LOW (ref 12.0–15.0)
Immature Granulocytes: 0 %
Lymphocytes Relative: 30 %
Lymphs Abs: 1.2 10*3/uL (ref 0.7–4.0)
MCH: 22.9 pg — ABNORMAL LOW (ref 26.0–34.0)
MCHC: 32.5 g/dL (ref 30.0–36.0)
MCV: 70.4 fL — ABNORMAL LOW (ref 80.0–100.0)
Monocytes Absolute: 0.3 10*3/uL (ref 0.1–1.0)
Monocytes Relative: 8 %
Neutro Abs: 2.4 10*3/uL (ref 1.7–7.7)
Neutrophils Relative %: 61 %
Platelet Count: 193 10*3/uL (ref 150–400)
RBC: 3.58 MIL/uL — ABNORMAL LOW (ref 3.87–5.11)
RDW: 22 % — ABNORMAL HIGH (ref 11.5–15.5)
WBC Count: 4 10*3/uL (ref 4.0–10.5)
nRBC: 0 % (ref 0.0–0.2)

## 2021-09-03 LAB — CMP (CANCER CENTER ONLY)
ALT: 5 U/L (ref 0–44)
AST: 13 U/L — ABNORMAL LOW (ref 15–41)
Albumin: 3.8 g/dL (ref 3.5–5.0)
Alkaline Phosphatase: 122 U/L (ref 38–126)
Anion gap: 8 (ref 5–15)
BUN: 18 mg/dL (ref 8–23)
CO2: 24 mmol/L (ref 22–32)
Calcium: 9.5 mg/dL (ref 8.9–10.3)
Chloride: 112 mmol/L — ABNORMAL HIGH (ref 98–111)
Creatinine: 1.47 mg/dL — ABNORMAL HIGH (ref 0.44–1.00)
GFR, Estimated: 35 mL/min — ABNORMAL LOW (ref >60)
Glucose, Bld: 90 mg/dL (ref 70–99)
Potassium: 3.3 mmol/L — ABNORMAL LOW (ref 3.5–5.1)
Sodium: 144 mmol/L (ref 135–145)
Total Bilirubin: 0.6 mg/dL (ref 0.3–1.2)
Total Protein: 8.2 g/dL — ABNORMAL HIGH (ref 6.5–8.1)

## 2021-09-03 LAB — SAMPLE TO BLOOD BANK

## 2021-09-03 LAB — PREPARE RBC (CROSSMATCH)

## 2021-09-03 MED ORDER — SODIUM CHLORIDE 0.9% IV SOLUTION
250.0000 mL | Freq: Once | INTRAVENOUS | Status: AC
  Administered 2021-09-03: 250 mL via INTRAVENOUS

## 2021-09-03 MED ORDER — DIPHENHYDRAMINE HCL 25 MG PO CAPS
25.0000 mg | ORAL_CAPSULE | Freq: Once | ORAL | Status: AC
  Administered 2021-09-03: 25 mg via ORAL
  Filled 2021-09-03: qty 1

## 2021-09-03 MED ORDER — ACETAMINOPHEN 325 MG PO TABS
650.0000 mg | ORAL_TABLET | Freq: Once | ORAL | Status: AC
  Administered 2021-09-03: 650 mg via ORAL
  Filled 2021-09-03: qty 2

## 2021-09-03 NOTE — Patient Instructions (Signed)
Blood Transfusion, Adult A blood transfusion is a procedure in which you receive blood or a type of blood cell (blood component) through an IV. You may need a blood transfusion when your blood level is low. This may result from a bleeding disorder, illness, injury, or surgery. The blood may come from a donor. You may also be able to donate blood for yourself (autologous blood donation) before a planned surgery. The blood given in a transfusion is made up of different blood components. You may receive: Red blood cells. These carry oxygen to the cells in the body. Platelets. These help your blood to clot. Plasma. This is the liquid part of your blood. It carries proteins and other substances throughout the body. White blood cells. These help you fight infections. If you have hemophilia or another clotting disorder, you may also receive other types of blood products. Tell a health care provider about: Any blood disorders you have. Any previous reactions you have had during a blood transfusion. Any allergies you have. All medicines you are taking, including vitamins, herbs, eye drops, creams, and over-the-counter medicines. Any surgeries you have had. Any medical conditions you have, including any recent fever or cold symptoms. Whether you are pregnant or may be pregnant. What are the risks? Generally, this is a safe procedure. However, problems may occur. The most common problems include: A mild allergic reaction, such as red, swollen areas of skin (hives) and itching. Fever or chills. This may be the body's response to new blood cells received. This may occur during or up to 4 hours after the transfusion. More serious problems may include: Transfusion-associated circulatory overload (TACO), or too much fluid in the lungs. This may cause breathing problems. A serious allergic reaction, such as difficulty breathing or swelling around the face and lips. Transfusion-related acute lung injury  (TRALI), which causes breathing difficulty and low oxygen in the blood. This can occur within hours of the transfusion or several days later. Iron overload. This can happen after receiving many blood transfusions over a period of time. Infection or virus being transmitted. This is rare because donated blood is carefully tested before it is given. Hemolytic transfusion reaction. This is rare. It happens when your body's defense system (immune system)tries to attack the new blood cells. Symptoms may include fever, chills, nausea, low blood pressure, and low back or chest pain. Transfusion-associated graft-versus-host disease (TAGVHD). This is rare. It happens when donated cells attack your body's healthy tissues. What happens before the procedure? Medicines Ask your health care provider about: Changing or stopping your regular medicines. This is especially important if you are taking diabetes medicines or blood thinners. Taking medicines such as aspirin and ibuprofen. These medicines can thin your blood. Do not take these medicines unless your health care provider tells you to take them. Taking over-the-counter medicines, vitamins, herbs, and supplements. General instructions Follow instructions from your health care provider about eating and drinking restrictions. You will have a blood test to determine your blood type. This is necessary to know what kind of blood your body will accept and to match it to the donor blood. If you are going to have a planned surgery, you may be able to do an autologous blood donation. This may be done in case you need to have a transfusion. You will have your temperature, blood pressure, and pulse monitored before the transfusion. If you have had an allergic reaction to a transfusion in the past, you may be given medicine to help prevent   a reaction. This medicine may be given to you by mouth (orally) or through an IV. Set aside time for the blood transfusion. This  procedure generally takes 1-4 hours to complete. What happens during the procedure?  An IV will be inserted into one of your veins. The bag of donated blood will be attached to your IV. The blood will then enter through your vein. Your temperature, blood pressure, and pulse will be monitored regularly during the transfusion. This monitoring is done to detect early signs of a transfusion reaction. Tell your nurse right away if you have any of these symptoms during the transfusion: Shortness of breath or trouble breathing. Chest or back pain. Fever or chills. Hives or itching. If you have any signs or symptoms of a reaction, your transfusion will be stopped and you may be given medicine. When the transfusion is complete, your IV will be removed. Pressure may be applied to the IV site for a few minutes. A bandage (dressing)will be applied. The procedure may vary among health care providers and hospitals. What happens after the procedure? Your temperature, blood pressure, pulse, breathing rate, and blood oxygen level will be monitored until you leave the hospital or clinic. Your blood may be tested to see how you are responding to the transfusion. You may be warmed with fluids or blankets to maintain a normal body temperature. If you receive your blood transfusion in an outpatient setting, you will be told whom to contact to report any reactions. Where to find more information For more information on blood transfusions, visit the [employer]: redcross.org Summary A blood transfusion is a procedure in which you receive blood or a type of blood cell (blood component) through an IV. The blood you receive may come from a donor or be donated by yourself (autologous blood donation) before a planned surgery. The blood given in a transfusion is made up of different blood components. You may receive red blood cells, platelets, plasma, or white blood cells depending on the condition treated. Your  temperature, blood pressure, and pulse will be monitored before, during, and after the transfusion. After the transfusion, your blood may be tested to see how your body has responded. This information is not intended to replace advice given to you by your health care provider. Make sure you discuss any questions you have with your health care provider. Document Revised: 07/25/2019 Document Reviewed: 03/14/2019 Elsevier Patient Education  2022 Elsevier Inc.  

## 2021-09-06 LAB — BPAM RBC
Blood Product Expiration Date: 202212272359
ISSUE DATE / TIME: 202212021349
Unit Type and Rh: 5100

## 2021-09-06 LAB — TYPE AND SCREEN
ABO/RH(D): O POS
Antibody Screen: POSITIVE
Donor AG Type: NEGATIVE
Unit division: 0

## 2021-09-14 ENCOUNTER — Ambulatory Visit: Payer: Medicare Other | Admitting: Podiatry

## 2021-09-15 ENCOUNTER — Other Ambulatory Visit: Payer: Self-pay | Admitting: Nephrology

## 2021-09-15 DIAGNOSIS — N1832 Chronic kidney disease, stage 3b: Secondary | ICD-10-CM

## 2021-09-17 ENCOUNTER — Telehealth: Payer: Self-pay | Admitting: *Deleted

## 2021-09-17 ENCOUNTER — Inpatient Hospital Stay: Payer: Medicare Other

## 2021-09-17 ENCOUNTER — Other Ambulatory Visit: Payer: Self-pay

## 2021-09-17 DIAGNOSIS — N189 Chronic kidney disease, unspecified: Secondary | ICD-10-CM

## 2021-09-17 DIAGNOSIS — D631 Anemia in chronic kidney disease: Secondary | ICD-10-CM

## 2021-09-17 DIAGNOSIS — N1831 Chronic kidney disease, stage 3a: Secondary | ICD-10-CM | POA: Diagnosis not present

## 2021-09-17 LAB — CMP (CANCER CENTER ONLY)
ALT: <5 U/L (ref 0–44)
AST: 14 U/L — ABNORMAL LOW (ref 15–41)
Albumin: 3.5 g/dL (ref 3.5–5.0)
Alkaline Phosphatase: 110 U/L (ref 38–126)
Anion gap: 10 (ref 5–15)
BUN: 25 mg/dL — ABNORMAL HIGH (ref 8–23)
CO2: 25 mmol/L (ref 22–32)
Calcium: 9.5 mg/dL (ref 8.9–10.3)
Chloride: 109 mmol/L (ref 98–111)
Creatinine: 1.74 mg/dL — ABNORMAL HIGH (ref 0.44–1.00)
GFR, Estimated: 29 mL/min — ABNORMAL LOW (ref >60)
Glucose, Bld: 94 mg/dL (ref 70–99)
Potassium: 4 mmol/L (ref 3.5–5.1)
Sodium: 144 mmol/L (ref 135–145)
Total Bilirubin: 0.5 mg/dL (ref 0.3–1.2)
Total Protein: 8.1 g/dL (ref 6.5–8.1)

## 2021-09-17 LAB — CBC WITH DIFFERENTIAL (CANCER CENTER ONLY)
Abs Immature Granulocytes: 0.01 10*3/uL (ref 0.00–0.07)
Basophils Absolute: 0 10*3/uL (ref 0.0–0.1)
Basophils Relative: 0 %
Eosinophils Absolute: 0 10*3/uL (ref 0.0–0.5)
Eosinophils Relative: 1 %
HCT: 27.2 % — ABNORMAL LOW (ref 36.0–46.0)
Hemoglobin: 8.6 g/dL — ABNORMAL LOW (ref 12.0–15.0)
Immature Granulocytes: 0 %
Lymphocytes Relative: 31 %
Lymphs Abs: 1.3 10*3/uL (ref 0.7–4.0)
MCH: 23.1 pg — ABNORMAL LOW (ref 26.0–34.0)
MCHC: 31.6 g/dL (ref 30.0–36.0)
MCV: 72.9 fL — ABNORMAL LOW (ref 80.0–100.0)
Monocytes Absolute: 0.3 10*3/uL (ref 0.1–1.0)
Monocytes Relative: 7 %
Neutro Abs: 2.7 10*3/uL (ref 1.7–7.7)
Neutrophils Relative %: 61 %
Platelet Count: 131 10*3/uL — ABNORMAL LOW (ref 150–400)
RBC: 3.73 MIL/uL — ABNORMAL LOW (ref 3.87–5.11)
RDW: 23.6 % — ABNORMAL HIGH (ref 11.5–15.5)
WBC Count: 4.4 10*3/uL (ref 4.0–10.5)
nRBC: 0 % (ref 0.0–0.2)

## 2021-09-17 LAB — IRON AND TIBC
Iron: 36 ug/dL — ABNORMAL LOW (ref 41–142)
Saturation Ratios: 15 % — ABNORMAL LOW (ref 21–57)
TIBC: 235 ug/dL — ABNORMAL LOW (ref 236–444)
UIBC: 199 ug/dL (ref 120–384)

## 2021-09-17 LAB — SAMPLE TO BLOOD BANK

## 2021-09-17 LAB — FERRITIN: Ferritin: 38 ng/mL (ref 11–307)

## 2021-09-17 NOTE — Telephone Encounter (Signed)
Called pt b/c she had not shown for appt.  She was at another MD office & states she didn't know about this appt.  She asked if she could come tomorrow.  Instructed her to call us when she gets out of her appt.

## 2021-09-17 NOTE — Telephone Encounter (Signed)
Notified pt that there was no need for blood transfusion today per Dr Alen Blew. She expressed happiness over this.  Reminded of her appts for 12/30 & to call if needed something before then.

## 2021-09-24 ENCOUNTER — Encounter (HOSPITAL_COMMUNITY): Payer: Self-pay

## 2021-09-24 ENCOUNTER — Other Ambulatory Visit: Payer: Self-pay

## 2021-09-24 ENCOUNTER — Emergency Department (HOSPITAL_COMMUNITY)
Admission: EM | Admit: 2021-09-24 | Discharge: 2021-09-24 | Disposition: A | Discharge status: Home / Self Care | Payer: Medicare Other | Source: Home / Self Care

## 2021-09-24 DIAGNOSIS — R531 Weakness: Secondary | ICD-10-CM | POA: Diagnosis not present

## 2021-09-24 DIAGNOSIS — C182 Malignant neoplasm of ascending colon: Secondary | ICD-10-CM | POA: Diagnosis not present

## 2021-09-24 DIAGNOSIS — R630 Anorexia: Secondary | ICD-10-CM | POA: Insufficient documentation

## 2021-09-24 DIAGNOSIS — Z5321 Procedure and treatment not carried out due to patient leaving prior to being seen by health care provider: Secondary | ICD-10-CM | POA: Insufficient documentation

## 2021-09-24 LAB — URINALYSIS, ROUTINE W REFLEX MICROSCOPIC
Bilirubin Urine: NEGATIVE
Glucose, UA: NEGATIVE mg/dL
Ketones, ur: NEGATIVE mg/dL
Leukocytes,Ua: NEGATIVE
Nitrite: NEGATIVE
Protein, ur: 100 mg/dL — AB
Specific Gravity, Urine: 1.025 (ref 1.005–1.030)
pH: 6 (ref 5.0–8.0)

## 2021-09-24 LAB — CBC WITH DIFFERENTIAL/PLATELET
Abs Immature Granulocytes: 0.02 10*3/uL (ref 0.00–0.07)
Basophils Absolute: 0 10*3/uL (ref 0.0–0.1)
Basophils Relative: 0 %
Eosinophils Absolute: 0 10*3/uL (ref 0.0–0.5)
Eosinophils Relative: 0 %
HCT: 29.3 % — ABNORMAL LOW (ref 36.0–46.0)
Hemoglobin: 9.2 g/dL — ABNORMAL LOW (ref 12.0–15.0)
Immature Granulocytes: 0 %
Lymphocytes Relative: 30 %
Lymphs Abs: 2 10*3/uL (ref 0.7–4.0)
MCH: 23.1 pg — ABNORMAL LOW (ref 26.0–34.0)
MCHC: 31.4 g/dL (ref 30.0–36.0)
MCV: 73.4 fL — ABNORMAL LOW (ref 80.0–100.0)
Monocytes Absolute: 0.4 10*3/uL (ref 0.1–1.0)
Monocytes Relative: 6 %
Neutro Abs: 4.2 10*3/uL (ref 1.7–7.7)
Neutrophils Relative %: 64 %
Platelets: 179 10*3/uL (ref 150–400)
RBC: 3.99 MIL/uL (ref 3.87–5.11)
RDW: 23.4 % — ABNORMAL HIGH (ref 11.5–15.5)
WBC: 6.6 10*3/uL (ref 4.0–10.5)
nRBC: 0 % (ref 0.0–0.2)

## 2021-09-24 LAB — COMPREHENSIVE METABOLIC PANEL
ALT: 10 U/L (ref 0–44)
AST: 17 U/L (ref 15–41)
Albumin: 4.4 g/dL (ref 3.5–5.0)
Alkaline Phosphatase: 104 U/L (ref 38–126)
Anion gap: 10 (ref 5–15)
BUN: 39 mg/dL — ABNORMAL HIGH (ref 8–23)
CO2: 23 mmol/L (ref 22–32)
Calcium: 10.4 mg/dL — ABNORMAL HIGH (ref 8.9–10.3)
Chloride: 111 mmol/L (ref 98–111)
Creatinine, Ser: 1.66 mg/dL — ABNORMAL HIGH (ref 0.44–1.00)
GFR, Estimated: 31 mL/min — ABNORMAL LOW (ref >60)
Glucose, Bld: 114 mg/dL — ABNORMAL HIGH (ref 70–99)
Potassium: 3.6 mmol/L (ref 3.5–5.1)
Sodium: 144 mmol/L (ref 135–145)
Total Bilirubin: 0.8 mg/dL (ref 0.3–1.2)
Total Protein: 9.2 g/dL — ABNORMAL HIGH (ref 6.5–8.1)

## 2021-09-24 NOTE — ED Provider Notes (Signed)
Emergency Medicine Provider Triage Evaluation Note  Susan [name] , a 80 y.o. female  was evaluated in triage.  Pt brought to the emergency department by her son for concerns of not taking medications, decreased appetite, and generalized weakness.  Son reports that patient has not been taking medications for multiple days.  Has also had decreased p.o. intake.  He has notices mother become weaker recently.  Patient denies any pain or discomfort.  Is annoyed that her son is trying to tell her what to do.  Review of Systems  Positive: Decreased p.o. intake, generalized weakness Negative:   Physical Exam  BP (!) 152/94    Pulse 93    Temp 97.9 F (36.6 C) (Oral)    Resp 18    Ht 5\' 2"  (1.575 m)    Wt 63.5 kg    SpO2 100%    BMI 25.61 kg/m  Gen:   Awake, no distress   Resp:  Normal effort  MSK:   Moves extremities without difficulty  Other:  Patient is alert to person, place, and time  Medical Decision Making  Medically screening exam initiated at 8:32 PM.  Appropriate orders placed.  Susan [name] was informed that the remainder of the evaluation will be completed by another provider, this initial triage assessment does not replace that evaluation, and the importance of remaining in the ED until their evaluation is complete.  Due to reports of decreased intake and generalized weakness will obtain basic lab work and urinalysis.   [name] 09/24/21 2033    Daleen Bo, MD 09/27/21 [phone]

## 2021-09-24 NOTE — ED Triage Notes (Signed)
Pt's son brought her in today because she is not taking her medication as prescribed. The son lives in [place], pt lives here alone and does not want to move with her son.

## 2021-09-24 NOTE — ED Notes (Signed)
Pt and son told registration that they were leaving.

## 2021-09-26 ENCOUNTER — Other Ambulatory Visit: Payer: Self-pay

## 2021-09-26 ENCOUNTER — Encounter (HOSPITAL_COMMUNITY): Payer: Self-pay

## 2021-09-26 ENCOUNTER — Inpatient Hospital Stay (HOSPITAL_COMMUNITY)
Admission: EM | Admit: 2021-09-26 | Discharge: 2021-10-08 | DRG: 330 | Disposition: A | Discharge status: Skilled Nursing Facility | Payer: Medicare Other | Attending: Internal Medicine | Admitting: Internal Medicine

## 2021-09-26 DIAGNOSIS — Z8719 Personal history of other diseases of the digestive system: Secondary | ICD-10-CM

## 2021-09-26 DIAGNOSIS — Z6822 Body mass index (BMI) 22.0-22.9, adult: Secondary | ICD-10-CM

## 2021-09-26 DIAGNOSIS — I10 Essential (primary) hypertension: Secondary | ICD-10-CM | POA: Diagnosis present

## 2021-09-26 DIAGNOSIS — K2970 Gastritis, unspecified, without bleeding: Secondary | ICD-10-CM | POA: Diagnosis present

## 2021-09-26 DIAGNOSIS — I129 Hypertensive chronic kidney disease with stage 1 through stage 4 chronic kidney disease, or unspecified chronic kidney disease: Secondary | ICD-10-CM | POA: Diagnosis present

## 2021-09-26 DIAGNOSIS — Z751 Person awaiting admission to adequate facility elsewhere: Secondary | ICD-10-CM

## 2021-09-26 DIAGNOSIS — D6959 Other secondary thrombocytopenia: Secondary | ICD-10-CM | POA: Diagnosis present

## 2021-09-26 DIAGNOSIS — D62 Acute posthemorrhagic anemia: Secondary | ICD-10-CM | POA: Diagnosis not present

## 2021-09-26 DIAGNOSIS — R627 Adult failure to thrive: Secondary | ICD-10-CM | POA: Diagnosis present

## 2021-09-26 DIAGNOSIS — T381X6A Underdosing of thyroid hormones and substitutes, initial encounter: Secondary | ICD-10-CM | POA: Diagnosis present

## 2021-09-26 DIAGNOSIS — K648 Other hemorrhoids: Secondary | ICD-10-CM | POA: Diagnosis present

## 2021-09-26 DIAGNOSIS — E785 Hyperlipidemia, unspecified: Secondary | ICD-10-CM | POA: Diagnosis present

## 2021-09-26 DIAGNOSIS — K4090 Unilateral inguinal hernia, without obstruction or gangrene, not specified as recurrent: Secondary | ICD-10-CM

## 2021-09-26 DIAGNOSIS — Z79899 Other long term (current) drug therapy: Secondary | ICD-10-CM

## 2021-09-26 DIAGNOSIS — Z7952 Long term (current) use of systemic steroids: Secondary | ICD-10-CM

## 2021-09-26 DIAGNOSIS — N179 Acute kidney failure, unspecified: Secondary | ICD-10-CM | POA: Diagnosis present

## 2021-09-26 DIAGNOSIS — C182 Malignant neoplasm of ascending colon: Principal | ICD-10-CM

## 2021-09-26 DIAGNOSIS — K921 Melena: Secondary | ICD-10-CM | POA: Diagnosis not present

## 2021-09-26 DIAGNOSIS — Z880 Allergy status to penicillin: Secondary | ICD-10-CM

## 2021-09-26 DIAGNOSIS — E870 Hyperosmolality and hypernatremia: Secondary | ICD-10-CM | POA: Diagnosis not present

## 2021-09-26 DIAGNOSIS — E039 Hypothyroidism, unspecified: Secondary | ICD-10-CM | POA: Diagnosis present

## 2021-09-26 DIAGNOSIS — Z8774 Personal history of (corrected) congenital malformations of heart and circulatory system: Secondary | ICD-10-CM | POA: Diagnosis not present

## 2021-09-26 DIAGNOSIS — Z823 Family history of stroke: Secondary | ICD-10-CM

## 2021-09-26 DIAGNOSIS — Z79890 Hormone replacement therapy (postmenopausal): Secondary | ICD-10-CM

## 2021-09-26 DIAGNOSIS — D72828 Other elevated white blood cell count: Secondary | ICD-10-CM | POA: Diagnosis not present

## 2021-09-26 DIAGNOSIS — N183 Chronic kidney disease, stage 3 unspecified: Secondary | ICD-10-CM | POA: Diagnosis present

## 2021-09-26 DIAGNOSIS — N1832 Chronic kidney disease, stage 3b: Secondary | ICD-10-CM | POA: Diagnosis present

## 2021-09-26 DIAGNOSIS — Z882 Allergy status to sulfonamides status: Secondary | ICD-10-CM

## 2021-09-26 DIAGNOSIS — M109 Gout, unspecified: Secondary | ICD-10-CM | POA: Diagnosis present

## 2021-09-26 DIAGNOSIS — D631 Anemia in chronic kidney disease: Secondary | ICD-10-CM | POA: Diagnosis present

## 2021-09-26 DIAGNOSIS — Z91138 Patient's unintentional underdosing of medication regimen for other reason: Secondary | ICD-10-CM

## 2021-09-26 DIAGNOSIS — D469 Myelodysplastic syndrome, unspecified: Secondary | ICD-10-CM | POA: Diagnosis present

## 2021-09-26 DIAGNOSIS — F0390 Unspecified dementia without behavioral disturbance: Secondary | ICD-10-CM | POA: Diagnosis present

## 2021-09-26 DIAGNOSIS — Z20822 Contact with and (suspected) exposure to covid-19: Secondary | ICD-10-CM | POA: Diagnosis present

## 2021-09-26 DIAGNOSIS — M1990 Unspecified osteoarthritis, unspecified site: Secondary | ICD-10-CM | POA: Diagnosis present

## 2021-09-26 DIAGNOSIS — K219 Gastro-esophageal reflux disease without esophagitis: Secondary | ICD-10-CM | POA: Diagnosis present

## 2021-09-26 DIAGNOSIS — Z8249 Family history of ischemic heart disease and other diseases of the circulatory system: Secondary | ICD-10-CM

## 2021-09-26 DIAGNOSIS — K5669 Other partial intestinal obstruction: Secondary | ICD-10-CM | POA: Diagnosis present

## 2021-09-26 DIAGNOSIS — K922 Gastrointestinal hemorrhage, unspecified: Secondary | ICD-10-CM | POA: Diagnosis present

## 2021-09-26 DIAGNOSIS — Z87891 Personal history of nicotine dependence: Secondary | ICD-10-CM

## 2021-09-26 DIAGNOSIS — R8271 Bacteriuria: Secondary | ICD-10-CM | POA: Diagnosis present

## 2021-09-26 DIAGNOSIS — Z881 Allergy status to other antibiotic agents status: Secondary | ICD-10-CM

## 2021-09-26 DIAGNOSIS — Z885 Allergy status to narcotic agent status: Secondary | ICD-10-CM

## 2021-09-26 DIAGNOSIS — D509 Iron deficiency anemia, unspecified: Secondary | ICD-10-CM | POA: Diagnosis present

## 2021-09-26 DIAGNOSIS — Z8052 Family history of malignant neoplasm of bladder: Secondary | ICD-10-CM

## 2021-09-26 DIAGNOSIS — F02A0 Dementia in other diseases classified elsewhere, mild, without behavioral disturbance, psychotic disturbance, mood disturbance, and anxiety: Secondary | ICD-10-CM | POA: Diagnosis present

## 2021-09-26 DIAGNOSIS — K5730 Diverticulosis of large intestine without perforation or abscess without bleeding: Secondary | ICD-10-CM | POA: Diagnosis present

## 2021-09-26 DIAGNOSIS — G301 Alzheimer's disease with late onset: Secondary | ICD-10-CM | POA: Diagnosis present

## 2021-09-26 DIAGNOSIS — K449 Diaphragmatic hernia without obstruction or gangrene: Secondary | ICD-10-CM | POA: Diagnosis present

## 2021-09-26 DIAGNOSIS — E876 Hypokalemia: Secondary | ICD-10-CM | POA: Diagnosis not present

## 2021-09-26 DIAGNOSIS — Z7982 Long term (current) use of aspirin: Secondary | ICD-10-CM

## 2021-09-26 LAB — URINALYSIS, ROUTINE W REFLEX MICROSCOPIC
Bilirubin Urine: NEGATIVE
Glucose, UA: NEGATIVE mg/dL
Ketones, ur: NEGATIVE mg/dL
Nitrite: NEGATIVE
Specific Gravity, Urine: 1.02 (ref 1.005–1.030)
pH: 6 (ref 5.0–8.0)

## 2021-09-26 LAB — CBC WITH DIFFERENTIAL/PLATELET
Abs Immature Granulocytes: 0.02 10*3/uL (ref 0.00–0.07)
Basophils Absolute: 0 10*3/uL (ref 0.0–0.1)
Basophils Relative: 0 %
Eosinophils Absolute: 0 10*3/uL (ref 0.0–0.5)
Eosinophils Relative: 0 %
HCT: 27.1 % — ABNORMAL LOW (ref 36.0–46.0)
Hemoglobin: 8.3 g/dL — ABNORMAL LOW (ref 12.0–15.0)
Immature Granulocytes: 0 %
Lymphocytes Relative: 23 %
Lymphs Abs: 1.4 10*3/uL (ref 0.7–4.0)
MCH: 23 pg — ABNORMAL LOW (ref 26.0–34.0)
MCHC: 30.6 g/dL (ref 30.0–36.0)
MCV: 75.1 fL — ABNORMAL LOW (ref 80.0–100.0)
Monocytes Absolute: 0.5 10*3/uL (ref 0.1–1.0)
Monocytes Relative: 7 %
Neutro Abs: 4.3 10*3/uL (ref 1.7–7.7)
Neutrophils Relative %: 70 %
Platelets: 156 10*3/uL (ref 150–400)
RBC: 3.61 MIL/uL — ABNORMAL LOW (ref 3.87–5.11)
RDW: 23.7 % — ABNORMAL HIGH (ref 11.5–15.5)
WBC: 6.2 10*3/uL (ref 4.0–10.5)
nRBC: 0 % (ref 0.0–0.2)

## 2021-09-26 LAB — COMPREHENSIVE METABOLIC PANEL
ALT: 11 U/L (ref 0–44)
AST: 16 U/L (ref 15–41)
Albumin: 3.8 g/dL (ref 3.5–5.0)
Alkaline Phosphatase: 88 U/L (ref 38–126)
Anion gap: 6 (ref 5–15)
BUN: 39 mg/dL — ABNORMAL HIGH (ref 8–23)
CO2: 26 mmol/L (ref 22–32)
Calcium: 9.8 mg/dL (ref 8.9–10.3)
Chloride: 114 mmol/L — ABNORMAL HIGH (ref 98–111)
Creatinine, Ser: 1.69 mg/dL — ABNORMAL HIGH (ref 0.44–1.00)
GFR, Estimated: 30 mL/min — ABNORMAL LOW (ref >60)
Glucose, Bld: 120 mg/dL — ABNORMAL HIGH (ref 70–99)
Potassium: 3.6 mmol/L (ref 3.5–5.1)
Sodium: 146 mmol/L — ABNORMAL HIGH (ref 135–145)
Total Bilirubin: 0.7 mg/dL (ref 0.3–1.2)
Total Protein: 8 g/dL (ref 6.5–8.1)

## 2021-09-26 LAB — RESP PANEL BY RT-PCR (FLU A&B, COVID) ARPGX2
Influenza A by PCR: NEGATIVE
Influenza B by PCR: NEGATIVE
SARS Coronavirus 2 by RT PCR: NEGATIVE

## 2021-09-26 LAB — POC OCCULT BLOOD, ED: Fecal Occult Bld: POSITIVE — AB

## 2021-09-26 LAB — TSH: TSH: 21.262 u[IU]/mL — ABNORMAL HIGH (ref 0.350–4.500)

## 2021-09-26 MED ORDER — MEMANTINE HCL 10 MG PO TABS
10.0000 mg | ORAL_TABLET | Freq: Two times a day (BID) | ORAL | Status: DC
  Administered 2021-09-27 – 2021-10-08 (×21): 10 mg via ORAL
  Filled 2021-09-26 (×21): qty 1

## 2021-09-26 MED ORDER — ACETAMINOPHEN 325 MG PO TABS
650.0000 mg | ORAL_TABLET | Freq: Four times a day (QID) | ORAL | Status: DC | PRN
  Administered 2021-10-02: 650 mg via ORAL
  Filled 2021-09-26: qty 2

## 2021-09-26 MED ORDER — ONDANSETRON HCL 4 MG/2ML IJ SOLN: 4.0000 mg | Freq: Four times a day (QID) | INTRAMUSCULAR | Status: DC | PRN

## 2021-09-26 MED ORDER — DEXTROSE IN LACTATED RINGERS 5 % IV SOLN: INTRAVENOUS | Status: AC

## 2021-09-26 MED ORDER — DONEPEZIL HCL 10 MG PO TABS
10.0000 mg | ORAL_TABLET | Freq: Every day | ORAL | Status: DC
  Administered 2021-09-27 – 2021-10-08 (×10): 10 mg via ORAL
  Filled 2021-09-26 (×10): qty 1

## 2021-09-26 MED ORDER — SODIUM CHLORIDE 0.9 % IV BOLUS
1000.0000 mL | Freq: Once | INTRAVENOUS | Status: AC
  Administered 2021-09-26: 18:00:00 1000 mL via INTRAVENOUS

## 2021-09-26 MED ORDER — PANTOPRAZOLE INFUSION (NEW) - SIMPLE MED
8.0000 mg/h | INTRAVENOUS | Status: AC
  Administered 2021-09-26 – 2021-09-29 (×6): 8 mg/h via INTRAVENOUS
  Filled 2021-09-26: qty 80
  Filled 2021-09-26: qty 100
  Filled 2021-09-26 (×2): qty 80
  Filled 2021-09-26 (×2): qty 100
  Filled 2021-09-26 (×2): qty 80

## 2021-09-26 MED ORDER — PANTOPRAZOLE SODIUM 40 MG IV SOLR
40.0000 mg | Freq: Once | INTRAVENOUS | Status: AC
  Administered 2021-09-26: 22:00:00 40 mg via INTRAVENOUS
  Filled 2021-09-26: qty 40

## 2021-09-26 MED ORDER — PANTOPRAZOLE SODIUM 40 MG IV SOLR
40.0000 mg | Freq: Once | INTRAVENOUS | Status: AC
  Administered 2021-09-26: 20:00:00 40 mg via INTRAVENOUS
  Filled 2021-09-26: qty 40

## 2021-09-26 MED ORDER — LEVOTHYROXINE SODIUM 75 MCG PO TABS
75.0000 ug | ORAL_TABLET | Freq: Every morning | ORAL | Status: DC
  Administered 2021-09-27 – 2021-10-08 (×9): 75 ug via ORAL
  Filled 2021-09-26 (×10): qty 1

## 2021-09-26 MED ORDER — ONDANSETRON HCL 4 MG PO TABS: 4.0000 mg | ORAL_TABLET | Freq: Four times a day (QID) | ORAL | Status: DC | PRN

## 2021-09-26 MED ORDER — ACETAMINOPHEN 650 MG RE SUPP: 650.0000 mg | Freq: Four times a day (QID) | RECTAL | Status: DC | PRN

## 2021-09-26 NOTE — H&P (Signed)
History and Physical    Susan [name] DEY:[id number] DOB: Dec 20, 1940 DOA: 09/26/2021  PCP: Sonia Side., [name]   Patient coming from: Home  I have personally briefly reviewed patient's old medical records in [name]  Chief complaint: Melena, worsening anemia History of present illness: 80 year old female with a history of hypertension, hypothyroidism, history of GI AVMs, chronic anemia presented to the ER today with worsening anemia and melanotic stools.  Patient was seen 5 days ago but left without being seen due to long wait time.  Patient noted to have anemia on her CBC.  Patient presented today when the son came to visit her.  He had not heard from her in a day or so.  When he came to see her patient was confused.  Son thinks the patient has missed several days of her medications.  Patient had a bowel movement home.  Son states that this bowel movement was very dark and black.  Patient presented to the ER today.  Hemoccult positive stools.  Anemia worsened by about one gram since 3 days ago.  On arrival temp 97.5, heart rate 65, blood pressure 122/98 satting 100% on room air.   TSH elevated at 21.26  White count 6.2, hemoglobin 8.3, platelets 156  Sodium 146, potassium 3.6, chloride 114, BUN of 39, creatinine 1.7  Hemoccult stool was positive.  Due to GI bleed and worsening anemia, [facility] contacted for admission.   ED Course: Hemoglobin shows decreased since 4 days ago.  Hemoccult positive stools.  Started on Protonix drip.  Review of Systems:  Review of Systems  Unable to perform ROS: Dementia   Past Medical History:  Diagnosis Date   Anemia    Arthritis    Blood transfusion without reported diagnosis    GERD (gastroesophageal reflux disease)    GI bleed    HLD (hyperlipidemia)    Hypertension    Hypothyroidism    Memory loss    Myelodysplasia    Vitamin D deficiency     Past Surgical History:  Procedure Laterality Date   BIOPSY   01/09/2021   Procedure: BIOPSY;  Surgeon: Milus Banister, MD;  Location: [facility];  Service: Endoscopy;;   BIOPSY  05/04/2021   Procedure: BIOPSY;  Surgeon: Ladene Artist, MD;  Location: [facility];  Service: Endoscopy;;   CARPAL TUNNEL RELEASE Right    DILATION AND CURETTAGE OF UTERUS     ENTEROSCOPY N/A 05/04/2021   Procedure: ENTEROSCOPY;  Surgeon: Ladene Artist, MD;  Location: [facility];  Service: Endoscopy;  Laterality: N/A;   ESOPHAGOGASTRODUODENOSCOPY (EGD) WITH PROPOFOL N/A 01/09/2021   Procedure: ESOPHAGOGASTRODUODENOSCOPY (EGD) WITH PROPOFOL;  Surgeon: Milus Banister, MD;  Location: [facility];  Service: Endoscopy;  Laterality: N/A;   HOT HEMOSTASIS N/A 05/04/2021   Procedure: HOT HEMOSTASIS (ARGON PLASMA COAGULATION/BICAP);  Surgeon: Ladene Artist, MD;  Location: [facility];  Service: Endoscopy;  Laterality: N/A;   INGUINAL HERNIA REPAIR     IR ANGIOGRAM FOLLOW UP STUDY  03/13/2021   IR ANGIOGRAM VISCERAL SELECTIVE  03/13/2021   IR EMBO ART  VEN HEMORR LYMPH EXTRAV  INC GUIDE ROADMAPPING  03/13/2021   IR US GUIDE VASC ACCESS RIGHT  03/13/2021   ROTATOR CUFF REPAIR Left    SUBMUCOSAL TATTOO INJECTION  05/04/2021   Procedure: SUBMUCOSAL TATTOO INJECTION;  Surgeon: Ladene Artist, MD;  Location: [facility];  Service: Endoscopy;;   TRANSANAL HEMORRHOIDAL DEARTERIALIZATION N/A 03/31/2017   Procedure: TRANSANAL HEMORRHOIDAL DEARTERIALIZATION;  Surgeon: Leighton Ruff, MD;  Location: [facility];  Service: General;  Laterality: N/A;   UMBILICAL HERNIA REPAIR       reports that she quit smoking about 25 years ago. Her smoking use included cigarettes. She has never used smokeless tobacco. She reports current alcohol use. She reports that she does not use drugs.  Allergies  Allergen Reactions   Codeine Anaphylaxis   Penicillins Anaphylaxis   Bactrim Itching and Swelling   Clarithromycin Other (See Comments)    "Caused skin to peel off my hand."   Flagyl [Metronidazole Hcl]  Itching and Swelling   Food Other (See Comments)    EATS WHITE RICE ONLY (ALLERGIC REACTION TO YELLOW OR BROWN RICE THAT RESULTED IN HOSPITALIZATION)   Pineapple Itching and Swelling    SWELLING OF EYES   Sulfa Antibiotics Itching and Swelling    Family History  Problem Relation Age of Onset   Stroke Father    Hypertension Father    Other Mother        died at early age   Stroke Sister    Coronary artery disease Brother    Hypertension Sister    Bladder Cancer Sister    Colon cancer Neg Hx    Stomach cancer Neg Hx     Prior to Admission medications   Medication Sig Start Date End Date Taking? Authorizing Provider  acetaminophen (TYLENOL) 500 MG tablet Take 500 mg by mouth every 6 (six) hours as needed for moderate pain (for pain.).    [provider]  allopurinol (ZYLOPRIM) 300 MG tablet Take 300 mg by mouth daily. 07/20/20   [provider]  amLODipine (NORVASC) 10 MG tablet TAKE 1/2 TABLET BY MOUTH DAILY AT BEDTIME; (TAKE 1 TABLET IF SBP IS >150) 07/20/17   [name], NP  aspirin 81 MG chewable tablet Chew 81 mg by mouth daily.    [provider]  benzonatate (TESSALON) 100 MG capsule Take 1 capsule (100 mg total) by mouth every 8 (eight) hours. 06/21/21   Raspet, Erin K, PA-C  donepezil (ARICEPT) 10 MG tablet Take 10 mg by mouth daily. 07/20/20   [provider]  famotidine (PEPCID) 20 MG tablet Take 20 mg by mouth 2 (two) times daily. 03/08/21   [provider]  labetalol (NORMODYNE) 200 MG tablet Take 200 mg by mouth 2 (two) times daily. 06/23/20   [provider]  levothyroxine (SYNTHROID) 75 MCG tablet Take 75 mcg by mouth every morning. 12/18/20   [provider]  memantine (NAMENDA) 10 MG tablet Take 1 tablet (10 mg total) by mouth 2 (two) times daily. 11/30/20   [name], NP  mirtazapine (REMERON) 15 MG tablet Take 15 mg by mouth at bedtime. 07/20/20   [provider]  montelukast (SINGULAIR) 10  MG tablet Take 10 mg by mouth at bedtime.    [provider]  pantoprazole (PROTONIX) 40 MG tablet Take 1 tablet (40 mg total) by mouth daily. 05/06/21   [name], MD  traMADol (ULTRAM) 50 MG tablet Take 50 mg by mouth every 6 (six) hours as needed for moderate pain.    [provider]    Physical Exam: Vitals:   09/26/21 1800 09/26/21 1830 09/26/21 1900 09/26/21 2000  BP: 118/70 124/69 105/90 (!) 108/54  Pulse: 62 60 65 (!) 59  Resp:   16 17  Temp:    98.1 F (36.7 C)  TempSrc:    Oral  SpO2: 100% 100% 100% 100%  Physical Exam Vitals and nursing note reviewed.  Constitutional:      General: She is not in acute distress.    Appearance: Normal appearance. She is normal weight. She is not ill-appearing, toxic-appearing or diaphoretic.  HENT:     Head: Normocephalic and atraumatic.     Nose: Nose normal. No rhinorrhea.  Eyes:     General: No scleral icterus.       Right eye: No discharge.        Left eye: No discharge.  Cardiovascular:     Rate and Rhythm: Normal rate and regular rhythm.     Pulses: Normal pulses.  Pulmonary:     Effort: Pulmonary effort is normal. No respiratory distress.     Breath sounds: Normal breath sounds. No wheezing or rales.  Abdominal:     General: Abdomen is flat. Bowel sounds are normal. There is no distension.     Tenderness: There is no abdominal tenderness. There is no guarding.  Musculoskeletal:     Right lower leg: No edema.     Left lower leg: No edema.  Skin:    General: Skin is warm and dry.     Capillary Refill: Capillary refill takes less than 2 seconds.  Neurological:     General: No focal deficit present.     Mental Status: She is alert.     Comments: Not oriented to time or place.  Knows her son.     Labs on Admission: I have personally reviewed following labs and imaging studies  CBC: Recent Labs  Lab 09/24/21 2050 09/26/21 1748  WBC 6.6 6.2  NEUTROABS 4.2 4.3  HGB 9.2* 8.3*  HCT 29.3* 27.1*   MCV 73.4* 75.1*  PLT 179 128   Basic Metabolic Panel: Recent Labs  Lab 09/24/21 2050 09/26/21 1748  NA 144 146*  K 3.6 3.6  CL 111 114*  CO2 23 26  GLUCOSE 114* 120*  BUN 39* 39*  CREATININE 1.66* 1.69*  CALCIUM 10.4* 9.8   GFR: Estimated Creatinine Clearance: 22.5 mL/min (A) (by C-G formula based on SCr of 1.69 mg/dL (H)). Liver Function Tests: Recent Labs  Lab 09/24/21 2050 09/26/21 1748  AST 17 16  ALT 10 11  ALKPHOS 104 88  BILITOT 0.8 0.7  PROT 9.2* 8.0  ALBUMIN 4.4 3.8   No results for input(s): LIPASE, AMYLASE in the last 168 hours. No results for input(s): AMMONIA in the last 168 hours. Coagulation Profile: No results for input(s): INR, PROTIME in the last 168 hours. Cardiac Enzymes: No results for input(s): CKTOTAL, CKMB, CKMBINDEX, TROPONINI in the last 168 hours. BNP (last 3 results) No results for input(s): PROBNP in the last 8760 hours. HbA1C: No results for input(s): HGBA1C in the last 72 hours. CBG: No results for input(s): GLUCAP in the last 168 hours. Lipid Profile: No results for input(s): CHOL, HDL, LDLCALC, TRIG, CHOLHDL, LDLDIRECT in the last 72 hours. Thyroid Function Tests: Recent Labs    09/26/21 1755  TSH 21.262*   Anemia Panel: No results for input(s): VITAMINB12, FOLATE, FERRITIN, TIBC, IRON, RETICCTPCT in the last 72 hours. Urine analysis:    Component Value Date/Time   COLORURINE YELLOW 09/24/2021 2033   APPEARANCEUR CLEAR 09/24/2021 2033   LABSPEC 1.025 09/24/2021 2033   PHURINE 6.0 09/24/2021 2033   GLUCOSEU NEGATIVE 09/24/2021 2033   HGBUR SMALL (A) 09/24/2021 2033   BILIRUBINUR NEGATIVE 09/24/2021 2033   [place] NEGATIVE 09/24/2021 2033   PROTEINUR 100 (A) 09/24/2021 2033   UROBILINOGEN  1.0 07/07/2015 1917   NITRITE NEGATIVE 09/24/2021 2033   LEUKOCYTESUR NEGATIVE 09/24/2021 2033    Radiological Exams on Admission: I have personally reviewed images No results found.  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  IV Protonix drip.  Patient's been seen by [facility] in the past.  EDP is contacted [facility].  N.p.o. except for meds.  Patient has been typed and screened.  Acute on chronic blood loss anemia Likely secondary to GI bleed.  Monitor H&H every 6 hours.  Type and screen.  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  Patient had enteroscopy with laser ablation in August 2022.  Essential hypertension Stable.  Hold blood pressure medicines due to acute on chronic anemia.  Hypothyroidism Continue Synthroid at home dose.  Patient's TSH could be elevated due to noncompliance with her medications.  We will not make any changes to her Synthroid dose yet.  GERD (gastroesophageal reflux disease) Continue PPI  CKD, 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) Chronic.  Dementia without behavioral disturbance (HCC) Continue Aricept and Namenda.  Patient's son Selinda Flavin states that he is trying to get the patient moved down to [place] to be closer to him.  Patient may need social work involvement.  DVT prophylaxis: SCDs Code Status: Full Code Family Communication: Discussed with her son Selinda Flavin at bedside. Disposition Plan: return home  Consults called: EDP has consulted [facility]  Admission status: Observation, Telemetry bed   Kristopher Oppenheim, DO [facility] 09/26/2021, 8:59 PM

## 2021-09-26 NOTE — ED Provider Notes (Signed)
[facility] DEPT Provider Note   CSN: [id number] Arrival date & time: 09/26/21  1742     History Chief Complaint  Patient presents with   Failure To Thrive    Susan [name] is a 80 y.o. female hx of hypothyroidism, hyperlipidemia, hypertension, myelodysplasia requiring multiple transfusions here presenting with failure to thrive.  Patient has been having diffuse weakness and decreased appetite for the several days now.  Patient lives at home by herself.  Her son came to visit her from [place] and noticed that she is just weaker than usual.  Patient also appears very pale.  Patient came to the ED 2 days ago and had a hemoglobin of 9 which is baseline.  Patient's creatinine was at 1.5 which is similar to previous.  However patient left without being seen after triage.  Patient's symptoms have persisted and son noticed that she has been having black stools. Patient has not been taking her medicines as prescribed  The history is provided by the patient.      Past Medical History:  Diagnosis Date   Anemia    Arthritis    Blood transfusion without reported diagnosis    GERD (gastroesophageal reflux disease)    GI bleed    HLD (hyperlipidemia)    Hypertension    Hypothyroidism    Memory loss    Myelodysplasia    Vitamin D deficiency     Patient Active Problem List   Diagnosis Date Noted   GI bleeding 09/26/2021   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   Gastritis without bleeding    Thrombocytopenia (HCC) 05/03/2021   Allergic rhinitis 03/13/2021   Acute on chronic blood loss anemia 01/08/2021   Cerebral vascular disease 06/09/2020   Pain due to onychomycosis of toenails of both feet 04/08/2020   Porokeratosis 04/08/2020   Dementia without behavioral disturbance ([place]) 02/27/2020   Late onset Alzheimer's disease without behavioral disturbance ([place]) 06/12/2019   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   BMI 29.0-29.9,adult 09/02/2015    Encounter for Medicare annual wellness exam 06/02/2015   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[place]) 11/03/2014   CKD, 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) 11/02/2014   Medication management 01/27/2014   Hyperlipidemia 08/27/2013   Essential hypertension    Hypothyroidism    GERD (gastroesophageal reflux disease)    Vitamin D deficiency    Hemorrhoids 07/15/2013    Past Surgical History:  Procedure Laterality Date   BIOPSY  01/09/2021   Procedure: BIOPSY;  Surgeon: Milus Banister, MD;  Location: [facility];  Service: Endoscopy;;   BIOPSY  05/04/2021   Procedure: BIOPSY;  Surgeon: Ladene Artist, MD;  Location: [facility];  Service: Endoscopy;;   CARPAL TUNNEL RELEASE Right    DILATION AND CURETTAGE OF UTERUS     ENTEROSCOPY N/A 05/04/2021   Procedure: ENTEROSCOPY;  Surgeon: Ladene Artist, MD;  Location: [facility];  Service: Endoscopy;  Laterality: N/A;   ESOPHAGOGASTRODUODENOSCOPY (EGD) WITH PROPOFOL N/A 01/09/2021   Procedure: ESOPHAGOGASTRODUODENOSCOPY (EGD) WITH PROPOFOL;  Surgeon: Milus Banister, MD;  Location: [facility];  Service: Endoscopy;  Laterality: N/A;   HOT HEMOSTASIS N/A 05/04/2021   Procedure: HOT HEMOSTASIS (ARGON PLASMA COAGULATION/BICAP);  Surgeon: Ladene Artist, MD;  Location: [facility];  Service: Endoscopy;  Laterality: N/A;   INGUINAL HERNIA REPAIR     IR ANGIOGRAM FOLLOW UP STUDY  03/13/2021   IR ANGIOGRAM VISCERAL SELECTIVE  03/13/2021   IR EMBO ART  VEN [place]bers  ROADMAPPING  03/13/2021   IR US GUIDE VASC ACCESS RIGHT  03/13/2021   ROTATOR CUFF REPAIR Left    SUBMUCOSAL TATTOO INJECTION  05/04/2021   Procedure: SUBMUCOSAL TATTOO INJECTION;  Surgeon: Ladene Artist, MD;  Location: [facility];  Service: Endoscopy;;   TRANSANAL HEMORRHOIDAL DEARTERIALIZATION N/A 03/31/2017   Procedure: TRANSANAL HEMORRHOIDAL DEARTERIALIZATION;  Surgeon: Leighton Ruff, MD;  Location: [facility];  Service: General;  Laterality: N/A;   UMBILICAL HERNIA REPAIR        OB History   No obstetric history on file.     Family History  Problem Relation Age of Onset   Stroke Father    Hypertension Father    Other Mother        died at early age   Stroke Sister    Coronary artery disease Brother    Hypertension Sister    Bladder Cancer Sister    Colon cancer Neg Hx    Stomach cancer Neg Hx     Social History   Tobacco Use   Smoking status: Former    Types: Cigarettes    Quit date: 10/02/1996    Years since quitting: 25.0   Smokeless tobacco: Never  Vaping Use   Vaping Use: Never used  Substance Use Topics   Alcohol use: Yes    Comment: glass of wine at least once a month.   Drug use: No    Home Medications Prior to Admission medications   Medication Sig Start Date End Date Taking? Authorizing Provider  acetaminophen (TYLENOL) 500 MG tablet Take 500 mg by mouth every 6 (six) hours as needed for moderate pain (for pain.).   Yes [provider]  allopurinol (ZYLOPRIM) 300 MG tablet Take 300 mg by mouth daily. 07/20/20  Yes [provider]  amLODipine (NORVASC) 10 MG tablet TAKE 1/2 TABLET BY MOUTH DAILY AT BEDTIME; (TAKE 1 TABLET IF SBP IS >150) Patient taking differently: Take 10 mg by mouth daily. 07/20/17  Yes [name], NP  donepezil (ARICEPT) 10 MG tablet Take 10 mg by mouth daily. 07/20/20  Yes [provider]  famotidine (PEPCID) 20 MG tablet Take 20 mg by mouth 2 (two) times daily. 03/08/21  Yes [provider]  FEROSUL 325 (65 Fe) MG tablet Take 325 mg by mouth daily. 09/15/21  Yes [provider]  labetalol (NORMODYNE) 200 MG tablet Take 200 mg by mouth daily. 06/23/20  Yes [provider]  levothyroxine (SYNTHROID) 75 MCG tablet Take 75 mcg by mouth every morning. 12/18/20  Yes [provider]  memantine (NAMENDA) 10 MG tablet Take 1 tablet (10 mg total) by mouth 2 (two) times daily. 11/30/20  Yes [name], NP  mirtazapine (REMERON) 15 MG tablet Take 15 mg  by mouth at bedtime. 07/20/20  Yes [provider]  montelukast (SINGULAIR) 10 MG tablet Take 10 mg by mouth at bedtime.   Yes [provider]  pantoprazole (PROTONIX) 40 MG tablet Take 1 tablet (40 mg total) by mouth daily. 05/06/21  Yes [name], MD  predniSONE (DELTASONE) 5 MG tablet Take 5 mg by mouth as directed.   Yes [provider]  traMADol (ULTRAM) 50 MG tablet Take 50 mg by mouth every 6 (six) hours as needed for moderate pain.   Yes [provider]  aspirin 81 MG chewable tablet Chew 81 mg by mouth daily.    [provider]  benzonatate (TESSALON) 100 MG capsule Take 1 capsule (100 mg total) by mouth  every 8 (eight) hours. Patient not taking: Reported on 09/26/2021 06/21/21   Raspet, Derry Skill, PA-C    Allergies    Codeine, Penicillins, Bactrim, Clarithromycin, Flagyl [metronidazole hcl], Food, Pineapple, and Sulfa antibiotics  Review of Systems   Review of Systems  Gastrointestinal:  Positive for blood in stool.  Neurological:  Positive for weakness.  All other systems reviewed and are negative.  Physical Exam Updated Vital Signs BP 115/71 (BP Location: Left Arm)    Pulse (!) 57    Temp 98.2 F (36.8 C) (Oral)    Resp 14    Wt 55.2 kg    SpO2 100%    BMI 22.26 kg/m   Physical Exam Vitals and nursing note reviewed.  Constitutional:      Comments: Chronically ill, pale  HENT:     Head: Normocephalic.     Nose: Nose normal.     Mouth/Throat:     Mouth: Mucous membranes are dry.  Eyes:     Pupils: Pupils are equal, round, and reactive to light.     Comments: Conjunctiva is pale  Cardiovascular:     Rate and Rhythm: Normal rate and regular rhythm.     Pulses: Normal pulses.     Heart sounds: Normal heart sounds.  Pulmonary:     Effort: Pulmonary effort is normal.     Breath sounds: Normal breath sounds.  Abdominal:     General: Abdomen is flat.     Palpations: Abdomen is soft.  Genitourinary:    Comments: Rectal-dark  stool, no obvious hemorrhoids Musculoskeletal:        General: Normal range of motion.     Cervical back: Normal range of motion and neck supple.  Skin:    General: Skin is warm.     Capillary Refill: Capillary refill takes less than 2 seconds.  Neurological:     General: No focal deficit present.     Mental Status: She is oriented to person, place, and time.  Psychiatric:        Mood and Affect: Mood normal.        Behavior: Behavior normal.    ED Results / Procedures / Treatments   Labs (all labs ordered are listed, but only abnormal results are displayed) Labs Reviewed  CBC WITH DIFFERENTIAL/PLATELET - Abnormal; Notable for the following components:      Result Value   RBC 3.61 (*)    Hemoglobin 8.3 (*)    HCT 27.1 (*)    MCV 75.1 (*)    MCH 23.0 (*)    RDW 23.7 (*)    All other components within normal limits  COMPREHENSIVE METABOLIC PANEL - Abnormal; Notable for the following components:   Sodium 146 (*)    Chloride 114 (*)    Glucose, Bld 120 (*)    BUN 39 (*)    Creatinine, Ser 1.69 (*)    GFR, Estimated 30 (*)    All other components within normal limits  URINALYSIS, ROUTINE W REFLEX MICROSCOPIC - Abnormal; Notable for the following components:   Hgb urine dipstick LARGE (*)    Protein, ur TRACE (*)    Leukocytes,Ua MODERATE (*)    Bacteria, UA MANY (*)    All other components within normal limits  TSH - Abnormal; Notable for the following components:   TSH 21.262 (*)    All other components within normal limits  POC OCCULT BLOOD, ED - Abnormal; Notable for the following components:   Fecal Occult Bld POSITIVE (*)  All other components within normal limits  RESP PANEL BY RT-PCR (FLU A&B, COVID) ARPGX2  T4, FREE  T3  HEMOGLOBIN AND HEMATOCRIT, BLOOD  HEMOGLOBIN AND HEMATOCRIT, BLOOD  COMPREHENSIVE METABOLIC PANEL  MAGNESIUM  TYPE AND SCREEN    EKG None  Radiology No results found.  Procedures Procedures   Medications Ordered in  ED Medications  pantoprozole (PROTONIX) 80 mg /NS 100 mL infusion (8 mg/hr Intravenous New Bag/Given 09/26/21 2147)  donepezil (ARICEPT) tablet 10 mg (has no administration in time range)  memantine (NAMENDA) tablet 10 mg (has no administration in time range)  levothyroxine (SYNTHROID) tablet 75 mcg (has no administration in time range)  acetaminophen (TYLENOL) tablet 650 mg (has no administration in time range)    Or  acetaminophen (TYLENOL) suppository 650 mg (has no administration in time range)  ondansetron (ZOFRAN) tablet 4 mg (has no administration in time range)    Or  ondansetron (ZOFRAN) injection 4 mg (has no administration in time range)  dextrose 5 % in lactated ringers infusion ( Intravenous New Bag/Given 09/26/21 2256)  sodium chloride 0.9 % bolus 1,000 mL (0 mLs Intravenous Stopped 09/26/21 1918)  pantoprazole (PROTONIX) injection 40 mg (40 mg Intravenous Given 09/26/21 1944)  pantoprazole (PROTONIX) injection 40 mg (40 mg Intravenous Given 09/26/21 2146)    ED Course  I have reviewed the triage vital signs and the nursing notes.  Pertinent labs & imaging results that were available during my care of the patient were reviewed by me and considered in my medical decision making (see chart for details).    MDM Rules/Calculators/A&P                         Akeila Lana [place]field is a 80 y.o. female here with melena and failure to thrive. Patient has decreased appetite and has episode of melena.  Patient's overall functional status has been declining.   8:40 PM Hemoglobin dropped to 8.2 from 9.4 in 2 days.  Patient's guaiac is positive.  I ordered Protonix.  Hospitalist request Protonix IV infusion.  I contacted Dr. Rush Landmark from GI to see patient as consult.  He request that patient remains n.p.o. after midnight for possible endoscopy in the morning.  Hospitalist to admit     Final Clinical Impression(s) / ED Diagnoses Final diagnoses:  None    Rx / DC Orders ED Discharge  Orders     None        Drenda Freeze, MD 09/26/21 2314

## 2021-09-26 NOTE — Assessment & Plan Note (Signed)
Stable.  Hold blood pressure medicines due to acute on chronic anemia.

## 2021-09-26 NOTE — Assessment & Plan Note (Signed)
Continue PPI ?

## 2021-09-26 NOTE — Assessment & Plan Note (Signed)
Continue Aricept and Namenda.  Patient's son Selinda Flavin states that he is trying to get the patient moved down to [place] to be closer to him.  Patient may need social work involvement.

## 2021-09-26 NOTE — Assessment & Plan Note (Addendum)
Continue Synthroid at home dose.  Patient's TSH could be elevated due to noncompliance with her medications.  We will not make any changes to her Synthroid dose yet.

## 2021-09-26 NOTE — Assessment & Plan Note (Signed)
Chronic. 

## 2021-09-26 NOTE — Subjective & Objective (Signed)
Chief complaint: Melena, worsening anemia History of present illness: 80 year old female with a history of hypertension, hypothyroidism, history of GI AVMs, chronic anemia presented to the ER today with worsening anemia and melanotic stools.  Patient was seen 5 days ago but left without being seen due to long wait time.  Patient noted to have anemia on her CBC.  Patient presented today when the son came to visit her.  He had not heard from her in a day or so.  When he came to see her patient was confused.  Son thinks the patient has missed several days of her medications.  Patient had a bowel movement home.  Son states that this bowel movement was very dark and black.  Patient presented to the ER today.  Hemoccult positive stools.  Anemia worsened by about one gram since 3 days ago.  On arrival temp 97.5, heart rate 65, blood pressure 122/98 satting 100% on room air.   TSH elevated at 21.26  White count 6.2, hemoglobin 8.3, platelets 156  Sodium 146, potassium 3.6, chloride 114, BUN of 39, creatinine 1.7  Hemoccult stool was positive.  Due to GI bleed and worsening anemia, [facility] hospitalist contacted for admission.

## 2021-09-26 NOTE — ED Triage Notes (Signed)
EMS reports from home, family came to check on Pt, caretaker out of town for holiday, increasing lack of appetite, episode of dark stools and family concerned Pt is not at normal mobility. Hx of transfusions. Family states non-compliant with meds.  BP 110/70 HR 64 RR 20 Sp02 99 RA CBG 135.

## 2021-09-26 NOTE — Assessment & Plan Note (Signed)
Admit to observation telemetry bed.  IV Protonix drip.  Patient's been seen by [facility] in the past.  EDP is contacted [facility].  N.p.o. except for meds.  Patient has been typed and screened.

## 2021-09-26 NOTE — Assessment & Plan Note (Signed)
History of small bowel AVMs.  Patient had enteroscopy with laser ablation in August 2022.

## 2021-09-26 NOTE — Assessment & Plan Note (Signed)
Likely secondary to GI bleed.  Monitor H&H every 6 hours.  Type and screen.

## 2021-09-27 ENCOUNTER — Encounter (HOSPITAL_COMMUNITY): Payer: Self-pay | Admitting: Internal Medicine

## 2021-09-27 DIAGNOSIS — C182 Malignant neoplasm of ascending colon: Secondary | ICD-10-CM | POA: Diagnosis present

## 2021-09-27 DIAGNOSIS — I10 Essential (primary) hypertension: Secondary | ICD-10-CM | POA: Diagnosis not present

## 2021-09-27 DIAGNOSIS — K5520 Angiodysplasia of colon without hemorrhage: Secondary | ICD-10-CM

## 2021-09-27 DIAGNOSIS — K219 Gastro-esophageal reflux disease without esophagitis: Secondary | ICD-10-CM | POA: Diagnosis present

## 2021-09-27 DIAGNOSIS — D6959 Other secondary thrombocytopenia: Secondary | ICD-10-CM | POA: Diagnosis present

## 2021-09-27 DIAGNOSIS — D72828 Other elevated white blood cell count: Secondary | ICD-10-CM | POA: Diagnosis not present

## 2021-09-27 DIAGNOSIS — N179 Acute kidney failure, unspecified: Secondary | ICD-10-CM | POA: Diagnosis present

## 2021-09-27 DIAGNOSIS — K2971 Gastritis, unspecified, with bleeding: Secondary | ICD-10-CM | POA: Diagnosis not present

## 2021-09-27 DIAGNOSIS — R627 Adult failure to thrive: Secondary | ICD-10-CM | POA: Diagnosis present

## 2021-09-27 DIAGNOSIS — K921 Melena: Secondary | ICD-10-CM | POA: Diagnosis present

## 2021-09-27 DIAGNOSIS — K4090 Unilateral inguinal hernia, without obstruction or gangrene, not specified as recurrent: Secondary | ICD-10-CM | POA: Diagnosis present

## 2021-09-27 DIAGNOSIS — E785 Hyperlipidemia, unspecified: Secondary | ICD-10-CM | POA: Diagnosis present

## 2021-09-27 DIAGNOSIS — D469 Myelodysplastic syndrome, unspecified: Secondary | ICD-10-CM | POA: Diagnosis present

## 2021-09-27 DIAGNOSIS — I129 Hypertensive chronic kidney disease with stage 1 through stage 4 chronic kidney disease, or unspecified chronic kidney disease: Secondary | ICD-10-CM | POA: Diagnosis present

## 2021-09-27 DIAGNOSIS — K31819 Angiodysplasia of stomach and duodenum without bleeding: Secondary | ICD-10-CM

## 2021-09-27 DIAGNOSIS — N1832 Chronic kidney disease, stage 3b: Secondary | ICD-10-CM

## 2021-09-27 DIAGNOSIS — R531 Weakness: Secondary | ICD-10-CM | POA: Diagnosis present

## 2021-09-27 DIAGNOSIS — K2970 Gastritis, unspecified, without bleeding: Secondary | ICD-10-CM | POA: Diagnosis not present

## 2021-09-27 DIAGNOSIS — D509 Iron deficiency anemia, unspecified: Secondary | ICD-10-CM | POA: Diagnosis not present

## 2021-09-27 DIAGNOSIS — R195 Other fecal abnormalities: Secondary | ICD-10-CM | POA: Diagnosis not present

## 2021-09-27 DIAGNOSIS — Z8774 Personal history of (corrected) congenital malformations of heart and circulatory system: Secondary | ICD-10-CM | POA: Diagnosis not present

## 2021-09-27 DIAGNOSIS — D62 Acute posthemorrhagic anemia: Secondary | ICD-10-CM | POA: Diagnosis present

## 2021-09-27 DIAGNOSIS — E876 Hypokalemia: Secondary | ICD-10-CM | POA: Diagnosis not present

## 2021-09-27 DIAGNOSIS — E039 Hypothyroidism, unspecified: Secondary | ICD-10-CM | POA: Diagnosis present

## 2021-09-27 DIAGNOSIS — D631 Anemia in chronic kidney disease: Secondary | ICD-10-CM | POA: Diagnosis present

## 2021-09-27 DIAGNOSIS — K648 Other hemorrhoids: Secondary | ICD-10-CM | POA: Diagnosis present

## 2021-09-27 DIAGNOSIS — K922 Gastrointestinal hemorrhage, unspecified: Secondary | ICD-10-CM | POA: Diagnosis present

## 2021-09-27 DIAGNOSIS — D490 Neoplasm of unspecified behavior of digestive system: Secondary | ICD-10-CM | POA: Diagnosis not present

## 2021-09-27 DIAGNOSIS — Z20822 Contact with and (suspected) exposure to covid-19: Secondary | ICD-10-CM | POA: Diagnosis present

## 2021-09-27 DIAGNOSIS — K5669 Other partial intestinal obstruction: Secondary | ICD-10-CM | POA: Diagnosis present

## 2021-09-27 DIAGNOSIS — Z01818 Encounter for other preprocedural examination: Secondary | ICD-10-CM | POA: Diagnosis not present

## 2021-09-27 DIAGNOSIS — F0390 Unspecified dementia without behavioral disturbance: Secondary | ICD-10-CM | POA: Diagnosis not present

## 2021-09-27 DIAGNOSIS — F02A0 Dementia in other diseases classified elsewhere, mild, without behavioral disturbance, psychotic disturbance, mood disturbance, and anxiety: Secondary | ICD-10-CM | POA: Diagnosis present

## 2021-09-27 DIAGNOSIS — M1990 Unspecified osteoarthritis, unspecified site: Secondary | ICD-10-CM | POA: Diagnosis present

## 2021-09-27 DIAGNOSIS — E870 Hyperosmolality and hypernatremia: Secondary | ICD-10-CM | POA: Diagnosis not present

## 2021-09-27 DIAGNOSIS — K449 Diaphragmatic hernia without obstruction or gangrene: Secondary | ICD-10-CM | POA: Diagnosis present

## 2021-09-27 DIAGNOSIS — G301 Alzheimer's disease with late onset: Secondary | ICD-10-CM | POA: Diagnosis present

## 2021-09-27 LAB — COMPREHENSIVE METABOLIC PANEL
ALT: 10 U/L (ref 0–44)
AST: 13 U/L — ABNORMAL LOW (ref 15–41)
Albumin: 3.4 g/dL — ABNORMAL LOW (ref 3.5–5.0)
Alkaline Phosphatase: 78 U/L (ref 38–126)
Anion gap: 6 (ref 5–15)
BUN: 31 mg/dL — ABNORMAL HIGH (ref 8–23)
CO2: 25 mmol/L (ref 22–32)
Calcium: 9.1 mg/dL (ref 8.9–10.3)
Chloride: 116 mmol/L — ABNORMAL HIGH (ref 98–111)
Creatinine, Ser: 1.62 mg/dL — ABNORMAL HIGH (ref 0.44–1.00)
GFR, Estimated: 32 mL/min — ABNORMAL LOW (ref >60)
Glucose, Bld: 93 mg/dL (ref 70–99)
Potassium: 3.3 mmol/L — ABNORMAL LOW (ref 3.5–5.1)
Sodium: 147 mmol/L — ABNORMAL HIGH (ref 135–145)
Total Bilirubin: 0.7 mg/dL (ref 0.3–1.2)
Total Protein: 7.1 g/dL (ref 6.5–8.1)

## 2021-09-27 LAB — TYPE AND SCREEN
ABO/RH(D): O POS
Antibody Screen: POSITIVE

## 2021-09-27 LAB — HEMOGLOBIN AND HEMATOCRIT, BLOOD
HCT: 24.3 % — ABNORMAL LOW (ref 36.0–46.0)
HCT: 23.3 % — ABNORMAL LOW (ref 36.0–46.0)
HCT: 24.9 % — ABNORMAL LOW (ref 36.0–46.0)
HCT: 24.4 % — ABNORMAL LOW (ref 36.0–46.0)
Hemoglobin: 7.7 g/dL — ABNORMAL LOW (ref 12.0–15.0)
Hemoglobin: 7.3 g/dL — ABNORMAL LOW (ref 12.0–15.0)
Hemoglobin: 7.7 g/dL — ABNORMAL LOW (ref 12.0–15.0)
Hemoglobin: 7.6 g/dL — ABNORMAL LOW (ref 12.0–15.0)

## 2021-09-27 LAB — T4, FREE: Free T4: 0.76 ng/dL (ref 0.61–1.12)

## 2021-09-27 LAB — MAGNESIUM: Magnesium: 2.2 mg/dL (ref 1.7–2.4)

## 2021-09-27 MED ORDER — POTASSIUM CHLORIDE 10 MEQ/100ML IV SOLN
10.0000 meq | INTRAVENOUS | Status: AC
  Administered 2021-09-27 (×4): 10 meq via INTRAVENOUS
  Filled 2021-09-27 (×4): qty 100

## 2021-09-27 MED ORDER — LIP MEDEX EX OINT
TOPICAL_OINTMENT | CUTANEOUS | Status: AC
  Administered 2021-09-27: 75
  Filled 2021-09-27: qty 7

## 2021-09-27 NOTE — Plan of Care (Signed)

## 2021-09-27 NOTE — Progress Notes (Signed)
Mobility Specialist - Progress Note    09/27/21 1214  Mobility  Activity Ambulated in room;Transferred:  Bed to chair  Level of Assistance Minimal assist, patient does 75% or more  Assistive Device Front wheel walker  Distance Ambulated (ft) 40 ft  Mobility Ambulated with assistance in room  Mobility Response Tolerated well  Mobility performed by Mobility specialist  $Mobility charge 1 Mobility   Upon entry pt was agreeable to mobilize and required steadying assist to sit EOB and Min A to stand. Pt used RW to ambulate ~40 ft in room, but c/o of bilateral knee pain, stated it gets worse with fast movements. At end of session, pt ambulated to recliner and was left sitting up with call bell at side. RN informed of session.   [place] Specialist [facility] Phone: [phone] 09/27/21, 12:17 PM

## 2021-09-27 NOTE — Plan of Care (Signed)

## 2021-09-27 NOTE — Progress Notes (Signed)
PROGRESS NOTE  Susan [name] NKN:[id number] DOB: Sep 28, 1941 DOA: 09/26/2021 PCP: Sonia Side., [name]  HPI/Recap of past 54 hours: 80 year old female with a history of hypertension, hypothyroidism, history of GI AVMs, chronic anemia, MDS presented to the ER with worsening anemia and melanotic stools. Patient was in the ER 2 days ago but left without being seen due to long wait time. Son brought patient as he had not heard from her in a day or so. He noted she was confused and may have missed several days of her meds. Son noted pt had black stools. Due to this she was brought to the ED. In the ED, VSS, labs showed hgb 8.3, worsened by 1G since 2 days ago. TSH elevated at 21.26. Hemoccult positive stools.  Patient was started on Protonix drip and admitted for further management.    Today, patient denies any new complaints, no further melanotic stool has been noticed since on the floor.  Patient denies any chest pain, shortness of breath, abdominal pain, nausea/vomiting, fever/chills.    Assessment/Plan: Principal Problem:   GI bleeding Active Problems:   Essential hypertension   Hypothyroidism   GERD (gastroesophageal reflux disease)   CKD, 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)   Dementia without behavioral disturbance (HCC)   Acute on chronic blood loss anemia   Hx of arteriovenous malformation (AVM)   Likely upper GI bleeding History of AVMs Hemoglobin trending downwards, baseline around 9 FOBT positive Small bowel enteroscopy done 05/2021 showed a single nonbleeding AVM in the proximal jejunum, also showed diverticular bleed s/p angiography with coil embolization on 03/2021 GI consulted Continue Protonix drip, IV fluids, n.p.o. Monitor closely  Acute on chronic anemia of chronic disease Multifactorial including CKD, MDS Baseline hemoglobin around 9, trended down to 7.3 Type and screen, transfuse hemoglobin less than 7 Continue ferrous sulfate p.o., once able Follows with hematologist  Dr. Alen Blew Frequent CBC  Hypokalemia Replace as needed  Elevated TSH with history of hypothyroidism TSH 21.2, free T4 0.76 Continue Synthroid  CKD stage IIIb Creatinine at baseline Daily BMP  Hypertension BP stable Continue to hold BP meds for now  Dementia without behavioral disturbance Continue Aricept, Namenda   Estimated body mass index is 22.26 kg/m as calculated from the following:   Height as of this encounter: 5\' 2"  (1.575 m).   Weight as of this encounter: 55.2 kg.     Code Status: Full  Family Communication: None at bedside  Disposition Plan: Status is: Observation  The patient will require care spanning > 2 midnights and should be moved to inpatient because: Level of care    Consultants: GI  Procedures: None  Antimicrobials: Normal  DVT prophylaxis: SCDs   Objective: Vitals:   09/26/21 2224 09/27/21 0242 09/27/21 0637 09/27/21 1110  BP: 115/71 117/75 136/73 139/82  Pulse: (!) 57 62 (!) 55 (!) 59  Resp: 14 14 14 16   Temp: 98.2 F (36.8 C) 97.9 F (36.6 C) 98.1 F (36.7 C) 98.2 F (36.8 C)  TempSrc: Oral Oral Oral Oral  SpO2: 100% 96% 100% 99%  Weight: 55.2 kg     Height: 5\' 2"  (1.575 m)       Intake/Output Summary (Last 24 hours) at 09/27/2021 1156 Last data filed at 09/27/2021 1001 Gross per 24 hour  Intake 830.82 ml  Output --  Net 830.82 ml   Filed Weights   09/26/21 2224  Weight: 55.2 kg    Exam: General: NAD  Cardiovascular: S1, S2 present Respiratory: CTAB  Abdomen: Soft, nontender, nondistended, bowel sounds present Musculoskeletal: No bilateral pedal edema noted Skin: Normal Psychiatry: Normal mood    Data Reviewed: CBC: Recent Labs  Lab 09/24/21 2050 09/26/21 1748 09/27/21 0030 09/27/21 0525  WBC 6.6 6.2  --   --   NEUTROABS 4.2 4.3  --   --   HGB 9.2* 8.3* 7.7* 7.3*  HCT 29.3* 27.1* 24.3* 23.3*  MCV 73.4* 75.1*  --   --   PLT 179 156  --   --    Basic Metabolic Panel: Recent Labs  Lab  09/24/21 2050 09/26/21 1748 09/27/21 0525  NA 144 146* 147*  K 3.6 3.6 3.3*  CL 111 114* 116*  CO2 23 26 25   GLUCOSE 114* 120* 93  BUN 39* 39* 31*  CREATININE 1.66* 1.69* 1.62*  CALCIUM 10.4* 9.8 9.1  MG  --   --  2.2   GFR: Estimated Creatinine Clearance: 21.2 mL/min (A) (by C-G formula based on SCr of 1.62 mg/dL (H)). Liver Function Tests: Recent Labs  Lab 09/24/21 2050 09/26/21 1748 09/27/21 0525  AST 17 16 13*  ALT 10 11 10   ALKPHOS 104 88 78  BILITOT 0.8 0.7 0.7  PROT 9.2* 8.0 7.1  ALBUMIN 4.4 3.8 3.4*   No results for input(s): LIPASE, AMYLASE in the last 168 hours. No results for input(s): AMMONIA in the last 168 hours. Coagulation Profile: No results for input(s): INR, PROTIME in the last 168 hours. Cardiac Enzymes: No results for input(s): CKTOTAL, CKMB, CKMBINDEX, TROPONINI in the last 168 hours. BNP (last 3 results) No results for input(s): PROBNP in the last 8760 hours. HbA1C: No results for input(s): HGBA1C in the last 72 hours. CBG: No results for input(s): GLUCAP in the last 168 hours. Lipid Profile: No results for input(s): CHOL, HDL, LDLCALC, TRIG, CHOLHDL, LDLDIRECT in the last 72 hours. Thyroid Function Tests: Recent Labs    09/26/21 1755 09/26/21 2029  TSH 21.262*  --   FREET4  --  0.76   Anemia Panel: No results for input(s): VITAMINB12, FOLATE, FERRITIN, TIBC, IRON, RETICCTPCT in the last 72 hours. Urine analysis:    Component Value Date/Time   COLORURINE YELLOW 09/26/2021 [place] 09/26/2021 1748   LABSPEC 1.020 09/26/2021 1748   PHURINE 6.0 09/26/2021 [place] 09/26/2021 1748   HGBUR LARGE (A) 09/26/2021 [place] 09/26/2021 [place] 09/26/2021 1748   PROTEINUR TRACE (A) 09/26/2021 1748   UROBILINOGEN 1.0 07/07/2015 1917   NITRITE NEGATIVE 09/26/2021 1748   LEUKOCYTESUR MODERATE (A) 09/26/2021 1748   Sepsis  Labs: @LABRCNTIP (procalcitonin:4,lacticidven:4)  ) Recent Results (from the past 240 hour(s))  Resp Panel by RT-PCR (Flu A&B, Covid) Nasopharyngeal Swab     Status: None   Collection Time: 09/26/21  7:12 PM   Specimen: Nasopharyngeal Swab; Nasopharyngeal(NP) swabs in vial transport medium  Result Value Ref Range Status   SARS Coronavirus 2 by RT PCR NEGATIVE NEGATIVE Final    Comment: (NOTE) SARS-CoV-2 target nucleic acids are NOT DETECTED.  The SARS-CoV-2 RNA is generally detectable in upper respiratory specimens during the acute phase of infection. The lowest concentration of SARS-CoV-2 viral copies this assay can detect is 138 copies/mL. A negative result does not preclude SARS-Cov-2 infection and should not be used as the sole basis for treatment or other patient management decisions. A negative result may occur with  improper specimen collection/handling, submission of specimen other than nasopharyngeal swab, presence of viral mutation(s)  within the areas targeted by this assay, and inadequate number of viral copies(<138 copies/mL). A negative result must be combined with clinical observations, patient history, and epidemiological information. The expected result is Negative.  Fact Sheet for Patients:  EntrepreneurPulse.com.au  Fact Sheet for Healthcare Providers:  IncredibleEmployment.be  This test is no t yet approved or cleared by the [place] FDA and  has been authorized for detection and/or diagnosis of SARS-CoV-2 by FDA under an Emergency Use Authorization (EUA). This EUA will remain  in effect (meaning this test can be used) for the duration of the COVID-19 declaration under Section 564(b)(1) of the Act, 21 U.S.C.section 360bbb-3(b)(1), unless the authorization is terminated  or revoked sooner.       Influenza A by PCR NEGATIVE NEGATIVE Final   Influenza B by PCR NEGATIVE NEGATIVE Final    Comment: (NOTE) The Xpert  Xpress SARS-CoV-2/FLU/RSV plus assay is intended as an aid in the diagnosis of influenza from Nasopharyngeal swab specimens and should not be used as a sole basis for treatment. Nasal washings and aspirates are unacceptable for Xpert Xpress SARS-CoV-2/FLU/RSV testing.  Fact Sheet for Patients: EntrepreneurPulse.com.au  Fact Sheet for Healthcare Providers: IncredibleEmployment.be  This test is not yet approved or cleared by the [place] FDA and has been authorized for detection and/or diagnosis of SARS-CoV-2 by FDA under an Emergency Use Authorization (EUA). This EUA will remain in effect (meaning this test can be used) for the duration of the COVID-19 declaration under Section 564(b)(1) of the Act, 21 U.S.C. section 360bbb-3(b)(1), unless the authorization is terminated or revoked.  Performed at [facility] [address], [place], [place] [address]       Studies: No results found.  Scheduled Meds:  donepezil  10 mg Oral Daily   levothyroxine  75 mcg Oral q morning   memantine  10 mg Oral BID    Continuous Infusions:  dextrose 5% lactated ringers 75 mL/hr at 09/27/21 1001   pantoprazole Stopped (09/27/21 0826)   potassium chloride 10 mEq (09/27/21 1104)     LOS: 0 days     Alma Friendly, MD [facility]  If 7PM-7AM, please contact night-coverage [email or web address] 09/27/2021, 11:56 AM

## 2021-09-27 NOTE — Consult Note (Signed)
Referring Provider: Dr. Shirlyn Goltz Primary Care Physician:  Sonia Side., [name] Primary Gastroenterologist:  Dr. Silverio Decamp   Reason for Consultation:  GI bleed, melena and anemia   HPI: Susan [name] is a 80 y.o. female with a past medical history of arthritis, hypertension, hypothyroidism, anemia, thrombocytopenia, myelodysplasia, CKD stage III, mild dementia, GERD, UGI bleed/melena April and August 2022 and diverticular bleed s/p angiography with coil embolization of a colic branch of the SMA  03/2021.  She presented to the ED 09/24/2021 by her son due to generalized weakness with poor po intake. Her son was concerned she was not taking her medications as prescribed.  Labs showed a Hg level of 9. FOBT +. Patient and son left prior to being evaluated by the ED physician. She returned back to the ED 09/26/2021 after her son noticed her stools were dark. She is on Ferrous Sulfate 325mg  QD. Prescribed Protonix 40mg  QD and Famotidine 20mg  bid. On ASA 81mg  QD. Not on blood thinners. Labs in the ED showed a Hg level of 8.3. BUN 39 (BUN 25 on 12/16). Cr. 1.69 (Cr 1.74 on 12/16). TSH 21.262.   She is awake and alert this morning.  She told me she passed a solid dark stool yesterday, she kept wiping and continue to see black stool on the toilet tissue.  No bright red rectal bleeding.  No upper or lower abdominal pain.  No associated rectal pain.  She denies taking any aspirin/Goody powder or other NSAIDs.  She is not on any blood thinners.  Alcohol use.  Her most recent colonoscopy was 02/10/2017 which showed severe diverticulosis to the left colon, transverse and ascending colon.  No polyps.  No dysphagia or heartburn.  No nausea or vomiting.  No known family history of esophageal, gastric or colon cancer.  Prior hospital admission 05/2021 with recurrent UGI bleed with Hg 5.8 ( Hg 9.8 on 6/14). BUN 99 (baseline BUN 96 on 6/14).  Received 2 units PRBCs and PPI infusion. 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 Stable Hg 9 at time of discharge 05/06/2021.  History of myelodysplasia followed by hematologist Dr. Alen Blew. Hg 7.9 on 08/24/2021. Last received PRBC transfusion at the cancer center or 09/03/2021 -> Hg 8.2 -> Hg  8.6, iron 36 on 09/17/2021.  Past GI procedures:  Small bowel enteroscopy  05/04/2021: - Esophageal plaques were found, suspicious for candidiasis. Biopsied. - A single spot with bleeding in the gastric cardia. Treated with argon plasma coagulation (APC). - Small hiatal hernia. - Gastritis. Biopsied. - Normal examined duodenum. 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 Treated with argon plasma coagulation (APC). - A tattoo was placed in the proximal jejunum at the distal extent of the exam. The tattoo site appeared normal.  EGD 01/09/2021: - The GE junction was friable in a spoke like pattern, and there was a small amount of red blood at the site. This may represent reflux esophagitis. - Mild, non-specific gastritis. Biopsied to check for H. Pylori A. GASTRIC, BIOPSY:  - Gastric antral and oxyntic mucosa with mild chronic gastritis  - Warthin Starry stain is negative for Helicobacter pylori    Colonoscopy 02/10/2017: - Severe diverticulosis in the sigmoid colon, in the descending colon, in the transverse colon and in the ascending colon. There was narrowing of the colon in association with  the diverticular opening. There was evidence of diverticular spasm. Peri-diverticular erythema was seen. There was evidence of an impacted diverticulum. Petechia were visualized in association with the diverticular opening. - Non-bleeding internal hemorrhoids. - The examination was otherwise normal. - No specimens collected.   Past Medical History:  Diagnosis Date   Anemia    Arthritis     Blood transfusion without reported diagnosis    GERD (gastroesophageal reflux disease)    GI bleed    HLD (hyperlipidemia)    Hypertension    Hypothyroidism    Memory loss    Myelodysplasia    Vitamin D deficiency     Past Surgical History:  Procedure Laterality Date   BIOPSY  01/09/2021   Procedure: BIOPSY;  Surgeon: Milus Banister, MD;  Location: [facility];  Service: Endoscopy;;   BIOPSY  05/04/2021   Procedure: BIOPSY;  Surgeon: Ladene Artist, MD;  Location: [facility];  Service: Endoscopy;;   CARPAL TUNNEL RELEASE Right    DILATION AND CURETTAGE OF UTERUS     ENTEROSCOPY N/A 05/04/2021   Procedure: ENTEROSCOPY;  Surgeon: Ladene Artist, MD;  Location: [facility];  Service: Endoscopy;  Laterality: N/A;   ESOPHAGOGASTRODUODENOSCOPY (EGD) WITH PROPOFOL N/A 01/09/2021   Procedure: ESOPHAGOGASTRODUODENOSCOPY (EGD) WITH PROPOFOL;  Surgeon: Milus Banister, MD;  Location: [facility];  Service: Endoscopy;  Laterality: N/A;   HOT HEMOSTASIS N/A 05/04/2021   Procedure: HOT HEMOSTASIS (ARGON PLASMA COAGULATION/BICAP);  Surgeon: Ladene Artist, MD;  Location: [facility];  Service: Endoscopy;  Laterality: N/A;   INGUINAL HERNIA REPAIR     IR ANGIOGRAM FOLLOW UP STUDY  03/13/2021   IR ANGIOGRAM VISCERAL SELECTIVE  03/13/2021   IR EMBO ART  VEN HEMORR LYMPH EXTRAV  INC GUIDE ROADMAPPING  03/13/2021   IR US GUIDE VASC ACCESS RIGHT  03/13/2021   ROTATOR CUFF REPAIR Left    SUBMUCOSAL TATTOO INJECTION  05/04/2021   Procedure: SUBMUCOSAL TATTOO INJECTION;  Surgeon: Ladene Artist, MD;  Location: [facility];  Service: Endoscopy;;   TRANSANAL HEMORRHOIDAL DEARTERIALIZATION N/A 03/31/2017   Procedure: TRANSANAL HEMORRHOIDAL DEARTERIALIZATION;  Surgeon: Leighton Ruff, MD;  Location: [facility];  Service: General;  Laterality: N/A;   UMBILICAL HERNIA REPAIR      Prior to Admission medications   Medication Sig Start Date End Date Taking? Authorizing Provider  acetaminophen (TYLENOL) 500 MG  tablet Take 500 mg by mouth every 6 (six) hours as needed for moderate pain (for pain.).   Yes [provider]  allopurinol (ZYLOPRIM) 300 MG tablet Take 300 mg by mouth daily. 07/20/20  Yes [provider]  amLODipine (NORVASC) 10 MG tablet TAKE 1/2 TABLET BY MOUTH DAILY AT BEDTIME; (TAKE 1 TABLET IF SBP IS >150) Patient taking differently: Take 10 mg by mouth daily. 07/20/17  Yes [name], NP  donepezil (ARICEPT) 10 MG tablet Take 10 mg by mouth daily. 07/20/20  Yes [provider]  famotidine (PEPCID) 20 MG tablet Take 20 mg by mouth 2 (two) times daily. 03/08/21  Yes [provider]  FEROSUL 325 (65 Fe) MG tablet Take 325 mg by mouth daily. 09/15/21  Yes [provider]  labetalol (NORMODYNE) 200 MG tablet Take 200 mg by mouth daily. 06/23/20  Yes [provider]  levothyroxine (SYNTHROID) 75 MCG tablet Take 75 mcg by mouth every morning. 12/18/20  Yes [provider]  memantine (NAMENDA) 10 MG tablet Take 1 tablet (10 mg total) by mouth 2 (two) times daily. 11/30/20  Yes Olegario Messier,  Charlynne Cousins, NP  mirtazapine (REMERON) 15 MG tablet Take 15 mg by mouth at bedtime. 07/20/20  Yes [provider]  montelukast (SINGULAIR) 10 MG tablet Take 10 mg by mouth at bedtime.   Yes [provider]  pantoprazole (PROTONIX) 40 MG tablet Take 1 tablet (40 mg total) by mouth daily. 05/06/21  Yes [name], MD  predniSONE (DELTASONE) 5 MG tablet Take 5 mg by mouth as directed.   Yes [provider]  traMADol (ULTRAM) 50 MG tablet Take 50 mg by mouth every 6 (six) hours as needed for moderate pain.   Yes [provider]  aspirin 81 MG chewable tablet Chew 81 mg by mouth daily.    [provider]  benzonatate (TESSALON) 100 MG capsule Take 1 capsule (100 mg total) by mouth every 8 (eight) hours. Patient not taking: Reported on 09/26/2021 06/21/21   Raspet, Derry Skill, PA-C    Current Facility-Administered  Medications  Medication Dose Route Frequency Provider Last Rate Last Admin   acetaminophen (TYLENOL) tablet 650 mg  650 mg Oral Q6H PRN Kristopher Oppenheim, DO       Or   acetaminophen (TYLENOL) suppository 650 mg  650 mg Rectal Q6H PRN Kristopher Oppenheim, DO       dextrose 5 % in lactated ringers infusion   Intravenous Continuous Kristopher Oppenheim, DO 50 mL/hr at 09/27/21 0343 Infusion Verify at 09/27/21 0343   donepezil (ARICEPT) tablet 10 mg  10 mg Oral Daily Kristopher Oppenheim, DO       levothyroxine (SYNTHROID) tablet 75 mcg  75 mcg Oral q morning Kristopher Oppenheim, DO       memantine [facility]) tablet 10 mg  10 mg Oral BID Kristopher Oppenheim, DO   10 mg at 09/27/21 0001   ondansetron (ZOFRAN) tablet 4 mg  4 mg Oral Q6H PRN Kristopher Oppenheim, DO       Or   ondansetron [facility]) injection 4 mg  4 mg Intravenous Q6H PRN Kristopher Oppenheim, DO       pantoprozole (PROTONIX) 80 mg /NS 100 mL infusion  8 mg/hr Intravenous Continuous Kristopher Oppenheim, DO 10 mL/hr at 09/27/21 0343 8 mg/hr at 09/27/21 0343    Allergies as of 09/26/2021 - Review Complete 09/26/2021  Allergen Reaction Noted   Codeine Anaphylaxis 12/28/2011   Penicillins Anaphylaxis 12/28/2011   Bactrim Itching and Swelling 12/28/2011   Clarithromycin Other (See Comments) 12/28/2011   Flagyl [metronidazole hcl] Itching and Swelling 12/28/2011   Food Other (See Comments) 03/24/2017   Pineapple Itching and Swelling 03/24/2017   Sulfa antibiotics Itching and Swelling 12/28/2011    Family History  Problem Relation Age of Onset   Stroke Father    Hypertension Father    Other Mother        died at early age   Stroke Sister    Coronary artery disease Brother    Hypertension Sister    Bladder Cancer Sister    Colon cancer Neg Hx    Stomach cancer Neg Hx     Social History   Socioeconomic History   Marital status: Single    Spouse name: Not on file   Number of children: 1   Years of education: college   Highest education level: Bachelor's degree (e.g., BA, AB, BS)  Occupational History    Occupation: retired    Fish farm manager: RETIRED  Tobacco Use   Smoking status: Former    Types: Cigarettes    Quit date: 10/02/1996    Years since quitting:  25.0   Smokeless tobacco: Never  Vaping Use   Vaping Use: Never used  Substance and Sexual Activity   Alcohol use: Yes    Comment: glass of wine at least once a month.   Drug use: No   Sexual activity: Not Currently    Partners: Male  Other Topics Concern   Not on file  Social History Narrative   Lives at home alone.   Right-handed.   No daily use of caffeine.   Social Determinants of Health   Financial Resource Strain: Not on file  Food Insecurity: Not on file  Transportation Needs: Not on file  Physical Activity: Not on file  Stress: Not on file  Social Connections: Not on file  Intimate Partner Violence: Not on file   Review of Systems: See HPI, all other systems reviewed and are negative  Physical Exam: Vital signs in last 24 hours: Temp:  [97.5 F (36.4 C)-98.2 F (36.8 C)] 97.9 F (36.6 C) (12/26 0242) Pulse Rate:  [57-65] 62 (12/26 0242) Resp:  [14-18] 14 (12/26 0242) BP: (105-128)/(54-98) 117/75 (12/26 0242) SpO2:  [96 %-100 %] 96 % (12/26 0242) Weight:  [55.2 kg] 55.2 kg (12/25 2224) Last BM Date: 09/26/21 General:  Alert 80 year old female in no acute distress. Head:  Normocephalic and atraumatic. Eyes:  No scleral icterus. Conjunctiva pink. Ears:  Normal auditory acuity. Nose:  No deformity, discharge or lesions. Mouth: Upper dentures no ulcers or lesions.  Neck:  Supple. No lymphadenopathy or thyromegaly.  Lungs: Breath sounds clear throughout Heart: Regular rate and rhythm, no murmurs. Abdomen:, Nondistended.  Nontender.  Positive bowel sounds all 4 quadrants.  No HSM. Rectal: Deferred. Musculoskeletal:  Symmetrical without gross deformities.  Pulses:  Normal pulses noted. Extremities:  Without clubbing or edema. Neurologic:  Alert and  oriented x 4. No focal deficits.  Skin:  Intact without  significant lesions or rashes. Psych:  Alert and cooperative. Normal mood and affect.  Intake/Output from previous day: 12/25 0701 - 12/26 0700 In: 317.1 [P.O.:20; I.V.:297.1] Out: -  Intake/Output this shift: Total I/O In: 317.1 [P.O.:20; I.V.:297.1] Out: -   Lab Results: Recent Labs    09/24/21 2050 09/26/21 1748 09/27/21 0030  WBC 6.6 6.2  --   HGB 9.2* 8.3* 7.7*  HCT 29.3* 27.1* 24.3*  PLT 179 156  --    BMET Recent Labs    09/24/21 2050 09/26/21 1748  NA 144 146*  K 3.6 3.6  CL 111 114*  CO2 23 26  GLUCOSE 114* 120*  BUN 39* 39*  CREATININE 1.66* 1.69*  CALCIUM 10.4* 9.8   LFT Recent Labs    09/26/21 1748  PROT 8.0  ALBUMIN 3.8  AST 16  ALT 11  ALKPHOS 88  BILITOT 0.7   PT/INR No results for input(s): LABPROT, INR in the last 72 hours. Hepatitis Panel No results for input(s): HEPBSAG, HCVAB, HEPAIGM, HEPBIGM in the last 72 hours.    Studies/Results: No results found.  IMPRESSION/PLAN:  80 year old female with a history of UGI and lower GI bleed (small bowel enteroscopy 05/2021 showed a spot of bleeding in the gastric cardia and a single nonbleeding AVM in the prox jejunum and diverticular bleed s/p angiography with coil embolization of a colic branch of the SMA  03/2021) who was admitted to the hospital 09/25/2021 with generalized weakness, dark stool, + FOBT with anemia.  No GERD symptoms or abdominal pain.  Admission Hg 8.3 -> 7.7. No active GI bleeding since admission.  She remains hemodynamically stable.  -Check H/H Q 6 hrs x 24 hrs   -Recommend transfuse for Hg < 8 (receiving PRBC transfusions PRN per hematology, last PRBC transfusion by the cancer center was on 09/03/2021)) -NPO -Continue PPI infusion -IV fluids 75cc/hr -Small bowel enteroscopy +/- colonoscopy 09/28/2021, await further recommendations per Dr. Rush Landmark  Acute on chronic anemia, multifactorial: CKD and recurrent GI bleed in setting of myelodysplastic syndrome and iron  deficiency. She is on Ferrous Sulfate po, Aranasep Q 4 weeks and PRBC transfusions as needed followed by hematologist Dr. Alen Blew -Recommend hematology consult during this admission   Thrombocytopenia  CKD stage III. Cr 1.62  Hypokalemia. K+ 3.3 -KCl replacement per the hospitalist  Hypothyroidism with TSH 21.262 (likely reflecting noncompliance with Levothyroxine  Dementia, mild -She will likely need nursing management for medications at her assisted living facility     [facility]  09/27/2021, 08:40Am

## 2021-09-28 DIAGNOSIS — N1832 Chronic kidney disease, stage 3b: Secondary | ICD-10-CM | POA: Diagnosis not present

## 2021-09-28 DIAGNOSIS — K2970 Gastritis, unspecified, without bleeding: Secondary | ICD-10-CM | POA: Diagnosis not present

## 2021-09-28 DIAGNOSIS — D62 Acute posthemorrhagic anemia: Secondary | ICD-10-CM | POA: Diagnosis not present

## 2021-09-28 DIAGNOSIS — Z8774 Personal history of (corrected) congenital malformations of heart and circulatory system: Secondary | ICD-10-CM | POA: Diagnosis not present

## 2021-09-28 DIAGNOSIS — K921 Melena: Secondary | ICD-10-CM | POA: Diagnosis not present

## 2021-09-28 DIAGNOSIS — I10 Essential (primary) hypertension: Secondary | ICD-10-CM | POA: Diagnosis not present

## 2021-09-28 DIAGNOSIS — K219 Gastro-esophageal reflux disease without esophagitis: Secondary | ICD-10-CM

## 2021-09-28 LAB — BASIC METABOLIC PANEL
Anion gap: 4 — ABNORMAL LOW (ref 5–15)
BUN: 19 mg/dL (ref 8–23)
CO2: 26 mmol/L (ref 22–32)
Calcium: 9.1 mg/dL (ref 8.9–10.3)
Chloride: 112 mmol/L — ABNORMAL HIGH (ref 98–111)
Creatinine, Ser: 1.35 mg/dL — ABNORMAL HIGH (ref 0.44–1.00)
GFR, Estimated: 39 mL/min — ABNORMAL LOW (ref >60)
Glucose, Bld: 91 mg/dL (ref 70–99)
Potassium: 3.7 mmol/L (ref 3.5–5.1)
Sodium: 142 mmol/L (ref 135–145)

## 2021-09-28 LAB — CBC WITH DIFFERENTIAL/PLATELET
Abs Immature Granulocytes: 0.03 10*3/uL (ref 0.00–0.07)
Basophils Absolute: 0 10*3/uL (ref 0.0–0.1)
Basophils Relative: 0 %
Eosinophils Absolute: 0.1 10*3/uL (ref 0.0–0.5)
Eosinophils Relative: 1 %
HCT: 24.7 % — ABNORMAL LOW (ref 36.0–46.0)
Hemoglobin: 7.8 g/dL — ABNORMAL LOW (ref 12.0–15.0)
Immature Granulocytes: 1 %
Lymphocytes Relative: 28 %
Lymphs Abs: 1.5 10*3/uL (ref 0.7–4.0)
MCH: 23.6 pg — ABNORMAL LOW (ref 26.0–34.0)
MCHC: 31.6 g/dL (ref 30.0–36.0)
MCV: 74.6 fL — ABNORMAL LOW (ref 80.0–100.0)
Monocytes Absolute: 0.5 10*3/uL (ref 0.1–1.0)
Monocytes Relative: 9 %
Neutro Abs: 3.3 10*3/uL (ref 1.7–7.7)
Neutrophils Relative %: 61 %
Platelets: 140 10*3/uL — ABNORMAL LOW (ref 150–400)
RBC: 3.31 MIL/uL — ABNORMAL LOW (ref 3.87–5.11)
RDW: 23.4 % — ABNORMAL HIGH (ref 11.5–15.5)
WBC: 5.3 10*3/uL (ref 4.0–10.5)
nRBC: 0 % (ref 0.0–0.2)

## 2021-09-28 LAB — URINE CULTURE: Culture: 30000 — AB

## 2021-09-28 LAB — T3: T3, Total: 36 ng/dL — ABNORMAL LOW (ref 71–180)

## 2021-09-28 MED ORDER — PEG-KCL-NACL-NASULF-NA ASC-C 100 G PO SOLR: 1.0000 | Freq: Once | ORAL | Status: DC

## 2021-09-28 MED ORDER — PEG-KCL-NACL-NASULF-NA ASC-C 100 G PO SOLR
0.5000 | Freq: Once | ORAL | Status: AC
  Administered 2021-09-28: 18:00:00 100 g via ORAL
  Filled 2021-09-28: qty 1

## 2021-09-28 MED ORDER — PEG-KCL-NACL-NASULF-NA ASC-C 100 G PO SOLR
0.5000 | Freq: Once | ORAL | Status: AC
  Administered 2021-09-28: 23:00:00 100 g via ORAL

## 2021-09-28 NOTE — H&P (View-Only) (Signed)
[place] [facility] ROUNDING NOTE   Subjective: She is having multiple loose bowel movements with the prep Denies any abdominal pain   Objective: Vital signs in last 24 hours: Temp:  [98.3 F (36.8 C)-98.4 F (36.9 C)] 98.3 F (36.8 C) (12/27 0517) Pulse Rate:  [61] 61 (12/27 0517) Resp:  [14] 14 (12/27 0517) BP: (129-156)/(78-85) 156/85 (12/27 0517) SpO2:  [100 %] 100 % (12/27 0517) Weight:  [61.3 kg] 61.3 kg (12/27 0517) Last BM Date: 09/26/21 General: NAD Abdomen: Soft, not distended and non tender     Intake/Output from previous day: 12/26 0701 - 12/27 0700 In: 2231.5 [P.O.:360; I.V.:1571.6; IV Piggyback:299.9] Out: -  Intake/Output this shift: No intake/output data recorded.   Lab Results: Recent Labs    09/26/21 1748 09/27/21 0030 09/27/21 1237 09/27/21 2014 09/28/21 0523  WBC 6.2  --   --   --  5.3  HGB 8.3*   < > 7.7* 7.6* 7.8*  PLT 156  --   --   --  140*  MCV 75.1*  --   --   --  74.6*   < > = values in this interval not displayed.   BMET Recent Labs    09/26/21 1748 09/27/21 0525 09/28/21 0523  NA 146* 147* 142  K 3.6 3.3* 3.7  CL 114* 116* 112*  CO2 26 25 26   GLUCOSE 120* 93 91  BUN 39* 31* 19  CREATININE 1.69* 1.62* 1.35*  CALCIUM 9.8 9.1 9.1   LFT Recent Labs    09/26/21 1748 09/27/21 0525  PROT 8.0 7.1  ALBUMIN 3.8 3.4*  AST 16 13*  ALT 11 10  ALKPHOS 88 78  BILITOT 0.7 0.7   PT/INR No results for input(s): INR in the last 72 hours.    Imaging/Other results: No results found.    Assessment &Plan  80 year old female with history of hypothyroidism, hypertension, MDS, chronic anemia admitted with worsening anemia, fecal Hemoccult positive She has history of diverticular hemorrhage and also gastric /small bowel AVMs We will plan to proceed with push enteroscopy and colonoscopy for diagnostic and endoscopic intervention if needed for worsening anemia and melena Bowel prep Clear liquid diet N.p.o. after 7  AM  The risks and benefits as well as alternatives of endoscopic procedure(s) have been discussed and reviewed. All questions answered. The patient agrees to proceed.     Damaris Hippo , MD [phone]  [facility]

## 2021-09-28 NOTE — Plan of Care (Signed)
°  Problem: Clinical Measurements: °Goal: Diagnostic test results will improve °Outcome: Progressing °  °Problem: Clinical Measurements: °Goal: Will remain free from infection °Outcome: Progressing °  °

## 2021-09-28 NOTE — Progress Notes (Signed)
[place] [facility] ROUNDING NOTE   Subjective: She is having multiple loose bowel movements with the prep Denies any abdominal pain   Objective: Vital signs in last 24 hours: Temp:  [98.3 F (36.8 C)-98.4 F (36.9 C)] 98.3 F (36.8 C) (12/27 0517) Pulse Rate:  [61] 61 (12/27 0517) Resp:  [14] 14 (12/27 0517) BP: (129-156)/(78-85) 156/85 (12/27 0517) SpO2:  [100 %] 100 % (12/27 0517) Weight:  [61.3 kg] 61.3 kg (12/27 0517) Last BM Date: 09/26/21 General: NAD Abdomen: Soft, not distended and non tender     Intake/Output from previous day: 12/26 0701 - 12/27 0700 In: 2231.5 [P.O.:360; I.V.:1571.6; IV Piggyback:299.9] Out: -  Intake/Output this shift: No intake/output data recorded.   Lab Results: Recent Labs    09/26/21 1748 09/27/21 0030 09/27/21 1237 09/27/21 2014 09/28/21 0523  WBC 6.2  --   --   --  5.3  HGB 8.3*   < > 7.7* 7.6* 7.8*  PLT 156  --   --   --  140*  MCV 75.1*  --   --   --  74.6*   < > = values in this interval not displayed.   BMET Recent Labs    09/26/21 1748 09/27/21 0525 09/28/21 0523  NA 146* 147* 142  K 3.6 3.3* 3.7  CL 114* 116* 112*  CO2 26 25 26   GLUCOSE 120* 93 91  BUN 39* 31* 19  CREATININE 1.69* 1.62* 1.35*  CALCIUM 9.8 9.1 9.1   LFT Recent Labs    09/26/21 1748 09/27/21 0525  PROT 8.0 7.1  ALBUMIN 3.8 3.4*  AST 16 13*  ALT 11 10  ALKPHOS 88 78  BILITOT 0.7 0.7   PT/INR No results for input(s): INR in the last 72 hours.    Imaging/Other results: No results found.    Assessment &Plan  80 year old female with history of hypothyroidism, hypertension, MDS, chronic anemia admitted with worsening anemia, fecal Hemoccult positive She has history of diverticular hemorrhage and also gastric /small bowel AVMs We will plan to proceed with push enteroscopy and colonoscopy for diagnostic and endoscopic intervention if needed for worsening anemia and melena Bowel prep Clear liquid diet N.p.o. after 7  AM  The risks and benefits as well as alternatives of endoscopic procedure(s) have been discussed and reviewed. All questions answered. The patient agrees to proceed.     Damaris Hippo , MD [phone]  [facility]

## 2021-09-28 NOTE — Progress Notes (Signed)
PROGRESS NOTE  Susan [name] JGO:[id number] DOB: 01/11/1941 DOA: 09/26/2021 PCP: Sonia Side., [name]  HPI/Recap of past 83 hours: 80 year old female with a history of hypertension, hypothyroidism, history of GI AVMs, chronic anemia, MDS presented to the ER with worsening anemia and melanotic stools. Patient was in the ER 2 days ago but left without being seen due to long wait time. Son brought patient as he had not heard from her in a day or so. He noted she was confused and may have missed several days of her meds. Son noted pt had black stools. Due to this she was brought to the ED. In the ED, VSS, labs showed hgb 8.3, worsened by 1G since 2 days ago. TSH elevated at 21.26. Hemoccult positive stools.  Patient was started on Protonix drip and admitted for further management.    Today, patient has had no further melanotic stool, has not had any bowel movement since being in the hospital.  Patient denies any abdominal pain, nausea/vomiting, fever/chills, chest pain, shortness of breath.  Patient scheduled to have enteroscopy today    Assessment/Plan: Principal Problem:   GI bleeding Active Problems:   Essential hypertension   Hypothyroidism   GERD (gastroesophageal reflux disease)   CKD, 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)   Dementia without behavioral disturbance (HCC)   Acute on chronic blood loss anemia   Hx of arteriovenous malformation (AVM)   GI bleed   Likely upper GI bleeding History of AVMs Hemoglobin trending downwards, baseline around 9 FOBT positive Small bowel enteroscopy done 05/2021 showed a single nonbleeding AVM in the proximal jejunum, also showed diverticular bleed s/p angiography with coil embolization on 03/2021 GI consulted, plan for procedure on 09/29/21 Continue Protonix drip, CLD, npo at midnight Monitor closely  Acute on chronic anemia of chronic disease Multifactorial including CKD, MDS Baseline hemoglobin around 9, trended down to 7.3 on admission Type and  screen, transfuse hemoglobin less than 7 Continue ferrous sulfate p.o., once able Follows with hematologist Dr. Alen Blew CBC  Hypokalemia Replace as needed  Elevated TSH with history of hypothyroidism TSH 21.2, free T4 0.76 Continue Synthroid  CKD stage IIIb Creatinine at baseline Daily BMP  Hypertension BP stable Continue to hold BP meds for now  Dementia without behavioral disturbance Continue Aricept, Namenda   Estimated body mass index is 24.72 kg/m as calculated from the following:   Height as of this encounter: '5\' 2"'  (1.575 m).   Weight as of this encounter: 61.3 kg.     Code Status: Full  Family Communication: None at bedside  Disposition Plan: Status is: Inpatient  The patient will require care spanning > 2 midnights and should be moved to inpatient because: Level of care    Consultants: GI  Procedures: None  Antimicrobials: None  DVT prophylaxis: SCDs- GI bleed   Objective: Vitals:   09/27/21 1110 09/27/21 1433 09/27/21 2329 09/28/21 0517  BP: 139/82 120/83 129/78 (!) 156/85  Pulse: (!) 59 62 61 61  Resp: '16 18 14 14  ' Temp: 98.2 F (36.8 C) 98.3 F (36.8 C) 98.4 F (36.9 C) 98.3 F (36.8 C)  TempSrc: Oral Oral Oral Oral  SpO2: 99% 97% 100% 100%  Weight:    61.3 kg  Height:        Intake/Output Summary (Last 24 hours) at 09/28/2021 1323 Last data filed at 09/28/2021 0251 Gross per 24 hour  Intake 1381.02 ml  Output --  Net 1381.02 ml   Filed Weights   09/26/21  2224 09/28/21 0517  Weight: 55.2 kg 61.3 kg    Exam: General: NAD  Cardiovascular: S1, S2 present Respiratory: CTAB Abdomen: Soft, nontender, nondistended, bowel sounds present Musculoskeletal: No bilateral pedal edema noted Skin: Normal Psychiatry: Normal mood    Data Reviewed: CBC: Recent Labs  Lab 09/24/21 2050 09/26/21 1748 09/27/21 0030 09/27/21 0525 09/27/21 1237 09/27/21 2014 09/28/21 0523  WBC 6.6 6.2  --   --   --   --  5.3  NEUTROABS 4.2 4.3   --   --   --   --  3.3  HGB 9.2* 8.3* 7.7* 7.3* 7.7* 7.6* 7.8*  HCT 29.3* 27.1* 24.3* 23.3* 24.9* 24.4* 24.7*  MCV 73.4* 75.1*  --   --   --   --  74.6*  PLT 179 156  --   --   --   --  757*   Basic Metabolic Panel: Recent Labs  Lab 09/24/21 2050 09/26/21 1748 09/27/21 0525 09/28/21 0523  NA 144 146* 147* 142  K 3.6 3.6 3.3* 3.7  CL 111 114* 116* 112*  CO2 '23 26 25 26  ' GLUCOSE 114* 120* 93 91  BUN 39* 39* 31* 19  CREATININE 1.66* 1.69* 1.62* 1.35*  CALCIUM 10.4* 9.8 9.1 9.1  MG  --   --  2.2  --    GFR: Estimated Creatinine Clearance: 27.7 mL/min (A) (by C-G formula based on SCr of 1.35 mg/dL (H)). Liver Function Tests: Recent Labs  Lab 09/24/21 2050 09/26/21 1748 09/27/21 0525  AST 17 16 13*  ALT '10 11 10  ' ALKPHOS 104 88 78  BILITOT 0.8 0.7 0.7  PROT 9.2* 8.0 7.1  ALBUMIN 4.4 3.8 3.4*   No results for input(s): LIPASE, AMYLASE in the last 168 hours. No results for input(s): AMMONIA in the last 168 hours. Coagulation Profile: No results for input(s): INR, PROTIME in the last 168 hours. Cardiac Enzymes: No results for input(s): CKTOTAL, CKMB, CKMBINDEX, TROPONINI in the last 168 hours. BNP (last 3 results) No results for input(s): PROBNP in the last 8760 hours. HbA1C: No results for input(s): HGBA1C in the last 72 hours. CBG: No results for input(s): GLUCAP in the last 168 hours. Lipid Profile: No results for input(s): CHOL, HDL, LDLCALC, TRIG, CHOLHDL, LDLDIRECT in the last 72 hours. Thyroid Function Tests: Recent Labs    09/26/21 1755 09/26/21 2029  TSH 21.262*  --   FREET4  --  0.76   Anemia Panel: No results for input(s): VITAMINB12, FOLATE, FERRITIN, TIBC, IRON, RETICCTPCT in the last 72 hours. Urine analysis:    Component Value Date/Time   COLORURINE YELLOW 09/26/2021 [place] 09/26/2021 1748   LABSPEC 1.020 09/26/2021 1748   PHURINE 6.0 09/26/2021 [place] 09/26/2021 1748   HGBUR LARGE (A) 09/26/2021 [place] 09/26/2021 [place] 09/26/2021 1748   PROTEINUR TRACE (A) 09/26/2021 1748   UROBILINOGEN 1.0 07/07/2015 1917   NITRITE NEGATIVE 09/26/2021 1748   LEUKOCYTESUR MODERATE (A) 09/26/2021 1748   Sepsis Labs: '@LABRCNTIP' (procalcitonin:4,lacticidven:4)  ) Recent Results (from the past 240 hour(s))  Resp Panel by RT-PCR (Flu A&B, Covid) Nasopharyngeal Swab     Status: None   Collection Time: 09/26/21  7:12 PM   Specimen: Nasopharyngeal Swab; Nasopharyngeal(NP) swabs in vial transport medium  Result Value Ref Range Status   SARS Coronavirus 2 by RT PCR NEGATIVE NEGATIVE Final    Comment: (NOTE) SARS-CoV-2 target nucleic acids are NOT DETECTED.  The SARS-CoV-2 RNA is generally detectable in upper respiratory specimens during the acute phase of infection. The lowest concentration of SARS-CoV-2 viral copies this assay can detect is 138 copies/mL. A negative result does not preclude SARS-Cov-2 infection and should not be used as the sole basis for treatment or other patient management decisions. A negative result may occur with  improper specimen collection/handling, submission of specimen other than nasopharyngeal swab, presence of viral mutation(s) within the areas targeted by this assay, and inadequate number of viral copies(<138 copies/mL). A negative result must be combined with clinical observations, patient history, and epidemiological information. The expected result is Negative.  Fact Sheet for Patients:  EntrepreneurPulse.com.au  Fact Sheet for Healthcare Providers:  IncredibleEmployment.be  This test is no t yet approved or cleared by the [place] FDA and  has been authorized for detection and/or diagnosis of SARS-CoV-2 by FDA under an Emergency Use Authorization (EUA). This EUA will remain  in effect (meaning this test can be used) for the duration of the COVID-19 declaration under Section 564(b)(1)  of the Act, 21 U.S.C.section 360bbb-3(b)(1), unless the authorization is terminated  or revoked sooner.       Influenza A by PCR NEGATIVE NEGATIVE Final   Influenza B by PCR NEGATIVE NEGATIVE Final    Comment: (NOTE) The Xpert Xpress SARS-CoV-2/FLU/RSV plus assay is intended as an aid in the diagnosis of influenza from Nasopharyngeal swab specimens and should not be used as a sole basis for treatment. Nasal washings and aspirates are unacceptable for Xpert Xpress SARS-CoV-2/FLU/RSV testing.  Fact Sheet for Patients: EntrepreneurPulse.com.au  Fact Sheet for Healthcare Providers: IncredibleEmployment.be  This test is not yet approved or cleared by the [place] FDA and has been authorized for detection and/or diagnosis of SARS-CoV-2 by FDA under an Emergency Use Authorization (EUA). This EUA will remain in effect (meaning this test can be used) for the duration of the COVID-19 declaration under Section 564(b)(1) of the Act, 21 U.S.C. section 360bbb-3(b)(1), unless the authorization is terminated or revoked.  Performed at [facility], [place] [address], [place], [place] [address]   Urine Culture     Status: Abnormal   Collection Time: 09/27/21  9:08 AM   Specimen: Urine, Clean Catch  Result Value Ref Range Status   Specimen Description   Final    URINE, CLEAN CATCH Performed at [facility], [place] [address] [place], [place] [address]    Special Requests   Final    NONE Performed at [facility], [place] [address], [place], [place] [address]    Culture (A)  Final    30,000 COLONIES/mL STREPTOCOCCUS AGALACTIAE TESTING AGAINST S. AGALACTIAE NOT ROUTINELY PERFORMED DUE TO PREDICTABILITY OF AMP/PEN/VAN SUSCEPTIBILITY. Performed at [facility], [place] [address], [place], [place] [address]    Report Status 09/28/2021 FINAL  Final      Studies: No results found.  Scheduled  Meds:  donepezil  10 mg Oral Daily   levothyroxine  75 mcg Oral q morning   memantine  10 mg Oral BID   peg 3350 powder  0.5 kit Oral Once   And   peg 3350 powder  0.5 kit Oral Once    Continuous Infusions:  pantoprazole 8 mg/hr (09/28/21 1128)     LOS: 1 day     Alma Friendly, MD [facility]  If 7PM-7AM, please contact night-coverage [email or web address] 09/28/2021, 1:23 PM

## 2021-09-29 ENCOUNTER — Inpatient Hospital Stay (HOSPITAL_COMMUNITY): Payer: Medicare Other | Admitting: Certified Registered Nurse Anesthetist

## 2021-09-29 ENCOUNTER — Encounter (HOSPITAL_COMMUNITY): Payer: Self-pay | Admitting: Internal Medicine

## 2021-09-29 ENCOUNTER — Encounter (HOSPITAL_COMMUNITY)
Admission: EM | Disposition: A | Discharge status: Skilled Nursing Facility | Payer: Self-pay | Source: Home / Self Care | Attending: Internal Medicine

## 2021-09-29 DIAGNOSIS — K2971 Gastritis, unspecified, with bleeding: Secondary | ICD-10-CM | POA: Diagnosis not present

## 2021-09-29 DIAGNOSIS — D490 Neoplasm of unspecified behavior of digestive system: Secondary | ICD-10-CM

## 2021-09-29 DIAGNOSIS — K2970 Gastritis, unspecified, without bleeding: Secondary | ICD-10-CM | POA: Diagnosis not present

## 2021-09-29 DIAGNOSIS — K921 Melena: Secondary | ICD-10-CM | POA: Diagnosis not present

## 2021-09-29 HISTORY — PX: SUBMUCOSAL TATTOO INJECTION: SHX6856

## 2021-09-29 HISTORY — PX: BIOPSY: SHX5522

## 2021-09-29 HISTORY — PX: ENTEROSCOPY: SHX5533

## 2021-09-29 HISTORY — PX: COLONOSCOPY: SHX5424

## 2021-09-29 LAB — BASIC METABOLIC PANEL
Anion gap: 10 (ref 5–15)
BUN: 23 mg/dL (ref 8–23)
CO2: 21 mmol/L — ABNORMAL LOW (ref 22–32)
Calcium: 9.4 mg/dL (ref 8.9–10.3)
Chloride: 110 mmol/L (ref 98–111)
Creatinine, Ser: 1.62 mg/dL — ABNORMAL HIGH (ref 0.44–1.00)
GFR, Estimated: 32 mL/min — ABNORMAL LOW (ref >60)
Glucose, Bld: 72 mg/dL (ref 70–99)
Potassium: 3.7 mmol/L (ref 3.5–5.1)
Sodium: 141 mmol/L (ref 135–145)

## 2021-09-29 LAB — CBC WITH DIFFERENTIAL/PLATELET
Abs Immature Granulocytes: 0.02 10*3/uL (ref 0.00–0.07)
Basophils Absolute: 0 10*3/uL (ref 0.0–0.1)
Basophils Relative: 0 %
Eosinophils Absolute: 0 10*3/uL (ref 0.0–0.5)
Eosinophils Relative: 1 %
HCT: 26.2 % — ABNORMAL LOW (ref 36.0–46.0)
Hemoglobin: 8.5 g/dL — ABNORMAL LOW (ref 12.0–15.0)
Immature Granulocytes: 0 %
Lymphocytes Relative: 31 %
Lymphs Abs: 1.7 10*3/uL (ref 0.7–4.0)
MCH: 23.8 pg — ABNORMAL LOW (ref 26.0–34.0)
MCHC: 32.4 g/dL (ref 30.0–36.0)
MCV: 73.4 fL — ABNORMAL LOW (ref 80.0–100.0)
Monocytes Absolute: 0.6 10*3/uL (ref 0.1–1.0)
Monocytes Relative: 12 %
Neutro Abs: 3 10*3/uL (ref 1.7–7.7)
Neutrophils Relative %: 56 %
Platelets: 143 10*3/uL — ABNORMAL LOW (ref 150–400)
RBC: 3.57 MIL/uL — ABNORMAL LOW (ref 3.87–5.11)
RDW: 24.2 % — ABNORMAL HIGH (ref 11.5–15.5)
WBC: 5.3 10*3/uL (ref 4.0–10.5)
nRBC: 0 % (ref 0.0–0.2)

## 2021-09-29 SURGERY — ENTEROSCOPY
Anesthesia: Monitor Anesthesia Care

## 2021-09-29 MED ORDER — PROPOFOL 500 MG/50ML IV EMUL
INTRAVENOUS | Status: DC | PRN
  Administered 2021-09-29: 125 ug/kg/min via INTRAVENOUS

## 2021-09-29 MED ORDER — GLYCOPYRROLATE 0.2 MG/ML IJ SOLN
INTRAMUSCULAR | Status: DC | PRN
  Administered 2021-09-29: .1 mg via INTRAVENOUS

## 2021-09-29 MED ORDER — PROPOFOL 10 MG/ML IV BOLUS
INTRAVENOUS | Status: DC | PRN
  Administered 2021-09-29: 30 mg via INTRAVENOUS

## 2021-09-29 MED ORDER — PROPOFOL 500 MG/50ML IV EMUL
INTRAVENOUS | Status: AC
  Filled 2021-09-29: qty 50

## 2021-09-29 MED ORDER — EPHEDRINE SULFATE-NACL 50-0.9 MG/10ML-% IV SOSY
PREFILLED_SYRINGE | INTRAVENOUS | Status: DC | PRN
  Administered 2021-09-29 (×3): 5 mg via INTRAVENOUS

## 2021-09-29 MED ORDER — SPOT INK MARKER SYRINGE KIT
PACK | SUBMUCOSAL | Status: DC | PRN
  Administered 2021-09-29: 2.5 mL via SUBMUCOSAL

## 2021-09-29 MED ORDER — SODIUM CHLORIDE 0.9 % IV SOLN: INTRAVENOUS | Status: DC

## 2021-09-29 MED ORDER — LIDOCAINE 2% (20 MG/ML) 5 ML SYRINGE
INTRAMUSCULAR | Status: DC | PRN
  Administered 2021-09-29: 80 mg via INTRAVENOUS

## 2021-09-29 MED ORDER — SPOT INK MARKER SYRINGE KIT
PACK | SUBMUCOSAL | Status: AC
  Filled 2021-09-29: qty 5

## 2021-09-29 MED ORDER — LACTATED RINGERS IV SOLN: INTRAVENOUS | Status: DC

## 2021-09-29 NOTE — Op Note (Signed)
[facility] Patient Name: Susan [name] Procedure Date: 09/29/2021 MRN: [id number] Attending MD: Mauri Pole , MD Date of Birth: [birth year] CSN: [id number] Age: 80 Admit Type: Inpatient Procedure:                Small bowel enteroscopy Indications:              Melena, Obscure gastrointestinal bleeding, Occult                            blood in stool Providers:                Mauri Pole, MD, Jeanella Cara, RN,                            Luan Moore, Technician, [facility],                            CRNA Referring MD:              Medicines:                Monitored Anesthesia Care Complications:            No immediate complications. Estimated Blood Loss:     Estimated blood loss was minimal. Procedure:                Pre-Anesthesia Assessment:                           - Prior to the procedure, a History and Physical                            was performed, and patient medications and                            allergies were reviewed. The patient's tolerance of                            previous anesthesia was also reviewed. The risks                            and benefits of the procedure and the sedation                            options and risks were discussed with the patient.                            All questions were answered, and informed consent                            was obtained. Prior Anticoagulants: The patient has                            taken no previous anticoagulant or antiplatelet                            agents. ASA Grade Assessment:  III - A patient with                            severe systemic disease. After reviewing the risks                            and benefits, the patient was deemed in                            satisfactory condition to undergo the procedure.                           After obtaining informed consent, the endoscope was                            passed under  direct vision. Throughout the                            procedure, the patient's blood pressure, pulse, and                            oxygen saturations were monitored continuously. The                            [id number]-HQ190L ([id number]) Olympus colonoscope was                            introduced through the mouth and advanced to the                            proximal jejunum. The small bowel enteroscopy was                            accomplished without difficulty. The patient                            tolerated the procedure well. Scope In: Scope Out: Findings:      There was no evidence of significant pathology in the proximal jejunum.      The examined duodenum was normal.      Patchy mild inflammation characterized by congestion (edema) and       erythema was found in the entire examined stomach.      The esophagus was normal. Impression:               - The examined portion of the jejunum was normal.                           - Normal examined duodenum.                           - Gastritis.                           - Normal esophagus.                           -  No specimens collected. Recommendation:           - Resume previous diet.                           - Continue present medications. Procedure Code(s):        --- Professional ---                           [phone], Small intestinal endoscopy, enteroscopy                            beyond second portion of duodenum, not including                            ileum; diagnostic, including collection of                            specimen(s) by brushing or washing, when performed                            (separate procedure) Diagnosis Code(s):        --- Professional ---                           K29.70, Gastritis, unspecified, without bleeding                           K92.1, Melena (includes Hematochezia)                           K92.2, Gastrointestinal hemorrhage, unspecified                           R19.5, Other  fecal abnormalities CPT copyright 2019 [employer]. All rights reserved. The codes documented in this report are preliminary and upon coder review may  be revised to meet current compliance requirements. Mauri Pole, MD 09/29/2021 1:57:12 PM This report has been signed electronically. Number of Addenda: 0

## 2021-09-29 NOTE — Transfer of Care (Signed)
Immediate Anesthesia Transfer of Care Note  Patient: Susan [name]  Procedure(s) Performed: ENTEROSCOPY COLONOSCOPY BIOPSY SUBMUCOSAL TATTOO INJECTION  Patient Location: PACU  Anesthesia Type:MAC  Level of Consciousness: awake, alert  and oriented  Airway & Oxygen Therapy: Patient Spontanous Breathing and Patient connected to face mask oxygen  Post-op Assessment: Report given to RN and Post -op Vital signs reviewed and stable  Post vital signs: Reviewed and stable  Last Vitals:  Vitals Value Taken Time  BP    Temp    Pulse 106 09/29/21 1347  Resp 22 09/29/21 1347  SpO2 100 % 09/29/21 1347  Vitals shown include unvalidated device data.  Last Pain:  Vitals:   09/29/21 1207  TempSrc: Oral  PainSc: 0-No pain         Complications: No notable events documented.

## 2021-09-29 NOTE — Progress Notes (Signed)
PROGRESS NOTE    Verner Kopischke Tupper  NAT:[id number] DOB: Sep 15, 1941 DOA: 09/26/2021 PCP: Sonia Side., [name]   Chief Complaint  Patient presents with   Failure To Thrive   Brief Narrative/Hospital Course:  Cristine Polio Bracey, 80 y.o. female with PMH of hypertension, hypothyroidism, history of GI AV malformations with chronic anemia, MDS presented with anemia, melanotic stool after visiting the ER 2 days prior to [place] without being seen due to long wait time.  Per report  Son brought patient as he had not heard from her in a day or so. He noted she was confused and may have missed several days of her meds. Son noted pt had black stools. Due to this she was brought to the ED. In the ED, VSS, labs showed hgb 8.3, worsened by 1G since 2 days ago. TSH elevated at 21.26. Hemoccult positive stools.  Patient was started on Protonix drip and admitted for further management. GI was consulted   Subjective: Seen and examined this morning.  Patient denies any nausea vomiting abdominal pain.  Resting comfortably Hemoglobin has stayed stable 7 to 8 gram. No fever overnight.  Lab with stable electrolytes with creatinine 1.3-1.6 Blood pressure stable.  Assessment & Plan:  Melanotic stool Acute on chronic anemia History of GI AVMs and diverticular bleeding: With drop in hemoglobin from recent ED visit, admitted due to concern for GI bleeding, FOBT positive.Small bowel enteroscopy done 05/2021 showed a single nonbleeding AVM in the proximal jejunum, also showed diverticular bleed s/p angiography with coil embolization on 03/2021.  GI on consult, continue n.p.o. for enteroscopy and colonoscopy today. Cont Protonix drip, monitor H&H. Recent Labs  Lab 09/27/21 0525 09/27/21 1237 09/27/21 2014 09/28/21 0523 09/29/21 0539  HGB 7.3* 7.7* 7.6* 7.8* 8.5*  HCT 23.3* 24.9* 24.4* 24.7* 26.2*    Essential hypertension: BP is well controlled.  Continue to hold home amlodipine, labetalol  CKD Stage 3b:  Baseline creatinine ranging from 1.5 to 2s, here in 1.6 mostly Recent Labs  Lab 09/24/21 2050 09/26/21 1748 09/27/21 0525 09/28/21 0523 09/29/21 0539  BUN 39* 39* 31* 19 23  CREATININE 1.66* 1.69* 1.62* 1.35* 1.62*      Hypokalemia resolved Hypernatremia resolved Hypothyroidism: TSH was elevated 29 with free T4 normal 0.7.  On Synthroid 75 MCG daily.  Not sure if she is taking daily or not.  Will need follow-up TSH in 3 weeks to adjust the dose if remains elevated  GERD: Continue PPI  Dementia without behavioral disturbance: Mood stable, appears pleasant continue her Remeron, Aricept and Namenda  Pyurea/bacteriuria- no fever.  Monitor  DVT prophylaxis: SCDs Start: 09/26/21 2224 Code Status:   Code Status: Full Code Family Communication: plan of care discussed with patient at bedside. Status is: Inpatient Remains inpatient appropriate because: For ongoing work-up for her anemia and GI evaluation Disposition: Currently not medically stable for discharge. Anticipated Disposition: TBD  Objective: Vitals last 24 hrs: Vitals:   09/28/21 1858 09/28/21 2012 09/29/21 0500 09/29/21 0620  BP: 135/86 125/79  129/81  Pulse: 84 84  66  Resp: 16 18  18   Temp: 98.7 F (37.1 C) 98.5 F (36.9 C)  98.1 F (36.7 C)  TempSrc: Oral Oral  Oral  SpO2: 100% 98%  99%  Weight:   61 kg   Height:       Weight change: -0.3 kg  Intake/Output Summary (Last 24 hours) at 09/29/2021 1107 Last data filed at 09/28/2021 1901 Gross per 24 hour  Intake 120 ml  Output 800 ml  Net -680 ml   Net IO Since Admission: 1,868.58 mL [09/29/21 1107]   Physical Examination: General exam: AA oriented at baseline, pleasant,, weak,older than stated age. HEENT:Oral mucosa moist, Ear/Nose WNL grossly,dentition normal. Respiratory system: B/l clear BS, no use of accessory muscle, non tender. Cardiovascular system: S1 & S2 +,No JVD. Gastrointestinal system: Abdomen soft, NT,ND, BS+. Nervous System:Alert, awake,  moving extremities. Extremities: edema none, distal peripheral pulses palpable.  Skin: No rashes, no icterus. MSK: Normal muscle bulk, tone, power.  Medications reviewed:  Scheduled Meds:  donepezil  10 mg Oral Daily   levothyroxine  75 mcg Oral q morning   memantine  10 mg Oral BID   Continuous Infusions:  pantoprazole 8 mg/hr (09/29/21 0756)    Diet Order             Diet NPO time specified  Diet effective 0500                 Weight change: -0.3 kg  Wt Readings from Last 3 Encounters:  09/29/21 61 kg  09/24/21 63.5 kg  07/16/21 60.6 kg     Consultants:see note  Procedures:see note Antimicrobials: Anti-infectives (From admission, onward)    None      Culture/Microbiology    Component Value Date/Time   SDES  09/27/2021 0908    URINE, CLEAN CATCH Performed at [facility], [place] [address], [place], [place] [address]    SPECREQUEST  09/27/2021 0908    NONE Performed at [facility], [place] [address], [place], [place] [address]    CULT (A) 09/27/2021 0908    30,000 COLONIES/mL STREPTOCOCCUS AGALACTIAE TESTING AGAINST S. AGALACTIAE NOT ROUTINELY PERFORMED DUE TO PREDICTABILITY OF AMP/PEN/VAN SUSCEPTIBILITY. Performed at [facility], [place] [address] [place], [place] [address]    REPTSTATUS 09/28/2021 FINAL 09/27/2021 0908    Other culture-see note  Unresulted Labs (From admission, onward)     Start     Ordered   09/28/21 0500  CBC with Differential/Platelet  Daily,   R      09/27/21 1251   09/28/21 5638  Basic metabolic panel  Daily,   R      09/27/21 1251          Data Reviewed: I have personally reviewed following labs and imaging studies CBC: Recent Labs  Lab 09/24/21 2050 09/26/21 1748 09/27/21 0030 09/27/21 0525 09/27/21 1237 09/27/21 2014 09/28/21 0523 09/29/21 0539  WBC 6.6 6.2  --   --   --   --  5.3 5.3  NEUTROABS 4.2 4.3  --   --   --   --  3.3 3.0  HGB 9.2* 8.3*   < > 7.3* 7.7* 7.6*  7.8* 8.5*  HCT 29.3* 27.1*   < > 23.3* 24.9* 24.4* 24.7* 26.2*  MCV 73.4* 75.1*  --   --   --   --  74.6* 73.4*  PLT 179 156  --   --   --   --  140* 143*   < > = values in this interval not displayed.   Basic Metabolic Panel: Recent Labs  Lab 09/24/21 2050 09/26/21 1748 09/27/21 0525 09/28/21 0523 09/29/21 0539  NA 144 146* 147* 142 141  K 3.6 3.6 3.3* 3.7 3.7  CL 111 114* 116* 112* 110  CO2 23 26 25 26  21*  GLUCOSE 114* 120* 93 91 72  BUN 39* 39* 31* 19 23  CREATININE 1.66* 1.69* 1.62* 1.35*  1.62*  CALCIUM 10.4* 9.8 9.1 9.1 9.4  MG  --   --  2.2  --   --    GFR: Estimated Creatinine Clearance: 23 mL/min (A) (by C-G formula based on SCr of 1.62 mg/dL (H)). Liver Function Tests: Recent Labs  Lab 09/24/21 2050 09/26/21 1748 09/27/21 0525  AST 17 16 13*  ALT 10 11 10   ALKPHOS 104 88 78  BILITOT 0.8 0.7 0.7  PROT 9.2* 8.0 7.1  ALBUMIN 4.4 3.8 3.4*   No results for input(s): LIPASE, AMYLASE in the last 168 hours. No results for input(s): AMMONIA in the last 168 hours. Coagulation Profile: No results for input(s): INR, PROTIME in the last 168 hours. Cardiac Enzymes: No results for input(s): CKTOTAL, CKMB, CKMBINDEX, TROPONINI in the last 168 hours. BNP (last 3 results) No results for input(s): PROBNP in the last 8760 hours. HbA1C: No results for input(s): HGBA1C in the last 72 hours. CBG: No results for input(s): GLUCAP in the last 168 hours. Lipid Profile: No results for input(s): CHOL, HDL, LDLCALC, TRIG, CHOLHDL, LDLDIRECT in the last 72 hours. Thyroid Function Tests: Recent Labs    09/26/21 1755 09/26/21 2029  TSH 21.262*  --   FREET4  --  0.76   Anemia Panel: No results for input(s): VITAMINB12, FOLATE, FERRITIN, TIBC, IRON, RETICCTPCT in the last 72 hours. Sepsis Labs: No results for input(s): PROCALCITON, LATICACIDVEN in the last 168 hours.  Recent Results (from the past 240 hour(s))  Resp Panel by RT-PCR (Flu A&B, Covid) Nasopharyngeal Swab      Status: None   Collection Time: 09/26/21  7:12 PM   Specimen: Nasopharyngeal Swab; Nasopharyngeal(NP) swabs in vial transport medium  Result Value Ref Range Status   SARS Coronavirus 2 by RT PCR NEGATIVE NEGATIVE Final    Comment: (NOTE) SARS-CoV-2 target nucleic acids are NOT DETECTED.  The SARS-CoV-2 RNA is generally detectable in upper respiratory specimens during the acute phase of infection. The lowest concentration of SARS-CoV-2 viral copies this assay can detect is 138 copies/mL. A negative result does not preclude SARS-Cov-2 infection and should not be used as the sole basis for treatment or other patient management decisions. A negative result may occur with  improper specimen collection/handling, submission of specimen other than nasopharyngeal swab, presence of viral mutation(s) within the areas targeted by this assay, and inadequate number of viral copies(<138 copies/mL). A negative result must be combined with clinical observations, patient history, and epidemiological information. The expected result is Negative.  Fact Sheet for Patients:  EntrepreneurPulse.com.au  Fact Sheet for Healthcare Providers:  IncredibleEmployment.be  This test is no t yet approved or cleared by the [place] FDA and  has been authorized for detection and/or diagnosis of SARS-CoV-2 by FDA under an Emergency Use Authorization (EUA). This EUA will remain  in effect (meaning this test can be used) for the duration of the COVID-19 declaration under Section 564(b)(1) of the Act, 21 U.S.C.section 360bbb-3(b)(1), unless the authorization is terminated  or revoked sooner.       Influenza A by PCR NEGATIVE NEGATIVE Final   Influenza B by PCR NEGATIVE NEGATIVE Final    Comment: (NOTE) The Xpert Xpress SARS-CoV-2/FLU/RSV plus assay is intended as an aid in the diagnosis of influenza from Nasopharyngeal swab specimens and should not be used as a sole basis  for treatment. Nasal washings and aspirates are unacceptable for Xpert Xpress SARS-CoV-2/FLU/RSV testing.  Fact Sheet for Patients: EntrepreneurPulse.com.au  Fact Sheet for Healthcare Providers: IncredibleEmployment.be  This test  is not yet approved or cleared by the [place] and has been authorized for detection and/or diagnosis of SARS-CoV-2 by FDA under an Emergency Use Authorization (EUA). This EUA will remain in effect (meaning this test can be used) for the duration of the COVID-19 declaration under Section 564(b)(1) of the Act, 21 U.S.C. section 360bbb-3(b)(1), unless the authorization is terminated or revoked.  Performed at [facility], [place] [address] [place], [place] [address]   Urine Culture     Status: Abnormal   Collection Time: 09/27/21  9:08 AM   Specimen: Urine, Clean Catch  Result Value Ref Range Status   Specimen Description   Final    URINE, CLEAN CATCH Performed at [facility], [place] [address], [place], [place] [address]    Special Requests   Final    NONE Performed at [facility], [place] [address], [place], [place] [address]    Culture (A)  Final    30,000 COLONIES/mL STREPTOCOCCUS AGALACTIAE TESTING AGAINST S. AGALACTIAE NOT ROUTINELY PERFORMED DUE TO PREDICTABILITY OF AMP/PEN/VAN SUSCEPTIBILITY. Performed at [facility], [place] [address] [place], [place] [address]    Report Status 09/28/2021 FINAL  Final     Radiology Studies: No results found.   LOS: 2 days   Antonieta Pert, MD [facility]  09/29/2021, 11:07 AM

## 2021-09-29 NOTE — TOC Initial Note (Signed)
Transition of Care [facility]) - Initial/Assessment Note    Patient Details  Name: Susan [name] MRN: [id number] Date of Birth: [birth year]  Transition of Care [facility]) CM/SW Contact:    Lynnell Catalan, RN Phone Number: 09/29/2021, 1:35 PM  Clinical Narrative:                   Transition of Care (TOC) Screening Note   Patient Details  Name: Susan [name] Date of Birth: 1940/10/15   Transition of Care [facility]) CM/SW Contact:    Lynnell Catalan, RN Phone Number: 09/29/2021, 1:36 PM    Transition of Care Department (TOC) has reviewed patient and no TOC needs have been identified at this time. We will continue to monitor patient advancement through interdisciplinary progression rounds. If new patient transition needs arise, please place a TOC consult.         Admission diagnosis:  GI bleeding [K92.2] GI bleed [K92.2] Patient Active Problem List   Diagnosis Date Noted   GI bleed 09/27/2021   GI bleeding 09/26/2021   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   Gastritis without bleeding    Melena    Thrombocytopenia ([place]) 05/03/2021   Allergic rhinitis 03/13/2021   Acute on chronic blood loss anemia 01/08/2021   Cerebral vascular disease 06/09/2020   Pain due to onychomycosis of toenails of both feet 04/08/2020   Porokeratosis 04/08/2020   Dementia without behavioral disturbance ([place]) 02/27/2020   Late onset Alzheimer's disease without behavioral disturbance ([place]) 06/12/2019   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 [place] [address] Phone: [phone] Fax: [phone]     Readmission Risk Interventions Readmission Risk Prevention Plan 09/29/2021  Transportation Screening Complete  PCP or Specialist Appt within 5-7 Days Complete  Home Care Screening Complete  Medication Review (RN CM) Complete  Some recent data might be hidden

## 2021-09-29 NOTE — Op Note (Signed)
[facility] Patient Name: Susan [name] Procedure Date: 09/29/2021 MRN: [id number] Attending MD: Mauri Pole , MD Date of Birth: Oct 31, 1940 CSN: [id number] Age: 80 Admit Type: Inpatient Procedure:                Colonoscopy Indications:              Evaluation of unexplained GI bleeding presenting                            with Melena (upper GI source has been excluded),                            Evaluation of unexplained GI bleeding presenting                            with fecal occult blood Providers:                Mauri Pole, MD, Jeanella Cara, RN,                            Luan Moore, Technician, [facility],                            CRNA Referring MD:              Medicines:                Monitored Anesthesia Care Complications:            No immediate complications. Estimated Blood Loss:     Estimated blood loss was minimal. Procedure:                Pre-Anesthesia Assessment:                           - Prior to the procedure, a History and Physical                            was performed, and patient medications and                            allergies were reviewed. The patient's tolerance of                            previous anesthesia was also reviewed. The risks                            and benefits of the procedure and the sedation                            options and risks were discussed with the patient.                            All questions were answered, and informed consent                            was obtained. Prior  Anticoagulants: The patient has                            taken no previous anticoagulant or antiplatelet                            agents. ASA Grade Assessment: III - A patient with                            severe systemic disease. After reviewing the risks                            and benefits, the patient was deemed in                            satisfactory condition  to undergo the procedure.                           After obtaining informed consent, the colonoscope                            was passed under direct vision. Throughout the                            procedure, the patient's blood pressure, pulse, and                            oxygen saturations were monitored continuously. The                            [id number]-HQ190L ([id number] Olympus colonoscope was                            introduced through the anus and advanced to the the                            cecum, identified by appendiceal orifice and                            ileocecal valve. The colonoscopy was performed                            without difficulty. The patient tolerated the                            procedure well. The quality of the bowel                            preparation was poor. The ileocecal valve,                            appendiceal orifice, and rectum were photographed. Scope In: 1:15:25 PM Scope Out: 1:37:02 PM Scope Withdrawal Time: 0 hours 9 minutes 18 seconds  Total Procedure Duration: 0 hours 21 minutes  37 seconds  Findings:      The perianal and digital rectal examinations were normal.      An infiltrative and ulcerated partially obstructing large mass was found       in the ascending colon. The mass was non-circumferential. The mass       measured seven cm in length. No bleeding was present. Biopsies were       taken with a cold forceps for histology. Area was tattooed with an       injection of 2.5 mL of Spot (carbon black).      Multiple small and large-mouthed diverticula were found in the sigmoid       colon, descending colon, transverse colon and ascending colon.      Non-bleeding internal hemorrhoids were found during retroflexion. The       hemorrhoids were medium-sized. Impression:               - Preparation of the colon was poor.                           - Rule out malignancy, partially obstructing tumor                             in the ascending colon. Biopsied. Tattooed.                           - Moderate diverticulosis in the sigmoid colon, in                            the descending colon, in the transverse colon and                            in the ascending colon.                           - Non-bleeding internal hemorrhoids. Moderate Sedation:      Not Applicable - Patient had care per Anesthesia. Recommendation:           - Patient has a contact number available for                            emergencies. The signs and symptoms of potential                            delayed complications were discussed with the                            patient. Return to normal activities tomorrow.                            Written discharge instructions were provided to the                            patient.                           - Resume previous diet.                           -  Continue present medications.                           - Await pathology results.                           - Repeat colonoscopy date to be determined after                            pending pathology results are reviewed for                            surveillance based on pathology results. Procedure Code(s):        --- Professional ---                           [phone], Colonoscopy, flexible; with directed                            submucosal injection(s), any substance                           [id number], Colonoscopy, flexible; with biopsy, single                            or multiple Diagnosis Code(s):        --- Professional ---                           K64.8, Other hemorrhoids                           D49.0, Neoplasm of unspecified behavior of                            digestive system                           K56.690, Other partial intestinal obstruction                           K92.1, Melena (includes Hematochezia)                           R19.5, Other fecal abnormalities                           K57.30,  Diverticulosis of large intestine without                            perforation or abscess without bleeding CPT copyright 2019 American Medical Association. All rights reserved. The codes documented in this report are preliminary and upon coder review may  be revised to meet current compliance requirements. Mauri Pole, MD 09/29/2021 1:54:42 PM This report has been signed electronically. Number of Addenda: 0

## 2021-09-29 NOTE — Interval H&P Note (Signed)
History and Physical Interval Note:  09/29/2021 11:59 AM  Susan [name]  has presented today for surgery, with the diagnosis of Hx AVMs, Anemia, Dark stools.  The various methods of treatment have been discussed with the patient and family. After consideration of risks, benefits and other options for treatment, the patient has consented to  Procedure(s): ENTEROSCOPY (N/A) COLONOSCOPY (N/A) as a surgical intervention.  The patient's history has been reviewed, patient examined, no change in status, stable for surgery.  I have reviewed the patient's chart and labs.  Questions were answered to the patient's satisfaction.     Whisper Kurka

## 2021-09-29 NOTE — Anesthesia Preprocedure Evaluation (Addendum)
Anesthesia Evaluation  Patient identified by MRN, date of birth, ID band Patient awake    Reviewed: Allergy & Precautions, NPO status , Patient's Chart, lab work & pertinent test results, reviewed documented beta blocker date and time   Airway Mallampati: I  TM Distance: >3 FB Neck ROM: Full    Dental no notable dental hx. (+) Edentulous Upper, Edentulous Lower   Pulmonary former smoker,    Pulmonary exam normal breath sounds clear to auscultation       Cardiovascular hypertension, Pt. on medications and Pt. on home beta blockers Normal cardiovascular exam Rhythm:Regular Rate:Normal  EKG 05/03/21 NSR, prolonged PR interval  Echo 06/24/20 1. Left ventricular ejection fraction, by estimation, is 60 to 65%. The left ventricle has normal function. The left ventricle has no regional wall motion abnormalities. Left ventricular diastolic parameters were normal.  2. Right ventricular systolic function is normal. The right ventricular size is normal. Tricuspid regurgitation signal is inadequate for assessing PA pressure.  3. The mitral valve is normal in structure. Trivial mitral valve regurgitation. No evidence of mitral stenosis.  4. The aortic valve is normal in structure. Aortic valve regurgitation is trivial. No aortic stenosis is present.  5. The inferior vena cava is dilated in size with <50% respiratory variability, suggesting right atrial pressure of 15 mmHg.    Neuro/Psych PSYCHIATRIC DISORDERS Dementia Memory lossnegative neurological ROS     GI/Hepatic Neg liver ROS, GERD  Medicated,Hx/o recurrent GI Bleeds due to GI AVM's Melena    Endo/Other  Hypothyroidism   Renal/GU Renal InsufficiencyRenal disease              Musculoskeletal  (+) Arthritis , Osteoarthritis,    Abdominal   Peds  Hematology  (+) anemia , Hx/o Myelodysplasia      Thrombocytopenia- mild   Anesthesia Other Findings    Reproductive/Obstetrics                            Anesthesia Physical  Anesthesia Plan  ASA: 3 and emergent  Anesthesia Plan: MAC   Post-op Pain Management:    Induction:   PONV Risk Score and Plan: Treatment may vary due to age or medical condition  Airway Management Planned: Natural Airway  Additional Equipment: None  Intra-op Plan:   Post-operative Plan:   Informed Consent: I have reviewed the patients History and Physical, chart, labs and discussed the procedure including the risks, benefits and alternatives for the proposed anesthesia with the patient or authorized representative who has indicated his/her understanding and acceptance.     Dental advisory given  Plan Discussed with: CRNA and Anesthesiologist  Anesthesia Plan Comments:         Anesthesia Quick Evaluation

## 2021-09-29 NOTE — Anesthesia Postprocedure Evaluation (Signed)
Anesthesia Post Note  Patient: Susan [name]  Procedure(s) Performed: ENTEROSCOPY COLONOSCOPY BIOPSY SUBMUCOSAL TATTOO INJECTION     Patient location during evaluation: Endoscopy Anesthesia Type: MAC Level of consciousness: awake Pain management: pain level controlled Vital Signs Assessment: post-procedure vital signs reviewed and stable Respiratory status: spontaneous breathing, nonlabored ventilation, respiratory function stable and patient connected to nasal cannula oxygen Cardiovascular status: stable and blood pressure returned to baseline Postop Assessment: no apparent nausea or vomiting Anesthetic complications: no   No notable events documented.  Last Vitals:  Vitals:   09/29/21 1410 09/29/21 1735  BP: 108/76 110/82  Pulse: (!) 103 (!) 102  Resp: 18 15  Temp:  36.8 C  SpO2: 96% 99%    Last Pain:  Vitals:   09/29/21 1735  TempSrc: Oral  PainSc:                  Susan [name]

## 2021-09-30 ENCOUNTER — Other Ambulatory Visit: Payer: Self-pay | Admitting: *Deleted

## 2021-09-30 DIAGNOSIS — D62 Acute posthemorrhagic anemia: Secondary | ICD-10-CM | POA: Diagnosis not present

## 2021-09-30 DIAGNOSIS — C182 Malignant neoplasm of ascending colon: Secondary | ICD-10-CM | POA: Diagnosis not present

## 2021-09-30 DIAGNOSIS — Z01818 Encounter for other preprocedural examination: Secondary | ICD-10-CM | POA: Diagnosis not present

## 2021-09-30 DIAGNOSIS — D509 Iron deficiency anemia, unspecified: Secondary | ICD-10-CM | POA: Diagnosis present

## 2021-09-30 DIAGNOSIS — G5602 Carpal tunnel syndrome, left upper limb: Secondary | ICD-10-CM | POA: Insufficient documentation

## 2021-09-30 DIAGNOSIS — K921 Melena: Secondary | ICD-10-CM | POA: Diagnosis not present

## 2021-09-30 LAB — CBC WITH DIFFERENTIAL/PLATELET
Abs Immature Granulocytes: 0.05 10*3/uL (ref 0.00–0.07)
Basophils Absolute: 0 10*3/uL (ref 0.0–0.1)
Basophils Relative: 0 %
Eosinophils Absolute: 0 10*3/uL (ref 0.0–0.5)
Eosinophils Relative: 0 %
HCT: 24 % — ABNORMAL LOW (ref 36.0–46.0)
Hemoglobin: 7.7 g/dL — ABNORMAL LOW (ref 12.0–15.0)
Immature Granulocytes: 1 %
Lymphocytes Relative: 19 %
Lymphs Abs: 1.8 10*3/uL (ref 0.7–4.0)
MCH: 23.4 pg — ABNORMAL LOW (ref 26.0–34.0)
MCHC: 32.1 g/dL (ref 30.0–36.0)
MCV: 72.9 fL — ABNORMAL LOW (ref 80.0–100.0)
Monocytes Absolute: 0.7 10*3/uL (ref 0.1–1.0)
Monocytes Relative: 8 %
Neutro Abs: 6.5 10*3/uL (ref 1.7–7.7)
Neutrophils Relative %: 72 %
Platelets: 132 10*3/uL — ABNORMAL LOW (ref 150–400)
RBC: 3.29 MIL/uL — ABNORMAL LOW (ref 3.87–5.11)
RDW: 24 % — ABNORMAL HIGH (ref 11.5–15.5)
WBC: 9 10*3/uL (ref 4.0–10.5)
nRBC: 0 % (ref 0.0–0.2)

## 2021-09-30 LAB — HEMOGLOBIN A1C
Hgb A1c MFr Bld: 4.9 % (ref 4.8–5.6)
Mean Plasma Glucose: 93.93 mg/dL

## 2021-09-30 LAB — SURGICAL PATHOLOGY

## 2021-09-30 LAB — PHOSPHORUS: Phosphorus: 2.5 mg/dL (ref 2.5–4.6)

## 2021-09-30 LAB — BASIC METABOLIC PANEL
Anion gap: 8 (ref 5–15)
BUN: 29 mg/dL — ABNORMAL HIGH (ref 8–23)
CO2: 24 mmol/L (ref 22–32)
Calcium: 8.7 mg/dL — ABNORMAL LOW (ref 8.9–10.3)
Chloride: 109 mmol/L (ref 98–111)
Creatinine, Ser: 2.15 mg/dL — ABNORMAL HIGH (ref 0.44–1.00)
GFR, Estimated: 22 mL/min — ABNORMAL LOW (ref >60)
Glucose, Bld: 114 mg/dL — ABNORMAL HIGH (ref 70–99)
Potassium: 3.5 mmol/L (ref 3.5–5.1)
Sodium: 141 mmol/L (ref 135–145)

## 2021-09-30 LAB — PREALBUMIN: Prealbumin: 13.6 mg/dL — ABNORMAL LOW (ref 18–38)

## 2021-09-30 MED ORDER — CLINDAMYCIN PHOSPHATE 900 MG/50ML IV SOLN
900.0000 mg | INTRAVENOUS | Status: AC
  Administered 2021-10-01: 10:00:00 900 mg via INTRAVENOUS
  Filled 2021-09-30: qty 50

## 2021-09-30 MED ORDER — GENTAMICIN SULFATE 40 MG/ML IJ SOLN
5.0000 mg/kg | INTRAVENOUS | Status: AC
  Administered 2021-10-01: 10:00:00 310 mg via INTRAVENOUS
  Filled 2021-09-30: qty 7.75

## 2021-09-30 MED ORDER — PANTOPRAZOLE SODIUM 40 MG PO TBEC
40.0000 mg | DELAYED_RELEASE_TABLET | Freq: Two times a day (BID) | ORAL | Status: DC
  Administered 2021-09-30 – 2021-10-08 (×15): 40 mg via ORAL
  Filled 2021-09-30 (×15): qty 1

## 2021-09-30 MED ORDER — POTASSIUM CHLORIDE CRYS ER 20 MEQ PO TBCR
20.0000 meq | EXTENDED_RELEASE_TABLET | Freq: Once | ORAL | Status: AC
  Administered 2021-09-30: 13:00:00 20 meq via ORAL
  Filled 2021-09-30: qty 1

## 2021-09-30 MED ORDER — GABAPENTIN 300 MG PO CAPS
300.0000 mg | ORAL_CAPSULE | ORAL | Status: AC
  Administered 2021-10-01: 300 mg via ORAL
  Filled 2021-09-30: qty 1

## 2021-09-30 MED ORDER — ENSURE PRE-SURGERY PO LIQD
592.0000 mL | Freq: Once | ORAL | Status: AC
  Administered 2021-09-30: 17:00:00 592 mL via ORAL
  Filled 2021-09-30: qty 592

## 2021-09-30 MED ORDER — ENOXAPARIN SODIUM 40 MG/0.4ML IJ SOSY
40.0000 mg | PREFILLED_SYRINGE | Freq: Once | INTRAMUSCULAR | Status: AC
  Administered 2021-10-01: 09:00:00 40 mg via SUBCUTANEOUS
  Filled 2021-09-30: qty 0.4

## 2021-09-30 MED ORDER — POLYETHYLENE GLYCOL 3350 17 G PO PACK
34.0000 g | PACK | Freq: Two times a day (BID) | ORAL | Status: DC
  Administered 2021-09-30: 17:00:00 34 g via ORAL
  Filled 2021-09-30: qty 2

## 2021-09-30 MED ORDER — NEOMYCIN SULFATE 500 MG PO TABS: 1000.0000 mg | ORAL_TABLET | ORAL | Status: DC

## 2021-09-30 MED ORDER — LACTATED RINGERS IV SOLN
INTRAVENOUS | Status: DC
Start: 2021-09-30 — End: 2021-10-02

## 2021-09-30 MED ORDER — ALVIMOPAN 12 MG PO CAPS
12.0000 mg | ORAL_CAPSULE | ORAL | Status: AC
  Administered 2021-10-01: 09:00:00 12 mg via ORAL
  Filled 2021-09-30: qty 1

## 2021-09-30 MED ORDER — ENSURE PRE-SURGERY PO LIQD
296.0000 mL | Freq: Once | ORAL | Status: AC
  Administered 2021-10-01: 07:00:00 296 mL via ORAL
  Filled 2021-09-30: qty 296

## 2021-09-30 MED ORDER — ENSURE SURGERY PO LIQD
237.0000 mL | Freq: Two times a day (BID) | ORAL | Status: DC
  Filled 2021-09-30 (×2): qty 237

## 2021-09-30 MED ORDER — LACTATED RINGERS IV BOLUS: 1000.0000 mL | Freq: Three times a day (TID) | INTRAVENOUS | Status: AC | PRN

## 2021-09-30 MED ORDER — BUPIVACAINE LIPOSOME 1.3 % IJ SUSP
20.0000 mL | INTRAMUSCULAR | Status: AC
  Administered 2021-10-01: 12:00:00 20 mL

## 2021-09-30 MED ORDER — POLYETHYLENE GLYCOL 3350 17 GM/SCOOP PO POWD: 1.0000 | Freq: Once | ORAL | Status: DC

## 2021-09-30 MED ORDER — PANTOPRAZOLE INFUSION (NEW) - SIMPLE MED
8.0000 mg/h | INTRAVENOUS | Status: DC
  Administered 2021-09-30: 03:00:00 8 mg/h via INTRAVENOUS
  Filled 2021-09-30: qty 80

## 2021-09-30 MED ORDER — ACETAMINOPHEN 500 MG PO TABS
1000.0000 mg | ORAL_TABLET | ORAL | Status: AC
  Administered 2021-10-01: 1000 mg via ORAL
  Filled 2021-09-30: qty 2

## 2021-09-30 MED ORDER — BISACODYL 5 MG PO TBEC
20.0000 mg | DELAYED_RELEASE_TABLET | Freq: Once | ORAL | Status: AC
  Administered 2021-09-30: 17:00:00 20 mg via ORAL
  Filled 2021-09-30: qty 4

## 2021-09-30 MED ORDER — LIP MEDEX EX OINT
TOPICAL_OINTMENT | CUTANEOUS | Status: DC | PRN
  Filled 2021-09-30 (×2): qty 7

## 2021-09-30 MED ORDER — LACTATED RINGERS IV BOLUS
1000.0000 mL | Freq: Once | INTRAVENOUS | Status: AC
  Administered 2021-09-30: 13:00:00 1000 mL via INTRAVENOUS

## 2021-09-30 NOTE — Care Management Important Message (Signed)
Important Message  Patient Details IM Letter placed in Patients room. Name: Susan [name] MRN: [id number] Date of Birth: 11-07-1940   Medicare Important Message Given:  Yes     Kerin Salen 09/30/2021, 10:25 AM

## 2021-09-30 NOTE — Consult Note (Signed)
Cardiology Consultation:   Patient ID: Susan [name] MRN: [id number]; DOB: [birth year]  Admit date: 09/26/2021 Date of Consult: 09/30/2021  PCP:  Sonia Side., [name]   [facility] Providers Cardiologist:  New, Sareena Odeh  Click here to update MD or APP on Care Team, Refresh:1}     Patient Profile:   Susan [name] is a 80 y.o. female with a hx of HTN  who is being seen 09/30/2021 for the evaluation of pre op evaluation  prior to colon cancer surgery at the request of Margie Billet, [place] .  History of Present Illness:   Ms. Susan [name] is an 80 yo with hx of HTN, HLD. She was diagnosed with colon cancer yesterday and is scheduled for robotic assisted Right hemicolectomy tomorrow.  She has no hx of CAD She has HTN that has been well controlled.  Echo from Sept. 2021 shows normal LV systlic and diastolic function .  She has trivial MR, trivial AI  No hx of angina, DOE, syncope, presyncope    Past Medical History:  Diagnosis Date   Anemia    Arthritis    Blood transfusion without reported diagnosis    GERD (gastroesophageal reflux disease)    GI bleed    HLD (hyperlipidemia)    Hypertension    Hypothyroidism    Memory loss    Myelodysplasia    Vitamin D deficiency     Past Surgical History:  Procedure Laterality Date   BIOPSY  01/09/2021   Procedure: BIOPSY;  Surgeon: Milus Banister, MD;  Location: [facility];  Service: Endoscopy;;   BIOPSY  05/04/2021   Procedure: BIOPSY;  Surgeon: Ladene Artist, MD;  Location: [facility];  Service: Endoscopy;;   BIOPSY  09/29/2021   Procedure: BIOPSY;  Surgeon: Mauri Pole, MD;  Location: [facility];  Service: Endoscopy;;   CARPAL TUNNEL RELEASE Right    COLONOSCOPY N/A 09/29/2021   Procedure: COLONOSCOPY;  Surgeon: Mauri Pole, MD;  Location: [facility];  Service: Endoscopy;  Laterality: N/A;   DILATION AND CURETTAGE OF UTERUS     ENTEROSCOPY N/A 05/04/2021   Procedure: ENTEROSCOPY;  Surgeon: Ladene Artist,  MD;  Location: [facility];  Service: Endoscopy;  Laterality: N/A;   ENTEROSCOPY N/A 09/29/2021   Procedure: ENTEROSCOPY;  Surgeon: Mauri Pole, MD;  Location: [facility];  Service: Endoscopy;  Laterality: N/A;   ESOPHAGOGASTRODUODENOSCOPY (EGD) WITH PROPOFOL N/A 01/09/2021   Procedure: ESOPHAGOGASTRODUODENOSCOPY (EGD) WITH PROPOFOL;  Surgeon: Milus Banister, MD;  Location: [facility];  Service: Endoscopy;  Laterality: N/A;   HOT HEMOSTASIS N/A 05/04/2021   Procedure: HOT HEMOSTASIS (ARGON PLASMA COAGULATION/BICAP);  Surgeon: Ladene Artist, MD;  Location: [facility];  Service: Endoscopy;  Laterality: N/A;   INGUINAL HERNIA REPAIR     IR ANGIOGRAM FOLLOW UP STUDY  03/13/2021   IR ANGIOGRAM VISCERAL SELECTIVE  03/13/2021   IR EMBO ART  VEN HEMORR LYMPH EXTRAV  INC GUIDE ROADMAPPING  03/13/2021   IR US GUIDE VASC ACCESS RIGHT  03/13/2021   ROTATOR CUFF REPAIR Left    SUBMUCOSAL TATTOO INJECTION  05/04/2021   Procedure: SUBMUCOSAL TATTOO INJECTION;  Surgeon: Ladene Artist, MD;  Location: [facility];  Service: Endoscopy;;   SUBMUCOSAL TATTOO INJECTION  09/29/2021   Procedure: SUBMUCOSAL TATTOO INJECTION;  Surgeon: Mauri Pole, MD;  Location: [facility];  Service: Endoscopy;;   TRANSANAL HEMORRHOIDAL DEARTERIALIZATION N/A 03/31/2017   Procedure: TRANSANAL HEMORRHOIDAL DEARTERIALIZATION;  Surgeon: Leighton Ruff, MD;  Location: [facility];  Service: General;  Laterality: N/A;   UMBILICAL HERNIA REPAIR       Home Medications:  Prior to Admission medications   Medication Sig Start Date End Date Taking? Authorizing Provider  acetaminophen (TYLENOL) 500 MG tablet Take 500 mg by mouth every 6 (six) hours as needed for moderate pain (for pain.).   Yes [provider]  allopurinol (ZYLOPRIM) 300 MG tablet Take 300 mg by mouth daily. 07/20/20  Yes [provider]  amLODipine (NORVASC) 10 MG tablet TAKE 1/2 TABLET BY MOUTH DAILY AT BEDTIME; (TAKE 1 TABLET IF SBP IS  >150) Patient taking differently: Take 10 mg by mouth daily. 07/20/17  Yes [name], NP  donepezil (ARICEPT) 10 MG tablet Take 10 mg by mouth daily. 07/20/20  Yes [provider]  famotidine (PEPCID) 20 MG tablet Take 20 mg by mouth 2 (two) times daily. 03/08/21  Yes [provider]  FEROSUL 325 (65 Fe) MG tablet Take 325 mg by mouth daily. 09/15/21  Yes [provider]  labetalol (NORMODYNE) 200 MG tablet Take 200 mg by mouth daily. 06/23/20  Yes [provider]  levothyroxine (SYNTHROID) 75 MCG tablet Take 75 mcg by mouth every morning. 12/18/20  Yes [provider]  memantine (NAMENDA) 10 MG tablet Take 1 tablet (10 mg total) by mouth 2 (two) times daily. 11/30/20  Yes [name], NP  mirtazapine (REMERON) 15 MG tablet Take 15 mg by mouth at bedtime. 07/20/20  Yes [provider]  montelukast (SINGULAIR) 10 MG tablet Take 10 mg by mouth at bedtime.   Yes [provider]  pantoprazole (PROTONIX) 40 MG tablet Take 1 tablet (40 mg total) by mouth daily. 05/06/21  Yes [name], MD  predniSONE (DELTASONE) 5 MG tablet Take 5 mg by mouth as directed.   Yes [provider]  traMADol (ULTRAM) 50 MG tablet Take 50 mg by mouth every 6 (six) hours as needed for moderate pain.   Yes [provider]  aspirin 81 MG chewable tablet Chew 81 mg by mouth daily.    [provider]  benzonatate (TESSALON) 100 MG capsule Take 1 capsule (100 mg total) by mouth every 8 (eight) hours. Patient not taking: Reported on 09/26/2021 06/21/21   Raspet, Derry Skill, PA-C    Inpatient Medications: Scheduled Meds:  donepezil  10 mg Oral Daily   feeding supplement  237 mL Oral BID BM   levothyroxine  75 mcg Oral q morning   memantine  10 mg Oral BID   pantoprazole  40 mg Oral BID   Continuous Infusions:  lactated ringers     lactated ringers     PRN Meds: acetaminophen **OR** acetaminophen, lactated ringers, ondansetron  **OR** ondansetron (ZOFRAN) IV  Allergies:    Allergies  Allergen Reactions   Codeine Anaphylaxis   Penicillins Anaphylaxis   Bactrim Itching and Swelling   Clarithromycin Other (See Comments)    "Caused skin to peel off my hand."   Flagyl [Metronidazole Hcl] Itching and Swelling   Food Other (See Comments)    EATS WHITE RICE ONLY (ALLERGIC REACTION TO YELLOW OR BROWN RICE THAT RESULTED IN HOSPITALIZATION)   Pineapple Itching and Swelling    SWELLING OF EYES   Sulfa Antibiotics Itching and Swelling    Social History:   Social History   Socioeconomic History   Marital status: Single    Spouse name: Not on file   Number of children: 1   Years of education: college   Highest  education level: Bachelor's degree (e.g., BA, AB, BS)  Occupational History   Occupation: retired    Fish farm manager: RETIRED  Tobacco Use   Smoking status: Former    Types: Cigarettes    Quit date: 10/02/1996    Years since quitting: 25.0   Smokeless tobacco: Never  Vaping Use   Vaping Use: Never used  Substance and Sexual Activity   Alcohol use: Yes    Comment: glass of wine at least once a month.   Drug use: No   Sexual activity: Not Currently    Partners: Male  Other Topics Concern   Not on file  Social History Narrative   Lives at home alone.   Right-handed.   No daily use of caffeine.   Social Determinants of Health   Financial Resource Strain: Not on file  Food Insecurity: Not on file  Transportation Needs: Not on file  Physical Activity: Not on file  Stress: Not on file  Social Connections: Not on file  Intimate Partner Violence: Not on file    Family History:    Family History  Problem Relation Age of Onset   Stroke Father    Hypertension Father    Other Mother        died at early age   Stroke Sister    Coronary artery disease Brother    Hypertension Sister    Bladder Cancer Sister    Colon cancer Neg Hx    Stomach cancer Neg Hx      ROS:  Please see the history of  present illness.   All other ROS reviewed and negative.     Physical Exam/Data:   Vitals:   09/29/21 2000 09/29/21 2006 09/30/21 0511 09/30/21 1333  BP:  128/77 110/71 127/72  Pulse:  (!) 104 77 83  Resp: (!) 24 19 19 15   Temp:  99.2 F (37.3 C) 97.7 F (36.5 C) 98 F (36.7 C)  TempSrc:  Oral Oral Oral  SpO2:  99% 99% 100%  Weight:      Height:        Intake/Output Summary (Last 24 hours) at 09/30/2021 1354 Last data filed at 09/30/2021 1246 Gross per 24 hour  Intake 744.33 ml  Output --  Net 744.33 ml   Last 3 Weights 09/29/2021 09/29/2021 09/28/2021  Weight (lbs) 134 lb 7.7 oz 134 lb 7.7 oz 135 lb 2.3 oz  Weight (kg) 61 kg 61 kg 61.3 kg     Body mass index is 24.6 kg/m.  General:  elderly female,  NAD  HEENT: normal Neck: no JVD Vascular: No carotid bruits; Distal pulses 2+ bilaterally Cardiac:  normal S1, S2; RRR;  soft systolic murmur   Lungs:  clear to auscultation bilaterally, no wheezing, rhonchi or rales  Abd: soft, nontender, no hepatomegaly  Ext: no edema Musculoskeletal:  No deformities, BUE and BLE strength normal and equal Skin: warm and dry  Neuro:  CNs 2-12 intact, no focal abnormalities noted Psych:  Normal affect   EKG:  The EKG was personally reviewed and demonstrates:  Aug. 1, 2022:   NSR, 1st degree AV block    Telemetry:  Telemetry was personally reviewed and demonstrates:     Relevant CV Studies:    Laboratory Data:  High Sensitivity Troponin:  No results for input(s): TROPONINIHS in the last 720 hours.   Chemistry Recent Labs  Lab 09/27/21 0525 09/28/21 0523 09/29/21 0539 09/30/21 0814  NA 147* 142 141 141  K 3.3* 3.7 3.7 3.5  CL 116* 112* 110 109  CO2 25 26 21* 24  GLUCOSE 93 91 72 114*  BUN 31* 19 23 29*  CREATININE 1.62* 1.35* 1.62* 2.15*  CALCIUM 9.1 9.1 9.4 8.7*  MG 2.2  --   --   --   GFRNONAA 32* 39* 32* 22*  ANIONGAP 6 4* 10 8    Recent Labs  Lab 09/24/21 2050 09/26/21 1748 09/27/21 0525  PROT 9.2* 8.0  7.1  ALBUMIN 4.4 3.8 3.4*  AST 17 16 13*  ALT 10 11 10   ALKPHOS 104 88 78  BILITOT 0.8 0.7 0.7   Lipids No results for input(s): CHOL, TRIG, HDL, LABVLDL, LDLCALC, CHOLHDL in the last 168 hours.  Hematology Recent Labs  Lab 09/28/21 0523 09/29/21 0539 09/30/21 0814  WBC 5.3 5.3 9.0  RBC 3.31* 3.57* 3.29*  HGB 7.8* 8.5* 7.7*  HCT 24.7* 26.2* 24.0*  MCV 74.6* 73.4* 72.9*  MCH 23.6* 23.8* 23.4*  MCHC 31.6 32.4 32.1  RDW 23.4* 24.2* 24.0*  PLT 140* 143* 132*   Thyroid  Recent Labs  Lab 09/26/21 1755 09/26/21 2029  TSH 21.262*  --   FREET4  --  0.76    BNPNo results for input(s): BNP, PROBNP in the last 168 hours.  DDimer No results for input(s): DDIMER in the last 168 hours.   Radiology/Studies:  No results found.   Assessment and Plan:   Pre OP cardiology evaluation.   Pt has no hx of CAD or CHF.   Echo from 1 year ago shows normal LV function with minimal ( and insignificant ) valvular disease .  No symptoms or CP or doe.   Based on our findings , I cannot find any cardiac diagnosis that would cause me to be concerned that she has any serious undiagnosed cardiac condition.   She is at low risk for CV complications with her hemicolectomy     Risk Assessment/Risk Scores:   [facility] will sign off.   Medication Recommendations:  cont meds.  Other recommendations (labs, testing, etc):   Follow up as an outpatient:  with her primary MD   For questions or updates, please contact [place] Please consult [email or web address] for contact info under    Signed, Mertie Moores, MD  09/30/2021 1:54 PM

## 2021-09-30 NOTE — Consult Note (Signed)
Consult Note  Susan [name] May 07, 1941  [id number].    Requesting MD: Harl Bowie Chief Complaint/Reason for Consult: Ascending colon cancer HPI:  Patient is a 80 year old female who presented to [facility] 12/25 with generalized weakness and poor appetite for several days. She reportedly went to the ED 2 days prior for the same but left without being seen due to long wait times. Patient has been having dark stools for several months it seems. Most recent colonoscopy 01/2017 and showed pan-colonic diverticulosis, no polyps or masses noted at that time. More recently she has been worked up for UGI bleeding with small bowel enteroscopy 05/04/21 which identified esophageal candidiasis and gastritis. She underwent EGD 01/09/21 which also showed mild non-specific gastritis. With most recent dark stools she denies abdominal pain, rectal pain, BRBPR, nausea, vomiting. States that she is tolerating a diet but her appetite is suppressed. She reports about a 20lb weight loss over the last 2 months. She denied taking ASA or NSAIDs and denied alcohol use. She denies family hx of colon cancer.   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 Past abdominal surgeries include umbilical hernia repair, inguinal hernia repair. She is followed by Dr. Alen Blew for hematology. Patient lives alone and has a son who lives in [place] that she plans to stay with after discharge from the hospital.  ROS: Review of Systems  Constitutional:  Positive for malaise/fatigue. Negative for chills and fever.  Respiratory:  Negative for shortness of breath and wheezing.   Cardiovascular:  Negative for chest pain and palpitations.  Gastrointestinal:  Positive for melena. Negative for abdominal pain, nausea and  vomiting.  Genitourinary:  Negative for dysuria, frequency and urgency.  Neurological:  Positive for dizziness and weakness.  All other systems reviewed and are negative.  Family History  Problem Relation Age of Onset   Stroke Father    Hypertension Father    Other Mother        died at early age   Stroke Sister    Coronary artery disease Brother    Hypertension Sister    Bladder Cancer Sister    Colon cancer Neg Hx    Stomach cancer Neg Hx     Past Medical History:  Diagnosis Date   Anemia    Arthritis    Blood transfusion without reported diagnosis    GERD (gastroesophageal reflux disease)    GI bleed    HLD (hyperlipidemia)    Hypertension    Hypothyroidism    Memory loss    Myelodysplasia    Vitamin D deficiency     Past Surgical History:  Procedure Laterality Date   BIOPSY  01/09/2021   Procedure: BIOPSY;  Surgeon: Milus Banister, MD;  Location: [facility];  Service: Endoscopy;;   BIOPSY  05/04/2021   Procedure: BIOPSY;  Surgeon: Ladene Artist, MD;  Location: [facility];  Service: Endoscopy;;   BIOPSY  09/29/2021   Procedure: BIOPSY;  Surgeon: Mauri Pole, MD;  Location: [facility];  Service: Endoscopy;;   CARPAL TUNNEL RELEASE Right    COLONOSCOPY N/A 09/29/2021   Procedure: COLONOSCOPY;  Surgeon: Mauri Pole, MD;  Location: [facility];  Service: Endoscopy;  Laterality: N/A;   DILATION AND CURETTAGE OF UTERUS     ENTEROSCOPY N/A 05/04/2021   Procedure: ENTEROSCOPY;  Surgeon: Ladene Artist, MD;  Location: [facility];  Service: Endoscopy;  Laterality: N/A;   ENTEROSCOPY N/A 09/29/2021   Procedure: ENTEROSCOPY;  Surgeon: Mauri Pole, MD;  Location: [facility];  Service: Endoscopy;  Laterality: N/A;   ESOPHAGOGASTRODUODENOSCOPY (EGD) WITH PROPOFOL N/A 01/09/2021   Procedure: ESOPHAGOGASTRODUODENOSCOPY (EGD) WITH PROPOFOL;  Surgeon: Milus Banister, MD;  Location: [facility];  Service: Endoscopy;  Laterality: N/A;   HOT  HEMOSTASIS N/A 05/04/2021   Procedure: HOT HEMOSTASIS (ARGON PLASMA COAGULATION/BICAP);  Surgeon: Ladene Artist, MD;  Location: [facility];  Service: Endoscopy;  Laterality: N/A;   INGUINAL HERNIA REPAIR     IR ANGIOGRAM FOLLOW UP STUDY  03/13/2021   IR ANGIOGRAM VISCERAL SELECTIVE  03/13/2021   IR EMBO ART  VEN HEMORR LYMPH EXTRAV  INC GUIDE ROADMAPPING  03/13/2021   IR US GUIDE VASC ACCESS RIGHT  03/13/2021   ROTATOR CUFF REPAIR Left    SUBMUCOSAL TATTOO INJECTION  05/04/2021   Procedure: SUBMUCOSAL TATTOO INJECTION;  Surgeon: Ladene Artist, MD;  Location: [facility];  Service: Endoscopy;;   SUBMUCOSAL TATTOO INJECTION  09/29/2021   Procedure: SUBMUCOSAL TATTOO INJECTION;  Surgeon: Mauri Pole, MD;  Location: [facility];  Service: Endoscopy;;   TRANSANAL HEMORRHOIDAL DEARTERIALIZATION N/A 03/31/2017   Procedure: TRANSANAL HEMORRHOIDAL DEARTERIALIZATION;  Surgeon: Leighton Ruff, MD;  Location: [facility];  Service: General;  Laterality: N/A;   UMBILICAL HERNIA REPAIR      Social History:  reports that she quit smoking about 25 years ago. Her smoking use included cigarettes. She has never used smokeless tobacco. She reports current alcohol use. She reports that she does not use drugs.  Allergies:  Allergies  Allergen Reactions   Codeine Anaphylaxis   Penicillins Anaphylaxis   Bactrim Itching and Swelling   Clarithromycin Other (See Comments)    "Caused skin to peel off my hand."   Flagyl [Metronidazole Hcl] Itching and Swelling   Food Other (See Comments)    EATS WHITE RICE ONLY (ALLERGIC REACTION TO YELLOW OR BROWN RICE THAT RESULTED IN HOSPITALIZATION)   Pineapple Itching and Swelling    SWELLING OF EYES   Sulfa Antibiotics Itching and Swelling    Medications Prior to Admission  Medication Sig Dispense Refill   acetaminophen (TYLENOL) 500 MG tablet Take 500 mg by mouth every 6 (six) hours as needed for moderate pain (for pain.).     allopurinol (ZYLOPRIM) 300 MG tablet  Take 300 mg by mouth daily.     amLODipine (NORVASC) 10 MG tablet TAKE 1/2 TABLET BY MOUTH DAILY AT BEDTIME; (TAKE 1 TABLET IF SBP IS >150) (Patient taking differently: Take 10 mg by mouth daily.) 90 tablet 0   donepezil (ARICEPT) 10 MG tablet Take 10 mg by mouth daily.     famotidine (PEPCID) 20 MG tablet Take 20 mg by mouth 2 (two) times daily.     FEROSUL 325 (65 Fe) MG tablet Take 325 mg by mouth daily.     labetalol (NORMODYNE) 200 MG tablet Take 200 mg by mouth daily.     levothyroxine (SYNTHROID) 75 MCG tablet Take 75 mcg by mouth every morning.     memantine (NAMENDA) 10 MG tablet Take 1 tablet (10 mg total) by mouth 2 (two) times daily. 60 tablet 11   mirtazapine (REMERON) 15 MG tablet Take 15 mg by mouth at bedtime.     montelukast (SINGULAIR) 10 MG tablet Take 10 mg by mouth at bedtime.     pantoprazole (PROTONIX) 40 MG  tablet Take 1 tablet (40 mg total) by mouth daily. 30 tablet 3   predniSONE (DELTASONE) 5 MG tablet Take 5 mg by mouth as directed.     traMADol (ULTRAM) 50 MG tablet Take 50 mg by mouth every 6 (six) hours as needed for moderate pain.     aspirin 81 MG chewable tablet Chew 81 mg by mouth daily.     benzonatate (TESSALON) 100 MG capsule Take 1 capsule (100 mg total) by mouth every 8 (eight) hours. (Patient not taking: Reported on 09/26/2021) 21 capsule 0    Blood pressure 110/71, pulse 77, temperature 97.7 F (36.5 C), temperature source Oral, resp. rate 19, height 5\' 2"  (1.575 m), weight 61 kg, SpO2 99 %. Physical Exam:  General: pleasant, WD, elderly female who is laying in bed in NAD HEENT: head is normocephalic, atraumatic.  Sclera are anicteric. Pupils equal and round.  Ears and nose without any masses or lesions.  Mouth is pink and moist Heart: regular, rate, and rhythm.  Normal s1,s2. No obvious murmurs, gallops, or rubs noted.  Palpable radial and pedal pulses bilaterally Lungs: CTAB, no wheezes, rhonchi, or rales noted.  Respiratory effort nonlabored Abd:  soft, NT, ND, +BS, no masses or organomegaly. Small nontender supraumbilical hernia. Small LLQ fully reducible with about a 2x2cm defect MS: all 4 extremities are symmetrical with no cyanosis, clubbing, or edema. Skin: warm and dry with no masses, lesions, or rashes Neuro: Cranial nerves 2-12 grossly intact, sensation is normal throughout Psych: A&Ox3 with an appropriate affect.   Results for orders placed or performed during the hospital encounter of 09/26/21 (from the past 48 hour(s))  CBC with Differential/Platelet     Status: Abnormal   Collection Time: 09/29/21  5:39 AM  Result Value Ref Range   WBC 5.3 4.0 - 10.5 K/uL   RBC 3.57 (L) 3.87 - 5.11 MIL/uL   Hemoglobin 8.5 (L) 12.0 - 15.0 g/dL    Comment: Reticulocyte Hemoglobin testing may be clinically indicated, consider ordering this additional test [id number]    HCT 26.2 (L) 36.0 - 46.0 %   MCV 73.4 (L) 80.0 - 100.0 fL   MCH 23.8 (L) 26.0 - 34.0 pg   MCHC 32.4 30.0 - 36.0 g/dL   RDW 24.2 (H) 11.5 - 15.5 %   Platelets 143 (L) 150 - 400 K/uL   nRBC 0.0 0.0 - 0.2 %   Neutrophils Relative % 56 %   Neutro Abs 3.0 1.7 - 7.7 K/uL   Lymphocytes Relative 31 %   Lymphs Abs 1.7 0.7 - 4.0 K/uL   Monocytes Relative 12 %   Monocytes Absolute 0.6 0.1 - 1.0 K/uL   Eosinophils Relative 1 %   Eosinophils Absolute 0.0 0.0 - 0.5 K/uL   Basophils Relative 0 %   Basophils Absolute 0.0 0.0 - 0.1 K/uL   Immature Granulocytes 0 %   Abs Immature Granulocytes 0.02 0.00 - 0.07 K/uL   Target Cells PRESENT     Comment: Performed at [facility], [place] [address] [place],  [address]  Basic metabolic panel     Status: Abnormal   Collection Time: 09/29/21  5:39 AM  Result Value Ref Range   Sodium 141 135 - 145 mmol/L   Potassium 3.7 3.5 - 5.1 mmol/L   Chloride 110 98 - 111 mmol/L   CO2 21 (L) 22 - 32 mmol/L   Glucose, Bld 72 70 - 99 mg/dL    Comment: Glucose reference range applies only to  samples taken after fasting for  at least 8 hours.   BUN 23 8 - 23 mg/dL   Creatinine, Ser 1.62 (H) 0.44 - 1.00 mg/dL   Calcium 9.4 8.9 - 10.3 mg/dL   GFR, Estimated 32 (L) >60 mL/min    Comment: (NOTE) Calculated using the CKD-EPI Creatinine Equation (2021)    Anion gap 10 5 - 15    Comment: Performed at [facility], [place] [address], [place], [place] [address]  CBC with Differential/Platelet     Status: Abnormal   Collection Time: 09/30/21  8:14 AM  Result Value Ref Range   WBC 9.0 4.0 - 10.5 K/uL   RBC 3.29 (L) 3.87 - 5.11 MIL/uL   Hemoglobin 7.7 (L) 12.0 - 15.0 g/dL    Comment: Reticulocyte Hemoglobin testing may be clinically indicated, consider ordering this additional test [id number]    HCT 24.0 (L) 36.0 - 46.0 %   MCV 72.9 (L) 80.0 - 100.0 fL   MCH 23.4 (L) 26.0 - 34.0 pg   MCHC 32.1 30.0 - 36.0 g/dL   RDW 24.0 (H) 11.5 - 15.5 %   Platelets 132 (L) 150 - 400 K/uL    Comment: Immature Platelet Fraction may be clinically indicated, consider ordering this additional test [id number]    nRBC 0.0 0.0 - 0.2 %   Neutrophils Relative % 72 %   Neutro Abs 6.5 1.7 - 7.7 K/uL   Lymphocytes Relative 19 %   Lymphs Abs 1.8 0.7 - 4.0 K/uL   Monocytes Relative 8 %   Monocytes Absolute 0.7 0.1 - 1.0 K/uL   Eosinophils Relative 0 %   Eosinophils Absolute 0.0 0.0 - 0.5 K/uL   Basophils Relative 0 %   Basophils Absolute 0.0 0.0 - 0.1 K/uL   Immature Granulocytes 1 %   Abs Immature Granulocytes 0.05 0.00 - 0.07 K/uL   Reactive, Benign Lymphocytes PRESENT    Target Cells PRESENT     Comment: Performed at [facility], [place] [address] [place], [place] [address]  Basic metabolic panel     Status: Abnormal   Collection Time: 09/30/21  8:14 AM  Result Value Ref Range   Sodium 141 135 - 145 mmol/L   Potassium 3.5 3.5 - 5.1 mmol/L   Chloride 109 98 - 111 mmol/L   CO2 24 22 - 32 mmol/L   Glucose, Bld 114 (H) 70 - 99 mg/dL    Comment: Glucose reference range applies only to samples  taken after fasting for at least 8 hours.   BUN 29 (H) 8 - 23 mg/dL   Creatinine, Ser 2.15 (H) 0.44 - 1.00 mg/dL   Calcium 8.7 (L) 8.9 - 10.3 mg/dL   GFR, Estimated 22 (L) >60 mL/min    Comment: (NOTE) Calculated using the CKD-EPI Creatinine Equation (2021)    Anion gap 8 5 - 15    Comment: Performed at [facility] [place] [address], [place], [place] [address]   No results found.    Assessment/Plan Ascending colon mass that is near obstructing - CT 11/6 with bowel wall thickening of ascending colon with mild surrounding inflammatory changed and mildly enlarged colonic lymph node - colonoscopy yesterday revealing for ascending colon mass that prelim pathology is significant for adenocarcinoma  - multiple admissions for anemia secondary to GI bleeding  - hgb 7.7 today - CEA ordered, will need staging CTs although Cr elevated currently  - I talked to the patient and her son about surgery and  they would like to proceed. Plan for robotic assisted right hemicolectomy tomorrow. Will need to do repeat colon prep given that she was on a soft diet earlier today.  FEN: CLD, LR @50  cc/h VTE: SCDs, chemical prophylaxis not ordered with acute on chronic anemia  ID: no current abx  AKI on CKD stage III - baseline Cr is 1.5-2, today Cr is 2.15 Small bowel and gastric AVMs treated with APC Diverticular bleeding s/p coil embolization of colic branch of SMA 10/402 Myelodysplasia Mild dementia Thrombocytopenia Acute on chronic anemia from GI bleeding - hgb 7.7 today, no blood transfused during this admission  Hypothyroidism HTN [place], [facility] Surgery 09/30/2021, 11:53 AM Please see Amion for pager number during day hours 7:00am-4:30pm

## 2021-09-30 NOTE — H&P (View-Only) (Signed)
Medical and cardiology evaluations done.  I agree that she has a decent echocardiogram and no major cardiopulmonary concerns aside of her advanced age.  She is about as tuneup she is going to be.  She is quite active and definitely looks younger than stated age and acts younger.  She has decent performance status and independence.   The anatomy & physiology of the digestive tract was discussed with the patient as well as her "spiritual" daughter through church.  The pathophysiology of the colon was discussed.  Natural history risks without surgery was discussed.   I feel the risks of no intervention will lead to serious problems that outweigh the operative risks; therefore, I recommended a partial colectomy to remove the pathology.  Minimally invasive (Robotic/Laparoscopic) & open techniques were discussed.   Risks such as bleeding, infection, abscess, leak, reoperation, injury to other organs, need for repair of tissues / organs, possible ostomy, hernia, heart attack, stroke, death, and other risks were discussed.  I noted a good likelihood this will help address the problem.   Goals of post-operative recovery were discussed as well.   Need for adequate nutrition, daily bowel regimen and healthy physical activity, to optimize recovery was noted as well. We will work to minimize complications.  Educational materials were available as well.  Questions were answered.    The patient expresses understanding & wishes to proceed with surgery. She wishes to be aggressive.  I have posted her for robotic colectomy with intracorporeal anastomosis tomorrow midmorning.  Most likely 10:15 AM  Adin Hector, MD, FACS, MASCRS Esophageal, Gastrointestinal & Colorectal Surgery Robotic and Minimally Invasive Surgery  [facility], [place]. [address] [place], [place] [address] [phone] Fax [phone] Main  CONTACT INFORMATION:  Weekday  (9AM-5PM): Call [facility] main office at [phone]  Weeknight (5PM-9AM) or Weekend/Holiday: Check [email or web address] (password " [email or web address]") for General Surgery [facility] coverage  (Please, do not use SecureChat as it is not reliable communication to operating surgeons for immediate patient care)

## 2021-09-30 NOTE — Progress Notes (Addendum)
PROGRESS NOTE    Susan [name]  MWU:[id number] DOB: 1941-09-24 DOA: 09/26/2021 PCP: Sonia Side., [name]   Chief Complaint  Patient presents with   Failure To Thrive   Brief Narrative/Hospital Course:  Susan [name], 80 y.o. female with PMH of hypertension, hypothyroidism, history of GI AV malformations with chronic anemia, MDS presented with anemia, melanotic stool after visiting the ER 2 days prior to [place] without being seen due to long wait time.  Per report  Son brought patient as he had not heard from her in a day or so. He noted she was confused and may have missed several days of her meds. Son noted pt had black stools. Due to this she was brought to the ED. In the ED, VSS, labs showed hgb 8.3, worsened by 1G since 2 days ago. TSH elevated at 21.26. Hemoccult positive stools.  Patient was started on Protonix drip and admitted for further management. GI was consulted   Subjective: Seen and examined this morning.  She tells me they found tumor in her colonoscopy. Patient denies any nausea vomiting abdominal pain.  Resting comfortably Hemoglobin has stayed stable 7 to 8 gram.   Assessment & Plan:  Melanotic stool Acute on chronic anemia History of GI AVMs and diverticular bleeding Partially obstructive ascending colon mass 7 cm: With drop in hemoglobin from recent ED visit, admitted due to concern for GI bleeding, underwent endoscopy post enteroscopy and colonoscopy-found to have partially obstructing tumor in the ascending colon biopsied, moderate diverticulosis, nonbleeding internal hemorrhoids.  Small bowel enteroscopy done 05/2021 showed a single nonbleeding AVM in the proximal jejunum, also showed diverticular bleed s/p angiography with coil embolization on 03/2021.  On PPI drip and serial H&H monitoring await further GI plan.  Biopsy came back positive for malignancy surgery has been consulted requesting cardiology eval for preop evaluation Recent Labs  Lab  09/27/21 1237 09/27/21 2014 09/28/21 0523 09/29/21 0539 09/30/21 0814  HGB 7.7* 7.6* 7.8* 8.5* 7.7*  HCT 24.9* 24.4* 24.7* 26.2* 24.0*    Essential hypertension: Controlled. Continue to hold home amlodipine, labetalol  CKD Stage 3b: Baseline creatinine ranging from 1.5 to 2s, here in 1.6 and uptrending- we wil l add ivf gently Recent Labs  Lab 09/26/21 1748 09/27/21 0525 09/28/21 0523 09/29/21 0539 09/30/21 0814  BUN 39* 31* 19 23 29*  CREATININE 1.69* 1.62* 1.35* 1.62* 2.15*      Hypokalemia resolved Hypernatremia resolved Hypothyroidism: TSH was elevated 29 with free T4 normal 0.7.  On Synthroid 75 MCG daily.  Not sure if she is taking daily or not.  Will need follow-up TSH in 3 weeks to adjust the dose if remains elevated  GERD: on PPI  Dementia without behavioral disturbance: Mood stable, appears pleasant continue her Remeron, Aricept and Namenda.Continue supportive care  Pyurea/bacteriuria- no fever.  Monitor.  Mld thrombocytopenia:monitor.  DVT prophylaxis: SCDs Start: 09/26/21 2224 Code Status:   Code Status: Full Code Family Communication: plan of care discussed with patient at bedside.  Son updated on the phone. Status is: Inpatient Remains inpatient appropriate because: For ongoing work-up for her anemia and GI evaluation Disposition: Currently not medically stable for discharge. Anticipated Disposition: TBD-based upon GI input-CCS consulted as biopsy came back positive.  Objective: Vitals last 24 hrs: Vitals:   09/29/21 1824 09/29/21 2000 09/29/21 2006 09/30/21 0511  BP: (!) 152/76  128/77 110/71  Pulse: (!) 102  (!) 104 77  Resp: 18 (!) 24 19 19   Temp: 99.8 F (37.7  C)  99.2 F (37.3 C) 97.7 F (36.5 C)  TempSrc: Oral  Oral Oral  SpO2: 99%  99% 99%  Weight:      Height:       Weight change: 0 kg  Intake/Output Summary (Last 24 hours) at 09/30/2021 1054 Last data filed at 09/30/2021 0948 Gross per 24 hour  Intake 904.33 ml  Output --  Net  904.33 ml   Net IO Since Admission: 2,772.91 mL [09/30/21 1054]   Physical Examination: General exam: AAOx2, pleasant HEENT:Oral mucosa moist, Ear/Nose WNL grossly, dentition normal. Respiratory system: bilaterally clear, no use of accessory muscle Cardiovascular system: S1 & S2 +, No JVD,. Gastrointestinal system: Abdomen soft, NT,ND, BS+ Nervous System:Alert, awake, moving extremities and grossly nonfocal Extremities: no edema, distal peripheral pulses palpable.  Skin: No rashes,no icterus. MSK: Normal muscle bulk,tone, power   Medications reviewed:  Scheduled Meds:  donepezil  10 mg Oral Daily   levothyroxine  75 mcg Oral q morning   memantine  10 mg Oral BID   pantoprazole  40 mg Oral BID   Continuous Infusions:   Diet Order             DIET SOFT Room service appropriate? Yes; Fluid consistency: Thin  Diet effective now                 Weight change: 0 kg  Wt Readings from Last 3 Encounters:  09/29/21 61 kg  09/24/21 63.5 kg  07/16/21 60.6 kg     Consultants:see note  Procedures:see note  EGD and colonoscopy as below: - The examined portion of the jejunum was normal. - Normal examined duodenum. - Gastritis. - Normal esophagus. - No specimens collected.  Preparation of the colon was poor. - Rule out malignancy, partially obstructing tumor in the ascending colon. Biopsied. Tattooed. - Moderate diverticulosis in the sigmoid colon, in the descending colon, in the transverse colon and in the ascending colon. - Non-bleeding internal hemorrhoids.  Antimicrobials: Anti-infectives (From admission, onward)    None      Culture/Microbiology    Component Value Date/Time   SDES  09/27/2021 0908    URINE, CLEAN CATCH Performed at [facility], [place] [address], [place], [place] [address]    SPECREQUEST  09/27/2021 0908    NONE Performed at [facility], [place] [address] [place], [place] [address]    CULT (A)  09/27/2021 0908    30,000 COLONIES/mL STREPTOCOCCUS AGALACTIAE TESTING AGAINST S. AGALACTIAE NOT ROUTINELY PERFORMED DUE TO PREDICTABILITY OF AMP/PEN/VAN SUSCEPTIBILITY. Performed at [facility], [place] [address], [place], [place] [address]    REPTSTATUS 09/28/2021 FINAL 09/27/2021 0908    Other culture-see note  Unresulted Labs (From admission, onward)     Start     Ordered   09/28/21 0500  CBC with Differential/Platelet  Daily,   R      09/27/21 1251   09/28/21 9147  Basic metabolic panel  Daily,   R      09/27/21 1251          Data Reviewed: I have personally reviewed following labs and imaging studies CBC: Recent Labs  Lab 09/24/21 2050 09/26/21 1748 09/27/21 0030 09/27/21 1237 09/27/21 2014 09/28/21 0523 09/29/21 0539 09/30/21 0814  WBC 6.6 6.2  --   --   --  5.3 5.3 9.0  NEUTROABS 4.2 4.3  --   --   --  3.3 3.0 6.5  HGB 9.2* 8.3*   < >  7.7* 7.6* 7.8* 8.5* 7.7*  HCT 29.3* 27.1*   < > 24.9* 24.4* 24.7* 26.2* 24.0*  MCV 73.4* 75.1*  --   --   --  74.6* 73.4* 72.9*  PLT 179 156  --   --   --  140* 143* 132*   < > = values in this interval not displayed.   Basic Metabolic Panel: Recent Labs  Lab 09/26/21 1748 09/27/21 0525 09/28/21 0523 09/29/21 0539 09/30/21 0814  NA 146* 147* 142 141 141  K 3.6 3.3* 3.7 3.7 3.5  CL 114* 116* 112* 110 109  CO2 26 25 26  21* 24  GLUCOSE 120* 93 91 72 114*  BUN 39* 31* 19 23 29*  CREATININE 1.69* 1.62* 1.35* 1.62* 2.15*  CALCIUM 9.8 9.1 9.1 9.4 8.7*  MG  --  2.2  --   --   --    GFR: Estimated Creatinine Clearance: 17.4 mL/min (A) (by C-G formula based on SCr of 2.15 mg/dL (H)). Liver Function Tests: Recent Labs  Lab 09/24/21 2050 09/26/21 1748 09/27/21 0525  AST 17 16 13*  ALT 10 11 10   ALKPHOS 104 88 78  BILITOT 0.8 0.7 0.7  PROT 9.2* 8.0 7.1  ALBUMIN 4.4 3.8 3.4*   No results for input(s): LIPASE, AMYLASE in the last 168 hours. No results for input(s): AMMONIA in the last 168 hours. Coagulation  Profile: No results for input(s): INR, PROTIME in the last 168 hours. Cardiac Enzymes: No results for input(s): CKTOTAL, CKMB, CKMBINDEX, TROPONINI in the last 168 hours. BNP (last 3 results) No results for input(s): PROBNP in the last 8760 hours. HbA1C: No results for input(s): HGBA1C in the last 72 hours. CBG: No results for input(s): GLUCAP in the last 168 hours. Lipid Profile: No results for input(s): CHOL, HDL, LDLCALC, TRIG, CHOLHDL, LDLDIRECT in the last 72 hours. Thyroid Function Tests: No results for input(s): TSH, T4TOTAL, FREET4, T3FREE, THYROIDAB in the last 72 hours.  Anemia Panel: No results for input(s): VITAMINB12, FOLATE, FERRITIN, TIBC, IRON, RETICCTPCT in the last 72 hours. Sepsis Labs: No results for input(s): PROCALCITON, LATICACIDVEN in the last 168 hours.  Recent Results (from the past 240 hour(s))  Resp Panel by RT-PCR (Flu A&B, Covid) Nasopharyngeal Swab     Status: None   Collection Time: 09/26/21  7:12 PM   Specimen: Nasopharyngeal Swab; Nasopharyngeal(NP) swabs in vial transport medium  Result Value Ref Range Status   SARS Coronavirus 2 by RT PCR NEGATIVE NEGATIVE Final    Comment: (NOTE) SARS-CoV-2 target nucleic acids are NOT DETECTED.  The SARS-CoV-2 RNA is generally detectable in upper respiratory specimens during the acute phase of infection. The lowest concentration of SARS-CoV-2 viral copies this assay can detect is 138 copies/mL. A negative result does not preclude SARS-Cov-2 infection and should not be used as the sole basis for treatment or other patient management decisions. A negative result may occur with  improper specimen collection/handling, submission of specimen other than nasopharyngeal swab, presence of viral mutation(s) within the areas targeted by this assay, and inadequate number of viral copies(<138 copies/mL). A negative result must be combined with clinical observations, patient history, and epidemiological information. The  expected result is Negative.  Fact Sheet for Patients:  EntrepreneurPulse.com.au  Fact Sheet for Healthcare Providers:  IncredibleEmployment.be  This test is no t yet approved or cleared by the [place] FDA and  has been authorized for detection and/or diagnosis of SARS-CoV-2 by FDA under an Emergency Use Authorization (EUA). This EUA will  remain  in effect (meaning this test can be used) for the duration of the COVID-19 declaration under Section 564(b)(1) of the Act, 21 U.S.C.section 360bbb-3(b)(1), unless the authorization is terminated  or revoked sooner.       Influenza A by PCR NEGATIVE NEGATIVE Final   Influenza B by PCR NEGATIVE NEGATIVE Final    Comment: (NOTE) The Xpert Xpress SARS-CoV-2/FLU/RSV plus assay is intended as an aid in the diagnosis of influenza from Nasopharyngeal swab specimens and should not be used as a sole basis for treatment. Nasal washings and aspirates are unacceptable for Xpert Xpress SARS-CoV-2/FLU/RSV testing.  Fact Sheet for Patients: EntrepreneurPulse.com.au  Fact Sheet for Healthcare Providers: IncredibleEmployment.be  This test is not yet approved or cleared by the [place] FDA and has been authorized for detection and/or diagnosis of SARS-CoV-2 by FDA under an Emergency Use Authorization (EUA). This EUA will remain in effect (meaning this test can be used) for the duration of the COVID-19 declaration under Section 564(b)(1) of the Act, 21 U.S.C. section 360bbb-3(b)(1), unless the authorization is terminated or revoked.  Performed at [facility] [place] [address], [place], [place] [address]   Urine Culture     Status: Abnormal   Collection Time: 09/27/21  9:08 AM   Specimen: Urine, Clean Catch  Result Value Ref Range Status   Specimen Description   Final    URINE, CLEAN CATCH Performed at [facility], [place]  [address], [place], [place] [address]    Special Requests   Final    NONE Performed at [facility], [place] [address] [place], [place] [address]    Culture (A)  Final    30,000 COLONIES/mL STREPTOCOCCUS AGALACTIAE TESTING AGAINST S. AGALACTIAE NOT ROUTINELY PERFORMED DUE TO PREDICTABILITY OF AMP/PEN/VAN SUSCEPTIBILITY. Performed at [facility], [place] [address], [place],  [address]    Report Status 09/28/2021 FINAL  Final     Radiology Studies: No results found.   LOS: 3 days   Antonieta Pert, MD [facility]  09/30/2021, 10:54 AM

## 2021-09-30 NOTE — Progress Notes (Addendum)
Medical and cardiology evaluations done.  I agree that she has a decent echocardiogram and no major cardiopulmonary concerns aside of her advanced age.  She is about as tuneup she is going to be.  She is quite active and definitely looks younger than stated age and acts younger.  She has decent performance status and independence.   The anatomy & physiology of the digestive tract was discussed with the patient as well as her "spiritual" daughter through church.  The pathophysiology of the colon was discussed.  Natural history risks without surgery was discussed.   I feel the risks of no intervention will lead to serious problems that outweigh the operative risks; therefore, I recommended a partial colectomy to remove the pathology.  Minimally invasive (Robotic/Laparoscopic) & open techniques were discussed.   Risks such as bleeding, infection, abscess, leak, reoperation, injury to other organs, need for repair of tissues / organs, possible ostomy, hernia, heart attack, stroke, death, and other risks were discussed.  I noted a good likelihood this will help address the problem.   Goals of post-operative recovery were discussed as well.   Need for adequate nutrition, daily bowel regimen and healthy physical activity, to optimize recovery was noted as well. We will work to minimize complications.  Educational materials were available as well.  Questions were answered.    The patient expresses understanding & wishes to proceed with surgery. She wishes to be aggressive.  I have posted her for robotic colectomy with intracorporeal anastomosis tomorrow midmorning.  Most likely 10:15 AM  Adin Hector, MD, FACS, MASCRS Esophageal, Gastrointestinal & Colorectal Surgery Robotic and Minimally Invasive Surgery  [facility], [place]. [address], [place], [place] [address] [phone] Fax [phone] Main  CONTACT INFORMATION:  Weekday  (9AM-5PM): Call [facility] main office at [phone]  Weeknight (5PM-9AM) or Weekend/Holiday: Check [email or web address] (password " [email or web address]") for General Surgery [facility] coverage  (Please, do not use SecureChat as it is not reliable communication to operating surgeons for immediate patient care)

## 2021-09-30 NOTE — Progress Notes (Addendum)
Progress Note Hospital Day: 5  Chief Complaint:    anemia, dark stools, colon mass    Attending physician's note   I have taken an interval history, reviewed the chart and examined the patient. I agree with the Advanced Practitioner's note, impression and recommendations.   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  GI is available inpatient if needed, please call with any questions    I have spent 25 minutes of patient care (this includes precharting, chart review, review of results, face-to-face time used for counseling as well as treatment plan and follow-up. The patient was provided an opportunity to ask questions and all were answered. The patient agreed with the plan and demonstrated an understanding of the instructions.  Susan [name] , MD [phone]      ASSESSMENT AND PLAN   Brief History : 80 yo female with PMH of HTN, hypothyroidism, myelodysplasia, CKD III, mild dementia, diverticular bleed, GERD, small hiatal hernia, gastritis, small bowel AVMs, . Admitted 12/23 with weakness, poor appetite, acute on chronic anemia / FOBT+ dark stools.   # Acute on chronic anemia ( MDS)  / dark stool / FOBT+.  Baseline hgb in 8 range down to 7.3 this admission.  Hgb has stabilized at 7.6 ( without transfusion)   # Ascending colon mass (7cm) on colonoscopy. Biopsies pending, rule out malignancy  --Await path report   DIAGNOSTIC STUDIES THIS ADMISSION:   09/29/21 Colonoscopy. Extent of exam to cecum but poor prep.   - Rule out malignancy, partially obstructing tumor in the ascending colon. Biopsied. Tattooed. - Moderate diverticulosis in the sigmoid colon, in the descending colon, in the transverse colon and in the ascending colon. - Non-bleeding  internal hemorrhoids.  09/29/21 Small bowel enteroscopy --gastritis  SUBJECTIVE   Feels okay. No abdominal pain. Last BM was yesterday. We discussed colonoscopy findings.       OBJECTIVE      Scheduled inpatient medications:   donepezil  10 mg Oral Daily   levothyroxine  75 mcg Oral q morning   memantine  10 mg Oral BID   Continuous inpatient infusions:   pantoprazole 8 mg/hr (09/30/21 0237)   PRN inpatient medications: acetaminophen **OR** acetaminophen, ondansetron **OR** ondansetron (ZOFRAN) IV  Vital signs in last 24 hours: Temp:  [97.7 F (36.5 C)-99.8 F (37.7 C)] 97.7 F (36.5 C) (12/29 0511) Pulse Rate:  [77-108] 77 (12/29 0511) Resp:  [15-24] 19 (12/29 0511) BP: (89-152)/(56-82) 110/71 (12/29 0511) SpO2:  [96 %-100 %] 99 % (12/29 0511) Weight:  [61 kg] 61 kg (12/28 1207) Last BM Date: 09/29/21  Intake/Output Summary (Last 24 hours) at 09/30/2021 0925 Last data filed at 09/29/2021 1740 Gross per 24 hour  Intake 784.33 ml  Output --  Net 784.33 ml     Physical Exam:  General: Alert female in NAD Heart:  Regular rate and rhythm. No lower extremity edema Pulmonary: Normal respiratory effort Abdomen: Soft, nondistended, nontender. Normal bowel sounds.  Neurologic: Alert and oriented Psych: Pleasant. Cooperative.   Filed Weights   09/28/21 0517 09/29/21 0500 09/29/21 1207  Weight: 61.3 kg 61 kg 61 kg    Intake/Output from previous day: 12/28 0701 - 12/29 0700 In: 784.3 [P.O.:120; I.V.:664.3] Out: -  Intake/Output this shift: No intake/output data recorded.    Lab  Results: Recent Labs    09/28/21 0523 09/29/21 0539 09/30/21 0814  WBC 5.3 5.3 9.0  HGB 7.8* 8.5* 7.7*  HCT 24.7* 26.2* 24.0*  PLT 140* 143* 132*   BMET Recent Labs    09/28/21 0523 09/29/21 0539 09/30/21 0814  NA 142 141 141  K 3.7 3.7 3.5  CL 112* 110 109  CO2 26 21* 24  GLUCOSE 91 72 114*  BUN 19 23 29*  CREATININE 1.35* 1.62* 2.15*  CALCIUM 9.1 9.4 8.7*    LFT No results for input(s): PROT, ALBUMIN, AST, ALT, ALKPHOS, BILITOT, BILIDIR, IBILI in the last 72 hours. PT/INR No results for input(s): LABPROT, INR in the last 72 hours. Hepatitis Panel No results for input(s): HEPBSAG, HCVAB, HEPAIGM, HEPBIGM in the last 72 hours.  No results found.      Principal Problem:   GI bleeding Active Problems:   Essential hypertension   Hypothyroidism   GERD (gastroesophageal reflux disease)   CKD, 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)   Dementia without behavioral disturbance (HCC)   Acute on chronic blood loss anemia   Melena   Hx of arteriovenous malformation (AVM)   GI bleed     LOS: 3 days   Susan [name] ,NP 09/30/2021, 9:25 AM

## 2021-09-30 NOTE — Progress Notes (Signed)
Consent for procedure obtained and placed in pt chart.

## 2021-10-01 ENCOUNTER — Inpatient Hospital Stay (HOSPITAL_COMMUNITY): Payer: Medicare Other | Admitting: Certified Registered Nurse Anesthetist

## 2021-10-01 ENCOUNTER — Inpatient Hospital Stay (HOSPITAL_COMMUNITY): Admission: RE | Admit: 2021-10-01 | Payer: Medicare Other | Source: Home / Self Care | Admitting: Surgery

## 2021-10-01 ENCOUNTER — Inpatient Hospital Stay: Payer: Medicare Other

## 2021-10-01 ENCOUNTER — Encounter (HOSPITAL_COMMUNITY)
Admission: EM | Disposition: A | Discharge status: Skilled Nursing Facility | Payer: Self-pay | Source: Home / Self Care | Attending: Internal Medicine

## 2021-10-01 ENCOUNTER — Other Ambulatory Visit: Payer: Self-pay

## 2021-10-01 ENCOUNTER — Encounter (HOSPITAL_COMMUNITY): Payer: Self-pay | Admitting: Internal Medicine

## 2021-10-01 ENCOUNTER — Inpatient Hospital Stay: Payer: Medicare Other | Admitting: Oncology

## 2021-10-01 LAB — BASIC METABOLIC PANEL
Anion gap: 5 (ref 5–15)
BUN: 19 mg/dL (ref 8–23)
CO2: 24 mmol/L (ref 22–32)
Calcium: 8.2 mg/dL — ABNORMAL LOW (ref 8.9–10.3)
Chloride: 106 mmol/L (ref 98–111)
Creatinine, Ser: 1.53 mg/dL — ABNORMAL HIGH (ref 0.44–1.00)
GFR, Estimated: 34 mL/min — ABNORMAL LOW (ref >60)
Glucose, Bld: 95 mg/dL (ref 70–99)
Potassium: 3.3 mmol/L — ABNORMAL LOW (ref 3.5–5.1)
Sodium: 135 mmol/L (ref 135–145)

## 2021-10-01 LAB — CBC WITH DIFFERENTIAL/PLATELET
Abs Immature Granulocytes: 0.02 10*3/uL (ref 0.00–0.07)
Basophils Absolute: 0 10*3/uL (ref 0.0–0.1)
Basophils Relative: 0 %
Eosinophils Absolute: 0.1 10*3/uL (ref 0.0–0.5)
Eosinophils Relative: 1 %
HCT: 20.6 % — ABNORMAL LOW (ref 36.0–46.0)
Hemoglobin: 6.9 g/dL — CL (ref 12.0–15.0)
Immature Granulocytes: 0 %
Lymphocytes Relative: 24 %
Lymphs Abs: 1.5 10*3/uL (ref 0.7–4.0)
MCH: 24.2 pg — ABNORMAL LOW (ref 26.0–34.0)
MCHC: 33.5 g/dL (ref 30.0–36.0)
MCV: 72.3 fL — ABNORMAL LOW (ref 80.0–100.0)
Monocytes Absolute: 0.7 10*3/uL (ref 0.1–1.0)
Monocytes Relative: 11 %
Neutro Abs: 4 10*3/uL (ref 1.7–7.7)
Neutrophils Relative %: 64 %
Platelets: 101 10*3/uL — ABNORMAL LOW (ref 150–400)
RBC: 2.85 MIL/uL — ABNORMAL LOW (ref 3.87–5.11)
RDW: 23.9 % — ABNORMAL HIGH (ref 11.5–15.5)
WBC: 6.2 10*3/uL (ref 4.0–10.5)
nRBC: 0 % (ref 0.0–0.2)

## 2021-10-01 LAB — PREPARE RBC (CROSSMATCH)

## 2021-10-01 LAB — MRSA NEXT GEN BY PCR, NASAL: MRSA by PCR Next Gen: NOT DETECTED

## 2021-10-01 LAB — HEMOGLOBIN AND HEMATOCRIT, BLOOD
HCT: 31.3 % — ABNORMAL LOW (ref 36.0–46.0)
Hemoglobin: 10.1 g/dL — ABNORMAL LOW (ref 12.0–15.0)

## 2021-10-01 LAB — CEA: CEA: 470 ng/mL — ABNORMAL HIGH (ref 0.0–4.7)

## 2021-10-01 SURGERY — COLECTOMY, RIGHT, ROBOT-ASSISTED
Anesthesia: General | Laterality: Right

## 2021-10-01 MED ORDER — DEXAMETHASONE SODIUM PHOSPHATE 10 MG/ML IJ SOLN
INTRAMUSCULAR | Status: AC
  Filled 2021-10-01: qty 1

## 2021-10-01 MED ORDER — ROCURONIUM BROMIDE 100 MG/10ML IV SOLN
INTRAVENOUS | Status: DC | PRN
  Administered 2021-10-01: 10 mg via INTRAVENOUS
  Administered 2021-10-01: 50 mg via INTRAVENOUS
  Administered 2021-10-01: 20 mg via INTRAVENOUS

## 2021-10-01 MED ORDER — SIMETHICONE 80 MG PO CHEW: 40.0000 mg | CHEWABLE_TABLET | Freq: Four times a day (QID) | ORAL | Status: DC | PRN

## 2021-10-01 MED ORDER — ONDANSETRON HCL 4 MG/2ML IJ SOLN
INTRAMUSCULAR | Status: AC
  Filled 2021-10-01: qty 2

## 2021-10-01 MED ORDER — METHOCARBAMOL 500 MG PO TABS
500.0000 mg | ORAL_TABLET | Freq: Four times a day (QID) | ORAL | Status: DC | PRN
  Administered 2021-10-02 – 2021-10-05 (×2): 500 mg via ORAL
  Filled 2021-10-01 (×2): qty 1

## 2021-10-01 MED ORDER — LIDOCAINE HCL (PF) 2 % IJ SOLN
INTRAMUSCULAR | Status: AC
  Filled 2021-10-01: qty 5

## 2021-10-01 MED ORDER — CHLORHEXIDINE GLUCONATE 0.12 % MT SOLN
15.0000 mL | Freq: Once | OROMUCOSAL | Status: AC
  Administered 2021-10-01: 09:00:00 15 mL via OROMUCOSAL

## 2021-10-01 MED ORDER — PANTOPRAZOLE SODIUM 40 MG IV SOLR
40.0000 mg | Freq: Once | INTRAVENOUS | Status: AC
  Administered 2021-10-02: 40 mg via INTRAVENOUS
  Filled 2021-10-01: qty 40

## 2021-10-01 MED ORDER — ACETAMINOPHEN 10 MG/ML IV SOLN: 1000.0000 mg | Freq: Once | INTRAVENOUS | Status: DC | PRN

## 2021-10-01 MED ORDER — SODIUM CHLORIDE 0.9% IV SOLUTION: Freq: Once | INTRAVENOUS | Status: AC

## 2021-10-01 MED ORDER — FENTANYL CITRATE (PF) 100 MCG/2ML IJ SOLN
INTRAMUSCULAR | Status: AC
  Filled 2021-10-01: qty 2

## 2021-10-01 MED ORDER — LIDOCAINE HCL (PF) 2 % IJ SOLN
INTRAMUSCULAR | Status: DC | PRN
  Administered 2021-10-01: 1.5 mg/kg/h via INTRADERMAL

## 2021-10-01 MED ORDER — BUPIVACAINE-EPINEPHRINE (PF) 0.25% -1:200000 IJ SOLN
INTRAMUSCULAR | Status: AC
  Filled 2021-10-01: qty 60

## 2021-10-01 MED ORDER — LIDOCAINE HCL (CARDIAC) PF 100 MG/5ML IV SOSY
PREFILLED_SYRINGE | INTRAVENOUS | Status: DC | PRN
  Administered 2021-10-01: 50 mg via INTRAVENOUS

## 2021-10-01 MED ORDER — CHLORHEXIDINE GLUCONATE CLOTH 2 % EX PADS
6.0000 | MEDICATED_PAD | Freq: Every day | CUTANEOUS | Status: DC
  Administered 2021-10-01 – 2021-10-04 (×2): 6 via TOPICAL

## 2021-10-01 MED ORDER — FENTANYL CITRATE PF 50 MCG/ML IJ SOSY: 12.5000 ug | PREFILLED_SYRINGE | INTRAMUSCULAR | Status: DC | PRN

## 2021-10-01 MED ORDER — LIDOCAINE HCL 2 % IJ SOLN
INTRAMUSCULAR | Status: AC
  Filled 2021-10-01: qty 20

## 2021-10-01 MED ORDER — FENTANYL CITRATE (PF) 100 MCG/2ML IJ SOLN
INTRAMUSCULAR | Status: DC | PRN
  Administered 2021-10-01 (×3): 25 ug via INTRAVENOUS
  Administered 2021-10-01: 50 ug via INTRAVENOUS

## 2021-10-01 MED ORDER — ROCURONIUM BROMIDE 10 MG/ML (PF) SYRINGE
PREFILLED_SYRINGE | INTRAVENOUS | Status: AC
  Filled 2021-10-01: qty 10

## 2021-10-01 MED ORDER — BUPIVACAINE LIPOSOME 1.3 % IJ SUSP
INTRAMUSCULAR | Status: AC
  Filled 2021-10-01: qty 20

## 2021-10-01 MED ORDER — PHENYLEPHRINE HCL (PRESSORS) 10 MG/ML IV SOLN
INTRAVENOUS | Status: DC | PRN
  Administered 2021-10-01: 80 ug via INTRAVENOUS
  Administered 2021-10-01: 40 ug via INTRAVENOUS
  Administered 2021-10-01: 80 ug via INTRAVENOUS

## 2021-10-01 MED ORDER — PROPOFOL 10 MG/ML IV BOLUS
INTRAVENOUS | Status: AC
  Filled 2021-10-01: qty 20

## 2021-10-01 MED ORDER — FENTANYL CITRATE PF 50 MCG/ML IJ SOSY
25.0000 ug | PREFILLED_SYRINGE | INTRAMUSCULAR | Status: DC | PRN
  Administered 2021-10-01: 16:00:00 25 ug via INTRAVENOUS

## 2021-10-01 MED ORDER — RINGERS IRRIGATION IR SOLN
Status: DC | PRN
  Administered 2021-10-01: 1

## 2021-10-01 MED ORDER — LACTATED RINGERS IV SOLN: INTRAVENOUS | Status: DC | PRN

## 2021-10-01 MED ORDER — BUPIVACAINE-EPINEPHRINE (PF) 0.25% -1:200000 IJ SOLN
INTRAMUSCULAR | Status: DC | PRN
  Administered 2021-10-01: 60 mL

## 2021-10-01 MED ORDER — FENTANYL CITRATE PF 50 MCG/ML IJ SOSY
PREFILLED_SYRINGE | INTRAMUSCULAR | Status: AC
  Filled 2021-10-01: qty 1

## 2021-10-01 MED ORDER — ACETAMINOPHEN 325 MG PO TABS: 650.0000 mg | ORAL_TABLET | Freq: Four times a day (QID) | ORAL | Status: DC

## 2021-10-01 MED ORDER — INDOCYANINE GREEN 25 MG IV SOLR
INTRAVENOUS | Status: AC
  Filled 2021-10-01: qty 10

## 2021-10-01 MED ORDER — EPHEDRINE SULFATE-NACL 50-0.9 MG/10ML-% IV SOSY
PREFILLED_SYRINGE | INTRAVENOUS | Status: DC | PRN
  Administered 2021-10-01: 10 mg via INTRAVENOUS
  Administered 2021-10-01 (×2): 5 mg via INTRAVENOUS

## 2021-10-01 MED ORDER — ONDANSETRON HCL 4 MG/2ML IJ SOLN
INTRAMUSCULAR | Status: DC | PRN
  Administered 2021-10-01: 4 mg via INTRAVENOUS

## 2021-10-01 MED ORDER — SODIUM CHLORIDE 0.9 % IV SOLN: INTRAVENOUS | Status: DC | PRN

## 2021-10-01 MED ORDER — DEXAMETHASONE SODIUM PHOSPHATE 10 MG/ML IJ SOLN
INTRAMUSCULAR | Status: DC | PRN
  Administered 2021-10-01: 8 mg via INTRAVENOUS

## 2021-10-01 MED ORDER — PROPOFOL 10 MG/ML IV BOLUS
INTRAVENOUS | Status: DC | PRN
  Administered 2021-10-01: 120 mg via INTRAVENOUS

## 2021-10-01 MED ORDER — SPY AGENT GREEN - (INDOCYANINE FOR INJECTION)
INTRAMUSCULAR | Status: DC | PRN
  Administered 2021-10-01: 12:00:00 5 mL via INTRAVENOUS

## 2021-10-01 MED ORDER — SUGAMMADEX SODIUM 200 MG/2ML IV SOLN
INTRAVENOUS | Status: DC | PRN
  Administered 2021-10-01: 200 mg via INTRAVENOUS

## 2021-10-01 MED ORDER — SODIUM CHLORIDE 0.9 % IR SOLN
Status: DC | PRN
  Administered 2021-10-01: 1000 mL

## 2021-10-01 MED ORDER — ONDANSETRON HCL 4 MG/2ML IJ SOLN: 4.0000 mg | Freq: Once | INTRAMUSCULAR | Status: DC | PRN

## 2021-10-01 MED ORDER — ENOXAPARIN SODIUM 30 MG/0.3ML IJ SOSY
30.0000 mg | PREFILLED_SYRINGE | INTRAMUSCULAR | Status: DC
  Administered 2021-10-02 – 2021-10-08 (×7): 30 mg via SUBCUTANEOUS
  Filled 2021-10-01 (×7): qty 0.3

## 2021-10-01 MED ORDER — FUROSEMIDE 10 MG/ML IJ SOLN: 20.0000 mg | Freq: Once | INTRAMUSCULAR | Status: DC

## 2021-10-01 MED ORDER — GABAPENTIN 100 MG PO CAPS: 100.0000 mg | ORAL_CAPSULE | Freq: Two times a day (BID) | ORAL | Status: DC

## 2021-10-01 MED ORDER — FENTANYL CITRATE (PF) 100 MCG/2ML IJ SOLN: 12.5000 ug | INTRAMUSCULAR | Status: DC | PRN

## 2021-10-01 MED ORDER — POTASSIUM CHLORIDE CRYS ER 20 MEQ PO TBCR: 40.0000 meq | EXTENDED_RELEASE_TABLET | ORAL | Status: AC

## 2021-10-01 MED ORDER — METHOCARBAMOL 500 MG IVPB - SIMPLE MED
500.0000 mg | Freq: Four times a day (QID) | INTRAVENOUS | Status: DC | PRN
  Administered 2021-10-02: 500 mg via INTRAVENOUS
  Filled 2021-10-01: qty 500

## 2021-10-01 MED ORDER — ENSURE SURGERY PO LIQD: 237.0000 mL | Freq: Two times a day (BID) | ORAL | Status: DC

## 2021-10-01 SURGICAL SUPPLY — 114 items
APPLIER CLIP 5 13 M/L LIGAMAX5 (MISCELLANEOUS)
APPLIER CLIP ROT 10 11.4 M/L (STAPLE)
BAG COUNTER SPONGE SURGICOUNT (BAG) ×2 IMPLANT
BLADE EXTENDED COATED 6.5IN (ELECTRODE) IMPLANT
CANNULA REDUC XI 12-8 STAPL (CANNULA)
CANNULA REDUCER 12-8 DVNC XI (CANNULA) IMPLANT
CELLS DAT CNTRL 66122 CELL SVR (MISCELLANEOUS) ×1 IMPLANT
CHLORAPREP W/TINT 26 (MISCELLANEOUS) ×1 IMPLANT
CLIP APPLIE 5 13 M/L LIGAMAX5 (MISCELLANEOUS) IMPLANT
CLIP APPLIE ROT 10 11.4 M/L (STAPLE) IMPLANT
COVER SURGICAL LIGHT HANDLE (MISCELLANEOUS) ×4 IMPLANT
COVER TIP SHEARS 8 DVNC (MISCELLANEOUS) ×1 IMPLANT
COVER TIP SHEARS 8MM DA VINCI (MISCELLANEOUS) ×1
DECANTER SPIKE VIAL GLASS SM (MISCELLANEOUS) ×2 IMPLANT
DERMABOND ADVANCED (GAUZE/BANDAGES/DRESSINGS) ×1
DERMABOND ADVANCED .7 DNX12 (GAUZE/BANDAGES/DRESSINGS) IMPLANT
DEVICE TROCAR PUNCTURE CLOSURE (ENDOMECHANICALS) IMPLANT
DRAIN CHANNEL 19F RND (DRAIN) IMPLANT
DRAPE ARM DVNC X/XI (DISPOSABLE) ×4 IMPLANT
DRAPE COLUMN DVNC XI (DISPOSABLE) ×1 IMPLANT
DRAPE DA VINCI XI ARM (DISPOSABLE) ×4
DRAPE DA VINCI XI COLUMN (DISPOSABLE) ×1
DRAPE SURG IRRIG POUCH 19X23 (DRAPES) ×2 IMPLANT
DRSG OPSITE POSTOP 4X10 (GAUZE/BANDAGES/DRESSINGS) IMPLANT
DRSG OPSITE POSTOP 4X6 (GAUZE/BANDAGES/DRESSINGS) IMPLANT
DRSG OPSITE POSTOP 4X8 (GAUZE/BANDAGES/DRESSINGS) IMPLANT
DRSG TEGADERM 2-3/8X2-3/4 SM (GAUZE/BANDAGES/DRESSINGS) ×6 IMPLANT
DRSG TEGADERM 4X4.75 (GAUZE/BANDAGES/DRESSINGS) IMPLANT
ELECT PENCIL ROCKER SW 15FT (MISCELLANEOUS) ×2 IMPLANT
ELECT REM PT RETURN 15FT ADLT (MISCELLANEOUS) ×2 IMPLANT
ENDOLOOP SUT PDS II  0 18 (SUTURE)
ENDOLOOP SUT PDS II 0 18 (SUTURE) IMPLANT
EVACUATOR SILICONE 100CC (DRAIN) IMPLANT
GAUZE SPONGE 2X2 8PLY STRL LF (GAUZE/BANDAGES/DRESSINGS) ×1 IMPLANT
GLOVE SURG NEOPR MICRO LF SZ8 (GLOVE) ×6 IMPLANT
GLOVE SURG UNDER LTX SZ8 (GLOVE) ×6 IMPLANT
GOWN STRL REUS W/TWL XL LVL3 (GOWN DISPOSABLE) ×6 IMPLANT
GRASPER SUT TROCAR 14GX15 (MISCELLANEOUS) IMPLANT
HOLDER FOLEY CATH W/STRAP (MISCELLANEOUS) ×2 IMPLANT
IRRIG SUCT STRYKERFLOW 2 WTIP (MISCELLANEOUS) ×2
IRRIGATION SUCT STRKRFLW 2 WTP (MISCELLANEOUS) ×1 IMPLANT
KIT PROCEDURE DA VINCI SI (MISCELLANEOUS)
KIT PROCEDURE DVNC SI (MISCELLANEOUS) IMPLANT
KIT SIGMOIDOSCOPE (SET/KITS/TRAYS/PACK) IMPLANT
KIT TURNOVER KIT A (KITS) IMPLANT
NDL INSUFFLATION 14GA 120MM (NEEDLE) ×1 IMPLANT
NEEDLE INSUFFLATION 14GA 120MM (NEEDLE) ×2 IMPLANT
PACK CARDIOVASCULAR III (CUSTOM PROCEDURE TRAY) ×2 IMPLANT
PACK COLON (CUSTOM PROCEDURE TRAY) ×2 IMPLANT
PAD POSITIONING PINK XL (MISCELLANEOUS) ×2 IMPLANT
PROTECTOR NERVE ULNAR (MISCELLANEOUS) ×4 IMPLANT
RELOAD STAPLE 45 3.5 BLU DVNC (STAPLE) IMPLANT
RELOAD STAPLE 45 4.3 GRN DVNC (STAPLE) IMPLANT
RELOAD STAPLE 60 3.5 BLU DVNC (STAPLE) IMPLANT
RELOAD STAPLE 60 4.3 GRN DVNC (STAPLE) IMPLANT
RELOAD STAPLER 3.5X45 BLU DVNC (STAPLE) IMPLANT
RELOAD STAPLER 3.5X60 BLU DVNC (STAPLE) ×2 IMPLANT
RELOAD STAPLER 4.3X45 GRN DVNC (STAPLE) IMPLANT
RELOAD STAPLER 4.3X60 GRN DVNC (STAPLE) IMPLANT
RETRACTOR WND ALEXIS 18 MED (MISCELLANEOUS) IMPLANT
RTRCTR WOUND ALEXIS 18CM MED (MISCELLANEOUS) ×2
SCISSORS LAP 5X35 DISP (ENDOMECHANICALS) ×2 IMPLANT
SEAL CANN UNIV 5-8 DVNC XI (MISCELLANEOUS) ×3 IMPLANT
SEAL XI 5MM-8MM UNIVERSAL (MISCELLANEOUS) ×3
SEALER VESSEL DA VINCI XI (MISCELLANEOUS) ×1
SEALER VESSEL EXT DVNC XI (MISCELLANEOUS) ×1 IMPLANT
SOLUTION ELECTROLUBE (MISCELLANEOUS) ×2 IMPLANT
SPONGE GAUZE 2X2 8PLY STRL LF (GAUZE/BANDAGES/DRESSINGS) ×1 IMPLANT
SPONGE GAUZE 2X2 STER 10/PKG (GAUZE/BANDAGES/DRESSINGS) ×1
STAPLER 45 DA VINCI SURE FORM (STAPLE)
STAPLER 45 SUREFORM DVNC (STAPLE) IMPLANT
STAPLER 60 DA VINCI SURE FORM (STAPLE) ×1
STAPLER 60 SUREFORM DVNC (STAPLE) IMPLANT
STAPLER CANNULA SEAL DVNC XI (STAPLE) ×1 IMPLANT
STAPLER CANNULA SEAL XI (STAPLE) ×1
STAPLER ECHELON POWER CIR 29 (STAPLE) IMPLANT
STAPLER ECHELON POWER CIR 31 (STAPLE) IMPLANT
STAPLER RELOAD 3.5X45 BLU DVNC (STAPLE)
STAPLER RELOAD 3.5X45 BLUE (STAPLE)
STAPLER RELOAD 3.5X60 BLU DVNC (STAPLE) ×2
STAPLER RELOAD 3.5X60 BLUE (STAPLE) ×2
STAPLER RELOAD 4.3X45 GREEN (STAPLE)
STAPLER RELOAD 4.3X45 GRN DVNC (STAPLE)
STAPLER RELOAD 4.3X60 GREEN (STAPLE)
STAPLER RELOAD 4.3X60 GRN DVNC (STAPLE)
STOPCOCK 4 WAY LG BORE MALE ST (IV SETS) ×4 IMPLANT
SURGILUBE 2OZ TUBE FLIPTOP (MISCELLANEOUS) IMPLANT
SUT MNCRL AB 4-0 PS2 18 (SUTURE) ×2 IMPLANT
SUT PDS AB 1 CT1 27 (SUTURE) ×4 IMPLANT
SUT PROLENE 0 CT 2 (SUTURE) IMPLANT
SUT PROLENE 2 0 KS (SUTURE) IMPLANT
SUT PROLENE 2 0 SH DA (SUTURE) IMPLANT
SUT SILK 2 0 (SUTURE)
SUT SILK 2 0 SH CR/8 (SUTURE) IMPLANT
SUT SILK 2-0 18XBRD TIE 12 (SUTURE) IMPLANT
SUT SILK 3 0 (SUTURE)
SUT SILK 3 0 SH CR/8 (SUTURE) ×2 IMPLANT
SUT SILK 3-0 18XBRD TIE 12 (SUTURE) IMPLANT
SUT V-LOC BARB 180 2/0GR6 GS22 (SUTURE)
SUT VIC AB 3-0 SH 18 (SUTURE) IMPLANT
SUT VIC AB 3-0 SH 27 (SUTURE)
SUT VIC AB 3-0 SH 27XBRD (SUTURE) IMPLANT
SUT VICRYL 0 UR6 27IN ABS (SUTURE) ×2 IMPLANT
SUTURE V-LC BRB 180 2/0GR6GS22 (SUTURE) IMPLANT
SYR 10ML ECCENTRIC (SYRINGE) ×2 IMPLANT
SYS LAPSCP GELPORT 120MM (MISCELLANEOUS)
SYS WOUND ALEXIS 18CM MED (MISCELLANEOUS) ×2
SYSTEM LAPSCP GELPORT 120MM (MISCELLANEOUS) IMPLANT
SYSTEM WOUND ALEXIS 18CM MED (MISCELLANEOUS) ×1 IMPLANT
TOWEL OR NON WOVEN STRL DISP B (DISPOSABLE) ×2 IMPLANT
TRAY FOLEY MTR SLVR 16FR STAT (SET/KITS/TRAYS/PACK) ×2 IMPLANT
TROCAR ADV FIXATION 5X100MM (TROCAR) ×2 IMPLANT
TUBING CONNECTING 10 (TUBING) ×4 IMPLANT
TUBING INSUFFLATION 10FT LAP (TUBING) ×2 IMPLANT

## 2021-10-01 NOTE — Progress Notes (Addendum)
PROGRESS NOTE    Susan [name]  NFA:[id number] DOB: 15-Mar-1941 DOA: 09/26/2021 PCP: Sonia Side., [name]   Chief Complaint  Patient presents with   Failure To Thrive   Brief Narrative/Hospital Course:  Susan [name], 80 y.o. female with PMH of hypertension, hypothyroidism, history of GI AV malformations with chronic anemia, MDS presented with anemia, melanotic stool after visiting the ER 2 days prior to [place] without being seen due to long wait time.  Per report  Son brought patient as he had not heard from her in a day or so. He noted she was confused and may have missed several days of her meds. Son noted pt had black stools. Due to this she was brought to the ED. In the ED, VSS, labs showed hgb 8.3, worsened by 1G since 2 days ago. TSH elevated at 21.26. Hemoccult positive stools.  Patient was started on Protonix drip and admitted for further management. GI was consulted-underwent EGD push enteroscopy and colonoscopy and found to have a partially obstructing ascending colon malignancy.  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  Hemicolectomy 10/01/21.  History of GI AVMs and diverticular bleeding Acute blood loss anemia in the setting of anemia of chronic disease: Hemoglobin down trended to 6.9 g this morning.  I discussed with patient's son, ordered 2 U PRBCs. Son has consented for transfusion after explaining risks and benefits alternatives.  Asked nurse to check 2-hour post transfusional H&H tonight. Recent Labs  Lab 09/27/21 2014 09/28/21 0523 09/29/21 0539  09/30/21 0814 10/01/21 0600  HGB 7.6* 7.8* 8.5* 7.7* 6.9*  HCT 24.4* 24.7* 26.2* 24.0* 20.6*    Essential hypertension: BP stable continue to hold home amlodipine, labetalol  CKD Stage 3b: Baseline creatinine ranging from 1.5 to 2s, level fluctuating this morning at 1.5 stable  Recent Labs  Lab 09/27/21 0525 09/28/21 0523 09/29/21 0539 09/30/21 0814 10/01/21 0600  BUN 31* 19 23 29* 19  CREATININE 1.62* 1.35* 1.62* 2.15* 1.53*      Hypokalemia replete Hypernatremia resolved Hypothyroidism: TSH was elevated 29 with free T4 normal 0.7.  On Synthroid 75 MCG daily.  Not sure if she is taking daily or not.  Will need follow-up TSH in 3 weeks to adjust the dose if remains elevated  GERD: Continue PPI  Dementia without behavioral disturbance: She appears fairly oriented and aware of what is going on pleasant.    Pyurea/bacteriuria- no fever.  Monitor.  Mld thrombocytopenia:monitor.  DVT prophylaxis: enoxaparin (LOVENOX) injection 40 mg Start: 10/02/21 1000 SCD's Start: 10/01/21 1611 SCD's Start: 09/30/21 1446 SCDs Start: 09/26/21 2224 Code Status:   Code Status: Full Code Family Communication: plan of care discussed with patient at bedside.  Son updated on the phone. Status is: Inpatient Remains inpatient appropriate because: For further treatment of her malignancy  Disposition: Currently not medically stable for discharge. Anticipated Disposition: TBD-based upon GI input-CCS consulted as biopsy came back positive.  Objective: Vitals last 24 hrs: Vitals:   10/01/21 1530 10/01/21 1539 10/01/21 1545 10/01/21 1600  BP: 126/85 127/80 138/80 136/83  Pulse: 70 73 69 80  Resp: 17 18 14 13   Temp:  97.8 F (36.6 C)    TempSrc:  Oral    SpO2: 100% 100% 100% 100%  Weight:      Height:       Weight change: -1.2 kg  Intake/Output Summary (Last 24 hours) at 10/01/2021 1630 Last data filed at 10/01/2021 1525 Gross per 24 hour  Intake 3687.78 ml  Output 500 ml  Net 3187.78 ml    Net IO Since Admission: 7,247.97 mL [10/01/21 1630]   Physical Examination: General exam: Mildly sleepy able to wake up since had some pain.   HEENT:Oral mucosa moist, Ear/Nose WNL grossly, dentition normal. Respiratory system: bilaterally clear, no use of accessory muscle Cardiovascular system: S1 & S2 +, No JVD,. Gastrointestinal system: Abdomen soft, surgical site with glue and dressing in place,NT,ND, BS+ Nervous System:Alert, awake, moving extremities and grossly nonfocal Extremities: no edema, distal peripheral pulses palpable.  Skin: No rashes,no icterus. MSK: Normal muscle bulk,tone, power   Medications reviewed:  Scheduled Meds:  acetaminophen  650 mg Oral Q6H   [MAR Hold] donepezil  10 mg Oral Daily   [START ON 10/02/2021] enoxaparin (LOVENOX) injection  40 mg Subcutaneous Q24H   [MAR Hold] feeding supplement  237 mL Oral BID BM   [START ON 10/02/2021] feeding supplement  237 mL Oral BID BM   fentaNYL       [MAR Hold] furosemide  20 mg Intravenous Once   gabapentin  100 mg Oral BID   [MAR Hold] levothyroxine  75 mcg Oral q morning   [MAR Hold] memantine  10 mg Oral BID   [MAR Hold] pantoprazole  40 mg Oral BID   [MAR Hold] polyethylene glycol  34 g Oral BID   [MAR Hold] potassium chloride  40 mEq Oral STAT   Continuous Infusions:  acetaminophen     [MAR Hold] lactated ringers     lactated ringers Stopped (10/01/21 0911)   methocarbamol (ROBAXIN) IV      Diet Order             Diet clear liquid Room service appropriate? Yes; Fluid consistency: Thin  Diet effective now                 Weight change: -1.2 kg  Wt Readings from Last 3 Encounters:  10/01/21 59.8 kg  09/24/21 63.5 kg  07/16/21 60.6 kg     Consultants:see note  Procedures:see note  EGD and colonoscopy as below: - The examined portion of the jejunum was normal. - Normal examined duodenum. - Gastritis. - Normal esophagus. - No specimens collected.  Preparation of the colon was  poor. - Rule out malignancy, partially obstructing tumor in the ascending colon. Biopsied. Tattooed. - Moderate diverticulosis in the sigmoid colon, in the descending colon, in the transverse colon and in the ascending colon. - Non-bleeding internal hemorrhoids.  Antimicrobials: Anti-infectives (From admission, onward)    Start     Dose/Rate Route Frequency Ordered Stop   10/01/21 0600  clindamycin (CLEOCIN) IVPB 900 mg       See Hyperspace for full Linked Orders Report.   900 mg 100 mL/hr over 30 Minutes Intravenous 60 min pre-op 09/30/21 1448 10/01/21 1040   10/01/21 0600  gentamicin (GARAMYCIN) 310 mg in dextrose 5 % 100 mL IVPB       See Hyperspace for full Linked Orders Report.   5 mg/kg  61 kg 107.8 mL/hr over 60 Minutes Intravenous 60 min pre-op 09/30/21 1448 10/01/21 1106   09/30/21 1447  neomycin (MYCIFRADIN) tablet 1,000 mg  Status:  Discontinued        1,000 mg Oral As directed 09/30/21 1448 09/30/21 1544      Culture/Microbiology    Component Value Date/Time   SDES  09/27/2021 0908    URINE, CLEAN CATCH Performed at [facility], [place] [address] [place], [place] [address]    SPECREQUEST  09/27/2021 0908    NONE Performed at [facility], [place] [address], [place], [place] [address]    CULT (A) 09/27/2021 0908    30,000 COLONIES/mL STREPTOCOCCUS AGALACTIAE TESTING AGAINST S. AGALACTIAE NOT ROUTINELY PERFORMED DUE TO PREDICTABILITY OF AMP/PEN/VAN SUSCEPTIBILITY. Performed at [facility], [place] [address], [place], [place] [address]    REPTSTATUS 09/28/2021 FINAL 09/27/2021 0908    Other culture-see note  Unresulted Labs (From admission, onward)     Start     Ordered   10/08/21 0500  Creatinine, serum  (enoxaparin (LOVENOX)  CrCl >/= 30 mL/min  )  Weekly,   R     Comments: while on enoxaparin therapy.   Question:  Specimen collection method  Answer:  Lab=Lab collect   10/01/21 1610   10/01/21 2100  Hemoglobin and  hematocrit, blood  Once-Timed,   TIMED       Question:  Specimen collection method  Answer:  Lab=Lab collect   10/01/21 1433   09/28/21 0500  CBC with Differential/Platelet  Daily,   R      09/27/21 1251   09/28/21 7106  Basic metabolic panel  Daily,   R      09/27/21 1251          Data Reviewed: I have personally reviewed following labs and imaging studies CBC: Recent Labs  Lab 09/26/21 1748 09/27/21 0030 09/27/21 2014 09/28/21 0523 09/29/21 0539 09/30/21 0814 10/01/21 0600  WBC 6.2  --   --  5.3 5.3 9.0 6.2  NEUTROABS 4.3  --   --  3.3 3.0 6.5 4.0  HGB 8.3*   < > 7.6* 7.8* 8.5* 7.7* 6.9*  HCT 27.1*   < > 24.4* 24.7* 26.2* 24.0* 20.6*  MCV 75.1*  --   --  74.6* 73.4* 72.9* 72.3*  PLT 156  --   --  140* 143* 132* 101*   < > = values in this interval not displayed.   Basic Metabolic Panel: Recent Labs  Lab 09/27/21 0525 09/28/21 0523 09/29/21 0539 09/30/21 0814 10/01/21 0600  NA 147* 142 141 141 135  K 3.3* 3.7 3.7 3.5 3.3*  CL 116* 112* 110 109 106  CO2 25 26 21* 24 24  GLUCOSE 93 91 72 114* 95  BUN 31* 19 23 29* 19  CREATININE 1.62* 1.35* 1.62* 2.15* 1.53*  CALCIUM 9.1 9.1 9.4 8.7* 8.2*  MG 2.2  --   --   --   --   PHOS  --   --   --  2.5  --    GFR: Estimated Creatinine Clearance: 22.4 mL/min (A) (by C-G formula based on SCr of 1.53 mg/dL (H)). Liver Function Tests: Recent Labs  Lab 09/24/21 2050 09/26/21 1748 09/27/21 0525  AST 17 16 13*  ALT 10 11 10   ALKPHOS 104 88 78  BILITOT 0.8 0.7 0.7  PROT 9.2* 8.0 7.1  ALBUMIN 4.4 3.8 3.4*   No results for input(s): LIPASE, AMYLASE in the last 168 hours. No results for input(s): AMMONIA in the last 168 hours. Coagulation Profile: No results for input(s): INR, PROTIME in the last 168  hours. Cardiac Enzymes: No results for input(s): CKTOTAL, CKMB, CKMBINDEX, TROPONINI in the last 168 hours. BNP (last 3 results) No results for input(s): PROBNP in the last 8760 hours. HbA1C: Recent Labs     09/30/21 1513  HGBA1C 4.9   CBG: No results for input(s): GLUCAP in the last 168 hours. Lipid Profile: No results for input(s): CHOL, HDL, LDLCALC, TRIG, CHOLHDL, LDLDIRECT in the last 72 hours. Thyroid Function Tests: No results for input(s): TSH, T4TOTAL, FREET4, T3FREE, THYROIDAB in the last 72 hours.  Anemia Panel: No results for input(s): VITAMINB12, FOLATE, FERRITIN, TIBC, IRON, RETICCTPCT in the last 72 hours. Sepsis Labs: No results for input(s): PROCALCITON, LATICACIDVEN in the last 168 hours.  Recent Results (from the past 240 hour(s))  Resp Panel by RT-PCR (Flu A&B, Covid) Nasopharyngeal Swab     Status: None   Collection Time: 09/26/21  7:12 PM   Specimen: Nasopharyngeal Swab; Nasopharyngeal(NP) swabs in vial transport medium  Result Value Ref Range Status   SARS Coronavirus 2 by RT PCR NEGATIVE NEGATIVE Final    Comment: (NOTE) SARS-CoV-2 target nucleic acids are NOT DETECTED.  The SARS-CoV-2 RNA is generally detectable in upper respiratory specimens during the acute phase of infection. The lowest concentration of SARS-CoV-2 viral copies this assay can detect is 138 copies/mL. A negative result does not preclude SARS-Cov-2 infection and should not be used as the sole basis for treatment or other patient management decisions. A negative result may occur with  improper specimen collection/handling, submission of specimen other than nasopharyngeal swab, presence of viral mutation(s) within the areas targeted by this assay, and inadequate number of viral copies(<138 copies/mL). A negative result must be combined with clinical observations, patient history, and epidemiological information. The expected result is Negative.  Fact Sheet for Patients:  EntrepreneurPulse.com.au  Fact Sheet for Healthcare Providers:  IncredibleEmployment.be  This test is no t yet approved or cleared by the [place] FDA and  has been authorized for  detection and/or diagnosis of SARS-CoV-2 by FDA under an Emergency Use Authorization (EUA). This EUA will remain  in effect (meaning this test can be used) for the duration of the COVID-19 declaration under Section 564(b)(1) of the Act, 21 U.S.C.section 360bbb-3(b)(1), unless the authorization is terminated  or revoked sooner.       Influenza A by PCR NEGATIVE NEGATIVE Final   Influenza B by PCR NEGATIVE NEGATIVE Final    Comment: (NOTE) The Xpert Xpress SARS-CoV-2/FLU/RSV plus assay is intended as an aid in the diagnosis of influenza from Nasopharyngeal swab specimens and should not be used as a sole basis for treatment. Nasal washings and aspirates are unacceptable for Xpert Xpress SARS-CoV-2/FLU/RSV testing.  Fact Sheet for Patients: EntrepreneurPulse.com.au  Fact Sheet for Healthcare Providers: IncredibleEmployment.be  This test is not yet approved or cleared by the [place] FDA and has been authorized for detection and/or diagnosis of SARS-CoV-2 by FDA under an Emergency Use Authorization (EUA). This EUA will remain in effect (meaning this test can be used) for the duration of the COVID-19 declaration under Section 564(b)(1) of the Act, 21 U.S.C. section 360bbb-3(b)(1), unless the authorization is terminated or revoked.  Performed at [facility], [place] [address] [place], [place] [address]   Urine Culture     Status: Abnormal   Collection Time: 09/27/21  9:08 AM   Specimen: Urine, Clean Catch  Result Value Ref Range Status   Specimen Description   Final    URINE, CLEAN CATCH Performed at [facility], [place] [address], [place], [place] [address]    Special Requests   Final    NONE Performed at [facility], [place] [address] [place], [place] [address]    Culture (A)  Final    30,000 COLONIES/mL STREPTOCOCCUS AGALACTIAE TESTING AGAINST S. AGALACTIAE NOT ROUTINELY PERFORMED  DUE TO PREDICTABILITY OF AMP/PEN/VAN SUSCEPTIBILITY. Performed at [facility], [place] [address] [place], [place] [address]    Report Status 09/28/2021 FINAL  Final     Radiology Studies: No results found.   LOS: 4 days   Antonieta Pert, MD [facility]  10/01/2021, 4:30 PM

## 2021-10-01 NOTE — Anesthesia Procedure Notes (Signed)
Procedure Name: Intubation Date/Time: 10/01/2021 11:38 AM Performed by: Garrel Ridgel, CRNA Pre-anesthesia Checklist: Patient identified, Emergency Drugs available, Suction available and Patient being monitored Patient Re-evaluated:Patient Re-evaluated prior to induction Oxygen Delivery Method: Circle system utilized Preoxygenation: Pre-oxygenation with 100% oxygen Induction Type: IV induction Ventilation: Mask ventilation without difficulty Laryngoscope Size: Mac and 3 Grade View: Grade II Tube type: Oral Tube size: 7.0 mm Number of attempts: 1 Airway Equipment and Method: Stylet and Oral airway Placement Confirmation: ETT inserted through vocal cords under direct vision, positive ETCO2 and breath sounds checked- equal and bilateral Secured at: 21 cm Tube secured with: Tape Dental Injury: Teeth and Oropharynx as per pre-operative assessment

## 2021-10-01 NOTE — Progress Notes (Signed)
This nurse gave the pt some apple juice to sip on this evening and the pt would choke and cough with each sip x3. This nurse will reassess the pt's swallowing when she is more awake. Hold PO meds at this time. Will reach out to pharmacy to see what can be given IV. Will cont with current plan of care.

## 2021-10-01 NOTE — Anesthesia Postprocedure Evaluation (Signed)
Anesthesia Post Note  Patient: Susan [name]  Procedure(s) Performed: XI ROBOT ASSISTED RIGHT HEMICOLECTOMY, WITH TAP BLOCK, ASSESSMENT OF TISSUE PERFUSION USING FIREFLY (Right)     Patient location during evaluation: PACU Anesthesia Type: General Level of consciousness: awake Pain management: pain level controlled Vital Signs Assessment: post-procedure vital signs reviewed and stable Respiratory status: spontaneous breathing Cardiovascular status: stable Anesthetic complications: no   No notable events documented.  Last Vitals:  Vitals:   10/01/21 1525 10/01/21 1530  BP: 123/83 126/85  Pulse: 71 70  Resp: 16 17  Temp:    SpO2: 100% 100%    Last Pain:  Vitals:   10/01/21 1530  TempSrc:   PainSc: 0-No pain                 Huston Foley

## 2021-10-01 NOTE — Progress Notes (Signed)
PT Cancellation Note  Patient Details Name: Jaquay Morneault Kluth MRN: [id number] DOB: 05/06/1941   Cancelled Treatment:    Reason Eval/Treat Not Completed: Patient not medically ready Pt with low hgb and scheduled for surgery today.  Will f/u at later date. Abran Richard, PT Acute Rehab Services Pager [phone] [facility] [place] 10/01/2021, 9:38 AM

## 2021-10-01 NOTE — Progress Notes (Signed)
°   10/01/21 1954  Note  Observations Confirmed with previous PACU RN that the pt chart binder from previous room #1604 was handed off in the nurses station in ICU.

## 2021-10-01 NOTE — Anesthesia Preprocedure Evaluation (Signed)
Anesthesia Evaluation  Patient identified by MRN, date of birth, ID band Patient awake    Reviewed: Allergy & Precautions, NPO status , Patient's Chart, lab work & pertinent test results, reviewed documented beta blocker date and time   Airway Mallampati: I  TM Distance: >3 FB Neck ROM: Full    Dental  (+) Edentulous Upper, Edentulous Lower   Pulmonary former smoker,    Pulmonary exam normal breath sounds clear to auscultation       Cardiovascular hypertension, Pt. on medications and Pt. on home beta blockers Normal cardiovascular exam Rhythm:Regular Rate:Normal  EKG 05/03/21 NSR, prolonged PR interval  Echo 06/24/20 1. Left ventricular ejection fraction, by estimation, is 60 to 65%. The left ventricle has normal function. The left ventricle has no regional wall motion abnormalities. Left ventricular diastolic parameters were normal.  2. Right ventricular systolic function is normal. The right ventricular size is normal. Tricuspid regurgitation signal is inadequate for assessing PA pressure.  3. The mitral valve is normal in structure. Trivial mitral valve regurgitation. No evidence of mitral stenosis.  4. The aortic valve is normal in structure. Aortic valve regurgitation is trivial. No aortic stenosis is present.  5. The inferior vena cava is dilated in size with <50% respiratory variability, suggesting right atrial pressure of 15 mmHg.    Neuro/Psych PSYCHIATRIC DISORDERS Dementia Memory loss   GI/Hepatic Neg liver ROS, GERD  Medicated,Hx/o recurrent GI Bleeds due to GI AVM's Melena    Endo/Other  Hypothyroidism   Renal/GU Renal InsufficiencyRenal disease              Musculoskeletal  (+) Arthritis , Osteoarthritis,    Abdominal Normal abdominal exam  (+)   Peds  Hematology  (+) anemia , Hx/o Myelodysplasia      Thrombocytopenia- mild   Anesthesia Other Findings   Reproductive/Obstetrics                              Anesthesia Physical  Anesthesia Plan  ASA: 3  Anesthesia Plan: General   Post-op Pain Management:    Induction: Intravenous  PONV Risk Score and Plan: 4 or greater and Treatment may vary due to age or medical condition and Ondansetron  Airway Management Planned: Oral ETT  Additional Equipment: None  Intra-op Plan:   Post-operative Plan:   Informed Consent: I have reviewed the patients History and Physical, chart, labs and discussed the procedure including the risks, benefits and alternatives for the proposed anesthesia with the patient or authorized representative who has indicated his/her understanding and acceptance.     Dental advisory given  Plan Discussed with: CRNA  Anesthesia Plan Comments:         Anesthesia Quick Evaluation

## 2021-10-01 NOTE — Op Note (Addendum)
10/01/2021  1:20 PM  PATIENT:  Susan [name]  80 y.o. female  Patient Care Team: Sonia Side., [name] as PCP - General (Family Medicine) Wyatt Portela, MD (Hematology and Oncology) Inda Castle, MD (Inactive) as Consulting Physician (Gastroenterology) Malena Catholic, MD as Referring Physician Monna Fam, MD as Consulting Physician (Ophthalmology)  PRE-OPERATIVE DIAGNOSIS:  Ascending colon cancer  POST-OPERATIVE DIAGNOSIS:   ASCENDING COLON CANCER WITH BLEEDING LEFT INGUINAL HERNIA  PROCEDURE:   ROBOTIC PROXIMAL COLECTOMY TRANSVERSUS ABDOMINIS PLANE (TAP) BLOCK - BILATERAL INTRAOPERATIVE ASSESSMENT OF TISSUE VASCULAR PERFUSION USING ICG (indocyanine green) IMMUNOFLUORESCENCE  SURGEON:  Adin Hector, MD, FACS   ASSISTANT: Bronwen Betters, MD, FACS   ANESTHESIA:     General  Nerve block provided with liposomal bupivacaine (Experel) mixed with 0.25% bupivacaine as a Bilateral TAP block x 78mL each side at the level of the transverse abdominis & preperitoneal spaces along the flank at the anterior axillary line, from subcostal ridge to iliac crest under laparoscopic guidance   Local field block at port sites & extraction wound  EBL:  Total I/O In: 1257.8 [I.V.:520; Blood:630; IV Piggyback:107.8] Out: 500 [Urine:450; Blood:50]  Delay start of Pharmacological VTE agent (>24hrs) due to surgical blood loss or risk of bleeding:  no  DRAINS: none   SPECIMEN:  PROXIMAL RIGHT COLON  DISPOSITION OF SPECIMEN:  PATHOLOGY  COUNTS:  YES  PLAN OF CARE: Admit to inpatient   PATIENT DISPOSITION:  PACU - hemodynamically stable.  INDICATION:    Pleasant elderly woman with intermittent episodes of GI bleeding.  Known AVMs and some heartburn/reflux issues.  Had recurrent symptoms.  Embolized.  Thought maybe to be diverticular.  Mass found on CAT scan and readmission.  Colonoscopy noted mass in ascending colon.  Biopsy consistent with adenocarcinoma.  Surgical consultation  requested despite her advanced age, she has very good performance status and had medical and cardiac clearance.  Therefore, I recommended segmental resection:  The anatomy & physiology of the digestive tract was discussed.  The pathophysiology was discussed.  Natural history risks without surgery was discussed.   I worked to give an overview of the disease and the frequent need to have multispecialty involvement.  I feel the risks of no intervention will lead to serious problems that outweigh the operative risks; therefore, I recommended a partial colectomy to remove the pathology.  Laparoscopic & open techniques were discussed.   Risks such as bleeding, infection, abscess, leak, reoperation, possible ostomy, hernia, heart attack, death, and other risks were discussed.  I noted a good likelihood this will help address the problem.   Goals of post-operative recovery were discussed as well.  We will work to minimize complications.  An educational handout on the pathology was given as well.  Questions were answered.    The patient expresses understanding & wishes to proceed with surgery.  OR FINDINGS:   Patient had bulky mass in distal ascending colon just proximal to the hepatic flexure.  Some attachment to Gerota's fascia of right kidney so removed en bloc.  Otherwise no major adhesions.  Suspect partially obstructing.  No obvious metastatic disease on visceral parietal peritoneum or liver.  No recurrent periumbilical hernia.  Small left-sided direct space inguinal hernia obvious with insufflation.  No incarceration and reducible.  Held off on any repair, especially in the setting of colectomy  It is an isoperistaltic ileocolonic anastomosis (distal ileum to mid transverse colon) that rests in the right upper quadrant /hepatic flexure.  CASE  DATA:  Type of patient?: LDOW CASE (Surgical Hospitalist [facility] Inpatient)  Status of Case? URGENT Add On  Infection Present At Time Of Surgery (PATOS)?   NO  DESCRIPTION:   Informed consent was confirmed.  Patient had become more anemic and was transfused 1 unit before the case and another unit during the case.  The patient underwent general anaesthesia without difficulty.  The patient was positioned with arms tucked & secured appropriately.  VTE prevention in place.  The patient's abdomen was clipped, prepped, & draped in a sterile fashion.  Surgical timeout confirmed our plan.  The patient was positioned in reverse Trendelenburg.  Abdominal entry was gained using Varess technique at the left subcostal ridge on the anterior abdominal wall.  No elevated EtCO2 noted.  Port placed.  Camera inspection revealed no injury.  Extra ports were carefully placed under direct laparoscopic visualization.  Confirm tattooing and bulky mass at the hepatic flexure.  We docked the [employer] robot carefully and placed intstruments under visualization  I mobilized & reflected the greater omentum in the upper abdomen.  Position the small bowel into the left lower quadrant and pelvis.   I was able to elevate the proximal colon to isolate the ileocolonic pedicle.  I scored the ileal mesentery just proximal to that.   I carried that further dissection in a medial to lateral fashion.  I was able to bluntly get into the retro-mesenteric plane on the right side.  I freed the proximal right sided colonic mesentery off the retroperitoneum including the duodenal sweep, pancreatic head, & the right kidney.  I ended up taking a fair amount of Gerota's fascia perirenal fat off the right kidney en bloc.  I was able to get underneath the hepatic flexure.  I was able to get underneath the proximal and mid transverse colon.  I isolated the proximal ileocecal pedicle.  I skeletonized it & transected the vessels.    I then proceeded to mobilize the terminal ileum & proximal "right" colon in a lateral to medial fashion.  I mobilized the distal ileal mesentery off its retroperitoneal and  pelvic attachments.  I mobilized the ascending colon off It is side wall attachments to the paracolic gutter and retroperitoneum.  I also mobilized the greater omentum off the mid transverse colon and mobilized the mid to proximal transverse colon in a superior to inferior fashion.  This allowed me to mobilize the hepatic flexure and get a complete mobilization of the proximal "right" colon to the mid-transverse colon.   I could isolate the pathology.   Chose an area in the distal ileum and transected the mesentery radially chose an area in the transverse colon just proximal to a dominant middle colic artery pedicle and transected that in a radial fashion to good location.  We confirmed good viability of the ileum and transverse colon plan for anastomosis.  However given her advanced age and history of prior colonic mesenteric embolization, I decided to more aggressively evaluate blood supply.  To access vascular perfusion of tissues, we asked anesthesia use intravenous  indocyanine green (ICG) with IV flush.  I switched to the NIR fluorescence (Firefly mode) imaging window on the daVinci robot platform.  We were able to see good light green visualization of blood vessels with good vascular perfusion of tissues, confirming good tissue perfusion of tissues (ileum and transverse colon) planned for anastomosis.  When he went ahead and proceeded with transection.  We transected at the distal ileum with a robotic stapler  39mm blue load.  We then transected transverse colon with a robotic stapler 84mm blue load.  We confirmed hemostasis.   I did a side-to-side stapled anastomosis of ileum to mid-transverse colon using a 49mm white load in an isoperistaltic fashion.  (Distal stump of ileum to mid transverse colon for the distal end of the anastomosis.  Proximal end of colon stump to more proximal ileum for the proximal end of the anastomosis).  I sewed the common staple channel wound with an absorbable suture (V-lock)  in a running [place] fashion from each corner and meeting in the center.  I did meticulous inspection prove an airtight closure.  I protected the anastomosis line with an anterior omentopexy of greater omentum using V lock suture.    We did reinspection of the abdomen.  Hemostasis was good.   Ureters, retroperitoneum, and bowel uninjured.  The anastomosis looked healthy.   Endoluminal gas was evacuated.  We placed the wound protector through the suprapubic 28mm port site after it was enlarged in a Pfannenstiel fashion.  Specimen removed without incident.   Ports & wound protector removed.  Hemostasis was good.  Sterile unused instruments were used from this point.  I closed the skin at the port sites using Monocryl stitch and sterile dressing.  I closed the extraction wound using a 0 Vicryl vertical peritoneal closure and a #1 PDS transverse anterior rectal fascial closure like a small Pfannenstiel closure. I closed the skin with some interrupted Monocryl stitches.  I placed sterile dressings.     Patient is being extubated go to recovery room.  Her case went well but given her advanced age and need for transfusion, I felt it would be safest to have her be in the stepdown unit at least overnight to monitor make sure she has no cardiopulmonary or bleeding issues.  I discussed postop care with the patient in detail the office & in the holding area. Instructions are written. I discussed operative findings, updated the patient's status, discussed probable steps to recovery, and gave postoperative recommendations to the patient's son, Duwaine Maxin .  Recommendations were made.  Questions were answered.  He expressed understanding & appreciation.  Adin Hector, M.D., F.A.C.S. Gastrointestinal and Minimally Invasive Surgery [facility] [address], [place], [place] [address] [phone] Main / Paging

## 2021-10-01 NOTE — Progress Notes (Signed)
OT Cancellation Note  Patient Details Name: Susan [name] MRN: [id number] DOB: [birth year]   Cancelled Treatment:    Reason Eval/Treat Not Completed: Other (comment) Patient has low HGB and is scheduled for surgery today. Will OT therapy evaluation until after surgery and when patient hemodynamically stable.  [name] [name] 10/01/2021, 7:33 AM

## 2021-10-01 NOTE — Interval H&P Note (Signed)
History and Physical Interval Note:  10/01/2021 9:46 AM  Susan [name]  has presented today for surgery, with the diagnosis of ascending colon mass.  The various methods of treatment have been discussed with the patient and family. After consideration of risks, benefits and other options for treatment, the patient has consented to  Procedure(s): XI ROBOT ASSISTED RIGHT HEMICOLECTOMY (Right) as a surgical intervention.  The patient's history has been reviewed, patient examined, no change in status, stable for surgery.  I have reviewed the patient's chart and labs.  Questions were answered to the patient's satisfaction.    I have re-reviewed the the patient's records, history, medications, and allergies.  I have re-examined the patient.  I again discussed intraoperative plans and goals of post-operative recovery.  The patient agrees to proceed.  Susan [name]  18-Nov-1940 [id number]  Patient Care Team: Sonia Side., [name] as PCP - General (Family Medicine) Wyatt Portela, MD (Hematology and Oncology) Inda Castle, MD (Inactive) as Consulting Physician (Gastroenterology) Malena Catholic, MD as Referring Physician Monna Fam, MD as Consulting Physician (Ophthalmology)  Patient Active Problem List   Diagnosis Date Noted   Cancer of ascending colon ([place]) 09/30/2021    Priority: High   GI bleeding 09/26/2021    Priority: High   Dementia without behavioral disturbance ([place]) 02/27/2020    Priority: Medium    IDA (iron deficiency anemia) 09/30/2021   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   GI bleed 09/27/2021   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   Gastritis without bleeding    Melena    Thrombocytopenia ([place]) 05/03/2021   Allergic rhinitis 03/13/2021   Acute on chronic blood loss anemia 01/08/2021   Cerebral vascular disease 06/09/2020   Pain due to onychomycosis of toenails of both feet 04/08/2020   Porokeratosis 04/08/2020   Late onset Alzheimer's  disease without behavioral disturbance ([place]) 06/12/2019   Gouty arthritis 05/28/2019   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   BMI 29.0-29.9,adult 09/02/2015   Encounter for Medicare annual wellness exam 06/02/2015   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[place]) 11/03/2014   CKD, 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) 11/02/2014   Medication management 01/27/2014   Hyperlipidemia 08/27/2013   Essential hypertension    Hypothyroidism    GERD (gastroesophageal reflux disease)    Vitamin D deficiency    Hemorrhoids 07/15/2013    Past Medical History:  Diagnosis Date   Anemia    Arthritis    Blood transfusion without reported diagnosis    GERD (gastroesophageal reflux disease)    GI bleed    HLD (hyperlipidemia)    Hypertension    Hypothyroidism    Memory loss    Myelodysplasia    Vitamin D deficiency     Past Surgical History:  Procedure Laterality Date   BIOPSY  01/09/2021   Procedure: BIOPSY;  Surgeon: Milus Banister, MD;  [facility];  Service: Endoscopy;;   BIOPSY  05/04/2021   Procedure: BIOPSY;  Surgeon: Ladene Artist, MD;  [facility];  Service: Endoscopy;;   BIOPSY  09/29/2021   Procedure: BIOPSY;  Surgeon: Mauri Pole, MD;  [facility];  Service: Endoscopy;;   CARPAL TUNNEL RELEASE Right    COLONOSCOPY N/A 09/29/2021   Procedure: COLONOSCOPY;  Surgeon: Mauri Pole, MD;  [facility];  Service: Endoscopy;  Laterality: N/A;   DILATION AND CURETTAGE OF UTERUS     ENTEROSCOPY N/A 05/04/2021   Procedure: ENTEROSCOPY;  Surgeon: Ladene Artist,  MD;  [facility];  Service: Endoscopy;  Laterality: N/A;   ENTEROSCOPY N/A 09/29/2021   Procedure: ENTEROSCOPY;  Surgeon: Mauri Pole, MD;  [facility];  Service: Endoscopy;  Laterality: N/A;   ESOPHAGOGASTRODUODENOSCOPY (EGD) WITH PROPOFOL N/A 01/09/2021   Procedure: ESOPHAGOGASTRODUODENOSCOPY (EGD) WITH PROPOFOL;  Surgeon: Milus Banister, MD;  [facility];  Service: Endoscopy;  Laterality: N/A;   HOT HEMOSTASIS N/A 05/04/2021   Procedure: HOT HEMOSTASIS (ARGON PLASMA COAGULATION/BICAP);  Surgeon: Ladene Artist, MD;  [facility];  Service: Endoscopy;  Laterality: N/A;   INGUINAL HERNIA REPAIR     IR ANGIOGRAM FOLLOW UP STUDY  03/13/2021   IR ANGIOGRAM VISCERAL SELECTIVE  03/13/2021   IR EMBO ART  VEN HEMORR LYMPH EXTRAV  INC GUIDE ROADMAPPING  03/13/2021   IR US GUIDE VASC ACCESS RIGHT  03/13/2021   ROTATOR CUFF REPAIR Left    SUBMUCOSAL TATTOO INJECTION  05/04/2021   Procedure: SUBMUCOSAL TATTOO INJECTION;  Surgeon: Ladene Artist, MD;  [facility];  Service: Endoscopy;;   SUBMUCOSAL TATTOO INJECTION  09/29/2021   Procedure: SUBMUCOSAL TATTOO INJECTION;  Surgeon: Mauri Pole, MD;  [facility];  Service: Endoscopy;;   TRANSANAL HEMORRHOIDAL DEARTERIALIZATION N/A 03/31/2017   Procedure: TRANSANAL HEMORRHOIDAL DEARTERIALIZATION;  Surgeon: Leighton Ruff, MD;  [facility];  Service: General;  Laterality: N/A;   UMBILICAL HERNIA REPAIR      Social History   Socioeconomic History   Marital status: Single    Spouse name: Not on file   Number of children: 1   Years of education: college   Highest education level: Bachelor's degree (e.g., BA, AB, BS)  Occupational History   Occupation: retired    Fish farm manager: RETIRED  Tobacco Use   Smoking status: Former    Types: Cigarettes    Quit date: 10/02/1996    Years since quitting: 25.0   Smokeless tobacco: Never  Vaping Use   Vaping Use: Never used  Substance and Sexual Activity   Alcohol use: Yes    Comment: glass of wine at least once a month.   Drug use: No   Sexual activity: Not Currently    Partners: Male  Other Topics Concern   Not on file  Social History Narrative   Lives at home alone.   Right-handed.   No daily use of caffeine.   Social Determinants of Health   Financial Resource Strain: Not on file  Food Insecurity: Not on  file  Transportation Needs: Not on file  Physical Activity: Not on file  Stress: Not on file  Social Connections: Not on file  Intimate Partner Violence: Not on file    Family History  Problem Relation Age of Onset   Stroke Father    Hypertension Father    Other Mother        died at early age   Stroke Sister    Coronary artery disease Brother    Hypertension Sister    Bladder Cancer Sister    Colon cancer Neg Hx    Stomach cancer Neg Hx     Medications Prior to Admission  Medication Sig Dispense Refill Last Dose   acetaminophen (TYLENOL) 500 MG tablet Take 500 mg by mouth every 6 (six) hours as needed for moderate pain (for pain.).   unknown   allopurinol (ZYLOPRIM) 300 MG tablet Take 300 mg by mouth daily.   unknown   amLODipine (NORVASC) 10 MG tablet TAKE 1/2 TABLET BY MOUTH DAILY AT  BEDTIME; (TAKE 1 TABLET IF SBP IS >150) (Patient taking differently: Take 10 mg by mouth daily.) 90 tablet 0 unknown   donepezil (ARICEPT) 10 MG tablet Take 10 mg by mouth daily.   unknown   famotidine (PEPCID) 20 MG tablet Take 20 mg by mouth 2 (two) times daily.   unknown   FEROSUL 325 (65 Fe) MG tablet Take 325 mg by mouth daily.   unknown   labetalol (NORMODYNE) 200 MG tablet Take 200 mg by mouth daily.   unknown   levothyroxine (SYNTHROID) 75 MCG tablet Take 75 mcg by mouth every morning.   unknown   memantine (NAMENDA) 10 MG tablet Take 1 tablet (10 mg total) by mouth 2 (two) times daily. 60 tablet 11 unknown   mirtazapine (REMERON) 15 MG tablet Take 15 mg by mouth at bedtime.   unknown   montelukast (SINGULAIR) 10 MG tablet Take 10 mg by mouth at bedtime.   unknown   pantoprazole (PROTONIX) 40 MG tablet Take 1 tablet (40 mg total) by mouth daily. 30 tablet 3 unknown   predniSONE (DELTASONE) 5 MG tablet Take 5 mg by mouth as directed.      traMADol (ULTRAM) 50 MG tablet Take 50 mg by mouth every 6 (six) hours as needed for moderate pain.   unknown   aspirin 81 MG chewable tablet Chew 81 mg  by mouth daily.   unknown   benzonatate (TESSALON) 100 MG capsule Take 1 capsule (100 mg total) by mouth every 8 (eight) hours. (Patient not taking: Reported on 09/26/2021) 21 capsule 0 Completed Course    Current Facility-Administered Medications  Medication Dose Route Frequency Provider Last Rate Last Admin   [MAR Hold] acetaminophen (TYLENOL) tablet 650 mg  650 mg Oral Q6H PRN Kristopher Oppenheim, DO       Or   Doug Sou Hold] acetaminophen (TYLENOL) suppository 650 mg  650 mg Rectal Q6H PRN Kristopher Oppenheim, DO       bupivacaine liposome (EXPAREL) 1.3 % injection 266 mg  20 mL Infiltration On Call to OR Michael Boston, MD       clindamycin (CLEOCIN) IVPB 900 mg  900 mg Intravenous 60 min Pre-Op Michael Boston, MD       And   gentamicin (GARAMYCIN) 310 mg in dextrose 5 % 100 mL IVPB  5 mg/kg Intravenous 60 min Pre-Op Michael Boston, MD       [MAR Hold] donepezil (ARICEPT) tablet 10 mg  10 mg Oral Daily Kristopher Oppenheim, DO   10 mg at 09/30/21 0847   [MAR Hold] feeding supplement (ENSURE SURGERY) liquid 237 mL  237 mL Oral BID BM Norm Parcel, PA-C       [facility] Hold] furosemide (LASIX) injection 20 mg  20 mg Intravenous Once Kc, Maren [place], MD       Doug Sou Hold] lactated ringers bolus 1,000 mL  1,000 mL Intravenous Q8H PRN Michael Boston, MD       lactated ringers infusion   Intravenous Continuous Antonieta Pert, MD   Stopped at 10/01/21 0911   [MAR Hold] levothyroxine (SYNTHROID) tablet 75 mcg  75 mcg Oral q morning Kristopher Oppenheim, DO   75 mcg at 10/01/21 0445   [MAR Hold] lip balm (CARMEX) ointment   Topical PRN Antonieta Pert, MD       [MAR Hold] memantine (NAMENDA) tablet 10 mg  10 mg Oral BID Kristopher Oppenheim, DO   10 mg at 09/30/21 2047   [MAR Hold] ondansetron (ZOFRAN) tablet 4 mg  4 mg  Oral Q6H PRN Kristopher Oppenheim, DO       Or   Doug Sou Hold] ondansetron [facility]) injection 4 mg  4 mg Intravenous Q6H PRN Kristopher Oppenheim, DO       [MAR Hold] pantoprazole (PROTONIX) EC tablet 40 mg  40 mg Oral BID Kc, Maren Beach, MD   40 mg at 09/30/21 2047   [facility]  Hold] polyethylene glycol (MIRALAX / GLYCOLAX) packet 34 g  34 g Oral BID Michael Boston, MD   34 g at 09/30/21 1651   [MAR Hold] potassium chloride SA (KLOR-CON M) CR tablet 40 mEq  40 mEq Oral STAT Kc, Maren Beach, MD         Allergies  Allergen Reactions   Codeine Anaphylaxis   Penicillins Anaphylaxis   Bactrim Itching and Swelling   Clarithromycin Other (See Comments)    "Caused skin to peel off my hand."   Flagyl [Metronidazole Hcl] Itching and Swelling   Food Other (See Comments)    EATS WHITE RICE ONLY (ALLERGIC REACTION TO YELLOW OR BROWN RICE THAT RESULTED IN HOSPITALIZATION)   Pineapple Itching and Swelling    SWELLING OF EYES   Sulfa Antibiotics Itching and Swelling    BP 131/81    Pulse 71    Temp 98.7 F (37.1 C) (Oral)    Resp 16    Ht 5\' 2"  (1.575 m)    Wt 59.8 kg    SpO2 100%    BMI 24.11 kg/m   Labs: Results for orders placed or performed during the hospital encounter of 09/26/21 (from the past 48 hour(s))  Surgical pathology     Status: None   Collection Time: 09/29/21  1:29 PM  Result Value Ref Range   SURGICAL PATHOLOGY      SURGICAL PATHOLOGY CASE: [id number] PATIENT: Catelyn [place] Surgical Pathology Report     Clinical History: Hx AVMs, Anemia, Dark stools (kc)     FINAL MICROSCOPIC DIAGNOSIS:  A. COLON, ASCENDING, BIOPSY: - Invasive moderately differentiated adenocarcinoma.  COMMENT:  This case was also reviewed by Dr. Dessie Coma in intradepartmental consultation.  Allan Minotti DESCRIPTION:  Received in formalin are tan, soft tissue fragments that are submitted in toto. Number: 4 size: 0.3-0.5 cm blocks: 1 (GRP 09/29/2021)   Final Diagnosis performed by Allena Napoleon, MD.   Electronically signed 09/30/2021 Technical component performed at [facility], [place] [address] [place], [place] [address].  Professional component performed at [facility]. [address], [place], [place] [address]   Immunohistochemistry Technical component (if applicable) was performed at [facility]. [address] [place], [place] , [place] [address].  IMMUNOHISTOCHEMISTRY DISCLAIMER (if applicable): Some of these immunohistochemical stains may have been developed and the performance characteristics determine by [facility]. Some may not have been cleared or approved by the U.S. Food and Drug Administration. The FDA has determined that such clearance or approval is not necessary. This test is used for clinical purposes. It should not be regarded as investigational or for research. This laboratory is certified under the [place] (CLIA-88) as qualified to perform high complexity clinical laboratory testing.  The controls stained appropriately.   CBC with Differential/Platelet     Status: Abnormal   Collection Time: 09/30/21  8:14 AM  Result Value Ref Range   WBC 9.0 4.0 - 10.5 K/uL   RBC 3.29 (L) 3.87 - 5.11 MIL/uL   Hemoglobin 7.7 (L) 12.0 - 15.0 g/dL    Comment: Reticulocyte Hemoglobin testing  may be clinically indicated, consider ordering this additional test [id number]    HCT 24.0 (L) 36.0 - 46.0 %   MCV 72.9 (L) 80.0 - 100.0 fL   MCH 23.4 (L) 26.0 - 34.0 pg   MCHC 32.1 30.0 - 36.0 g/dL   RDW 24.0 (H) 11.5 - 15.5 %   Platelets 132 (L) 150 - 400 K/uL    Comment: Immature Platelet Fraction may be clinically indicated, consider ordering this additional test [id number]    nRBC 0.0 0.0 - 0.2 %   Neutrophils Relative % 72 %   Neutro Abs 6.5 1.7 - 7.7 K/uL   Lymphocytes Relative 19 %   Lymphs Abs 1.8 0.7 - 4.0 K/uL   Monocytes Relative 8 %   Monocytes Absolute 0.7 0.1 - 1.0 K/uL   Eosinophils Relative 0 %   Eosinophils Absolute 0.0 0.0 - 0.5 K/uL   Basophils Relative 0 %   Basophils Absolute 0.0 0.0 - 0.1 K/uL   Immature Granulocytes 1 %   Abs Immature Granulocytes 0.05 0.00 - 0.07 K/uL   Reactive, Benign  Lymphocytes PRESENT    Target Cells PRESENT     Comment: Performed at [facility], [place] [address], [place], [place] [address]  Basic metabolic panel     Status: Abnormal   Collection Time: 09/30/21  8:14 AM  Result Value Ref Range   Sodium 141 135 - 145 mmol/L   Potassium 3.5 3.5 - 5.1 mmol/L   Chloride 109 98 - 111 mmol/L   CO2 24 22 - 32 mmol/L   Glucose, Bld 114 (H) 70 - 99 mg/dL    Comment: Glucose reference range applies only to samples taken after fasting for at least 8 hours.   BUN 29 (H) 8 - 23 mg/dL   Creatinine, Ser 2.15 (H) 0.44 - 1.00 mg/dL   Calcium 8.7 (L) 8.9 - 10.3 mg/dL   GFR, Estimated 22 (L) >60 mL/min    Comment: (NOTE) Calculated using the CKD-EPI Creatinine Equation (2021)    Anion gap 8 5 - 15    Comment: Performed at [facility], [place] [address] [place], [place] [address]  Phosphorus     Status: None   Collection Time: 09/30/21  8:14 AM  Result Value Ref Range   Phosphorus 2.5 2.5 - 4.6 mg/dL    Comment: Performed at [facility], [place] [address] [place], [place] [address]  Prealbumin     Status: Abnormal   Collection Time: 09/30/21 12:36 PM  Result Value Ref Range   Prealbumin 13.6 (L) 18 - 38 mg/dL    Comment: Performed at [place] [address], [place], [place] [address]  Type and screen [place]     Status: None (Preliminary result)   Collection Time: 09/30/21 12:36 PM  Result Value Ref Range   ABO/RH(D) O POS    Antibody Screen POS    Sample Expiration 10/03/2021,2359    Antibody Identification ANTI E    Unit Number [id number]    Blood Component Type RED CELLS,LR    Unit division 00    Status of Unit ISSUED    Transfusion Status OK TO TRANSFUSE    Crossmatch Result COMPATIBLE    Donor AG Type NEGATIVE FOR E ANTIGEN    Unit Number [id number]    Blood Component Type RED CELLS,LR    Unit division 00    Status of Unit ALLOCATED    Donor AG Type  NEGATIVE FOR E ANTIGEN    Transfusion Status OK TO TRANSFUSE    Crossmatch Result COMPATIBLE   CEA     Status: Abnormal   Collection Time: 09/30/21  3:13 PM  Result Value Ref Range   CEA 470.0 (H) 0.0 - 4.7 ng/mL    Comment: (NOTE)                             Nonsmokers          <3.9                             Smokers             <5.6 [employer] Electrochemiluminescence Immunoassay (ECLIA) Values obtained with different assay methods or kits cannot be used interchangeably.  Results cannot be interpreted as absolute evidence of the presence or absence of malignant disease. Performed At: [facility] [place], [place] [id number] Rush Farmer MD WU:[id number]   Hemoglobin A1c     Status: None   Collection Time: 09/30/21  3:13 PM  Result Value Ref Range   Hgb A1c MFr Bld 4.9 4.8 - 5.6 %    Comment: (NOTE) Pre diabetes:          5.7%-6.4%  Diabetes:              >6.4%  Glycemic control for   <7.0% adults with diabetes    Mean Plasma Glucose 93.93 mg/dL    Comment: Performed at [place] [address] [place], [place] [address]  CBC with Differential/Platelet     Status: Abnormal   Collection Time: 10/01/21  6:00 AM  Result Value Ref Range   WBC 6.2 4.0 - 10.5 K/uL   RBC 2.85 (L) 3.87 - 5.11 MIL/uL   Hemoglobin 6.9 (LL) 12.0 - 15.0 g/dL    Comment: Reticulocyte Hemoglobin testing may be clinically indicated, consider ordering this additional test [id number] THIS CRITICAL RESULT HAS VERIFIED AND BEEN CALLED TO [place],R BY PATRICIA LUZOLO ON 12 30 2022 AT 0706, AND HAS BEEN READ BACK. CRITICAL RESULT VERIFIED    HCT 20.6 (L) 36.0 - 46.0 %   MCV 72.3 (L) 80.0 - 100.0 fL   MCH 24.2 (L) 26.0 - 34.0 pg   MCHC 33.5 30.0 - 36.0 g/dL   RDW 23.9 (H) 11.5 - 15.5 %   Platelets 101 (L) 150 - 400 K/uL    Comment: SPECIMEN CHECKED FOR CLOTS Immature Platelet Fraction may be clinically indicated, consider ordering this additional  test [id number] REPEATED TO VERIFY    nRBC 0.0 0.0 - 0.2 %   Neutrophils Relative % 64 %   Neutro Abs 4.0 1.7 - 7.7 K/uL   Lymphocytes Relative 24 %   Lymphs Abs 1.5 0.7 - 4.0 K/uL   Monocytes Relative 11 %   Monocytes Absolute 0.7 0.1 - 1.0 K/uL   Eosinophils Relative 1 %   Eosinophils Absolute 0.1 0.0 - 0.5 K/uL   Basophils Relative 0 %   Basophils Absolute 0.0 0.0 - 0.1 K/uL   Immature Granulocytes 0 %   Abs Immature Granulocytes 0.02 0.00 - 0.07 K/uL   Reactive, Benign Lymphocytes PRESENT    Target Cells PRESENT     Comment: Performed at [facility], [place] [address] [place], [place] [address]  Basic metabolic panel     Status: Abnormal  Collection Time: 10/01/21  6:00 AM  Result Value Ref Range   Sodium 135 135 - 145 mmol/L   Potassium 3.3 (L) 3.5 - 5.1 mmol/L   Chloride 106 98 - 111 mmol/L   CO2 24 22 - 32 mmol/L   Glucose, Bld 95 70 - 99 mg/dL    Comment: Glucose reference range applies only to samples taken after fasting for at least 8 hours.   BUN 19 8 - 23 mg/dL   Creatinine, Ser 1.53 (H) 0.44 - 1.00 mg/dL   Calcium 8.2 (L) 8.9 - 10.3 mg/dL   GFR, Estimated 34 (L) >60 mL/min    Comment: (NOTE) Calculated using the CKD-EPI Creatinine Equation (2021)    Anion gap 5 5 - 15    Comment: Performed at [facility], [place] [address] [place], [place] [address]  Prepare RBC (crossmatch)     Status: None   Collection Time: 10/01/21  7:19 AM  Result Value Ref Range   Order Confirmation      ORDER PROCESSED BY BLOOD BANK Performed at [facility], [place] [address] [place], [place] [address]     Imaging / Studies: No results found.   Adin Hector, M.D., F.A.C.S. Gastrointestinal and Minimally Invasive Surgery [facility] [address], [place], [place] [address] [phone] Main / Paging  10/01/2021 9:46 AM    Adin Hector

## 2021-10-01 NOTE — Transfer of Care (Signed)
Immediate Anesthesia Transfer of Care Note  Patient: Susan [name]  Procedure(s) Performed: XI ROBOT ASSISTED RIGHT HEMICOLECTOMY, WITH TAP BLOCK, ASSESSMENT OF TISSUE PERFUSION USING FIREFLY (Right)  Patient Location: PACU  Anesthesia Type:General  Level of Consciousness: oriented, drowsy and patient cooperative  Airway & Oxygen Therapy: Patient Spontanous Breathing and Patient connected to face mask oxygen  Post-op Assessment: Report given to RN and Post -op Vital signs reviewed and stable  Post vital signs: Reviewed and stable  Last Vitals:  Vitals Value Taken Time  BP 144/84 10/01/21 1330  Temp    Pulse 70 10/01/21 1332  Resp 18 10/01/21 1332  SpO2 100 % 10/01/21 1332  Vitals shown include unvalidated device data.  Last Pain:  Vitals:   10/01/21 1014  TempSrc: Oral  PainSc:          Complications: No notable events documented.

## 2021-10-01 NOTE — Discharge Instructions (Signed)
SURGERY: POST OP INSTRUCTIONS (Surgery for small bowel obstruction, colon resection, etc)   ######################################################################  EAT Gradually transition to a high fiber diet with a fiber supplement over the next few days after discharge  WALK Walk an hour a day.  Control your pain to do that.    CONTROL PAIN Control pain so that you can walk, sleep, tolerate sneezing/coughing, go up/down stairs.  HAVE A BOWEL MOVEMENT DAILY Keep your bowels regular to avoid problems.  OK to try a laxative to override constipation.  OK to use an antidairrheal to slow down diarrhea.  Call if not better after 2 tries  CALL IF YOU HAVE PROBLEMS/CONCERNS Call if you are still struggling despite following these instructions. Call if you have concerns not answered by these instructions  ######################################################################   DIET Follow a light diet the first few days at home.  Start with a bland diet such as soups, liquids, starchy foods, low fat foods, etc.  If you feel full, bloated, or constipated, stay on a ful liquid or pureed/blenderized diet for a few days until you feel better and no longer constipated. Be sure to drink plenty of fluids every day to avoid getting dehydrated (feeling dizzy, not urinating, etc.). Gradually add a fiber supplement to your diet over the next week.  Gradually get back to a regular solid diet.  Avoid fast food or heavy meals the first week as you are more likely to get nauseated. It is expected for your digestive tract to need a few months to get back to normal.  It is common for your bowel movements and stools to be irregular.  You will have occasional bloating and cramping that should eventually fade away.  Until you are eating solid food normally, off all pain medications, and back to regular activities; your bowels will not be normal. Focus on eating a low-fat, high fiber diet the rest of your life  (See Getting to Rodeo, below).  CARE of your INCISION or WOUND It is good for closed incision and even open wounds to be washed every day.  Shower every day.  Short baths are fine.  Wash the incisions and wounds clean with soap & water.     If you have a closed incision(s), wash the incision with soap & water every day.  You may leave closed incisions open to air if it is dry.   You may cover the incision with clean gauze & replace it after your daily shower for comfort.  It is good for closed incisions and even open wounds to be washed every day.  Shower every day.  Short baths are fine.  Wash the incisions and wounds clean with soap & water.    You may leave closed incisions open to air if it is dry.   You may cover the incision with clean gauze & replace it after your daily shower for comfort.  TEGADERM:  You have clear gauze band-aid dressings over your closed incision(s).  Remove the dressings 3 days after surgery.   If you have an open wound with a wound vac, see wound vac care instructions.     ACTIVITIES as tolerated Start light daily activities --- self-care, walking, climbing stairs-- beginning the day after surgery.  Gradually increase activities as tolerated.  Control your pain to be active.  Stop when you are tired.  Ideally, walk several times a day, eventually an hour a day.   Most people are back to most day-to-day activities in  a few weeks.  It takes 4-8 weeks to get back to unrestricted, intense activity. If you can walk 30 minutes without difficulty, it is safe to try more intense activity such as jogging, treadmill, bicycling, low-impact aerobics, swimming, etc. Save the most intensive and strenuous activity for last (Usually 4-8 weeks after surgery) such as sit-ups, heavy lifting, contact sports, etc.  Refrain from any intense heavy lifting or straining until you are off narcotics for pain control.  You will have off days, but things should improve  week-by-week. DO NOT PUSH THROUGH PAIN.  Let pain be your guide: If it hurts to do something, don't do it.  Pain is your body warning you to avoid that activity for another week until the pain goes down. You may drive when you are no longer taking narcotic prescription pain medication, you can comfortably wear a seatbelt, and you can safely make sudden turns/stops to protect yourself without hesitating due to pain. You may have sexual intercourse when it is comfortable. If it hurts to do something, stop.  MEDICATIONS Take your usually prescribed home medications unless otherwise directed.   Blood thinners:  Usually you can restart any strong blood thinners after the second postoperative day.  It is OK to take aspirin right away.     If you are on strong blood thinners (warfarin/Coumadin, Plavix, Xerelto, Eliquis, Pradaxa, etc), discuss with your surgeon, medicine PCP, and/or cardiologist for instructions on when to restart the blood thinner & if blood monitoring is needed (PT/INR blood check, etc).     PAIN CONTROL Pain after surgery or related to activity is often due to strain/injury to muscle, tendon, nerves and/or incisions.  This pain is usually short-term and will improve in a few months.  To help speed the process of healing and to get back to regular activity more quickly, DO THE FOLLOWING THINGS TOGETHER: Increase activity gradually.  DO NOT PUSH THROUGH PAIN Use Ice and/or Heat Try Gentle Massage and/or Stretching Take over the counter pain medication Take Narcotic prescription pain medication for more severe pain  Good pain control = faster recovery.  It is better to take more medicine to be more active than to stay in bed all day to avoid medications.  Increase activity gradually Avoid heavy lifting at first, then increase to lifting as tolerated over the next 6 weeks. Do not push through the pain.  Listen to your body and avoid positions and maneuvers than reproduce the pain.   Wait a few days before trying something more intense Walking an hour a day is encouraged to help your body recover faster and more safely.  Start slowly and stop when getting sore.  If you can walk 30 minutes without stopping or pain, you can try more intense activity (running, jogging, aerobics, cycling, swimming, treadmill, sex, sports, weightlifting, etc.) Remember: If it hurts to do it, then dont do it! Use Ice and/or Heat You will have swelling and bruising around the incisions.  This will take several weeks to resolve. Ice packs or heating pads (6-8 times a day, 30-60 minutes at a time) will help sooth soreness & bruising. Some people prefer to use ice alone, heat alone, or alternate between ice & heat.  Experiment and see what works best for you.  Consider trying ice for the first few days to help decrease swelling and bruising; then, switch to heat to help relax sore spots and speed recovery. Shower every day.  Short baths are fine.  It feels good!  Keep the incisions and wounds clean with soap & water.   Try Gentle Massage and/or Stretching Massage at the area of pain many times a day Stop if you feel pain - do not overdo it Take over the counter pain medication This helps the muscle and nerve tissues become less irritable and calm down faster Choose ONE of the following over-the-counter anti-inflammatory medications: Acetaminophen 500mg  tabs (Tylenol) 1-2 pills with every meal and just before bedtime (avoid if you have liver problems or if you have acetaminophen in you narcotic prescription) Naproxen 220mg  tabs (ex. Aleve, Naprosyn) 1-2 pills twice a day (avoid if you have kidney, stomach, IBD, or bleeding problems) Ibuprofen 200mg  tabs (ex. Advil, Motrin) 3-4 pills with every meal and just before bedtime (avoid if you have kidney, stomach, IBD, or bleeding problems) Take with food/snack several times a day as directed for at least 2 weeks to help keep pain / soreness down & more  manageable. Take Narcotic prescription pain medication for more severe pain A prescription for strong pain control is often given to you upon discharge (for example: oxycodone/Percocet, hydrocodone/Norco/Vicodin, or tramadol/Ultram) Take your pain medication as prescribed. Be mindful that most narcotic prescriptions contain Tylenol (acetaminophen) as well - avoid taking too much Tylenol. If you are having problems/concerns with the prescription medicine (does not control pain, nausea, vomiting, rash, itching, etc.), please call us [phone] to see if we need to switch you to a different pain medicine that will work better for you and/or control your side effects better. If you need a refill on your pain medication, you must call the office before 4 pm and on weekdays only.  By federal law, prescriptions for narcotics cannot be called into a pharmacy.  They must be filled out on paper & picked up from our office by the patient or authorized caretaker.  Prescriptions cannot be filled after 4 pm nor on weekends.    WHEN TO CALL us [phone] Severe uncontrolled or worsening pain  Fever over 101 F (38.5 C) Concerns with the incision: Worsening pain, redness, rash/hives, swelling, bleeding, or drainage Reactions / problems with new medications (itching, rash, hives, nausea, etc.) Nausea and/or vomiting Difficulty urinating Difficulty breathing Worsening fatigue, dizziness, lightheadedness, blurred vision Other concerns If you are not getting better after two weeks or are noticing you are getting worse, contact our office [phone] for further advice.  We may need to adjust your medications, re-evaluate you in the office, send you to the emergency room, or see what other things we can do to help. The clinic staff is available to answer your questions during regular business hours (8:30am-5pm).  Please dont hesitate to call and ask to speak to one of our nurses for clinical concerns.    A  surgeon from [facility] is always on call at the hospitals 24 hours/day If you have a medical emergency, go to the nearest emergency room or call 911.  FOLLOW UP in our office One the day of your discharge from the hospital (or the next business weekday), please call [facility] to set up or confirm an appointment to see your surgeon in the office for a follow-up appointment.  Usually it is 2-3 weeks after your surgery.   If you have skin staples at your incision(s), let the office know so we can set up a time in the office for the nurse to remove them (usually around 10 days after surgery). Make sure that you call for  appointments the day of discharge (or the next business weekday) from the hospital to ensure a convenient appointment time. IF YOU HAVE DISABILITY OR FAMILY LEAVE FORMS, BRING THEM TO THE OFFICE FOR PROCESSING.  DO NOT GIVE THEM TO YOUR DOCTOR.  [facility] [address], [place], [place], [place]  [address] ? [phone] - Main [phone] - [place],  [phone] - Fax [email or web address]  GETTING TO GOOD BOWEL HEALTH. It is expected for your digestive tract to need a few months to get back to normal.  It is common for your bowel movements and stools to be irregular.  You will have occasional bloating and cramping that should eventually fade away.  Until you are eating solid food normally, off all pain medications, and back to regular activities; your bowels will not be normal.   Avoiding constipation The goal: ONE SOFT BOWEL MOVEMENT A DAY!    Drink plenty of fluids.  Choose water first. TAKE A FIBER SUPPLEMENT EVERY DAY THE REST OF YOUR LIFE During your first week back home, gradually add back a fiber supplement every day Experiment which form you can tolerate.   There are many forms such as powders, tablets, wafers, gummies, etc Psyllium bran (Metamucil), methylcellulose (Citrucel), Miralax or Glycolax,  Benefiber, Flax Seed.  Adjust the dose week-by-week (1/2 dose/day to 6 doses a day) until you are moving your bowels 1-2 times a day.  Cut back the dose or try a different fiber product if it is giving you problems such as diarrhea or bloating. Sometimes a laxative is needed to help jump-start bowels if constipated until the fiber supplement can help regulate your bowels.  If you are tolerating eating & you are farting, it is okay to try a gentle laxative such as double dose MiraLax, prune juice, or Milk of Magnesia.  Avoid using laxatives too often. Stool softeners can sometimes help counteract the constipating effects of narcotic pain medicines.  It can also cause diarrhea, so avoid using for too long. If you are still constipated despite taking fiber daily, eating solids, and a few doses of laxatives, call our office. Controlling diarrhea Try drinking liquids and eating bland foods for a few days to avoid stressing your intestines further. Avoid dairy products (especially milk & ice cream) for a short time.  The intestines often can lose the ability to digest lactose when stressed. Avoid foods that cause gassiness or bloating.  Typical foods include beans and other legumes, cabbage, broccoli, and dairy foods.  Avoid greasy, spicy, fast foods.  Every person has some sensitivity to other foods, so listen to your body and avoid those foods that trigger problems for you. Probiotics (such as active yogurt, Align, etc) may help repopulate the intestines and colon with normal bacteria and calm down a sensitive digestive tract Adding a fiber supplement gradually can help thicken stools by absorbing excess fluid and retrain the intestines to act more normally.  Slowly increase the dose over a few weeks.  Too much fiber too soon can backfire and cause cramping & bloating. It is okay to try and slow down diarrhea with a few doses of antidiarrheal medicines.   Bismuth subsalicylate (ex. Kayopectate, Pepto Bismol)  for a few doses can help control diarrhea.  Avoid if pregnant.   Loperamide (Imodium) can slow down diarrhea.  Start with one tablet (2mg ) first.  Avoid if you are having fevers or severe pain.  ILEOSTOMY PATIENTS WILL HAVE CHRONIC DIARRHEA since their colon is  not in use.    Drink plenty of liquids.  You will need to drink even more glasses of water/liquid a day to avoid getting dehydrated. Record output from your ileostomy.  Expect to empty the bag every 3-4 hours at first.  Most people with a permanent ileostomy empty their bag 4-6 times at the least.   Use antidiarrheal medicine (especially Imodium) several times a day to avoid getting dehydrated.  Start with a dose at bedtime & breakfast.  Adjust up or down as needed.  Increase antidiarrheal medications as directed to avoid emptying the bag more than 8 times a day (every 3 hours). Work with your wound ostomy nurse to learn care for your ostomy.  See ostomy care instructions. TROUBLESHOOTING IRREGULAR BOWELS 1) Start with a soft & bland diet. No spicy, greasy, or fried foods.  2) Avoid gluten/wheat or dairy products from diet to see if symptoms improve. 3) Miralax 17gm or flax seed mixed in [place]. water or juice-daily. May use 2-4 times a day as needed. 4) Gas-X, Phazyme, etc. as needed for gas & bloating.  5) Prilosec (omeprazole) over-the-counter as needed 6)  Consider probiotics (Align, Activa, etc) to help calm the bowels down  Call your doctor if you are getting worse or not getting better.  Sometimes further testing (cultures, endoscopy, X-ray studies, CT scans, bloodwork, etc.) may be needed to help diagnose and treat the cause of the diarrhea. [facility], [place], [place], [place], [place]  [address] [phone] - Main.    [phone]  - Toll Free.   [phone] - Fax [email or web address]

## 2021-10-02 DIAGNOSIS — K4090 Unilateral inguinal hernia, without obstruction or gangrene, not specified as recurrent: Secondary | ICD-10-CM

## 2021-10-02 LAB — BASIC METABOLIC PANEL
Anion gap: 9 (ref 5–15)
BUN: 16 mg/dL (ref 8–23)
CO2: 20 mmol/L — ABNORMAL LOW (ref 22–32)
Calcium: 7.6 mg/dL — ABNORMAL LOW (ref 8.9–10.3)
Chloride: 107 mmol/L (ref 98–111)
Creatinine, Ser: 1.27 mg/dL — ABNORMAL HIGH (ref 0.44–1.00)
GFR, Estimated: 42 mL/min — ABNORMAL LOW (ref >60)
Glucose, Bld: 144 mg/dL — ABNORMAL HIGH (ref 70–99)
Potassium: 4 mmol/L (ref 3.5–5.1)
Sodium: 136 mmol/L (ref 135–145)

## 2021-10-02 LAB — CBC WITH DIFFERENTIAL/PLATELET
Abs Immature Granulocytes: 0.09 10*3/uL — ABNORMAL HIGH (ref 0.00–0.07)
Basophils Absolute: 0 10*3/uL (ref 0.0–0.1)
Basophils Relative: 0 %
Eosinophils Absolute: 0 10*3/uL (ref 0.0–0.5)
Eosinophils Relative: 0 %
HCT: 22.4 % — ABNORMAL LOW (ref 36.0–46.0)
Hemoglobin: 7.4 g/dL — ABNORMAL LOW (ref 12.0–15.0)
Immature Granulocytes: 1 %
Lymphocytes Relative: 4 %
Lymphs Abs: 0.6 10*3/uL — ABNORMAL LOW (ref 0.7–4.0)
MCH: 25.2 pg — ABNORMAL LOW (ref 26.0–34.0)
MCHC: 33 g/dL (ref 30.0–36.0)
MCV: 76.2 fL — ABNORMAL LOW (ref 80.0–100.0)
Monocytes Absolute: 0.9 10*3/uL (ref 0.1–1.0)
Monocytes Relative: 7 %
Neutro Abs: 12.7 10*3/uL — ABNORMAL HIGH (ref 1.7–7.7)
Neutrophils Relative %: 88 %
Platelets: 86 10*3/uL — ABNORMAL LOW (ref 150–400)
RBC: 2.94 MIL/uL — ABNORMAL LOW (ref 3.87–5.11)
RDW: 20.7 % — ABNORMAL HIGH (ref 11.5–15.5)
WBC: 14.3 10*3/uL — ABNORMAL HIGH (ref 4.0–10.5)
nRBC: 0 % (ref 0.0–0.2)

## 2021-10-02 MED ORDER — ENSURE SURGERY PO LIQD
237.0000 mL | Freq: Two times a day (BID) | ORAL | Status: DC
  Administered 2021-10-02 – 2021-10-08 (×12): 237 mL via ORAL
  Filled 2021-10-02 (×2): qty 237

## 2021-10-02 MED ORDER — CALCIUM POLYCARBOPHIL 625 MG PO TABS
625.0000 mg | ORAL_TABLET | Freq: Two times a day (BID) | ORAL | Status: DC
  Administered 2021-10-02 – 2021-10-08 (×13): 625 mg via ORAL
  Filled 2021-10-02 (×14): qty 1

## 2021-10-02 MED ORDER — FENTANYL CITRATE PF 50 MCG/ML IJ SOSY: 12.5000 ug | PREFILLED_SYRINGE | INTRAMUSCULAR | Status: DC | PRN

## 2021-10-02 MED ORDER — ALVIMOPAN 12 MG PO CAPS: 12.0000 mg | ORAL_CAPSULE | Freq: Two times a day (BID) | ORAL | Status: DC

## 2021-10-02 MED ORDER — ALUM & MAG HYDROXIDE-SIMETH 200-200-20 MG/5ML PO SUSP: 30.0000 mL | Freq: Four times a day (QID) | ORAL | Status: DC | PRN

## 2021-10-02 MED ORDER — MAGIC MOUTHWASH
15.0000 mL | Freq: Four times a day (QID) | ORAL | Status: DC | PRN
  Filled 2021-10-02: qty 15

## 2021-10-02 MED ORDER — ACETAMINOPHEN 500 MG PO TABS
1000.0000 mg | ORAL_TABLET | Freq: Three times a day (TID) | ORAL | Status: DC
  Administered 2021-10-02 – 2021-10-08 (×15): 1000 mg via ORAL
  Filled 2021-10-02 (×18): qty 2

## 2021-10-02 MED ORDER — MENTHOL 3 MG MT LOZG: 1.0000 | LOZENGE | OROMUCOSAL | Status: DC | PRN

## 2021-10-02 MED ORDER — LIP MEDEX EX OINT: 1.0000 "application " | TOPICAL_OINTMENT | Freq: Two times a day (BID) | CUTANEOUS | Status: DC

## 2021-10-02 MED ORDER — GABAPENTIN 100 MG PO CAPS
200.0000 mg | ORAL_CAPSULE | Freq: Three times a day (TID) | ORAL | Status: DC
  Administered 2021-10-02 – 2021-10-08 (×19): 200 mg via ORAL
  Filled 2021-10-02 (×19): qty 2

## 2021-10-02 MED ORDER — PHENOL 1.4 % MT LIQD: 2.0000 | OROMUCOSAL | Status: DC | PRN

## 2021-10-02 NOTE — Progress Notes (Addendum)
Susan [name] [id number] Jun 03, 1941  CARE TEAM:  PCP: Sonia Side., [name]  Outpatient Care Team: Patient Care Team: Sonia Side., [name] as PCP - General (Family Medicine) Alen Blew, Mathis Dad, MD (Hematology and Oncology) Inda Castle, MD (Inactive) as Consulting Physician (Gastroenterology) Malena Catholic, MD as Referring Physician Monna Fam, MD as Consulting Physician (Ophthalmology)  Inpatient Treatment Team: Treatment Team: Attending Provider: Antonieta Pert, MD; Rounding Team: Joycelyn Das, MD; Consulting Physician: Edison Pace, Md, MD; Charge Nurse: Kathy Breach, RN; Registered Nurse: Francesco Runner, RN; Technician: Towanda Malkin, NT; Technician: Annitta Jersey, NT; Occupational Therapist: Lenward Chancellor, OT; Physical Therapist: Fuller Mandril, PT; Registered Nurse: Elie Goody, RN; Utilization Review: Alease Medina, RN   Problem List:   Principal Problem:   Cancer of ascending colon [facility]) Active Problems:   GI bleeding   Dementia without behavioral disturbance [facility])   Essential hypertension   Hypothyroidism   GERD (gastroesophageal reflux disease)   CKD, 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  Some attachment to Gerota's fascia of right kidney so removed en bloc.  Otherwise no major adhesions.  Suspect partially obstructing.   No obvious metastatic disease  on visceral parietal peritoneum or liver.   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  Assessment  Recovering OK so far  [facility] Stay = 5 days)  Plan: -Probably okay to transfer to floor since no major events overnight.  -Post colectomy ERAS pathway  -Dysphagia 1 diet to see if that helps with swallowing.  Hold off on advancing today.  -Follow with ML IV fluid.  ERAS collect through protocol usually is to keep on the dry side.  Follow creatinine as needed IV fluid boluses as needed.  Looks like primary service wants to keep her on the dry side as well with dose of Lasix and given an advanced age from a cardiopulmonary standpoint  -Acute blood loss anemia from bleeding tumor in the setting most likely of anemia of chronic disease.  May restart oral iron but hold off for now and follow.  -Discontinue Foley catheter per protocol  -VTE prophylaxis- SCDs, etc. okay to do Lovenox but follow hemoglobin every day.  Hold if less than 7 or hypotensive  -Follow mental status with history of dementia.  Resume Aricept, etc.  Other multiple medical problems per primary service  -mobilize as tolerated to help recovery  Disposition:  Disposition:  The patient is from: Home  Anticipate discharge to:  Home with Home Health  Anticipated Date of Discharge is:  January 3,2023    Barriers to discharge:  Pending Clinical improvement (more likely than not)  Patient currently is NOT MEDICALLY STABLE for discharge from the hospital from a surgery standpoint.      25 minutes  spent in review, evaluation, examination, counseling, and coordination of care.   I have reviewed this patient's available data, including medical history, events of note, physical examination and test results as part of my evaluation.  A significant portion of that time was spent in counseling.  Care during the described  time interval was provided by me.  10/02/2021    Subjective: (Chief complaint)  No major events in stepdown unit.  Nursing just outside room.  Some soreness in the right upper quadrant.  Robaxin helped.  Tolerating some liquids but no great appetite.  Objective:  Vital signs:  Vitals:   10/02/21 0300 10/02/21 0400 10/02/21 0500 10/02/21 0600  BP: 132/80 121/61 (!) 108/56 (!) 116/55  Pulse: 73  68   Resp: (!) 21 20 16 16   Temp:  98.1 F (36.7 C)    TempSrc:  Oral    SpO2: 99% 99% 100% 100%  Weight:   56.5 kg   Height:        Last BM Date: 09/30/21  Intake/Output   Yesterday:  12/30 0701 - 12/31 0700 In: 3619.6 [I.V.:2205.2; Blood:1256.7; IV Piggyback:157.8] Out: 4742 [Urine:1135; Blood:50] This shift:  No intake/output data recorded.  Bowel function:  Flatus: No  BM:  No  Drain: (No drain)   Physical Exam:  General: Pt awake/alert in no acute distress Eyes: PERRL, normal EOM.  Sclera clear.  No icterus Neuro: CN II-XII intact w/o focal sensory/motor deficits. Lymph: No head/neck/groin lymphadenopathy Psych:  No delerium/psychosis/paranoia.  Oriented x 4 HENT: Normocephalic, Mucus membranes moist.  No thrush Neck: Supple, No tracheal deviation.  No obvious thyromegaly Chest: No pain to chest wall compression.  Good respiratory excursion.  No audible wheezing CV:  Pulses intact.  Regular rhythm.  No major extremity edema MS: Normal AROM mjr joints.  No obvious deformity  Abdomen: Soft.  Nondistended.  Tenderness at right upper quadrant mild without Murphy sign or guarding..  Dressings clean dry and intact .  No evidence of peritonitis.  No incarcerated hernias.  GU: Left direct space inguinal hernia soft and reducible.  Mildly sensitive Ext:   No deformity.  No mjr edema.  No cyanosis Skin: No petechiae / purpurea.  No major sores.  Warm and dry    Results:   Cultures: Recent Results (from the past 720 hour(s))  Resp Panel by RT-PCR (Flu A&B,  Covid) Nasopharyngeal Swab     Status: None   Collection Time: 09/26/21  7:12 PM   Specimen: Nasopharyngeal Swab; Nasopharyngeal(NP) swabs in vial transport medium  Result Value Ref Range Status   SARS Coronavirus 2 by RT PCR NEGATIVE NEGATIVE Final    Comment: (NOTE) SARS-CoV-2 target nucleic acids are NOT DETECTED.  The SARS-CoV-2 RNA is generally detectable in upper respiratory specimens during the acute phase of infection. The lowest concentration of SARS-CoV-2 viral copies this assay can detect is 138 copies/mL. A negative result does not preclude SARS-Cov-2 infection and should not be used as the sole basis for treatment or other patient management decisions. A negative result may occur with  improper specimen collection/handling, submission of specimen other than nasopharyngeal swab, presence of viral mutation(s) within the areas targeted by this assay, and inadequate number of viral copies(<138 copies/mL). A negative result must be combined with clinical observations, patient history, and epidemiological information. The expected result is Negative.  Fact Sheet for Patients:  EntrepreneurPulse.com.au  Fact Sheet for Healthcare Providers:  IncredibleEmployment.be  This test is no t yet approved or cleared by the [place]  States FDA and  has been authorized for detection and/or diagnosis of SARS-CoV-2 by FDA under an Emergency Use Authorization (EUA). This EUA will remain  in effect (meaning this test can be used) for the duration of the COVID-19 declaration under Section 564(b)(1) of the Act, 21 U.S.C.section 360bbb-3(b)(1), unless the authorization is terminated  or revoked sooner.       Influenza A by PCR NEGATIVE NEGATIVE Final   Influenza B by PCR NEGATIVE NEGATIVE Final    Comment: (NOTE) The Xpert Xpress SARS-CoV-2/FLU/RSV plus assay is intended as an aid in the diagnosis of influenza from Nasopharyngeal swab specimens and should  not be used as a sole basis for treatment. Nasal washings and aspirates are unacceptable for Xpert Xpress SARS-CoV-2/FLU/RSV testing.  Fact Sheet for Patients: EntrepreneurPulse.com.au  Fact Sheet for Healthcare Providers: IncredibleEmployment.be  This test is not yet approved or cleared by the [place] FDA and has been authorized for detection and/or diagnosis of SARS-CoV-2 by FDA under an Emergency Use Authorization (EUA). This EUA will remain in effect (meaning this test can be used) for the duration of the COVID-19 declaration under Section 564(b)(1) of the Act, 21 U.S.C. section 360bbb-3(b)(1), unless the authorization is terminated or revoked.  Performed at [facility], [place] [address] [place], [place] [address]   Urine Culture     Status: Abnormal   Collection Time: 09/27/21  9:08 AM   Specimen: Urine, Clean Catch  Result Value Ref Range Status   Specimen Description   Final    URINE, CLEAN CATCH Performed at [facility], [place] [address], [place], [place] [address]    Special Requests   Final    NONE Performed at [facility], [place] [address] [place], [place] [address]    Culture (A)  Final    30,000 COLONIES/mL STREPTOCOCCUS AGALACTIAE TESTING AGAINST S. AGALACTIAE NOT ROUTINELY PERFORMED DUE TO PREDICTABILITY OF AMP/PEN/VAN SUSCEPTIBILITY. Performed at [facility], [place] [address] [place], [place] [address]    Report Status 09/28/2021 FINAL  Final  MRSA Next Gen by PCR, Nasal     Status: None   Collection Time: 10/01/21  6:19 PM   Specimen: Nasal Mucosa; Nasal Swab  Result Value Ref Range Status   MRSA by PCR Next Gen NOT DETECTED NOT DETECTED Final    Comment: (NOTE) The GeneXpert MRSA Assay (FDA approved for NASAL specimens only), is one component of a comprehensive MRSA colonization surveillance program. It is not intended to diagnose MRSA infection nor  to guide or monitor treatment for MRSA infections. Test performance is not FDA approved in patients less than 45 years old. Performed at [facility], [place] [address], [place], [place] [address]     Labs: Results for orders placed or performed during the hospital encounter of 09/26/21 (from the past 48 hour(s))  CBC with Differential/Platelet     Status: Abnormal   Collection Time: 09/30/21  8:14 AM  Result Value Ref Range   WBC 9.0 4.0 - 10.5 K/uL   RBC 3.29 (L) 3.87 - 5.11 MIL/uL   Hemoglobin 7.7 (L) 12.0 - 15.0 g/dL    Comment: Reticulocyte Hemoglobin testing may be clinically indicated, consider ordering this additional test [id number]    HCT 24.0 (L) 36.0 - 46.0 %   MCV 72.9 (L) 80.0 - 100.0 fL   MCH 23.4 (L) 26.0 - 34.0 pg   MCHC 32.1 30.0 - 36.0 g/dL   RDW 24.0 (H) 11.5 -  15.5 %   Platelets 132 (L) 150 - 400 K/uL    Comment: Immature Platelet Fraction may be clinically indicated, consider ordering this additional test [id number]    nRBC 0.0 0.0 - 0.2 %   Neutrophils Relative % 72 %   Neutro Abs 6.5 1.7 - 7.7 K/uL   Lymphocytes Relative 19 %   Lymphs Abs 1.8 0.7 - 4.0 K/uL   Monocytes Relative 8 %   Monocytes Absolute 0.7 0.1 - 1.0 K/uL   Eosinophils Relative 0 %   Eosinophils Absolute 0.0 0.0 - 0.5 K/uL   Basophils Relative 0 %   Basophils Absolute 0.0 0.0 - 0.1 K/uL   Immature Granulocytes 1 %   Abs Immature Granulocytes 0.05 0.00 - 0.07 K/uL   Reactive, Benign Lymphocytes PRESENT    Target Cells PRESENT     Comment: Performed at [facility], [place] [address] [place], [place] [address]  Basic metabolic panel     Status: Abnormal   Collection Time: 09/30/21  8:14 AM  Result Value Ref Range   Sodium 141 135 - 145 mmol/L   Potassium 3.5 3.5 - 5.1 mmol/L   Chloride 109 98 - 111 mmol/L   CO2 24 22 - 32 mmol/L   Glucose, Bld 114 (H) 70 - 99 mg/dL    Comment: Glucose reference range applies only to samples taken after fasting  for at least 8 hours.   BUN 29 (H) 8 - 23 mg/dL   Creatinine, Ser 2.15 (H) 0.44 - 1.00 mg/dL   Calcium 8.7 (L) 8.9 - 10.3 mg/dL   GFR, Estimated 22 (L) >60 mL/min    Comment: (NOTE) Calculated using the CKD-EPI Creatinine Equation (2021)    Anion gap 8 5 - 15    Comment: Performed at [facility], [place] [address], [place], [place] [address]  Phosphorus     Status: None   Collection Time: 09/30/21  8:14 AM  Result Value Ref Range   Phosphorus 2.5 2.5 - 4.6 mg/dL    Comment: Performed at [facility], [place] [address] [place], [place] [address]  Prealbumin     Status: Abnormal   Collection Time: 09/30/21 12:36 PM  Result Value Ref Range   Prealbumin 13.6 (L) 18 - 38 mg/dL    Comment: Performed at [place] [address], [place], [place] [address]  Type and screen [place]     Status: None (Preliminary result)   Collection Time: 09/30/21 12:36 PM  Result Value Ref Range   ABO/RH(D) O POS    Antibody Screen POS    Sample Expiration 10/03/2021,2359    Antibody Identification ANTI E    Unit Number [id number]    Blood Component Type RED CELLS,LR    Unit division 00    Status of Unit ISSUED    Transfusion Status OK TO TRANSFUSE    Crossmatch Result COMPATIBLE    Donor AG Type NEGATIVE FOR E ANTIGEN    Unit Number [id number]    Blood Component Type RED CELLS,LR    Unit division 00    Status of Unit ISSUED    Donor AG Type NEGATIVE FOR E ANTIGEN    Transfusion Status OK TO TRANSFUSE    Crossmatch Result COMPATIBLE   CEA     Status: Abnormal   Collection Time: 09/30/21  3:13 PM  Result Value Ref Range   CEA 470.0 (H) 0.0 - 4.7 ng/mL    Comment: (NOTE)  Nonsmokers          <3.9                             Smokers             <5.6 Roche [employer]ostics Electrochemiluminescence Immunoassay (ECLIA) Values obtained with different assay methods or kits cannot be used interchangeably.   Results cannot be interpreted as absolute evidence of the presence or absence of malignant disease. Performed At: [facility] [place], [place] [id number] Rush Farmer MD HW:[id number]   Hemoglobin A1c     Status: None   Collection Time: 09/30/21  3:13 PM  Result Value Ref Range   Hgb A1c MFr Bld 4.9 4.8 - 5.6 %    Comment: (NOTE) Pre diabetes:          5.7%-6.4%  Diabetes:              >6.4%  Glycemic control for   <7.0% adults with diabetes    Mean Plasma Glucose 93.93 mg/dL    Comment: Performed at [place] [address], [place], [place] [address]  CBC with Differential/Platelet     Status: Abnormal   Collection Time: 10/01/21  6:00 AM  Result Value Ref Range   WBC 6.2 4.0 - 10.5 K/uL   RBC 2.85 (L) 3.87 - 5.11 MIL/uL   Hemoglobin 6.9 (LL) 12.0 - 15.0 g/dL    Comment: Reticulocyte Hemoglobin testing may be clinically indicated, consider ordering this additional test [id number] THIS CRITICAL RESULT HAS VERIFIED AND BEEN CALLED TO [place],R BY PATRICIA LUZOLO ON 12 30 2022 AT 0706, AND HAS BEEN READ BACK. CRITICAL RESULT VERIFIED    HCT 20.6 (L) 36.0 - 46.0 %   MCV 72.3 (L) 80.0 - 100.0 fL   MCH 24.2 (L) 26.0 - 34.0 pg   MCHC 33.5 30.0 - 36.0 g/dL   RDW 23.9 (H) 11.5 - 15.5 %   Platelets 101 (L) 150 - 400 K/uL    Comment: SPECIMEN CHECKED FOR CLOTS Immature Platelet Fraction may be clinically indicated, consider ordering this additional test [id number] REPEATED TO VERIFY    nRBC 0.0 0.0 - 0.2 %   Neutrophils Relative % 64 %   Neutro Abs 4.0 1.7 - 7.7 K/uL   Lymphocytes Relative 24 %   Lymphs Abs 1.5 0.7 - 4.0 K/uL   Monocytes Relative 11 %   Monocytes Absolute 0.7 0.1 - 1.0 K/uL   Eosinophils Relative 1 %   Eosinophils Absolute 0.1 0.0 - 0.5 K/uL   Basophils Relative 0 %   Basophils Absolute 0.0 0.0 - 0.1 K/uL   Immature Granulocytes 0 %   Abs Immature Granulocytes 0.02 0.00 - 0.07 K/uL   Reactive, Benign Lymphocytes  PRESENT    Target Cells PRESENT     Comment: Performed at [facility], [place] [address], [place], [place] [address]  Basic metabolic panel     Status: Abnormal   Collection Time: 10/01/21  6:00 AM  Result Value Ref Range   Sodium 135 135 - 145 mmol/L   Potassium 3.3 (L) 3.5 - 5.1 mmol/L   Chloride 106 98 - 111 mmol/L   CO2 24 22 - 32 mmol/L   Glucose, Bld 95 70 - 99 mg/dL    Comment: Glucose reference range applies only to samples taken after fasting for at least 8 hours.   BUN 19 8 - 23 mg/dL  Creatinine, Ser 1.53 (H) 0.44 - 1.00 mg/dL   Calcium 8.2 (L) 8.9 - 10.3 mg/dL   GFR, Estimated 34 (L) >60 mL/min    Comment: (NOTE) Calculated using the CKD-EPI Creatinine Equation (2021)    Anion gap 5 5 - 15    Comment: Performed at [facility], [place] [address], [place], [place] [address]  Prepare RBC (crossmatch)     Status: None   Collection Time: 10/01/21  7:19 AM  Result Value Ref Range   Order Confirmation      ORDER PROCESSED BY BLOOD BANK Performed at [facility], [place] [address] [place], [place] [address]   MRSA Next Gen by PCR, Nasal     Status: None   Collection Time: 10/01/21  6:19 PM   Specimen: Nasal Mucosa; Nasal Swab  Result Value Ref Range   MRSA by PCR Next Gen NOT DETECTED NOT DETECTED    Comment: (NOTE) The GeneXpert MRSA Assay (FDA approved for NASAL specimens only), is one component of a comprehensive MRSA colonization surveillance program. It is not intended to diagnose MRSA infection nor to guide or monitor treatment for MRSA infections. Test performance is not FDA approved in patients less than 53 years old. Performed at [facility], [place] [address] [place], [place] [address]   Hemoglobin and hematocrit, blood     Status: Abnormal   Collection Time: 10/01/21  7:32 PM  Result Value Ref Range   Hemoglobin 10.1 (L) 12.0 - 15.0 g/dL    Comment: POST TRANSFUSION SPECIMEN TRANS     HCT 31.3 (L) 36.0 - 46.0 %    Comment: Performed at [facility], [place] [address], [place], [place] [address]  CBC with Differential/Platelet     Status: Abnormal   Collection Time: 10/02/21  2:55 AM  Result Value Ref Range   WBC 14.3 (H) 4.0 - 10.5 K/uL   RBC 2.94 (L) 3.87 - 5.11 MIL/uL   Hemoglobin 7.4 (L) 12.0 - 15.0 g/dL    Comment: Reticulocyte Hemoglobin testing may be clinically indicated, consider ordering this additional test [id number]    HCT 22.4 (L) 36.0 - 46.0 %   MCV 76.2 (L) 80.0 - 100.0 fL   MCH 25.2 (L) 26.0 - 34.0 pg   MCHC 33.0 30.0 - 36.0 g/dL   RDW 20.7 (H) 11.5 - 15.5 %   Platelets 86 (L) 150 - 400 K/uL    Comment: Immature Platelet Fraction may be clinically indicated, consider ordering this additional test [id number] CONSISTENT WITH PREVIOUS RESULT    nRBC 0.0 0.0 - 0.2 %   Neutrophils Relative % 88 %   Neutro Abs 12.7 (H) 1.7 - 7.7 K/uL   Lymphocytes Relative 4 %   Lymphs Abs 0.6 (L) 0.7 - 4.0 K/uL   Monocytes Relative 7 %   Monocytes Absolute 0.9 0.1 - 1.0 K/uL   Eosinophils Relative 0 %   Eosinophils Absolute 0.0 0.0 - 0.5 K/uL   Basophils Relative 0 %   Basophils Absolute 0.0 0.0 - 0.1 K/uL   Immature Granulocytes 1 %   Abs Immature Granulocytes 0.09 (H) 0.00 - 0.07 K/uL    Comment: Performed at [facility], [place] [address], [place], [place] [address]  Basic metabolic panel     Status: Abnormal   Collection Time: 10/02/21  2:55 AM  Result Value Ref Range   Sodium 136 135 - 145 mmol/L   Potassium 4.0 3.5 - 5.1 mmol/L  Chloride 107 98 - 111 mmol/L   CO2 20 (L) 22 - 32 mmol/L   Glucose, Bld 144 (H) 70 - 99 mg/dL    Comment: Glucose reference range applies only to samples taken after fasting for at least 8 hours.   BUN 16 8 - 23 mg/dL   Creatinine, Ser 1.27 (H) 0.44 - 1.00 mg/dL   Calcium 7.6 (L) 8.9 - 10.3 mg/dL   GFR, Estimated 42 (L) >60 mL/min    Comment: (NOTE) Calculated using the CKD-EPI Creatinine  Equation (2021)    Anion gap 9 5 - 15    Comment: Performed at [facility], [place] [address] [place] [address], [place], [place] [address]    Imaging / Studies: No results found.  Medications / Allergies: per chart  Antibiotics: Anti-infectives (From admission, onward)    Start     Dose/Rate Route Frequency Ordered Stop   10/01/21 0600  clindamycin (CLEOCIN) IVPB 900 mg       See Hyperspace for full Linked Orders Report.   900 mg 100 mL/hr over 30 Minutes Intravenous 60 min pre-op 09/30/21 1448 10/01/21 1040   10/01/21 0600  gentamicin (GARAMYCIN) 310 mg in dextrose 5 % 100 mL IVPB       See Hyperspace for full Linked Orders Report.   5 mg/kg  61 kg 107.8 mL/hr over 60 Minutes Intravenous 60 min pre-op 09/30/21 1448 10/01/21 1106   09/30/21 1447  neomycin (MYCIFRADIN) tablet 1,000 mg  Status:  Discontinued        1,000 mg Oral As directed 09/30/21 1448 09/30/21 1544         Note: Portions of this report may have been transcribed using voice recognition software. Every effort was made to ensure accuracy; however, inadvertent computerized transcription errors may be present.   Any transcriptional errors that result from this process are unintentional.    Adin Hector, MD, FACS, MASCRS Esophageal, Gastrointestinal & Colorectal Surgery Robotic and Minimally Invasive Surgery  [facility], [place]  [place] [address] [place], [place] [address] [phone] Fax [phone] Main  CONTACT INFORMATION:  Weekday (9AM-5PM): Call [facility] main office at [phone]  Weeknight (5PM-9AM) or Weekend/Holiday: Check [email or web address] (password " [email or web address]") for General Surgery [facility] coverage  (Please, do not use SecureChat as it is not reliable communication to operating surgeons for immediate patient care)      10/02/2021  7:56 AM

## 2021-10-02 NOTE — Progress Notes (Signed)
Pharmacy Brief Note - Alvimopan (Entereg)  The standing order set for alvimopan (Entereg) now includes an automatic order to discontinue the drug after the patient has had a bowel movement. The change was approved by the [place] and the Medical Executive Committee.   This patient has had bowel movements documented by nursing. Therefore, alvimopan has been discontinued. If there are questions, please contact the pharmacy at [phone].   Thank you-  Dia Sitter, PharmD, BCPS 10/02/2021 8:09 PM

## 2021-10-02 NOTE — Evaluation (Signed)
Physical Therapy Evaluation Patient Details Name: Susan [name] MRN: [id number] DOB: [birth year] Today's Date: 10/02/2021  History of Present Illness  Susan [name], 80 y.o. female with PMH of hypertension, hypothyroidism, history of GI AV malformations with chronic anemia, MDS presented with anemia, melanotic stool.  Post EGD and colonoscopy and found to have a partially obstructing ascending colon malignancy and ROBOTIC PROXIMAL COLECTOMY 12/30  Clinical Impression   The patient  ambulated x 80' using RW. No family present  for Dc planning, Patient will require 24/7 assistance initially.patient should improve while in hospital. May require SNF level if Family not available.    Pt admitted with above diagnosis.   Pt currently with functional limitations due to the deficits listed below (see PT Problem List). Pt will benefit from skilled PT to increase their independence and safety with mobility to allow discharge to the venue listed below.        Recommendations for follow up therapy are one component of a multi-disciplinary discharge planning process, led by the attending physician.  Recommendations may be updated based on patient status, additional functional criteria and insurance authorization.  Follow Up Recommendations Home health PT    Assistance Recommended at Discharge Frequent or constant Supervision/Assistance  Functional Status Assessment Patient has had a recent decline in their functional status and demonstrates the ability to make significant improvements in function in a reasonable and predictable amount of time.  Equipment Recommendations  Rolling walker (2 wheels)    Recommendations for Other Services       Precautions / Restrictions Precautions Precautions: Fall Precaution Comments: abdominal incisions Restrictions Weight Bearing Restrictions: No      Mobility  Bed Mobility Overal bed mobility: Needs Assistance Bed Mobility: Supine to Sit     Supine to  sit: Min assist;HOB elevated          Transfers Overall transfer level: Needs assistance Equipment used: Rolling walker (2 wheels) Transfers: Sit to/from Stand Sit to Stand: Min assist           General transfer comment: Min asist to stand and min guard to ambulate with RW - needs  verbal cues for sequencing and safety    Ambulation/Gait Ambulation/Gait assistance: Min assist Gait Distance (Feet): 80 Feet Assistive device: Rolling walker (2 wheels) Gait Pattern/deviations: Step-through pattern       General Gait Details: assist to guide Rw  Stairs            Wheelchair Mobility    Modified Rankin (Stroke Patients Only)       Balance Overall balance assessment: Mild deficits observed, not formally tested                                           Pertinent Vitals/Pain Pain Assessment: Faces Faces Pain Scale: Hurts a little bit Pain Location: incision site Pain Descriptors / Indicators: Grimacing Pain Intervention(s): Monitored during session;Patient requesting pain meds-RN notified    Home Living Family/patient expects to be discharged to:: Private residence Living Arrangements: Alone Available Help at Discharge: Friend(s);Personal care attendant;Available PRN/intermittently Type of Home: Apartment (senior living) Home Access: Level entry       Home Layout: One level Home Equipment: Conservation officer, nature (2 wheels);Rollator (4 wheels);Wheelchair - manual;Grab bars - tub/shower;Cane - single point      Prior Function Prior Level of Function : Independent/Modified Independent  Mobility Comments: uses RW ADLs Comments: independent     Hand Dominance   Dominant Hand: Right    Extremity/Trunk Assessment   Upper Extremity Assessment Upper Extremity Assessment: Generalized weakness    Lower Extremity Assessment Lower Extremity Assessment: Generalized weakness    Cervical / Trunk Assessment Cervical / Trunk  Assessment: Kyphotic  Communication   Communication: No difficulties  Cognition Arousal/Alertness: Awake/alert Behavior During Therapy: WFL for tasks assessed/performed Overall Cognitive Status: No family/caregiver present to determine baseline cognitive functioning                                 General Comments: hx of dementia, memory deficits        General Comments      Exercises     Assessment/Plan    PT Assessment Patient needs continued PT services  PT Problem List Decreased strength;Decreased mobility;Decreased safety awareness;Decreased knowledge of precautions;Decreased activity tolerance;Decreased cognition;Decreased balance;Pain       PT Treatment Interventions DME instruction;Therapeutic activities;Functional mobility training;Cognitive remediation;Therapeutic exercise;Patient/family education;Gait training    PT Goals (Current goals can be found in the Care Plan section)  Acute Rehab PT Goals Patient Stated Goal: go to son's house PT Goal Formulation: With patient Time For Goal Achievement: 10/16/21 Potential to Achieve Goals: Good    Frequency Min 3X/week   Barriers to discharge Decreased caregiver support      Co-evaluation               AM-PAC PT "6 Clicks" Mobility  Outcome Measure Help needed turning from your back to your side while in a flat bed without using bedrails?: A Little Help needed moving from lying on your back to sitting on the side of a flat bed without using bedrails?: A Little Help needed moving to and from a bed to a chair (including a wheelchair)?: A Little Help needed standing up from a chair using your arms (e.g., wheelchair or bedside chair)?: A Little Help needed to walk in hospital room?: A Little Help needed climbing 3-5 steps with a railing? : A Lot 6 Click Score: 17    End of Session Equipment Utilized During Treatment: Gait belt Activity Tolerance: Patient tolerated treatment well Patient left:  in chair;with call bell/phone within reach;with chair alarm set Nurse Communication: Mobility status PT Visit Diagnosis: Unsteadiness on feet (R26.81);Pain    Time: 4982-6415 PT Time Calculation (min) (ACUTE ONLY): 23 min   Charges:   PT Evaluation $PT Eval Low Complexity: [facility] Acute Rehabilitation Services Pager [phone] Office [phone]   Claretha Cooper 10/02/2021, 12:51 PM

## 2021-10-02 NOTE — Evaluation (Signed)
Occupational Therapy Evaluation Patient Details Name: Susan [name] MRN: [id number] DOB: [birth year] Today's Date: 10/02/2021   History of Present Illness Susan [name], 80 y.o. female with PMH of hypertension, hypothyroidism, history of GI AV malformations with chronic anemia, MDS presented with anemia, melanotic stool.  Post EGD and colonoscopy and found to have a partially obstructing ascending colon malignancy and ROBOTIC PROXIMAL COLECTOMY 12/30   Clinical Impression   Susan [name] is an 80 year old woman s/p abdominal surgery who presents with mild memory deficits, generalized weakness, decreased activity tolerance and impaired balance resulting in a decline in her functional abilities. One evaluation she is min assist to stand and min guard to ambulate with RW. She needs min guard for ADLs. She requires verbal cues for sequencing and safety. Expect patient will improve while in hospital but will need at least initial 24/7 assistance until she returns to her baseline at home. Patient reports she can stay with her son - however she has not asked him this yet. If near 24/7 supervision cannot be provided patient will need short term rehab at discharge.      Recommendations for follow up therapy are one component of a multi-disciplinary discharge planning process, led by the attending physician.  Recommendations may be updated based on patient status, additional functional criteria and insurance authorization.   Follow Up Recommendations  Home health OT    Assistance Recommended at Discharge Frequent or constant Supervision/Assistance  Functional Status Assessment  Patient has had a recent decline in their functional status and demonstrates the ability to make significant improvements in function in a reasonable and predictable amount of time.  Equipment Recommendations  None recommended by OT    Recommendations for Other Services       Precautions / Restrictions  Precautions Precautions: Fall Precaution Comments: abdominal incisions Restrictions Weight Bearing Restrictions: No      Mobility Bed Mobility Overal bed mobility: Needs Assistance Bed Mobility: Supine to Sit     Supine to sit: Min assist;HOB elevated          Transfers Overall transfer level: Needs assistance Equipment used: Rolling walker (2 wheels) Transfers: Sit to/from Stand Sit to Stand: Min assist           General transfer comment: Min asist to stand and min guard to ambulate with RW - needs  verbal cues for sequencing and safety      Balance Overall balance assessment: Mild deficits observed, not formally tested                                         ADL either performed or assessed with clinical judgement   ADL Overall ADL's : Needs assistance/impaired Eating/Feeding: Independent   Grooming: Min guard;Standing   Upper Body Bathing: Min guard;Sitting   Lower Body Bathing: Min guard;Sitting/lateral leans   Upper Body Dressing : Set up;Sitting   Lower Body Dressing: Sit to/from stand;Minimal assistance   Toilet Transfer: Economist and Hygiene: Min guard;Sit to/from stand       Functional mobility during ADLs: Min guard;Rolling walker (2 wheels) General ADL Comments: verbal cues for safety technique with RW.     Vision   Vision Assessment?: No apparent visual deficits     Perception     Praxis      Pertinent Vitals/Pain Pain Assessment: Faces Faces Pain Scale:  Hurts a little bit Pain Location: incision site Pain Descriptors / Indicators: Grimacing Pain Intervention(s): Limited activity within patient's tolerance;Patient requesting pain meds-RN notified     Hand Dominance Right   Extremity/Trunk Assessment Upper Extremity Assessment Upper Extremity Assessment: Generalized weakness   Lower Extremity Assessment Lower Extremity Assessment: Defer to PT evaluation    Cervical / Trunk Assessment Cervical / Trunk Assessment: Kyphotic   Communication Communication Communication: No difficulties   Cognition Arousal/Alertness: Awake/alert Behavior During Therapy: WFL for tasks assessed/performed Overall Cognitive Status: No family/caregiver present to determine baseline cognitive functioning                                 General Comments: hx of dementia, memory deficits     General Comments       Exercises     Shoulder Instructions      Home Living Family/patient expects to be discharged to:: Private residence Living Arrangements: Alone Available Help at Discharge: Friend(s);Personal care attendant;Available PRN/intermittently Type of Home: Apartment (senior living) Home Access: Level entry     Home Layout: One level     Bathroom Shower/Tub: Teacher, early years/pre: Handicapped height     Home Equipment: Conservation officer, nature (2 wheels);Rollator (4 wheels);Wheelchair - manual;Grab bars - tub/shower;Cane - single point          Prior Functioning/Environment Prior Level of Function : Independent/Modified Independent             Mobility Comments: uses RW ADLs Comments: independent        OT Problem List: Decreased strength;Decreased activity tolerance;Impaired balance (sitting and/or standing);Decreased cognition;Decreased safety awareness;Decreased knowledge of use of DME or AE;Pain      OT Treatment/Interventions: Self-care/ADL training;Therapeutic exercise;DME and/or AE instruction;Therapeutic activities;Balance training;Patient/family education    OT Goals(Current goals can be found in the care plan section) Acute Rehab OT Goals Patient Stated Goal: get stronger OT Goal Formulation: With patient Time For Goal Achievement: 10/15/21 Potential to Achieve Goals: Good  OT Frequency: Min 2X/week   Barriers to D/C:            Co-evaluation              AM-PAC OT "6 Clicks" Daily Activity      Outcome Measure Help from another person eating meals?: None Help from another person taking care of personal grooming?: A Little Help from another person toileting, which includes using toliet, bedpan, or urinal?: A Little Help from another person bathing (including washing, rinsing, drying)?: A Little Help from another person to put on and taking off regular upper body clothing?: A Little Help from another person to put on and taking off regular lower body clothing?: A Little 6 Click Score: 19   End of Session Equipment Utilized During Treatment: Rolling walker (2 wheels) Nurse Communication: Mobility status  Activity Tolerance: Patient tolerated treatment well Patient left: in chair;with call bell/phone within reach;with chair alarm set  OT Visit Diagnosis: Unsteadiness on feet (R26.81);Muscle weakness (generalized) (M62.81)                Time: 8341-9622 OT Time Calculation (min): 23 min Charges:  OT General Charges $OT Visit: 1 Visit OT Evaluation $OT Eval Low Complexity: 1 Low  Lavergne Hiltunen, OTR/L [place]  Office [phone] Pager: [place] 10/02/2021, 11:59 AM

## 2021-10-02 NOTE — [place] Notes (Signed)
PROGRESS NOTE    Susan [name]  TLX:[id number] DOB: 1941/05/07 DOA: 09/26/2021 PCP: Susan Side., [name]   Chief Complaint  Patient presents with   Failure To Thrive   Brief Narrative/Hospital Course:  Susan [name], 80 y.o. female with PMH of hypertension, hypothyroidism, history of GI AV malformations with chronic anemia, MDS presented with anemia, melanotic stool after visiting the ER 2 days prior to [place] without being seen due to long wait time.  Per report  Son brought patient as he had not heard from her in a day or so. He noted she was confused and may have missed several days of her meds. Son noted pt had black stools. Due to this she was brought to the ED. In the ED, VSS, labs showed hgb 8.3, worsened by 1G since 2 days ago. TSH elevated at 21.26. Hemoccult positive stools.  Patient was started on Protonix drip and admitted for further management. GI was consulted-underwent EGD push enteroscopy and colonoscopy and found to have a partially obstructing ascending colon malignancy.  Surgery was consulted   Subjective: Seen this morning.  Patient is alert awake has no complaints.  Expresses thanks Overnight no fever blood pressure 100-1 20s, 120 Labs showing drop in hemoglobin, platelet count, improving creatinine  Assessment & Plan: Ascending colon cancer with bleeding: s/p robotic assisted hemicolectomy 12/30 By [email or web address] postop plan pain management PT OT as per surgery team-transfer to MedSurg from stepdown for surgery today. On Dys 1 diet.  History of GI AVMs and diverticular bleeding Acute blood loss anemia in the setting of anemia of chronic disease: She had EGD and colonoscopy that showed colon cancer likely the cause of bleeding.S/p 2 U PRBCs 12/30-repeat this morning repeat this afternoon, transfuse if less than 7 g  Recent Labs  Lab 09/29/21 0539 09/30/21 0814 10/01/21 0600 10/01/21 1932 10/02/21 0255  HGB 8.5* 7.7* 6.9* 10.1* 7.4*  HCT 26.2* 24.0*  20.6* 31.3* 22.4*    Essential hypertension: BP stable continue to hold home amlodipine, labetalol  CKD Stage 3b: Baseline creatinine ranging from 1.5 to 2s, level fluctuating -no overall improving  Recent Labs  Lab 09/28/21 0523 09/29/21 0539 09/30/21 0814 10/01/21 0600 10/02/21 0255  BUN 19 23 29* 19 16  CREATININE 1.35* 1.62* 2.15* 1.53* 1.27*    Thrombocytopenia: Unclear etiology could be multifactorial in the setting of postop status, malignancy.  May need to hold lovenox if worsens Recent Labs  Lab 09/28/21 0523 09/29/21 0539 09/30/21 0814 10/01/21 0600 10/02/21 0255  PLT 140* 143* 132* 101* 86*    Hypokalemia resolved Hypernatremia resolved Hypothyroidism: TSH was elevated 29 with free T4 normal 0.7.  On Synthroid 75 MCG daily.  Not sure if she is taking daily or not.  Will need follow-up TSH in 3 weeks to adjust the dose if remains elevated  GERD: Continue PPI  Dementia without behavioral disturbance: She appears fairly oriented and aware of what is going on pleasant.    Pyurea/bacteriuria- no fever.  Monitor.  Mld thrombocytopenia:monitor.  Leukocytosis likely postop/reactive -no fever.  Monitor  DVT prophylaxis: enoxaparin (LOVENOX) injection 30 mg Start: 10/02/21 1000 Place and maintain sequential compression device Start: 10/02/21 0651 SCDs Start: 09/26/21 2224 Code Status:   Code Status: Full Code Family Communication: plan of care discussed with patient at bedside.  Son updated on the phone previously. Status is: Inpatient Remains inpatient appropriate because: Ongoing postop management Disposition: Currently not medically stable for discharge. Anticipated Disposition: TBD-based upon GI input-CCS consulted as  biopsy came back positive.  Objective: Vitals last 24 hrs: Vitals:   10/02/21 0800 10/02/21 0856 10/02/21 0900 10/02/21 0918  BP: (!) 172/137 (!) 142/121 110/65 (!) 125/91  Pulse: 88 74 72 81  Resp: 20 (!) 23 19 14   Temp:  98.2 F (36.8 C)   98.8 F (37.1 C)  TempSrc:  Oral  Oral  SpO2: 97% 99% 100% 100%  Weight:      Height:       Weight change: -3.3 kg  Intake/Output Summary (Last 24 hours) at 10/02/2021 1008 Last data filed at 10/02/2021 0800 Gross per 24 hour  Intake 3284.61 ml  Output 1230 ml  Net 2054.61 ml   Net IO Since Admission: 7,414.83 mL [10/02/21 1008]   Physical Examination: General exam: AAOx3, pleasant, not in distress HEENT:Oral mucosa moist, Ear/Nose WNL grossly, dentition normal. Respiratory system: bilaterally clear , no use of accessory muscle Cardiovascular system: S1 & S2 +, No JVD,. Gastrointestinal system: Abdomen soft, surgical site with dressing/glue in place,ND, BS sluggish and mild tenderness + Nervous System:Alert, awake, moving extremities and grossly nonfocal Extremities: no edema, distal peripheral pulses palpable.  Skin: No rashes,no icterus. MSK: Normal muscle bulk,tone, power   Medications reviewed:  Scheduled Meds:  acetaminophen  1,000 mg Oral Q8H   alvimopan  12 mg Oral BID   Chlorhexidine Gluconate Cloth  6 each Topical Daily   donepezil  10 mg Oral Daily   enoxaparin (LOVENOX) injection  30 mg Subcutaneous Q24H   feeding supplement  237 mL Oral BID BM   gabapentin  200 mg Oral TID   levothyroxine  75 mcg Oral q morning   memantine  10 mg Oral BID   pantoprazole  40 mg Oral BID   polycarbophil  625 mg Oral BID   Continuous Infusions:  lactated ringers     methocarbamol (ROBAXIN) IV Stopped (10/02/21 0130)    Diet Order             DIET - DYS 1 Room service appropriate? Yes; Fluid consistency: Thin  Diet effective now                 Weight change: -3.3 kg  Wt Readings from Last 3 Encounters:  10/02/21 56.5 kg  09/24/21 63.5 kg  07/16/21 60.6 kg     Consultants:see note  Procedures:see note  EGD and colonoscopy as below: - The examined portion of the jejunum was normal. - Normal examined duodenum. - Gastritis. - Normal esophagus. - No  specimens collected.  Preparation of the colon was poor. - Rule out malignancy, partially obstructing tumor in the ascending colon. Biopsied. Tattooed. - Moderate diverticulosis in the sigmoid colon, in the descending colon, in the transverse colon and in the ascending colon. - Non-bleeding internal hemorrhoids.  Antimicrobials: Anti-infectives (From admission, onward)    Start     Dose/Rate Route Frequency Ordered Stop   10/01/21 0600  clindamycin (CLEOCIN) IVPB 900 mg       See Hyperspace for full Linked Orders Report.   900 mg 100 mL/hr over 30 Minutes Intravenous 60 min pre-op 09/30/21 1448 10/01/21 1040   10/01/21 0600  gentamicin (GARAMYCIN) 310 mg in dextrose 5 % 100 mL IVPB       See Hyperspace for full Linked Orders Report.   5 mg/kg  61 kg 107.8 mL/hr over 60 Minutes Intravenous 60 min pre-op 09/30/21 1448 10/01/21 1106   09/30/21 1447  neomycin (MYCIFRADIN) tablet 1,000 mg  Status:  Discontinued  1,000 mg Oral As directed 09/30/21 1448 09/30/21 1544      Culture/Microbiology    Component Value Date/Time   SDES  09/27/2021 0908    URINE, CLEAN CATCH Performed at [facility], [place] [address] [place], [place] [address]    SPECREQUEST  09/27/2021 0908    NONE Performed at [facility], [place] [address] [place], [place] [address]    CULT (A) 09/27/2021 0908    30,000 COLONIES/mL STREPTOCOCCUS AGALACTIAE TESTING AGAINST S. AGALACTIAE NOT ROUTINELY PERFORMED DUE TO PREDICTABILITY OF AMP/PEN/VAN SUSCEPTIBILITY. Performed at [facility], [place] [address] [place], [place] [address]    REPTSTATUS 09/28/2021 FINAL 09/27/2021 0908    Other culture-see note  Unresulted Labs (From admission, onward)     Start     Ordered   10/08/21 0500  Creatinine, serum  (enoxaparin (LOVENOX)  CrCl >/= 30 mL/min  )  Weekly,   R     Comments: while on enoxaparin therapy.   Question:  Specimen collection method  Answer:  Lab=Lab  collect   10/01/21 1610   10/03/21 0500  Hemoglobin  Daily,   R     Question:  Specimen collection method  Answer:  Lab=Lab collect   10/02/21 0754   10/03/21 0500  Potassium  Tomorrow morning,   R       Comments: If K < 3.5, give 53mEq KCl in 10MEq runs per protocol.  Pharmacy may adjust dosing strength, schedule, rate of infusion, etc as needed to optimize therapy   Question:  Specimen collection method  Answer:  Lab=Lab collect   10/02/21 0754   10/03/21 0500  Magnesium  Tomorrow morning,   R       Question:  Specimen collection method  Answer:  Lab=Lab collect   10/02/21 0754   10/03/21 0500  Creatinine, serum  Tomorrow morning,   R       Question:  Specimen collection method  Answer:  Lab=Lab collect   10/02/21 0754          Data Reviewed: I have personally reviewed following labs and imaging studies CBC: Recent Labs  Lab 09/28/21 0523 09/29/21 0539 09/30/21 0814 10/01/21 0600 10/01/21 1932 10/02/21 0255  WBC 5.3 5.3 9.0 6.2  --  14.3*  NEUTROABS 3.3 3.0 6.5 4.0  --  12.7*  HGB 7.8* 8.5* 7.7* 6.9* 10.1* 7.4*  HCT 24.7* 26.2* 24.0* 20.6* 31.3* 22.4*  MCV 74.6* 73.4* 72.9* 72.3*  --  76.2*  PLT 140* 143* 132* 101*  --  86*   Basic Metabolic Panel: Recent Labs  Lab 09/27/21 0525 09/28/21 0523 09/29/21 0539 09/30/21 0814 10/01/21 0600 10/02/21 0255  NA 147* 142 141 141 135 136  K 3.3* 3.7 3.7 3.5 3.3* 4.0  CL 116* 112* 110 109 106 107  CO2 25 26 21* 24 24 20*  GLUCOSE 93 91 72 114* 95 144*  BUN 31* 19 23 29* 19 16  CREATININE 1.62* 1.35* 1.62* 2.15* 1.53* 1.27*  CALCIUM 9.1 9.1 9.4 8.7* 8.2* 7.6*  MG 2.2  --   --   --   --   --   PHOS  --   --   --  2.5  --   --    GFR: Estimated Creatinine Clearance: 27 mL/min (A) (by C-G formula based on SCr of 1.27 mg/dL (H)). Liver Function Tests: Recent Labs  Lab 09/26/21 1748 09/27/21 0525  AST 16 13*  ALT 11 10  ALKPHOS 88  78  BILITOT 0.7 0.7  PROT 8.0 7.1  ALBUMIN 3.8 3.4*   No results for input(s):  LIPASE, AMYLASE in the last 168 hours. No results for input(s): AMMONIA in the last 168 hours. Coagulation Profile: No results for input(s): INR, PROTIME in the last 168 hours. Cardiac Enzymes: No results for input(s): CKTOTAL, CKMB, CKMBINDEX, TROPONINI in the last 168 hours. BNP (last 3 results) No results for input(s): PROBNP in the last 8760 hours. HbA1C: Recent Labs    09/30/21 1513  HGBA1C 4.9   CBG: No results for input(s): GLUCAP in the last 168 hours. Lipid Profile: No results for input(s): CHOL, HDL, LDLCALC, TRIG, CHOLHDL, LDLDIRECT in the last 72 hours. Thyroid Function Tests: No results for input(s): TSH, T4TOTAL, FREET4, T3FREE, THYROIDAB in the last 72 hours.  Anemia Panel: No results for input(s): VITAMINB12, FOLATE, FERRITIN, TIBC, IRON, RETICCTPCT in the last 72 hours. Sepsis Labs: No results for input(s): PROCALCITON, LATICACIDVEN in the last 168 hours.  Recent Results (from the past 240 hour(s))  Resp Panel by RT-PCR (Flu A&B, Covid) Nasopharyngeal Swab     Status: None   Collection Time: 09/26/21  7:12 PM   Specimen: Nasopharyngeal Swab; Nasopharyngeal(NP) swabs in vial transport medium  Result Value Ref Range Status   SARS Coronavirus 2 by RT PCR NEGATIVE NEGATIVE Final    Comment: (NOTE) SARS-CoV-2 target nucleic acids are NOT DETECTED.  The SARS-CoV-2 RNA is generally detectable in upper respiratory specimens during the acute phase of infection. The lowest concentration of SARS-CoV-2 viral copies this assay can detect is 138 copies/mL. A negative result does not preclude SARS-Cov-2 infection and should not be used as the sole basis for treatment or other patient management decisions. A negative result may occur with  improper specimen collection/handling, submission of specimen other than nasopharyngeal swab, presence of viral mutation(s) within the areas targeted by this assay, and inadequate number of viral copies(<138 copies/mL). A negative  result must be combined with clinical observations, patient history, and epidemiological information. The expected result is Negative.  Fact Sheet for Patients:  EntrepreneurPulse.com.au  Fact Sheet for Healthcare Providers:  IncredibleEmployment.be  This test is no t yet approved or cleared by the [place] FDA and  has been authorized for detection and/or diagnosis of SARS-CoV-2 by FDA under an Emergency Use Authorization (EUA). This EUA will remain  in effect (meaning this test can be used) for the duration of the COVID-19 declaration under Section 564(b)(1) of the Act, 21 U.S.C.section 360bbb-3(b)(1), unless the authorization is terminated  or revoked sooner.       Influenza A by PCR NEGATIVE NEGATIVE Final   Influenza B by PCR NEGATIVE NEGATIVE Final    Comment: (NOTE) The Xpert Xpress SARS-CoV-2/FLU/RSV plus assay is intended as an aid in the diagnosis of influenza from Nasopharyngeal swab specimens and should not be used as a sole basis for treatment. Nasal washings and aspirates are unacceptable for Xpert Xpress SARS-CoV-2/FLU/RSV testing.  Fact Sheet for Patients: EntrepreneurPulse.com.au  Fact Sheet for Healthcare Providers: IncredibleEmployment.be  This test is not yet approved or cleared by the [place] FDA and has been authorized for detection and/or diagnosis of SARS-CoV-2 by FDA under an Emergency Use Authorization (EUA). This EUA will remain in effect (meaning this test can be used) for the duration of the COVID-19 declaration under Section 564(b)(1) of the Act, 21 U.S.C. section 360bbb-3(b)(1), unless the authorization is terminated or revoked.  Performed at [facility], [place] [address] [place], [place]  [address]   Urine Culture     Status: Abnormal   Collection Time: 09/27/21  9:08 AM   Specimen: Urine, Clean Catch  Result Value Ref Range Status    Specimen Description   Final    URINE, CLEAN CATCH Performed at [facility], [place] [address] [place], [place] [address]    Special Requests   Final    NONE Performed at [facility], [place] [address] [place], [place] [address]    Culture (A)  Final    30,000 COLONIES/mL STREPTOCOCCUS AGALACTIAE TESTING AGAINST S. AGALACTIAE NOT ROUTINELY PERFORMED DUE TO PREDICTABILITY OF AMP/PEN/VAN SUSCEPTIBILITY. Performed at [facility], [place] [address], [place], [place] [address]    Report Status 09/28/2021 FINAL  Final  MRSA Next Gen by PCR, Nasal     Status: None   Collection Time: 10/01/21  6:19 PM   Specimen: Nasal Mucosa; Nasal Swab  Result Value Ref Range Status   MRSA by PCR Next Gen NOT DETECTED NOT DETECTED Final    Comment: (NOTE) The GeneXpert MRSA Assay (FDA approved for NASAL specimens only), is one component of a comprehensive MRSA colonization surveillance program. It is not intended to diagnose MRSA infection nor to guide or monitor treatment for MRSA infections. Test performance is not FDA approved in patients less than 90 years old. Performed at [facility], [place] [address] [place] [address] [place], [place] [address]      Radiology Studies: No results found.   LOS: 5 days   Antonieta Pert, MD [facility]  10/02/2021, 10:08 AM

## 2021-10-03 LAB — BPAM RBC
Blood Product Expiration Date: 202301132359
Blood Product Expiration Date: 202301182359
ISSUE DATE / TIME: 202212300900
ISSUE DATE / TIME: 202212301454
Unit Type and Rh: 5100
Unit Type and Rh: 5100

## 2021-10-03 LAB — CBC
HCT: 23 % — ABNORMAL LOW (ref 36.0–46.0)
Hemoglobin: 7.6 g/dL — ABNORMAL LOW (ref 12.0–15.0)
MCH: 25.2 pg — ABNORMAL LOW (ref 26.0–34.0)
MCHC: 33 g/dL (ref 30.0–36.0)
MCV: 76.2 fL — ABNORMAL LOW (ref 80.0–100.0)
Platelets: 88 10*3/uL — ABNORMAL LOW (ref 150–400)
RBC: 3.02 MIL/uL — ABNORMAL LOW (ref 3.87–5.11)
RDW: 21.7 % — ABNORMAL HIGH (ref 11.5–15.5)
WBC: 10.3 10*3/uL (ref 4.0–10.5)
nRBC: 0 % (ref 0.0–0.2)

## 2021-10-03 LAB — BASIC METABOLIC PANEL
Anion gap: 8 (ref 5–15)
BUN: 24 mg/dL — ABNORMAL HIGH (ref 8–23)
CO2: 22 mmol/L (ref 22–32)
Calcium: 7.8 mg/dL — ABNORMAL LOW (ref 8.9–10.3)
Chloride: 109 mmol/L (ref 98–111)
Creatinine, Ser: 1.25 mg/dL — ABNORMAL HIGH (ref 0.44–1.00)
GFR, Estimated: 43 mL/min — ABNORMAL LOW (ref >60)
Glucose, Bld: 79 mg/dL (ref 70–99)
Potassium: 3.7 mmol/L (ref 3.5–5.1)
Sodium: 139 mmol/L (ref 135–145)

## 2021-10-03 LAB — MAGNESIUM: Magnesium: 1.4 mg/dL — ABNORMAL LOW (ref 1.7–2.4)

## 2021-10-03 LAB — TYPE AND SCREEN
ABO/RH(D): O POS
Antibody Screen: POSITIVE
Donor AG Type: NEGATIVE
Donor AG Type: NEGATIVE
Unit division: 0
Unit division: 0

## 2021-10-03 NOTE — Plan of Care (Signed)
  Problem: Coping: Goal: Level of anxiety will decrease Outcome: Progressing   Problem: Pain Managment: Goal: General experience of comfort will improve Outcome: Progressing   

## 2021-10-03 NOTE — Progress Notes (Signed)
PROGRESS NOTE    Susan [name]  WCB:[id number] DOB: 23-Feb-1941 DOA: 09/26/2021 PCP: Sonia Side., [name]   Chief Complaint  Patient presents with   Failure To Thrive   Brief Narrative/Hospital Course:  Susan [name], 81 y.o. female with PMH of hypertension, hypothyroidism, history of GI AV malformations with chronic anemia, MDS presented with anemia, melanotic stool after visiting the ER 2 days prior to [place] without being seen due to long wait time.  Per report  Son brought patient as he had not heard from her in a day or so. He noted she was confused and may have missed several days of her meds. Son noted pt had black stools. Due to this she was brought to the ED. In the ED, VSS, labs showed hgb 8.3, worsened by 1G since 2 days ago. TSH elevated at 21.26. Hemoccult positive stools.  Patient was started on Protonix drip and admitted for further management. GI was consulted-underwent EGD push enteroscopy and colonoscopy and found to have a partially obstructing ascending colon malignancy.  Surgery was consulted-on robotic assisted hemicolectomy 12/30 and transferred to stepdown unit.  Postop did well transferred back to MedSurg 12/31   Subjective: Seen and examined.   Overnight no fever Had large mushy dark/brown bowel movement Creatinine stable 1.2 hemoglobin at 7.6 g leukocytosis improved.  Platelet low at 88 About to go to the bathroom, does not like this out for months.  Assessment & Plan: Ascending colon cancer with bleeding: s/p robotic assisted hemicolectomy 12/30 By DrGross.had a bowel movement yesterday.  Continue pain control PT OT as per surgery.  On dysphagia 1 diet.  Advance as tolerated per CCS. surgical site has mild -surgery to round this morning.   History of GI AVMs and diverticular bleeding Acute blood loss anemia in the setting of anemia of chronic disease: She had EGD and colonoscopy that showed colon cancer likely the cause of bleeding.S/p 2 U PRBCs 12/30,  monitor hemoglobin,transfuse if less than 7 g  Recent Labs  Lab 09/30/21 0814 10/01/21 0600 10/01/21 1932 10/02/21 0255 10/03/21 0457  HGB 7.7* 6.9* 10.1* 7.4* 7.6*  HCT 24.0* 20.6* 31.3* 22.4* 23.0*    Essential hypertension: BP stable, continue propanolol, continue to hold home amlodipine, labetalol  CKD Stage 3b: Baseline creatinine ranging from 1.5 to 2s, level fluctuating -no overall improving  Recent Labs  Lab 09/29/21 0539 09/30/21 0814 10/01/21 0600 10/02/21 0255 10/03/21 0457  BUN 23 29* 19 16 24*  CREATININE 1.62* 2.15* 1.53* 1.27* 1.25*    Thrombocytopenia: Unclear etiology could be multifactorial in the setting of postop status, malignancy.  May need to hold lovenox if worsens Recent Labs  Lab 09/29/21 0539 09/30/21 0814 10/01/21 0600 10/02/21 0255 10/03/21 0457  PLT 143* 132* 101* 86* 88*    Hypokalemia/hyponatremia: resolved Hypothyroidism: TSH was elevated 29 with free T4 normal 0.7.  Continue her Synthroid at 66 she was taking but will benefit with repeating 3 weeks   GERD: On PPI  Dementia without behavioral disturbance: Pleasant fairly oriented communicative.  Continue supportive measures   Pyurea/bacteriuria- no fever.  Monitor.  Thrombocytopenia: In the setting of malignancy, acute illness.  Monitor.  May need to hold off on Lovenox if continues to be decreased. Recent Labs  Lab 09/29/21 0539 09/30/21 0814 10/01/21 0600 10/02/21 0255 10/03/21 0457  PLT 143* 132* 101* 86* 88*    Leukocytosis likely postop/reactive -no fever.Monitor  DVT prophylaxis: enoxaparin (LOVENOX) injection 30 mg Start: 10/02/21 1000 Place and maintain  sequential compression device Start: 10/02/21 0651 SCDs Start: 09/26/21 2224 Code Status:   Code Status: Full Code Family Communication: plan of care discussed with patient at bedside.  Son updated on the phone previously. Status is: Inpatient Remains inpatient appropriate because: Ongoing postop  management Disposition: Currently not medically stable for discharge. Anticipated Disposition: Home with home health PT OT   Objective: Vitals last 24 hrs: Vitals:   10/02/21 0918 10/02/21 1406 10/02/21 2126 10/03/21 0541  BP: (!) 125/91 (!) 112/58 108/66 135/77  Pulse: 81 61 91 71  Resp: 14 14 15 16   Temp: 98.8 F (37.1 C) 97.7 F (36.5 C) 98.2 F (36.8 C) 98.2 F (36.8 C)  TempSrc: Oral Oral Oral Oral  SpO2: 100% 100% 99% 100%  Weight:      Height:       Weight change:   Intake/Output Summary (Last 24 hours) at 10/03/2021 0959 Last data filed at 10/03/2021 0900 Gross per 24 hour  Intake 830 ml  Output --  Net 830 ml   Net IO Since Admission: 8,244.83 mL [10/03/21 0959]   Physical Examination: General exam: AAO, pleasant, elderly HEENT:Oral mucosa moist, Ear/Nose WNL grossly, dentition normal. Respiratory system: bilaterally CLEAR, no use of accessory muscle Cardiovascular system: S1 & S2 +, No JVD,. Gastrointestinal system: Abdomen soft, tender abdomen, bowel sounds sluggish, slightly soaked dressing Nervous System:Alert, awake, moving extremities and grossly nonfocal Extremities: no edema, distal peripheral pulses palpable.  Skin: No rashes,no icterus. MSK: Normal muscle bulk,tone, power   Medications reviewed:  Scheduled Meds:  acetaminophen  1,000 mg Oral Q8H   Chlorhexidine Gluconate Cloth  6 each Topical Daily   donepezil  10 mg Oral Daily   enoxaparin (LOVENOX) injection  30 mg Subcutaneous Q24H   feeding supplement  237 mL Oral BID BM   gabapentin  200 mg Oral TID   levothyroxine  75 mcg Oral q morning   memantine  10 mg Oral BID   pantoprazole  40 mg Oral BID   polycarbophil  625 mg Oral BID   Continuous Infusions:  methocarbamol (ROBAXIN) IV Stopped (10/02/21 0130)    Diet Order             DIET - DYS 1 Room service appropriate? Yes; Fluid consistency: Thin  Diet effective now                 Weight change:   Wt Readings from Last 3  Encounters:  10/02/21 56.5 kg  09/24/21 63.5 kg  07/16/21 60.6 kg     Consultants:see note  Procedures:see note  EGD and colonoscopy as below: - The examined portion of the jejunum was normal. - Normal examined duodenum. - Gastritis. - Normal esophagus. - No specimens collected.  Preparation of the colon was poor. - Rule out malignancy, partially obstructing tumor in the ascending colon. Biopsied. Tattooed. - Moderate diverticulosis in the sigmoid colon, in the descending colon, in the transverse colon and in the ascending colon. - Non-bleeding internal hemorrhoids.  Antimicrobials: Anti-infectives (From admission, onward)    Start     Dose/Rate Route Frequency Ordered Stop   10/01/21 0600  clindamycin (CLEOCIN) IVPB 900 mg       See Hyperspace for full Linked Orders Report.   900 mg 100 mL/hr over 30 Minutes Intravenous 60 min pre-op 09/30/21 1448 10/01/21 1040   10/01/21 0600  gentamicin (GARAMYCIN) 310 mg in dextrose 5 % 100 mL IVPB       See Hyperspace for full Linked Orders  Report.   5 mg/kg  61 kg 107.8 mL/hr over 60 Minutes Intravenous 60 min pre-op 09/30/21 1448 10/01/21 1106   09/30/21 1447  neomycin (MYCIFRADIN) tablet 1,000 mg  Status:  Discontinued        1,000 mg Oral As directed 09/30/21 1448 09/30/21 1544      Culture/Microbiology    Component Value Date/Time   SDES  09/27/2021 0908    URINE, CLEAN CATCH Performed at [facility], [place] [address], [place], [place] [address]    SPECREQUEST  09/27/2021 0908    NONE Performed at [facility], [place] [address], [place], [place] [address]    CULT (A) 09/27/2021 0908    30,000 COLONIES/mL STREPTOCOCCUS AGALACTIAE TESTING AGAINST S. AGALACTIAE NOT ROUTINELY PERFORMED DUE TO PREDICTABILITY OF AMP/PEN/VAN SUSCEPTIBILITY. Performed at [facility], [place] [address] [place],  [address]    REPTSTATUS 09/28/2021 FINAL 09/27/2021 0908    Other culture-see  note  Unresulted Labs (From admission, onward)     Start     Ordered   10/08/21 0500  Creatinine, serum  (enoxaparin (LOVENOX)  CrCl >/= 30 mL/min  )  Weekly,   R     Comments: while on enoxaparin therapy.   Question:  Specimen collection method  Answer:  Lab=Lab collect   10/01/21 1610   10/03/21 0500  Hemoglobin  Daily,   R     Question:  Specimen collection method  Answer:  Lab=Lab collect   10/02/21 0754   10/03/21 0500  CBC  Daily,   R     Question:  Specimen collection method  Answer:  Lab=Lab collect   10/02/21 1012   10/03/21 5366  Basic metabolic panel  Daily,   R     Question:  Specimen collection method  Answer:  Lab=Lab collect   10/02/21 1012          Data Reviewed: I have personally reviewed following labs and imaging studies CBC: Recent Labs  Lab 09/28/21 0523 09/29/21 0539 09/30/21 0814 10/01/21 0600 10/01/21 1932 10/02/21 0255 10/03/21 0457  WBC 5.3 5.3 9.0 6.2  --  14.3* 10.3  NEUTROABS 3.3 3.0 6.5 4.0  --  12.7*  --   HGB 7.8* 8.5* 7.7* 6.9* 10.1* 7.4* 7.6*  HCT 24.7* 26.2* 24.0* 20.6* 31.3* 22.4* 23.0*  MCV 74.6* 73.4* 72.9* 72.3*  --  76.2* 76.2*  PLT 140* 143* 132* 101*  --  86* 88*   Basic Metabolic Panel: Recent Labs  Lab 09/27/21 0525 09/28/21 0523 09/29/21 0539 09/30/21 0814 10/01/21 0600 10/02/21 0255 10/03/21 0457  NA 147*   < > 141 141 135 136 139  K 3.3*   < > 3.7 3.5 3.3* 4.0 3.7  CL 116*   < > 110 109 106 107 109  CO2 25   < > 21* 24 24 20* 22  GLUCOSE 93   < > 72 114* 95 144* 79  BUN 31*   < > 23 29* 19 16 24*  CREATININE 1.62*   < > 1.62* 2.15* 1.53* 1.27* 1.25*  CALCIUM 9.1   < > 9.4 8.7* 8.2* 7.6* 7.8*  MG 2.2  --   --   --   --   --  1.4*  PHOS  --   --   --  2.5  --   --   --    < > = values in this interval not displayed.   GFR: Estimated Creatinine Clearance: 27.4 mL/min (  A) (by C-G formula based on SCr of 1.25 mg/dL (H)). Liver Function Tests: Recent Labs  Lab 09/26/21 1748 09/27/21 0525  AST 16 13*  ALT  11 10  ALKPHOS 88 78  BILITOT 0.7 0.7  PROT 8.0 7.1  ALBUMIN 3.8 3.4*   No results for input(s): LIPASE, AMYLASE in the last 168 hours. No results for input(s): AMMONIA in the last 168 hours. Coagulation Profile: No results for input(s): INR, PROTIME in the last 168 hours. Cardiac Enzymes: No results for input(s): CKTOTAL, CKMB, CKMBINDEX, TROPONINI in the last 168 hours. BNP (last 3 results) No results for input(s): PROBNP in the last 8760 hours. HbA1C: Recent Labs    09/30/21 1513  HGBA1C 4.9   CBG: No results for input(s): GLUCAP in the last 168 hours. Lipid Profile: No results for input(s): CHOL, HDL, LDLCALC, TRIG, CHOLHDL, LDLDIRECT in the last 72 hours. Thyroid Function Tests: No results for input(s): TSH, T4TOTAL, FREET4, T3FREE, THYROIDAB in the last 72 hours.  Anemia Panel: No results for input(s): VITAMINB12, FOLATE, FERRITIN, TIBC, IRON, RETICCTPCT in the last 72 hours. Sepsis Labs: No results for input(s): PROCALCITON, LATICACIDVEN in the last 168 hours.  Recent Results (from the past 240 hour(s))  Resp Panel by RT-PCR (Flu A&B, Covid) Nasopharyngeal Swab     Status: None   Collection Time: 09/26/21  7:12 PM   Specimen: Nasopharyngeal Swab; Nasopharyngeal(NP) swabs in vial transport medium  Result Value Ref Range Status   SARS Coronavirus 2 by RT PCR NEGATIVE NEGATIVE Final    Comment: (NOTE) SARS-CoV-2 target nucleic acids are NOT DETECTED.  The SARS-CoV-2 RNA is generally detectable in upper respiratory specimens during the acute phase of infection. The lowest concentration of SARS-CoV-2 viral copies this assay can detect is 138 copies/mL. A negative result does not preclude SARS-Cov-2 infection and should not be used as the sole basis for treatment or other patient management decisions. A negative result may occur with  improper specimen collection/handling, submission of specimen other than nasopharyngeal swab, presence of viral mutation(s) within  the areas targeted by this assay, and inadequate number of viral copies(<138 copies/mL). A negative result must be combined with clinical observations, patient history, and epidemiological information. The expected result is Negative.  Fact Sheet for Patients:  EntrepreneurPulse.com.au  Fact Sheet for Healthcare Providers:  IncredibleEmployment.be  This test is no t yet approved or cleared by the [place] FDA and  has been authorized for detection and/or diagnosis of SARS-CoV-2 by FDA under an Emergency Use Authorization (EUA). This EUA will remain  in effect (meaning this test can be used) for the duration of the COVID-19 declaration under Section 564(b)(1) of the Act, 21 U.S.C.section 360bbb-3(b)(1), unless the authorization is terminated  or revoked sooner.       Influenza A by PCR NEGATIVE NEGATIVE Final   Influenza B by PCR NEGATIVE NEGATIVE Final    Comment: (NOTE) The Xpert Xpress SARS-CoV-2/FLU/RSV plus assay is intended as an aid in the diagnosis of influenza from Nasopharyngeal swab specimens and should not be used as a sole basis for treatment. Nasal washings and aspirates are unacceptable for Xpert Xpress SARS-CoV-2/FLU/RSV testing.  Fact Sheet for Patients: EntrepreneurPulse.com.au  Fact Sheet for Healthcare Providers: IncredibleEmployment.be  This test is not yet approved or cleared by the [place] FDA and has been authorized for detection and/or diagnosis of SARS-CoV-2 by FDA under an Emergency Use Authorization (EUA). This EUA will remain in effect (meaning this test can be used) for the duration of  the COVID-19 declaration under Section 564(b)(1) of the Act, 21 U.S.C. section 360bbb-3(b)(1), unless the authorization is terminated or revoked.  Performed at [facility], [place] [address], [place], [place] [address]   Urine Culture     Status: Abnormal    Collection Time: 09/27/21  9:08 AM   Specimen: Urine, Clean Catch  Result Value Ref Range Status   Specimen Description   Final    URINE, CLEAN CATCH Performed at [facility], [place] [address], [place], [place] [address]    Special Requests   Final    NONE Performed at [facility], [place] [address], [place], [place] [address]    Culture (A)  Final    30,000 COLONIES/mL STREPTOCOCCUS AGALACTIAE TESTING AGAINST S. AGALACTIAE NOT ROUTINELY PERFORMED DUE TO PREDICTABILITY OF AMP/PEN/VAN SUSCEPTIBILITY. Performed at [facility], [place] [address], [place], [place] [address]    Report Status 09/28/2021 FINAL  Final  MRSA Next Gen by PCR, Nasal     Status: None   Collection Time: 10/01/21  6:19 PM   Specimen: Nasal Mucosa; Nasal Swab  Result Value Ref Range Status   MRSA by PCR Next Gen NOT DETECTED NOT DETECTED Final    Comment: (NOTE) The GeneXpert MRSA Assay (FDA approved for NASAL specimens only), is one component of a comprehensive MRSA colonization surveillance program. It is not intended to diagnose MRSA infection nor to guide or monitor treatment for MRSA infections. Test performance is not FDA approved in patients less than 73 years old. Performed at [facility], [place] [address], [place], [place] [address]      Radiology Studies: No results found.   LOS: 6 days   Antonieta Pert, MD [facility]  10/03/2021, 9:59 AM

## 2021-10-03 NOTE — Progress Notes (Signed)
°   10/03/21 1200  Mobility  Activity Transferred:  Bed to chair;Transferred:  Chair to bed;Stood at bedside  Level of Assistance Minimal assist, patient does 75% or more  Assistive Device Front wheel walker  Minutes Stood 5 minutes  Distance Ambulated (ft) 10 ft  Mobility Ambulated with assistance in room  Mobility Response Tolerated fair  Mobility performed by Mobility specialist  $Mobility charge 1 Mobility   Pt agreeable to mobilize this morning. Got pt EOB, and pt stated she was feeling weak today. Pt stood for about 3 minutes and walked to chair. Had pt sit and conducted LE exercises. Pt completed 10x dorsiflexion/leg and 10x ankle circles/side. Pt tood for about 2 minutes and returned to the bed. Left in bed with call bell at side. RN notified of session.  [place] Specialist Acute Rehab Services Office: [phone]

## 2021-10-03 NOTE — Progress Notes (Signed)
Patient was incontinent of large BM this evening, mushy brown/black, denies pain, poor appetite, took evening medications without difficulty, vss, lower abdominal honeycomb dressing and lap sites CDI, patient sleeping well with no acute distress noted, will continue to monitor.

## 2021-10-03 NOTE — Progress Notes (Signed)
2 Days Post-Op   Subjective/Chief Complaint: Complains of blood in bm. Nurses witnessed her bm and said did not look bloody. Otherwise doing ok   Objective: Vital signs in last 24 hours: Temp:  [97.7 F (36.5 C)-98.2 F (36.8 C)] 98.2 F (36.8 C) (01/01 0541) Pulse Rate:  [61-91] 71 (01/01 0541) Resp:  [14-16] 16 (01/01 0541) BP: (108-135)/(58-77) 135/77 (01/01 0541) SpO2:  [99 %-100 %] 100 % (01/01 0541) Last BM Date: 10/03/21  Intake/Output from previous day: 12/31 0701 - 01/01 0700 In: 710 [P.O.:660; IV Piggyback:50] Out: 45 [Urine:45] Intake/Output this shift: Total I/O In: 120 [P.O.:120] Out: -   General appearance: alert and cooperative Resp: clear to auscultation bilaterally Cardio: regular rate and rhythm GI: soft, mild tenderness. Good bs  Lab Results:  Recent Labs    10/02/21 0255 10/03/21 0457  WBC 14.3* 10.3  HGB 7.4* 7.6*  HCT 22.4* 23.0*  PLT 86* 88*   BMET Recent Labs    10/02/21 0255 10/03/21 0457  NA 136 139  K 4.0 3.7  CL 107 109  CO2 20* 22  GLUCOSE 144* 79  BUN 16 24*  CREATININE 1.27* 1.25*  CALCIUM 7.6* 7.8*   PT/INR No results for input(s): LABPROT, INR in the last 72 hours. ABG No results for input(s): PHART, HCO3 in the last 72 hours.  Invalid input(s): PCO2, PO2  Studies/Results: No results found.  Anti-infectives: Anti-infectives (From admission, onward)    Start     Dose/Rate Route Frequency Ordered Stop   10/01/21 0600  clindamycin (CLEOCIN) IVPB 900 mg       See Hyperspace for full Linked Orders Report.   900 mg 100 mL/hr over 30 Minutes Intravenous 60 min pre-op 09/30/21 1448 10/01/21 1040   10/01/21 0600  gentamicin (GARAMYCIN) 310 mg in dextrose 5 % 100 mL IVPB       See Hyperspace for full Linked Orders Report.   5 mg/kg  61 kg 107.8 mL/hr over 60 Minutes Intravenous 60 min pre-op 09/30/21 1448 10/01/21 1106   09/30/21 1447  neomycin (MYCIFRADIN) tablet 1,000 mg  Status:  Discontinued        1,000 mg  Oral As directed 09/30/21 1448 09/30/21 1544       Assessment/Plan: s/p Procedure(s): XI ROBOT ASSISTED RIGHT HEMICOLECTOMY, WITH TAP BLOCK, ASSESSMENT OF TISSUE PERFUSION USING FIREFLY (Right) Advance diet as tolerated Hg stable. No evidence of bleeding POD 2 VTE prophylaxis. On lovenox ambulate  LOS: 6 days    Autumn Messing III 10/03/2021

## 2021-10-04 LAB — BASIC METABOLIC PANEL
Anion gap: 5 (ref 5–15)
BUN: 24 mg/dL — ABNORMAL HIGH (ref 8–23)
CO2: 23 mmol/L (ref 22–32)
Calcium: 8 mg/dL — ABNORMAL LOW (ref 8.9–10.3)
Chloride: 110 mmol/L (ref 98–111)
Creatinine, Ser: 1.32 mg/dL — ABNORMAL HIGH (ref 0.44–1.00)
GFR, Estimated: 40 mL/min — ABNORMAL LOW (ref >60)
Glucose, Bld: 74 mg/dL (ref 70–99)
Potassium: 3.8 mmol/L (ref 3.5–5.1)
Sodium: 138 mmol/L (ref 135–145)

## 2021-10-04 LAB — CBC
HCT: 23.5 % — ABNORMAL LOW (ref 36.0–46.0)
Hemoglobin: 7.7 g/dL — ABNORMAL LOW (ref 12.0–15.0)
MCH: 25.2 pg — ABNORMAL LOW (ref 26.0–34.0)
MCHC: 32.8 g/dL (ref 30.0–36.0)
MCV: 77 fL — ABNORMAL LOW (ref 80.0–100.0)
Platelets: 102 10*3/uL — ABNORMAL LOW (ref 150–400)
RBC: 3.05 MIL/uL — ABNORMAL LOW (ref 3.87–5.11)
RDW: 22.5 % — ABNORMAL HIGH (ref 11.5–15.5)
WBC: 7.4 10*3/uL (ref 4.0–10.5)
nRBC: 0 % (ref 0.0–0.2)

## 2021-10-04 NOTE — Plan of Care (Signed)
  Problem: Coping: Goal: Level of anxiety will decrease Outcome: Progressing   Problem: Pain Managment: Goal: General experience of comfort will improve Outcome: Progressing   Problem: Safety: Goal: Ability to remain free from injury will improve Outcome: Progressing   

## 2021-10-04 NOTE — Progress Notes (Signed)
3 Days Post-Op   Subjective/Chief Complaint: Afebrile, vitals stable. Passing flatus, had one BM yesterday. Tolerating PO intake, denies nausea/vomiting.   Objective: Vital signs in last 24 hours: Temp:  [99 F (37.2 C)-99.6 F (37.6 C)] 99.6 F (37.6 C) (01/02 0531) Pulse Rate:  [77-86] 77 (01/02 0531) Resp:  [16-18] 16 (01/02 0531) BP: (112-118)/(67-73) 114/67 (01/02 0531) SpO2:  [99 %-100 %] 100 % (01/02 0531) Last BM Date: 10/03/21  Intake/Output from previous day: 01/01 0701 - 01/02 0700 In: 840 [P.O.:840] Out: 300 [Urine:300] Intake/Output this shift: Total I/O In: 480 [P.O.:480] Out: -   General: resting comfortably, NAD Neuro: alert and oriented, no focal deficits Resp: normal work of breathing Abdomen: soft, nondistended, nontender to palpation. Incisions clean and dry, honeycomb dressing in place over Pfannenstiel. Extremities: warm and well-perfused   Lab Results:  Recent Labs    10/03/21 0457 10/04/21 0426  WBC 10.3 7.4  HGB 7.6* 7.7*  HCT 23.0* 23.5*  PLT 88* 102*   BMET Recent Labs    10/03/21 0457 10/04/21 0426  NA 139 138  K 3.7 3.8  CL 109 110  CO2 22 23  GLUCOSE 79 74  BUN 24* 24*  CREATININE 1.25* 1.32*  CALCIUM 7.8* 8.0*   PT/INR No results for input(s): LABPROT, INR in the last 72 hours. ABG No results for input(s): PHART, HCO3 in the last 72 hours.  Invalid input(s): PCO2, PO2  Studies/Results: No results found.  Anti-infectives: Anti-infectives (From admission, onward)    Start     Dose/Rate Route Frequency Ordered Stop   10/01/21 0600  clindamycin (CLEOCIN) IVPB 900 mg       See Hyperspace for full Linked Orders Report.   900 mg 100 mL/hr over 30 Minutes Intravenous 60 min pre-op 09/30/21 1448 10/01/21 1040   10/01/21 0600  gentamicin (GARAMYCIN) 310 mg in dextrose 5 % 100 mL IVPB       See Hyperspace for full Linked Orders Report.   5 mg/kg  61 kg 107.8 mL/hr over 60 Minutes Intravenous 60 min pre-op 09/30/21  1448 10/01/21 1106   09/30/21 1447  neomycin (MYCIFRADIN) tablet 1,000 mg  Status:  Discontinued        1,000 mg Oral As directed 09/30/21 1448 09/30/21 1544       Assessment/Plan: s/p Procedure(s): XI ROBOT ASSISTED RIGHT HEMICOLECTOMY, WITH TAP BLOCK, ASSESSMENT OF TISSUE PERFUSION USING FIREFLY (Right) POD3 Continue soft diet VTE prophylaxis. On lovenox Ambulate PT following, recommending HHPT Anticipate discharge home in 1-2 days   LOS: 7 days    Susan Birks, MD [facility] General, Hepatobiliary and Pancreatic Surgery 10/04/21 12:29 PM

## 2021-10-04 NOTE — Progress Notes (Signed)
Physical Therapy Treatment Patient Details Name: Susan [name] [name] MRN: [id number] DOB: 01/03/1941 Today's Date: 10/04/2021   History of Present Illness Susan [name], 81 y.o. female with PMH of hypertension, hypothyroidism, history of GI AV malformations with chronic anemia, MDS presented with anemia, melanotic stool.  Post EGD and colonoscopy and found to have a partially obstructing ascending colon malignancy and ROBOTIC PROXIMAL COLECTOMY 12/30    PT Comments    Pt progressing well. Seen again per [facility] request for stair training. Pt able amb to BR, short hallway distance and ascend/descend 5 steps x2 with min assist. Pt would likely be able to perform the flight of stairs to her (sister's-?) home.  She would need a little longer recovery to be able to ascend 3 flights to her son's home. Certainly a goal for HHPT if she does have the option to d/c to her sister's. Continue to follow.   Recommendations for follow up therapy are one component of a multi-disciplinary discharge planning process, led by the attending physician.  Recommendations may be updated based on patient status, additional functional criteria and insurance authorization.  Follow Up Recommendations  Home health PT     Assistance Recommended at Discharge Frequent or constant Supervision/Assistance  Equipment Recommendations  Rolling walker (2 wheels)    Recommendations for Other Services       Precautions / Restrictions Precautions Precautions: Fall Precaution Comments: abdominal incisions     Mobility  Bed Mobility Overal bed mobility: Needs Assistance Bed Mobility: Rolling;Sidelying to Sit;Sit to Sidelying Rolling: Min guard Sidelying to sit: Min assist       General bed mobility comments: light assist to elevate trunk and cues for log roll technique    Transfers Overall transfer level: Needs assistance Equipment used: Rolling walker (2 wheels) Transfers: Sit to/from Stand Sit to Stand: Min guard            General transfer comment: cues for hand placement, min/guard for safety; from toilet, bed, recliner    Ambulation/Gait Ambulation/Gait assistance: Min guard;Min assist Gait Distance (Feet): 30 Feet Assistive device: Rolling walker (2 wheels) Gait Pattern/deviations: Step-through pattern;Decreased stride length       General Gait Details: assist to maneuver RW especially with turns. incr time   Stairs Stairs: Yes Stairs assistance: Min guard;Min assist Stair Management: Two rails;One rail Right;Step to pattern;Forwards Number of Stairs: 5 (x2) General stair comments: cues for step to pattern, light assist to steady and control ascent/descent. able to amb back to room ~10' after stair practice   Wheelchair Mobility    Modified Rankin (Stroke Patients Only)       Balance                                            Cognition Arousal/Alertness: Awake/alert Behavior During Therapy: WFL for tasks assessed/performed Overall Cognitive Status: History of cognitive impairments - at baseline                                 General Comments: hx of dementia, memory deficits; appropriate duirng PT tx, follows commands consistently, converses appropriately        Exercises      General Comments        Pertinent Vitals/Pain Pain Assessment: Faces Faces Pain Scale: Hurts a little bit Pain Location: incision site/abdomen  with movement Pain Descriptors / Indicators: Grimacing Pain Intervention(s): Limited activity within patient's tolerance;Monitored during session;Repositioned    Home Living                          Prior Function            PT Goals (current goals can now be found in the care plan section) Acute Rehab PT Goals Patient Stated Goal: go to son's house PT Goal Formulation: With patient Time For Goal Achievement: 10/16/21 Potential to Achieve Goals: Good Progress towards PT goals: Progressing toward  goals    Frequency    Min 3X/week      PT Plan Current plan remains appropriate    Co-evaluation              AM-PAC PT "6 Clicks" Mobility   Outcome Measure  Help needed turning from your back to your side while in a flat bed without using bedrails?: A Little Help needed moving from lying on your back to sitting on the side of a flat bed without using bedrails?: A Little Help needed moving to and from a bed to a chair (including a wheelchair)?: A Little Help needed standing up from a chair using your arms (e.g., wheelchair or bedside chair)?: A Little Help needed to walk in hospital room?: A Little Help needed climbing 3-5 steps with a railing? : A Little 6 Click Score: 18    End of Session Equipment Utilized During Treatment: Gait belt Activity Tolerance: Patient tolerated treatment well Patient left: in bed;with call bell/phone within reach;with bed alarm set   PT Visit Diagnosis: Unsteadiness on feet (R26.81);Pain     Time: 1431-1451 PT Time Calculation (min) (ACUTE ONLY): 20 min  Charges:  $Gait Training: 8-22 mins                     Baxter Flattery, PT  Acute Rehab Dept ([place]) [phone] Pager [phone]  10/04/2021    [facility] 10/04/2021, 2:59 PM

## 2021-10-04 NOTE — Progress Notes (Signed)
Physical Therapy Treatment Patient Details Name: Susan [name] MRN: [id number] DOB: [birth year] Today's Date: 10/04/2021   History of Present Illness Atisha J Kincy, 81 y.o. female with PMH of hypertension, hypothyroidism, history of GI AV malformations with chronic anemia, MDS presented with anemia, melanotic stool.  Post EGD and colonoscopy and found to have a partially obstructing ascending colon malignancy and ROBOTIC PROXIMAL COLECTOMY 12/30    PT Comments    Pt progressing toward goals. Still having some discomfort with mobility but motivated to mobilize. Continue PT in acute setting   Recommendations for follow up therapy are one component of a multi-disciplinary discharge planning process, led by the attending physician.  Recommendations may be updated based on patient status, additional functional criteria and insurance authorization.  Follow Up Recommendations  Home health PT     Assistance Recommended at Discharge Frequent or constant Supervision/Assistance  Equipment Recommendations  Rolling walker (2 wheels)    Recommendations for Other Services       Precautions / Restrictions Precautions Precautions: Fall Precaution Comments: abdominal incisions Restrictions Weight Bearing Restrictions: No     Mobility  Bed Mobility Overal bed mobility: Needs Assistance Bed Mobility: Rolling;Sidelying to Sit;Sit to Sidelying Rolling: Min guard Sidelying to sit: Min assist     Sit to sidelying: Min assist General bed mobility comments: light assist to elevate trunk and to lift LEs on to bed. cues for log roll technique    Transfers Overall transfer level: Needs assistance Equipment used: Rolling walker (2 wheels) Transfers: Sit to/from Stand Sit to Stand: Min guard           General transfer comment: cues for hand placement, min/guard for safety    Ambulation/Gait Ambulation/Gait assistance: Min guard;Min assist Gait Distance (Feet): 85 Feet Assistive device:  Rolling walker (2 wheels) Gait Pattern/deviations: Step-through pattern;Decreased stride length       General Gait Details: assist to maneuver RW especially with turns. incr time   Marine scientist Rankin (Stroke Patients Only)       Balance                                            Cognition Arousal/Alertness: Awake/alert Behavior During Therapy: WFL for tasks assessed/performed Overall Cognitive Status: History of cognitive impairments - at baseline                                 General Comments: hx of dementia, memory deficits; appropriate duirng PT tx, follows commands consistently        Exercises      General Comments        Pertinent Vitals/Pain Pain Assessment: Faces Faces Pain Scale: Hurts a little bit Pain Location: incision site/abdomen with movement Pain Descriptors / Indicators: Grimacing Pain Intervention(s): Limited activity within patient's tolerance;Monitored during session;Premedicated before session;Repositioned    Home Living                          Prior Function            PT Goals (current goals can now be found in the care plan section) Acute Rehab PT Goals Patient Stated Goal: go to son's house PT Goal Formulation:  With patient Time For Goal Achievement: 10/16/21 Potential to Achieve Goals: Good Progress towards PT goals: Progressing toward goals    Frequency    Min 3X/week      PT Plan Current plan remains appropriate    Co-evaluation              AM-PAC PT "6 Clicks" Mobility   Outcome Measure  Help needed turning from your back to your side while in a flat bed without using bedrails?: A Little Help needed moving from lying on your back to sitting on the side of a flat bed without using bedrails?: A Little Help needed moving to and from a bed to a chair (including a wheelchair)?: A Little Help needed standing up from a  chair using your arms (e.g., wheelchair or bedside chair)?: A Little Help needed to walk in hospital room?: A Little Help needed climbing 3-5 steps with a railing? : A Little 6 Click Score: 18    End of Session Equipment Utilized During Treatment: Gait belt Activity Tolerance: Patient tolerated treatment well Patient left: in bed;with call bell/phone within reach;with bed alarm set   PT Visit Diagnosis: Unsteadiness on feet (R26.81);Pain     Time: 3709-6438 PT Time Calculation (min) (ACUTE ONLY): 16 min  Charges:  $Gait Training: 8-22 mins                     Baxter Flattery, PT  Acute Rehab Dept ([place]) [phone] Pager [phone]  10/04/2021    [facility] 10/04/2021, 11:06 AM

## 2021-10-04 NOTE — Progress Notes (Signed)
PROGRESS NOTE    Jiah Bari Bonfiglio  ZJQ:[id number] DOB: [birth year] DOA: 09/26/2021 PCP: Sonia Side., [name]   Chief Complaint  Patient presents with   Failure To Thrive   Brief Narrative/Hospital Course:  Cristine Polio Gellert, 81 y.o. female with PMH of hypertension, hypothyroidism, history of GI AV malformations with chronic anemia, MDS presented with anemia, melanotic stool after visiting the ER 2 days prior to [place] without being seen due to long wait time.  Per report  Son brought patient as he had not heard from her in a day or so. He noted she was confused and may have missed several days of her meds. Son noted pt had black stools. Due to this she was brought to the ED. In the ED, VSS, labs showed hgb 8.3, worsened by 1G since 2 days ago. TSH elevated at 21.26. Hemoccult positive stools.  Patient was started on Protonix drip and admitted for further management. GI was consulted-underwent EGD push enteroscopy and colonoscopy and found to have a partially obstructing ascending colon malignancy.  Surgery was consulted-on robotic assisted hemicolectomy 12/30 and transferred to stepdown unit.  Postop did well transferred back to MedSurg 12/31   Subjective: Seen and examined this morning.  Patient is doing overall well Overnight no fever  Hemoglobin holding at 7.7 g  Had BM Patient feels well able to ambulate with PT. Family friend at the bedside.  Assessment & Plan: Ascending colon cancer with bleeding: s/p robotic assisted hemicolectomy 12/30 By [email or web address] doing well, having bowel movement, on dysphagia 1 diet advance as tolerated continue plan of care as per surgery encourage PT OT.  Surgery following closely  History of GI AVMs and diverticular bleeding Acute blood loss anemia in the setting of anemia of chronic disease: She had EGD and colonoscopy that showed colon cancer likely the cause of bleeding.S/p 2 U PRBCs 12/30, hemoglobin holding 7-8 gm. monitor hemoglobin,transfuse  if less than 7 g  Recent Labs  Lab 10/01/21 0600 10/01/21 1932 10/02/21 0255 10/03/21 0457 10/04/21 0426  HGB 6.9* 10.1* 7.4* 7.6* 7.7*  HCT 20.6* 31.3* 22.4* 23.0* 23.5*    Essential hypertension: Controlled on propanolol, continue to hold home amlodipine, labetalol  CKD Stage 3b: Baseline creatinine ranging from 1.5 to 2s, creatinine has down trended to 1.2 from 2.1 remains stable.  Monitor Recent Labs  Lab 09/30/21 0814 10/01/21 0600 10/02/21 0255 10/03/21 0457 10/04/21 0426  BUN 29* 19 16 24* 24*  CREATININE 2.15* 1.53* 1.27* 1.25* 1.32*    Thrombocytopenia: Unclear etiology could be multifactorial in the setting of postsurgical ,malignancy.  May need to hold lovenox if worsens-improving Recent Labs  Lab 09/30/21 0814 10/01/21 0600 10/02/21 0255 10/03/21 0457 10/04/21 0426  PLT 132* 101* 86* 88* 102*    Hypokalemia/hyponatremia: resolved Hypothyroidism: TSH was elevated 29 with free T4 normal 0.7.  Continue her Synthroid at 63 she was taking but will benefit with repeating 3 weeks   GERD: On PPI  Dementia without behavioral disturbance: Pleasant fairly oriented communicative.  Continue supportive measures   Pyurea/bacteriuria- no fever.  Monitor.   Leukocytosis likely postop/reactive -no fever.Monitor  DVT prophylaxis: enoxaparin (LOVENOX) injection 30 mg Start: 10/02/21 1000 Place and maintain sequential compression device Start: 10/02/21 0651 SCDs Start: 09/26/21 2224 Code Status:   Code Status: Full Code Family Communication: plan of care discussed with patient at bedside.  Son updated on the phone previously. Status is: Inpatient Remains inpatient appropriate because: Ongoing postop management Disposition: Currently not medically stable for  discharge. Anticipated Disposition: Home with home health PT OT once cleared by surgery-likely in 1 to 2 days, patient planning to go to son's house in [place] where he has steps to walk up.  Objective: Vitals last  24 hrs: Vitals:   10/03/21 0541 10/03/21 1400 10/03/21 2128 10/04/21 0531  BP: 135/77 118/73 112/71 114/67  Pulse: 71 86 82 77  Resp: 16 18 16 16   Temp: 98.2 F (36.8 C) 99.1 F (37.3 C) 99 F (37.2 C) 99.6 F (37.6 C)  TempSrc: Oral Oral Oral Oral  SpO2: 100% 99% 99% 100%  Weight:      Height:       Weight change:   Intake/Output Summary (Last 24 hours) at 10/04/2021 1314 Last data filed at 10/04/2021 1220 Gross per 24 hour  Intake 1200 ml  Output 300 ml  Net 900 ml   Net IO Since Admission: 9,144.83 mL [10/04/21 1314]   Physical Examination: General exam: AAOx at baseline, pleasant,older than stated age, weak appearing. HEENT:Oral mucosa moist, Ear/Nose WNL grossly, dentition normal. Respiratory system: bilaterally diminished, no use of accessory muscle Cardiovascular system: S1 & S2 +, No JVD,. Gastrointestinal system: Abdomen soft, surgical site with dressing in place clean intact , mild tenderness,ND, BS+ Nervous System:Alert, awake, moving extremities and grossly nonfocal Extremities: no edema, distal peripheral pulses palpable.  Skin: No rashes,no icterus. MSK: Normal muscle bulk,tone, power   Medications reviewed:  Scheduled Meds:  acetaminophen  1,000 mg Oral Q8H   Chlorhexidine Gluconate Cloth  6 each Topical Daily   donepezil  10 mg Oral Daily   enoxaparin (LOVENOX) injection  30 mg Subcutaneous Q24H   feeding supplement  237 mL Oral BID BM   gabapentin  200 mg Oral TID   levothyroxine  75 mcg Oral q morning   memantine  10 mg Oral BID   pantoprazole  40 mg Oral BID   polycarbophil  625 mg Oral BID   Continuous Infusions:  methocarbamol (ROBAXIN) IV Stopped (10/02/21 0130)    Diet Order             DIET - DYS 1 Room service appropriate? Yes; Fluid consistency: Thin  Diet effective now                 Weight change:   Wt Readings from Last 3 Encounters:  10/02/21 56.5 kg  09/24/21 63.5 kg  07/16/21 60.6 kg     Consultants:see note   Procedures:see note  EGD and colonoscopy as below: - The examined portion of the jejunum was normal. - Normal examined duodenum. - Gastritis. - Normal esophagus. - No specimens collected.  Preparation of the colon was poor. - Rule out malignancy, partially obstructing tumor in the ascending colon. Biopsied. Tattooed. - Moderate diverticulosis in the sigmoid colon, in the descending colon, in the transverse colon and in the ascending colon. - Non-bleeding internal hemorrhoids.  Antimicrobials: Anti-infectives (From admission, onward)    Start     Dose/Rate Route Frequency Ordered Stop   10/01/21 0600  clindamycin (CLEOCIN) IVPB 900 mg       See Hyperspace for full Linked Orders Report.   900 mg 100 mL/hr over 30 Minutes Intravenous 60 min pre-op 09/30/21 1448 10/01/21 1040   10/01/21 0600  gentamicin (GARAMYCIN) 310 mg in dextrose 5 % 100 mL IVPB       See Hyperspace for full Linked Orders Report.   5 mg/kg  61 kg 107.8 mL/hr over 60 Minutes Intravenous 60 min pre-op 09/30/21  1448 10/01/21 1106   09/30/21 1447  neomycin (MYCIFRADIN) tablet 1,000 mg  Status:  Discontinued        1,000 mg Oral As directed 09/30/21 1448 09/30/21 1544      Culture/Microbiology    Component Value Date/Time   SDES  09/27/2021 0908    URINE, CLEAN CATCH Performed at [facility], [place] [address], [place], [place] [address]    SPECREQUEST  09/27/2021 0908    NONE Performed at [facility], [place] [address], [place], [place] [address]    CULT (A) 09/27/2021 0908    30,000 COLONIES/mL STREPTOCOCCUS AGALACTIAE TESTING AGAINST S. AGALACTIAE NOT ROUTINELY PERFORMED DUE TO PREDICTABILITY OF AMP/PEN/VAN SUSCEPTIBILITY. Performed at [facility], [place] [address] [place], [place] [address]    REPTSTATUS 09/28/2021 FINAL 09/27/2021 0908    Other culture-see note  Unresulted Labs (From admission, onward)     Start     Ordered   10/08/21 0500  Creatinine,  serum  (enoxaparin (LOVENOX)  CrCl >/= 30 mL/min  )  Weekly,   R     Comments: while on enoxaparin therapy.   Question:  Specimen collection method  Answer:  Lab=Lab collect   10/01/21 1610   10/03/21 0500  Hemoglobin  Daily,   R     Question:  Specimen collection method  Answer:  Lab=Lab collect   10/02/21 0754   10/03/21 0500  CBC  Daily,   R     Question:  Specimen collection method  Answer:  Lab=Lab collect   10/02/21 1012   10/03/21 5361  Basic metabolic panel  Daily,   R     Question:  Specimen collection method  Answer:  Lab=Lab collect   10/02/21 1012          Data Reviewed: I have personally reviewed following labs and imaging studies CBC: Recent Labs  Lab 09/28/21 0523 09/29/21 0539 09/30/21 0814 10/01/21 0600 10/01/21 1932 10/02/21 0255 10/03/21 0457 10/04/21 0426  WBC 5.3 5.3 9.0 6.2  --  14.3* 10.3 7.4  NEUTROABS 3.3 3.0 6.5 4.0  --  12.7*  --   --   HGB 7.8* 8.5* 7.7* 6.9* 10.1* 7.4* 7.6* 7.7*  HCT 24.7* 26.2* 24.0* 20.6* 31.3* 22.4* 23.0* 23.5*  MCV 74.6* 73.4* 72.9* 72.3*  --  76.2* 76.2* 77.0*  PLT 140* 143* 132* 101*  --  86* 88* 443*   Basic Metabolic Panel: Recent Labs  Lab 09/30/21 0814 10/01/21 0600 10/02/21 0255 10/03/21 0457 10/04/21 0426  NA 141 135 136 139 138  K 3.5 3.3* 4.0 3.7 3.8  CL 109 106 107 109 110  CO2 24 24 20* 22 23  GLUCOSE 114* 95 144* 79 74  BUN 29* 19 16 24* 24*  CREATININE 2.15* 1.53* 1.27* 1.25* 1.32*  CALCIUM 8.7* 8.2* 7.6* 7.8* 8.0*  MG  --   --   --  1.4*  --   PHOS 2.5  --   --   --   --    GFR: Estimated Creatinine Clearance: 26 mL/min (A) (by C-G formula based on SCr of 1.32 mg/dL (H)). Liver Function Tests: No results for input(s): AST, ALT, ALKPHOS, BILITOT, PROT, ALBUMIN in the last 168 hours.  No results for input(s): LIPASE, AMYLASE in the last 168 hours. No results for input(s): AMMONIA in the last 168 hours. Coagulation Profile: No results for input(s): INR, PROTIME in the last 168  hours. Cardiac Enzymes: No results for input(s): CKTOTAL, CKMB,  CKMBINDEX, TROPONINI in the last 168 hours. BNP (last 3 results) No results for input(s): PROBNP in the last 8760 hours. HbA1C: No results for input(s): HGBA1C in the last 72 hours.  CBG: No results for input(s): GLUCAP in the last 168 hours. Lipid Profile: No results for input(s): CHOL, HDL, LDLCALC, TRIG, CHOLHDL, LDLDIRECT in the last 72 hours. Thyroid Function Tests: No results for input(s): TSH, T4TOTAL, FREET4, T3FREE, THYROIDAB in the last 72 hours.  Anemia Panel: No results for input(s): VITAMINB12, FOLATE, FERRITIN, TIBC, IRON, RETICCTPCT in the last 72 hours. Sepsis Labs: No results for input(s): PROCALCITON, LATICACIDVEN in the last 168 hours.  Recent Results (from the past 240 hour(s))  Resp Panel by RT-PCR (Flu A&B, Covid) Nasopharyngeal Swab     Status: None   Collection Time: 09/26/21  7:12 PM   Specimen: Nasopharyngeal Swab; Nasopharyngeal(NP) swabs in vial transport medium  Result Value Ref Range Status   SARS Coronavirus 2 by RT PCR NEGATIVE NEGATIVE Final    Comment: (NOTE) SARS-CoV-2 target nucleic acids are NOT DETECTED.  The SARS-CoV-2 RNA is generally detectable in upper respiratory specimens during the acute phase of infection. The lowest concentration of SARS-CoV-2 viral copies this assay can detect is 138 copies/mL. A negative result does not preclude SARS-Cov-2 infection and should not be used as the sole basis for treatment or other patient management decisions. A negative result may occur with  improper specimen collection/handling, submission of specimen other than nasopharyngeal swab, presence of viral mutation(s) within the areas targeted by this assay, and inadequate number of viral copies(<138 copies/mL). A negative result must be combined with clinical observations, patient history, and epidemiological information. The expected result is Negative.  Fact Sheet for Patients:   EntrepreneurPulse.com.au  Fact Sheet for Healthcare Providers:  IncredibleEmployment.be  This test is no t yet approved or cleared by the [place] FDA and  has been authorized for detection and/or diagnosis of SARS-CoV-2 by FDA under an Emergency Use Authorization (EUA). This EUA will remain  in effect (meaning this test can be used) for the duration of the COVID-19 declaration under Section 564(b)(1) of the Act, 21 U.S.C.section 360bbb-3(b)(1), unless the authorization is terminated  or revoked sooner.       Influenza A by PCR NEGATIVE NEGATIVE Final   Influenza B by PCR NEGATIVE NEGATIVE Final    Comment: (NOTE) The Xpert Xpress SARS-CoV-2/FLU/RSV plus assay is intended as an aid in the diagnosis of influenza from Nasopharyngeal swab specimens and should not be used as a sole basis for treatment. Nasal washings and aspirates are unacceptable for Xpert Xpress SARS-CoV-2/FLU/RSV testing.  Fact Sheet for Patients: EntrepreneurPulse.com.au  Fact Sheet for Healthcare Providers: IncredibleEmployment.be  This test is not yet approved or cleared by the [place] FDA and has been authorized for detection and/or diagnosis of SARS-CoV-2 by FDA under an Emergency Use Authorization (EUA). This EUA will remain in effect (meaning this test can be used) for the duration of the COVID-19 declaration under Section 564(b)(1) of the Act, 21 U.S.C. section 360bbb-3(b)(1), unless the authorization is terminated or revoked.  Performed at [facility], [place] [address] [place], [place] [address]   Urine Culture     Status: Abnormal   Collection Time: 09/27/21  9:08 AM   Specimen: Urine, Clean Catch  Result Value Ref Range Status   Specimen Description   Final    URINE, CLEAN CATCH Performed at [facility], [place] [address], [place], [place] [address]  Special Requests    Final    NONE Performed at [facility], [place] [address], [place], [place] [address]    Culture (A)  Final    30,000 COLONIES/mL STREPTOCOCCUS AGALACTIAE TESTING AGAINST S. AGALACTIAE NOT ROUTINELY PERFORMED DUE TO PREDICTABILITY OF AMP/PEN/VAN SUSCEPTIBILITY. Performed at [facility], [place] [address], [place], [place] [address]    Report Status 09/28/2021 FINAL  Final  MRSA Next Gen by PCR, Nasal     Status: None   Collection Time: 10/01/21  6:19 PM   Specimen: Nasal Mucosa; Nasal Swab  Result Value Ref Range Status   MRSA by PCR Next Gen NOT DETECTED NOT DETECTED Final    Comment: (NOTE) The GeneXpert MRSA Assay (FDA approved for NASAL specimens only), is one component of a comprehensive MRSA colonization surveillance program. It is not intended to diagnose MRSA infection nor to guide or monitor treatment for MRSA infections. Test performance is not FDA approved in patients less than 61 years old. Performed at [facility], [place] [address] [place], [place] [address]      Radiology Studies: No results found.   LOS: 7 days   Antonieta Pert, MD [facility]  10/04/2021, 1:14 PM

## 2021-10-04 NOTE — TOC Initial Note (Signed)
Transition of Care [facility]) - Initial/Assessment Note    Patient Details  Name: Susan [name] MRN: [id number] Date of Birth: January 23, 1941  Transition of Care [facility]) CM/SW Contact:    Lennart Pall, LCSW Phone Number: 10/04/2021, 12:49 PM  Clinical Narrative:                 Met with pt and spoke with son, Selinda Flavin, via phone to introduce self/ TOC role with dc planning.  Pt confirms that she lives alone in [place].  Most family living in the [place] area including her son.  Son reports that family is planning for pt to stay with one of them in [place] at time of dc so she can have needed support.  Son's concerns are that his home has 3 flights of steps to negotiate and another possible location has one flight.  Have alerted PT to this and asked that they offer feedback on her ability to climb steps.  Both area aware that HHPT/OT has been recommended and are agreeable with no agency preference.  TOC will make referrals once definite dc location determined.  Expected Discharge Plan: [place] Barriers to Discharge: Continued Medical Work up   Patient Goals and CMS Choice Patient states their goals for this hospitalization and ongoing recovery are:: eventually be able to return home      Expected Discharge Plan and Services Expected Discharge Plan: [place] In-house Referral: Clinical Social Work     Living arrangements for the past 2 months: Apartment                                      Prior Living Arrangements/Services Living arrangements for the past 2 months: Apartment Lives with:: Self Patient language and need for interpreter reviewed:: Yes Do you feel safe going back to the place where you live?: Yes      Need for Family Participation in Patient Care: Yes (Comment) Care giver support system in place?: Yes (comment)   Criminal Activity/Legal Involvement Pertinent to Current Situation/Hospitalization: No - Comment as  needed  Activities of Daily Living Home Assistive Devices/Equipment: Walker (specify type) ADL Screening (condition at time of admission) Patient's cognitive ability adequate to safely complete daily activities?: Yes Is the patient deaf or have difficulty hearing?: No Does the patient have difficulty seeing, even when wearing glasses/contacts?: No Does the patient have difficulty concentrating, remembering, or making decisions?: Yes Patient able to express need for assistance with ADLs?: Yes Does the patient have difficulty dressing or bathing?: No Independently performs ADLs?: No Communication: Independent Bathing: Needs assistance Is this a change from baseline?: Pre-admission baseline Toileting: Needs assistance Is this a change from baseline?: Pre-admission baseline In/Out Bed: Appropriate for developmental age 36 in Home: Independent with device (comment) Does the patient have difficulty walking or climbing stairs?: No Weakness of Legs: Both Weakness of Arms/Hands: Both  Permission Sought/Granted Permission sought to share information with : Family Supports Permission granted to share information with : Yes, Verbal Permission Granted  Share Information with NAME: Duwaine Maxin     Permission granted to share info w Relationship: son  Permission granted to share info w Contact Information: [phone]  Emotional Assessment Appearance:: Appears stated age Attitude/Demeanor/Rapport: Gracious Affect (typically observed): Accepting, Pleasant Orientation: : Oriented to Self, Oriented to Place, Oriented to  Time, Oriented to Situation Alcohol / Substance Use: Not Applicable Psych Involvement:  No (comment)  Admission diagnosis:  GI bleeding [K92.2] GI bleed [K92.2] Patient Active Problem List   Diagnosis Date Noted   Inguinal hernia, left 10/02/2021   Cancer of ascending colon ([place]) 09/30/2021   IDA (iron deficiency anemia) 09/30/2021   Carpal tunnel syndrome of left  wrist 09/30/2021   GI bleed 09/27/2021   GI bleeding 09/26/2021   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   Gastritis without bleeding    Melena    Thrombocytopenia ([place]) 05/03/2021   Allergic rhinitis 03/13/2021   Acute on chronic blood loss anemia 01/08/2021   Cerebral vascular disease 06/09/2020   Pain due to onychomycosis of toenails of both feet 04/08/2020   Porokeratosis 04/08/2020   Dementia without behavioral disturbance ([place]) 02/27/2020   Late onset Alzheimer's disease without behavioral disturbance ([place]) 06/12/2019   Gouty arthritis 05/28/2019   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   BMI 29.0-29.9,adult 09/02/2015   Encounter for Medicare annual wellness exam 06/02/2015   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[place]) 11/03/2014   CKD, 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) 11/02/2014   Medication management 01/27/2014   Hyperlipidemia 08/27/2013   Essential hypertension    Hypothyroidism    GERD (gastroesophageal reflux disease)    Vitamin D deficiency    Hemorrhoids 07/15/2013   PCP:  Sonia Side., [name] Pharmacy:   [place], [place] - [address] [place] [address] Phone: [phone] Fax: [phone]     Social Determinants of Health ([place]) Interventions    Readmission Risk Interventions Readmission Risk Prevention Plan 09/29/2021  Transportation Screening Complete  PCP or Specialist Appt within 5-7 Days Complete  Home Care Screening Complete  Medication Review (RN CM) Complete  Some recent data might be hidden

## 2021-10-05 LAB — CBC
HCT: 22.4 % — ABNORMAL LOW (ref 36.0–46.0)
Hemoglobin: 7.4 g/dL — ABNORMAL LOW (ref 12.0–15.0)
MCH: 25.3 pg — ABNORMAL LOW (ref 26.0–34.0)
MCHC: 33 g/dL (ref 30.0–36.0)
MCV: 76.7 fL — ABNORMAL LOW (ref 80.0–100.0)
Platelets: 118 K/uL — ABNORMAL LOW (ref 150–400)
RBC: 2.92 MIL/uL — ABNORMAL LOW (ref 3.87–5.11)
RDW: 22.8 % — ABNORMAL HIGH (ref 11.5–15.5)
WBC: 5.9 K/uL (ref 4.0–10.5)
nRBC: 0 % (ref 0.0–0.2)

## 2021-10-05 NOTE — Progress Notes (Signed)
PROGRESS NOTE    Susan [name]  OAC:[id number] DOB: [birth year] DOA: 09/26/2021 PCP: Sonia Side., [name]   Chief Complaint  Patient presents with   Failure To Thrive   Brief Narrative/Hospital Course:  Susan [name], 81 y.o. female with PMH of hypertension, hypothyroidism, history of GI AV malformations with chronic anemia, MDS presented with anemia, melanotic stool after visiting the ER 2 days prior to [place] without being seen due to long wait time.  Per report  Son brought patient as he had not heard from her in a day or so. He noted she was confused and may have missed several days of her meds. Son noted pt had black stools. Due to this she was brought to the ED. In the ED, VSS, labs showed hgb 8.3, worsened by 1G since 2 days ago. TSH elevated at 21.26. Hemoccult positive stools.  Patient was started on Protonix drip and admitted for further management. GI was consulted-underwent EGD push enteroscopy and colonoscopy and found to have a partially obstructing ascending colon malignancy.  Surgery was consulted-on robotic assisted hemicolectomy 12/30 and transferred to stepdown unit.  Postop did well transferred back to [place] 12/31.  Postop doing well, working with PT OT able to ambulate.   Subjective: Seen and examined this morning.  Patient tearful due to pain on Zimamox. Had multiple bowel movement yesterday. Overnight no fever seen and examined this morning.  Patient is doing overall well Overnight no fever  Hemoglobin in mid 7 g   Assessment & Plan: Ascending colon cancer with bleeding: s/p robotic assisted hemicolectomy 12/30 By DrGross.postop doing well, having bowel movement, on dysphagia 1 diet advance as tolerated continue plan of care as per surgery encourage PT OT.  History of GI AVMs and diverticular bleeding Acute blood loss anemia in the setting of anemia of chronic disease: She had EGD and colonoscopy that showed colon cancer likely the cause of bleeding.S/p  2 U PRBCs 12/30, hemoglobin holding mid 7 g range.  Monitor transfuse for less than 7 g.  No complaints of further bleeding.   Recent Labs  Lab 10/01/21 1932 10/02/21 0255 10/03/21 0457 10/04/21 0426 10/05/21 0356  HGB 10.1* 7.4* 7.6* 7.7* 7.4*  HCT 31.3* 22.4* 23.0* 23.5* 22.4*    Essential hypertension: Stable.holding home amlodipine, labetalol  CKD Stage 3b: Baseline creatinine ranging from 1.5 to 2s, creatinine improved to baseline value Recent Labs  Lab 09/30/21 0814 10/01/21 0600 10/02/21 0255 10/03/21 0457 10/04/21 0426  BUN 29* 19 16 24* 24*  CREATININE 2.15* 1.53* 1.27* 1.25* 1.32*    Thrombocytopenia: Unclear etiology could be multifactorial in the setting of postsurgical ,malignancy.  Increasing.  Continue Recent Labs  Lab 10/01/21 0600 10/02/21 0255 10/03/21 0457 10/04/21 0426 10/05/21 0356  PLT 101* 86* 88* 102* 118*    Hypokalemia/hyponatremia: resolved Hypothyroidism: TSH was elevated 29 with free T4 normal 0.7.  Continue her Synthroid at 31 she was taking but will benefit with repeating 3 weeks   GERD: On PPI  Dementia without behavioral disturbance:fairly oriented .Continue supportive measures   Pyurea/bacteriuria- no fever.  Monitor.   Leukocytosis likely postop/reactive -no fever.Monitor  DVT prophylaxis: enoxaparin (LOVENOX) injection 30 mg Start: 10/02/21 1000 Place and maintain sequential compression device Start: 10/02/21 0651 SCDs Start: 09/26/21 2224 Code Status:   Code Status: Full Code Family Communication: plan of care discussed with patient at bedside.  Son updated on the phone previously. Status is: Inpatient Remains inpatient appropriate because: Ongoing postop management Disposition: Currently not  medically stable for discharge. Anticipated Disposition: Plan is for discharge to son's house in [place] but will need to ascend 3 flights of stairs, may need more time to progress.  Surgically stable for discharge.     Objective: Vitals last 24 hrs: Vitals:   10/04/21 1318 10/04/21 2045 10/05/21 0500 10/05/21 0541  BP: 130/72 127/73  114/74  Pulse: 92 85  76  Resp: 14 17  17   Temp: 99 F (37.2 C) 99.2 F (37.3 C)  99.5 F (37.5 C)  TempSrc: Oral   Oral  SpO2: 99% 100%  100%  Weight:   59 kg   Height:       Weight change:   Intake/Output Summary (Last 24 hours) at 10/05/2021 1035 Last data filed at 10/05/2021 0811 Gross per 24 hour  Intake 1080 ml  Output --  Net 1080 ml   Net IO Since Admission: 9,984.83 mL [10/05/21 1035]   Physical Examination: General exam: AAOx baseline, alert, older than stated age, weak appearing. HEENT:Oral mucosa moist, Ear/Nose WNL grossly, dentition normal. Respiratory system: bilaterally clear, no use of accessory muscle Cardiovascular system: S1 & S2 +, No JVD,. Gastrointestinal system: Abdomen soft, surgical sites C/D/I, tender ,ND, BS+ Nervous System:Alert, awake, moving extremities and grossly nonfocal Extremities: no edema, distal peripheral pulses palpable.  Skin: No rashes,no icterus. MSK: Normal muscle bulk,tone, power    Medications reviewed:  Scheduled Meds:  acetaminophen  1,000 mg Oral Q8H   donepezil  10 mg Oral Daily   enoxaparin (LOVENOX) injection  30 mg Subcutaneous Q24H   feeding supplement  237 mL Oral BID BM   gabapentin  200 mg Oral TID   levothyroxine  75 mcg Oral q morning   memantine  10 mg Oral BID   pantoprazole  40 mg Oral BID   polycarbophil  625 mg Oral BID   Continuous Infusions:  methocarbamol (ROBAXIN) IV Stopped (10/02/21 0130)    Diet Order             DIET - DYS 1 Room service appropriate? Yes; Fluid consistency: Thin  Diet effective now                 Weight change:   Wt Readings from Last 3 Encounters:  10/05/21 59 kg  09/24/21 63.5 kg  07/16/21 60.6 kg     Consultants:see note  Procedures:see note  EGD and colonoscopy as below: - The examined portion of the jejunum was normal. - Normal  examined duodenum. - Gastritis. - Normal esophagus. - No specimens collected.  Preparation of the colon was poor. - Rule out malignancy, partially obstructing tumor in the ascending colon. Biopsied. Tattooed. - Moderate diverticulosis in the sigmoid colon, in the descending colon, in the transverse colon and in the ascending colon. - Non-bleeding internal hemorrhoids.  Antimicrobials: Anti-infectives (From admission, onward)    Start     Dose/Rate Route Frequency Ordered Stop   10/01/21 0600  clindamycin (CLEOCIN) IVPB 900 mg       See Hyperspace for full Linked Orders Report.   900 mg 100 mL/hr over 30 Minutes Intravenous 60 min pre-op 09/30/21 1448 10/01/21 1040   10/01/21 0600  gentamicin (GARAMYCIN) 310 mg in dextrose 5 % 100 mL IVPB       See Hyperspace for full Linked Orders Report.   5 mg/kg  61 kg 107.8 mL/hr over 60 Minutes Intravenous 60 min pre-op 09/30/21 1448 10/01/21 1106   09/30/21 1447  neomycin (MYCIFRADIN) tablet 1,000 mg  Status:  Discontinued        1,000 mg Oral As directed 09/30/21 1448 09/30/21 1544      Culture/Microbiology    Component Value Date/Time   SDES  09/27/2021 0908    URINE, CLEAN CATCH Performed at [facility], [place] [address], [place], [place] [address]    SPECREQUEST  09/27/2021 0908    NONE Performed at [facility], [place] [address] [place], [place] [address]    CULT (A) 09/27/2021 0908    30,000 COLONIES/mL STREPTOCOCCUS AGALACTIAE TESTING AGAINST S. AGALACTIAE NOT ROUTINELY PERFORMED DUE TO PREDICTABILITY OF AMP/PEN/VAN SUSCEPTIBILITY. Performed at [facility], [place] [address] [place], [place] [address]    REPTSTATUS 09/28/2021 FINAL 09/27/2021 0908    Other culture-see note  Unresulted Labs (From admission, onward)     Start     Ordered   10/08/21 0500  Creatinine, serum  (enoxaparin (LOVENOX)  CrCl >/= 30 mL/min  )  Weekly,   R     Comments: while on enoxaparin therapy.    Question:  Specimen collection method  Answer:  Lab=Lab collect   10/01/21 1610   10/03/21 0500  Hemoglobin  Daily,   R     Question:  Specimen collection method  Answer:  Lab=Lab collect   10/02/21 0754          Data Reviewed: I have personally reviewed following labs and imaging studies CBC: Recent Labs  Lab 09/29/21 0539 09/30/21 0814 10/01/21 0600 10/01/21 1932 10/02/21 0255 10/03/21 0457 10/04/21 0426 10/05/21 0356  WBC 5.3 9.0 6.2  --  14.3* 10.3 7.4 5.9  NEUTROABS 3.0 6.5 4.0  --  12.7*  --   --   --   HGB 8.5* 7.7* 6.9* 10.1* 7.4* 7.6* 7.7* 7.4*  HCT 26.2* 24.0* 20.6* 31.3* 22.4* 23.0* 23.5* 22.4*  MCV 73.4* 72.9* 72.3*  --  76.2* 76.2* 77.0* 76.7*  PLT 143* 132* 101*  --  86* 88* 102* 993*   Basic Metabolic Panel: Recent Labs  Lab 09/30/21 0814 10/01/21 0600 10/02/21 0255 10/03/21 0457 10/04/21 0426  NA 141 135 136 139 138  K 3.5 3.3* 4.0 3.7 3.8  CL 109 106 107 109 110  CO2 24 24 20* 22 23  GLUCOSE 114* 95 144* 79 74  BUN 29* 19 16 24* 24*  CREATININE 2.15* 1.53* 1.27* 1.25* 1.32*  CALCIUM 8.7* 8.2* 7.6* 7.8* 8.0*  MG  --   --   --  1.4*  --   PHOS 2.5  --   --   --   --    GFR: Estimated Creatinine Clearance: 26 mL/min (A) (by C-G formula based on SCr of 1.32 mg/dL (H)). Liver Function Tests: No results for input(s): AST, ALT, ALKPHOS, BILITOT, PROT, ALBUMIN in the last 168 hours.  No results for input(s): LIPASE, AMYLASE in the last 168 hours. No results for input(s): AMMONIA in the last 168 hours. Coagulation Profile: No results for input(s): INR, PROTIME in the last 168 hours. Cardiac Enzymes: No results for input(s): CKTOTAL, CKMB, CKMBINDEX, TROPONINI in the last 168 hours. BNP (last 3 results) No results for input(s): PROBNP in the last 8760 hours. HbA1C: No results for input(s): HGBA1C in the last 72 hours.  CBG: No results for input(s): GLUCAP in the last 168 hours. Lipid Profile: No results for input(s): CHOL, HDL, LDLCALC,  TRIG, CHOLHDL, LDLDIRECT in the last 72 hours. Thyroid Function Tests: No results for input(s): TSH, T4TOTAL, FREET4, T3FREE, THYROIDAB in  the last 72 hours.  Anemia Panel: No results for input(s): VITAMINB12, FOLATE, FERRITIN, TIBC, IRON, RETICCTPCT in the last 72 hours. Sepsis Labs: No results for input(s): PROCALCITON, LATICACIDVEN in the last 168 hours.  Recent Results (from the past 240 hour(s))  Resp Panel by RT-PCR (Flu A&B, Covid) Nasopharyngeal Swab     Status: None   Collection Time: 09/26/21  7:12 PM   Specimen: Nasopharyngeal Swab; Nasopharyngeal(NP) swabs in vial transport medium  Result Value Ref Range Status   SARS Coronavirus 2 by RT PCR NEGATIVE NEGATIVE Final    Comment: (NOTE) SARS-CoV-2 target nucleic acids are NOT DETECTED.  The SARS-CoV-2 RNA is generally detectable in upper respiratory specimens during the acute phase of infection. The lowest concentration of SARS-CoV-2 viral copies this assay can detect is 138 copies/mL. A negative result does not preclude SARS-Cov-2 infection and should not be used as the sole basis for treatment or other patient management decisions. A negative result may occur with  improper specimen collection/handling, submission of specimen other than nasopharyngeal swab, presence of viral mutation(s) within the areas targeted by this assay, and inadequate number of viral copies(<138 copies/mL). A negative result must be combined with clinical observations, patient history, and epidemiological information. The expected result is Negative.  Fact Sheet for Patients:  EntrepreneurPulse.com.au  Fact Sheet for Healthcare Providers:  IncredibleEmployment.be  This test is no t yet approved or cleared by the [place] FDA and  has been authorized for detection and/or diagnosis of SARS-CoV-2 by FDA under an Emergency Use Authorization (EUA). This EUA will remain  in effect (meaning this test can be  used) for the duration of the COVID-19 declaration under Section 564(b)(1) of the Act, 21 U.S.C.section 360bbb-3(b)(1), unless the authorization is terminated  or revoked sooner.       Influenza A by PCR NEGATIVE NEGATIVE Final   Influenza B by PCR NEGATIVE NEGATIVE Final    Comment: (NOTE) The Xpert Xpress SARS-CoV-2/FLU/RSV plus assay is intended as an aid in the diagnosis of influenza from Nasopharyngeal swab specimens and should not be used as a sole basis for treatment. Nasal washings and aspirates are unacceptable for Xpert Xpress SARS-CoV-2/FLU/RSV testing.  Fact Sheet for Patients: EntrepreneurPulse.com.au  Fact Sheet for Healthcare Providers: IncredibleEmployment.be  This test is not yet approved or cleared by the [place] FDA and has been authorized for detection and/or diagnosis of SARS-CoV-2 by FDA under an Emergency Use Authorization (EUA). This EUA will remain in effect (meaning this test can be used) for the duration of the COVID-19 declaration under Section 564(b)(1) of the Act, 21 U.S.C. section 360bbb-3(b)(1), unless the authorization is terminated or revoked.  Performed at [facility], [place] [address] [place], [place] [address]   Urine Culture     Status: Abnormal   Collection Time: 09/27/21  9:08 AM   Specimen: Urine, Clean Catch  Result Value Ref Range Status   Specimen Description   Final    URINE, CLEAN CATCH Performed at [facility], [place] [address], [place], [place] [address]    Special Requests   Final    NONE Performed at [facility], [place] [address], [place], [place] [address]    Culture (A)  Final    30,000 COLONIES/mL STREPTOCOCCUS AGALACTIAE TESTING AGAINST S. AGALACTIAE NOT ROUTINELY PERFORMED DUE TO PREDICTABILITY OF AMP/PEN/VAN SUSCEPTIBILITY. Performed at [facility], [place] [address], [place], [place] [address]    Report Status  09/28/2021 FINAL  Final  MRSA Next Gen by PCR, Nasal     Status: None   Collection Time: 10/01/21  6:19 PM   Specimen: Nasal Mucosa; Nasal Swab  Result Value Ref Range Status   MRSA by PCR Next Gen NOT DETECTED NOT DETECTED Final    Comment: (NOTE) The GeneXpert MRSA Assay (FDA approved for NASAL specimens only), is one component of a comprehensive MRSA colonization surveillance program. It is not intended to diagnose MRSA infection nor to guide or monitor treatment for MRSA infections. Test performance is not FDA approved in patients less than 19 years old. Performed at [facility], [place] [address], [place], [place] [address]      Radiology Studies: No results found.   LOS: 8 days   Antonieta Pert, MD [facility]  10/05/2021, 10:35 AM

## 2021-10-05 NOTE — Progress Notes (Signed)
Occupational Therapy Treatment Patient Details Name: Susan [name] MRN: [id number] DOB: [birth year] Today's Date: 10/05/2021   History of present illness Susan [name], 81 y.o. female with PMH of hypertension, hypothyroidism, history of GI AV malformations with chronic anemia, MDS presented with anemia, melanotic stool.  Post EGD and colonoscopy and found to have a partially obstructing ascending colon malignancy and ROBOTIC PROXIMAL COLECTOMY 12/30   OT comments  Patient progressing and showed improved ability to perform full sponge bath with combination of sitting and standing but with increased need to perform posterior peri hygiene for thoroughness and skin integrity, compared to previous session. Although pt had just awoken, she was motivated for OOB activities and required no encouragement. Patient remains limited by baseline post-op and Bil knee pain, cognitive deficits, generalized weakness and decreased activity tolerance along with deficits noted below. Pt continues to demonstrate good rehab potential and would benefit from continued skilled OT to increase safety and independence with ADLs and functional transfers to allow pt to return home safely and reduce caregiver burden and fall risk.    Recommendations for follow up therapy are one component of a multi-disciplinary discharge planning process, led by the attending physician.  Recommendations may be updated based on patient status, additional functional criteria and insurance authorization.    Follow Up Recommendations  Home health OT    Assistance Recommended at Discharge Frequent or constant Supervision/Assistance  Patient can return home with the following  A little help with bathing/dressing/bathroom;Assistance with cooking/housework;A little help with walking and/or transfers   Equipment Recommendations  None recommended by OT    Recommendations for Other Services      Precautions / Restrictions Precautions Precautions:  Fall Precaution Comments: abdominal incisions Restrictions Weight Bearing Restrictions: No       Mobility Bed Mobility Overal bed mobility: Needs Assistance Bed Mobility: Rolling;Sidelying to Sit;Sit to Sidelying Rolling: Min assist Sidelying to sit: Mod assist     Sit to sidelying: Min assist General bed mobility comments: Pt with some increased pain attempting to push trunk up from sidelying, therefore increased assist to Mod As was given to help pt manage pain.    Transfers Overall transfer level: Needs assistance Equipment used: Rolling walker (2 wheels) Transfers: Sit to/from Stand Sit to Stand: Min guard           General transfer comment: Min guard from multiple surfaces. Pt with good eccentric control when lowering to commode and EOB.     Balance Overall balance assessment: Mild deficits observed, not formally tested                                         ADL either performed or assessed with clinical judgement   ADL Overall ADL's : Needs assistance/impaired     Grooming: Min guard;Standing;Wash/dry face;Wash/dry hands   Upper Body Bathing: Set up;Supervision/ safety;Sitting Upper Body Bathing Details (indicate cue type and reason): Sponge bathing on commode Lower Body Bathing: Moderate assistance;Sit to/from stand;Sitting/lateral leans Lower Body Bathing Details (indicate cue type and reason): Pt able to perform bathing to upper legs and to anteior peri areas in standing, but required assitance for posterior peri hygiene x 2 (after bowel movement and again when pt thought she only had gas). Pt was educated tocheck for certain after "gas" that she is clean in order to protect skin integrity. Pt receptive to this. Upper Body Dressing : Sitting;Minimal  assistance Upper Body Dressing Details (indicate cue type and reason): For anterior and posterior gowns.     Toilet Transfer: Min guard;Comfort height toilet;Ambulation;Rolling walker (2  wheels)   Toileting- Clothing Manipulation and Hygiene: Moderate assistance;Sit to/from stand;Cueing for safety       Functional mobility during ADLs: Min guard;Rolling walker (2 wheels);Moderate assistance      Extremity/Trunk Assessment Upper Extremity Assessment Upper Extremity Assessment: Generalized weakness       Cervical / Trunk Assessment Cervical / Trunk Assessment: Kyphotic    Vision   Vision Assessment?: No apparent visual deficits   Perception Perception Perception: Within Functional Limits   Praxis Praxis Praxis: Intact    Cognition Arousal/Alertness:  (Sleepy as pt had just awoken as OT entered room) Behavior During Therapy: WFL for tasks assessed/performed Overall Cognitive Status: History of cognitive impairments - at baseline                                 General Comments: hx of dementia, memory deficits; appropriate duirng OT tx, follows commands consistently, converses appropriately          Exercises     Shoulder Instructions       General Comments      Pertinent Vitals/ Pain       Pain Assessment: 0-10 Pain Score: 5  Pain Location: Bil knee pain Pain Descriptors / Indicators: Sore;Aching Pain Intervention(s): Limited activity within patient's tolerance;Monitored during session;Repositioned;Patient requesting pain meds-RN notified  Home Living                                          Prior Functioning/Environment              Frequency  Min 2X/week        Progress Toward Goals  OT Goals(current goals can now be found in the care plan section)  Progress towards OT goals: Progressing toward goals  Acute Rehab OT Goals Patient Stated Goal: Increase strength OT Goal Formulation: With patient Time For Goal Achievement: 10/15/21 Potential to Achieve Goals: Good  Plan Discharge plan remains appropriate    Co-evaluation                 AM-PAC OT "6 Clicks" Daily Activity      Outcome Measure   Help from another person eating meals?: None Help from another person taking care of personal grooming?: A Little Help from another person toileting, which includes using toliet, bedpan, or urinal?: A Lot Help from another person bathing (including washing, rinsing, drying)?: A Little Help from another person to put on and taking off regular upper body clothing?: A Little Help from another person to put on and taking off regular lower body clothing?: A Little 6 Click Score: 18    End of Session Equipment Utilized During Treatment: Rolling walker (2 wheels);Gait belt  OT Visit Diagnosis: Unsteadiness on feet (R26.81);Muscle weakness (generalized) (M62.81)   Activity Tolerance Patient tolerated treatment well   Patient Left in bed;with call bell/phone within reach;with bed alarm set   Nurse Communication Patient requests pain meds        Time: 0037-0488 OT Time Calculation (min): 39 min  Charges: OT General Charges $OT Visit: 1 Visit OT Treatments $Self Care/Home Management : 23-37 mins $Therapeutic Activity: 8-22 mins  Anderson [place], [place] Office:  [phone] 10/05/2021  Julien Girt 10/05/2021, 11:12 AM

## 2021-10-05 NOTE — Progress Notes (Signed)
4 Days Post-Op   Subjective/Chief Complaint: Afebrile, vitals stable. Passing flatus, had multiple bowel movements yesterday. Denies nausea/vomiting.   Objective: Vital signs in last 24 hours: Temp:  [99 F (37.2 C)-99.5 F (37.5 C)] 99.5 F (37.5 C) (01/03 0541) Pulse Rate:  [76-92] 76 (01/03 0541) Resp:  [14-17] 17 (01/03 0541) BP: (114-130)/(72-74) 114/74 (01/03 0541) SpO2:  [99 %-100 %] 100 % (01/03 0541) Weight:  [59 kg] 59 kg (01/03 0500) Last BM Date: 10/04/21  Intake/Output from previous day: 01/02 0701 - 01/03 0700 In: 1080 [P.O.:1080] Out: -  Intake/Output this shift: Total I/O In: 240 [P.O.:240] Out: -   General: resting comfortably, NAD Neuro: alert and oriented, no focal deficits Resp: normal work of breathing Abdomen: soft, nondistended, nontender to palpation. Incisions clean and dry with no erythema, induration or drainage. Extremities: warm and well-perfused   Lab Results:  Recent Labs    10/04/21 0426 10/05/21 0356  WBC 7.4 5.9  HGB 7.7* 7.4*  HCT 23.5* 22.4*  PLT 102* 118*   BMET Recent Labs    10/03/21 0457 10/04/21 0426  NA 139 138  K 3.7 3.8  CL 109 110  CO2 22 23  GLUCOSE 79 74  BUN 24* 24*  CREATININE 1.25* 1.32*  CALCIUM 7.8* 8.0*   PT/INR No results for input(s): LABPROT, INR in the last 72 hours. ABG No results for input(s): PHART, HCO3 in the last 72 hours.  Invalid input(s): PCO2, PO2  Studies/Results: No results found.  Anti-infectives: Anti-infectives (From admission, onward)    Start     Dose/Rate Route Frequency Ordered Stop   10/01/21 0600  clindamycin (CLEOCIN) IVPB 900 mg       See Hyperspace for full Linked Orders Report.   900 mg 100 mL/hr over 30 Minutes Intravenous 60 min pre-op 09/30/21 1448 10/01/21 1040   10/01/21 0600  gentamicin (GARAMYCIN) 310 mg in dextrose 5 % 100 mL IVPB       See Hyperspace for full Linked Orders Report.   5 mg/kg  61 kg 107.8 mL/hr over 60 Minutes Intravenous 60 min  pre-op 09/30/21 1448 10/01/21 1106   09/30/21 1447  neomycin (MYCIFRADIN) tablet 1,000 mg  Status:  Discontinued        1,000 mg Oral As directed 09/30/21 1448 09/30/21 1544       Assessment/Plan: s/p Procedure(s): XI ROBOT ASSISTED RIGHT HEMICOLECTOMY, WITH TAP BLOCK, ASSESSMENT OF TISSUE PERFUSION USING FIREFLY (Right) POD4 Continue soft diet Ambulate PT following, recommending HHPT Surgical pathology pending VTE: lovenox Ok for discharge from surgical standpoint, however patient is planning to go home with son and will need to ascend 3 flights of stairs, may need more time to progress.   LOS: 8 days    Michaelle Birks, MD [facility] Surgery General, Hepatobiliary and Pancreatic Surgery 10/05/21 8:20 AM

## 2021-10-05 NOTE — Progress Notes (Signed)
Physical Therapy Treatment Patient Details Name: Susan [name] MRN: [id number] DOB: Mar 02, 1941 Today's Date: 10/05/2021   History of Present Illness Susan [name], 81 y.o. female with PMH of hypertension, hypothyroidism, history of GI AV malformations with chronic anemia, MDS presented with anemia, melanotic stool.  Post EGD and colonoscopy and found to have a partially obstructing ascending colon malignancy and ROBOTIC PROXIMAL COLECTOMY 12/30    PT Comments    Pt progressing toward goals, incr activity tolerance. Reviewed areas as noted below. One rest break needed during gait d/t R knee pain   Recommendations for follow up therapy are one component of a multi-disciplinary discharge planning process, led by the attending physician.  Recommendations may be updated based on patient status, additional functional criteria and insurance authorization.  Follow Up Recommendations  Home health PT     Assistance Recommended at Discharge Frequent or constant Supervision/Assistance  Patient can return home with the following     Equipment Recommendations  Rolling walker (2 wheels)    Recommendations for Other Services       Precautions / Restrictions Precautions Precautions: Fall Precaution Comments: abdominal incisions Restrictions Weight Bearing Restrictions: No     Mobility  Bed Mobility Overal bed mobility: Needs Assistance Bed Mobility: Rolling;Sidelying to Sit;Sit to Sidelying Rolling: Min guard Sidelying to sit: Min assist Supine to sit: Min assist;HOB elevated   Sit to sidelying: Min assist General bed mobility comments: light assist to lift LEs on to bed mostly d/t pain    Transfers Overall transfer level: Needs assistance Equipment used: Rolling walker (2 wheels) Transfers: Sit to/from Stand Sit to Stand: Min guard           General transfer comment: cues for hand placement    Ambulation/Gait Ambulation/Gait assistance: Min guard;Min assist Gait Distance  (Feet): 60 Feet Assistive device: Rolling walker (2 wheels) Gait Pattern/deviations: Step-through pattern;Decreased stride length       General Gait Details: assist to maneuver RW especially with turns. incr time. One brief rest d/t R knee pai.t   Stairs Stairs: Yes Stairs assistance: Min guard;Min assist Stair Management: Two rails;One rail Right;Step to pattern;Forwards Number of Stairs: 5 General stair comments: cues for sequence, min assist to min/guard to steady   Wheelchair Mobility    Modified Rankin (Stroke Patients Only)       Balance Overall balance assessment: Mild deficits observed, not formally tested                                          Cognition Arousal/Alertness: Awake/alert Behavior During Therapy: WFL for tasks assessed/performed Overall Cognitive Status: History of cognitive impairments - at baseline                                 General Comments: hx of dementia, memory deficits; appropriate duirng PT  tx, follows commands consistently, converses appropriately        Exercises      General Comments        Pertinent Vitals/Pain Pain Assessment: Faces Pain Score: 5  Faces Pain Scale: Hurts little more Pain Location: Bil knee pain Pain Descriptors / Indicators: Sore;Aching Pain Intervention(s): Limited activity within patient's tolerance;Monitored during session;Premedicated before session;Repositioned    Home Living  Prior Function            PT Goals (current goals can now be found in the care plan section) Acute Rehab PT Goals Patient Stated Goal: go to son's house PT Goal Formulation: With patient Time For Goal Achievement: 10/16/21 Potential to Achieve Goals: Good Progress towards PT goals: Progressing toward goals    Frequency    Min 3X/week      PT Plan Current plan remains appropriate    Co-evaluation              AM-PAC PT "6 Clicks"  Mobility   Outcome Measure  Help needed turning from your back to your side while in a flat bed without using bedrails?: A Little Help needed moving from lying on your back to sitting on the side of a flat bed without using bedrails?: A Little Help needed moving to and from a bed to a chair (including a wheelchair)?: A Little Help needed standing up from a chair using your arms (e.g., wheelchair or bedside chair)?: A Little Help needed to walk in hospital room?: A Little Help needed climbing 3-5 steps with a railing? : A Little 6 Click Score: 18    End of Session Equipment Utilized During Treatment: Gait belt Activity Tolerance: Patient tolerated treatment well Patient left: in bed;with call bell/phone within reach;with bed alarm set Nurse Communication: Mobility status PT Visit Diagnosis: Unsteadiness on feet (R26.81);Pain     Time: 8177-1165 PT Time Calculation (min) (ACUTE ONLY): 18 min  Charges:  $Gait Training: 8-22 mins                     Baxter Flattery, PT  Acute Rehab Dept ([place]) [phone] Pager [phone]  10/05/2021    [facility] 10/05/2021, 12:02 PM

## 2021-10-06 ENCOUNTER — Encounter: Payer: Self-pay | Admitting: Oncology

## 2021-10-06 LAB — HEMOGLOBIN: Hemoglobin: 7.3 g/dL — ABNORMAL LOW (ref 12.0–15.0)

## 2021-10-06 MED ORDER — FERROUS SULFATE 325 (65 FE) MG PO TABS
325.0000 mg | ORAL_TABLET | Freq: Every day | ORAL | Status: DC
  Administered 2021-10-07 – 2021-10-08 (×2): 325 mg via ORAL
  Filled 2021-10-06 (×2): qty 1

## 2021-10-06 MED ORDER — MONTELUKAST SODIUM 10 MG PO TABS
10.0000 mg | ORAL_TABLET | Freq: Every day | ORAL | Status: DC
  Administered 2021-10-06 – 2021-10-07 (×2): 10 mg via ORAL
  Filled 2021-10-06 (×2): qty 1

## 2021-10-06 NOTE — NC FL2 (Addendum)
[place] MEDICAID FL2 LEVEL OF CARE SCREENING TOOL     IDENTIFICATION  Patient Name: Susan [name] Birthdate: [birth year] Sex: female Admission Date (Current Location): 09/26/2021  [facility] and [place] Number:  Herbalist and Address:  [facility],  [place] [address], [place]      Provider Number: [id number]  Attending Physician Name and Address:  [place] Eric J, DO  Relative Name and Phone Number:  son, Duwaine Maxin @ [phone]    Current Level of Care: Hospital Recommended Level of Care: [place] Prior Approval Number:    Date Approved/Denied:   PASRR Number: [id number]  Discharge Plan: SNF    Current Diagnoses: Patient Active Problem List   Diagnosis Date Noted   Inguinal hernia, left 10/02/2021   Cancer of ascending colon ([place]) 09/30/2021   IDA (iron deficiency anemia) 09/30/2021   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   GI bleed 09/27/2021   GI bleeding 09/26/2021   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   Gastritis without bleeding    Melena    Thrombocytopenia ([place]) 05/03/2021   Allergic rhinitis 03/13/2021   Acute on chronic blood loss anemia 01/08/2021   Cerebral vascular disease 06/09/2020   Pain due to onychomycosis of toenails of both feet 04/08/2020   Porokeratosis 04/08/2020   Dementia without behavioral disturbance ([place]) 02/27/2020   Late onset Alzheimer's disease without behavioral disturbance ([place]) 06/12/2019   Gouty arthritis 05/28/2019   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   BMI 29.0-29.9,adult 09/02/2015   Encounter for Medicare annual wellness exam 06/02/2015   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[place]) 11/03/2014   CKD, 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) 11/02/2014   Medication management 01/27/2014   Hyperlipidemia 08/27/2013   Essential hypertension    Hypothyroidism    GERD (gastroesophageal reflux disease)    Vitamin D deficiency    Hemorrhoids 07/15/2013    Orientation  RESPIRATION BLADDER Height & Weight     Self, Time, Situation, Place  Normal Continent Weight: 130 lb 1.1 oz (59 kg) Height:  5\' 2"  (157.5 cm)  BEHAVIORAL SYMPTOMS/MOOD NEUROLOGICAL BOWEL NUTRITION STATUS      Continent    AMBULATORY STATUS COMMUNICATION OF NEEDS Skin   Limited Assist Verbally  (surgical incision only)                       Personal Care Assistance Level of Assistance  Bathing, Dressing Bathing Assistance: Limited assistance   Dressing Assistance: Limited assistance     Functional Limitations Info             [place]  PT (By licensed PT), OT (By licensed OT)     PT Frequency: 5x/wk OT Frequency: 5x/wk            Contractures Contractures Info: Not present    Additional Factors Info  Code Status, Allergies, Psychotropic Code Status Info: Full Allergies Info: Codeine, Penicillins, Bactrim, Clarithromycin, Flagyl (Metronidazole Hcl), Food, Pineapple, Sulfa Antibiotics Psychotropic Info: see MAR         Current Medications (10/06/2021):  This is the current hospital active medication list Current Facility-Administered Medications  Medication Dose Route Frequency Provider Last Rate Last Admin   acetaminophen (TYLENOL) tablet 1,000 mg  1,000 mg Oral Tor Netters, MD   1,000 mg at 10/06/21 1440   alum & mag hydroxide-simeth (MAALOX/MYLANTA) 200-200-20 MG/5ML suspension 30 mL  30 mL Oral Q6H PRN Michael Boston, MD  donepezil (ARICEPT) tablet 10 mg  10 mg Oral Daily Michael Boston, MD   10 mg at 10/06/21 0907   enoxaparin (LOVENOX) injection 30 mg  30 mg Subcutaneous Q24H Michael Boston, MD   30 mg at 10/06/21 8921   feeding supplement (ENSURE SURGERY) liquid 237 mL  237 mL Oral BID BM Michael Boston, MD   237 mL at 10/06/21 1441   fentaNYL (SUBLIMAZE) injection 12.5 mcg  12.5 mcg Intravenous Q2H PRN Antonieta Pert, MD       [START ON 10/07/2021] ferrous sulfate tablet 325 mg  325 mg Oral Q breakfast [place] Eric J, DO        gabapentin (NEURONTIN) capsule 200 mg  200 mg Oral TID Michael Boston, MD   200 mg at 10/06/21 1941   levothyroxine (SYNTHROID) tablet 75 mcg  75 mcg Oral q morning Michael Boston, MD   75 mcg at 10/05/21 0520   lip balm (CARMEX) ointment   Topical PRN Michael Boston, MD       magic mouthwash  15 mL Oral QID PRN Michael Boston, MD       memantine [facility]) tablet 10 mg  10 mg Oral BID Michael Boston, MD   10 mg at 10/06/21 7408   menthol-cetylpyridinium (CEPACOL) lozenge 3 mg  1 lozenge Oral PRN Michael Boston, MD       methocarbamol (ROBAXIN) 500 mg in dextrose 5 % 50 mL IVPB  500 mg Intravenous Q6H PRN Michael Boston, MD   Stopped at 10/02/21 0130   methocarbamol (ROBAXIN) tablet 500 mg  500 mg Oral Q6H PRN Michael Boston, MD   500 mg at 10/05/21 1024   montelukast (SINGULAIR) tablet 10 mg  10 mg Oral QHS [place] Eric J, DO       ondansetron [facility]) tablet 4 mg  4 mg Oral Q6H PRN Michael Boston, MD       Or   ondansetron [facility]) injection 4 mg  4 mg Intravenous Q6H PRN Michael Boston, MD       pantoprazole (PROTONIX) EC tablet 40 mg  40 mg Oral BID Michael Boston, MD   40 mg at 10/06/21 1448   phenol (CHLORASEPTIC) mouth spray 2 spray  2 spray Mouth/Throat PRN Michael Boston, MD       polycarbophil (FIBERCON) tablet 625 mg  625 mg Oral BID Michael Boston, MD   625 mg at 10/06/21 1856   simethicone (MYLICON) chewable tablet 40 mg  40 mg Oral Q6H PRN Michael Boston, MD         Discharge Medications: Please see discharge summary for a list of discharge medications.  Relevant Imaging Results:  Relevant Lab Results:   Additional Information SS# [id number]  Lennart Pall, LCSW

## 2021-10-06 NOTE — Progress Notes (Signed)
5 Days Post-Op   Subjective/Chief Complaint: Afebrile, vitals stable. Passing flatus, had multiple bowel movements yesterday. Denies nausea/vomiting. Tolerating PO but wants diet advanced.    Objective: Vital signs in last 24 hours: Temp:  [97.9 F (36.6 C)-100.2 F (37.9 C)] 97.9 F (36.6 C) (01/04 0553) Pulse Rate:  [67-98] 67 (01/04 0553) Resp:  [15-17] 16 (01/04 0553) BP: (116-122)/(73-82) 122/80 (01/04 0553) SpO2:  [99 %-100 %] 99 % (01/03 2138) Last BM Date: 10/04/21  Intake/Output from previous day: 01/03 0701 - 01/04 0700 In: 840 [P.O.:840] Out: 0  Intake/Output this shift: No intake/output data recorded.  General: resting comfortably, NAD Neuro: alert and oriented, no focal deficits Resp: normal work of breathing Abdomen: soft, nondistended, appropriately tender around trochar sites. Incisions clean and dry with no erythema, induration or drainage. Extremities: warm and well-perfused   Lab Results:  Recent Labs    10/04/21 0426 10/05/21 0356 10/06/21 0426  WBC 7.4 5.9  --   HGB 7.7* 7.4* 7.3*  HCT 23.5* 22.4*  --   PLT 102* 118*  --    BMET Recent Labs    10/04/21 0426  NA 138  K 3.8  CL 110  CO2 23  GLUCOSE 74  BUN 24*  CREATININE 1.32*  CALCIUM 8.0*   PT/INR No results for input(s): LABPROT, INR in the last 72 hours. ABG No results for input(s): PHART, HCO3 in the last 72 hours.  Invalid input(s): PCO2, PO2  Studies/Results: No results found.  Anti-infectives: Anti-infectives (From admission, onward)    Start     Dose/Rate Route Frequency Ordered Stop   10/01/21 0600  clindamycin (CLEOCIN) IVPB 900 mg       See Hyperspace for full Linked Orders Report.   900 mg 100 mL/hr over 30 Minutes Intravenous 60 min pre-op 09/30/21 1448 10/01/21 1040   10/01/21 0600  gentamicin (GARAMYCIN) 310 mg in dextrose 5 % 100 mL IVPB       See Hyperspace for full Linked Orders Report.   5 mg/kg  61 kg 107.8 mL/hr over 60 Minutes Intravenous 60 min  pre-op 09/30/21 1448 10/01/21 1106   09/30/21 1447  neomycin (MYCIFRADIN) tablet 1,000 mg  Status:  Discontinued        1,000 mg Oral As directed 09/30/21 1448 09/30/21 1544       Assessment/Plan: s/p Procedure(s): XI ROBOT ASSISTED RIGHT HEMICOLECTOMY, WITH TAP BLOCK, ASSESSMENT OF TISSUE PERFUSION USING FIREFLY (Right) POD5 Continue soft diet Ambulate PT following, recommending HHPT Surgical pathology pending, will follow  VTE: lovenox Ok for discharge from surgical standpoint, however patient is planning to go home with son and will need to ascend 3 flights of stairs.   LOS: 9 days   Obie Dredge, [facility] Surgery General, Hepatobiliary and Pancreatic Surgery 10/06/21 9:49 AM

## 2021-10-06 NOTE — Progress Notes (Signed)
PROGRESS NOTE    Susan [name]  VWU:[id number] DOB: January 23, 1941 DOA: 09/26/2021 PCP: Sonia Side., [name]    Brief Narrative:  Susan [name] Is a 81 year old female with past medical history significant for essential hypertension, hypothyroidism, history of GI AVMs with chronic anemia, MDS who presented to [facility] ED on 12/25 with weakness, dark melanotic stools.  Patient was found by son in which she was confused.  Patient was seen in the ER 2 days prior to admission but left without being seen due to long wait times.  In the ED, hemoglobin 8.3 which was decreased by 1 g over the past 2 days, TSH elevated 21.26 and Hemoccult positive stools.  Patient was started on a Protonix drip.  Gastroenterology was consulted and hospital service consulted for further evaluation and management of anemia concern for GI bleed.   Assessment & Plan:   Principal Problem:   Cancer of ascending colon ([place]) Active Problems:   Essential hypertension   Hypothyroidism   GERD (gastroesophageal reflux disease)   CKD, 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)   Dementia without behavioral disturbance (HCC)   Acute on chronic blood loss anemia   Melena   GI bleeding   Hx of arteriovenous malformation (AVM)   GI bleed   IDA (iron deficiency anemia)   Inguinal hernia, left   Ascending colon cancer Patient presenting to the ED with dark stools and was found to be FOBT positive with worsening anemia.  Hemoglobin dropped to 6.9 and transfused 2 unit PRBC on 12/30.  CEA elevated 470.  Patient underwent EGD, push enteroscopy and colonoscopy that was notable for partially obstructing ascending colon malignancy.  General surgery was consulted and patient underwent robotic assisted hemicolectomy on 10/01/2021 by Dr. Johney Maine. --Pathology: Pending --Soft diet  Acute on chronic blood loss anemia History of GI AVMs and diverticular bleeding Patient underwent EGD/colonoscopy showing colon cancer likely cause of bleeding.  Received 2 unit  PRBCs on 12/30. --Hgb 8.3>>6.9>10.1>7.6>7.7>7.4>7.3. --Holding home aspirin --Ferrous sulfate 325 mg p.o. daily --Protonix 40 mg p.o. twice daily --Monitor H&H daily  Essential hypertension At baseline on amlodipine 10 mg p.o. daily, labetalol 200 mg p.o. daily. --BP 122/80 this morning, well controlled off of antihypertensives --Continue monitor BP closely  CKD stage IIIb Baseline creatinine 1.5 to 2.0.  Thrombocytopenia Stable.  Hypernatremia: Resolved Hypokalemia: Resolved Sodium 146 on admission, potassium 3.3; repleted.  Now resolved with sodium 138 and potassium 3.8 on 10/04/2021.  Hypothyroidism TSH elevated 29 with free T4 normal at 0.7. --Levothyroxine 75 mcg p.o. daily --Repeat TFTs 3-4 weeks  GERD: Protonix 40 mg p.o. twice daily  Dementia without behavioral disturbance --Donepezil 10 mg p.o. daily --Namenda 10 mg p.o. twice daily  Leukocytosis: Resolved Likely postoperative/reactive without fever, now resolved.  Weakness/deconditioning/debility: --Pending SNF placement --Continue PT/OT efforts while inpatient    DVT prophylaxis: enoxaparin (LOVENOX) injection 30 mg Start: 10/02/21 1000 Place and maintain sequential compression device Start: 10/02/21 0651 SCDs Start: 09/26/21 2224   Code Status: Full Code Family Communication: No family present at bedside this morning  Disposition Plan:  Level of care: Med-Surg Status is: Inpatient  Remains inpatient appropriate because: Pending SNF placement, medically stable for discharge    Consultants:  [facility]; Dr. Essie Hart [facility] Cardiology for preoperative evaluation  Procedures:  EGD Colonoscopy Robotic proximal colectomy, Dr. Johney Maine 12/30  Antimicrobials:  Perioperative clindamycin, gentamicin   Subjective: Patient seen examined bedside, resting comfortably.  Continues with weakness, did ambulate some with PT yesterday.  Unlikely able to return home ultrasound test due to significant  stairs and currently looking into SNF placement.  Seen by general surgery this morning, patient wishing to advance diet further.  No other complaints or concerns at this time.  Denies headache, no visual changes, no chest pain, no palpitations, no shortness of breath, no abdominal pain, no fever/chills/night sweats, no nausea/vomiting/diarrhea, no abdominal pain, no paresthesias.  No acute events overnight per nurse staff.  Objective: Vitals:   10/05/21 0541 10/05/21 1334 10/05/21 2138 10/06/21 0553  BP: 114/74 116/73 122/82 122/80  Pulse: 76 83 98 67  Resp: 17 15 17 16   Temp: 99.5 F (37.5 C) 100.2 F (37.9 C) 99.9 F (37.7 C) 97.9 F (36.6 C)  TempSrc: Oral Oral Oral Oral  SpO2: 100% 100% 99%   Weight:      Height:        Intake/Output Summary (Last 24 hours) at 10/06/2021 1255 Last data filed at 10/06/2021 0600 Gross per 24 hour  Intake 600 ml  Output 0 ml  Net 600 ml   Filed Weights   10/01/21 0441 10/02/21 0500 10/05/21 0500  Weight: 59.8 kg 56.5 kg 59 kg    Examination:  General exam: Appears calm and comfortable, elderly in appearance Respiratory system: Clear to auscultation. Respiratory effort normal.  On room air Cardiovascular system: S1 & S2 heard, RRR. No JVD, murmurs, rubs, gallops or clicks. No pedal edema. Gastrointestinal system: Abdomen is nondistended, soft and nontender. No organomegaly or masses felt. Normal bowel sounds heard.  Surgical/trocar sites noted, clean/dry with no erythema or purulent drainage Central nervous system: Alert and oriented. No focal neurological deficits. Extremities: Symmetric 5 x 5 power. Skin: No rashes, lesions or ulcers Psychiatry: Judgement and insight appear normal. Mood & affect appropriate.     Data Reviewed: I have personally reviewed following labs and imaging studies  CBC: Recent Labs  Lab 09/30/21 0814 10/01/21 0600 10/01/21 1932 10/02/21 0255 10/03/21 0457 10/04/21 0426 10/05/21 0356 10/06/21 0426  WBC 9.0  6.2  --  14.3* 10.3 7.4 5.9  --   NEUTROABS 6.5 4.0  --  12.7*  --   --   --   --   HGB 7.7* 6.9* 10.1* 7.4* 7.6* 7.7* 7.4* 7.3*  HCT 24.0* 20.6* 31.3* 22.4* 23.0* 23.5* 22.4*  --   MCV 72.9* 72.3*  --  76.2* 76.2* 77.0* 76.7*  --   PLT 132* 101*  --  86* 88* 102* 118*  --    Basic Metabolic Panel: Recent Labs  Lab 09/30/21 0814 10/01/21 0600 10/02/21 0255 10/03/21 0457 10/04/21 0426  NA 141 135 136 139 138  K 3.5 3.3* 4.0 3.7 3.8  CL 109 106 107 109 110  CO2 24 24 20* 22 23  GLUCOSE 114* 95 144* 79 74  BUN 29* 19 16 24* 24*  CREATININE 2.15* 1.53* 1.27* 1.25* 1.32*  CALCIUM 8.7* 8.2* 7.6* 7.8* 8.0*  MG  --   --   --  1.4*  --   PHOS 2.5  --   --   --   --    GFR: Estimated Creatinine Clearance: 26 mL/min (A) (by C-G formula based on SCr of 1.32 mg/dL (H)). Liver Function Tests: No results for input(s): AST, ALT, ALKPHOS, BILITOT, PROT, ALBUMIN in the last 168 hours. No results for input(s): LIPASE, AMYLASE in the last 168 hours. No results for input(s): AMMONIA in the last 168 hours. Coagulation Profile: No results for input(s): INR, PROTIME in the  last 168 hours. Cardiac Enzymes: No results for input(s): CKTOTAL, CKMB, CKMBINDEX, TROPONINI in the last 168 hours. BNP (last 3 results) No results for input(s): PROBNP in the last 8760 hours. HbA1C: No results for input(s): HGBA1C in the last 72 hours. CBG: No results for input(s): GLUCAP in the last 168 hours. Lipid Profile: No results for input(s): CHOL, HDL, LDLCALC, TRIG, CHOLHDL, LDLDIRECT in the last 72 hours. Thyroid Function Tests: No results for input(s): TSH, T4TOTAL, FREET4, T3FREE, THYROIDAB in the last 72 hours. Anemia Panel: No results for input(s): VITAMINB12, FOLATE, FERRITIN, TIBC, IRON, RETICCTPCT in the last 72 hours. Sepsis Labs: No results for input(s): PROCALCITON, LATICACIDVEN in the last 168 hours.  Recent Results (from the past 240 hour(s))  Resp Panel by RT-PCR (Flu A&B, Covid) Nasopharyngeal  Swab     Status: None   Collection Time: 09/26/21  7:12 PM   Specimen: Nasopharyngeal Swab; Nasopharyngeal(NP) swabs in vial transport medium  Result Value Ref Range Status   SARS Coronavirus 2 by RT PCR NEGATIVE NEGATIVE Final    Comment: (NOTE) SARS-CoV-2 target nucleic acids are NOT DETECTED.  The SARS-CoV-2 RNA is generally detectable in upper respiratory specimens during the acute phase of infection. The lowest concentration of SARS-CoV-2 viral copies this assay can detect is 138 copies/mL. A negative result does not preclude SARS-Cov-2 infection and should not be used as the sole basis for treatment or other patient management decisions. A negative result may occur with  improper specimen collection/handling, submission of specimen other than nasopharyngeal swab, presence of viral mutation(s) within the areas targeted by this assay, and inadequate number of viral copies(<138 copies/mL). A negative result must be combined with clinical observations, patient history, and epidemiological information. The expected result is Negative.  Fact Sheet for Patients:  EntrepreneurPulse.com.au  Fact Sheet for Healthcare Providers:  IncredibleEmployment.be  This test is no t yet approved or cleared by the [place] FDA and  has been authorized for detection and/or diagnosis of SARS-CoV-2 by FDA under an Emergency Use Authorization (EUA). This EUA will remain  in effect (meaning this test can be used) for the duration of the COVID-19 declaration under Section 564(b)(1) of the Act, 21 U.S.C.section 360bbb-3(b)(1), unless the authorization is terminated  or revoked sooner.       Influenza A by PCR NEGATIVE NEGATIVE Final   Influenza B by PCR NEGATIVE NEGATIVE Final    Comment: (NOTE) The Xpert Xpress SARS-CoV-2/FLU/RSV plus assay is intended as an aid in the diagnosis of influenza from Nasopharyngeal swab specimens and should not be used as a sole  basis for treatment. Nasal washings and aspirates are unacceptable for Xpert Xpress SARS-CoV-2/FLU/RSV testing.  Fact Sheet for Patients: EntrepreneurPulse.com.au  Fact Sheet for Healthcare Providers: IncredibleEmployment.be  This test is not yet approved or cleared by the [place] FDA and has been authorized for detection and/or diagnosis of SARS-CoV-2 by FDA under an Emergency Use Authorization (EUA). This EUA will remain in effect (meaning this test can be used) for the duration of the COVID-19 declaration under Section 564(b)(1) of the Act, 21 U.S.C. section 360bbb-3(b)(1), unless the authorization is terminated or revoked.  Performed at [facility], [place] [address], [place], [place] [address]   Urine Culture     Status: Abnormal   Collection Time: 09/27/21  9:08 AM   Specimen: Urine, Clean Catch  Result Value Ref Range Status   Specimen Description   Final    URINE, CLEAN CATCH Performed at [facility], [place] [address] [place], [place] [address]    Special Requests   Final    NONE Performed at [facility], [place] [address], [place], [place] [address]    Culture (A)  Final    30,000 COLONIES/mL STREPTOCOCCUS AGALACTIAE TESTING AGAINST S. AGALACTIAE NOT ROUTINELY PERFORMED DUE TO PREDICTABILITY OF AMP/PEN/VAN SUSCEPTIBILITY. Performed at [facility], [place] [address] [place], [place] [address]    Report Status 09/28/2021 FINAL  Final  MRSA Next Gen by PCR, Nasal     Status: None   Collection Time: 10/01/21  6:19 PM   Specimen: Nasal Mucosa; Nasal Swab  Result Value Ref Range Status   MRSA by PCR Next Gen NOT DETECTED NOT DETECTED Final    Comment: (NOTE) The GeneXpert MRSA Assay (FDA approved for NASAL specimens only), is one component of a comprehensive MRSA colonization surveillance program. It is not intended to diagnose MRSA infection nor to guide or monitor  treatment for MRSA infections. Test performance is not FDA approved in patients less than 25 years old. Performed at [facility], [place] [address] [place], [place] [address]          Radiology Studies: No results found.      Scheduled Meds:  acetaminophen  1,000 mg Oral Q8H   donepezil  10 mg Oral Daily   enoxaparin (LOVENOX) injection  30 mg Subcutaneous Q24H   feeding supplement  237 mL Oral BID BM   gabapentin  200 mg Oral TID   levothyroxine  75 mcg Oral q morning   memantine  10 mg Oral BID   pantoprazole  40 mg Oral BID   polycarbophil  625 mg Oral BID   Continuous Infusions:  methocarbamol (ROBAXIN) IV Stopped (10/02/21 0130)     LOS: 9 days    Time spent: 39 minutes spent on chart review, discussion with nursing staff, consultants, updating family and interview/physical exam; more than 50% of that time was spent in counseling and/or coordination of care.    Sarae Nicholes J [place] DO [facility] Available via Epic secure chat 7am-7pm After these hours, please refer to coverage provider listed on [email or web address] 10/06/2021, 12:55 PM

## 2021-10-06 NOTE — Progress Notes (Signed)
Transition of Care (TOC) -30 day Note       Patient Details  Name:  MRN: Date of Birth:   Transition of Care [facility]) CM/SW Contact  Name: [facility] Phone Number: Date: 10/06/21 Time:1404   MUST ID: [id number]   To Whom it May Concern:   Please be advised that the above patient will require a short-term nursing home stay, anticipated 30 days or less rehabilitation and strengthening. The plan is for return home.

## 2021-10-06 NOTE — TOC Progression Note (Signed)
Transition of Care [facility]) - Progression Note    Patient Details  Name: Susan [name] MRN: [id number] Date of Birth: July 18, 1941  Transition of Care [facility]) CM/SW Contact  Lennart Pall, [place] Phone Number: 10/06/2021, 1:50 PM  Clinical Narrative:    Met with pt and spoke with son again to confirm dc plan.  PT continues to work with pt and, at this point, would still recommend 24/7 support at home. Son reports that family cannot provide this level of support currently and that he and patient are agreed on change of plan to SNF rehab.  Will begin bed search. MD aware.   Expected Discharge Plan: [place] Barriers to Discharge: Continued Medical Work up  Expected Discharge Plan and Services Expected Discharge Plan: [place] In-house Referral: Clinical Social Work     Living arrangements for the past 2 months: Apartment                                       Social Determinants of Health (SDOH) Interventions    Readmission Risk Interventions Readmission Risk Prevention Plan 09/29/2021  Transportation Screening Complete  PCP or Specialist Appt within 5-7 Days Complete  Home Care Screening Complete  Medication Review (RN CM) Complete  Some recent data might be hidden

## 2021-10-07 LAB — RESP PANEL BY RT-PCR (FLU A&B, COVID) ARPGX2
Influenza A by PCR: NEGATIVE
Influenza B by PCR: NEGATIVE
SARS Coronavirus 2 by RT PCR: NEGATIVE

## 2021-10-07 LAB — HEMOGLOBIN: Hemoglobin: 7.4 g/dL — ABNORMAL LOW (ref 12.0–15.0)

## 2021-10-07 NOTE — Progress Notes (Signed)
PROGRESS NOTE    Susan [name]  TLX:[id number] DOB: 1941/04/13 DOA: 09/26/2021 PCP: Sonia Side., [name]    Brief Narrative:  Susan [name] Is a 81 year old female with past medical history significant for essential hypertension, hypothyroidism, history of GI AVMs with chronic anemia, MDS who presented to [facility] ED on 12/25 with weakness, dark melanotic stools.  Patient was found by son in which she was confused.  Patient was seen in the ER 2 days prior to admission but left without being seen due to long wait times.  In the ED, hemoglobin 8.3 which was decreased by 1 g over the past 2 days, TSH elevated 21.26 and Hemoccult positive stools.  Patient was started on a Protonix drip.  Gastroenterology was consulted and hospital service consulted for further evaluation and management of anemia concern for GI bleed.   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  Hemoglobin dropped to 6.9 and transfused 2 unit PRBC on 12/30.  CEA elevated 470.  Patient underwent EGD, push enteroscopy and colonoscopy that was notable for partially obstructing ascending colon malignancy.  General surgery was consulted and patient underwent robotic assisted hemicolectomy on 10/01/2021 by Dr. Johney Maine. --Soft diet  Acute on chronic blood loss anemia History of GI AVMs and diverticular bleeding Patient underwent EGD/colonoscopy showing colon cancer likely cause of bleeding.   Received 2 unit PRBCs on 12/30. --Hgb 8.3>>6.9>10.1>7.6>7.7>7.4>7.3>7.4 --Holding home aspirin --Ferrous sulfate 325 mg p.o. daily --Protonix 40 mg p.o. twice daily --Monitor H&H daily  Essential hypertension At baseline on amlodipine 10 mg p.o. daily, labetalol 200 mg p.o. daily. --BP 125/70 this morning, well controlled off of antihypertensives --Continue monitor BP closely  CKD stage IIIb Baseline creatinine 1.5 to 2.0.  Thrombocytopenia Stable.  Hypernatremia: Resolved Hypokalemia: Resolved Sodium 146 on admission, potassium 3.3; repleted.  Now resolved with sodium 138 and potassium 3.8 on 10/04/2021.  Hypothyroidism TSH elevated 29 with free T4 normal at 0.7. --Levothyroxine 75 mcg p.o. daily --Repeat TFTs 3-4 weeks  GERD: Protonix 40 mg p.o. twice daily  Dementia without behavioral disturbance --Donepezil 10 mg p.o. daily --Namenda 10 mg p.o. twice daily  Leukocytosis: Resolved Likely postoperative/reactive without fever, now resolved.  Weakness/deconditioning/debility: --Pending SNF placement --Continue PT/OT efforts while inpatient    DVT prophylaxis: enoxaparin (LOVENOX) injection 30 mg Start: 10/02/21 1000 Place and maintain sequential compression device Start: 10/02/21 0651 SCDs Start: 09/26/21 2224   Code Status: Full Code Family Communication: No family present at bedside this morning  Disposition Plan:  Level of care: Med-Surg Status is: Inpatient  Remains inpatient appropriate because: Pending SNF placement, medically stable for discharge    Consultants:  [facility]; Dr. Essie Hart Surgery Cardiology for preoperative evaluation  Procedures:  EGD Colonoscopy Robotic proximal colectomy, Dr. Johney Maine 12/30  Antimicrobials:  Perioperative clindamycin, gentamicin   Subjective: Patient seen examined bedside, resting comfortably.  Continues with weakness, did ambulate some with PT yesterday.  Unlikely able to return home ultrasound test  due to significant stairs and currently looking into SNF placement.  Seen by general surgery this morning, patient wishing to advance diet further.  No other complaints or concerns at this time.  Denies headache, no visual changes, no chest pain, no palpitations, no shortness of breath, no abdominal pain, no fever/chills/night sweats, no nausea/vomiting/diarrhea, no abdominal pain, no paresthesias.  No acute events overnight per nursing staff.  Objective: Vitals:   10/06/21 0553 10/06/21 1355 10/06/21 2058 10/07/21 0506  BP: 122/80 119/77 117/80 125/70  Pulse: 67 89 66 (!) 58  Resp: 16 18 14 14   Temp: 97.9 F (36.6 C) 99.2 F (37.3 C) 97.7 F (36.5 C) 97.7 F (36.5 C)  TempSrc: Oral Oral Oral Oral  SpO2:  100% 100% 100%  Weight:    55.3 kg  Height:        Intake/Output Summary (Last 24 hours) at 10/07/2021 1113 Last data filed at 10/07/2021 1047 Gross per 24 hour  Intake 840 ml  Output 1001 ml  Net -161 ml   Filed Weights   10/02/21 0500 10/05/21 0500 10/07/21 0506  Weight: 56.5 kg 59 kg 55.3 kg    Examination:  General exam: Appears calm and comfortable, elderly in appearance Respiratory system: Clear to auscultation. Respiratory effort normal.  On room air Cardiovascular system: S1 & S2 heard, RRR. No JVD, murmurs, rubs, gallops or clicks. No pedal edema. Gastrointestinal system: Abdomen is nondistended, soft and nontender. No organomegaly or masses felt. Normal bowel sounds heard.  Surgical/trocar sites noted, clean/dry with no erythema or purulent drainage Central nervous system: Alert and oriented. No focal neurological deficits. Extremities: Symmetric 5 x 5 power. Skin: No rashes, lesions or ulcers Psychiatry: Judgement and insight appear normal. Mood & affect appropriate.     Data Reviewed: I have personally reviewed following labs and imaging studies  CBC: Recent Labs  Lab 10/01/21 0600 10/01/21 1932 10/02/21 0255 10/03/21 0457 10/04/21 0426 10/05/21 0356  10/06/21 0426 10/07/21 0432  WBC 6.2  --  14.3* 10.3 7.4 5.9  --   --   NEUTROABS 4.0  --  12.7*  --   --   --   --   --   HGB 6.9* 10.1* 7.4* 7.6* 7.7* 7.4* 7.3* 7.4*  HCT 20.6* 31.3* 22.4* 23.0* 23.5* 22.4*  --   --   MCV 72.3*  --  76.2* 76.2* 77.0* 76.7*  --   --   PLT 101*  --  86* 88* 102* 118*  --   --    Basic Metabolic Panel: Recent Labs  Lab 10/01/21 0600 10/02/21 0255 10/03/21 0457 10/04/21 0426  NA 135 136 139 138  K 3.3* 4.0 3.7 3.8  CL 106 107 109 110  CO2 24 20* 22 23  GLUCOSE 95 144* 79 74  BUN 19 16 24* 24*  CREATININE 1.53* 1.27* 1.25* 1.32*  CALCIUM 8.2* 7.6* 7.8* 8.0*  MG  --   --  1.4*  --    GFR: Estimated Creatinine Clearance: 26 mL/min (A) (by C-G formula based on SCr of 1.32 mg/dL (H)). Liver Function Tests: No results for input(s): AST, ALT, ALKPHOS, BILITOT, PROT, ALBUMIN in the last 168 hours. No results for input(s): LIPASE, AMYLASE in the last 168 hours. No results for input(s): AMMONIA in the last 168 hours. Coagulation Profile: No results for input(s): INR, PROTIME in the last 168 hours. Cardiac Enzymes: No results for input(s): CKTOTAL, CKMB, CKMBINDEX, TROPONINI in the last  168 hours. BNP (last 3 results) No results for input(s): PROBNP in the last 8760 hours. HbA1C: No results for input(s): HGBA1C in the last 72 hours. CBG: No results for input(s): GLUCAP in the last 168 hours. Lipid Profile: No results for input(s): CHOL, HDL, LDLCALC, TRIG, CHOLHDL, LDLDIRECT in the last 72 hours. Thyroid Function Tests: No results for input(s): TSH, T4TOTAL, FREET4, T3FREE, THYROIDAB in the last 72 hours. Anemia Panel: No results for input(s): VITAMINB12, FOLATE, FERRITIN, TIBC, IRON, RETICCTPCT in the last 72 hours. Sepsis Labs: No results for input(s): PROCALCITON, LATICACIDVEN in the last 168 hours.  Recent Results (from the past 240 hour(s))  MRSA Next Gen by PCR, Nasal     Status: None   Collection Time: 10/01/21  6:19 PM   Specimen:  Nasal Mucosa; Nasal Swab  Result Value Ref Range Status   MRSA by PCR Next Gen NOT DETECTED NOT DETECTED Final    Comment: (NOTE) The GeneXpert MRSA Assay (FDA approved for NASAL specimens only), is one component of a comprehensive MRSA colonization surveillance program. It is not intended to diagnose MRSA infection nor to guide or monitor treatment for MRSA infections. Test performance is not FDA approved in patients less than 64 years old. Performed at [facility], [place] [address] [place], [place] [address]          Radiology Studies: No results found.      Scheduled Meds:  acetaminophen  1,000 mg Oral Q8H   donepezil  10 mg Oral Daily   enoxaparin (LOVENOX) injection  30 mg Subcutaneous Q24H   feeding supplement  237 mL Oral BID BM   ferrous sulfate  325 mg Oral Q breakfast   gabapentin  200 mg Oral TID   levothyroxine  75 mcg Oral q morning   memantine  10 mg Oral BID   montelukast  10 mg Oral QHS   pantoprazole  40 mg Oral BID   polycarbophil  625 mg Oral BID   Continuous Infusions:  methocarbamol (ROBAXIN) IV Stopped (10/02/21 0130)     LOS: 10 days    Time spent: 37 minutes spent on chart review, discussion with nursing staff, consultants, updating family and interview/physical exam; more than 50% of that time was spent in counseling and/or coordination of care.    Saylor Sheckler J [place] DO [facility] Available via Epic secure chat 7am-7pm After these hours, please refer to coverage provider listed on [email or web address] 10/07/2021, 11:13 AM

## 2021-10-07 NOTE — Progress Notes (Signed)
6 Days Post-Op   Subjective/Chief Complaint: NAEO. Tolerating PO. Having bowel function. Asking to get out of bed.   Objective: Vital signs in last 24 hours: Temp:  [97.7 F (36.5 C)-99.2 F (37.3 C)] 97.7 F (36.5 C) (01/05 0506) Pulse Rate:  [58-89] 58 (01/05 0506) Resp:  [14-18] 14 (01/05 0506) BP: (117-125)/(70-80) 125/70 (01/05 0506) SpO2:  [100 %] 100 % (01/05 0506) Weight:  [55.3 kg] 55.3 kg (01/05 0506) Last BM Date: 10/06/21  Intake/Output from previous day: 01/04 0701 - 01/05 0700 In: 720 [P.O.:720] Out: 1001 [Urine:1000; Stool:1] Intake/Output this shift: No intake/output data recorded.  General: resting comfortably, NAD Neuro: alert and oriented, no focal deficits Resp: normal work of breathing Abdomen: soft, nondistended, appropriately tender around trochar sites. Incisions clean and dry with no erythema, induration or drainage. Extremities: warm and well-perfused   Lab Results:  Recent Labs    10/05/21 0356 10/06/21 0426 10/07/21 0432  WBC 5.9  --   --   HGB 7.4* 7.3* 7.4*  HCT 22.4*  --   --   PLT 118*  --   --    BMET No results for input(s): NA, K, CL, CO2, GLUCOSE, BUN, CREATININE, CALCIUM in the last 72 hours.  PT/INR No results for input(s): LABPROT, INR in the last 72 hours. ABG No results for input(s): PHART, HCO3 in the last 72 hours.  Invalid input(s): PCO2, PO2  Studies/Results: No results found.  Anti-infectives: Anti-infectives (From admission, onward)    Start     Dose/Rate Route Frequency Ordered Stop   10/01/21 0600  clindamycin (CLEOCIN) IVPB 900 mg       See Hyperspace for full Linked Orders Report.   900 mg 100 mL/hr over 30 Minutes Intravenous 60 min pre-op 09/30/21 1448 10/01/21 1040   10/01/21 0600  gentamicin (GARAMYCIN) 310 mg in dextrose 5 % 100 mL IVPB       See Hyperspace for full Linked Orders Report.   5 mg/kg  61 kg 107.8 mL/hr over 60 Minutes Intravenous 60 min pre-op 09/30/21 1448 10/01/21 1106    09/30/21 1447  neomycin (MYCIFRADIN) tablet 1,000 mg  Status:  Discontinued        1,000 mg Oral As directed 09/30/21 1448 09/30/21 1544       Assessment/Plan: s/p Procedure(s): XI ROBOT ASSISTED RIGHT HEMICOLECTOMY, WITH TAP BLOCK, ASSESSMENT OF TISSUE PERFUSION USING FIREFLY (Right) POD6 Continue soft diet Ambulate PT following, recommending HHPT Surgical pathology pending, will follow  VTE: lovenox Ok for discharge from surgical standpoint, awaiting SNF placement.   LOS: 10 days   Obie Dredge, [facility] General, Hepatobiliary and Pancreatic Surgery 10/07/21 10:08 AM

## 2021-10-07 NOTE — Progress Notes (Addendum)
Occupational Therapy Treatment Patient Details Name: Susan [name] MRN: [id number] DOB: 1941/07/18 Today's Date: 10/07/2021   History of present illness Susan [name], 81 y.o. female with PMH of hypertension, hypothyroidism, history of GI AV malformations with chronic anemia, MDS presented with anemia, melanotic stool.  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 Patient able to don underwear and shoes with supervision. Patient ambulated in hall with RW to work on activity tolerance and patient requesting to walk as her bottom was uncomfortable in seat. Min guard for safety and  verbal cues to look up and increase speed. Walked 100 feet with sock feet and reported pain and discomfort in feet. Walked 50 feet with shoes with improved gait speed and step length. Therapist witnessed patient ordering her breakfast - she is able to do so independently and then call back and alter order. Patient able to follow commands and answer questions appropriately and report recent events in hospital. Patient making good progress towards goals. Patient reports difficulty with discharge due to 3 flights of steps to get in to son's apartment. Continue to recommend frequent supervision at discharge. May require SNF at discharge.     Recommendations for follow up therapy are one component of a multi-disciplinary discharge planning process, led by the attending physician.  Recommendations may be updated based on patient status, additional functional criteria and insurance authorization.    Follow Up Recommendations  Home health OT  vs SNF   Assistance Recommended at Discharge Intermittent Supervision/Assistance  Patient can return home with the following  Assistance with cooking/housework;Direct supervision/assist for financial management;Direct  supervision/assist for medications management;A little help with walking and/or transfers   Equipment Recommendations  None recommended by OT    Recommendations for Other Services      Precautions / Restrictions Precautions Precautions: Fall Precaution Comments: abdominal incisions Restrictions Weight Bearing Restrictions: No       Mobility Bed Mobility                    Transfers                         Balance Overall balance assessment: Mild deficits observed, not formally tested                                         ADL either performed or assessed with clinical judgement   ADL Overall ADL's : Needs assistance/impaired                     Lower Body Dressing: Supervision/safety;Sit to/from stand Lower Body Dressing Details (indicate cue type and reason): able to ambulate to window seat with RW and assist for looking for clothing. Able to don shoes and underwear.             Functional mobility during ADLs: Min guard;Rolling walker (2 wheels) General ADL Comments: Min guard for safety and  verbal cues to look up and increase speed. Walked 100 feet with sock feet and reported pain and discomfort. Walked 50 feet with shoes with improved gait speed and step length.    Extremity/Trunk Assessment              Vision  Vision Assessment?: No apparent visual deficits   Perception     Praxis      Cognition Arousal/Alertness: Awake/alert Behavior During Therapy: WFL for tasks assessed/performed Overall Cognitive Status: History of cognitive impairments - at baseline                                 General Comments: hx of dementia, memory deficits. Able to amswer all questions appropriately. Able to oder her meals and modify meals (calling to cafesteria), and report recent hospital events.          Exercises     Shoulder Instructions       General Comments      Pertinent Vitals/ Pain        Pain Assessment: Faces Faces Pain Scale: Hurts little more Pain Location: Bil knee pain, left foot Pain Descriptors / Indicators: Sore;Aching Pain Intervention(s): Limited activity within patient's tolerance;Monitored during session  Home Living                                          Prior Functioning/Environment              Frequency  Min 2X/week        Progress Toward Goals  OT Goals(current goals can now be found in the care plan section)  Progress towards OT goals: Progressing toward goals  Acute Rehab OT Goals Patient Stated Goal: to go home OT Goal Formulation: With patient Time For Goal Achievement: 10/15/21 Potential to Achieve Goals: Good  Plan Discharge plan remains appropriate    Co-evaluation                 AM-PAC OT "6 Clicks" Daily Activity     Outcome Measure   Help from another person eating meals?: None Help from another person taking care of personal grooming?: A Little Help from another person toileting, which includes using toliet, bedpan, or urinal?: A Little Help from another person bathing (including washing, rinsing, drying)?: A Little Help from another person to put on and taking off regular upper body clothing?: A Little Help from another person to put on and taking off regular lower body clothing?: A Little 6 Click Score: 19    End of Session Equipment Utilized During Treatment: Rolling walker (2 wheels);Gait belt  OT Visit Diagnosis: Unsteadiness on feet (R26.81);Muscle weakness (generalized) (M62.81)   Activity Tolerance Patient tolerated treatment well   Patient Left in chair;with call bell/phone within reach   Nurse Communication Mobility status        Time: 2025-4270 OT Time Calculation (min): 16 min  Charges: OT General Charges $OT Visit: 1 Visit OT Treatments $Self Care/Home Management : 8-22 mins  Derl Barrow, OTR/L [place]  Office [phone] Pager: [place] 10/07/2021, 1:26 PM

## 2021-10-08 LAB — CBC
HCT: 22.7 % — ABNORMAL LOW (ref 36.0–46.0)
Hemoglobin: 7.3 g/dL — ABNORMAL LOW (ref 12.0–15.0)
MCH: 25.1 pg — ABNORMAL LOW (ref 26.0–34.0)
MCHC: 32.2 g/dL (ref 30.0–36.0)
MCV: 78 fL — ABNORMAL LOW (ref 80.0–100.0)
Platelets: 158 10*3/uL (ref 150–400)
RBC: 2.91 MIL/uL — ABNORMAL LOW (ref 3.87–5.11)
RDW: 23.6 % — ABNORMAL HIGH (ref 11.5–15.5)
WBC: 3.5 10*3/uL — ABNORMAL LOW (ref 4.0–10.5)
nRBC: 0 % (ref 0.0–0.2)

## 2021-10-08 LAB — BASIC METABOLIC PANEL
Anion gap: 8 (ref 5–15)
BUN: 24 mg/dL — ABNORMAL HIGH (ref 8–23)
CO2: 22 mmol/L (ref 22–32)
Calcium: 8.5 mg/dL — ABNORMAL LOW (ref 8.9–10.3)
Chloride: 110 mmol/L (ref 98–111)
Creatinine, Ser: 1.44 mg/dL — ABNORMAL HIGH (ref 0.44–1.00)
GFR, Estimated: 36 mL/min — ABNORMAL LOW (ref >60)
Glucose, Bld: 75 mg/dL (ref 70–99)
Potassium: 3.9 mmol/L (ref 3.5–5.1)
Sodium: 140 mmol/L (ref 135–145)

## 2021-10-08 MED ORDER — GABAPENTIN 100 MG PO CAPS: 200.0000 mg | ORAL_CAPSULE | Freq: Three times a day (TID) | ORAL | Status: AC

## 2021-10-08 MED ORDER — PANTOPRAZOLE SODIUM 40 MG PO TBEC: 40.0000 mg | DELAYED_RELEASE_TABLET | Freq: Two times a day (BID) | ORAL | Status: DC

## 2021-10-08 MED ORDER — FERROUS SULFATE 325 (65 FE) MG PO TABS: 325.0000 mg | ORAL_TABLET | Freq: Every day | ORAL | 3 refills | Status: AC

## 2021-10-08 MED ORDER — CALCIUM POLYCARBOPHIL 625 MG PO TABS: 625.0000 mg | ORAL_TABLET | Freq: Two times a day (BID) | ORAL | Status: AC

## 2021-10-08 NOTE — TOC Transition Note (Signed)
Transition of Care [facility]) - CM/SW Discharge Note   Patient Details  Name: Susan [name] MRN: [id number] Date of Birth: [birth year]  [facility] of [facility] CM/SW Contact:  Lennart Pall, LCSW Phone Number: 10/08/2021, 1:05 PM   Clinical Narrative:     Pt is medically cleared for dc to SNF today.  Pt/ son have accepted bed offer at [facility] and have received insurance authorization.  PTAR called at 12:45pm.  RN to call report to [phone].  No further TOC needs.  Final next level of care: Skilled Nursing Facility Barriers to Discharge: Barriers Resolved   Patient Goals and CMS Choice Patient states their goals for this hospitalization and ongoing recovery are:: eventually be able to return home      Discharge Placement PASRR number recieved: 10/07/21            Patient chooses bed at: [facility] [place] Patient to be transferred to facility by: [facility] Name of family member notified: son, Selinda Flavin Patient and family notified of of transfer: 10/08/21  Discharge Plan and Services In-house Referral: Clinical Social Work              DME Arranged: N/A DME Agency: NA                  Social Determinants of Health ([place]) Interventions     Readmission Risk Interventions Readmission Risk Prevention Plan 09/29/2021  Transportation Screening Complete  PCP or Specialist Appt within 5-7 Days Complete  Home Care Screening Complete  Medication Review (RN CM) Complete  Some recent data might be hidden

## 2021-10-08 NOTE — Progress Notes (Signed)
Physical Therapy Treatment Patient Details Name: Susan [name] MRN: [id number] DOB: 08/29/1941 Today's Date: 10/08/2021   History of Present Illness Susan [name], 81 y.o. female with PMH of hypertension, hypothyroidism, history of GI AV malformations with chronic anemia, MDS presented with anemia, melanotic stool.  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 Ambulation attempted with no device and pt required min assist with HHA for gait due to staggering LOB to Lt on multiple occasions. Gait continued with RW and pt much steadier with min guard only for safety. Encouraged to continue mobilizing with assistance from RN staff to maintain activity tolerance. Pt does not have assist at home and due to ongoing balance and strength impairments will benefit from ST rehab at SNF to reduce fall risk prior to return home. Acute PT will progress as able.    Recommendations for follow up therapy are one component of a multi-disciplinary discharge planning process, led by the attending physician.  Recommendations may be updated based on patient status, additional functional criteria and insurance authorization.  Follow Up Recommendations   ST rehab at [facility]     Assistance Recommended at Discharge Frequent or constant Supervision/Assistance  Patient can return home with the following     Equipment Recommendations  Rolling walker (2 wheels)    Recommendations for Other Services       Precautions / Restrictions Precautions Precautions: Fall Precaution Comments: abdominal incisions Restrictions Weight Bearing Restrictions: No     Mobility  Bed Mobility Overal bed mobility: Needs Assistance Bed Mobility: Rolling;Sidelying to Sit Rolling: Min guard Sidelying to sit: Min guard       General bed mobility comments: cues for sequencing, and light tactile  cue/assist to reach for bed rail to raise trunk upright.    Transfers Overall transfer level: Needs assistance Equipment used: Rolling walker (2 wheels) Transfers: Sit to/from Stand Sit to Stand: Min guard          guarding for safety, pt with safe hand placement and no cues needed.      Ambulation/Gait Ambulation/Gait assistance: Min guard;Min assist Gait Distance (Feet): 180 Feet Assistive device: Rolling walker (2 wheels);1 person hand held assist Gait Pattern/deviations: Step-through pattern;Decreased stride length;Staggering left Gait velocity: decr     General Gait Details: initially ambulated with HHA and pt drifting Rt/Lt with multiple LOB to Lt. Pt then ambulated with RW and balance much improved with min guard.   Stairs             Wheelchair Mobility    Modified Rankin (Stroke Patients Only)       Balance Overall balance assessment: Mild deficits observed, not formally tested                                          Cognition Arousal/Alertness: Awake/alert Behavior During Therapy: WFL for tasks assessed/performed Overall Cognitive Status: History of cognitive impairments - at baseline                                 General Comments: hx of dementia, memory deficits. Able to amswer all questions appropriately. Able to oder her meals and modify meals (calling to cafesteria), and report recent  hospital events.        Exercises      General Comments        Pertinent Vitals/Pain Pain Assessment: 0-10 Pain Score: 2  Pain Location: Bil knee pain, left knee more Pain Descriptors / Indicators: Sore;Aching Pain Intervention(s): Limited activity within patient's tolerance;Monitored during session;Repositioned    Home Living                          Prior Function            PT Goals (current goals can now be found in the care plan section) Acute Rehab PT Goals Patient Stated Goal: go to son's  house PT Goal Formulation: With patient Time For Goal Achievement: 10/16/21 Potential to Achieve Goals: Good Progress towards PT goals: Progressing toward goals    Frequency    Min 3X/week      PT Plan Current plan remains appropriate    Co-evaluation              AM-PAC PT "6 Clicks" Mobility   Outcome Measure  Help needed turning from your back to your side while in a flat bed without using bedrails?: A Little Help needed moving from lying on your back to sitting on the side of a flat bed without using bedrails?: A Little Help needed moving to and from a bed to a chair (including a wheelchair)?: A Little Help needed standing up from a chair using your arms (e.g., wheelchair or bedside chair)?: A Little Help needed to walk in hospital room?: A Little Help needed climbing 3-5 steps with a railing? : A Little 6 Click Score: 18    End of Session Equipment Utilized During Treatment: Gait belt Activity Tolerance: Patient tolerated treatment well Patient left: in chair;with call bell/phone within reach;with chair alarm set Nurse Communication: Mobility status PT Visit Diagnosis: Unsteadiness on feet (R26.81);Pain     Time: 0920-0939 PT Time Calculation (min) (ACUTE ONLY): 19 min  Charges:  $Gait Training: 8-22 mins                     [name], DPT Acute Rehabilitation Services Office [phone] Pager [phone]    [name] 10/08/2021, 12:47 PM

## 2021-10-08 NOTE — Progress Notes (Signed)
Discharging to SNF today. Patient mobilizing in her room during my evaluation. Denies pain. Tolerated breakfast and having bowel function. We will call her with her pathology results. Follow up arranged.   Obie Dredge, PA-C [place] [place] [facility] Please see Amion for pager number during day hours 7:00am-4:30pm

## 2021-10-08 NOTE — Plan of Care (Signed)
°  Problem: Health Behavior/Discharge Planning: Goal: Ability to manage health-related needs will improve Outcome: Adequate for Discharge   Problem: Clinical Measurements: Goal: Ability to maintain clinical measurements within normal limits will improve Outcome: Adequate for Discharge Goal: Will remain free from infection Outcome: Adequate for Discharge Goal: Diagnostic test results will improve Outcome: Adequate for Discharge Goal: Respiratory complications will improve Outcome: Adequate for Discharge Goal: Cardiovascular complication will be avoided Outcome: Adequate for Discharge   Problem: Activity: Goal: Risk for activity intolerance will decrease Outcome: Adequate for Discharge   Problem: Nutrition: Goal: Adequate nutrition will be maintained Outcome: Adequate for Discharge   Problem: Pain Managment: Goal: General experience of comfort will improve Outcome: Adequate for Discharge   Problem: Safety: Goal: Ability to remain free from injury will improve Outcome: Adequate for Discharge   Problem: Skin Integrity: Goal: Risk for impaired skin integrity will decrease Outcome: Adequate for Discharge   Problem: Fluid Volume: Goal: Will show no signs and symptoms of excessive bleeding Outcome: Adequate for Discharge

## 2021-10-08 NOTE — Progress Notes (Signed)
Report called to Rosalie at [facility]. All questions were answered and patient is being transferred via [place].

## 2021-10-08 NOTE — Plan of Care (Signed)
°  Problem: Health Behavior/Discharge Planning: Goal: Ability to manage health-related needs will improve Outcome: Progressing   Problem: Clinical Measurements: Goal: Ability to maintain clinical measurements within normal limits will improve Outcome: Progressing Goal: Will remain free from infection Outcome: Progressing Goal: Diagnostic test results will improve Outcome: Progressing Goal: Respiratory complications will improve Outcome: Progressing Goal: Cardiovascular complication will be avoided Outcome: Progressing   Problem: Activity: Goal: Risk for activity intolerance will decrease Outcome: Progressing   Problem: Nutrition: Goal: Adequate nutrition will be maintained Outcome: Progressing   Problem: Pain Managment: Goal: General experience of comfort will improve Outcome: Progressing   Problem: Safety: Goal: Ability to remain free from injury will improve Outcome: Progressing   Problem: Skin Integrity: Goal: Risk for impaired skin integrity will decrease Outcome: Progressing   Problem: Fluid Volume: Goal: Will show no signs and symptoms of excessive bleeding Outcome: Progressing

## 2021-10-08 NOTE — Discharge Summary (Signed)
Physician Discharge Summary  Susan [name] AJO:[id number] DOB: 03-24-1941 DOA: 09/26/2021  PCP: Sonia Side., [name]  Admit date: 09/26/2021 Discharge date: 10/08/2021  Admitted From: Home Disposition: [facility]    Recommendations for Outpatient Follow-up:  Follow up with PCP in 1-2 weeks Follow-up with general surgery, Dr. Johney Maine as scheduled on 10/25/2021 Discontinued labetalol and amlodipine for borderline hypotension; needs continued monitoring of blood pressure Please obtain BMP/CBC in one week to assess hemoglobin and renal function Recommend repeat TFTs in 3-4 weeks and adjustment of thyroid medication if needed  Discharge Condition: Stable CODE STATUS: Full code Diet recommendation: Soft diet  History of present illness:  Susan [name] Is a 81 year old female with past medical history significant for essential hypertension, hypothyroidism, history of GI AVMs with chronic anemia, MDS who presented to [facility] ED on 12/25 with weakness, dark melanotic stools.  Patient was found by son in which she was confused.  Patient was seen in the ER 2 days prior to admission but left without being seen due to long wait times.   In the ED, hemoglobin 8.3 which was decreased by 1 g over the past 2 days, TSH elevated 21.26 and Hemoccult positive stools.  Patient was started on a Protonix drip.  Gastroenterology was consulted and hospital service consulted for further evaluation and management of anemia concern for GI bleed.    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  Hemoglobin dropped to 6.9 and transfused 2 unit PRBC on 12/30.  CEA elevated 470.  Patient underwent EGD, push enteroscopy and colonoscopy that was notable for partially obstructing ascending colon malignancy. General surgery was consulted and patient underwent robotic assisted hemicolectomy on  10/01/2021 by Dr. Johney Maine.  Continue soft diet.  Outpatient follow-up with general surgery scheduled on 10/25/2021.   Acute on chronic blood loss anemia History of GI AVMs and diverticular bleeding Patient underwent EGD/colonoscopy showing colon cancer likely cause of bleeding.  Received 2 unit PRBCs on 12/30.  Hemoglobin 7.3, stable at time of discharge.  Continue ferrous sulfate 3 and 25 mg p.o. daily.  Protonix 40 mg p.o. twice daily.  Continue to hold home aspirin.  Recommend repeat CBC 1 week to assess hemoglobin level.   Essential hypertension At baseline on amlodipine 10 mg p.o. daily, labetalol 200 mg p.o. daily.  Home antihypertensives discontinued as patient's blood pressure has been well controlled during hospitalization off of antihypertensive therapy.  Continue monitor BP closely outpatient.   CKD stage IIIb Baseline creatinine 1.5 to 2.0.  Creatinine 1.44 at time of discharge.   Thrombocytopenia Stable.   Hypernatremia: Resolved Hypokalemia: Resolved Sodium 146 on admission, potassium 3.3; repleted.  Now resolved with sodium 138 and potassium 3.8 on 10/04/2021.   Hypothyroidism TSH elevated 29 with free T4 normal at 0.7.  Continue levothyroxine 75 mcg p.o. daily.  Recommend repeat TFTs 3-4 weeks   GERD: Protonix 40 mg p.o. twice daily   Dementia without behavioral disturbance Donepezil 10 mg p.o. daily, Namenda 10 mg p.o. twice daily   Leukocytosis: Resolved Likely postoperative/reactive without fever, now resolved.   Weakness/deconditioning/debility: Discharging to SNF for further rehabilitation.  Discharge Diagnoses:  Principal Problem:   Cancer of ascending colon ([place]) Active Problems:   Essential hypertension   Hypothyroidism   GERD (gastroesophageal reflux disease)   CKD, 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)   Dementia without behavioral disturbance ([place])  Acute on chronic blood loss anemia   Melena   Hx of arteriovenous malformation (AVM)   IDA (iron deficiency  anemia)   Inguinal hernia, left    Discharge Instructions  Discharge Instructions     Call MD for:  difficulty breathing, headache or visual disturbances   Complete by: As directed    Call MD for:  extreme fatigue   Complete by: As directed    Call MD for:  persistant dizziness or light-headedness   Complete by: As directed    Call MD for:  persistant nausea and vomiting   Complete by: As directed    Call MD for:  severe uncontrolled pain   Complete by: As directed    Call MD for:  temperature >100.4   Complete by: As directed    Diet - low sodium heart healthy   Complete by: As directed    Increase activity slowly   Complete by: As directed    No wound care   Complete by: As directed       Allergies as of 10/08/2021       Reactions   Codeine Anaphylaxis   Penicillins Anaphylaxis   Bactrim Itching, Swelling   Clarithromycin Other (See Comments)   "Caused skin to peel off my hand."   Flagyl [metronidazole Hcl] Itching, Swelling   Food Other (See Comments)   EATS WHITE RICE ONLY (ALLERGIC REACTION TO YELLOW OR BROWN RICE THAT RESULTED IN HOSPITALIZATION)   Pineapple Itching, Swelling   SWELLING OF EYES   Sulfa Antibiotics Itching, Swelling        Medication List     STOP taking these medications    amLODipine 10 MG tablet Commonly known as: NORVASC   aspirin 81 MG chewable tablet   benzonatate 100 MG capsule Commonly known as: TESSALON   labetalol 200 MG tablet Commonly known as: NORMODYNE   predniSONE 5 MG tablet Commonly known as: DELTASONE   traMADol 50 MG tablet Commonly known as: ULTRAM       TAKE these medications    acetaminophen 500 MG tablet Commonly known as: TYLENOL Take 500 mg by mouth every 6 (six) hours as needed for moderate pain (for pain.).   allopurinol 300 MG tablet Commonly known as: ZYLOPRIM Take 300 mg by mouth daily.   donepezil 10 MG tablet Commonly known as: ARICEPT Take 10 mg by mouth daily.   famotidine 20  MG tablet Commonly known as: PEPCID Take 20 mg by mouth 2 (two) times daily.   ferrous sulfate 325 (65 FE) MG tablet Take 1 tablet (325 mg total) by mouth daily with breakfast. Start taking on: October 09, 2021 What changed:  medication strength when to take this   gabapentin 100 MG capsule Commonly known as: NEURONTIN Take 2 capsules (200 mg total) by mouth 3 (three) times daily.   levothyroxine 75 MCG tablet Commonly known as: SYNTHROID Take 75 mcg by mouth every morning.   memantine 10 MG tablet Commonly known as: Namenda Take 1 tablet (10 mg total) by mouth 2 (two) times daily.   mirtazapine 15 MG tablet Commonly known as: REMERON Take 15 mg by mouth at bedtime.   montelukast 10 MG tablet Commonly known as: SINGULAIR Take 10 mg by mouth at bedtime.   pantoprazole 40 MG tablet Commonly known as: PROTONIX Take 1 tablet (40 mg total) by mouth 2 (two) times daily. What changed: when to take this   polycarbophil 625 MG tablet Commonly known as: FIBERCON Take 1 tablet (  625 mg total) by mouth 2 (two) times daily.        Contact information for follow-up providers     Michael Boston, MD. Go on 10/25/2021.   Specialties: General Surgery, Colon and Rectal Surgery Why: 3:45 PM. Please arrive 30 min prior to appointment time. Bring photo ID and insurance information. Contact information: [address] [place] [address] [phone]         Sonia Side., [name]. Schedule an appointment as soon as possible for a visit in 1 week(s).   Specialty: Family Medicine Contact information: [place] [address] [phone]              Contact information for after-discharge care     Destination     [facility] .   Service: Skilled Nursing Contact information: [place] [address] [phone]                    Allergies  Allergen Reactions    Codeine Anaphylaxis   Penicillins Anaphylaxis   Bactrim Itching and Swelling   Clarithromycin Other (See Comments)    "Caused skin to peel off my hand."   Flagyl [Metronidazole Hcl] Itching and Swelling   Food Other (See Comments)    EATS WHITE RICE ONLY (ALLERGIC REACTION TO YELLOW OR BROWN RICE THAT RESULTED IN HOSPITALIZATION)   Pineapple Itching and Swelling    SWELLING OF EYES   Sulfa Antibiotics Itching and Swelling    Consultations: [facility]; Dr. Silverio Decamp General Surgery Cardiology for preoperative evaluation   Procedures/Studies: No results found.   Subjective: Patient seen examined bedside, resting comfortably.  Sitting in bedside chair.  Eating breakfast.  Discharging to SNF today.  No other questions or concerns at this time.  Denies headache, no fever/chills/night sweats, no nausea/vomiting/diarrhea, no chest pain, no palpitations, no shortness of breath, no abdominal pain, no weakness, no fatigue, no paresthesias.  No acute events overnight reported by nursing staff.  Discharge Exam: Vitals:   10/07/21 2127 10/08/21 0450  BP: 130/82 (!) 155/70  Pulse: 72 64  Resp: 16 16  Temp: 97.9 F (36.6 C) 98.2 F (36.8 C)  SpO2: 100% 100%   Vitals:   10/07/21 0506 10/07/21 1331 10/07/21 2127 10/08/21 0450  BP: 125/70 123/80 130/82 (!) 155/70  Pulse: (!) 58 80 72 64  Resp: 14 18 16 16   Temp: 97.7 F (36.5 C)  97.9 F (36.6 C) 98.2 F (36.8 C)  TempSrc: Oral  Oral Oral  SpO2: 100% 98% 100% 100%  Weight: 55.3 kg   54.1 kg  Height:        General: Pt is alert, awake, not in acute distress Cardiovascular: RRR, S1/S2 +, no rubs, no gallops Respiratory: CTA bilaterally, no wheezing, no rhonchi, on room air Abdominal: Soft, NT, ND, bowel sounds + Extremities: no edema, no cyanosis    The results of significant diagnostics from this hospitalization (including imaging, microbiology, ancillary and laboratory) are listed below for reference.      Microbiology: Recent Results (from the past 240 hour(s))  MRSA Next Gen by PCR, Nasal     Status: None   Collection Time: 10/01/21  6:19 PM   Specimen: Nasal Mucosa; Nasal Swab  Result Value Ref Range Status   MRSA by PCR Next Gen NOT DETECTED NOT DETECTED Final    Comment: (NOTE) The GeneXpert MRSA Assay (FDA approved for NASAL specimens only), is one  component of a comprehensive MRSA colonization surveillance program. It is not intended to diagnose MRSA infection nor to guide or monitor treatment for MRSA infections. Test performance is not FDA approved in patients less than 20 years old. Performed at [facility], [place] [address], [place], [place] [address]   Resp Panel by RT-PCR (Flu A&B, Covid) Nasopharyngeal Swab     Status: None   Collection Time: 10/07/21  5:19 PM   Specimen: Nasopharyngeal Swab; Nasopharyngeal(NP) swabs in vial transport medium  Result Value Ref Range Status   SARS Coronavirus 2 by RT PCR NEGATIVE NEGATIVE Final    Comment: (NOTE) SARS-CoV-2 target nucleic acids are NOT DETECTED.  The SARS-CoV-2 RNA is generally detectable in upper respiratory specimens during the acute phase of infection. The lowest concentration of SARS-CoV-2 viral copies this assay can detect is 138 copies/mL. A negative result does not preclude SARS-Cov-2 infection and should not be used as the sole basis for treatment or other patient management decisions. A negative result may occur with  improper specimen collection/handling, submission of specimen other than nasopharyngeal swab, presence of viral mutation(s) within the areas targeted by this assay, and inadequate number of viral copies(<138 copies/mL). A negative result must be combined with clinical observations, patient history, and epidemiological information. The expected result is Negative.  Fact Sheet for Patients:  EntrepreneurPulse.com.au  Fact Sheet for Healthcare Providers:   IncredibleEmployment.be  This test is no t yet approved or cleared by the [place] FDA and  has been authorized for detection and/or diagnosis of SARS-CoV-2 by FDA under an Emergency Use Authorization (EUA). This EUA will remain  in effect (meaning this test can be used) for the duration of the COVID-19 declaration under Section 564(b)(1) of the Act, 21 U.S.C.section 360bbb-3(b)(1), unless the authorization is terminated  or revoked sooner.       Influenza A by PCR NEGATIVE NEGATIVE Final   Influenza B by PCR NEGATIVE NEGATIVE Final    Comment: (NOTE) The Xpert Xpress SARS-CoV-2/FLU/RSV plus assay is intended as an aid in the diagnosis of influenza from Nasopharyngeal swab specimens and should not be used as a sole basis for treatment. Nasal washings and aspirates are unacceptable for Xpert Xpress SARS-CoV-2/FLU/RSV testing.  Fact Sheet for Patients: EntrepreneurPulse.com.au  Fact Sheet for Healthcare Providers: IncredibleEmployment.be  This test is not yet approved or cleared by the [place] FDA and has been authorized for detection and/or diagnosis of SARS-CoV-2 by FDA under an Emergency Use Authorization (EUA). This EUA will remain in effect (meaning this test can be used) for the duration of the COVID-19 declaration under Section 564(b)(1) of the Act, 21 U.S.C. section 360bbb-3(b)(1), unless the authorization is terminated or revoked.  Performed at [facility], [place] [address] [place], [place] [address]      Labs: BNP (last 3 results) No results for input(s): BNP in the last 8760 hours. Basic Metabolic Panel: Recent Labs  Lab 10/02/21 0255 10/03/21 0457 10/04/21 0426 10/08/21 0502  NA 136 139 138 140  K 4.0 3.7 3.8 3.9  CL 107 109 110 110  CO2 20* 22 23 22   GLUCOSE 144* 79 74 75  BUN 16 24* 24* 24*  CREATININE 1.27* 1.25* 1.32* 1.44*  CALCIUM 7.6* 7.8* 8.0* 8.5*  MG  --   1.4*  --   --    Liver Function Tests: No results for input(s): AST, ALT, ALKPHOS, BILITOT, PROT, ALBUMIN in the last 168 hours. No results for input(s): LIPASE, AMYLASE in  the last 168 hours. No results for input(s): AMMONIA in the last 168 hours. CBC: Recent Labs  Lab 10/02/21 0255 10/03/21 0457 10/04/21 0426 10/05/21 0356 10/06/21 0426 10/07/21 0432 10/08/21 0502  WBC 14.3* 10.3 7.4 5.9  --   --  3.5*  NEUTROABS 12.7*  --   --   --   --   --   --   HGB 7.4* 7.6* 7.7* 7.4* 7.3* 7.4* 7.3*  HCT 22.4* 23.0* 23.5* 22.4*  --   --  22.7*  MCV 76.2* 76.2* 77.0* 76.7*  --   --  78.0*  PLT 86* 88* 102* 118*  --   --  158   Cardiac Enzymes: No results for input(s): CKTOTAL, CKMB, CKMBINDEX, TROPONINI in the last 168 hours. BNP: Invalid input(s): POCBNP CBG: No results for input(s): GLUCAP in the last 168 hours. D-Dimer No results for input(s): DDIMER in the last 72 hours. Hgb A1c No results for input(s): HGBA1C in the last 72 hours. Lipid Profile No results for input(s): CHOL, HDL, LDLCALC, TRIG, CHOLHDL, LDLDIRECT in the last 72 hours. Thyroid function studies No results for input(s): TSH, T4TOTAL, T3FREE, THYROIDAB in the last 72 hours.  Invalid input(s): FREET3 Anemia work up No results for input(s): VITAMINB12, FOLATE, FERRITIN, TIBC, IRON, RETICCTPCT in the last 72 hours. Urinalysis    Component Value Date/Time   COLORURINE YELLOW 09/26/2021 [place] 09/26/2021 1748   LABSPEC 1.020 09/26/2021 1748   PHURINE 6.0 09/26/2021 1748   GLUCOSEU NEGATIVE 09/26/2021 1748   HGBUR LARGE (A) 09/26/2021 [place] 09/26/2021 1748   [place] 09/26/2021 1748   PROTEINUR TRACE (A) 09/26/2021 1748   UROBILINOGEN 1.0 07/07/2015 1917   NITRITE NEGATIVE 09/26/2021 1748   LEUKOCYTESUR MODERATE (A) 09/26/2021 1748   Sepsis Labs Invalid input(s): PROCALCITONIN,  WBC,  LACTICIDVEN Microbiology Recent Results (from the past 240 hour(s))   MRSA Next Gen by PCR, Nasal     Status: None   Collection Time: 10/01/21  6:19 PM   Specimen: Nasal Mucosa; Nasal Swab  Result Value Ref Range Status   MRSA by PCR Next Gen NOT DETECTED NOT DETECTED Final    Comment: (NOTE) The GeneXpert MRSA Assay (FDA approved for NASAL specimens only), is one component of a comprehensive MRSA colonization surveillance program. It is not intended to diagnose MRSA infection nor to guide or monitor treatment for MRSA infections. Test performance is not FDA approved in patients less than 79 years old. Performed at [facility], [place] [address], [place], [place] [address]   Resp Panel by RT-PCR (Flu A&B, Covid) Nasopharyngeal Swab     Status: None   Collection Time: 10/07/21  5:19 PM   Specimen: Nasopharyngeal Swab; Nasopharyngeal(NP) swabs in vial transport medium  Result Value Ref Range Status   SARS Coronavirus 2 by RT PCR NEGATIVE NEGATIVE Final    Comment: (NOTE) SARS-CoV-2 target nucleic acids are NOT DETECTED.  The SARS-CoV-2 RNA is generally detectable in upper respiratory specimens during the acute phase of infection. The lowest concentration of SARS-CoV-2 viral copies this assay can detect is 138 copies/mL. A negative result does not preclude SARS-Cov-2 infection and should not be used as the sole basis for treatment or other patient management decisions. A negative result may occur with  improper specimen collection/handling, submission of specimen other than nasopharyngeal swab, presence of viral mutation(s) within the areas targeted by this assay, and inadequate number of viral copies(<138 copies/mL). A negative result must  be combined with clinical observations, patient history, and epidemiological information. The expected result is Negative.  Fact Sheet for Patients:  EntrepreneurPulse.com.au  Fact Sheet for Healthcare Providers:  IncredibleEmployment.be  This test is no t  yet approved or cleared by the [place] FDA and  has been authorized for detection and/or diagnosis of SARS-CoV-2 by FDA under an Emergency Use Authorization (EUA). This EUA will remain  in effect (meaning this test can be used) for the duration of the COVID-19 declaration under Section 564(b)(1) of the Act, 21 U.S.C.section 360bbb-3(b)(1), unless the authorization is terminated  or revoked sooner.       Influenza A by PCR NEGATIVE NEGATIVE Final   Influenza B by PCR NEGATIVE NEGATIVE Final    Comment: (NOTE) The Xpert Xpress SARS-CoV-2/FLU/RSV plus assay is intended as an aid in the diagnosis of influenza from Nasopharyngeal swab specimens and should not be used as a sole basis for treatment. Nasal washings and aspirates are unacceptable for Xpert Xpress SARS-CoV-2/FLU/RSV testing.  Fact Sheet for Patients: EntrepreneurPulse.com.au  Fact Sheet for Healthcare Providers: IncredibleEmployment.be  This test is not yet approved or cleared by the [place] FDA and has been authorized for detection and/or diagnosis of SARS-CoV-2 by FDA under an Emergency Use Authorization (EUA). This EUA will remain in effect (meaning this test can be used) for the duration of the COVID-19 declaration under Section 564(b)(1) of the Act, 21 U.S.C. section 360bbb-3(b)(1), unless the authorization is terminated or revoked.  Performed at [facility], [place] [address], [place],  [address]      Time coordinating discharge: Over 30 minutes  SIGNED:   Shacarra Choe J [place] DO  [facility] 10/08/2021, 10:07 AM

## 2021-10-12 ENCOUNTER — Telehealth: Payer: Self-pay | Admitting: Oncology

## 2021-10-12 NOTE — Telephone Encounter (Signed)
Scheduled appt per 1/10 referral. Pt is aware of appt date and time. I did offer pt appt on 1/11, but pt was unable to make that appt. I scheduled her for the next available.

## 2021-10-13 ENCOUNTER — Ambulatory Visit: Payer: Medicare Other | Admitting: Podiatry

## 2021-10-14 LAB — SURGICAL PATHOLOGY

## 2021-11-05 ENCOUNTER — Other Ambulatory Visit: Payer: Self-pay

## 2021-11-05 ENCOUNTER — Inpatient Hospital Stay: Payer: Medicare Other | Attending: Oncology | Admitting: Oncology

## 2021-11-05 VITALS — BP 144/94 | HR 83 | Temp 97.5°F | Resp 18 | Ht 62.0 in | Wt 113.4 lb

## 2021-11-05 DIAGNOSIS — K921 Melena: Secondary | ICD-10-CM | POA: Diagnosis not present

## 2021-11-05 DIAGNOSIS — Z9049 Acquired absence of other specified parts of digestive tract: Secondary | ICD-10-CM | POA: Insufficient documentation

## 2021-11-05 DIAGNOSIS — D631 Anemia in chronic kidney disease: Secondary | ICD-10-CM | POA: Diagnosis present

## 2021-11-05 DIAGNOSIS — N189 Chronic kidney disease, unspecified: Secondary | ICD-10-CM | POA: Diagnosis not present

## 2021-11-05 DIAGNOSIS — C186 Malignant neoplasm of descending colon: Secondary | ICD-10-CM | POA: Insufficient documentation

## 2021-11-05 DIAGNOSIS — D509 Iron deficiency anemia, unspecified: Secondary | ICD-10-CM | POA: Diagnosis not present

## 2021-11-05 DIAGNOSIS — E611 Iron deficiency: Secondary | ICD-10-CM | POA: Insufficient documentation

## 2021-11-05 DIAGNOSIS — N1831 Chronic kidney disease, stage 3a: Secondary | ICD-10-CM | POA: Diagnosis present

## 2021-11-05 DIAGNOSIS — D469 Myelodysplastic syndrome, unspecified: Secondary | ICD-10-CM | POA: Insufficient documentation

## 2021-11-05 DIAGNOSIS — Z79899 Other long term (current) drug therapy: Secondary | ICD-10-CM | POA: Insufficient documentation

## 2021-11-05 NOTE — Progress Notes (Signed)
Hematology and Oncology Follow Up Visit ° °Susan [name] °[id number] °06/13/1941 80 y.o. 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 °Colon cancer diagnosed in December 2022.  She was found to have T3N0. ° °Prior therapy: She is status post robotic proximal colectomy and lymph node dissection completed on October 01, 2021 by Dr. Gross.  The final pathology showed T3N0 moderately differentiated tumor with 0 out of 50 lymph nodes involved.  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 ° °Current therapy:  Aranesp 500 mcg every 4 weeks as well as iron replacement as needed. ° °Interim History:  Susan [name] returns today for repeat evaluation.  Since her last visit, she was hospitalized in December 2022 after presenting with with worsening anemia and melena.  The CT scan at that time showed a descending colon mass with heme positive stool.  Colonoscopy confirmed the presence of tumor and underwent surgical resection on December 30. ° °Since her discharge, she is currently receiving rehab at a skilled nursing facility and is slowly recovering.  She denies any hematochezia, melena or hemoptysis she is not eating as well and is slowly recovering. ° ° ° ° °Medications: Updated on review. °Current Outpatient Medications  °Medication Sig Dispense Refill  ° acetaminophen (TYLENOL) 500 MG tablet Take 500 mg by mouth every 6 (six) hours as needed for moderate pain (for pain.).    ° allopurinol (ZYLOPRIM) 300 MG tablet Take 300 mg by mouth daily.    ° donepezil (ARICEPT) 10 MG tablet Take 10 mg by mouth daily.    ° famotidine (PEPCID) 20 MG tablet Take 20 mg by mouth 2 (two) times daily.    ° ferrous sulfate 325 (65 FE) MG tablet Take 1 tablet (325 mg total) by mouth daily with breakfast.  3  ° gabapentin (NEURONTIN)  100 MG capsule Take 2 capsules (200 mg total) by mouth 3 (three) times daily.    ° levothyroxine (SYNTHROID) 75 MCG tablet Take 75 mcg by mouth every morning.    ° memantine (NAMENDA) 10 MG tablet Take 1 tablet (10 mg total) by mouth 2 (two) times daily. 60 tablet 11  ° mirtazapine (REMERON) 15 MG tablet Take 15 mg by mouth at bedtime.    ° montelukast (SINGULAIR) 10 MG tablet Take 10 mg by mouth at bedtime.    ° pantoprazole (PROTONIX) 40 MG tablet Take 1 tablet (40 mg total) by mouth 2 (two) times daily.    ° polycarbophil (FIBERCON) 625 MG tablet Take 1 tablet (625 mg total) by mouth 2 (two) times daily.    ° °No current facility-administered medications for this visit.  ° ° ° ° ° °Physical Exam: ° ° °Blood pressure (!) 144/94, pulse 83, temperature (!) 97.5 °F (36.4 °C), temperature source Tympanic, resp. rate 18, height 5' 2" (1.575 m), weight 113 lb 6.4 oz (51.4 kg), SpO2 100 %. ° ° ° ° ° ° °ECOG: 1 ° ° ° °General appearance: Alert, awake without any distress. °Head: Atraumatic without abnormalities °Oropharynx: Without any thrush or ulcers. °Eyes: No scleral icterus. °Lymph nodes: No lymphadenopathy noted in the cervical, supraclavicular, or axillary nodes °Heart:regular rate and rhythm, without any murmurs or gallops.   °Lung: Clear to auscultation without any rhonchi, wheezes or dullness to percussion. °Abdomin: Soft, nontender without any shifting dullness or ascites. °  Musculoskeletal: No clubbing or cyanosis. Neurological: No motor or sensory deficits. Skin: No rashes or lesions.          Lab Results: Lab Results  Component Value Date   WBC 3.5 (L) 10/08/2021   HGB 7.3 (L) 10/08/2021   HCT 22.7 (L) 10/08/2021   MCV 78.0 (L) 10/08/2021   PLT 158 10/08/2021     Chemistry      Component Value Date/Time   NA 140 10/08/2021 0502   NA 143 06/30/2017 0846   K 3.9 10/08/2021 0502   K 4.4 06/30/2017 0846   CL 110 10/08/2021 0502   CO2 22 10/08/2021 0502   CO2 24 06/30/2017 0846   BUN  24 (H) 10/08/2021 0502   BUN 26.5 (H) 06/30/2017 0846   CREATININE 1.44 (H) 10/08/2021 0502   CREATININE 1.74 (H) 09/17/2021 1228   CREATININE 1.31 (H) 07/02/2018 0950   CREATININE 1.4 (H) 06/30/2017 0846      Component Value Date/Time   CALCIUM 8.5 (L) 10/08/2021 0502   CALCIUM 10.1 06/30/2017 0846   ALKPHOS 78 09/27/2021 0525   ALKPHOS 101 06/30/2017 0846   AST 13 (L) 09/27/2021 0525   AST 14 (L) 09/17/2021 1228   AST 17 06/30/2017 0846   ALT 10 09/27/2021 0525   ALT <5 09/17/2021 1228   ALT <6 06/30/2017 0846   BILITOT 0.7 09/27/2021 0525   BILITOT 0.5 09/17/2021 1228   BILITOT 0.76 06/30/2017 0846      Impression and Plan:   81 year old woman with:    Colon cancer diagnosed in December 2022.  She was found to have T3N0 with a tumor that is MSI high and no evidence of metastatic disease. The natural course of this disease was reviewed at this time and treatment options were discussed.  The role of adjuvant chemotherapy at this particular juncture was discussed and I feel it is not needed for many reasons.  Given her MSI high tumor, age and comorbidities I think the benefit associated with chemotherapy is very marginal at best.  I have recommended continued active surveillance at this time.  We will obtain imaging studies in the next 6 months for surveillance purposes.   2.  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    I recommended continued monitoring at this time and we will update her labs and iron studies in the next 4 weeks and resume with factor support if needed.  IV iron infusion could also be used if needed.    3  Follow-up: Every 4 weeks for laboratory testing and injection and MD follow-up in 3 months.  30  minutes were spent on this visit.  Time was dedicated to reviewing laboratory data, disease status update and future plan of care discussion.          Zola Button MD 2/3/20233:27 PM

## 2021-11-12 ENCOUNTER — Inpatient Hospital Stay: Payer: Medicare Other

## 2021-11-12 ENCOUNTER — Telehealth: Payer: Self-pay | Admitting: *Deleted

## 2021-11-12 ENCOUNTER — Other Ambulatory Visit: Payer: Self-pay | Admitting: *Deleted

## 2021-11-12 ENCOUNTER — Telehealth: Payer: Self-pay | Admitting: Oncology

## 2021-11-12 DIAGNOSIS — D509 Iron deficiency anemia, unspecified: Secondary | ICD-10-CM

## 2021-11-12 DIAGNOSIS — D631 Anemia in chronic kidney disease: Secondary | ICD-10-CM

## 2021-11-12 DIAGNOSIS — C186 Malignant neoplasm of descending colon: Secondary | ICD-10-CM | POA: Diagnosis not present

## 2021-11-12 LAB — CBC WITH DIFFERENTIAL (CANCER CENTER ONLY)
Abs Immature Granulocytes: 0.01 10*3/uL (ref 0.00–0.07)
Basophils Absolute: 0 10*3/uL (ref 0.0–0.1)
Basophils Relative: 0 %
Eosinophils Absolute: 0 10*3/uL (ref 0.0–0.5)
Eosinophils Relative: 1 %
HCT: 21.1 % — ABNORMAL LOW (ref 36.0–46.0)
Hemoglobin: 7 g/dL — ABNORMAL LOW (ref 12.0–15.0)
Immature Granulocytes: 0 %
Lymphocytes Relative: 60 %
Lymphs Abs: 2 10*3/uL (ref 0.7–4.0)
MCH: 26.6 pg (ref 26.0–34.0)
MCHC: 33.2 g/dL (ref 30.0–36.0)
MCV: 80.2 fL (ref 80.0–100.0)
Monocytes Absolute: 0.2 10*3/uL (ref 0.1–1.0)
Monocytes Relative: 7 %
Neutro Abs: 1.1 10*3/uL — ABNORMAL LOW (ref 1.7–7.7)
Neutrophils Relative %: 32 %
Platelet Count: 123 10*3/uL — ABNORMAL LOW (ref 150–400)
RBC: 2.63 MIL/uL — ABNORMAL LOW (ref 3.87–5.11)
RDW: 18.6 % — ABNORMAL HIGH (ref 11.5–15.5)
WBC Count: 3.4 10*3/uL — ABNORMAL LOW (ref 4.0–10.5)
nRBC: 0 % (ref 0.0–0.2)

## 2021-11-12 LAB — CMP (CANCER CENTER ONLY)
ALT: 5 U/L (ref 0–44)
AST: 12 U/L — ABNORMAL LOW (ref 15–41)
Albumin: 3.2 g/dL — ABNORMAL LOW (ref 3.5–5.0)
Alkaline Phosphatase: 126 U/L (ref 38–126)
Anion gap: 6 (ref 5–15)
BUN: 21 mg/dL (ref 8–23)
CO2: 23 mmol/L (ref 22–32)
Calcium: 9 mg/dL (ref 8.9–10.3)
Chloride: 116 mmol/L — ABNORMAL HIGH (ref 98–111)
Creatinine: 1.82 mg/dL — ABNORMAL HIGH (ref 0.44–1.00)
GFR, Estimated: 27 mL/min — ABNORMAL LOW (ref >60)
Glucose, Bld: 73 mg/dL (ref 70–99)
Potassium: 3.7 mmol/L (ref 3.5–5.1)
Sodium: 145 mmol/L (ref 135–145)
Total Bilirubin: 0.4 mg/dL (ref 0.3–1.2)
Total Protein: 7.2 g/dL (ref 6.5–8.1)

## 2021-11-12 LAB — SAMPLE TO BLOOD BANK

## 2021-11-12 LAB — IRON AND IRON BINDING CAPACITY (CC-WL,HP ONLY)
Iron: 26 ug/dL — ABNORMAL LOW (ref 28–170)
Saturation Ratios: 15 % (ref 10.4–31.8)
TIBC: 169 ug/dL — ABNORMAL LOW (ref 250–450)
UIBC: 143 ug/dL — ABNORMAL LOW (ref 148–442)

## 2021-11-12 LAB — FERRITIN: Ferritin: 347 ng/mL — ABNORMAL HIGH (ref 11–307)

## 2021-11-12 LAB — PREPARE RBC (CROSSMATCH)

## 2021-11-12 NOTE — Telephone Encounter (Signed)
Sch per 2/10 inbasket, pt son aware. I spoke with nurse Ajia at [facility] where pt lives, she is aware of appts for lab work on 2/10 and blood on 2/11

## 2021-11-12 NOTE — Telephone Encounter (Signed)
Dr Inda Merlin called to say Ezmae's Hgb is around 7 and she is symptomatic. Needs transfusion. Requested Dr Alen Blew call him to discuss.  Transfusion being scheduled for Saturday with labs today.

## 2021-11-13 ENCOUNTER — Other Ambulatory Visit: Payer: Self-pay

## 2021-11-13 ENCOUNTER — Emergency Department (HOSPITAL_COMMUNITY)
Admission: EM | Admit: 2021-11-13 | Discharge: 2021-11-13 | Disposition: A | Discharge status: Home / Self Care | Payer: Medicare Other | Attending: Emergency Medicine | Admitting: Emergency Medicine

## 2021-11-13 ENCOUNTER — Encounter (HOSPITAL_COMMUNITY): Payer: Self-pay

## 2021-11-13 ENCOUNTER — Inpatient Hospital Stay: Payer: Medicare Other

## 2021-11-13 DIAGNOSIS — D649 Anemia, unspecified: Secondary | ICD-10-CM

## 2021-11-13 DIAGNOSIS — D509 Iron deficiency anemia, unspecified: Secondary | ICD-10-CM

## 2021-11-13 DIAGNOSIS — R630 Anorexia: Secondary | ICD-10-CM | POA: Diagnosis not present

## 2021-11-13 DIAGNOSIS — E039 Hypothyroidism, unspecified: Secondary | ICD-10-CM | POA: Diagnosis not present

## 2021-11-13 DIAGNOSIS — Z79899 Other long term (current) drug therapy: Secondary | ICD-10-CM | POA: Insufficient documentation

## 2021-11-13 DIAGNOSIS — R531 Weakness: Secondary | ICD-10-CM | POA: Diagnosis present

## 2021-11-13 DIAGNOSIS — I10 Essential (primary) hypertension: Secondary | ICD-10-CM | POA: Insufficient documentation

## 2021-11-13 MED ORDER — SODIUM CHLORIDE 0.9 % IV SOLN
10.0000 mL/h | Freq: Once | INTRAVENOUS | Status: AC
  Administered 2021-11-13: 10 mL/h via INTRAVENOUS

## 2021-11-13 MED ORDER — SODIUM CHLORIDE 0.9% IV SOLUTION: 250.0000 mL | Freq: Once | INTRAVENOUS | Status: DC

## 2021-11-13 MED ORDER — ACETAMINOPHEN 325 MG PO TABS
650.0000 mg | ORAL_TABLET | Freq: Once | ORAL | Status: AC
  Administered 2021-11-13: 650 mg via ORAL
  Filled 2021-11-13: qty 2

## 2021-11-13 MED ORDER — DIPHENHYDRAMINE HCL 25 MG PO CAPS
25.0000 mg | ORAL_CAPSULE | Freq: Once | ORAL | Status: AC
  Administered 2021-11-13: 25 mg via ORAL
  Filled 2021-11-13: qty 1

## 2021-11-13 NOTE — ED Notes (Signed)
Patient able to stand and pivot into son's car for transport home

## 2021-11-13 NOTE — ED Notes (Signed)
Transport called for patient  °

## 2021-11-13 NOTE — ED Provider Notes (Signed)
[facility] DEPT Provider Note   CSN: [id number] Arrival date & time: 11/13/21  1226     History  Chief Complaint  Patient presents with   Weakness    Susan [name] is a 81 y.o. female.  Pt is a 81 yo bf with a hx of colon cancer s/p resection on 10/01/2021.  She also has a hx of multifactorial anemia (iron, chronic renal insuff, and MDS), htn, hyperlipidemia, hypothyroidism, gerd, and hx of GI bleed.  Pt has been in rehab since her surgery and has lost some weight.  Her son said she has not had an appetite.  She is not currently getting chemo because there were no mets.  Pt had some blood drawn yesterday which showed a slight worsening of the anemia.  Her doctor ordered 1 unit to be transfused at the cancer center.  However, they were unable to get an IV, so she was sent here.      Home Medications Prior to Admission medications   Medication Sig Start Date End Date Taking? Authorizing Provider  acetaminophen (TYLENOL) 500 MG tablet Take 500 mg by mouth every 6 (six) hours as needed for moderate pain (for pain.).    [provider]  allopurinol (ZYLOPRIM) 300 MG tablet Take 300 mg by mouth daily. 07/20/20   [provider]  donepezil (ARICEPT) 10 MG tablet Take 10 mg by mouth daily. 07/20/20   [provider]  famotidine (PEPCID) 20 MG tablet Take 20 mg by mouth 2 (two) times daily. 03/08/21   [provider]  ferrous sulfate 325 (65 FE) MG tablet Take 1 tablet (325 mg total) by mouth daily with breakfast. 10/09/21   [place] Donnamarie Poag, DO  gabapentin (NEURONTIN) 100 MG capsule Take 2 capsules (200 mg total) by mouth 3 (three) times daily. 10/08/21   [place] Donnamarie Poag, DO  levothyroxine (SYNTHROID) 75 MCG tablet Take 75 mcg by mouth every morning. 12/18/20   [provider]  memantine (NAMENDA) 10 MG tablet Take 1 tablet (10 mg total) by mouth 2 (two) times daily. 11/30/20   [name], NP  mirtazapine (REMERON) 15 MG  tablet Take 15 mg by mouth at bedtime. 07/20/20   [provider]  montelukast (SINGULAIR) 10 MG tablet Take 10 mg by mouth at bedtime.    [provider]  pantoprazole (PROTONIX) 40 MG tablet Take 1 tablet (40 mg total) by mouth 2 (two) times daily. 10/08/21   [place] Donnamarie Poag, DO  polycarbophil (FIBERCON) 625 MG tablet Take 1 tablet (625 mg total) by mouth 2 (two) times daily. 10/08/21   [place] Donnamarie Poag, DO      Allergies    Codeine, Penicillins, Bactrim, Clarithromycin, Flagyl [metronidazole hcl], Food, Pineapple, and Sulfa antibiotics    Review of Systems   Review of Systems  Neurological:  Positive for weakness.  All other systems reviewed and are negative.  Physical Exam Updated Vital Signs BP (!) 167/112    Pulse 61    Temp 98 F (36.7 C) (Oral)    Resp (!) 6    Ht 5\' 2"  (1.575 m)    Wt 50.3 kg    SpO2 99%    BMI 20.30 kg/m  Physical Exam Vitals and nursing note reviewed.  Constitutional:      Appearance: Normal appearance.  HENT:     Head: Normocephalic and atraumatic.     Right Ear: External ear normal.     Left Ear: External ear normal.  Nose: Nose normal.     Mouth/Throat:     Mouth: Mucous membranes are dry.  Eyes:     Extraocular Movements: Extraocular movements intact.     Conjunctiva/sclera: Conjunctivae normal.     Pupils: Pupils are equal, round, and reactive to light.  Cardiovascular:     Rate and Rhythm: Normal rate and regular rhythm.     Pulses: Normal pulses.     Heart sounds: Normal heart sounds.  Pulmonary:     Effort: Pulmonary effort is normal.     Breath sounds: Normal breath sounds.  Abdominal:     General: Abdomen is flat. Bowel sounds are normal.     Palpations: Abdomen is soft.  Musculoskeletal:        General: Normal range of motion.     Cervical back: Normal range of motion and neck supple.  Skin:    General: Skin is warm.     Capillary Refill: Capillary refill takes less than 2 seconds.  Neurological:     General: No  focal deficit present.     Mental Status: She is alert and oriented to person, place, and time.  Psychiatric:        Mood and Affect: Mood normal.        Behavior: Behavior normal.    ED Results / Procedures / Treatments   Labs (all labs ordered are listed, but only abnormal results are displayed) Labs Reviewed  PREPARE RBC (CROSSMATCH)    EKG None  Radiology No results found.  Procedures Procedures    Medications Ordered in ED Medications  0.9 %  sodium chloride infusion (10 mL/hr Intravenous New Bag/Given 11/13/21 1348)    ED Course/ Medical Decision Making/ A&P                           Medical Decision Making Amount and/or Complexity of Data Reviewed Labs: ordered.   This patient presents to the ED for concern of weakness, this involves an extensive number of treatment options, and is a complaint that carries with it a high risk of complications and morbidity.  The differential diagnosis includes anemia, metabolic issue, uti   Co morbidities that complicate the patient evaluation  Multifactorial anemia   Additional history obtained:  Additional history obtained from epic chart review External records from outside source obtained and reviewed including pt's son   Lab Tests:  I personally reviewed and interpreted labs done yesterday.  The pertinent results include:  Hgb of 7  Cardiac Monitoring:  The patient was maintained on a cardiac monitor.  I personally viewed and interpreted the cardiac monitored which showed an underlying rhythm of: nsr   Medicines ordered and prescription drug management:  I ordered medication including blood  for anemia  Reevaluation of the patient after these medicines showed that the patient improved I have reviewed the patients home medicines and have made adjustments as needed   Critical Interventions:  Blood transfusion for symptomatic anemia  Problem List / ED Course:  Anemia:  Transfusion  given   Reevaluation:  After the interventions noted above, I reevaluated the patient and found that they have :improved   Social Determinants of Health:  Lives at rehab   Dispostion:  After consideration of the diagnostic results and the patients response to treatment, I feel that the patent would benefit from discharge with outpatient f/u .   Pt has not had much of an appetite.  She is encouraged to eat.  Pt's son will help encourage this.  CRITICAL CARE Performed by: Isla Pence   Total critical care time: 30 minutes  Critical care time was exclusive of separately billable procedures and treating other patients.  Critical care was necessary to treat or prevent imminent or life-threatening deterioration.  Critical care was time spent personally by me on the following activities: development of treatment plan with patient and/or surrogate as well as nursing, discussions with consultants, evaluation of patient's response to treatment, examination of patient, obtaining history from patient or surrogate, ordering and performing treatments and interventions, ordering and review of laboratory studies, ordering and review of radiographic studies, pulse oximetry and re-evaluation of patient's condition.   Final Clinical Impression(s) / ED Diagnoses Final diagnoses:  Symptomatic anemia  Loss of appetite    Rx / DC Orders ED Discharge Orders     None         Isla Pence, MD 11/13/21 [id number]

## 2021-11-13 NOTE — Progress Notes (Signed)
Pt was transported to [facility] ED for further eval at 1220

## 2021-11-13 NOTE — ED Triage Notes (Signed)
Patient brought to ED via stretcher from [facility]. Per the cancer center, hgb was 7 yesterday and showed up today for transfusion. Multiple attempts by cancer center staff and iv team for IV without success.

## 2021-11-13 NOTE — Progress Notes (Signed)
Pt arrived late by transportation on stretcher with son accompanying. Staff attempted to place IV for blood transfusion with no success. IV team was called and also made several attempts to access IV line with no success. Pt son requested pt be sent to ED for further evaluation. Called ED charge, Frederico Hamman, pt will be transferred over to [facility] ED for further eval. Pt son notified and verbalized understanding. Provider also notified via inbasket

## 2021-11-15 LAB — BPAM RBC
Blood Product Expiration Date: 202302222359
Blood Product Expiration Date: 202303162359
ISSUE DATE / TIME: 202302111031
ISSUE DATE / TIME: 202302111325
Unit Type and Rh: 5100
Unit Type and Rh: 5100

## 2021-11-15 LAB — TYPE AND SCREEN
ABO/RH(D): O POS
Antibody Screen: POSITIVE
Donor AG Type: NEGATIVE
Donor AG Type: NEGATIVE
Unit division: 0
Unit division: 0

## 2021-11-19 ENCOUNTER — Other Ambulatory Visit: Payer: Self-pay

## 2021-11-19 ENCOUNTER — Emergency Department (HOSPITAL_COMMUNITY): Payer: Medicare Other

## 2021-11-19 ENCOUNTER — Inpatient Hospital Stay (HOSPITAL_COMMUNITY)
Admission: EM | Admit: 2021-11-19 | Discharge: 2021-11-22 | DRG: 682 | Disposition: A | Discharge status: Skilled Nursing Facility | Payer: Medicare Other | Attending: Family Medicine | Admitting: Family Medicine

## 2021-11-19 DIAGNOSIS — Z85038 Personal history of other malignant neoplasm of large intestine: Secondary | ICD-10-CM

## 2021-11-19 DIAGNOSIS — Z87891 Personal history of nicotine dependence: Secondary | ICD-10-CM

## 2021-11-19 DIAGNOSIS — Z8052 Family history of malignant neoplasm of bladder: Secondary | ICD-10-CM

## 2021-11-19 DIAGNOSIS — Z882 Allergy status to sulfonamides status: Secondary | ICD-10-CM

## 2021-11-19 DIAGNOSIS — Z8249 Family history of ischemic heart disease and other diseases of the circulatory system: Secondary | ICD-10-CM

## 2021-11-19 DIAGNOSIS — Z20822 Contact with and (suspected) exposure to covid-19: Secondary | ICD-10-CM | POA: Diagnosis present

## 2021-11-19 DIAGNOSIS — R4182 Altered mental status, unspecified: Secondary | ICD-10-CM | POA: Diagnosis not present

## 2021-11-19 DIAGNOSIS — F05 Delirium due to known physiological condition: Secondary | ICD-10-CM | POA: Diagnosis not present

## 2021-11-19 DIAGNOSIS — Z79890 Hormone replacement therapy (postmenopausal): Secondary | ICD-10-CM

## 2021-11-19 DIAGNOSIS — I129 Hypertensive chronic kidney disease with stage 1 through stage 4 chronic kidney disease, or unspecified chronic kidney disease: Secondary | ICD-10-CM | POA: Diagnosis present

## 2021-11-19 DIAGNOSIS — D696 Thrombocytopenia, unspecified: Secondary | ICD-10-CM | POA: Diagnosis present

## 2021-11-19 DIAGNOSIS — G9340 Encephalopathy, unspecified: Secondary | ICD-10-CM | POA: Diagnosis present

## 2021-11-19 DIAGNOSIS — N179 Acute kidney failure, unspecified: Principal | ICD-10-CM

## 2021-11-19 DIAGNOSIS — I10 Essential (primary) hypertension: Secondary | ICD-10-CM | POA: Diagnosis present

## 2021-11-19 DIAGNOSIS — E039 Hypothyroidism, unspecified: Secondary | ICD-10-CM | POA: Diagnosis present

## 2021-11-19 DIAGNOSIS — F0390 Unspecified dementia without behavioral disturbance: Secondary | ICD-10-CM | POA: Diagnosis present

## 2021-11-19 DIAGNOSIS — N184 Chronic kidney disease, stage 4 (severe): Secondary | ICD-10-CM | POA: Diagnosis present

## 2021-11-19 DIAGNOSIS — Z885 Allergy status to narcotic agent status: Secondary | ICD-10-CM

## 2021-11-19 DIAGNOSIS — M1990 Unspecified osteoarthritis, unspecified site: Secondary | ICD-10-CM | POA: Diagnosis present

## 2021-11-19 DIAGNOSIS — T426X5A Adverse effect of other antiepileptic and sedative-hypnotic drugs, initial encounter: Secondary | ICD-10-CM | POA: Diagnosis present

## 2021-11-19 DIAGNOSIS — Z881 Allergy status to other antibiotic agents status: Secondary | ICD-10-CM

## 2021-11-19 DIAGNOSIS — G928 Other toxic encephalopathy: Secondary | ICD-10-CM | POA: Diagnosis present

## 2021-11-19 DIAGNOSIS — K219 Gastro-esophageal reflux disease without esophagitis: Secondary | ICD-10-CM | POA: Diagnosis present

## 2021-11-19 DIAGNOSIS — Z823 Family history of stroke: Secondary | ICD-10-CM

## 2021-11-19 DIAGNOSIS — C182 Malignant neoplasm of ascending colon: Secondary | ICD-10-CM | POA: Diagnosis present

## 2021-11-19 DIAGNOSIS — Z9049 Acquired absence of other specified parts of digestive tract: Secondary | ICD-10-CM

## 2021-11-19 DIAGNOSIS — Z91018 Allergy to other foods: Secondary | ICD-10-CM

## 2021-11-19 DIAGNOSIS — E785 Hyperlipidemia, unspecified: Secondary | ICD-10-CM | POA: Diagnosis present

## 2021-11-19 DIAGNOSIS — N390 Urinary tract infection, site not specified: Secondary | ICD-10-CM | POA: Diagnosis present

## 2021-11-19 DIAGNOSIS — M109 Gout, unspecified: Secondary | ICD-10-CM | POA: Diagnosis present

## 2021-11-19 DIAGNOSIS — Z79899 Other long term (current) drug therapy: Secondary | ICD-10-CM

## 2021-11-19 DIAGNOSIS — Z880 Allergy status to penicillin: Secondary | ICD-10-CM

## 2021-11-19 DIAGNOSIS — D500 Iron deficiency anemia secondary to blood loss (chronic): Secondary | ICD-10-CM | POA: Diagnosis present

## 2021-11-19 DIAGNOSIS — K5732 Diverticulitis of large intestine without perforation or abscess without bleeding: Secondary | ICD-10-CM | POA: Diagnosis present

## 2021-11-19 LAB — RESP PANEL BY RT-PCR (FLU A&B, COVID) ARPGX2
Influenza A by PCR: NEGATIVE
Influenza B by PCR: NEGATIVE
SARS Coronavirus 2 by RT PCR: NEGATIVE

## 2021-11-19 LAB — COMPREHENSIVE METABOLIC PANEL
ALT: 7 U/L (ref 0–44)
AST: 16 U/L (ref 15–41)
Albumin: 3.2 g/dL — ABNORMAL LOW (ref 3.5–5.0)
Alkaline Phosphatase: 116 U/L (ref 38–126)
Anion gap: 12 (ref 5–15)
BUN: 27 mg/dL — ABNORMAL HIGH (ref 8–23)
CO2: 21 mmol/L — ABNORMAL LOW (ref 22–32)
Calcium: 9.6 mg/dL (ref 8.9–10.3)
Chloride: 108 mmol/L (ref 98–111)
Creatinine, Ser: 2.72 mg/dL — ABNORMAL HIGH (ref 0.44–1.00)
GFR, Estimated: 17 mL/min — ABNORMAL LOW (ref >60)
Glucose, Bld: 91 mg/dL (ref 70–99)
Potassium: 3.7 mmol/L (ref 3.5–5.1)
Sodium: 141 mmol/L (ref 135–145)
Total Bilirubin: 0.5 mg/dL (ref 0.3–1.2)
Total Protein: 7.8 g/dL (ref 6.5–8.1)

## 2021-11-19 LAB — I-STAT VENOUS BLOOD GAS, ED
Acid-base deficit: 2 mmol/L (ref 0.0–2.0)
Bicarbonate: 23.6 mmol/L (ref 20.0–28.0)
Calcium, Ion: 1.24 mmol/L (ref 1.15–1.40)
HCT: 28 % — ABNORMAL LOW (ref 36.0–46.0)
Hemoglobin: 9.5 g/dL — ABNORMAL LOW (ref 12.0–15.0)
O2 Saturation: 93 %
Potassium: 3.6 mmol/L (ref 3.5–5.1)
Sodium: 145 mmol/L (ref 135–145)
TCO2: 25 mmol/L (ref 22–32)
pCO2, Ven: 40.6 mmHg — ABNORMAL LOW (ref 44–60)
pH, Ven: 7.373 (ref 7.25–7.43)
pO2, Ven: 70 mmHg — ABNORMAL HIGH (ref 32–45)

## 2021-11-19 LAB — CBC WITH DIFFERENTIAL/PLATELET
Abs Immature Granulocytes: 0.02 10*3/uL (ref 0.00–0.07)
Basophils Absolute: 0 10*3/uL (ref 0.0–0.1)
Basophils Relative: 0 %
Eosinophils Absolute: 0 10*3/uL (ref 0.0–0.5)
Eosinophils Relative: 0 %
HCT: 30 % — ABNORMAL LOW (ref 36.0–46.0)
Hemoglobin: 9.6 g/dL — ABNORMAL LOW (ref 12.0–15.0)
Immature Granulocytes: 0 %
Lymphocytes Relative: 22 %
Lymphs Abs: 1.5 10*3/uL (ref 0.7–4.0)
MCH: 26.3 pg (ref 26.0–34.0)
MCHC: 32 g/dL (ref 30.0–36.0)
MCV: 82.2 fL (ref 80.0–100.0)
Monocytes Absolute: 0.3 10*3/uL (ref 0.1–1.0)
Monocytes Relative: 4 %
Neutro Abs: 5 10*3/uL (ref 1.7–7.7)
Neutrophils Relative %: 74 %
Platelets: UNDETERMINED 10*3/uL (ref 150–400)
RBC: 3.65 MIL/uL — ABNORMAL LOW (ref 3.87–5.11)
RDW: 17.1 % — ABNORMAL HIGH (ref 11.5–15.5)
WBC: 6.9 10*3/uL (ref 4.0–10.5)
nRBC: 0 % (ref 0.0–0.2)

## 2021-11-19 LAB — URINALYSIS, MICROSCOPIC (REFLEX)

## 2021-11-19 LAB — URINALYSIS, ROUTINE W REFLEX MICROSCOPIC
Glucose, UA: NEGATIVE mg/dL
Hgb urine dipstick: NEGATIVE
Ketones, ur: NEGATIVE mg/dL
Nitrite: NEGATIVE
Protein, ur: 100 mg/dL — AB
Specific Gravity, Urine: 1.02 (ref 1.005–1.030)
pH: 6 (ref 5.0–8.0)

## 2021-11-19 LAB — LACTIC ACID, PLASMA: Lactic Acid, Venous: 1.3 mmol/L (ref 0.5–1.9)

## 2021-11-19 LAB — AMMONIA: Ammonia: 14 umol/L (ref 9–35)

## 2021-11-19 MED ORDER — SODIUM CHLORIDE 0.9 % IV SOLN
1.0000 g | Freq: Once | INTRAVENOUS | Status: AC
  Administered 2021-11-19: 1 g via INTRAVENOUS
  Filled 2021-11-19: qty 1

## 2021-11-19 MED ORDER — LACTATED RINGERS IV BOLUS
1000.0000 mL | Freq: Once | INTRAVENOUS | Status: AC
  Administered 2021-11-19: 1000 mL via INTRAVENOUS

## 2021-11-19 MED ORDER — SODIUM CHLORIDE 0.9 % IV BOLUS: 500.0000 mL | Freq: Once | INTRAVENOUS | Status: DC

## 2021-11-19 NOTE — ED Notes (Signed)
Pt transported to CT ?

## 2021-11-19 NOTE — ED Provider Notes (Signed)
[facility] EMERGENCY DEPARTMENT Provider Note   CSN: [id number] Arrival date & time: 11/19/21  1611  LEVEL 5 CAVEAT - DEMENTIA   History  Chief Complaint  Patient presents with   Altered Mental Status    Susan [name] is a 81 y.o. female.  HPI 81 year old female presents with an episode of unresponsiveness and lethargy.  History is primarily from the son who is currently at the bedside.  He was on his way to go see her at the facility and they called and told him that she was "unresponsive".  By the time he got there she seemed to be responsive but seemed lethargic and sleepy.  She seems weaker than normal but her mental status to him is normal for her.  She has a history of chronic dementia.  She has been getting weaker and losing weight ever since getting into the rehab facility after her colon surgery for colon cancer.  Last week she received a unit of blood for a blood transfusion.  No known illness and she seemed fine when family visited her yesterday, though the son was not there.  No recent medication changes according to the son.  Patient states she has a little bit of a headache when asked but otherwise states she feels good.  Home Medications Prior to Admission medications   Medication Sig Start Date End Date Taking? Authorizing Provider  acetaminophen (TYLENOL) 500 MG tablet Take 500 mg by mouth every 6 (six) hours as needed for moderate pain (for pain.).    [provider]  allopurinol (ZYLOPRIM) 300 MG tablet Take 300 mg by mouth daily. 07/20/20   [provider]  donepezil (ARICEPT) 10 MG tablet Take 10 mg by mouth daily. 07/20/20   [provider]  famotidine (PEPCID) 20 MG tablet Take 20 mg by mouth 2 (two) times daily. 03/08/21   [provider]  ferrous sulfate 325 (65 FE) MG tablet Take 1 tablet (325 mg total) by mouth daily with breakfast. 10/09/21   [place] Donnamarie Poag, DO  gabapentin (NEURONTIN) 100 MG capsule Take 2  capsules (200 mg total) by mouth 3 (three) times daily. 10/08/21   [place] Donnamarie Poag, DO  levothyroxine (SYNTHROID) 75 MCG tablet Take 75 mcg by mouth every morning. 12/18/20   [provider]  memantine (NAMENDA) 10 MG tablet Take 1 tablet (10 mg total) by mouth 2 (two) times daily. 11/30/20   [name], NP  mirtazapine (REMERON) 15 MG tablet Take 15 mg by mouth at bedtime. 07/20/20   [provider]  montelukast (SINGULAIR) 10 MG tablet Take 10 mg by mouth at bedtime.    [provider]  pantoprazole (PROTONIX) 40 MG tablet Take 1 tablet (40 mg total) by mouth 2 (two) times daily. 10/08/21   [place] Donnamarie Poag, DO  polycarbophil (FIBERCON) 625 MG tablet Take 1 tablet (625 mg total) by mouth 2 (two) times daily. 10/08/21   [place] Donnamarie Poag, DO      Allergies    Codeine, Penicillins, Bactrim, Clarithromycin, Flagyl [metronidazole hcl], Food, Pineapple, and Sulfa antibiotics    Review of Systems   Review of Systems  Unable to perform ROS: Mental status change   Physical Exam Updated Vital Signs BP 127/81    Pulse (!) 102    Temp 97.8 F (36.6 C) (Rectal)    Resp 17    Ht 5\' 2"  (1.575 m)    Wt 45.8 kg    SpO2 100%  BMI 18.47 kg/m  Physical Exam Vitals and nursing note reviewed.  Constitutional:      Appearance: She is well-developed.     Comments: Patient will talk to me when she is directly talk to but otherwise seems to close her eyes.  HENT:     Head: Normocephalic and atraumatic.  Eyes:     Pupils: Pupils are equal, round, and reactive to light.  Cardiovascular:     Rate and Rhythm: Normal rate and regular rhythm.     Heart sounds: Normal heart sounds.  Pulmonary:     Effort: Pulmonary effort is normal.     Breath sounds: Normal breath sounds.  Abdominal:     Palpations: Abdomen is soft.     Tenderness: There is no abdominal tenderness.  Skin:    General: Skin is warm and dry.  Neurological:     Mental Status: She is alert.     Comments: Alert and  oriented to self and place. Disoriented to time/situation. At baseline per son at bedside.  She is diffusely weak in all 4 extremities and is hard to get her to lift her legs very much for strength testing.    ED Results / Procedures / Treatments   Labs (all labs ordered are listed, but only abnormal results are displayed) Labs Reviewed  COMPREHENSIVE METABOLIC PANEL - Abnormal; Notable for the following components:      Result Value   CO2 21 (*)    BUN 27 (*)    Creatinine, Ser 2.72 (*)    Albumin 3.2 (*)    GFR, Estimated 17 (*)    All other components within normal limits  URINALYSIS, ROUTINE W REFLEX MICROSCOPIC - Abnormal; Notable for the following components:   Bilirubin Urine SMALL (*)    Protein, ur 100 (*)    Leukocytes,Ua SMALL (*)    All other components within normal limits  CBC WITH DIFFERENTIAL/PLATELET - Abnormal; Notable for the following components:   RBC 3.65 (*)    Hemoglobin 9.6 (*)    HCT 30.0 (*)    RDW 17.1 (*)    All other components within normal limits  URINALYSIS, MICROSCOPIC (REFLEX) - Abnormal; Notable for the following components:   Bacteria, UA MANY (*)    All other components within normal limits  I-STAT VENOUS BLOOD GAS, ED - Abnormal; Notable for the following components:   pCO2, Ven 40.6 (*)    pO2, Ven 70 (*)    HCT 28.0 (*)    Hemoglobin 9.5 (*)    All other components within normal limits  RESP PANEL BY RT-PCR (FLU A&B, COVID) ARPGX2  URINE CULTURE  LACTIC ACID, PLASMA  AMMONIA  LACTIC ACID, PLASMA  CBG MONITORING, ED    EKG EKG Interpretation  Date/Time:  Friday November 19 2021 16:26:15 EST Ventricular Rate:  73 PR Interval:  187 QRS Duration: 91 QT Interval:  406 QTC Calculation: 448 R Axis:   41 Text Interpretation: Sinus rhythm no significant change since Aug 2022 Confirmed by Sherwood Gambler [phone]) on 11/19/2021 6:51:53 PM  Radiology CT Head Wo Contrast  Result Date: 11/19/2021 CLINICAL DATA:  Delirium EXAM: CT HEAD  WITHOUT CONTRAST TECHNIQUE: Contiguous axial images were obtained from the base of the skull through the vertex without intravenous contrast. RADIATION DOSE REDUCTION: This exam was performed according to the departmental dose-optimization program which includes automated exposure control, adjustment of the mA and/or kV according to patient size and/or use of iterative reconstruction technique. COMPARISON:  CT head  dated August 06, 2021 FINDINGS: Brain: No evidence of acute infarction, hemorrhage, hydrocephalus, extra-axial collection or mass lesion/mass effect. Moderate cerebral atrophy and advanced microvascular ischemic changes of the white matter. Vascular: No hyperdense vessel or unexpected calcification. Skull: Normal. Negative for fracture or focal lesion. Sinuses/Orbits: No acute finding. Other: None. IMPRESSION: 1. No acute intracranial abnormality. 2. Advanced cerebral atrophy and microvascular ischemic changes of the white matter, unchanged. Electronically Signed   By: Keane Police D.O.   On: 11/19/2021 18:32   DG Chest Portable 1 View  Result Date: 11/19/2021 CLINICAL DATA:  Altered mental status. EXAM: PORTABLE CHEST 1 VIEW COMPARISON:  Chest two views 06/21/2021 FINDINGS: Cardiac silhouette is again at the upper limits of normal size. There is again a tortuous thoracic aorta. Mild calcification is again seen within aortic arch. The lungs are clear. No pulmonary edema, pleural effusion, or pneumothorax. There is a screw anchor overlying the superolateral left humeral head. IMPRESSION: No acute lung process. Electronically Signed   By: Yvonne Kendall M.D.   On: 11/19/2021 17:14    Procedures Procedures    Medications Ordered in ED Medications  aztreonam (AZACTAM) 1 g in sodium chloride 0.9 % 100 mL IVPB (has no administration in time range)  lactated ringers bolus 1,000 mL (1,000 mLs Intravenous New Bag/Given 11/19/21 2103)    ED Course/ Medical Decision Making/ A&P                             Patient presents with change in mental status.  Appears to be more lethargy than anything else.  I suspect this is related to her acute kidney injury.  It seems that her creatinine is increased from 1.8-2.7 from last week.  Even before that her baseline is closer to 1.4.  Son tells me she has not been eating and drinking well ever since being into rehabilitation.  Her urinalysis has been interpreted by me as well and is concerning for a possible urinary tract infection.  We will start antibiotics.  Otherwise her WBC is normal.  Ammonia, lactate, COVID/flu testing is normal.  Hemoglobin is improved from last week when she received a transfusion.  CT head and chest x-ray have been personally reviewed by myself and show no obvious bleeding or pneumonia respectively.  At this point, I think she will need IV fluids and was given IV fluids and IV antibiotics.  Discussed with Dr. Hal Hope for admission.        Final Clinical Impression(s) / ED Diagnoses Final diagnoses:  Acute kidney injury ([place])  Acute urinary tract infection    Rx / DC Orders ED Discharge Orders     None         Sherwood Gambler, MD 11/19/21 2225

## 2021-11-19 NOTE — ED Notes (Incomplete)
The pt is very confused she wants to get out of the bed  she does not know where she is

## 2021-11-19 NOTE — ED Notes (Signed)
X-ray at bedside

## 2021-11-19 NOTE — ED Triage Notes (Signed)
Pt BIB [facility] from [facility] after facility called reporting patient was unresponsive. Fire arrived and reported pt breathing 4 times a minute. Upon EMS arrival patient was responsive to pain and aroused. Patient has been A&O to baseline, hx of dementia. Patient reports feeling tired. RR 14, BP 105/71

## 2021-11-19 NOTE — ED Notes (Signed)
Family left bedside, patient resting at this time.

## 2021-11-19 NOTE — Progress Notes (Signed)
Unsuccessful at obtaining ABG. Pt didn't want to be stuck more than once. Family member at bedside.

## 2021-11-20 ENCOUNTER — Encounter (HOSPITAL_COMMUNITY): Payer: Self-pay | Admitting: Internal Medicine

## 2021-11-20 ENCOUNTER — Other Ambulatory Visit: Payer: Self-pay

## 2021-11-20 ENCOUNTER — Observation Stay (HOSPITAL_COMMUNITY): Payer: Medicare Other

## 2021-11-20 DIAGNOSIS — F0390 Unspecified dementia without behavioral disturbance: Secondary | ICD-10-CM

## 2021-11-20 DIAGNOSIS — Z8249 Family history of ischemic heart disease and other diseases of the circulatory system: Secondary | ICD-10-CM | POA: Diagnosis not present

## 2021-11-20 DIAGNOSIS — Z880 Allergy status to penicillin: Secondary | ICD-10-CM | POA: Diagnosis not present

## 2021-11-20 DIAGNOSIS — N184 Chronic kidney disease, stage 4 (severe): Secondary | ICD-10-CM | POA: Diagnosis present

## 2021-11-20 DIAGNOSIS — F05 Delirium due to known physiological condition: Secondary | ICD-10-CM | POA: Diagnosis not present

## 2021-11-20 DIAGNOSIS — N179 Acute kidney failure, unspecified: Secondary | ICD-10-CM

## 2021-11-20 DIAGNOSIS — G9340 Encephalopathy, unspecified: Secondary | ICD-10-CM

## 2021-11-20 DIAGNOSIS — I129 Hypertensive chronic kidney disease with stage 1 through stage 4 chronic kidney disease, or unspecified chronic kidney disease: Secondary | ICD-10-CM | POA: Diagnosis present

## 2021-11-20 DIAGNOSIS — G928 Other toxic encephalopathy: Secondary | ICD-10-CM | POA: Diagnosis present

## 2021-11-20 DIAGNOSIS — I10 Essential (primary) hypertension: Secondary | ICD-10-CM | POA: Diagnosis not present

## 2021-11-20 DIAGNOSIS — D696 Thrombocytopenia, unspecified: Secondary | ICD-10-CM | POA: Diagnosis present

## 2021-11-20 DIAGNOSIS — Z79899 Other long term (current) drug therapy: Secondary | ICD-10-CM | POA: Diagnosis not present

## 2021-11-20 DIAGNOSIS — C182 Malignant neoplasm of ascending colon: Secondary | ICD-10-CM | POA: Diagnosis not present

## 2021-11-20 DIAGNOSIS — Z885 Allergy status to narcotic agent status: Secondary | ICD-10-CM | POA: Diagnosis not present

## 2021-11-20 DIAGNOSIS — Z881 Allergy status to other antibiotic agents status: Secondary | ICD-10-CM | POA: Diagnosis not present

## 2021-11-20 DIAGNOSIS — Z882 Allergy status to sulfonamides status: Secondary | ICD-10-CM | POA: Diagnosis not present

## 2021-11-20 DIAGNOSIS — D500 Iron deficiency anemia secondary to blood loss (chronic): Secondary | ICD-10-CM | POA: Diagnosis present

## 2021-11-20 DIAGNOSIS — K219 Gastro-esophageal reflux disease without esophagitis: Secondary | ICD-10-CM | POA: Diagnosis present

## 2021-11-20 DIAGNOSIS — N390 Urinary tract infection, site not specified: Secondary | ICD-10-CM | POA: Diagnosis present

## 2021-11-20 DIAGNOSIS — E785 Hyperlipidemia, unspecified: Secondary | ICD-10-CM | POA: Diagnosis present

## 2021-11-20 DIAGNOSIS — K5732 Diverticulitis of large intestine without perforation or abscess without bleeding: Secondary | ICD-10-CM | POA: Diagnosis present

## 2021-11-20 DIAGNOSIS — R4182 Altered mental status, unspecified: Secondary | ICD-10-CM | POA: Diagnosis present

## 2021-11-20 DIAGNOSIS — E039 Hypothyroidism, unspecified: Secondary | ICD-10-CM | POA: Diagnosis present

## 2021-11-20 DIAGNOSIS — M1990 Unspecified osteoarthritis, unspecified site: Secondary | ICD-10-CM | POA: Diagnosis present

## 2021-11-20 DIAGNOSIS — Z79890 Hormone replacement therapy (postmenopausal): Secondary | ICD-10-CM | POA: Diagnosis not present

## 2021-11-20 DIAGNOSIS — Z85038 Personal history of other malignant neoplasm of large intestine: Secondary | ICD-10-CM | POA: Diagnosis not present

## 2021-11-20 DIAGNOSIS — Z91018 Allergy to other foods: Secondary | ICD-10-CM | POA: Diagnosis not present

## 2021-11-20 DIAGNOSIS — Z20822 Contact with and (suspected) exposure to covid-19: Secondary | ICD-10-CM | POA: Diagnosis present

## 2021-11-20 LAB — CBC WITH DIFFERENTIAL/PLATELET
Abs Immature Granulocytes: 0.02 10*3/uL (ref 0.00–0.07)
Basophils Absolute: 0 10*3/uL (ref 0.0–0.1)
Basophils Relative: 0 %
Eosinophils Absolute: 0 10*3/uL (ref 0.0–0.5)
Eosinophils Relative: 0 %
HCT: 26.6 % — ABNORMAL LOW (ref 36.0–46.0)
Hemoglobin: 8.7 g/dL — ABNORMAL LOW (ref 12.0–15.0)
Immature Granulocytes: 0 %
Lymphocytes Relative: 41 %
Lymphs Abs: 2.2 10*3/uL (ref 0.7–4.0)
MCH: 26.9 pg (ref 26.0–34.0)
MCHC: 32.7 g/dL (ref 30.0–36.0)
MCV: 82.1 fL (ref 80.0–100.0)
Monocytes Absolute: 0.4 10*3/uL (ref 0.1–1.0)
Monocytes Relative: 7 %
Neutro Abs: 2.8 10*3/uL (ref 1.7–7.7)
Neutrophils Relative %: 52 %
Platelets: 101 10*3/uL — ABNORMAL LOW (ref 150–400)
RBC: 3.24 MIL/uL — ABNORMAL LOW (ref 3.87–5.11)
RDW: 17.2 % — ABNORMAL HIGH (ref 11.5–15.5)
WBC: 5.5 10*3/uL (ref 4.0–10.5)
nRBC: 0 % (ref 0.0–0.2)

## 2021-11-20 LAB — COMPREHENSIVE METABOLIC PANEL
ALT: 6 U/L (ref 0–44)
AST: 14 U/L — ABNORMAL LOW (ref 15–41)
Albumin: 2.8 g/dL — ABNORMAL LOW (ref 3.5–5.0)
Alkaline Phosphatase: 103 U/L (ref 38–126)
Anion gap: 9 (ref 5–15)
BUN: 26 mg/dL — ABNORMAL HIGH (ref 8–23)
CO2: 22 mmol/L (ref 22–32)
Calcium: 9 mg/dL (ref 8.9–10.3)
Chloride: 109 mmol/L (ref 98–111)
Creatinine, Ser: 2.4 mg/dL — ABNORMAL HIGH (ref 0.44–1.00)
GFR, Estimated: 20 mL/min — ABNORMAL LOW (ref >60)
Glucose, Bld: 86 mg/dL (ref 70–99)
Potassium: 3.8 mmol/L (ref 3.5–5.1)
Sodium: 140 mmol/L (ref 135–145)
Total Bilirubin: 0.4 mg/dL (ref 0.3–1.2)
Total Protein: 6.7 g/dL (ref 6.5–8.1)

## 2021-11-20 MED ORDER — SODIUM CHLORIDE 0.9 % IV SOLN: 1.0000 g | INTRAVENOUS | Status: DC

## 2021-11-20 MED ORDER — FAMOTIDINE 20 MG PO TABS
20.0000 mg | ORAL_TABLET | Freq: Two times a day (BID) | ORAL | Status: DC
  Administered 2021-11-20 – 2021-11-22 (×5): 20 mg via ORAL
  Filled 2021-11-20 (×5): qty 1

## 2021-11-20 MED ORDER — LEVOTHYROXINE SODIUM 75 MCG PO TABS
75.0000 ug | ORAL_TABLET | Freq: Every day | ORAL | Status: DC
  Administered 2021-11-21 – 2021-11-22 (×2): 75 ug via ORAL
  Filled 2021-11-20 (×2): qty 1

## 2021-11-20 MED ORDER — MIRTAZAPINE 15 MG PO TABS
15.0000 mg | ORAL_TABLET | Freq: Every day | ORAL | Status: DC
  Administered 2021-11-20 – 2021-11-21 (×2): 15 mg via ORAL
  Filled 2021-11-20 (×4): qty 1

## 2021-11-20 MED ORDER — PANTOPRAZOLE SODIUM 40 MG PO TBEC
40.0000 mg | DELAYED_RELEASE_TABLET | Freq: Two times a day (BID) | ORAL | Status: DC
  Administered 2021-11-20 – 2021-11-22 (×4): 40 mg via ORAL
  Filled 2021-11-20 (×4): qty 1

## 2021-11-20 MED ORDER — LACTATED RINGERS IV SOLN: INTRAVENOUS | Status: AC

## 2021-11-20 MED ORDER — DONEPEZIL HCL 10 MG PO TABS
10.0000 mg | ORAL_TABLET | Freq: Every day | ORAL | Status: DC
  Administered 2021-11-20 – 2021-11-22 (×3): 10 mg via ORAL
  Filled 2021-11-20 (×4): qty 1

## 2021-11-20 MED ORDER — MEMANTINE HCL 10 MG PO TABS
10.0000 mg | ORAL_TABLET | Freq: Two times a day (BID) | ORAL | Status: DC
  Administered 2021-11-20 – 2021-11-22 (×5): 10 mg via ORAL
  Filled 2021-11-20 (×7): qty 1

## 2021-11-20 MED ORDER — ALLOPURINOL 300 MG PO TABS
300.0000 mg | ORAL_TABLET | Freq: Every day | ORAL | Status: DC
  Administered 2021-11-20 – 2021-11-22 (×3): 300 mg via ORAL
  Filled 2021-11-20 (×4): qty 1

## 2021-11-20 MED ORDER — MEGESTROL ACETATE 400 MG/10ML PO SUSP
200.0000 mg | Freq: Every day | ORAL | Status: DC
  Filled 2021-11-20 (×5): qty 5

## 2021-11-20 MED ORDER — SODIUM CHLORIDE 0.9 % IV SOLN
1.0000 g | INTRAVENOUS | Status: DC
  Administered 2021-11-20: 1 g via INTRAVENOUS
  Filled 2021-11-20 (×3): qty 1

## 2021-11-20 NOTE — ED Notes (Signed)
To mri 

## 2021-11-20 NOTE — ED Notes (Signed)
Pt checked and found to be clean and dry

## 2021-11-20 NOTE — Consult Note (Signed)
Reason for Consult/Chief Complaint: diverticulitis Consultant: Bonner Puna, MD  Susan [name] is an 81 y.o. female.   HPI: 64F presents after becoming unresponsive at her nursing facility, [facility]. Family was contacted at the time of the episode and son is at bedside who provides the majority of the history. He reports her period of unresponsiveness was brief and that "they checked something in her finger" after which she became responsive. R hemicolectomy for colon cancer 09/2021 by Dr. Johney Maine. Son reports patient has not quite recovered to baseline since surgery from an energy and nutritional intake standpoint. Reports 20# weight loss since surgery.   Past Medical History:  Diagnosis Date   Anemia    Arthritis    Blood transfusion without reported diagnosis    GERD (gastroesophageal reflux disease)    GI bleed    HLD (hyperlipidemia)    Hypertension    Hypothyroidism    Memory loss    Myelodysplasia    Vitamin D deficiency     Past Surgical History:  Procedure Laterality Date   BIOPSY  01/09/2021   Procedure: BIOPSY;  Surgeon: Milus Banister, MD;  Location: [facility];  Service: Endoscopy;;   BIOPSY  05/04/2021   Procedure: BIOPSY;  Surgeon: Ladene Artist, MD;  Location: [facility];  Service: Endoscopy;;   BIOPSY  09/29/2021   Procedure: BIOPSY;  Surgeon: Mauri Pole, MD;  Location: [facility];  Service: Endoscopy;;   CARPAL TUNNEL RELEASE Right    COLONOSCOPY N/A 09/29/2021   Procedure: COLONOSCOPY;  Surgeon: Mauri Pole, MD;  [facility]: [facility];  Service: Endoscopy;  Laterality: N/A;   DILATION AND CURETTAGE OF UTERUS     ENTEROSCOPY N/A 05/04/2021   Procedure: ENTEROSCOPY;  Surgeon: Ladene Artist, MD;  [facility];  Service: Endoscopy;  Laterality: N/A;   ENTEROSCOPY N/A 09/29/2021   Procedure: ENTEROSCOPY;  Surgeon: Mauri Pole, MD;  [facility];  Service: Endoscopy;  Laterality: N/A;    ESOPHAGOGASTRODUODENOSCOPY (EGD) WITH PROPOFOL N/A 01/09/2021   Procedure: ESOPHAGOGASTRODUODENOSCOPY (EGD) WITH PROPOFOL;  Surgeon: Milus Banister, MD;  [facility];  Service: Endoscopy;  Laterality: N/A;   HOT HEMOSTASIS N/A 05/04/2021   Procedure: HOT HEMOSTASIS (ARGON PLASMA COAGULATION/BICAP);  Surgeon: Ladene Artist, MD;  Location: [facility];  Service: Endoscopy;  Laterality: N/A;   INGUINAL HERNIA REPAIR     IR ANGIOGRAM FOLLOW UP STUDY  03/13/2021   IR ANGIOGRAM VISCERAL SELECTIVE  03/13/2021   IR EMBO ART  VEN HEMORR LYMPH EXTRAV  INC GUIDE ROADMAPPING  03/13/2021   IR US GUIDE VASC ACCESS RIGHT  03/13/2021   ROTATOR CUFF REPAIR Left    SUBMUCOSAL TATTOO INJECTION  05/04/2021   Procedure: SUBMUCOSAL TATTOO INJECTION;  Surgeon: Ladene Artist, MD;  [facility];  Service: Endoscopy;;   SUBMUCOSAL TATTOO INJECTION  09/29/2021   Procedure: SUBMUCOSAL TATTOO INJECTION;  Surgeon: Mauri Pole, MD;  [facility];  Service: Endoscopy;;   TRANSANAL HEMORRHOIDAL DEARTERIALIZATION N/A 03/31/2017   Procedure: TRANSANAL HEMORRHOIDAL DEARTERIALIZATION;  Surgeon: Leighton Ruff, MD;  Location: [facility];  Service: General;  Laterality: N/A;   UMBILICAL HERNIA REPAIR      Family History  Problem Relation Age of Onset   Stroke Father    Hypertension Father    Other Mother        died at early age   Stroke Sister    Coronary artery disease Brother    Hypertension Sister  Bladder Cancer Sister    Colon cancer Neg Hx    Stomach cancer Neg Hx     Social History:  reports that she quit smoking about 25 years ago. Her smoking use included cigarettes. She has never used smokeless tobacco. She reports current alcohol use. She reports that she does not use drugs.  Allergies:  Allergies  Allergen Reactions   Codeine Anaphylaxis   Penicillins Anaphylaxis   Bactrim Itching and Swelling   Clarithromycin Other (See Comments)    "Caused skin to peel off my  hand."   Flagyl [Metronidazole Hcl] Itching and Swelling   Food Other (See Comments)    EATS WHITE RICE ONLY (ALLERGIC REACTION TO YELLOW OR BROWN RICE THAT RESULTED IN HOSPITALIZATION)   Pineapple Itching and Swelling    SWELLING OF EYES   Rice (Diagnostic)     Other reaction(s): brown or yellow   Sulfa Antibiotics Itching and Swelling    Medications: I have reviewed the patient's current medications.  Results for orders placed or performed during the hospital encounter of 11/19/21 (from the past 48 hour(s))  Resp Panel by RT-PCR (Flu A&B, Covid) Nasopharyngeal Swab     Status: None   Collection Time: 11/19/21  5:33 PM   Specimen: Nasopharyngeal Swab; Nasopharyngeal(NP) swabs in vial transport medium  Result Value Ref Range   SARS Coronavirus 2 by RT PCR NEGATIVE NEGATIVE    Comment: (NOTE) SARS-CoV-2 target nucleic acids are NOT DETECTED.  The SARS-CoV-2 RNA is generally detectable in upper respiratory specimens during the acute phase of infection. The lowest concentration of SARS-CoV-2 viral copies this assay can detect is 138 copies/mL. A negative result does not preclude SARS-Cov-2 infection and should not be used as the sole basis for treatment or other patient management decisions. A negative result may occur with  improper specimen collection/handling, submission of specimen other than nasopharyngeal swab, presence of viral mutation(s) within the areas targeted by this assay, and inadequate number of viral copies(<138 copies/mL). A negative result must be combined with clinical observations, patient history, and epidemiological information. The expected result is Negative.  Fact Sheet for Patients:  EntrepreneurPulse.com.au  Fact Sheet for Healthcare Providers:  IncredibleEmployment.be  This test is no t yet approved or cleared by the [place] FDA and  has been authorized for detection and/or diagnosis of SARS-CoV-2 by FDA  under an Emergency Use Authorization (EUA). This EUA will remain  in effect (meaning this test can be used) for the duration of the COVID-19 declaration under Section 564(b)(1) of the Act, 21 U.S.C.section 360bbb-3(b)(1), unless the authorization is terminated  or revoked sooner.       Influenza A by PCR NEGATIVE NEGATIVE   Influenza B by PCR NEGATIVE NEGATIVE    Comment: (NOTE) The Xpert Xpress SARS-CoV-2/FLU/RSV plus assay is intended as an aid in the diagnosis of influenza from Nasopharyngeal swab specimens and should not be used as a sole basis for treatment. Nasal washings and aspirates are unacceptable for Xpert Xpress SARS-CoV-2/FLU/RSV testing.  Fact Sheet for Patients: EntrepreneurPulse.com.au  Fact Sheet for Healthcare Providers: IncredibleEmployment.be  This test is not yet approved or cleared by the [place] FDA and has been authorized for detection and/or diagnosis of SARS-CoV-2 by FDA under an Emergency Use Authorization (EUA). This EUA will remain in effect (meaning this test can be used) for the duration of the COVID-19 declaration under Section 564(b)(1) of the Act, 21 U.S.C. section 360bbb-3(b)(1), unless the authorization is terminated or revoked.  Performed at [facility], [place] [address] [place], [place] [address]   Comprehensive metabolic panel     Status: Abnormal   Collection Time: 11/19/21  6:35 PM  Result Value Ref Range   Sodium 141 135 - 145 mmol/L   Potassium 3.7 3.5 - 5.1 mmol/L   Chloride 108 98 - 111 mmol/L   CO2 21 (L) 22 - 32 mmol/L   Glucose, Bld 91 70 - 99 mg/dL    Comment: Glucose reference range applies only to samples taken after fasting for at least 8 hours.   BUN 27 (H) 8 - 23 mg/dL   Creatinine, Ser 2.72 (H) 0.44 - 1.00 mg/dL   Calcium 9.6 8.9 - 10.3 mg/dL   Total Protein 7.8 6.5 - 8.1 g/dL   Albumin 3.2 (L) 3.5 - 5.0 g/dL   AST 16 15 - 41 U/L   ALT 7 0 - 44 U/L   Alkaline  Phosphatase 116 38 - 126 U/L   Total Bilirubin 0.5 0.3 - 1.2 mg/dL   GFR, Estimated 17 (L) >60 mL/min    Comment: (NOTE) Calculated using the CKD-EPI Creatinine Equation (2021)    Anion gap 12 5 - 15    Comment: Performed at [facility] [address], [place], [place] [address]  CBC with Differential     Status: Abnormal   Collection Time: 11/19/21  6:35 PM  Result Value Ref Range   WBC 6.9 4.0 - 10.5 K/uL   RBC 3.65 (L) 3.87 - 5.11 MIL/uL   Hemoglobin 9.6 (L) 12.0 - 15.0 g/dL   HCT 30.0 (L) 36.0 - 46.0 %   MCV 82.2 80.0 - 100.0 fL   MCH 26.3 26.0 - 34.0 pg   MCHC 32.0 30.0 - 36.0 g/dL   RDW 17.1 (H) 11.5 - 15.5 %   Platelets PLATELET CLUMPS NOTED ON SMEAR, UNABLE TO ESTIMATE 150 - 400 K/uL   nRBC 0.0 0.0 - 0.2 %   Neutrophils Relative % 74 %   Neutro Abs 5.0 1.7 - 7.7 K/uL   Lymphocytes Relative 22 %   Lymphs Abs 1.5 0.7 - 4.0 K/uL   Monocytes Relative 4 %   Monocytes Absolute 0.3 0.1 - 1.0 K/uL   Eosinophils Relative 0 %   Eosinophils Absolute 0.0 0.0 - 0.5 K/uL   Basophils Relative 0 %   Basophils Absolute 0.0 0.0 - 0.1 K/uL   Immature Granulocytes 0 %   Abs Immature Granulocytes 0.02 0.00 - 0.07 K/uL    Comment: Performed at [facility] [address], [place], [place] [address]  Lactic acid, plasma     Status: None   Collection Time: 11/19/21  6:35 PM  Result Value Ref Range   Lactic Acid, Venous 1.3 0.5 - 1.9 mmol/L    Comment: Performed at [facility] [address] [place], [place] [address]  Ammonia     Status: None   Collection Time: 11/19/21  6:35 PM  Result Value Ref Range   Ammonia 14 9 - 35 umol/L    Comment: Performed at [facility], [place] [address] [place], [place] [address]  Urinalysis, Routine w reflex microscopic Urine, In & Out Cath     Status: Abnormal   Collection Time: 11/19/21  9:09 PM  Result Value Ref Range   Color, Urine YELLOW YELLOW   APPearance CLEAR CLEAR   Specific Gravity, Urine 1.020 1.005 - 1.030    pH 6.0 5.0 - 8.0   Glucose,  UA NEGATIVE NEGATIVE mg/dL   Hgb urine dipstick NEGATIVE NEGATIVE   Bilirubin Urine SMALL (A) NEGATIVE   Ketones, ur NEGATIVE NEGATIVE mg/dL   Protein, ur 100 (A) NEGATIVE mg/dL   Nitrite NEGATIVE NEGATIVE   Leukocytes,Ua SMALL (A) NEGATIVE    Comment: Performed at [facility] [address] [place], [place] [address]  Urinalysis, Microscopic (reflex)     Status: Abnormal   Collection Time: 11/19/21  9:09 PM  Result Value Ref Range   RBC / HPF 0-5 0 - 5 RBC/hpf   WBC, UA 6-10 0 - 5 WBC/hpf   Bacteria, UA MANY (A) NONE SEEN   Squamous Epithelial / LPF 0-5 0 - 5   Urine-Other LESS THAN 10 mL OF URINE SUBMITTED     Comment: MICROSCOPIC EXAM PERFORMED ON UNCONCENTRATED URINE Performed at [facility], [place] [address], [place], [place] [address]   I-Stat venous blood gas, ED     Status: Abnormal   Collection Time: 11/19/21  9:33 PM  Result Value Ref Range   pH, Ven 7.373 7.25 - 7.43   pCO2, Ven 40.6 (L) 44 - 60 mmHg   pO2, Ven 70 (H) 32 - 45 mmHg   Bicarbonate 23.6 20.0 - 28.0 mmol/L   TCO2 25 22 - 32 mmol/L   O2 Saturation 93 %   Acid-base deficit 2.0 0.0 - 2.0 mmol/L   Sodium 145 135 - 145 mmol/L   Potassium 3.6 3.5 - 5.1 mmol/L   Calcium, Ion 1.24 1.15 - 1.40 mmol/L   HCT 28.0 (L) 36.0 - 46.0 %   Hemoglobin 9.5 (L) 12.0 - 15.0 g/dL   Sample type VENOUS   Comprehensive metabolic panel     Status: Abnormal   Collection Time: 11/20/21  2:49 AM  Result Value Ref Range   Sodium 140 135 - 145 mmol/L   Potassium 3.8 3.5 - 5.1 mmol/L   Chloride 109 98 - 111 mmol/L   CO2 22 22 - 32 mmol/L   Glucose, Bld 86 70 - 99 mg/dL    Comment: Glucose reference range applies only to samples taken after fasting for at least 8 hours.   BUN 26 (H) 8 - 23 mg/dL   Creatinine, Ser 2.40 (H) 0.44 - 1.00 mg/dL   Calcium 9.0 8.9 - 10.3 mg/dL   Total Protein 6.7 6.5 - 8.1 g/dL   Albumin 2.8 (L) 3.5 - 5.0 g/dL   AST 14 (L) 15 - 41 U/L   ALT 6 0 - 44 U/L    Alkaline Phosphatase 103 38 - 126 U/L   Total Bilirubin 0.4 0.3 - 1.2 mg/dL   GFR, Estimated 20 (L) >60 mL/min    Comment: (NOTE) Calculated using the CKD-EPI Creatinine Equation (2021)    Anion gap 9 5 - 15    Comment: Performed at [facility] [address], [place], [place] [address]  CBC WITH DIFFERENTIAL     Status: Abnormal   Collection Time: 11/20/21  2:49 AM  Result Value Ref Range   WBC 5.5 4.0 - 10.5 K/uL   RBC 3.24 (L) 3.87 - 5.11 MIL/uL   Hemoglobin 8.7 (L) 12.0 - 15.0 g/dL   HCT 26.6 (L) 36.0 - 46.0 %   MCV 82.1 80.0 - 100.0 fL   MCH 26.9 26.0 - 34.0 pg   MCHC 32.7 30.0 - 36.0 g/dL   RDW 17.2 (H) 11.5 - 15.5 %   Platelets 101 (L) 150 - 400 K/uL  Comment: Immature Platelet Fraction may be clinically indicated, consider ordering this additional test [id number] REPEATED TO VERIFY PLATELET COUNT CONFIRMED BY SMEAR    nRBC 0.0 0.0 - 0.2 %   Neutrophils Relative % 52 %   Neutro Abs 2.8 1.7 - 7.7 K/uL   Lymphocytes Relative 41 %   Lymphs Abs 2.2 0.7 - 4.0 K/uL   Monocytes Relative 7 %   Monocytes Absolute 0.4 0.1 - 1.0 K/uL   Eosinophils Relative 0 %   Eosinophils Absolute 0.0 0.0 - 0.5 K/uL   Basophils Relative 0 %   Basophils Absolute 0.0 0.0 - 0.1 K/uL   WBC Morphology MORPHOLOGY UNREMARKABLE    Smear Review MORPHOLOGY UNREMARKABLE    Immature Granulocytes 0 %   Abs Immature Granulocytes 0.02 0.00 - 0.07 K/uL   Target Cells PRESENT     Comment: Performed at [facility] [address] [place], [place] [address]    CT Head Wo Contrast  Result Date: 11/19/2021 CLINICAL DATA:  Delirium EXAM: CT HEAD WITHOUT CONTRAST TECHNIQUE: Contiguous axial images were obtained from the base of the skull through the vertex without intravenous contrast. RADIATION DOSE REDUCTION: This exam was performed according to the departmental dose-optimization program which includes automated exposure control, adjustment of the mA and/or kV according to patient size and/or  use of iterative reconstruction technique. COMPARISON:  CT head dated August 06, 2021 FINDINGS: Brain: No evidence of acute infarction, hemorrhage, hydrocephalus, extra-axial collection or mass lesion/mass effect. Moderate cerebral atrophy and advanced microvascular ischemic changes of the white matter. Vascular: No hyperdense vessel or unexpected calcification. Skull: Normal. Negative for fracture or focal lesion. Sinuses/Orbits: No acute finding. Other: None. IMPRESSION: 1. No acute intracranial abnormality. 2. Advanced cerebral atrophy and microvascular ischemic changes of the white matter, unchanged. Electronically Signed   By: Keane Police D.O.   On: 11/19/2021 18:32   MR BRAIN WO CONTRAST  Result Date: 11/20/2021 CLINICAL DATA:  81 year old female with confusion, lethargy. EXAM: MRI HEAD WITHOUT CONTRAST TECHNIQUE: Multiplanar, multiecho pulse sequences of the brain and surrounding structures were obtained without intravenous contrast. COMPARISON:  Head CT 11/19/2021. Brain MRI 06/04/2020. FINDINGS: Brain: No restricted diffusion to suggest acute infarction. No midline shift, mass effect, evidence of mass lesion, ventriculomegaly, extra-axial collection or acute intracranial hemorrhage. Cervicomedullary junction and pituitary are within normal limits. Chronic widespread Patchy and confluent white matter T2 and FLAIR hyperintensity. Bilateral deep white matter capsule involvement. No significant change since 2021. Small to intermediate number of chronic microhemorrhages, widely scattered in the right hemisphere, right temporal lobe most affected. Lesser involvement of the left hemisphere. Deep gray matter nuclei, brainstem and cerebellum appear spared. No cortical encephalomalacia. Mild T2 heterogeneity in the basal ganglia greater on the left. Thalamus, brainstem and cerebellum within normal limits. Vascular: Major intracranial vascular flow voids are stable since 2021. Dominant right vertebral artery.  Mild generalized intracranial artery tortuosity. Skull and upper cervical spine: Partially visible cervical spine degeneration not significantly changed. Visualized bone marrow signal is within normal limits. Sinuses/Orbits: Postoperative changes to both globes. Paranasal sinuses and mastoids are stable and well aerated. Other: Grossly normal visible internal auditory structures visible scalp and face appear negative. IMPRESSION: 1. No acute intracranial abnormality. 2. 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 Intermediate number of chronic micro-hemorrhages scattered in the brain, although mostly the right hemisphere. Pattern is not strongly suggestive of amyloid angiopathy. Electronically Signed   By: Genevie Ann M.D.   On: 11/20/2021 07:20  DG Chest Portable 1 View  Result Date: 11/19/2021 CLINICAL DATA:  Altered mental status. EXAM: PORTABLE CHEST 1 VIEW COMPARISON:  Chest two views 06/21/2021 FINDINGS: Cardiac silhouette is again at the upper limits of normal size. There is again a tortuous thoracic aorta. Mild calcification is again seen within aortic arch. The lungs are clear. No pulmonary edema, pleural effusion, or pneumothorax. There is a screw anchor overlying the superolateral left humeral head. IMPRESSION: No acute lung process. Electronically Signed   By: Yvonne Kendall M.D.   On: 11/19/2021 17:14   EEG adult  Result Date: 11/20/2021 Gwinda Maine, MD     11/20/2021 11:46 AM EEG Inpatient Electroencephalogram Report Clinical Neurophysiology Services Patient Name: Lael Juncaj Age: 81 y.o. Sex: female MRN: [id number] Date: 11/20/2021 Location: [facility] ED 9 Neurologist:  Lynnae Sandhoff, MD Clinical Information Helyne Genther Turner is a 81 y.o. female with an episode of unresponsiveness and lethargy. History is primarily from the son who is currently at the bedside. He was on his way to go see her at the facility and they called and told him that she was "unresponsive". By the time he  got there she seemed to be responsive but seemed lethargic and sleepy. She seems weaker than normal but her mental status to him is normal for her. She has a history of chronic dementia.  She has been getting weaker and losing weight ever since getting into the rehab facility after her colon surgery for colon cancer. Last week she received a unit of blood for a blood transfusion. No known illness and she seemed fine when family visited her yesterday, though the son was not there. No recent medication changes according to the son. Patient states she has a little bit of a headache when asked but otherwise states she feels good. Medications Prior to Admission medications  Medication Sig Start Date End Date Taking? Authorizing Provider acetaminophen (TYLENOL) 500 MG tablet Take 500 mg by mouth 3 (three) times daily.   Yes [provider] acetaminophen (TYLENOL) 500 MG tablet Take 500 mg by mouth every 6 (six) hours as needed for moderate pain.   Yes [provider] allopurinol (ZYLOPRIM) 300 MG tablet Take 300 mg by mouth daily. 07/20/20  Yes [provider] donepezil (ARICEPT) 10 MG tablet Take 10 mg by mouth daily. 07/20/20  Yes [provider] famotidine (PEPCID) 20 MG tablet Take 20 mg by mouth 2 (two) times daily. 03/08/21  Yes [provider] gabapentin (NEURONTIN) 100 MG capsule Take 2 capsules (200 mg total) by mouth 3 (three) times daily. 10/08/21  Yes [place] Donnamarie Poag, DO levothyroxine (SYNTHROID) 75 MCG tablet Take 75 mcg by mouth every morning. 12/18/20  Yes [provider] megestrol (MEGACE) 40 MG/ML suspension Take 200 mg by mouth See admin instructions. Qd x 4 weeks   Yes [provider] memantine (NAMENDA) 10 MG tablet Take 1 tablet (10 mg total) by mouth 2 (two) times daily. 11/30/20  Yes [name], NP mirtazapine (REMERON) 15 MG tablet Take 15 mg by mouth at bedtime. 07/20/20  Yes [provider] montelukast (SINGULAIR) 10 MG tablet  Take 10 mg by mouth at bedtime.   Yes [provider] Nutritional Supplements (BOOST PO) Take 237 mLs by mouth in the morning and at bedtime. vanilla   Yes [provider] pantoprazole (PROTONIX) 40 MG tablet Take 1 tablet (40 mg total) by mouth 2 (two) times daily. 10/08/21  Yes [place] Eric J, DO polycarbophil (FIBERCON) [phone]  MG tablet Take 1 tablet (625 mg total) by mouth 2 (two) times daily. 10/08/21  Yes [place] Eric J, DO polyethylene glycol powder (GLYCOLAX/MIRALAX) 17 GM/SCOOP powder Take 17 g by mouth daily.   Yes [provider] Ertapenem Sodium (INVANZ IJ) Inject 1 g as directed See admin instructions. Hs x 3 days Patient not taking: Reported on 11/19/2021    [provider] ferrous sulfate 325 (65 FE) MG tablet Take 1 tablet (325 mg total) by mouth daily with breakfast. Patient not taking: Reported on 11/19/2021 10/09/21   [place] Donnamarie Poag, DO SODIUM CHLORIDE IJ Inject into the skin See admin instructions. Sodium chloride 0.9 % 8ml/hr sq every shift for a total of 3 liters. Patient not taking: Reported on 11/19/2021    [provider] Recording Conditions This is a routine less than one hour EEG recording.  The EEG was performed utilizing standard international 10-20 system of electrode placement, with additional channels monitored for eye movement.  One channel electrocardiogram was monitored.  Data were obtained, stored, and interpreted according to ACNS guidelines (J Clin Neurophysiol 2006;23(2):85-183) utilizing referential montage recording, with reformatting to longitudinal, transverse bipolar, and referential montages as necessary for interpretation, along with digital/automated EEG analysis.  Patient tolerated entire procedure well.  Photic stimulation and hyperventilation were utilized as activation procedures unless otherwise specified below. EEG Description Background Activity: Posterior dominant rhythm (PDR) 8-9Hz  occasionally seen. The predominant  background consisted of moderate voltage arrhythmic theta and delta occasionally intermixed with fast activity. Occasional brief periods with attenuation of voltage. Paroxysmal Abnormalities: None Drowsiness/Sleep: Elements of normal sleep architecture, including sleep spindles and vertex waves, were appreciated. State changes were appreciated. Stimulation: Not performed ECG Recording: EKG did not show overt abnormality Recording Quality: Electrode and EMG artifact frequently seen. Motion, and eye movement artifacts were occasionally noted. EEG Interpretation Ictal Events: None 2.   Interictal Abnormalities: Generalized slowing, occasional brief periods of attenuation. 3.   Non-Epileptic Events: None Clinical Correlation No seizures were recorded.  No interictal epileptiform abnormalities were recorded.  The absence of epileptiform abnormalities does not preclude a clinical diagnosis of seizures.   EEG background findings are suggestive dysfunction of deep white matter and a mild to moderate encephalopathy but are etiologically nonspecific. Electronically signed by: Lynnae Sandhoff, MD Page: [phone] 11/20/2021, 11:06 AM   CT RENAL STONE STUDY  Result Date: 11/20/2021 CLINICAL DATA:  Flank pain, kidney stone suspected. EXAM: CT ABDOMEN AND PELVIS WITHOUT CONTRAST TECHNIQUE: Multidetector CT imaging of the abdomen and pelvis was performed following the standard protocol without IV contrast. RADIATION DOSE REDUCTION: This exam was performed according to the departmental dose-optimization program which includes automated exposure control, adjustment of the mA and/or kV according to patient size and/or use of iterative reconstruction technique. COMPARISON:  08/06/2021. FINDINGS: Lower chest: The heart is normal in size and there is no pericardial effusion. Multi-vessel coronary artery calcifications are noted. Atelectasis is noted at the lung bases. Hepatobiliary: No focal liver abnormality. The gallbladder is  without stones. No biliary ductal dilatation. Pancreas: Unremarkable. No pancreatic ductal dilatation or surrounding inflammatory changes. Spleen: Normal in size without focal abnormality. Adrenals/Urinary Tract: The adrenal glands are within normal limits. Renal cysts are present bilaterally. A nonobstructive calculus is present on the right. No obstructive uropathy bilaterally. Air is present in the urinary bladder which may be iatrogenic. Stomach/Bowel: The stomach is within normal limits. No bowel obstruction, free air, or pneumatosis. Scattered diverticula are noted along the colon. There is mild pericolonic fat stranding  involving the descending colon with associated bowel wall thickening. Right hemicolectomy changes are noted. Vascular/Lymphatic: Aortic atherosclerosis. No enlarged abdominal or pelvic lymph nodes. Reproductive: Uterus and bilateral adnexa are unremarkable. Other: Small fat containing periumbilical hernia.  No ascites. Musculoskeletal: Degenerative changes are present in the thoracolumbar spine. There is a stable compression deformity at L1. A mixed lytic and sclerotic region is noted in the left femoral head, possible avascular necrosis. IMPRESSION: 1. Right renal calculus and bilateral renal cysts. No obstructive uropathy bilaterally. 2. Colonic diverticulosis. There is bowel wall thickening with mild pericolonic fat stranding involving the descending colon, possible diverticulitis. 3. Aortic atherosclerosis. Electronically Signed   By: Brett Fairy M.D.   On: 11/20/2021 01:23    ROS 10 point review of systems is negative except as listed above in HPI.   Physical Exam Blood pressure (!) 149/89, pulse 65, temperature 98.1 F (36.7 C), resp. rate 17, height 5\' 2"  (1.575 m), weight 45.8 kg, SpO2 100 %. Constitutional: well-developed, well-nourished HEENT: pupils equal, round, reactive to light, 86mm b/l, moist conjunctiva, external inspection of ears and nose normal, hearing  intact Oropharynx: normal oropharyngeal mucosa, poor dentition Neck: no thyromegaly, trachea midline, no midline cervical tenderness to palpation Chest: breath sounds equal bilaterally, normal respiratory effort, no midline or lateral chest wall tenderness to palpation/deformity Abdomen: soft, NT, no bruising, no hepatosplenomegaly, well healed surgical scar GU: normal female genitalia  Back: no wounds, no thoracic/lumbar spine tenderness to palpation, no thoracic/lumbar spine stepoffs Rectal: deferred Extremities: 2+ radial and pedal pulses bilaterally, intact motor and sensation bilateral UE and LE, no peripheral edema MSK: unable to assess gait/station, no clubbing/cyanosis of fingers/toes, normal ROM of all four extremities Skin: warm, dry, no rashes Psych:  poor memory      Assessment/Plan: 66F with encephalopathy of unclear etiology. Mild diverticulitis on CT scan and UA c/w UTI. Normal WBC. Recommend continued w/u by primary team, urine culture, and empiric abx. Consider 7d course of coverage inclusive of anaerobic agent in addition to treatment of UTI.    Jesusita Oka, MD [facility]

## 2021-11-20 NOTE — Progress Notes (Signed)
EEG complete - results pending 

## 2021-11-20 NOTE — ED Notes (Signed)
To ct

## 2021-11-20 NOTE — Progress Notes (Signed)
Pharmacy Antibiotic Note  Susan [name] is a 81 y.o. female admitted on 11/19/2021 with UTI.  Pharmacy has been consulted for aztreonam dosing.  Plan: Aztreonam 1g IV Q24H; adjust if renal function improves.  Height: 5\' 2"  (157.5 cm) Weight: 45.8 kg (101 lb) IBW/kg (Calculated) : 50.1  Temp (24hrs), Avg:98.3 F (36.8 C), Min:97.8 F (36.6 C), Max:98.9 F (37.2 C)  Recent Labs  Lab 11/19/21 1835  WBC 6.9  CREATININE 2.72*  LATICACIDVEN 1.3    Estimated Creatinine Clearance: 11.5 mL/min (A) (by C-G formula based on SCr of 2.72 mg/dL (H)).    Allergies  Allergen Reactions   Codeine Anaphylaxis   Penicillins Anaphylaxis   Bactrim Itching and Swelling   Clarithromycin Other (See Comments)    "Caused skin to peel off my hand."   Flagyl [Metronidazole Hcl] Itching and Swelling   Food Other (See Comments)    EATS WHITE RICE ONLY (ALLERGIC REACTION TO YELLOW OR BROWN RICE THAT RESULTED IN HOSPITALIZATION)   Pineapple Itching and Swelling    SWELLING OF EYES   Rice (Diagnostic)     Other reaction(s): brown or yellow   Sulfa Antibiotics Itching and Swelling    Thank you for allowing pharmacy to be a part of this patients care.  Wynona Neat, PharmD, BCPS  11/20/2021 12:52 AM

## 2021-11-20 NOTE — ED Notes (Signed)
Pts bed changed incontinent of urine  the pt remains confused  asking for family  she does not believe that she is in the hospital

## 2021-11-20 NOTE — Progress Notes (Signed)
PROGRESS NOTE  Brief Narrative: Susan [name] is an 81 y.o. female with a history of dementia, colon CA s/p ascending colon resection and anastomosis 10/01/2021, anemia, thrombocytopenia, and hypothyroidism who presented to the ED 2/17 after an unresponsive episode, described as lethargy, at her [facility]. She had been eating and drinking less over the past few days, had gabapentin dose recently increased, and was found to have AKI on arrival to the ED with creatinine 2.7 from previous 1.8 and suspected baseline of 1.4. Urinalysis was suggestive of infection, WBC normal, afebrile. Hgb improved from prior having had a transfusion the previous week. CT head nonacute, subsequent MRI brain nonacute, and EEG with nonspecific dysfunction without seizure activity. IV fluids and antibiotics were started and admission requested. Due to abdominal tenderness on exam, CT abd w/o contrast was performed and showed thickening of the descending colon wall with adjacent fat stranding suggestive of diverticulitis.   Subjective: Knows she's in the hospital but thinks it's "to get service." Denies any pain and specifically denies any feelings of illness, fever, chills, abdominal pain, nausea. Son updated by phone.  Objective: BP 136/77    Pulse 63    Temp 98.1 F (36.7 C)    Resp 11    Ht 5\' 2"  (1.575 m)    Wt 45.8 kg    SpO2 100%    BMI 18.47 kg/m   Gen: Elderly thin female in no acute distress Pulm: Clear and nonlabored on room air  CV: RRR, no murmur, no JVD, no edema GI: Soft, mildly diffusely tender to palpation including suprapubic area, without open wound, ND, +BS  Neuro: Alert and incompletely oriented. No focal deficits. Skin: No rashes, lesions or ulcers  Assessment & Plan: AKI on stage IIIb CKD: Slightly improved but not near baseline, thought to be near 1.4 (at last discharge). No evidence of obstructive uropathy on CT. No RBCs on UA micro, 100 protein.  - Continue IVF. Has not demonstrated ability to take  adequate orally to maintain hydration. - Avoid nephrotoxins. - Trend BMP  UTI: Suspected based on urinalysis findings and some suprapubic tenderness on my exam. - Continue aztreonam - Monitor urine culture.   Possible diverticulitis: Mild with minimal specific tenderness on exam, no bloody BM/diarrhea, no fever or leukocytosis.  - Antibiotics as above, advance diet unless not tolerant. Does not require IV pain medications, but will support with IVF.  Acute encephalopathy on chronic dementia: Lethargy suggestive of metabolic derangement, perhaps gabapentin toxicity due to prerenal azotemia/dehydration. Infectious etiology also possibly contributing. Right-side predominant microhemorrhages on MRI without acute findings. 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 - Outpatient follow up, no complication at operative site on CT.  Chronic blood loss anemia, thrombocytopenia: Improved/stable  Hypothyroidism:  - Check TSH, last was 21 with normal T4, low T3. Will recheck these and continue synthroid.    Patrecia Pour, MD Pager on amion 11/20/2021, 2:48 PM

## 2021-11-20 NOTE — H&P (Signed)
History and Physical    Aarti Mankowski Palma ZCH:[id number] DOB: [birth year] DOA: 11/19/2021  PCP: Sonia Side., [name]  Patient coming from: Skilled nursing facility.  History obtained from patient's son.  Patient has dementia.  Chief Complaint: Unresponsive episode and confusion.  HPI: Susan [name] is a 81 y.o. female with history of colon cancer status post recent surgery on October 01, 2021 when patient underwent proximal colectomy being followed by Dr. Alen Blew oncologist was brought to the ER after patient had an unresponsive episode following which patient appeared confused.  As per patient's son over the last few days patient has not been eating well but did not have any nausea vomiting or diarrhea did not have any chest pain or shortness of breath.  It is not known exactly how long patient became unresponsive by the time patient's son reached skilled nursing facility patient was responsive.  Reviewing patient's medications with pharmacist it looks like patient's gabapentin dosing has been increased over the last 1 month.  Patient also had received Invanz last dose of which was on November 12, 2021.  Patient was taken to the ER last week for low hemoglobin around 7 received 1 unit of PRBC transfusion.  ED Course: In the ER patient appeared lethargic and mildly confused.  CT head did not show anything acute.  Patient labs show that patient's creatinine is increased from 1.8 on February 10 and it is around 2.7.  UA shows possibility of infection.  On exam patient's abdomen appears mildly tender for which I ordered CT abdomen pelvis which shows possibility of diverticulitis.  Given the acute on chronic kidney disease patient was given 1 L fluid bolus started on empiric antibiotics admitted for further work-up.  Review of Systems: As per HPI, rest all negative.   Past Medical History:  Diagnosis Date   Anemia    Arthritis    Blood transfusion without reported diagnosis    GERD  (gastroesophageal reflux disease)    GI bleed    HLD (hyperlipidemia)    Hypertension    Hypothyroidism    Memory loss    Myelodysplasia    Vitamin D deficiency     Past Surgical History:  Procedure Laterality Date   BIOPSY  01/09/2021   Procedure: BIOPSY;  Surgeon: Milus Banister, MD;  Location: [facility];  Service: Endoscopy;;   BIOPSY  05/04/2021   Procedure: BIOPSY;  Surgeon: Ladene Artist, MD;  Location: [facility];  Service: Endoscopy;;   BIOPSY  09/29/2021   Procedure: BIOPSY;  Surgeon: Mauri Pole, MD;  Location: [facility];  Service: Endoscopy;;   CARPAL TUNNEL RELEASE Right    COLONOSCOPY N/A 09/29/2021   Procedure: COLONOSCOPY;  Surgeon: Mauri Pole, MD;  Location: [facility];  Service: Endoscopy;  Laterality: N/A;   DILATION AND CURETTAGE OF UTERUS     ENTEROSCOPY N/A 05/04/2021   Procedure: ENTEROSCOPY;  Surgeon: Ladene Artist, MD;  Location: [facility];  Service: Endoscopy;  Laterality: N/A;   ENTEROSCOPY N/A 09/29/2021   Procedure: ENTEROSCOPY;  Surgeon: Mauri Pole, MD;  Location: [facility];  Service: Endoscopy;  Laterality: N/A;   ESOPHAGOGASTRODUODENOSCOPY (EGD) WITH PROPOFOL N/A 01/09/2021   Procedure: ESOPHAGOGASTRODUODENOSCOPY (EGD) WITH PROPOFOL;  Surgeon: Milus Banister, MD;  Location: [facility];  Service: Endoscopy;  Laterality: N/A;   HOT HEMOSTASIS N/A 05/04/2021   Procedure: HOT HEMOSTASIS (ARGON PLASMA COAGULATION/BICAP);  Surgeon: Ladene Artist, MD;  Location: [facility];  Service: Endoscopy;  Laterality: N/A;  INGUINAL HERNIA REPAIR     IR ANGIOGRAM FOLLOW UP STUDY  03/13/2021   IR ANGIOGRAM VISCERAL SELECTIVE  03/13/2021   IR EMBO ART  VEN HEMORR LYMPH EXTRAV  INC GUIDE ROADMAPPING  03/13/2021   IR US GUIDE VASC ACCESS RIGHT  03/13/2021   ROTATOR CUFF REPAIR Left    SUBMUCOSAL TATTOO INJECTION  05/04/2021   Procedure: SUBMUCOSAL TATTOO INJECTION;  Surgeon: Ladene Artist, MD;  Location: [facility];   Service: Endoscopy;;   SUBMUCOSAL TATTOO INJECTION  09/29/2021   Procedure: SUBMUCOSAL TATTOO INJECTION;  Surgeon: Mauri Pole, MD;  Location: [facility];  Service: Endoscopy;;   TRANSANAL HEMORRHOIDAL DEARTERIALIZATION N/A 03/31/2017   Procedure: TRANSANAL HEMORRHOIDAL DEARTERIALIZATION;  Surgeon: Leighton Ruff, MD;  Location: [facility];  Service: General;  Laterality: N/A;   [place]       reports that she quit smoking about 25 years ago. Her smoking use included cigarettes. She has never used smokeless tobacco. She reports current alcohol use. She reports that she does not use drugs.  Allergies  Allergen Reactions   Codeine Anaphylaxis   Penicillins Anaphylaxis   Bactrim Itching and Swelling   Clarithromycin Other (See Comments)    "Caused skin to peel off my hand."   Flagyl [Metronidazole Hcl] Itching and Swelling   Food Other (See Comments)    EATS WHITE RICE ONLY (ALLERGIC REACTION TO YELLOW OR BROWN RICE THAT RESULTED IN HOSPITALIZATION)   Pineapple Itching and Swelling    SWELLING OF EYES   Rice (Diagnostic)     Other reaction(s): brown or yellow   Sulfa Antibiotics Itching and Swelling    Family History  Problem Relation Age of Onset   Stroke Father    Hypertension Father    Other Mother        died at early age   Stroke Sister    Coronary artery disease Brother    Hypertension Sister    Bladder Cancer Sister    Colon cancer Neg Hx    Stomach cancer Neg Hx     Prior to Admission medications   Medication Sig Start Date End Date Taking? Authorizing Provider  acetaminophen (TYLENOL) 500 MG tablet Take 500 mg by mouth 3 (three) times daily.   Yes [provider]  acetaminophen (TYLENOL) 500 MG tablet Take 500 mg by mouth every 6 (six) hours as needed for moderate pain.   Yes [provider]  allopurinol (ZYLOPRIM) 300 MG tablet Take 300 mg by mouth daily. 07/20/20  Yes [provider]  donepezil (ARICEPT) 10 MG tablet  Take 10 mg by mouth daily. 07/20/20  Yes [provider]  famotidine (PEPCID) 20 MG tablet Take 20 mg by mouth 2 (two) times daily. 03/08/21  Yes [provider]  gabapentin (NEURONTIN) 100 MG capsule Take 2 capsules (200 mg total) by mouth 3 (three) times daily. 10/08/21  Yes [place] Donnamarie Poag, DO  levothyroxine (SYNTHROID) 75 MCG tablet Take 75 mcg by mouth every morning. 12/18/20  Yes [provider]  megestrol (MEGACE) 40 MG/ML suspension Take 200 mg by mouth See admin instructions. Qd x 4 weeks   Yes [provider]  memantine (NAMENDA) 10 MG tablet Take 1 tablet (10 mg total) by mouth 2 (two) times daily. 11/30/20  Yes [name], NP  mirtazapine (REMERON) 15 MG tablet Take 15 mg by mouth at bedtime. 07/20/20  Yes [provider]  montelukast (SINGULAIR) 10 MG tablet Take 10 mg by mouth at bedtime.  Yes [provider]  Nutritional Supplements (BOOST PO) Take 237 mLs by mouth in the morning and at bedtime. vanilla   Yes [provider]  pantoprazole (PROTONIX) 40 MG tablet Take 1 tablet (40 mg total) by mouth 2 (two) times daily. 10/08/21  Yes [place] Eric J, DO  polycarbophil (FIBERCON) 625 MG tablet Take 1 tablet (625 mg total) by mouth 2 (two) times daily. 10/08/21  Yes [place] Eric J, DO  polyethylene glycol powder (GLYCOLAX/MIRALAX) 17 GM/SCOOP powder Take 17 g by mouth daily.   Yes [provider]  Ertapenem Sodium (INVANZ IJ) Inject 1 g as directed See admin instructions. Hs x 3 days Patient not taking: Reported on 11/19/2021    [provider]  ferrous sulfate 325 (65 FE) MG tablet Take 1 tablet (325 mg total) by mouth daily with breakfast. Patient not taking: Reported on 11/19/2021 10/09/21   [place] Donnamarie Poag, DO  SODIUM CHLORIDE IJ Inject into the skin See admin instructions. Sodium chloride 0.9 % 70ml/hr sq every shift for a total of 3 liters. Patient not taking: Reported on 11/19/2021    [provider]     Physical Exam: Constitutional: Moderately built and nourished. Vitals:   11/19/21 1949 11/19/21 2030 11/19/21 2115 11/19/21 2342  BP: 125/77 108/82 127/81   Pulse: 76 75 (!) 102   Resp: 20 18 17    Temp:    98.1 F (36.7 C)  TempSrc:      SpO2: 97% 100% 100%   Weight:      Height:       Eyes: Anicteric no pallor. ENMT: No discharge from the ears eyes nose and mouth. Neck: No mass felt.  No neck rigidity. Respiratory: No rhonchi or crepitations. Cardiovascular: S1-S2 heard. Abdomen: Mild left upper quadrant tenderness.  No guarding or rigidity. Musculoskeletal: No edema. Skin: No rash. Neurologic: Alert awake oriented to name and place.  Moving all extremities. Psychiatric: Oriented to name and place.   Labs on Admission: I have personally reviewed following labs and imaging studies  CBC: Recent Labs  Lab 11/19/21 1835 11/19/21 2133  WBC 6.9  --   NEUTROABS 5.0  --   HGB 9.6* 9.5*  HCT 30.0* 28.0*  MCV 82.2  --   PLT PLATELET CLUMPS NOTED ON SMEAR, UNABLE TO ESTIMATE  --    Basic Metabolic Panel: Recent Labs  Lab 11/19/21 1835 11/19/21 2133  NA 141 145  K 3.7 3.6  CL 108  --   CO2 21*  --   GLUCOSE 91  --   BUN 27*  --   CREATININE 2.72*  --   CALCIUM 9.6  --    GFR: Estimated Creatinine Clearance: 11.5 mL/min (A) (by C-G formula based on SCr of 2.72 mg/dL (H)). Liver Function Tests: Recent Labs  Lab 11/19/21 1835  AST 16  ALT 7  ALKPHOS 116  BILITOT 0.5  PROT 7.8  ALBUMIN 3.2*   No results for input(s): LIPASE, AMYLASE in the last 168 hours. Recent Labs  Lab 11/19/21 1835  AMMONIA 14   Coagulation Profile: No results for input(s): INR, PROTIME in the last 168 hours. Cardiac Enzymes: No results for input(s): CKTOTAL, CKMB, CKMBINDEX, TROPONINI in the last 168 hours. BNP (last 3 results) No results for input(s): PROBNP in the last 8760 hours. HbA1C: No results for input(s): HGBA1C in the last 72 hours. CBG: No results for input(s):  GLUCAP in the last 168 hours. Lipid Profile: No results for input(s): CHOL, HDL, LDLCALC,  TRIG, CHOLHDL, LDLDIRECT in the last 72 hours. Thyroid Function Tests: No results for input(s): TSH, T4TOTAL, FREET4, T3FREE, THYROIDAB in the last 72 hours. Anemia Panel: No results for input(s): VITAMINB12, FOLATE, FERRITIN, TIBC, IRON, RETICCTPCT in the last 72 hours. Urine analysis:    Component Value Date/Time   COLORURINE YELLOW 11/19/2021 2109   APPEARANCEUR CLEAR 11/19/2021 2109   LABSPEC 1.020 11/19/2021 2109   PHURINE 6.0 11/19/2021 2109   GLUCOSEU NEGATIVE 11/19/2021 2109   HGBUR NEGATIVE 11/19/2021 2109   BILIRUBINUR SMALL (A) 11/19/2021 2109   [place] NEGATIVE 11/19/2021 2109   PROTEINUR 100 (A) 11/19/2021 2109   UROBILINOGEN 1.0 07/07/2015 1917   NITRITE NEGATIVE 11/19/2021 2109   LEUKOCYTESUR SMALL (A) 11/19/2021 2109   Sepsis Labs: @LABRCNTIP (procalcitonin:4,lacticidven:4) ) Recent Results (from the past 240 hour(s))  Resp Panel by RT-PCR (Flu A&B, Covid) Nasopharyngeal Swab     Status: None   Collection Time: 11/19/21  5:33 PM   Specimen: Nasopharyngeal Swab; Nasopharyngeal(NP) swabs in vial transport medium  Result Value Ref Range Status   SARS Coronavirus 2 by RT PCR NEGATIVE NEGATIVE Final    Comment: (NOTE) SARS-CoV-2 target nucleic acids are NOT DETECTED.  The SARS-CoV-2 RNA is generally detectable in upper respiratory specimens during the acute phase of infection. The lowest concentration of SARS-CoV-2 viral copies this assay can detect is 138 copies/mL. A negative result does not preclude SARS-Cov-2 infection and should not be used as the sole basis for treatment or other patient management decisions. A negative result may occur with  improper specimen collection/handling, submission of specimen other than nasopharyngeal swab, presence of viral mutation(s) within the areas targeted by this assay, and inadequate number of viral copies(<138 copies/mL). A  negative result must be combined with clinical observations, patient history, and epidemiological information. The expected result is Negative.  Fact Sheet for Patients:  EntrepreneurPulse.com.au  Fact Sheet for Healthcare Providers:  IncredibleEmployment.be  This test is no t yet approved or cleared by the [place] FDA and  has been authorized for detection and/or diagnosis of SARS-CoV-2 by FDA under an Emergency Use Authorization (EUA). This EUA will remain  in effect (meaning this test can be used) for the duration of the COVID-19 declaration under Section 564(b)(1) of the Act, 21 U.S.C.section 360bbb-3(b)(1), unless the authorization is terminated  or revoked sooner.       Influenza A by PCR NEGATIVE NEGATIVE Final   Influenza B by PCR NEGATIVE NEGATIVE Final    Comment: (NOTE) The Xpert Xpress SARS-CoV-2/FLU/RSV plus assay is intended as an aid in the diagnosis of influenza from Nasopharyngeal swab specimens and should not be used as a sole basis for treatment. Nasal washings and aspirates are unacceptable for Xpert Xpress SARS-CoV-2/FLU/RSV testing.  Fact Sheet for Patients: EntrepreneurPulse.com.au  Fact Sheet for Healthcare Providers: IncredibleEmployment.be  This test is not yet approved or cleared by the [place] FDA and has been authorized for detection and/or diagnosis of SARS-CoV-2 by FDA under an Emergency Use Authorization (EUA). This EUA will remain in effect (meaning this test can be used) for the duration of the COVID-19 declaration under Section 564(b)(1) of the Act, 21 U.S.C. section 360bbb-3(b)(1), unless the authorization is terminated or revoked.  Performed at [facility], [place] [address] [place], [place] [address]      Radiological Exams on Admission: CT Head Wo Contrast  Result Date: 11/19/2021 CLINICAL DATA:  [place] EXAM: CT HEAD WITHOUT CONTRAST  TECHNIQUE: Contiguous axial images were obtained from the base of  the skull through the vertex without intravenous contrast. RADIATION DOSE REDUCTION: This exam was performed according to the departmental dose-optimization program which includes automated exposure control, adjustment of the mA and/or kV according to patient size and/or use of iterative reconstruction technique. COMPARISON:  CT head dated August 06, 2021 FINDINGS: Brain: No evidence of acute infarction, hemorrhage, hydrocephalus, extra-axial collection or mass lesion/mass effect. Moderate cerebral atrophy and advanced microvascular ischemic changes of the white matter. Vascular: No hyperdense vessel or unexpected calcification. Skull: Normal. Negative for fracture or focal lesion. Sinuses/Orbits: No acute finding. Other: None. IMPRESSION: 1. No acute intracranial abnormality. 2. Advanced cerebral atrophy and microvascular ischemic changes of the white matter, unchanged. Electronically Signed   By: Keane Police D.O.   On: 11/19/2021 18:32   DG Chest Portable 1 View  Result Date: 11/19/2021 CLINICAL DATA:  Altered mental status. EXAM: PORTABLE CHEST 1 VIEW COMPARISON:  Chest two views 06/21/2021 FINDINGS: Cardiac silhouette is again at the upper limits of normal size. There is again a tortuous thoracic aorta. Mild calcification is again seen within aortic arch. The lungs are clear. No pulmonary edema, pleural effusion, or pneumothorax. There is a screw anchor overlying the superolateral left humeral head. IMPRESSION: No acute lung process. Electronically Signed   By: Yvonne Kendall M.D.   On: 11/19/2021 17:14    EKG: Independently reviewed.  Normal sinus rhythm.  Assessment/Plan Principal Problem:   Acute encephalopathy Active Problems:   Essential hypertension   Dementia without behavioral disturbance (HCC)   Thrombocytopenia (HCC)   Cancer of ascending colon (HCC)   Acute kidney injury ([place])    Acute encephalopathy/unresponsive  episode cause not clear could be from metabolic reasons with worsening renal function and also could be with recent increased gabapentin dose with worsening renal function could be causing the changes.  However since patient also has history of colon cancer we will get MRI brain EEG and continue with hydration.  The patient also shows features concerning for UTI and diverticulitis we will keep patient on empiric antibiotics.  For now I am holding off patient's gabapentin and mirtazapine. Acute on chronic kidney disease stage IV likely worsened because of poor oral intake.  We will keep patient on IV fluids follow metabolic panel. Possible diverticulitis seen in CAT scan for which I have placed patient on empiric antibiotics.  Since patient has recent surgery May discussed with general surgery in the morning. Possibility on empty antibiotics follow cultures. Unresponsive episode noted in the skilled nursing facility.  Check EEG and MRI following telemetry. Chronic anemia and thrombocytopenia follow CBC.  Had received 1 unit of PRBC transfusion last week. Colon cancer status post proximal colectomy on December 30th 2022.  Being followed by Dr. Alen Blew and general surgery. Dementia on Namenda and Aricept. History of gout on allopurinol.  Dose confirmed with pharmacy and okay to continue. Hypothyroidism on Synthroid.  Note that at this time I am holding off patient's gabapentin mirtazapine which can be restarted once patient mental status improves.  Probably start gabapentin at a lower dose.   DVT prophylaxis: SCDs.  Since patient has thrombocytopenia and at this time platelet counts was not clearly able to be counted we will repeat CBC until then we will keep patient on SCDs. Code Status: Full code. Family Communication: Patient's son. Disposition Plan: Back to history when stable. Consults called: None. Admission status: Observation.   Rise Patience MD [facility] Pager [phone].  If 7PM-7AM, please contact night-coverage [email or web address] Password [email or web address]  11/20/2021, 12:27 AM

## 2021-11-20 NOTE — ED Notes (Signed)
Breakfast ordered 

## 2021-11-20 NOTE — ED Notes (Signed)
EEG at bedside.

## 2021-11-20 NOTE — Procedures (Signed)
EEG Inpatient Electroencephalogram Report Clinical Neurophysiology Services   Patient Name: Susan [name] Age: 81 y.o. Sex: female MRN: [id number] Date: 11/20/2021 Location: [facility] ED 9  Neurologist:  Lynnae Sandhoff, MD  Clinical Information   Susan [name] is a 81 y.o. female with an episode of unresponsiveness and lethargy. History is primarily from the son who is currently at the bedside. He was on his way to go see her at the facility and they called and told him that she was "unresponsive". By the time he got there she seemed to be responsive but seemed lethargic and sleepy. She seems weaker than normal but her mental status to him is normal for her. She has a history of chronic dementia.  She has been getting weaker and losing weight ever since getting into the rehab facility after her colon surgery for colon cancer. Last week she received a unit of blood for a blood transfusion. No known illness and she seemed fine when family visited her yesterday, though the son was not there. No recent medication changes according to the son. Patient states she has a little bit of a headache when asked but otherwise states she feels good.  Medications   Prior to Admission medications   Medication Sig Start Date End Date Taking? Authorizing Provider  acetaminophen (TYLENOL) 500 MG tablet Take 500 mg by mouth 3 (three) times daily.   Yes [provider]  acetaminophen (TYLENOL) 500 MG tablet Take 500 mg by mouth every 6 (six) hours as needed for moderate pain.   Yes [provider]  allopurinol (ZYLOPRIM) 300 MG tablet Take 300 mg by mouth daily. 07/20/20  Yes [provider]  donepezil (ARICEPT) 10 MG tablet Take 10 mg by mouth daily. 07/20/20  Yes [provider]  famotidine (PEPCID) 20 MG tablet Take 20 mg by mouth 2 (two) times daily. 03/08/21  Yes [provider]  gabapentin (NEURONTIN) 100 MG capsule Take 2 capsules (200 mg total) by mouth 3 (three) times  daily. 10/08/21  Yes [place] Donnamarie Poag, DO  levothyroxine (SYNTHROID) 75 MCG tablet Take 75 mcg by mouth every morning. 12/18/20  Yes [provider]  megestrol (MEGACE) 40 MG/ML suspension Take 200 mg by mouth See admin instructions. Qd x 4 weeks   Yes [provider]  memantine (NAMENDA) 10 MG tablet Take 1 tablet (10 mg total) by mouth 2 (two) times daily. 11/30/20  Yes [name], NP  mirtazapine (REMERON) 15 MG tablet Take 15 mg by mouth at bedtime. 07/20/20  Yes [provider]  montelukast (SINGULAIR) 10 MG tablet Take 10 mg by mouth at bedtime.   Yes [provider]  Nutritional Supplements (BOOST PO) Take 237 mLs by mouth in the morning and at bedtime. vanilla   Yes [provider]  pantoprazole (PROTONIX) 40 MG tablet Take 1 tablet (40 mg total) by mouth 2 (two) times daily. 10/08/21  Yes [place] Eric J, DO  polycarbophil (FIBERCON) 625 MG tablet Take 1 tablet (625 mg total) by mouth 2 (two) times daily. 10/08/21  Yes [place] Eric J, DO  polyethylene glycol powder (GLYCOLAX/MIRALAX) 17 GM/SCOOP powder Take 17 g by mouth daily.   Yes [provider]  Ertapenem Sodium (INVANZ IJ) Inject 1 g as directed See admin instructions. Hs x 3 days Patient not taking: Reported on 11/19/2021    [provider]  ferrous sulfate 325 (65 FE) MG tablet Take 1 tablet (325 mg total) by mouth daily with breakfast. Patient  not taking: Reported on 11/19/2021 10/09/21   [place] Donnamarie Poag, DO  SODIUM CHLORIDE IJ Inject into the skin See admin instructions. Sodium chloride 0.9 % 60ml/hr sq every shift for a total of 3 liters. Patient not taking: Reported on 11/19/2021    [provider]    Recording Conditions   This is a routine less than one hour EEG recording.  The EEG was performed utilizing standard international 10-20 system of electrode placement, with additional channels monitored for eye movement.  One channel electrocardiogram was monitored.   Data were obtained, stored, and interpreted according to ACNS guidelines (J Clin Neurophysiol 2006;23(2):85-183) utilizing referential montage recording, with reformatting to longitudinal, transverse bipolar, and referential montages as necessary for interpretation, along with digital/automated EEG analysis.  Patient tolerated entire procedure well.  Photic stimulation and hyperventilation were utilized as activation procedures unless otherwise specified below.  EEG Description   Background Activity: Posterior dominant rhythm (PDR) 8-9Hz  occasionally seen. The predominant background consisted of moderate voltage arrhythmic theta and delta occasionally intermixed with fast activity. Occasional brief periods with attenuation of voltage. Paroxysmal Abnormalities: None Drowsiness/Sleep: Elements of normal sleep architecture, including sleep spindles and vertex waves, were appreciated. State changes were appreciated. Stimulation: Not performed ECG Recording: EKG did not show overt abnormality Recording Quality: Electrode and EMG artifact frequently seen. Motion, and eye movement artifacts were occasionally noted.  EEG Interpretation   Ictal Events: None 2.   Interictal Abnormalities: Generalized slowing, occasional brief periods of attenuation. 3.   Non-Epileptic Events: None  Clinical Correlation   No seizures were recorded.   No interictal epileptiform abnormalities were recorded.  The absence of epileptiform abnormalities does not preclude a clinical diagnosis of seizures.     EEG background findings are suggestive dysfunction of deep white matter and a mild to moderate encephalopathy but are etiologically nonspecific.   Electronically signed by:  Lynnae Sandhoff, MD Page: [phone] 11/20/2021, 11:06 AM

## 2021-11-21 DIAGNOSIS — D696 Thrombocytopenia, unspecified: Secondary | ICD-10-CM

## 2021-11-21 DIAGNOSIS — C182 Malignant neoplasm of ascending colon: Secondary | ICD-10-CM

## 2021-11-21 LAB — RENAL FUNCTION PANEL
Albumin: 3 g/dL — ABNORMAL LOW (ref 3.5–5.0)
Anion gap: 10 (ref 5–15)
BUN: 20 mg/dL (ref 8–23)
CO2: 24 mmol/L (ref 22–32)
Calcium: 9.5 mg/dL (ref 8.9–10.3)
Chloride: 106 mmol/L (ref 98–111)
Creatinine, Ser: 1.53 mg/dL — ABNORMAL HIGH (ref 0.44–1.00)
GFR, Estimated: 34 mL/min — ABNORMAL LOW (ref >60)
Glucose, Bld: 81 mg/dL (ref 70–99)
Phosphorus: 2.2 mg/dL — ABNORMAL LOW (ref 2.5–4.6)
Potassium: 3.5 mmol/L (ref 3.5–5.1)
Sodium: 140 mmol/L (ref 135–145)

## 2021-11-21 LAB — CBC
HCT: 27.9 % — ABNORMAL LOW (ref 36.0–46.0)
Hemoglobin: 9 g/dL — ABNORMAL LOW (ref 12.0–15.0)
MCH: 26.2 pg (ref 26.0–34.0)
MCHC: 32.3 g/dL (ref 30.0–36.0)
MCV: 81.1 fL (ref 80.0–100.0)
Platelets: 95 10*3/uL — ABNORMAL LOW (ref 150–400)
RBC: 3.44 MIL/uL — ABNORMAL LOW (ref 3.87–5.11)
RDW: 16.8 % — ABNORMAL HIGH (ref 11.5–15.5)
WBC: 4.2 10*3/uL (ref 4.0–10.5)
nRBC: 0 % (ref 0.0–0.2)

## 2021-11-21 LAB — URINE CULTURE

## 2021-11-21 LAB — T4, FREE: Free T4: 0.72 ng/dL (ref 0.61–1.12)

## 2021-11-21 LAB — TSH: TSH: 17.844 u[IU]/mL — ABNORMAL HIGH (ref 0.350–4.500)

## 2021-11-21 MED ORDER — LACTATED RINGERS IV SOLN: INTRAVENOUS | Status: AC

## 2021-11-21 MED ORDER — SODIUM CHLORIDE 0.9 % IV SOLN
1.0000 g | Freq: Three times a day (TID) | INTRAVENOUS | Status: DC
  Administered 2021-11-21 – 2021-11-22 (×2): 1 g via INTRAVENOUS
  Filled 2021-11-21 (×4): qty 1

## 2021-11-21 NOTE — Progress Notes (Signed)
Pharmacy Antibiotic Note  Susan [name] is a 81 y.o. female admitted on 11/19/2021 with UTI.  Pharmacy has been consulted for aztreonam dosing. Only symptom is mild suprapubic pain upon exam, no other urinary symptoms. Of note, patient is also being evaluated for diverticulitis.   2/17 UA insignificant with WBC 6-10 2/17 UCx: multiple spp, suggest recollect  Plan: Aztreonam 1g IV Q8H; adjust interval if renal function improves. Follow-up clinical progression and length of therapy.   Height: 5\' 2"  (157.5 cm) Weight: 45.8 kg (101 lb) IBW/kg (Calculated) : 50.1  Temp (24hrs), Avg:98.3 F (36.8 C), Min:98 F (36.7 C), Max:98.6 F (37 C)  Recent Labs  Lab 11/19/21 1835 11/20/21 0249 11/21/21 0136  WBC 6.9 5.5 4.2  CREATININE 2.72* 2.40* 1.53*  LATICACIDVEN 1.3  --   --      Estimated Creatinine Clearance: 20.5 mL/min (A) (by C-G formula based on SCr of 1.53 mg/dL (H)).    Allergies  Allergen Reactions   Codeine Anaphylaxis   Penicillins Anaphylaxis   Bactrim Itching and Swelling   Clarithromycin Other (See Comments)    "Caused skin to peel off my hand."   Flagyl [Metronidazole Hcl] Itching and Swelling   Food Other (See Comments)    EATS WHITE RICE ONLY (ALLERGIC REACTION TO YELLOW OR BROWN RICE THAT RESULTED IN HOSPITALIZATION)   Pineapple Itching and Swelling    SWELLING OF EYES   [place]ice (Diagnostic)     Other reaction(s): brown or yellow   Sulfa Antibiotics Itching and Swelling    Thank you for allowing pharmacy to be a part of this patients care.  Donald Pore, PharmD Pharmacy Resident 11/21/2021, 2:34 PM

## 2021-11-21 NOTE — TOC Progression Note (Signed)
Transition of Care [facility]) - Initial/Assessment Note    Patient Details  Name: Susan [name] [place] MRN: [id number] Date of Birth: [birth year]  Transition [facility] Care [facility]) CM/SW Contact:    Milinda Antis, [place] Phone Number: 11/21/2021, 11:27 AM  Clinical Narrative:                 CSW received notification from attending that the patient is medically ready to return to the facility.  CSW contacted Olivia Mackie with admissions at [facility] to inquire about whether the patient can return today.  The facility can accept tomorrow when they are fully staffed.    Attending MD, floor RN, and patient's son informed of the above information.          Patient Goals and CMS Choice        Expected Discharge Plan and Services           Expected Discharge Date: 11/21/21                                    Prior Living Arrangements/Services                       Activities of Daily Living      Permission Sought/Granted                  Emotional Assessment              Admission diagnosis:  Acute urinary tract infection [N39.0] Acute encephalopathy [G93.40] Acute kidney injury ([place]) [N17.9] Patient Active Problem List   Diagnosis Date Noted   Acute kidney injury ([place]) 11/20/2021   Acute encephalopathy 11/19/2021   Inguinal hernia, left 10/02/2021   Cancer of ascending colon ([place]) 09/30/2021   IDA (iron deficiency anemia) 09/30/2021   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   Gastritis without bleeding    Melena    Thrombocytopenia ([place]) 05/03/2021   Allergic rhinitis 03/13/2021   Acute on chronic blood loss anemia 01/08/2021   Cerebral vascular disease 06/09/2020   Pain due to onychomycosis of toenails of both feet 04/08/2020   Porokeratosis 04/08/2020   Dementia without behavioral disturbance ([place]) 02/27/2020   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 [place] [address] Phone: [phone] Fax: [phone]     Social Determinants of Health ([place]) Interventions    Readmission Risk Interventions Readmission Risk Prevention Plan 09/29/2021  Transportation Screening Complete  PCP or Specialist Appt within 5-7 Days Complete  Home Care Screening Complete  Medication Review (RN CM) Complete  Some recent data might be hidden

## 2021-11-21 NOTE — Progress Notes (Signed)
Progress Note  Patient: Susan [name] YFV:[id number] DOB: 07-13-1941  DOA: 11/19/2021  DOS: 11/21/2021    Brief hospital course: Susan [name] is an 81 y.o. female with a history of dementia, colon CA s/p ascending colon resection and anastomosis 10/01/2021, anemia, thrombocytopenia, and hypothyroidism who presented to the ED 2/17 after an unresponsive episode, described as lethargy, at her SNF. She had been eating and drinking less over the past few days, had gabapentin dose recently increased, and was found to have AKI on arrival to the ED with creatinine 2.7 from previous 1.8 and suspected baseline of 1.4. Urinalysis was suggestive of infection, WBC normal, afebrile. Hgb improved from prior having had a transfusion the previous week. CT head nonacute, subsequent MRI brain nonacute, and EEG with nonspecific dysfunction without seizure activity. IV fluids and antibiotics were started and admission requested. Due to abdominal tenderness on exam, CT abd w/o contrast was performed and showed thickening of the descending colon wall with adjacent fat stranding suggestive of diverticulitis. Surgery was consulted, recommending conservative management. Diet has been advanced without discomfort, she remains afebrile without leukocytosis.   Assessment and Plan: AKI on stage IIIb CKD: Improved back to baseline with supportive care. No evidence of obstructive uropathy on CT. No RBCs on UA micro, 100 protein.  - Continue IVF and push oral intake. She has no vomiting or objective evidence of inability to take po. Will encourage and assist with meals.  - Avoid nephrotoxins. - Trend BMP   UTI: Suspected based on urinalysis findings and some suprapubic tenderness on my exam. - Continue aztreonam. Significant list of antibiotic allergies limits choices in this regard. - Monitor urine culture.    Possible diverticulitis: Mild with minimal specific tenderness on exam, no bloody BM/diarrhea, no fever or leukocytosis.   - Antibiotics as above, advanced diet is being tolerated. Does not require IV pain medications, but will support with IVF.   Acute encephalopathy on chronic dementia: Lethargy suggestive of metabolic derangement, perhaps gabapentin toxicity due to prerenal azotemia/dehydration. Infectious etiology also possibly contributing. Right-side predominant microhemorrhages on MRI without acute findings. 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 - Appreciate general surgery evaluation. No complication at operative site on CT. Routine follow up recommended.   Chronic blood loss anemia, thrombocytopenia: Improved/stable   Hypothyroidism:  - Check TSH, last was 21 with normal T4, low T3. TSH is coming down on recheck here to 17, T4 still wnl. Will continue synthroid 23mcg.   Subjective: confused, did eat without issues when prompted but not hungry. Denies pain, N/V/D. No fever or overnight events (other than sundowning which her son says is her usual).  Objective: Vitals:   11/21/21 0000 11/21/21 0400 11/21/21 0737 11/21/21 1111  BP: (!) 143/81 (!) 126/112 (!) 155/95 (!) 169/106  Pulse: 62 65 96 83  Resp: 12 15 14 15   Temp: 98.4 F (36.9 C) 98.6 F (37 C) 98.2 F (36.8 C) 98.3 F (36.8 C)  TempSrc: Oral Oral Axillary Oral  SpO2: 94% 96% 98% 100%  Weight:      Height:       Gen: Elderly female in no distress Pulm: Nonlabored breathing room air.  CV: Regular rate and rhythm. No murmur, rub, or gallop. No JVD, no dependent edema. GI: Abdomen soft, non-tender, non-distended, with normoactive bowel sounds.  Ext: Warm, no deformities Skin: No rashes, lesions or ulcers on visualized skin. Neuro: Alert and disoriented without focal  neurological deficits. Psych: Judgement and insight appear impaired. Mood euthymic & affect congruent. Behavior is appropriate.    Data Personally  reviewed:  CBC: Recent Labs  Lab 11/19/21 1835 11/19/21 2133 11/20/21 0249 11/21/21 0136  WBC 6.9  --  5.5 4.2  NEUTROABS 5.0  --  2.8  --   HGB 9.6* 9.5* 8.7* 9.0*  HCT 30.0* 28.0* 26.6* 27.9*  MCV 82.2  --  82.1 81.1  PLT PLATELET CLUMPS NOTED ON SMEAR, UNABLE TO ESTIMATE  --  101* 95*   Basic Metabolic Panel: Recent Labs  Lab 11/19/21 1835 11/19/21 2133 11/20/21 0249 11/21/21 0136  NA 141 145 140 140  K 3.7 3.6 3.8 3.5  CL 108  --  109 106  CO2 21*  --  22 24  GLUCOSE 91  --  86 81  BUN 27*  --  26* 20  CREATININE 2.72*  --  2.40* 1.53*  CALCIUM 9.6  --  9.0 9.5  PHOS  --   --   --  2.2*   GFR: Estimated Creatinine Clearance: 20.5 mL/min (A) (by C-G formula based on SCr of 1.53 mg/dL (H)). Liver Function Tests: Recent Labs  Lab 11/19/21 1835 11/20/21 0249 11/21/21 0136  AST 16 14*  --   ALT 7 6  --   ALKPHOS 116 103  --   BILITOT 0.5 0.4  --   PROT 7.8 6.7  --   ALBUMIN 3.2* 2.8* 3.0*   No results for input(s): LIPASE, AMYLASE in the last 168 hours. Recent Labs  Lab 11/19/21 1835  AMMONIA 14   Coagulation Profile: No results for input(s): INR, PROTIME in the last 168 hours. Cardiac Enzymes: No results for input(s): CKTOTAL, CKMB, CKMBINDEX, TROPONINI in the last 168 hours. BNP (last 3 results) No results for input(s): PROBNP in the last 8760 hours. HbA1C: No results for input(s): HGBA1C in the last 72 hours. CBG: No results for input(s): GLUCAP in the last 168 hours. Lipid Profile: No results for input(s): CHOL, HDL, LDLCALC, TRIG, CHOLHDL, LDLDIRECT in the last 72 hours. Thyroid Function Tests: Recent Labs    11/21/21 0136  TSH 17.844*  FREET4 0.72   Anemia Panel: No results for input(s): VITAMINB12, FOLATE, FERRITIN, TIBC, IRON, RETICCTPCT in the last 72 hours. Urine analysis:    Component Value Date/Time   COLORURINE YELLOW 11/19/2021 2109   APPEARANCEUR CLEAR 11/19/2021 2109   LABSPEC 1.020 11/19/2021 2109   PHURINE 6.0  11/19/2021 2109   GLUCOSEU NEGATIVE 11/19/2021 2109   HGBUR NEGATIVE 11/19/2021 2109   BILIRUBINUR SMALL (A) 11/19/2021 2109   [place] NEGATIVE 11/19/2021 2109   PROTEINUR 100 (A) 11/19/2021 2109   UROBILINOGEN 1.0 07/07/2015 1917   NITRITE NEGATIVE 11/19/2021 2109   LEUKOCYTESUR SMALL (A) 11/19/2021 2109   Recent Results (from the past 240 hour(s))  Resp Panel by RT-PCR (Flu A&B, Covid) Nasopharyngeal Swab     Status: None   Collection Time: 11/19/21  5:33 PM   Specimen: Nasopharyngeal Swab; Nasopharyngeal(NP) swabs in vial transport medium  Result Value Ref Range Status   SARS Coronavirus 2 by RT PCR NEGATIVE NEGATIVE Final    Comment: (NOTE) SARS-CoV-2 target nucleic acids are NOT DETECTED.  The SARS-CoV-2 RNA is generally detectable in upper respiratory specimens during the acute phase of infection. The lowest concentration of SARS-CoV-2 viral copies this assay can detect is 138 copies/mL. A negative result does not preclude SARS-Cov-2 infection and should not be used as the sole basis for treatment or other  patient management decisions. A negative result may occur with  improper specimen collection/handling, submission of specimen other than nasopharyngeal swab, presence of viral mutation(s) within the areas targeted by this assay, and inadequate number of viral copies(<138 copies/mL). A negative result must be combined with clinical observations, patient history, and epidemiological information. The expected result is Negative.  Fact Sheet for Patients:  EntrepreneurPulse.com.au  Fact Sheet for Healthcare Providers:  IncredibleEmployment.be  This test is no t yet approved or cleared by the [place] FDA and  has been authorized for detection and/or diagnosis of SARS-CoV-2 by FDA under an Emergency Use Authorization (EUA). This EUA will remain  in effect (meaning this test can be used) for the duration of the COVID-19 declaration  under Section 564(b)(1) of the Act, 21 U.S.C.section 360bbb-3(b)(1), unless the authorization is terminated  or revoked sooner.       Influenza A by PCR NEGATIVE NEGATIVE Final   Influenza B by PCR NEGATIVE NEGATIVE Final    Comment: (NOTE) The Xpert Xpress SARS-CoV-2/FLU/RSV plus assay is intended as an aid in the diagnosis of influenza from Nasopharyngeal swab specimens and should not be used as a sole basis for treatment. Nasal washings and aspirates are unacceptable for Xpert Xpress SARS-CoV-2/FLU/RSV testing.  Fact Sheet for Patients: EntrepreneurPulse.com.au  Fact Sheet for Healthcare Providers: IncredibleEmployment.be  This test is not yet approved or cleared by the [place] FDA and has been authorized for detection and/or diagnosis of SARS-CoV-2 by FDA under an Emergency Use Authorization (EUA). This EUA will remain in effect (meaning this test can be used) for the duration of the COVID-19 declaration under Section 564(b)(1) of the Act, 21 U.S.C. section 360bbb-3(b)(1), unless the authorization is terminated or revoked.  Performed at [facility], [place] [address], [place], [place] [address]   Urine Culture     Status: Abnormal   Collection Time: 11/19/21  9:09 PM   Specimen: Urine, Catheterized  Result Value Ref Range Status   Specimen Description URINE, CATHETERIZED  Final   Special Requests   Final    NONE Performed at [facility], [place] [address], [place], [place] [address]    Culture MULTIPLE SPECIES PRESENT, SUGGEST RECOLLECTION (A)  Final   Report Status 11/21/2021 FINAL  Final     CT Head Wo Contrast  Result Date: 11/19/2021 CLINICAL DATA:  Delirium EXAM: CT HEAD WITHOUT CONTRAST TECHNIQUE: Contiguous axial images were obtained from the base of the skull through the vertex without intravenous contrast. RADIATION DOSE REDUCTION: This exam was performed according to the departmental dose-optimization program  which includes automated exposure control, adjustment of the mA and/or kV according to patient size and/or use of iterative reconstruction technique. COMPARISON:  CT head dated August 06, 2021 FINDINGS: Brain: No evidence of acute infarction, hemorrhage, hydrocephalus, extra-axial collection or mass lesion/mass effect. Moderate cerebral atrophy and advanced microvascular ischemic changes of the white matter. Vascular: No hyperdense vessel or unexpected calcification. Skull: Normal. Negative for fracture or focal lesion. Sinuses/Orbits: No acute finding. Other: None. IMPRESSION: 1. No acute intracranial abnormality. 2. Advanced cerebral atrophy and microvascular ischemic changes of the white matter, unchanged. Electronically Signed   By: Keane Police D.O.   On: 11/19/2021 18:32   MR BRAIN WO CONTRAST  Result Date: 11/20/2021 CLINICAL DATA:  81 year old female with confusion, lethargy. EXAM: MRI HEAD WITHOUT CONTRAST TECHNIQUE: Multiplanar, multiecho pulse sequences of the brain and surrounding structures were obtained without intravenous contrast. COMPARISON:  Head CT 11/19/2021. Brain MRI 06/04/2020.  FINDINGS: Brain: No restricted diffusion to suggest acute infarction. No midline shift, mass effect, evidence of mass lesion, ventriculomegaly, extra-axial collection or acute intracranial hemorrhage. Cervicomedullary junction and pituitary are within normal limits. Chronic widespread Patchy and confluent white matter T2 and FLAIR hyperintensity. Bilateral deep white matter capsule involvement. No significant change since 2021. Small to intermediate number of chronic microhemorrhages, widely scattered in the right hemisphere, right temporal lobe most affected. Lesser involvement of the left hemisphere. Deep gray matter nuclei, brainstem and cerebellum appear spared. No cortical encephalomalacia. Mild T2 heterogeneity in the basal ganglia greater on the left. Thalamus, brainstem and cerebellum within normal limits.  Vascular: Major intracranial vascular flow voids are stable since 2021. Dominant right vertebral artery. Mild generalized intracranial artery tortuosity. Skull and upper cervical spine: Partially visible cervical spine degeneration not significantly changed. Visualized bone marrow signal is within normal limits. Sinuses/Orbits: Postoperative changes to both globes. Paranasal sinuses and mastoids are stable and well aerated. Other: Grossly normal visible internal auditory structures visible scalp and face appear negative. IMPRESSION: 1. No acute intracranial abnormality. 2. 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 Intermediate number of chronic micro-hemorrhages scattered in the brain, although mostly the right hemisphere. Pattern is not strongly suggestive of amyloid angiopathy. Electronically Signed   By: Genevie Ann M.D.   On: 11/20/2021 07:20   DG Chest Portable 1 View  Result Date: 11/19/2021 CLINICAL DATA:  Altered mental status. EXAM: PORTABLE CHEST 1 VIEW COMPARISON:  Chest two views 06/21/2021 FINDINGS: Cardiac silhouette is again at the upper limits of normal size. There is again a tortuous thoracic aorta. Mild calcification is again seen within aortic arch. The lungs are clear. No pulmonary edema, pleural effusion, or pneumothorax. There is a screw anchor overlying the superolateral left humeral head. IMPRESSION: No acute lung process. Electronically Signed   By: Yvonne Kendall M.D.   On: 11/19/2021 17:14   EEG adult  Result Date: 11/20/2021 Gwinda Maine, MD     11/20/2021 11:46 AM EEG Inpatient Electroencephalogram Report Clinical Neurophysiology Services Patient Name: Susan [name] Age: 81 y.o. Sex: female MRN: [id number] Date: 11/20/2021 Location: [facility] ED 9 Neurologist:  Lynnae Sandhoff, MD Clinical Information Susan [name] is a 81 y.o. female with an episode of unresponsiveness and lethargy. History is primarily from the son who is currently at the bedside. He was on his way  to go see her at the facility and they called and told him that she was "unresponsive". By the time he got there she seemed to be responsive but seemed lethargic and sleepy. She seems weaker than normal but her mental status to him is normal for her. She has a history of chronic dementia.  She has been getting weaker and losing weight ever since getting into the rehab facility after her colon surgery for colon cancer. Last week she received a unit of blood for a blood transfusion. No known illness and she seemed fine when family visited her yesterday, though the son was not there. No recent medication changes according to the son. Patient states she has a little bit of a headache when asked but otherwise states she feels good. Medications Prior to Admission medications  Medication Sig Start Date End Date Taking? Authorizing Provider acetaminophen (TYLENOL) 500 MG tablet Take 500 mg by mouth 3 (three) times daily.   Yes [provider] acetaminophen (TYLENOL) 500 MG tablet Take 500 mg by mouth every 6 (six) hours as needed for moderate pain.   Yes [provider] allopurinol (ZYLOPRIM) 300 MG tablet Take 300 mg by mouth daily. 07/20/20  Yes [provider] donepezil (ARICEPT) 10 MG tablet Take 10 mg by mouth daily. 07/20/20  Yes [provider] famotidine (PEPCID) 20 MG tablet Take 20 mg by mouth 2 (two) times daily. 03/08/21  Yes [provider] gabapentin (NEURONTIN) 100 MG capsule Take 2 capsules (200 mg total) by mouth 3 (three) times daily. 10/08/21  Yes [place] Donnamarie Poag, DO levothyroxine (SYNTHROID) 75 MCG tablet Take 75 mcg by mouth every morning. 12/18/20  Yes [provider] megestrol (MEGACE) 40 MG/ML suspension Take 200 mg by mouth See admin instructions. Qd x 4 weeks   Yes [provider] memantine (NAMENDA) 10 MG tablet Take 1 tablet (10 mg total) by mouth 2 (two) times daily. 11/30/20  Yes [name], NP mirtazapine (REMERON) 15 MG tablet Take  15 mg by mouth at bedtime. 07/20/20  Yes [provider] montelukast (SINGULAIR) 10 MG tablet Take 10 mg by mouth at bedtime.   Yes [provider] Nutritional Supplements (BOOST PO) Take 237 mLs by mouth in the morning and at bedtime. vanilla   Yes [provider] pantoprazole (PROTONIX) 40 MG tablet Take 1 tablet (40 mg total) by mouth 2 (two) times daily. 10/08/21  Yes [place] Eric J, DO polycarbophil (FIBERCON) 625 MG tablet Take 1 tablet (625 mg total) by mouth 2 (two) times daily. 10/08/21  Yes [place] Eric J, DO polyethylene glycol powder (GLYCOLAX/MIRALAX) 17 GM/SCOOP powder Take 17 g by mouth daily.   Yes [provider] Ertapenem Sodium (INVANZ IJ) Inject 1 g as directed See admin instructions. Hs x 3 days Patient not taking: Reported on 11/19/2021    [provider] ferrous sulfate 325 (65 FE) MG tablet Take 1 tablet (325 mg total) by mouth daily with breakfast. Patient not taking: Reported on 11/19/2021 10/09/21   [place] Donnamarie Poag, DO SODIUM CHLORIDE IJ Inject into the skin See admin instructions. Sodium chloride 0.9 % 78ml/hr sq every shift for a total of 3 liters. Patient not taking: Reported on 11/19/2021    [provider] Recording Conditions This is a routine less than one hour EEG recording.  The EEG was performed utilizing standard international 10-20 system of electrode placement, with additional channels monitored for eye movement.  One channel electrocardiogram was monitored.  Data were obtained, stored, and interpreted according to ACNS guidelines (J Clin Neurophysiol 2006;23(2):85-183) utilizing referential montage recording, with reformatting to longitudinal, transverse bipolar, and referential montages as necessary for interpretation, along with digital/automated EEG analysis.  Patient tolerated entire procedure well.  Photic stimulation and hyperventilation were utilized as activation procedures unless otherwise specified below. EEG  Description Background Activity: Posterior dominant rhythm (PDR) 8-9Hz  occasionally seen. The predominant background consisted of moderate voltage arrhythmic theta and delta occasionally intermixed with fast activity. Occasional brief periods with attenuation of voltage. Paroxysmal Abnormalities: None Drowsiness/Sleep: Elements of normal sleep architecture, including sleep spindles and vertex waves, were appreciated. State changes were appreciated. Stimulation: Not performed ECG Recording: EKG did not show overt abnormality Recording Quality: Electrode and EMG artifact frequently seen. Motion, and eye movement artifacts were occasionally noted. EEG Interpretation Ictal Events: None 2.   Interictal Abnormalities: Generalized slowing, occasional brief periods of attenuation. 3.   Non-Epileptic Events: None Clinical Correlation No seizures were recorded.  No interictal epileptiform abnormalities were recorded.  The absence of epileptiform abnormalities does not preclude a clinical diagnosis of seizures.   EEG background findings are  suggestive dysfunction of deep white matter and a mild to moderate encephalopathy but are etiologically nonspecific. Electronically signed by: Lynnae Sandhoff, MD Page: [phone] 11/20/2021, 11:06 AM   CT RENAL STONE STUDY  Result Date: 11/20/2021 CLINICAL DATA:  Flank pain, kidney stone suspected. EXAM: CT ABDOMEN AND PELVIS WITHOUT CONTRAST TECHNIQUE: Multidetector CT imaging of the abdomen and pelvis was performed following the standard protocol without IV contrast. RADIATION DOSE REDUCTION: This exam was performed according to the departmental dose-optimization program which includes automated exposure control, adjustment of the mA and/or kV according to patient size and/or use of iterative reconstruction technique. COMPARISON:  08/06/2021. FINDINGS: Lower chest: The heart is normal in size and there is no pericardial effusion. Multi-vessel coronary artery calcifications are noted.  Atelectasis is noted at the lung bases. Hepatobiliary: No focal liver abnormality. The gallbladder is without stones. No biliary ductal dilatation. Pancreas: Unremarkable. No pancreatic ductal dilatation or surrounding inflammatory changes. Spleen: Normal in size without focal abnormality. Adrenals/Urinary Tract: The adrenal glands are within normal limits. Renal cysts are present bilaterally. A nonobstructive calculus is present on the right. No obstructive uropathy bilaterally. Air is present in the urinary bladder which may be iatrogenic. Stomach/Bowel: The stomach is within normal limits. No bowel obstruction, free air, or pneumatosis. Scattered diverticula are noted along the colon. There is mild pericolonic fat stranding involving the descending colon with associated bowel wall thickening. Right hemicolectomy changes are noted. Vascular/Lymphatic: Aortic atherosclerosis. No enlarged abdominal or pelvic lymph nodes. Reproductive: Uterus and bilateral adnexa are unremarkable. Other: Small fat containing periumbilical hernia.  No ascites. Musculoskeletal: Degenerative changes are present in the thoracolumbar spine. There is a stable compression deformity at L1. A mixed lytic and sclerotic region is noted in the left femoral head, possible avascular necrosis. IMPRESSION: 1. Right renal calculus and bilateral renal cysts. No obstructive uropathy bilaterally. 2. Colonic diverticulosis. There is bowel wall thickening with mild pericolonic fat stranding involving the descending colon, possible diverticulitis. 3. Aortic atherosclerosis. Electronically Signed   By: Brett Fairy M.D.   On: 11/20/2021 01:23    SARS-CoV-2 PCR: Negative Influenza A/B: Negative  Family Communication: Son by phone  Disposition: Status is: Inpatient Remains inpatient appropriate because: unsafe DC, requires discharge back to facility and they can't accept today. Will DC to [facility] 2/20.    Patrecia Pour, MD 11/21/2021 11:45 AM Page  by [email or web address]

## 2021-11-21 NOTE — Progress Notes (Signed)
Subjective: Patient is confused this morning.  Difficult to tell if she is having any abdominal discomfort.  Ate a bite of muffin for me but stopped as it was too sweet.    ROS: See above, otherwise other systems negative  Objective: Vital signs in last 24 hours: Temp:  [98 F (36.7 C)-98.6 F (37 C)] 98.2 F (36.8 C) (02/19 0737) Pulse Rate:  [60-96] 96 (02/19 0737) Resp:  [10-17] 14 (02/19 0737) BP: (111-158)/(75-112) 155/95 (02/19 0737) SpO2:  [94 %-100 %] 98 % (02/19 0737) Last BM Date : 11/19/21  Intake/Output from previous day: 02/18 0701 - 02/19 0700 In: 1560 [I.V.:1460; IV Piggyback:100] Out: 175 [Urine:175] Intake/Output this shift: Total I/O In: -  Out: 500 [Urine:500]  PE: Abd: soft, seems minimally tender in lower abdomen, +BS, ND, no guarding or rebounding  Lab Results:  Recent Labs    11/20/21 0249 11/21/21 0136  WBC 5.5 4.2  HGB 8.7* 9.0*  HCT 26.6* 27.9*  PLT 101* 95*   BMET Recent Labs    11/20/21 0249 11/21/21 0136  NA 140 140  K 3.8 3.5  CL 109 106  CO2 22 24  GLUCOSE 86 81  BUN 26* 20  CREATININE 2.40* 1.53*  CALCIUM 9.0 9.5   PT/INR No results for input(s): LABPROT, INR in the last 72 hours. CMP     Component Value Date/Time   NA 140 11/21/2021 0136   NA 143 06/30/2017 0846   K 3.5 11/21/2021 0136   K 4.4 06/30/2017 0846   CL 106 11/21/2021 0136   CO2 24 11/21/2021 0136   CO2 24 06/30/2017 0846   GLUCOSE 81 11/21/2021 0136   GLUCOSE 89 06/30/2017 0846   BUN 20 11/21/2021 0136   BUN 26.5 (H) 06/30/2017 0846   CREATININE 1.53 (H) 11/21/2021 0136   CREATININE 1.82 (H) 11/12/2021 1156   CREATININE 1.31 (H) 07/02/2018 0950   CREATININE 1.4 (H) 06/30/2017 0846   CALCIUM 9.5 11/21/2021 0136   CALCIUM 10.1 06/30/2017 0846   PROT 6.7 11/20/2021 0249   PROT 7.6 06/30/2017 0846   ALBUMIN 3.0 (L) 11/21/2021 0136   ALBUMIN 4.1 06/30/2017 0846   AST 14 (L) 11/20/2021 0249   AST 12 (L) 11/12/2021 1156   AST 17 06/30/2017  0846   ALT 6 11/20/2021 0249   ALT 5 11/12/2021 1156   ALT <6 06/30/2017 0846   ALKPHOS 103 11/20/2021 0249   ALKPHOS 101 06/30/2017 0846   BILITOT 0.4 11/20/2021 0249   BILITOT 0.4 11/12/2021 1156   BILITOT 0.76 06/30/2017 0846   GFRNONAA 34 (L) 11/21/2021 0136   GFRNONAA 27 (L) 11/12/2021 1156   GFRNONAA 39 (L) 07/02/2018 0950   GFRAA 30 (L) 05/22/2020 1123   GFRAA 45 (L) 07/02/2018 0950   Lipase     Component Value Date/Time   LIPASE 75 (H) 03/12/2021 2016       Studies/Results: CT Head Wo Contrast  Result Date: 11/19/2021 CLINICAL DATA:  Delirium EXAM: CT HEAD WITHOUT CONTRAST TECHNIQUE: Contiguous axial images were obtained from the base of the skull through the vertex without intravenous contrast. RADIATION DOSE REDUCTION: This exam was performed according to the departmental dose-optimization program which includes automated exposure control, adjustment of the mA and/or kV according to patient size and/or use of iterative reconstruction technique. COMPARISON:  CT head dated August 06, 2021 FINDINGS: Brain: No evidence of acute infarction, hemorrhage, hydrocephalus, extra-axial collection or mass lesion/mass effect. Moderate cerebral atrophy and advanced microvascular  ischemic changes of the white matter. Vascular: No hyperdense vessel or unexpected calcification. Skull: Normal. Negative for fracture or focal lesion. Sinuses/Orbits: No acute finding. Other: None. IMPRESSION: 1. No acute intracranial abnormality. 2. Advanced cerebral atrophy and microvascular ischemic changes of the white matter, unchanged. Electronically Signed   By: Keane Police D.O.   On: 11/19/2021 18:32   MR BRAIN WO CONTRAST  Result Date: 11/20/2021 CLINICAL DATA:  81 year old female with confusion, lethargy. EXAM: MRI HEAD WITHOUT CONTRAST TECHNIQUE: Multiplanar, multiecho pulse sequences of the brain and surrounding structures were obtained without intravenous contrast. COMPARISON:  Head CT 11/19/2021.  Brain MRI 06/04/2020. FINDINGS: Brain: No restricted diffusion to suggest acute infarction. No midline shift, mass effect, evidence of mass lesion, ventriculomegaly, extra-axial collection or acute intracranial hemorrhage. Cervicomedullary junction and pituitary are within normal limits. Chronic widespread Patchy and confluent white matter T2 and FLAIR hyperintensity. Bilateral deep white matter capsule involvement. No significant change since 2021. Small to intermediate number of chronic microhemorrhages, widely scattered in the right hemisphere, right temporal lobe most affected. Lesser involvement of the left hemisphere. Deep gray matter nuclei, brainstem and cerebellum appear spared. No cortical encephalomalacia. Mild T2 heterogeneity in the basal ganglia greater on the left. Thalamus, brainstem and cerebellum within normal limits. Vascular: Major intracranial vascular flow voids are stable since 2021. Dominant right vertebral artery. Mild generalized intracranial artery tortuosity. Skull and upper cervical spine: Partially visible cervical spine degeneration not significantly changed. Visualized bone marrow signal is within normal limits. Sinuses/Orbits: Postoperative changes to both globes. Paranasal sinuses and mastoids are stable and well aerated. Other: Grossly normal visible internal auditory structures visible scalp and face appear negative. IMPRESSION: 1. No acute intracranial abnormality. 2. 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 Intermediate number of chronic micro-hemorrhages scattered in the brain, although mostly the right hemisphere. Pattern is not strongly suggestive of amyloid angiopathy. Electronically Signed   By: Genevie Ann M.D.   On: 11/20/2021 07:20   DG Chest Portable 1 View  Result Date: 11/19/2021 CLINICAL DATA:  Altered mental status. EXAM: PORTABLE CHEST 1 VIEW COMPARISON:  Chest two views 06/21/2021 FINDINGS: Cardiac silhouette is again at the upper limits  of normal size. There is again a tortuous thoracic aorta. Mild calcification is again seen within aortic arch. The lungs are clear. No pulmonary edema, pleural effusion, or pneumothorax. There is a screw anchor overlying the superolateral left humeral head. IMPRESSION: No acute lung process. Electronically Signed   By: Yvonne Kendall M.D.   On: 11/19/2021 17:14   EEG adult  Result Date: 11/20/2021 Gwinda Maine, MD     11/20/2021 11:46 AM EEG Inpatient Electroencephalogram Report Clinical Neurophysiology Services Patient Name: Koi Helmers Age: 81 y.o. Sex: female MRN: [id number] Date: 11/20/2021 Location: [facility] ED 9 Neurologist:  Lynnae Sandhoff, MD Clinical Information Mida Cory Byrd is a 81 y.o. female with an episode of unresponsiveness and lethargy. History is primarily from the son who is currently at the bedside. He was on his way to go see her at the facility and they called and told him that she was "unresponsive". By the time he got there she seemed to be responsive but seemed lethargic and sleepy. She seems weaker than normal but her mental status to him is normal for her. She has a history of chronic dementia.  She has been getting weaker and losing weight ever since getting into the rehab facility after her colon surgery for colon cancer. Last week she received a unit of  blood for a blood transfusion. No known illness and she seemed fine when family visited her yesterday, though the son was not there. No recent medication changes according to the son. Patient states she has a little bit of a headache when asked but otherwise states she feels good. Medications Prior to Admission medications  Medication Sig Start Date End Date Taking? Authorizing Provider acetaminophen (TYLENOL) 500 MG tablet Take 500 mg by mouth 3 (three) times daily.   Yes [provider] acetaminophen (TYLENOL) 500 MG tablet Take 500 mg by mouth every 6 (six) hours as needed for moderate pain.   Yes [provider]  allopurinol (ZYLOPRIM) 300 MG tablet Take 300 mg by mouth daily. 07/20/20  Yes [provider] donepezil (ARICEPT) 10 MG tablet Take 10 mg by mouth daily. 07/20/20  Yes [provider] famotidine (PEPCID) 20 MG tablet Take 20 mg by mouth 2 (two) times daily. 03/08/21  Yes [provider] gabapentin (NEURONTIN) 100 MG capsule Take 2 capsules (200 mg total) by mouth 3 (three) times daily. 10/08/21  Yes [place] Donnamarie Poag, DO levothyroxine (SYNTHROID) 75 MCG tablet Take 75 mcg by mouth every morning. 12/18/20  Yes [provider] megestrol (MEGACE) 40 MG/ML suspension Take 200 mg by mouth See admin instructions. Qd x 4 weeks   Yes [provider] memantine (NAMENDA) 10 MG tablet Take 1 tablet (10 mg total) by mouth 2 (two) times daily. 11/30/20  Yes [name], NP mirtazapine (REMERON) 15 MG tablet Take 15 mg by mouth at bedtime. 07/20/20  Yes [provider] montelukast (SINGULAIR) 10 MG tablet Take 10 mg by mouth at bedtime.   Yes [provider] Nutritional Supplements (BOOST PO) Take 237 mLs by mouth in the morning and at bedtime. vanilla   Yes [provider] pantoprazole (PROTONIX) 40 MG tablet Take 1 tablet (40 mg total) by mouth 2 (two) times daily. 10/08/21  Yes [place] Eric J, DO polycarbophil (FIBERCON) 625 MG tablet Take 1 tablet (625 mg total) by mouth 2 (two) times daily. 10/08/21  Yes [place] Eric J, DO polyethylene glycol powder (GLYCOLAX/MIRALAX) 17 GM/SCOOP powder Take 17 g by mouth daily.   Yes [provider] Ertapenem Sodium (INVANZ IJ) Inject 1 g as directed See admin instructions. Hs x 3 days Patient not taking: Reported on 11/19/2021    [provider] ferrous sulfate 325 (65 FE) MG tablet Take 1 tablet (325 mg total) by mouth daily with breakfast. Patient not taking: Reported on 11/19/2021 10/09/21   [place] Donnamarie Poag, DO SODIUM CHLORIDE IJ Inject into the skin See admin instructions. Sodium chloride 0.9 % 37ml/hr sq  every shift for a total of 3 liters. Patient not taking: Reported on 11/19/2021    [provider] Recording Conditions This is a routine less than one hour EEG recording.  The EEG was performed utilizing standard international 10-20 system of electrode placement, with additional channels monitored for eye movement.  One channel electrocardiogram was monitored.  Data were obtained, stored, and interpreted according to ACNS guidelines (J Clin Neurophysiol 2006;23(2):85-183) utilizing referential montage recording, with reformatting to longitudinal, transverse bipolar, and referential montages as necessary for interpretation, along with digital/automated EEG analysis.  Patient tolerated entire procedure well.  Photic stimulation and hyperventilation were utilized as activation procedures unless otherwise specified below. EEG Description Background Activity: Posterior dominant rhythm (PDR) 8-9Hz  occasionally seen. The predominant background consisted of moderate voltage arrhythmic theta and delta occasionally intermixed with fast activity. Occasional brief periods with  attenuation of voltage. Paroxysmal Abnormalities: None Drowsiness/Sleep: Elements of normal sleep architecture, including sleep spindles and vertex waves, were appreciated. State changes were appreciated. Stimulation: Not performed ECG Recording: EKG did not show overt abnormality Recording Quality: Electrode and EMG artifact frequently seen. Motion, and eye movement artifacts were occasionally noted. EEG Interpretation Ictal Events: None 2.   Interictal Abnormalities: Generalized slowing, occasional brief periods of attenuation. 3.   Non-Epileptic Events: None Clinical Correlation No seizures were recorded.  No interictal epileptiform abnormalities were recorded.  The absence of epileptiform abnormalities does not preclude a clinical diagnosis of seizures.   EEG background findings are suggestive dysfunction of deep white matter and a mild to  moderate encephalopathy but are etiologically nonspecific. Electronically signed by: Lynnae Sandhoff, MD Page: [phone] 11/20/2021, 11:06 AM   CT RENAL STONE STUDY  Result Date: 11/20/2021 CLINICAL DATA:  Flank pain, kidney stone suspected. EXAM: CT ABDOMEN AND PELVIS WITHOUT CONTRAST TECHNIQUE: Multidetector CT imaging of the abdomen and pelvis was performed following the standard protocol without IV contrast. RADIATION DOSE REDUCTION: This exam was performed according to the departmental dose-optimization program which includes automated exposure control, adjustment of the mA and/or kV according to patient size and/or use of iterative reconstruction technique. COMPARISON:  08/06/2021. FINDINGS: Lower chest: The heart is normal in size and there is no pericardial effusion. Multi-vessel coronary artery calcifications are noted. Atelectasis is noted at the lung bases. Hepatobiliary: No focal liver abnormality. The gallbladder is without stones. No biliary ductal dilatation. Pancreas: Unremarkable. No pancreatic ductal dilatation or surrounding inflammatory changes. Spleen: Normal in size without focal abnormality. Adrenals/Urinary Tract: The adrenal glands are within normal limits. Renal cysts are present bilaterally. A nonobstructive calculus is present on the right. No obstructive uropathy bilaterally. Air is present in the urinary bladder which may be iatrogenic. Stomach/Bowel: The stomach is within normal limits. No bowel obstruction, free air, or pneumatosis. Scattered diverticula are noted along the colon. There is mild pericolonic fat stranding involving the descending colon with associated bowel wall thickening. Right hemicolectomy changes are noted. Vascular/Lymphatic: Aortic atherosclerosis. No enlarged abdominal or pelvic lymph nodes. Reproductive: Uterus and bilateral adnexa are unremarkable. Other: Small fat containing periumbilical hernia.  No ascites. Musculoskeletal: Degenerative changes are  present in the thoracolumbar spine. There is a stable compression deformity at L1. A mixed lytic and sclerotic region is noted in the left femoral head, possible avascular necrosis. IMPRESSION: 1. Right renal calculus and bilateral renal cysts. No obstructive uropathy bilaterally. 2. Colonic diverticulosis. There is bowel wall thickening with mild pericolonic fat stranding involving the descending colon, possible diverticulitis. 3. Aortic atherosclerosis. Electronically Signed   By: Brett Fairy M.D.   On: 11/20/2021 01:23    Anti-infectives: Anti-infectives (From admission, onward)    Start     Dose/Rate Route Frequency Ordered Stop   11/20/21 2200  aztreonam (AZACTAM) 1 g in sodium chloride 0.9 % 100 mL IVPB        1 g 200 mL/hr over 30 Minutes Intravenous Every 24 hours 11/20/21 0052     11/20/21 0030  cefTRIAXone (ROCEPHIN) 1 g in sodium chloride 0.9 % 100 mL IVPB  Status:  Discontinued        1 g 200 mL/hr over 30 Minutes Intravenous Every 24 hours 11/20/21 0029 11/20/21 0033   11/19/21 2245  aztreonam (AZACTAM) 1 g in sodium chloride 0.9 % 100 mL IVPB        1 g 200 mL/hr over 30 Minutes Intravenous  Once 11/19/21 2205  11/19/21 2305        Assessment/Plan Mild diverticulitis -no evidence of complicated diverticulitis.  Only mild findings on CT scan -agree with solid diet -WBC normal and AF -can treat with 5-7 days of abx therapy -no surgical intervention warranted -doubt this mild finding is the full source of her encephalopathy. -we will sign off at this time.  She may follow up with Dr. Johney Maine as needed  FEN - heart healthy VTE - ok for prophylaxis from our standpoint ID - [email or web address]   UTI Encephalopathy S/p recent R hemicolectomy for malignancy by Dr. Johney Maine  Straightforward Medical Decision Making  LOS: 1 day    Henreitta Cea , [facility] 11/21/2021, 10:12 AM Please see Amion for pager number during day hours 7:00am-4:30pm or 7:00am -11:30am on  weekends

## 2021-11-22 LAB — URINE CULTURE: Culture: >=100000 — AB

## 2021-11-22 LAB — T3, FREE: T3, Free: 1.4 pg/mL — ABNORMAL LOW (ref 2.0–4.4)

## 2021-11-22 MED ORDER — CIPROFLOXACIN HCL 500 MG PO TABS: 250.0000 mg | ORAL_TABLET | Freq: Every day | ORAL | Status: DC

## 2021-11-22 MED ORDER — CIPROFLOXACIN HCL 250 MG PO TABS: 250.0000 mg | ORAL_TABLET | Freq: Every day | ORAL | 0 refills | Status: AC

## 2021-11-22 NOTE — Discharge Summary (Addendum)
Physician Discharge Summary   Patient: Susan [name] MRN: [id number] DOB: 1941-02-16  Admit date:     11/19/2021  Discharge date: 11/22/21  Discharge Physician: Patrecia Pour   PCP: Sonia Side., [name]   Recommendations at discharge:   Follow up with general surgery per routine s/p right colectomy. Follow up with oncology per routine as well.  Recheck thyroid function studies in 4-6 weeks. Monitor BMP, CBC  Discharge Diagnoses: Principal Problem:   Acute encephalopathy Active Problems:   Essential hypertension   Dementia without behavioral disturbance (HCC)   Thrombocytopenia (HCC)   Cancer of ascending colon (HCC)   Acute kidney injury [facility])  Hospital Course: Susan [name] is an 81 y.o. female with a history of dementia, colon CA s/p ascending colon resection and anastomosis 10/01/2021, anemia, thrombocytopenia, and hypothyroidism who presented to the ED 2/17 after an unresponsive episode, described as lethargy, at her SNF. She had been eating and drinking less over the past few days, had gabapentin dose recently increased, and was found to have AKI on arrival to the ED with creatinine 2.7 from previous 1.8 and suspected baseline of 1.4. Urinalysis was suggestive of infection, WBC normal, afebrile. Hgb improved from prior having had a transfusion the previous week. CT head nonacute, subsequent MRI brain nonacute, and EEG with nonspecific dysfunction without seizure activity. IV fluids and antibiotics were started and admission requested. Due to abdominal tenderness on exam, CT abd w/o contrast was performed and showed thickening of the descending colon wall with adjacent fat stranding suggestive of diverticulitis. Surgery was consulted, recommending conservative management. Diet has been advanced without discomfort, she remains afebrile without leukocytosis.   Assessment and Plan: AKI on stage IIIb CKD: Improved back to baseline with supportive care. No evidence of obstructive uropathy  on CT. No RBCs on UA micro, 100 protein.  - Push oral intake. She has no vomiting or objective evidence of inability to take po. Will encourage and assist with meals.  - Avoid nephrotoxins. - Trend BMP   UTI: Suspected based on urinalysis findings and some suprapubic tenderness on my exam, though urine culture nonclonal.  - Continue cipro directed at diverticulitis as below. Significant list of antibiotic allergies limits choices in this regard. - Monitor urine culture.    Possible diverticulitis: Mild with minimal specific tenderness on exam, no bloody BM/diarrhea, no fever or leukocytosis.  - Antibiotics to continue x7 days (last dose will be 11/25/2021). D/w pharmacy, we'll go with low dose cipro. Tolerating diet.    Acute encephalopathy on chronic dementia: Lethargy suggestive of metabolic derangement, perhaps gabapentin toxicity due to prerenal azotemia/dehydration. Infectious etiology also possibly contributing. Right-side predominant microhemorrhages on MRI without acute findings. - Acute changes seem to have improved. 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 - Appreciate general surgery evaluation. No complication at operative site on CT. Routine follow up recommended.   Chronic blood loss anemia, thrombocytopenia: Improved/stable   Hypothyroidism:  - Suggest recheck TFTs in 4-6 weeks. Last TSH was 21 with normal T4, low T3. TSH is coming down on recheck here to 17, T4 still wnl. Will continue synthroid 45mcg.   Consultants: None Procedures performed: None  Disposition: Skilled nursing facility Diet recommendation:  Cardiac diet  DISCHARGE MEDICATION: Allergies as of 11/22/2021       Reactions   Codeine Anaphylaxis   Penicillins Anaphylaxis   Bactrim Itching, Swelling   Clarithromycin Other (See Comments)   "Caused skin to peel  off my hand."   Flagyl [metronidazole Hcl] Itching, Swelling    Food Other (See Comments)   EATS WHITE RICE ONLY (ALLERGIC REACTION TO YELLOW OR BROWN RICE THAT RESULTED IN HOSPITALIZATION)   Pineapple Itching, Swelling   SWELLING OF EYES   Rice (diagnostic)    Other reaction(s): brown or yellow   Sulfa Antibiotics Itching, Swelling        Medication List     STOP taking these medications    INVANZ IJ   SODIUM CHLORIDE IJ       TAKE these medications    acetaminophen 500 MG tablet Commonly known as: TYLENOL Take 500 mg by mouth 3 (three) times daily.   acetaminophen 500 MG tablet Commonly known as: TYLENOL Take 500 mg by mouth every 6 (six) hours as needed for moderate pain.   allopurinol 300 MG tablet Commonly known as: ZYLOPRIM Take 300 mg by mouth daily.   BOOST PO Take 237 mLs by mouth in the morning and at bedtime. vanilla   ciprofloxacin 250 MG tablet Commonly known as: CIPRO Take 1 tablet (250 mg total) by mouth daily with breakfast for 4 days.   donepezil 10 MG tablet Commonly known as: ARICEPT Take 10 mg by mouth daily.   famotidine 20 MG tablet Commonly known as: PEPCID Take 20 mg by mouth 2 (two) times daily.   ferrous sulfate 325 (65 FE) MG tablet Take 1 tablet (325 mg total) by mouth daily with breakfast.   gabapentin 100 MG capsule Commonly known as: NEURONTIN Take 2 capsules (200 mg total) by mouth 3 (three) times daily.   levothyroxine 75 MCG tablet Commonly known as: SYNTHROID Take 75 mcg by mouth every morning.   megestrol 40 MG/ML suspension Commonly known as: MEGACE Take 200 mg by mouth See admin instructions. Qd x 4 weeks   memantine 10 MG tablet Commonly known as: Namenda Take 1 tablet (10 mg total) by mouth 2 (two) times daily.   mirtazapine 15 MG tablet Commonly known as: REMERON Take 15 mg by mouth at bedtime.   montelukast 10 MG tablet Commonly known as: SINGULAIR Take 10 mg by mouth at bedtime.   pantoprazole 40 MG tablet Commonly known as: PROTONIX Take 1 tablet (40 mg  total) by mouth 2 (two) times daily.   polycarbophil 625 MG tablet Commonly known as: FIBERCON Take 1 tablet (625 mg total) by mouth 2 (two) times daily.   polyethylene glycol powder 17 GM/SCOOP powder Commonly known as: GLYCOLAX/MIRALAX Take 17 g by mouth daily.        Follow-up Information     Sonia Side., [name] Follow up.   Specialty: Family Medicine Contact information: [place] [address] [phone]         Michael Boston, MD Follow up.   Specialties: General Surgery, Colon and Rectal Surgery Contact information: [address] [place] [address] [phone]                Subjective: No N/V/D, no fever, no pain with specific questioning of abdomen. Discharge Exam: BP (!) 149/88 (BP Location: Left Arm)    Pulse 66    Temp 98.2 F (36.8 C) (Oral)    Resp 13    Ht 5\' 2"  (1.575 m)    Wt 45.8 kg    SpO2 100%    BMI 18.47 kg/m   No distress, not oriented but conversant Clear, nonlabored No significant abdominal tenderness, rebound or guarding or distention. +  BS.  Condition at discharge: stable  The results of significant diagnostics from this hospitalization (including imaging, microbiology, ancillary and laboratory) are listed below for reference.   Imaging Studies: CT Head Wo Contrast  Result Date: 11/19/2021 CLINICAL DATA:  Delirium EXAM: CT HEAD WITHOUT CONTRAST TECHNIQUE: Contiguous axial images were obtained from the base of the skull through the vertex without intravenous contrast. RADIATION DOSE REDUCTION: This exam was performed according to the departmental dose-optimization program which includes automated exposure control, adjustment of the mA and/or kV according to patient size and/or use of iterative reconstruction technique. COMPARISON:  CT head dated August 06, 2021 FINDINGS: Brain: No evidence of acute infarction, hemorrhage, hydrocephalus, extra-axial collection or mass lesion/mass effect. Moderate  cerebral atrophy and advanced microvascular ischemic changes of the white matter. Vascular: No hyperdense vessel or unexpected calcification. Skull: Normal. Negative for fracture or focal lesion. Sinuses/Orbits: No acute finding. Other: None. IMPRESSION: 1. No acute intracranial abnormality. 2. Advanced cerebral atrophy and microvascular ischemic changes of the white matter, unchanged. Electronically Signed   By: Keane Police D.O.   On: 11/19/2021 18:32   MR BRAIN WO CONTRAST  Result Date: 11/20/2021 CLINICAL DATA:  81 year old female with confusion, lethargy. EXAM: MRI HEAD WITHOUT CONTRAST TECHNIQUE: Multiplanar, multiecho pulse sequences of the brain and surrounding structures were obtained without intravenous contrast. COMPARISON:  Head CT 11/19/2021. Brain MRI 06/04/2020. FINDINGS: Brain: No restricted diffusion to suggest acute infarction. No midline shift, mass effect, evidence of mass lesion, ventriculomegaly, extra-axial collection or acute intracranial hemorrhage. Cervicomedullary junction and pituitary are within normal limits. Chronic widespread Patchy and confluent white matter T2 and FLAIR hyperintensity. Bilateral deep white matter capsule involvement. No significant change since 2021. Small to intermediate number of chronic microhemorrhages, widely scattered in the right hemisphere, right temporal lobe most affected. Lesser involvement of the left hemisphere. Deep gray matter nuclei, brainstem and cerebellum appear spared. No cortical encephalomalacia. Mild T2 heterogeneity in the basal ganglia greater on the left. Thalamus, brainstem and cerebellum within normal limits. Vascular: Major intracranial vascular flow voids are stable since 2021. Dominant right vertebral artery. Mild generalized intracranial artery tortuosity. Skull and upper cervical spine: Partially visible cervical spine degeneration not significantly changed. Visualized bone marrow signal is within normal limits. Sinuses/Orbits:  Postoperative changes to both globes. Paranasal sinuses and mastoids are stable and well aerated. Other: Grossly normal visible internal auditory structures visible scalp and face appear negative. IMPRESSION: 1. No acute intracranial abnormality. 2. 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 Intermediate number of chronic micro-hemorrhages scattered in the brain, although mostly the right hemisphere. Pattern is not strongly suggestive of amyloid angiopathy. Electronically Signed   By: Genevie Ann M.D.   On: 11/20/2021 07:20   DG Chest Portable 1 View  Result Date: 11/19/2021 CLINICAL DATA:  Altered mental status. EXAM: PORTABLE CHEST 1 VIEW COMPARISON:  Chest two views 06/21/2021 FINDINGS: Cardiac silhouette is again at the upper limits of normal size. There is again a tortuous thoracic aorta. Mild calcification is again seen within aortic arch. The lungs are clear. No pulmonary edema, pleural effusion, or pneumothorax. There is a screw anchor overlying the superolateral left humeral head. IMPRESSION: No acute lung process. Electronically Signed   By: Yvonne Kendall M.D.   On: 11/19/2021 17:14   EEG adult  Result Date: 11/20/2021 Gwinda Maine, MD     11/20/2021 11:46 AM EEG Inpatient Electroencephalogram Report Clinical Neurophysiology Services Patient Name: Brunilda Santiago Age: 81 y.o. Sex: female MRN: [id number] Date: 11/20/2021  Location: [facility] ED 9 Neurologist:  Lynnae Sandhoff, MD Clinical Information Jerae Izard Eggenberger is a 81 y.o. female with an episode of unresponsiveness and lethargy. History is primarily from the son who is currently at the bedside. He was on his way to go see her at the facility and they called and told him that she was "unresponsive". By the time he got there she seemed to be responsive but seemed lethargic and sleepy. She seems weaker than normal but her mental status to him is normal for her. She has a history of chronic dementia.  She has been getting weaker and losing  weight ever since getting into the rehab facility after her colon surgery for colon cancer. Last week she received a unit of blood for a blood transfusion. No known illness and she seemed fine when family visited her yesterday, though the son was not there. No recent medication changes according to the son. Patient states she has a little bit of a headache when asked but otherwise states she feels good. Medications Prior to Admission medications  Medication Sig Start Date End Date Taking? Authorizing Provider acetaminophen (TYLENOL) 500 MG tablet Take 500 mg by mouth 3 (three) times daily.   Yes [provider] acetaminophen (TYLENOL) 500 MG tablet Take 500 mg by mouth every 6 (six) hours as needed for moderate pain.   Yes [provider] allopurinol (ZYLOPRIM) 300 MG tablet Take 300 mg by mouth daily. 07/20/20  Yes [provider] donepezil (ARICEPT) 10 MG tablet Take 10 mg by mouth daily. 07/20/20  Yes [provider] famotidine (PEPCID) 20 MG tablet Take 20 mg by mouth 2 (two) times daily. 03/08/21  Yes [provider] gabapentin (NEURONTIN) 100 MG capsule Take 2 capsules (200 mg total) by mouth 3 (three) times daily. 10/08/21  Yes [place] Donnamarie Poag, DO levothyroxine (SYNTHROID) 75 MCG tablet Take 75 mcg by mouth every morning. 12/18/20  Yes [provider] megestrol (MEGACE) 40 MG/ML suspension Take 200 mg by mouth See admin instructions. Qd x 4 weeks   Yes [provider] memantine (NAMENDA) 10 MG tablet Take 1 tablet (10 mg total) by mouth 2 (two) times daily. 11/30/20  Yes [name], NP mirtazapine (REMERON) 15 MG tablet Take 15 mg by mouth at bedtime. 07/20/20  Yes [provider] montelukast (SINGULAIR) 10 MG tablet Take 10 mg by mouth at bedtime.   Yes [provider] Nutritional Supplements (BOOST PO) Take 237 mLs by mouth in the morning and at bedtime. vanilla   Yes [provider] pantoprazole (PROTONIX) 40 MG tablet  Take 1 tablet (40 mg total) by mouth 2 (two) times daily. 10/08/21  Yes [place] Eric J, DO polycarbophil (FIBERCON) 625 MG tablet Take 1 tablet (625 mg total) by mouth 2 (two) times daily. 10/08/21  Yes [place] Eric J, DO polyethylene glycol powder (GLYCOLAX/MIRALAX) 17 GM/SCOOP powder Take 17 g by mouth daily.   Yes [provider] Ertapenem Sodium (INVANZ IJ) Inject 1 g as directed See admin instructions. Hs x 3 days Patient not taking: Reported on 11/19/2021    [provider] ferrous sulfate 325 (65 FE) MG tablet Take 1 tablet (325 mg total) by mouth daily with breakfast. Patient not taking: Reported on 11/19/2021 10/09/21   [place] Donnamarie Poag, DO SODIUM CHLORIDE IJ Inject into the skin See admin instructions. Sodium chloride 0.9 % 48ml/hr sq every shift for a total of 3 liters. Patient not taking: Reported on 11/19/2021    [provider] Recording Conditions This is a routine less than one hour EEG recording.  The EEG was performed utilizing standard international 10-20 system of electrode placement, with additional channels monitored for eye movement.  One channel electrocardiogram was monitored.  Data were obtained, stored, and interpreted according to ACNS guidelines (J Clin Neurophysiol 2006;23(2):85-183) utilizing referential montage recording, with reformatting to longitudinal, transverse bipolar, and referential montages as necessary for interpretation, along with digital/automated EEG analysis.  Patient tolerated entire procedure well.  Photic stimulation and hyperventilation were utilized as activation procedures unless otherwise specified below. EEG Description Background Activity: Posterior dominant rhythm (PDR) 8-9Hz  occasionally seen. The predominant background consisted of moderate voltage arrhythmic theta and delta occasionally intermixed with fast activity. Occasional brief periods with attenuation of voltage. Paroxysmal Abnormalities: None Drowsiness/Sleep: Elements of normal  sleep architecture, including sleep spindles and vertex waves, were appreciated. State changes were appreciated. Stimulation: Not performed ECG Recording: EKG did not show overt abnormality Recording Quality: Electrode and EMG artifact frequently seen. Motion, and eye movement artifacts were occasionally noted. EEG Interpretation Ictal Events: None 2.   Interictal Abnormalities: Generalized slowing, occasional brief periods of attenuation. 3.   Non-Epileptic Events: None Clinical Correlation No seizures were recorded.  No interictal epileptiform abnormalities were recorded.  The absence of epileptiform abnormalities does not preclude a clinical diagnosis of seizures.   EEG background findings are suggestive dysfunction of deep white matter and a mild to moderate encephalopathy but are etiologically nonspecific. Electronically signed by: Lynnae Sandhoff, MD Page: [phone] 11/20/2021, 11:06 AM   CT RENAL STONE STUDY  Result Date: 11/20/2021 CLINICAL DATA:  Flank pain, kidney stone suspected. EXAM: CT ABDOMEN AND PELVIS WITHOUT CONTRAST TECHNIQUE: Multidetector CT imaging of the abdomen and pelvis was performed following the standard protocol without IV contrast. RADIATION DOSE REDUCTION: This exam was performed according to the departmental dose-optimization program which includes automated exposure control, adjustment of the mA and/or kV according to patient size and/or use of iterative reconstruction technique. COMPARISON:  08/06/2021. FINDINGS: Lower chest: The heart is normal in size and there is no pericardial effusion. Multi-vessel coronary artery calcifications are noted. Atelectasis is noted at the lung bases. Hepatobiliary: No focal liver abnormality. The gallbladder is without stones. No biliary ductal dilatation. Pancreas: Unremarkable. No pancreatic ductal dilatation or surrounding inflammatory changes. Spleen: Normal in size without focal abnormality. Adrenals/Urinary Tract: The adrenal glands are  within normal limits. Renal cysts are present bilaterally. A nonobstructive calculus is present on the right. No obstructive uropathy bilaterally. Air is present in the urinary bladder which may be iatrogenic. Stomach/Bowel: The stomach is within normal limits. No bowel obstruction, free air, or pneumatosis. Scattered diverticula are noted along the colon. There is mild pericolonic fat stranding involving the descending colon with associated bowel wall thickening. Right hemicolectomy changes are noted. Vascular/Lymphatic: Aortic atherosclerosis. No enlarged abdominal or pelvic lymph nodes. Reproductive: Uterus and bilateral adnexa are unremarkable. Other: Small fat containing periumbilical hernia.  No ascites. Musculoskeletal: Degenerative changes are present in the thoracolumbar spine. There is a stable compression deformity at L1. A mixed lytic and sclerotic region is noted in the left femoral head, possible avascular necrosis. IMPRESSION: 1. Right renal calculus and bilateral renal cysts. No obstructive uropathy bilaterally. 2. Colonic diverticulosis. There is bowel wall thickening with mild pericolonic fat stranding involving the descending colon, possible diverticulitis. 3. Aortic atherosclerosis. Electronically Signed   By: Brett Fairy M.D.   On: 11/20/2021 01:23    Microbiology: Results for orders placed or performed during  the hospital encounter of 11/19/21  Resp Panel by RT-PCR (Flu A&B, Covid) Nasopharyngeal Swab     Status: None   Collection Time: 11/19/21  5:33 PM   Specimen: Nasopharyngeal Swab; Nasopharyngeal(NP) swabs in vial transport medium  Result Value Ref Range Status   SARS Coronavirus 2 by RT PCR NEGATIVE NEGATIVE Final    Comment: (NOTE) SARS-CoV-2 target nucleic acids are NOT DETECTED.  The SARS-CoV-2 RNA is generally detectable in upper respiratory specimens during the acute phase of infection. The lowest concentration of SARS-CoV-2 viral copies this assay can detect is 138  copies/mL. A negative result does not preclude SARS-Cov-2 infection and should not be used as the sole basis for treatment or other patient management decisions. A negative result may occur with  improper specimen collection/handling, submission of specimen other than nasopharyngeal swab, presence of viral mutation(s) within the areas targeted by this assay, and inadequate number of viral copies(<138 copies/mL). A negative result must be combined with clinical observations, patient history, and epidemiological information. The expected result is Negative.  Fact Sheet for Patients:  EntrepreneurPulse.com.au  Fact Sheet for Healthcare Providers:  IncredibleEmployment.be  This test is no t yet approved or cleared by the [place] FDA and  has been authorized for detection and/or diagnosis of SARS-CoV-2 by FDA under an Emergency Use Authorization (EUA). This EUA will remain  in effect (meaning this test can be used) for the duration of the COVID-19 declaration under Section 564(b)(1) of the Act, 21 U.S.C.section 360bbb-3(b)(1), unless the authorization is terminated  or revoked sooner.       Influenza A by PCR NEGATIVE NEGATIVE Final   Influenza B by PCR NEGATIVE NEGATIVE Final    Comment: (NOTE) The Xpert Xpress SARS-CoV-2/FLU/RSV plus assay is intended as an aid in the diagnosis of influenza from Nasopharyngeal swab specimens and should not be used as a sole basis for treatment. Nasal washings and aspirates are unacceptable for Xpert Xpress SARS-CoV-2/FLU/RSV testing.  Fact Sheet for Patients: EntrepreneurPulse.com.au  Fact Sheet for Healthcare Providers: IncredibleEmployment.be  This test is not yet approved or cleared by the [place] FDA and has been authorized for detection and/or diagnosis of SARS-CoV-2 by FDA under an Emergency Use Authorization (EUA). This EUA will remain in effect (meaning  this test can be used) for the duration of the COVID-19 declaration under Section 564(b)(1) of the Act, 21 U.S.C. section 360bbb-3(b)(1), unless the authorization is terminated or revoked.  Performed at [facility], [place] [address] [place], [place] [address]   Urine Culture     Status: Abnormal   Collection Time: 11/19/21  9:09 PM   Specimen: Urine, Catheterized  Result Value Ref Range Status   Specimen Description URINE, CATHETERIZED  Final   Special Requests   Final    NONE Performed at [facility], [place] [address], [place],  [address]    Culture MULTIPLE SPECIES PRESENT, SUGGEST RECOLLECTION (A)  Final   Report Status 11/21/2021 FINAL  Final  Urine Culture     Status: Abnormal   Collection Time: 11/20/21  9:21 PM   Specimen: In/Out Cath Urine  Result Value Ref Range Status   Specimen Description IN/OUT CATH URINE  Final   Special Requests NONE  Final   Culture (A)  Final    >=100,000 COLONIES/mL LACTOBACILLUS SPECIES Standardized susceptibility testing for this organism is not available. Performed at [facility], [place] [address] [place],  [address]    Report Status 11/22/2021 FINAL  Final    Labs: CBC: Recent Labs  Lab 11/19/21 1835 11/19/21 2133 11/20/21 0249 11/21/21 0136  WBC 6.9  --  5.5 4.2  NEUTROABS 5.0  --  2.8  --   HGB 9.6* 9.5* 8.7* 9.0*  HCT 30.0* 28.0* 26.6* 27.9*  MCV 82.2  --  82.1 81.1  PLT PLATELET CLUMPS NOTED ON SMEAR, UNABLE TO ESTIMATE  --  101* 95*   Basic Metabolic Panel: Recent Labs  Lab 11/19/21 1835 11/19/21 2133 11/20/21 0249 11/21/21 0136  NA 141 145 140 140  K 3.7 3.6 3.8 3.5  CL 108  --  109 106  CO2 21*  --  22 24  GLUCOSE 91  --  86 81  BUN 27*  --  26* 20  CREATININE 2.72*  --  2.40* 1.53*  CALCIUM 9.6  --  9.0 9.5  PHOS  --   --   --  2.2*   Liver Function Tests: Recent Labs  Lab 11/19/21 1835 11/20/21 0249 11/21/21 0136  AST 16 14*  --   ALT 7 6  --   ALKPHOS 116 103  --    BILITOT 0.5 0.4  --   PROT 7.8 6.7  --   ALBUMIN 3.2* 2.8* 3.0*   CBG: No results for input(s): GLUCAP in the last 168 hours.  Discharge time spent: greater than 30 minutes.  Signed: Patrecia Pour, MD [facility] 11/22/2021

## 2021-11-22 NOTE — TOC Transition Note (Signed)
Transition of Care [facility]) - CM/SW Discharge Note   Patient Details  Name: Susan [name] MRN: [id number] Date of Birth: January 02, 1941  Transition of Care [facility]) CM/SW Contact:  Bethann Berkshire, [place] Phone Number: 11/22/2021, 11:34 AM   Clinical Narrative:     Patient will DC to: [facility] Anticipated DC date:  11/22/2021 Family notified: Son Duwaine Maxin Transport by: Corey Harold   Per MD patient ready for DC to [facility] . RN, patient, patient's family, and facility notified of DC. Discharge Summary and FL2 sent to facility. RN to call report prior to discharge [phone]). DC packet on chart. Ambulance transport requested for patient.   CSW will sign off for now as social work intervention is no longer needed. Please consult [place] again if new needs arise.     Barriers to Discharge: No Barriers Identified   Patient Goals and CMS Choice        Discharge Placement              Patient chooses bed at: [facility], [place] Patient to be transferred to facility by: [place] Name of family member notified: Son Duwaine Maxin Patient and family notified of of transfer: 11/22/21  Discharge Plan and Services                                     Social Determinants of Health (SDOH) Interventions     Readmission Risk Interventions Readmission Risk Prevention Plan 09/29/2021  Transportation Screening Complete  PCP or Specialist Appt within 5-7 Days Complete  Home Care Screening Complete  Medication Review (RN CM) Complete  Some recent data might be hidden

## 2021-11-22 NOTE — NC FL2 (Signed)
[place] MEDICAID FL2 LEVEL OF CARE SCREENING TOOL     IDENTIFICATION  Patient Name: Susan [name] Birthdate: 1941/01/10 Sex: female Admission Date (Current Location): 11/19/2021  [facility] and [place] Number:  Herbalist and Address:  [facility], [place] [address], [place], [place] [address]      Provider Number: [id number]  Attending Physician Name and Address:  Patrecia Pour, MD  Relative Name and Phone Number:  Duwaine Maxin [phone])   [phone] [facility] Phone)    Current Level of Care: Hospital Recommended Level of Care: [place] Prior Approval Number:    Date Approved/Denied:   PASRR Number:    Discharge Plan: SNF    Current Diagnoses: Patient Active Problem List   Diagnosis Date Noted   Acute kidney injury ([place]) 11/20/2021   Acute encephalopathy 11/19/2021   Inguinal hernia, left 10/02/2021   Cancer of ascending colon ([place]) 09/30/2021   IDA (iron deficiency anemia) 09/30/2021   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   Gastritis without bleeding    Melena    Thrombocytopenia ([place]) 05/03/2021   Allergic rhinitis 03/13/2021   Acute on chronic blood loss anemia 01/08/2021   Cerebral vascular disease 06/09/2020   Pain due to onychomycosis of toenails of both feet 04/08/2020   Porokeratosis 04/08/2020   Dementia without behavioral disturbance ([place]) 02/27/2020   Late onset Alzheimer's disease without behavioral disturbance ([place]) 06/12/2019   Gouty arthritis 05/28/2019   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   BMI 29.0-29.9,adult 09/02/2015   Encounter for Medicare annual wellness exam 06/02/2015   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[place]) 11/03/2014   CKD, 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) 11/02/2014   Medication management 01/27/2014   Hyperlipidemia 08/27/2013   Essential hypertension    Hypothyroidism    GERD (gastroesophageal reflux disease)    Vitamin D deficiency     Hemorrhoids 07/15/2013    Orientation RESPIRATION BLADDER Height & Weight     Self  Normal Incontinent, External catheter Weight: 101 lb (45.8 kg) Height:  5\' 2"  (157.5 cm)  BEHAVIORAL SYMPTOMS/MOOD NEUROLOGICAL BOWEL NUTRITION STATUS      Continent Diet (See d/c summary)  AMBULATORY STATUS COMMUNICATION OF NEEDS Skin   Extensive Assist Verbally Normal                       Personal Care Assistance Level of Assistance  Bathing, Feeding, Dressing Bathing Assistance: Maximum assistance Feeding assistance: Independent Dressing Assistance: Maximum assistance     Functional Limitations Info  Sight, Hearing, Speech Sight Info: Adequate Hearing Info: Adequate Speech Info: Adequate    SPECIAL CARE FACTORS FREQUENCY  PT (By licensed PT), OT (By licensed OT)     PT Frequency: 5x/week OT Frequency: 5x/week            Contractures Contractures Info: Not present    Additional Factors Info  Code Status, Allergies Code Status Info: Full code Allergies Info: Bactrim, codeine, penecillins, Clarithromycin,Flagyl (metronidazole Hcl) Sulfa Antibiotics, pineapple, EATS WHITE RICE ONLY (ALLERGIC REACTION TO YELLOW OR BROWN RICE THAT RESULTED IN HOSPITALIZATION)           Current Medications (11/22/2021):  This is the current hospital active medication list Current Facility-Administered Medications  Medication Dose Route Frequency Provider Last Rate Last Admin   allopurinol (ZYLOPRIM) tablet 300 mg  300 mg Oral Daily Rise Patience, MD   300 mg at 11/21/21 0911   aztreonam (  AZACTAM) 1 g in sodium chloride 0.9 % 100 mL IVPB  1 g Intravenous Q8H Vance Gather B, MD 200 mL/hr at 11/22/21 0606 1 g at 11/22/21 0606   donepezil (ARICEPT) tablet 10 mg  10 mg Oral Daily Rise Patience, MD   10 mg at 11/21/21 0911   famotidine (PEPCID) tablet 20 mg  20 mg Oral BID Rise Patience, MD   20 mg at 11/21/21 2255   lactated ringers infusion   Intravenous Continuous Patrecia Pour, MD 75 mL/hr at 11/21/21 1327 New Bag at 11/21/21 1327   levothyroxine (SYNTHROID) tablet 75 mcg  75 mcg Oral Q0600 Rise Patience, MD   75 mcg at 11/22/21 0603   megestrol (MEGACE) 400 MG/10ML suspension 200 mg  200 mg Oral Daily Rise Patience, MD       memantine [facility]) tablet 10 mg  10 mg Oral BID Rise Patience, MD   10 mg at 11/21/21 2255   mirtazapine (REMERON) tablet 15 mg  15 mg Oral QHS Rise Patience, MD   15 mg at 11/21/21 2255   pantoprazole (PROTONIX) EC tablet 40 mg  40 mg Oral BID Rise Patience, MD   40 mg at 11/21/21 0762     Discharge Medications: Please see discharge summary for a list of discharge medications.  Relevant Imaging Results:  Relevant Lab Results:   Additional Information SS# [id number]  [facility] Yisroel Ramming, LCSW

## 2021-11-23 ENCOUNTER — Encounter: Payer: Self-pay | Admitting: Oncology
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
## 2021-11-30 ENCOUNTER — Encounter: Payer: Self-pay | Admitting: Neurology

## 2021-11-30 ENCOUNTER — Ambulatory Visit (INDEPENDENT_AMBULATORY_CARE_PROVIDER_SITE_OTHER): Payer: Medicare Other | Admitting: Neurology

## 2021-11-30 VITALS — BP 133/83 | HR 71 | Ht 62.0 in | Wt 108.5 lb

## 2021-11-30 DIAGNOSIS — J45909 Unspecified asthma, uncomplicated: Secondary | ICD-10-CM | POA: Insufficient documentation

## 2021-11-30 DIAGNOSIS — M109 Gout, unspecified: Secondary | ICD-10-CM | POA: Insufficient documentation

## 2021-11-30 DIAGNOSIS — F32A Depression, unspecified: Secondary | ICD-10-CM | POA: Insufficient documentation

## 2021-11-30 DIAGNOSIS — M792 Neuralgia and neuritis, unspecified: Secondary | ICD-10-CM | POA: Insufficient documentation

## 2021-11-30 DIAGNOSIS — R54 Age-related physical debility: Secondary | ICD-10-CM | POA: Insufficient documentation

## 2021-11-30 DIAGNOSIS — F0390 Unspecified dementia without behavioral disturbance: Secondary | ICD-10-CM

## 2021-11-30 DIAGNOSIS — Z8774 Personal history of (corrected) congenital malformations of heart and circulatory system: Secondary | ICD-10-CM | POA: Insufficient documentation

## 2021-11-30 MED ORDER — DONEPEZIL HCL 10 MG PO TABS: 10.0000 mg | ORAL_TABLET | Freq: Every day | ORAL | 1 refills | Status: AC

## 2021-11-30 MED ORDER — MEMANTINE HCL 10 MG PO TABS: 10.0000 mg | ORAL_TABLET | Freq: Two times a day (BID) | ORAL | 1 refills | Status: AC

## 2021-11-30 NOTE — Patient Instructions (Signed)
Continue namenda, aricept for the memory, her memory has declined, memory score is lower today, recommend increased supervision, not to be left alone  Please establish with primary care in [place]

## 2021-11-30 NOTE — Progress Notes (Signed)
Chart reviewed, agree above plan ?

## 2021-12-02 ENCOUNTER — Other Ambulatory Visit: Payer: Self-pay | Admitting: Neurology

## 2021-12-03 ENCOUNTER — Inpatient Hospital Stay: Payer: Medicare Other | Attending: Oncology

## 2021-12-03 ENCOUNTER — Other Ambulatory Visit: Payer: Self-pay

## 2021-12-03 ENCOUNTER — Inpatient Hospital Stay: Payer: Medicare Other

## 2021-12-03 VITALS — BP 141/82 | HR 82 | Temp 98.2°F | Resp 14

## 2021-12-03 DIAGNOSIS — N1831 Chronic kidney disease, stage 3a: Secondary | ICD-10-CM | POA: Diagnosis present

## 2021-12-03 DIAGNOSIS — D631 Anemia in chronic kidney disease: Secondary | ICD-10-CM | POA: Diagnosis present

## 2021-12-03 DIAGNOSIS — D509 Iron deficiency anemia, unspecified: Secondary | ICD-10-CM

## 2021-12-03 DIAGNOSIS — N189 Chronic kidney disease, unspecified: Secondary | ICD-10-CM

## 2021-12-03 DIAGNOSIS — C186 Malignant neoplasm of descending colon: Secondary | ICD-10-CM | POA: Diagnosis present

## 2021-12-03 LAB — CMP (CANCER CENTER ONLY)
ALT: <5 U/L (ref 0–44)
AST: 14 U/L — ABNORMAL LOW (ref 15–41)
Albumin: 3.6 g/dL (ref 3.5–5.0)
Alkaline Phosphatase: 99 U/L (ref 38–126)
Anion gap: 11 (ref 5–15)
BUN: 20 mg/dL (ref 8–23)
CO2: 21 mmol/L — ABNORMAL LOW (ref 22–32)
Calcium: 9.4 mg/dL (ref 8.9–10.3)
Chloride: 112 mmol/L — ABNORMAL HIGH (ref 98–111)
Creatinine: 1.42 mg/dL — ABNORMAL HIGH (ref 0.44–1.00)
GFR, Estimated: 37 mL/min — ABNORMAL LOW (ref >60)
Glucose, Bld: 84 mg/dL (ref 70–99)
Potassium: 3.5 mmol/L (ref 3.5–5.1)
Sodium: 144 mmol/L (ref 135–145)
Total Bilirubin: 0.6 mg/dL (ref 0.3–1.2)
Total Protein: 8 g/dL (ref 6.5–8.1)

## 2021-12-03 LAB — CBC WITH DIFFERENTIAL (CANCER CENTER ONLY)
Abs Immature Granulocytes: 0.01 10*3/uL (ref 0.00–0.07)
Basophils Absolute: 0 10*3/uL (ref 0.0–0.1)
Basophils Relative: 0 %
Eosinophils Absolute: 0 10*3/uL (ref 0.0–0.5)
Eosinophils Relative: 1 %
HCT: 25.1 % — ABNORMAL LOW (ref 36.0–46.0)
Hemoglobin: 8.2 g/dL — ABNORMAL LOW (ref 12.0–15.0)
Immature Granulocytes: 0 %
Lymphocytes Relative: 44 %
Lymphs Abs: 2.1 10*3/uL (ref 0.7–4.0)
MCH: 26.5 pg (ref 26.0–34.0)
MCHC: 32.7 g/dL (ref 30.0–36.0)
MCV: 81.2 fL (ref 80.0–100.0)
Monocytes Absolute: 0.2 10*3/uL (ref 0.1–1.0)
Monocytes Relative: 4 %
Neutro Abs: 2.5 10*3/uL (ref 1.7–7.7)
Neutrophils Relative %: 51 %
Platelet Count: 120 10*3/uL — ABNORMAL LOW (ref 150–400)
RBC: 3.09 MIL/uL — ABNORMAL LOW (ref 3.87–5.11)
RDW: 15.9 % — ABNORMAL HIGH (ref 11.5–15.5)
WBC Count: 4.9 10*3/uL (ref 4.0–10.5)
nRBC: 0 % (ref 0.0–0.2)

## 2021-12-03 LAB — IRON AND IRON BINDING CAPACITY (CC-WL,HP ONLY)
Iron: 92 ug/dL (ref 28–170)
Saturation Ratios: 53 % — ABNORMAL HIGH (ref 10.4–31.8)
TIBC: 175 ug/dL — ABNORMAL LOW (ref 250–450)
UIBC: 83 ug/dL — ABNORMAL LOW (ref 148–442)

## 2021-12-03 LAB — FERRITIN: Ferritin: 322 ng/mL — ABNORMAL HIGH (ref 11–307)

## 2021-12-03 LAB — SAMPLE TO BLOOD BANK

## 2021-12-03 MED ORDER — DARBEPOETIN ALFA 500 MCG/ML IJ SOSY
500.0000 ug | PREFILLED_SYRINGE | Freq: Once | INTRAMUSCULAR | Status: AC
  Administered 2021-12-03: 500 ug via SUBCUTANEOUS
  Filled 2021-12-03: qty 1

## 2021-12-16 ENCOUNTER — Telehealth: Payer: Self-pay | Admitting: *Deleted

## 2021-12-16 NOTE — Telephone Encounter (Signed)
Selinda Flavin called to say he is moving Ms Hosea to [place]. She is going to see Dr Daneil Dolin on 01/12/22. ? ? She is going to come to [facility] on 12/31/21 for labs and Aranesp injection.  ?

## 2021-12-30 ENCOUNTER — Telehealth: Payer: Self-pay

## 2021-12-30 NOTE — Telephone Encounter (Signed)
Call from [place], pt's son, stating Susan [name] has been admitted to [facility] in [facility] for dehydration.  She was due for labs and Aranesp 3/31. ?

## 2021-12-31 ENCOUNTER — Inpatient Hospital Stay: Payer: Medicare Other

## 2022-02-03 ENCOUNTER — Telehealth: Payer: Self-pay | Admitting: Oncology

## 2022-02-03 NOTE — Telephone Encounter (Signed)
Called patient regarding upcoming appointment, left a voicemail. 

## 2022-02-04 ENCOUNTER — Inpatient Hospital Stay: Payer: Medicare Other | Attending: Oncology

## 2022-02-04 ENCOUNTER — Inpatient Hospital Stay: Payer: Medicare Other | Admitting: Oncology

## 2022-02-04 ENCOUNTER — Inpatient Hospital Stay: Payer: Medicare Other

## 2022-03-09 IMAGING — CT CT HEAD W/O CM
3 series · 16 of 47 positions shown, 19 images · non-contrast
Comparison: 03/08/2016, 06/04/2020

CLINICAL DATA: Fall, syncope, dizziness, weakness

EXAM:
CT HEAD WITHOUT CONTRAST
TECHNIQUE: Contiguous axial images were obtained from the base of the skull
through the vertex without intravenous contrast.

[Series 2: head wo · axial · 0.45mm/px · z∈[-131,-6]mm · 10 of 31 slices shown, 13 images]
[im 3/31  brain]
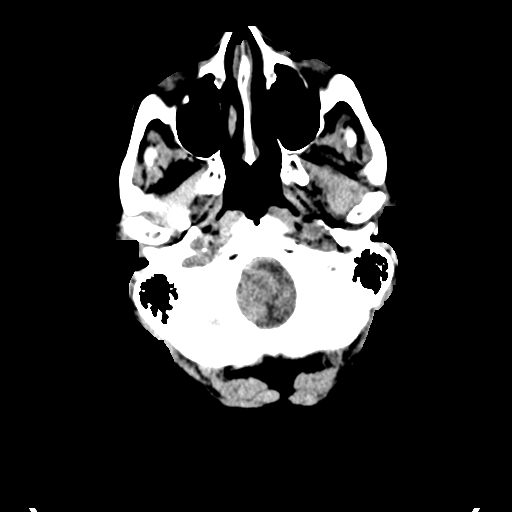
[im 3/31  bone]
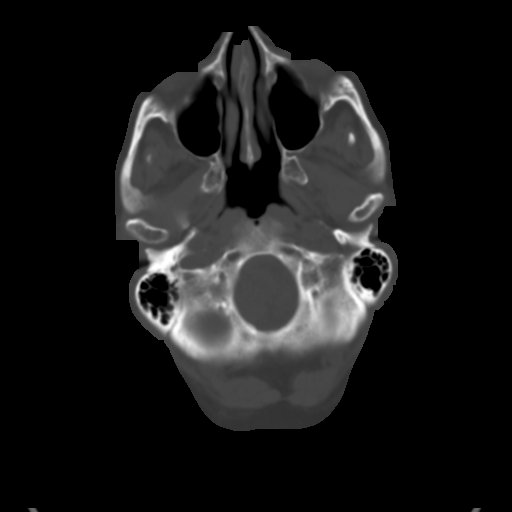
[im 6/31  brain]
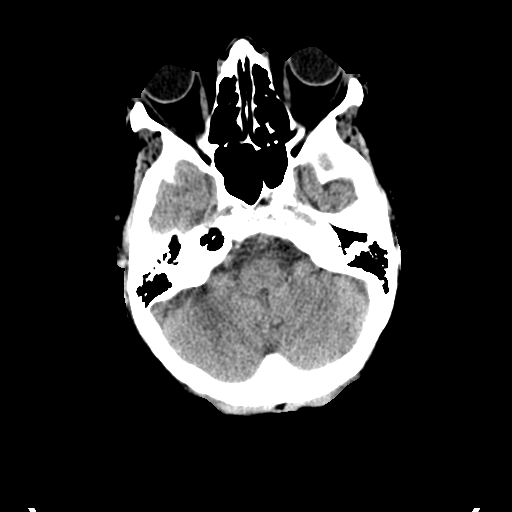
[im 9/31  brain]
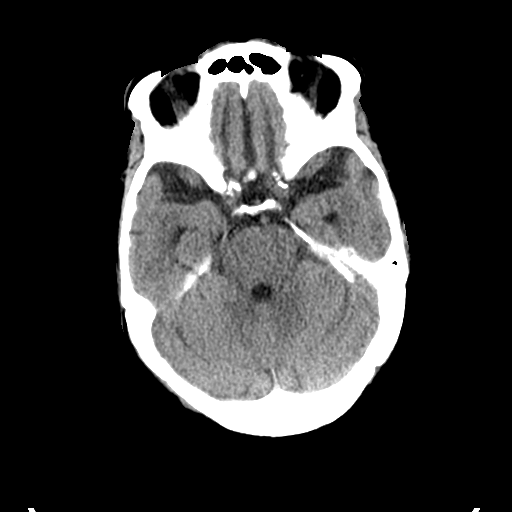
[im 11/31  brain]
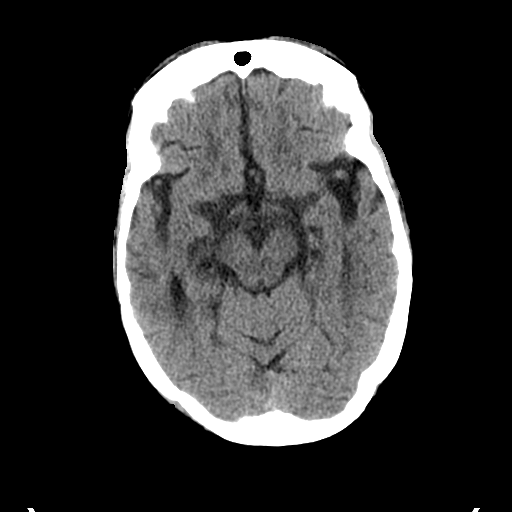
[im 14/31  brain]
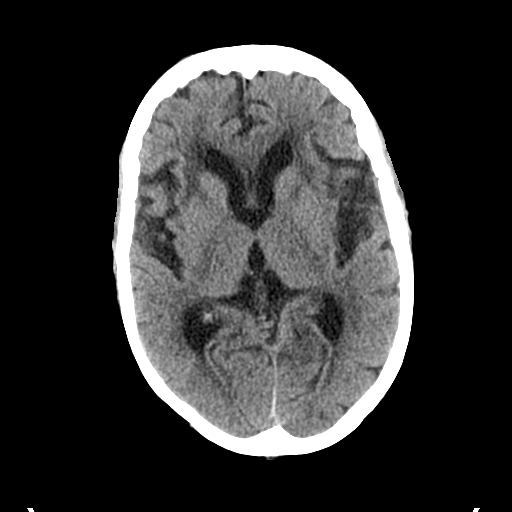
[im 14/31  bone]
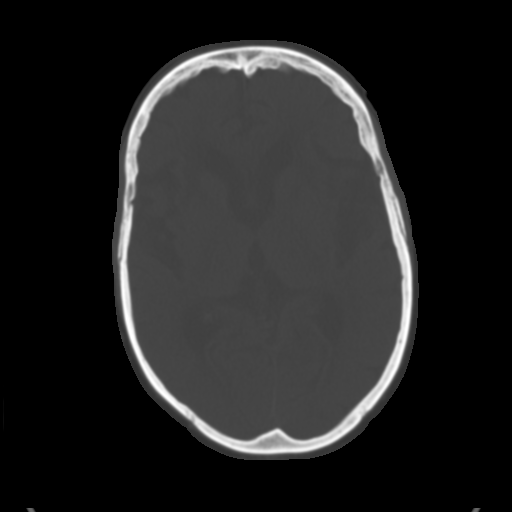
[im 17/31  brain]
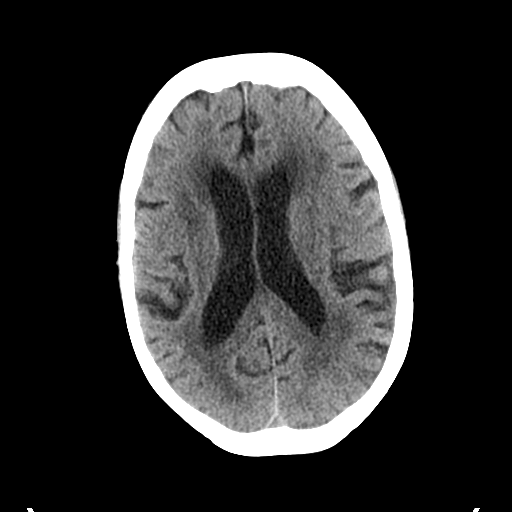
[im 20/31  brain]
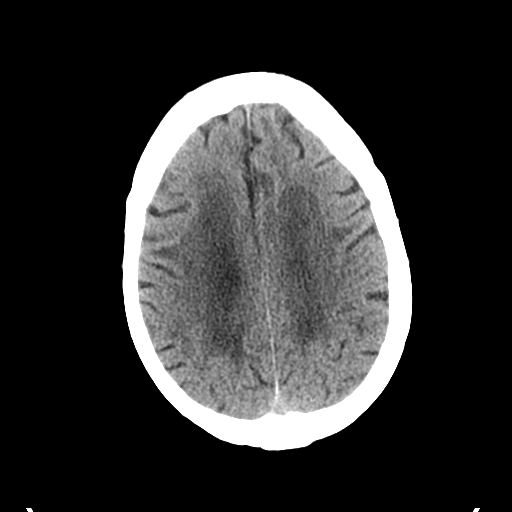
[im 23/31  brain]
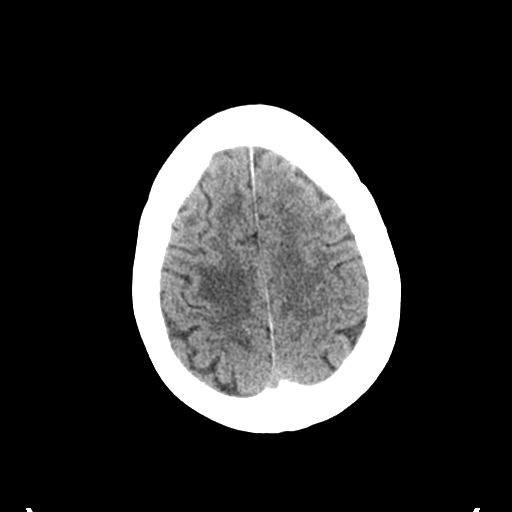
[im 25/31  brain]
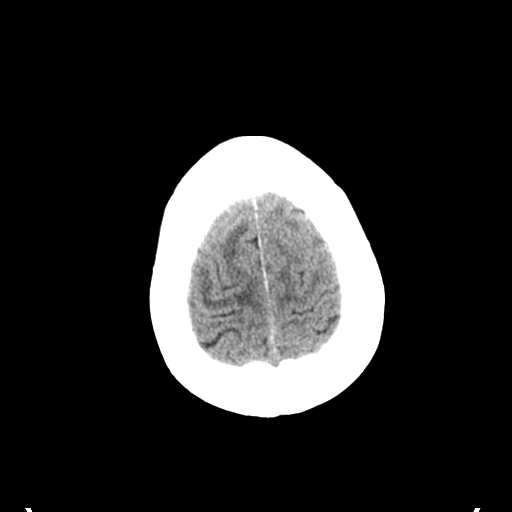
[im 25/31  bone]
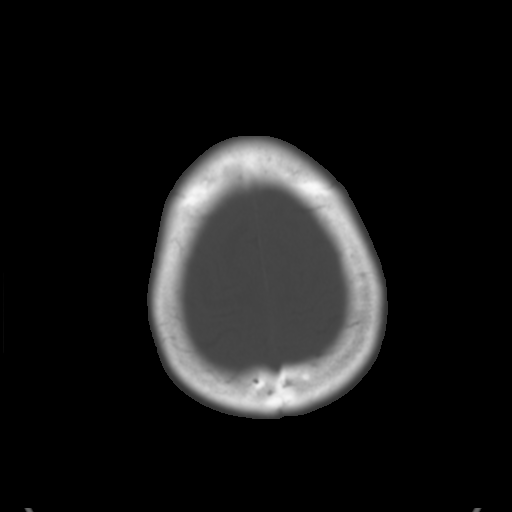
[im 28/31  brain]
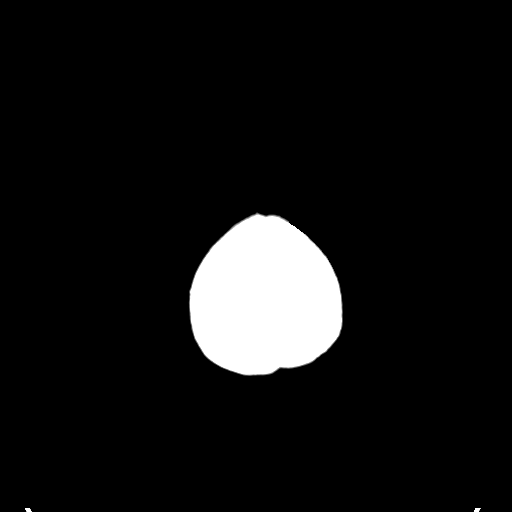

[Series 5: coronal soft tissue · coronal · 0.28mm/px · 3 of 67 slices shown]
[im 23/67  brain]
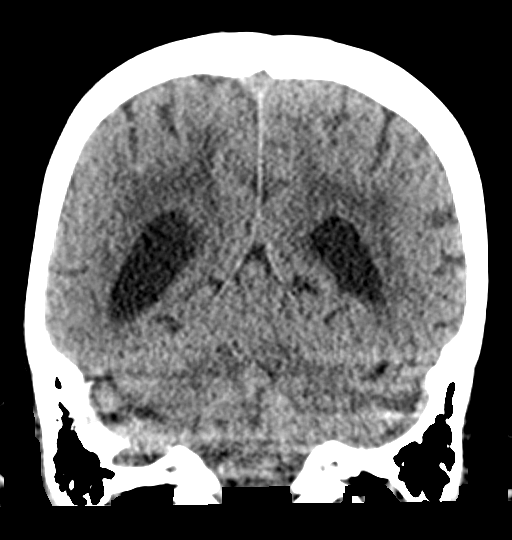
[im 30/67  brain]
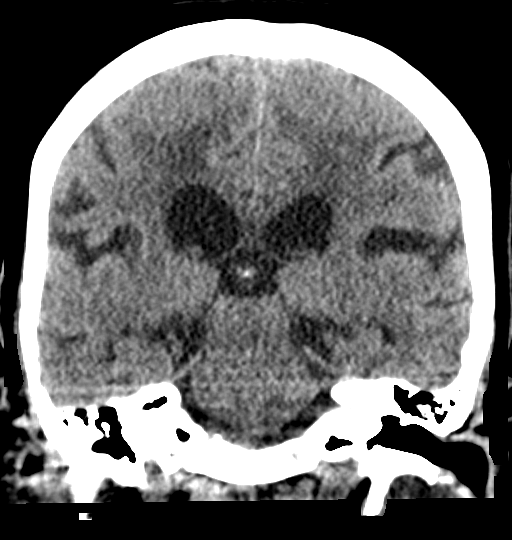
[im 37/67  brain]
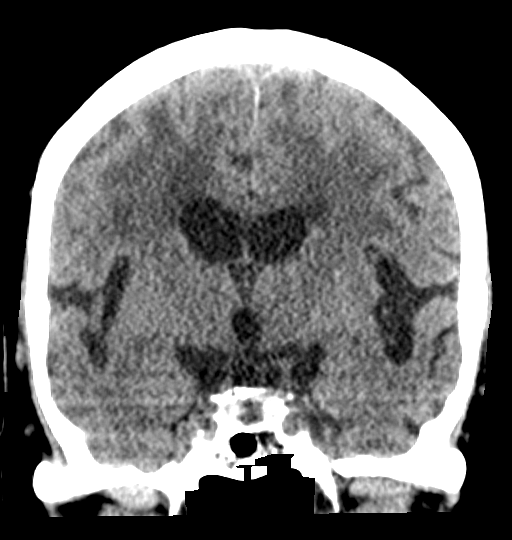

[Series 6: sagittal soft tissue · sagittal · 0.30mm/px · 3 of 49 slices shown]
[im 17/49  brain]
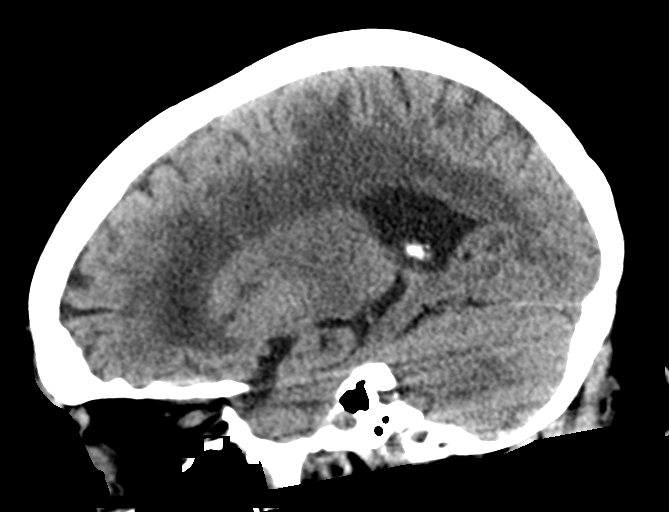
[im 25/49  brain]
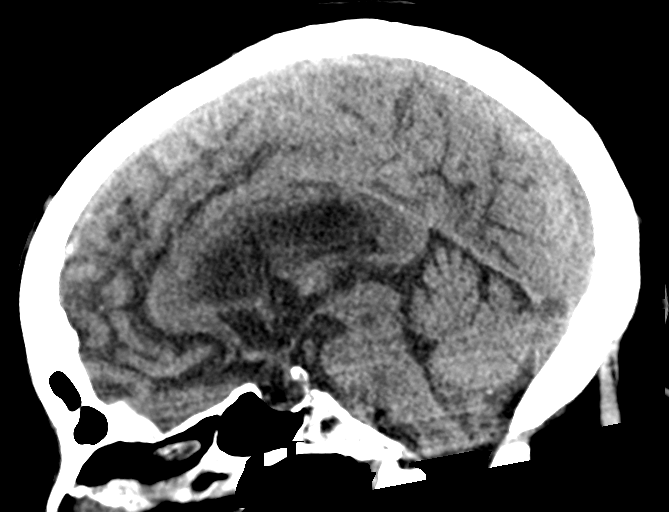
[im 33/49  brain]
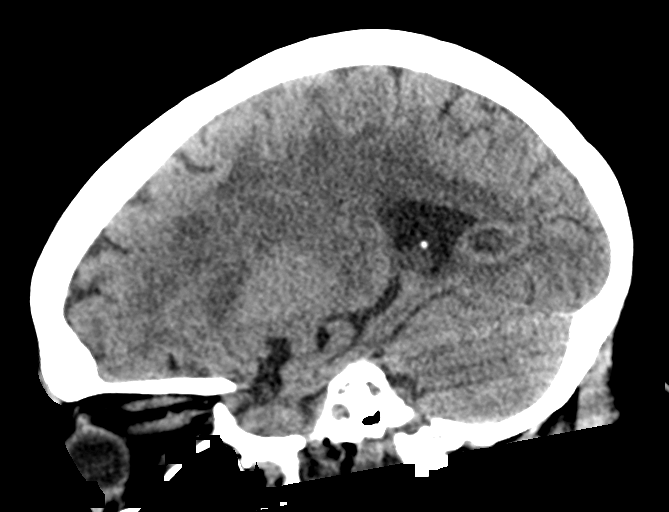

[16 of 47 positions shown; findings below may reference images not displayed]

FINDINGS: Brain: No evidence of acute infarction, hemorrhage, hydrocephalus,
extra-axial collection or mass lesion/mass effect. Extensive
low-density changes within the periventricular and subcortical white
matter compatible with chronic microvascular ischemic change. Mild
diffuse cerebral volume loss.

Vascular: Atherosclerotic calcifications involving the large vessels
of the skull base. No unexpected hyperdense vessel.

Skull: Normal. Negative for fracture or focal lesion.

Sinuses/Orbits: No acute finding.

Other: Negative for scalp hematoma.
IMPRESSION: 1. No acute intracranial findings.
2. Chronic microvascular ischemic change and cerebral volume loss.
